# Patient Record
Sex: Female | Born: 1960
Health system: Southern US, Community
[De-identification: ages and names within clinical notes are randomized; demographics above are authoritative.]

## PROBLEM LIST (undated history)

## (undated) DIAGNOSIS — T7840XA Allergy, unspecified, initial encounter: Secondary | ICD-10-CM

## (undated) DIAGNOSIS — R634 Abnormal weight loss: Secondary | ICD-10-CM

## (undated) DIAGNOSIS — Z87442 Personal history of urinary calculi: Secondary | ICD-10-CM

## (undated) DIAGNOSIS — F32A Depression, unspecified: Secondary | ICD-10-CM

## (undated) DIAGNOSIS — F319 Bipolar disorder, unspecified: Secondary | ICD-10-CM

## (undated) DIAGNOSIS — K5900 Constipation, unspecified: Secondary | ICD-10-CM

## (undated) DIAGNOSIS — G5600 Carpal tunnel syndrome, unspecified upper limb: Secondary | ICD-10-CM

## (undated) DIAGNOSIS — K589 Irritable bowel syndrome without diarrhea: Secondary | ICD-10-CM

## (undated) DIAGNOSIS — E785 Hyperlipidemia, unspecified: Secondary | ICD-10-CM

## (undated) DIAGNOSIS — I10 Essential (primary) hypertension: Secondary | ICD-10-CM

## (undated) DIAGNOSIS — Z961 Presence of intraocular lens: Secondary | ICD-10-CM

## (undated) DIAGNOSIS — F419 Anxiety disorder, unspecified: Secondary | ICD-10-CM

## (undated) DIAGNOSIS — R945 Abnormal results of liver function studies: Secondary | ICD-10-CM

## (undated) DIAGNOSIS — M810 Age-related osteoporosis without current pathological fracture: Secondary | ICD-10-CM

## (undated) DIAGNOSIS — F329 Major depressive disorder, single episode, unspecified: Secondary | ICD-10-CM

## (undated) DIAGNOSIS — C801 Malignant (primary) neoplasm, unspecified: Secondary | ICD-10-CM

## (undated) DIAGNOSIS — R39198 Other difficulties with micturition: Secondary | ICD-10-CM

## (undated) DIAGNOSIS — K219 Gastro-esophageal reflux disease without esophagitis: Secondary | ICD-10-CM

## (undated) DIAGNOSIS — R079 Chest pain, unspecified: Secondary | ICD-10-CM

## (undated) DIAGNOSIS — M1990 Unspecified osteoarthritis, unspecified site: Secondary | ICD-10-CM

## (undated) DIAGNOSIS — I619 Nontraumatic intracerebral hemorrhage, unspecified: Secondary | ICD-10-CM

## (undated) DIAGNOSIS — I639 Cerebral infarction, unspecified: Secondary | ICD-10-CM

## (undated) HISTORY — DX: Irritable bowel syndrome, unspecified: K58.9

## (undated) HISTORY — DX: Abnormal weight loss: R63.4

## (undated) HISTORY — DX: Allergy, unspecified, initial encounter: T78.40XA

## (undated) HISTORY — DX: Depression, unspecified: F32.A

## (undated) HISTORY — DX: Nontraumatic intracerebral hemorrhage, unspecified: I61.9

## (undated) HISTORY — PX: TOTAL ABDOMINAL HYSTERECTOMY: SHX209

## (undated) HISTORY — DX: Hyperlipidemia, unspecified: E78.5

## (undated) HISTORY — DX: Gastro-esophageal reflux disease without esophagitis: K21.9

## (undated) HISTORY — DX: Chest pain, unspecified: R07.9

## (undated) HISTORY — DX: Anxiety disorder, unspecified: F41.9

## (undated) HISTORY — DX: Major depressive disorder, single episode, unspecified: F32.9

## (undated) HISTORY — PX: ABDOMINAL HYSTERECTOMY: SHX81

## (undated) HISTORY — DX: Other difficulties with micturition: R39.198

## (undated) HISTORY — PX: RECTOCELE REPAIR: SHX761

## (undated) HISTORY — DX: Malignant (primary) neoplasm, unspecified: C80.1

## (undated) HISTORY — PX: CERVICAL FUSION: SHX112

## (undated) HISTORY — DX: Abnormal results of liver function studies: R94.5

---

## 1997-07-04 DIAGNOSIS — I639 Cerebral infarction, unspecified: Secondary | ICD-10-CM

## 1997-07-04 HISTORY — DX: Cerebral infarction, unspecified: I63.9

## 1997-07-04 HISTORY — PX: BRAIN SURGERY: SHX531

## 1997-10-10 ENCOUNTER — Inpatient Hospital Stay (HOSPITAL_COMMUNITY): Admission: EM | Admit: 1997-10-10 | Discharge: 1997-10-14 | Payer: Self-pay | Admitting: Neurosurgery

## 1997-10-14 ENCOUNTER — Inpatient Hospital Stay (HOSPITAL_COMMUNITY)
Admission: RE | Admit: 1997-10-14 | Discharge: 1997-10-29 | Payer: Self-pay | Admitting: Physical Medicine and Rehabilitation

## 1997-12-02 ENCOUNTER — Ambulatory Visit (HOSPITAL_COMMUNITY): Admission: RE | Admit: 1997-12-02 | Discharge: 1997-12-02 | Payer: Self-pay | Admitting: Neurosurgery

## 1998-05-31 ENCOUNTER — Ambulatory Visit (HOSPITAL_COMMUNITY): Admission: RE | Admit: 1998-05-31 | Discharge: 1998-05-31 | Payer: Self-pay | Admitting: Neurosurgery

## 1998-05-31 ENCOUNTER — Encounter: Payer: Self-pay | Admitting: Neurosurgery

## 1998-06-05 ENCOUNTER — Encounter: Payer: Self-pay | Admitting: Neurosurgery

## 1998-06-05 ENCOUNTER — Ambulatory Visit (HOSPITAL_COMMUNITY): Admission: RE | Admit: 1998-06-05 | Discharge: 1998-06-05 | Payer: Self-pay | Admitting: Neurosurgery

## 1998-11-26 ENCOUNTER — Ambulatory Visit: Admission: RE | Admit: 1998-11-26 | Discharge: 1998-11-26 | Payer: Self-pay | Admitting: Neurosurgery

## 1998-12-01 ENCOUNTER — Emergency Department (HOSPITAL_COMMUNITY): Admission: EM | Admit: 1998-12-01 | Discharge: 1998-12-01 | Payer: Self-pay | Admitting: Emergency Medicine

## 1998-12-01 ENCOUNTER — Encounter: Payer: Self-pay | Admitting: Emergency Medicine

## 1999-10-28 ENCOUNTER — Ambulatory Visit (HOSPITAL_COMMUNITY): Admission: RE | Admit: 1999-10-28 | Discharge: 1999-10-28 | Payer: Self-pay | Admitting: Obstetrics and Gynecology

## 1999-10-28 ENCOUNTER — Encounter (INDEPENDENT_AMBULATORY_CARE_PROVIDER_SITE_OTHER): Payer: Self-pay

## 2003-04-24 ENCOUNTER — Inpatient Hospital Stay (HOSPITAL_COMMUNITY): Admission: RE | Admit: 2003-04-24 | Discharge: 2003-04-25 | Payer: Self-pay | Admitting: Neurosurgery

## 2003-04-24 ENCOUNTER — Encounter: Payer: Self-pay | Admitting: Neurosurgery

## 2004-01-29 ENCOUNTER — Encounter: Admission: RE | Admit: 2004-01-29 | Discharge: 2004-01-29 | Payer: Self-pay | Admitting: Internal Medicine

## 2004-02-12 ENCOUNTER — Other Ambulatory Visit: Admission: RE | Admit: 2004-02-12 | Discharge: 2004-02-12 | Payer: Self-pay | Admitting: Internal Medicine

## 2005-03-10 ENCOUNTER — Other Ambulatory Visit: Admission: RE | Admit: 2005-03-10 | Discharge: 2005-03-10 | Payer: Self-pay | Admitting: Family Medicine

## 2005-03-14 ENCOUNTER — Encounter: Admission: RE | Admit: 2005-03-14 | Discharge: 2005-03-14 | Payer: Self-pay | Admitting: Family Medicine

## 2005-06-08 ENCOUNTER — Encounter: Admission: RE | Admit: 2005-06-08 | Discharge: 2005-06-08 | Payer: Self-pay | Admitting: Obstetrics and Gynecology

## 2005-06-13 ENCOUNTER — Encounter: Admission: RE | Admit: 2005-06-13 | Discharge: 2005-06-13 | Payer: Self-pay | Admitting: Family Medicine

## 2005-09-06 ENCOUNTER — Other Ambulatory Visit: Admission: RE | Admit: 2005-09-06 | Discharge: 2005-09-06 | Payer: Self-pay | Admitting: Obstetrics and Gynecology

## 2006-01-03 ENCOUNTER — Ambulatory Visit (HOSPITAL_COMMUNITY): Admission: RE | Admit: 2006-01-03 | Discharge: 2006-01-03 | Payer: Self-pay | Admitting: Obstetrics and Gynecology

## 2006-03-20 ENCOUNTER — Other Ambulatory Visit: Admission: RE | Admit: 2006-03-20 | Discharge: 2006-03-20 | Payer: Self-pay | Admitting: Obstetrics and Gynecology

## 2006-07-17 ENCOUNTER — Ambulatory Visit (HOSPITAL_COMMUNITY): Admission: RE | Admit: 2006-07-17 | Discharge: 2006-07-17 | Payer: Self-pay | Admitting: Obstetrics and Gynecology

## 2006-10-26 ENCOUNTER — Ambulatory Visit (HOSPITAL_COMMUNITY): Admission: RE | Admit: 2006-10-26 | Discharge: 2006-10-26 | Payer: Self-pay | Admitting: Family Medicine

## 2007-03-19 ENCOUNTER — Other Ambulatory Visit: Admission: RE | Admit: 2007-03-19 | Discharge: 2007-03-19 | Payer: Self-pay | Admitting: Obstetrics and Gynecology

## 2007-06-14 ENCOUNTER — Ambulatory Visit (HOSPITAL_COMMUNITY): Admission: RE | Admit: 2007-06-14 | Discharge: 2007-06-14 | Payer: Self-pay | Admitting: Obstetrics and Gynecology

## 2007-06-21 ENCOUNTER — Inpatient Hospital Stay (HOSPITAL_COMMUNITY): Admission: RE | Admit: 2007-06-21 | Discharge: 2007-06-22 | Payer: Self-pay | Admitting: Obstetrics and Gynecology

## 2007-06-21 ENCOUNTER — Encounter: Payer: Self-pay | Admitting: Obstetrics and Gynecology

## 2008-06-18 ENCOUNTER — Ambulatory Visit (HOSPITAL_COMMUNITY): Admission: RE | Admit: 2008-06-18 | Discharge: 2008-06-18 | Payer: Self-pay | Admitting: Obstetrics and Gynecology

## 2009-06-19 ENCOUNTER — Ambulatory Visit (HOSPITAL_COMMUNITY): Admission: RE | Admit: 2009-06-19 | Discharge: 2009-06-19 | Payer: Self-pay | Admitting: Obstetrics and Gynecology

## 2009-07-02 ENCOUNTER — Ambulatory Visit (HOSPITAL_COMMUNITY): Admission: RE | Admit: 2009-07-02 | Discharge: 2009-07-02 | Payer: Self-pay | Admitting: Obstetrics and Gynecology

## 2010-08-25 ENCOUNTER — Other Ambulatory Visit: Payer: Self-pay | Admitting: Obstetrics and Gynecology

## 2010-08-25 DIAGNOSIS — Z139 Encounter for screening, unspecified: Secondary | ICD-10-CM

## 2010-08-26 ENCOUNTER — Ambulatory Visit (HOSPITAL_COMMUNITY)
Admission: RE | Admit: 2010-08-26 | Discharge: 2010-08-26 | Disposition: A | Discharge status: Home / Self Care | Payer: 59 | Source: Ambulatory Visit | Attending: Obstetrics and Gynecology | Admitting: Obstetrics and Gynecology

## 2010-08-26 DIAGNOSIS — Z139 Encounter for screening, unspecified: Secondary | ICD-10-CM

## 2010-08-26 DIAGNOSIS — Z1231 Encounter for screening mammogram for malignant neoplasm of breast: Secondary | ICD-10-CM | POA: Insufficient documentation

## 2010-11-16 NOTE — H&P (Signed)
Diamond, Santiago NO.:  [id number]   MEDICAL RECORD NO.:  [id number]          PATIENT TYPE:  AMB   LOCATION:  DAY                           FACILITY:  [facility]   PHYSICIAN:  Jonnie Kind, M.D. DATE OF BIRTH:  Oct 18, 1960   DATE OF ADMISSION:  DATE OF DISCHARGE:  LH                              HISTORY & PHYSICAL   For admission to [facility] June 21, 2007.   ADMITTING DIAGNOSES:  1. Dysmenorrhea, status post failed endometrial ablation.  2. History of recurrent abnormal Pap smear grade 1 despite rectocele.   HISTORY OF PRESENT ILLNESS:  This is a 50 year old, nulliparous female  who is admitted for vaginal hysterectomy and posterior repair.  She has  incapacitating dysmenorrhea despite the fact that we did an endometrial  ablation a couple years ago which shortened her periods but still left  her with 2 days of incapacitating pain at the beginning of each period.  She also has a very pronounced rectocele despite the face she has never  delivered vaginally and strains a lot for defecation due to the position  of the stool.  She splints the perineum to help with evacuation but  wishes to eliminate this.  Rectocele has been reviewed as a helpful  corrective procedure.  Absolute guarantees cannot be given as to how  exactly how effective the rectocele repair will be.  Family history is  significant for an aunt with ovarian cancer and no other family members  with a history of ovarian cancer and no breast cancer patterns in her  family.  Ovary preservation is planned.   PAST MEDICAL HISTORY:  History of colitis.  Also a history of left  hemiparetic stroke in 1997 with full recovery.  The paralysis effected  the left side of the body and as stated, has recovered.   SURGICAL HISTORY:  Negative other than a tubal ligation and endometrial  ablation.   ALLERGIES:  None but says she cannot tolerate PHENERGAN as it makes her  agitated.    MEDICATIONS:  1. Effexor.  2. Wellbutrin.  3. Lorazepam 0.5 mg q.a.m.  4. Trazodone at bedtime.  5. She takes a blood pressure medicine and cholesterol medicine, names      unknown.  The patient is instructed to bring these to the hospital.   PHYSICAL EXAMINATION:  VITAL SIGNS:  Weight 140.6, blood pressure  134/72.  HEENT:  Pupils equal, round, and reactive.  Extraocular movements  intact.  NECK:  Supple.  CHEST:  Clear to auscultation.  ABDOMEN:  Nontender.  EXTERNAL GENITALIA:  Nulliparous female with __________  relaxation  considered adequate to access the small uterus per vagina.  Rectocele is  present above a normal sphincter with digital rectal exam showing the  rectocele to greater than 90 degrees.   IMPRESSION:  1. Pelvic pain dysmenorrhea, status post failed endometrial ablation.  2. Rectocele.  3. Status post stroke 1997 secondary to right cranial hemorrhage with      left hemiparesis and full recovery.   PLAN:  Vaginal hysterectomy and posterior repair June 21, 2007.  Jonnie Kind, M.D.  Electronically Signed     JVF/MEDQ  D:  06/18/2007  T:  06/19/2007  Job:  [id number]

## 2010-11-16 NOTE — Op Note (Signed)
[name], [name]              ACCOUNT NO.:  [id number]   MEDICAL RECORD NO.:  [id number]          PATIENT TYPE:  INP   LOCATION:  A312                          FACILITY:  [facility]   PHYSICIAN:  Jonnie Kind, M.D. DATE OF BIRTH:  04/02/1961   DATE OF PROCEDURE:  06/21/2007  DATE OF DISCHARGE:                               OPERATIVE REPORT   PREOPERATIVE DIAGNOSES:  1. Dysmenorrhea status post failed endometrial ablation.  2. Rectocele.   POSTOPERATIVE DIAGNOSES:  1. Dysmenorrhea status post failed endometrial ablation.  2. Rectocele.   PROCEDURE:  Vaginal hysterectomy, posterior repair.   SURGEON:  Jonnie Kind, MD   ASSISTANT:  None.   ANESTHESIA:  General.   COMPLICATIONS:  None.   FINDINGS:  Small uterus, well supported.  Tendency to ooze on the  posterior vaginal cuff, controlled.  Rectocele.   DESCRIPTION OF PROCEDURE:  The patient was taken to the operating room,  prepped and draped for vaginal procedure with legs supported in high  lithotomy candy cane stirrups.  The cervix was circumscribed after  prepping and draping using Bovie cautery after having infiltrated  beneath the bladder with Marcaine with epinephrine.  The posterior  colpotomy incision was performed, identifying the posterior cul-de-sac.  The weighted speculum was placed into the vagina, the 3-inch, and the  uterosacral ligaments were taken down separately, clamped, cut, and  suture ligated, and tagged for future orientation.  The lower and upper  cardinal ligaments were then serially clamped, cut, and suture ligated  on either side of the bladder flap and then elevated.  The vesicouterine  peritoneum was not entered until reaching the level of the upper  cardinal ligaments.  We then marched up the broad ligament serially,  clamping, cutting, and suture ligating only the side.  Hemostasis was  quite good during this portion of the case.  Pedicles involving the  round ligament, fallopian tube,  and ovary were hemostatic.  Long  weighted speculum was replaced with a shorter speculum at this point and  the vaginal cuff noted some tendency to ooze that was addressed during  cuff closure.  First the anterior bladder flap peritoneum was tacked x1  stitch to the back of the vaginal cuff just above the cuff itself, and  the rest of the vaginal cuff was closed with interrupted suture sites in  the sagittal plane with good tissue edge approximation and support.  EBL  100 mL.  Sponge and needle counts correct.   Posterior fourchette was then grasped with Allis clamps, elevated, and  an incision made just inside the hymen remnants, elevating the vaginal  mucosa.  This was split in the midline for a distance of about 3 cm, and  while maintaining the operator's right index finger double-gloved in the  rectum, we were able to elevate the vaginal mucosa and dissect laterally  sharply to mobilize the vaginal mucosa.  Allis clamps were used to  identify good supportive tissues on either side.  The weakness appeared  to be on the right side of the midline and more tissue could be  identified on the  left side of the dissection.  A series of 3  interrupted mattress sutures of 0 Dexon were used to hold the  paravaginal supportive tissues together, rebuilding perineal body and  reducing the rectocele bulge.  After confirmation that at no time was  there any suspicion of bowel injury, the right index finger was  extracted using sterile technique, gloves changed, and then we proceeded  with closing vaginal mucosa without any trimming of tissue of the  vaginal mucosa itself, using interrupted 2-0 Prolene sutures.  Hemostasis at this point was excellent and not packing was necessary in  the vagina or perineum.  Foley catheter was inserted, obtaining some  somewhat cloudy urine.  The patient received antibiotics preprocedure.  Will be kept overnight with pain medicines ordered.      Jonnie Kind,  M.D.  Electronically Signed     JVF/MEDQ  D:  06/21/2007  T:  06/21/2007  Job:  [id number]   cc:   [facility]

## 2010-11-19 NOTE — H&P (Signed)
[name], BRANIFF              ACCOUNT NO.:  [id number]   MEDICAL RECORD NO.:  [id number]          PATIENT TYPE:  AMB   LOCATION:  [place]                           FACILITY:  [place]   PHYSICIAN:  Christophe Louis, MD          DATE OF BIRTH:  1961-01-15   DATE OF ADMISSION:  01/03/2006  DATE OF DISCHARGE:                                HISTORY & PHYSICAL   HISTORY OF PRESENT ILLNESS:  This is a 50 year old, G0, with a history of  menorrhagia.  She has a history of CVA and is not a candidate for any  estrogen therapy.  Discussed progestin therapies as well as a Mirena IUD and  ablation.  The patient desires endometrial ablation with ThermaChoice.   GYNECOLOGIC HISTORY:  History of condyloma and also history of abnormal Pap  smears, CIN-1.   PAST MEDICAL HISTORY:  1.  Hypertension.  2.  Hypercholesterolemia.  3.  Anxiety.  4.  History of stroke.  5.  CVA with residual left-sided weakness.   PAST SURGICAL HISTORY:  1.  Neck surgery in August 2005.  2.  Tube ligation in the 1980s.  3.  Cranial surgery for drainage of hematoma 1998.   MEDICATIONS:  1.  Hydrochlorothiazide 25 mg p.o. q.d.  2.  Altace 2.5 one p.o. q.d.  3.  Trazodone 50 mg one at bedtime.  4.  Lipitor  mg one daily.  5.  Effexor 150 mg one per day.  6.  Lorazepam 0.5 mg one daily.  7.  Potassium supplement.  8.  Wellbutrin 300 mg daily.   ALLERGIES:  PHENERGAN.   SOCIAL HISTORY:  The patient is married.  She denies tobacco, alcohol, or  illicit drug use.   FAMILY HISTORY:  No family history of breast, ovarian, or colon cancer.   REVIEW OF SYSTEMS:  Negative except as stated in history of current illness.   PHYSICAL EXAMINATION:  VITAL SIGNS: Blood pressure was 116/86, heart rate  80.  CARDIOVASCULAR: Regular rate and rhythm.  LUNGS: Clear to auscultation bilaterally.  ABDOMEN:  Soft, nontender, nondistended.  No masses.  EXTREMITIES:  No clubbing, cyanosis or edema.  2+ reflexes.  PELVIC EXAM:  Normal external  female genitalia.  No vulvar or vaginal  cervical lesions noted on bimanual exam. No cervical motion tenderness.  No  adnexal masses or tenderness.   LAB RESULTS:  Endometrial biopsy done in my office in November 2006 showed  benign secretory endometrium. No hyperplasia or malignancy was identified.   IMPRESSION AND PLAN:  Menorrhagia. Plan for endometrial ablation using  ThermaChoice. The risks, benefits, and alternatives of the procedure were  discussed with the patient including but not limited to infection, bleeding,  damage to the uterus, perforation, with the need for further surgery, the  risk of transfusion, HIV, hepatitis B and C were discussed.  The patient  understands all risks and desires to proceed with endometrial ablation with  ThermaChoice.      Christophe Louis, MD  Electronically Signed     TC/MEDQ  D:  12/22/2005  T:  12/22/2005  Job:  [id number]

## 2010-11-19 NOTE — Discharge Summary (Signed)
NAMEENID, MAULTSBY              ACCOUNT NO.:  [id number]   MEDICAL RECORD NO.:  [id number]          PATIENT TYPE:  INP   LOCATION:  A312                          FACILITY:  [facility]   PHYSICIAN:  Jonnie Kind, M.D. DATE OF BIRTH:  09-Dec-1960   DATE OF ADMISSION:  06/21/2007  DATE OF DISCHARGE:  12/19/2008LH                               DISCHARGE SUMMARY   ADMISSION DIAGNOSES:  1. Dysmenorrhea, rectocele, failed endometrial ablation.  2. History of cranial hemorrhage 1997.   DISCHARGE DIAGNOSES:  1. Dysmenorrhea, rectocele, failed endometrial ablation.  2. History of cranial hemorrhage 1997.  3. Depression, anxiety disorder.   PROCEDURE:  On June 21, 2007, vaginal hysterectomy and posterior  repair.   HISTORY OF PRESENT ILLNESS:  This 46-year gravida 0, para 0 was admitted  for vaginal hysterectomy, posterior repair after incapacitating  dysmenorrhea, treated 2 years ago with endometrial ablation with  reduction of menstrual flow but no significant change in pelvic pain.   HOSPITAL COURSE:  The patient was admitted, underwent vaginal  hysterectomy, posterior repair with 100 mL blood loss.  The patient had  a straightforward 24-hour postop observation with discharge hemoglobin  12.6, hematocrit 36.5 compared to 13.3 and 39 on admission.  BUN was 12,  creatinine 0.82, a GFR greater than 60.   Pathology report showed a 51-gram uterus, benign secretory endometrium,  indicating incomplete endometrial ablation results.  There was no  evidence of adenomyosis documented.  Postoperatively the patient was  discharged home, resumed her prior med list, and will be followed up in  1 week and then 4 weeks in our office.  With the patient noting urinary  retention day one, was sent home with a Foley catheter to be kept in for  48 hours and then removed at home.   DISCHARGE MEDICATIONS:  1. Effexor 75 mg twice daily.  2. Wellbutrin 300 mg daily.  3. Lorazepam 0.5 mg in the a.m.  daily.  4. Vitamin E daily, multivitamin daily, vitamin B complex daily.  5. Trazodone 25 mg q.h.s.  6. Lisinopril 20 mg daily.  7. Flexeril 10 mg p.r.n. discomfort and muscle spasm.  8. Meloxicam 15 mg daily.  9. Simvastatin 20 mg q.h.s.   Additional medications given included:  1. Toradol 10 mg every 6 hours p.r.n. cramps.  2. Percocet 5/325 one every six hours p.r.n. pain, dispensed 20.  3. Colace 100 mg b.i.d. x 30 days.      Jonnie Kind, M.D.  Electronically Signed     JVF/MEDQ  D:  07/12/2007  T:  07/12/2007  Job:  [id number]

## 2010-11-19 NOTE — Op Note (Signed)
[facility]  Patient:    [name], [name]                       MRN: [id number] Proc. Date: 10/28/99 Adm. Date:  [id number] Attending:  Richarda Blade                           Operative Report  PREOPERATIVE DIAGNOSIS:       Right Bartholins gland cyst.  POSTOPERATIVE DIAGNOSIS:  OPERATION:                    Marsupialization of right Bartholins gland cyst.  SURGEON:                      S. Olena Mater, M.D.  ASSISTANT:  ANESTHESIA:  ESTIMATED BLOOD LOSS:  DESCRIPTION OF PROCEDURE:     The patient was placed in the lithotomy position nd prepped and draped in the usual fashion.  A small Bartholins cyst was identified. I attempted to enter the cyst through the vaginal aspect and was unable to find the cyst wall satisfactorily, so I made an incision at the vaginal labial junction nd found the cyst quite easily.  I opened the cyst and drained it vaginally as well as at the labial opening.  Part of the cyst wall was sent for study.  The vaginal kin was closed with interrupted sutures of 4-0 Vicryl.  Hemostasis was accomplished  with 3-0 and 4-0 Vicryl as well as Bovie.  Versa tolerated this procedure well nd injected about 10 cc of Marcaine in the incision. DD:  10/28/99 TD:  10/29/99 Job: [id number]/[id number]

## 2010-11-19 NOTE — Op Note (Signed)
[name], VANROSSUM                        ACCOUNT NO.:  [id number]   MEDICAL RECORD NO.:  [id number]                   PATIENT TYPE:  INP   LOCATION:  2899                                 FACILITY:  [place]   PHYSICIAN:  Hosie Spangle, M.D.            DATE OF BIRTH:  1961/05/29   DATE OF PROCEDURE:  04/24/2003  DATE OF DISCHARGE:                                 OPERATIVE REPORT   PREOPERATIVE DIAGNOSIS:  C5-6 cervical disk herniation, degenerative disk  disease, spondylosis and radiculopathy.   POSTOPERATIVE DIAGNOSIS:  C5-6 cervical disk herniation, degenerative disk  disease, spondylosis and radiculopathy.   OPERATION PERFORMED:  C5-6 anterior cervical diskectomy and arthrodesis with  iliac crest allograft and Trinica cervical plating.   SURGEON:  Hosie Spangle, M.D.   ASSISTANT:  Earleen Newport, M.D.   ANESTHESIA:  General endotracheal.   INDICATIONS FOR PROCEDURE:  The patient is a 50 year old woman who presented  with neck pain and cervical radiculopathy.  She was found to have a  spondylitic disk herniation at C5-6 superimposed upon underlying  degenerative disk disease and spondylosis.  Decision was made to proceed  with decompression and arthrodesis.   DESCRIPTION OF PROCEDURE:  The patient was brought to the operating room and  placed under general endotracheal anesthesia.  The patient was placed in 10  pounds of halter traction.  The neck was prepped with Betadine soap and  solution and draped in sterile fashion.  A horizontal incision was made on  the left side of the neck.  The line of incision was infiltrated with local  anesthetic with epinephrine.  Incision was made and carried down through the  subcutaneous tissue, bipolar cautery and electrocautery were used to  maintain hemostasis.  Dissection was carried down to the platysma which was  divided and then through an avascular plane leaving the sternocleidomastoid  carotid artery and jugular  vein laterally and the trachea and esophagus  medially.  The ventral aspect of the vertebral column was identified and  localizing x-ray taken.  The C5-6 intervertebral disk space identified.  Diskectomy was begun with incision of the annulus, continued with  microcurettes and pituitary rongeurs.  The cartilaginous end plates of the  corresponding vertebrae were removed using microcurettes as well as the  MicroMax drill.  The operating microscope was draped and brought into the  field to provide additional magnification, illumination and visualization  and the remainder of the decompression was performed using microdissection  and microsurgical technique.  Posterior osteophytic overgrowth was removed  using the MicroMax drill along with a 2 mm Kerrison punch with a thin foot  plate.  The posterior longitudinal ligament which was thickened was  carefully removed and the disk herniation was removed.  We then  removed  spondylitic overgrowth encroaching upon the foramina bilaterally and we were  able to decompress the thecal sac and spinal cord as well as the foramina  and nerve roots bilaterally.  Once the decompression was completed, Gelfoam  with Thrombin was used to help establish hemostasis and then we selected an  8 mm graft of iliac crest tricortical allograft.  The edges were slightly  shaved down and we were able to position the intervertebral graft in the  disk space.  We had sized the disk space prior to selecting the graft.  The  graft itself had been hydrated in saline.  Once the graft was positioned and  countersunk, we selected a 24 mm Trinica cervical plate.  This was  positioned over the fusion construct and secured to each of the vertebrae  with a pair of 4.2 x 14 mm screws. Each screw hole was drilled and tapped  and the screws placed in alternating fashion.  All four screws were placed  and the locking system was secured.  We then irrigated the wound with  bacitracin  solution and checked for hemostasis which was established and  confirmed and proceeded with closure. An x-ray was taken which showed the  graft, plate and screws in good position.  The alignment was good.  The  platysma was closed with interrupted inverted 2-0 undyed Vicryl sutures and  the subcutaneous and subcuticular layer were closed with interrupted  inverted 3-0 undyed Vicryl sutures and the skin edges were approximated with  Dermabond.  The patient tolerated the procedure well.  The estimated blood  loss for this procedure was 44m.  Sponge and instrument counts were  correct.  Following surgery, the patient was to be reversed from anesthetic,  extubated and transferred to the recovery room for further care.  She was  placed in a soft collar and in the recovery room she was noted to be moving  all four extremities to command.                                                RHosie Spangle M.D.    RWN/MEDQ  D:  04/24/2003  T:  04/24/2003  Job:  [id number]

## 2010-11-19 NOTE — Op Note (Signed)
[name], [name]              ACCOUNT NO.:  [id number]   MEDICAL RECORD NO.:  [id number]          PATIENT TYPE:  AMB   LOCATION:  [place]                           FACILITY:  [place]   PHYSICIAN:  Christophe Louis, MD          DATE OF BIRTH:  07-Oct-1960   DATE OF PROCEDURE:  01/03/2006  DATE OF DISCHARGE:                                 OPERATIVE REPORT   PREOPERATIVE DIAGNOSIS:  Menorrhagia.   POSTOPERATIVE DIAGNOSIS:  Menorrhagia.   PROCEDURE:  ThermaChoice endometrial ablation.   SURGEON:  Christophe Louis, M.D.   ASSISTANT:  None.   ANESTHESIA:  General.   SPECIMEN:  None.   ESTIMATED BLOOD LOSS:  Minimal.   COMPLICATIONS:  None.   FINDINGS:  Uterus sounded to approximately 8 cm.   PROCEDURE:  The patient was taken to the operating room where she was placed  under general anesthesia, placed in dorsal lithotomy position.  She was then  prepped in the usual sterile fashion.  Bladder was drained with in-and-out  catheter.  She was then draped using sterile fashion.  Bivalve speculum  placed in the vaginal vault.  The anterior lip of the cervix was grasped  with single-tooth vacuum.  The uterus was sounded to approximately 8 cm.  The endometrial ablation was performed after the catheter was primed  negative 150.  It was then inserted into the uterus to approximately 7.5 cm.  Pressure was obtained using D5W to 160.  This stabilized and the ablation  was performed at 87 degrees Celsius for eight minutes.  Once that the  therapy cycle was completed, the catheter was allowed to cool down and was  then removed from the patient's uterus and all other instruments were  removed from the vaginal vault.  Excellent hemostasis was noted.  The  patient was taken to recovery room awake and in stable condition.      Christophe Louis, MD  Electronically Signed     TC/MEDQ  D:  01/03/2006  T:  01/03/2006  Job:  [id number]

## 2010-11-26 ENCOUNTER — Other Ambulatory Visit (HOSPITAL_COMMUNITY): Payer: 59

## 2010-12-03 ENCOUNTER — Other Ambulatory Visit: Payer: Self-pay | Admitting: Obstetrics and Gynecology

## 2010-12-03 ENCOUNTER — Encounter (HOSPITAL_COMMUNITY): Payer: 59

## 2010-12-03 LAB — CBC
HCT: 42.1 % (ref 36.0–46.0)
Hemoglobin: 14.5 g/dL (ref 12.0–15.0)
MCH: 32 pg (ref 26.0–34.0)
MCHC: 34.4 g/dL (ref 30.0–36.0)
MCV: 92.9 fL (ref 78.0–100.0)
Platelets: 273 10*3/uL (ref 150–400)
RBC: 4.53 MIL/uL (ref 3.87–5.11)
RDW: 12.1 % (ref 11.5–15.5)
WBC: 5.3 10*3/uL (ref 4.0–10.5)

## 2010-12-03 LAB — DIFFERENTIAL
Basophils Absolute: 0 10*3/uL (ref 0.0–0.1)
Basophils Relative: 0 % (ref 0–1)
Eosinophils Absolute: 0.1 10*3/uL (ref 0.0–0.7)
Eosinophils Relative: 2 % (ref 0–5)
Lymphocytes Relative: 32 % (ref 12–46)
Lymphs Abs: 1.7 10*3/uL (ref 0.7–4.0)
Monocytes Absolute: 0.4 10*3/uL (ref 0.1–1.0)
Monocytes Relative: 8 % (ref 3–12)
Neutro Abs: 3.1 10*3/uL (ref 1.7–7.7)
Neutrophils Relative %: 58 % (ref 43–77)

## 2010-12-03 LAB — BASIC METABOLIC PANEL
BUN: 18 mg/dL (ref 6–23)
CO2: 31 mEq/L (ref 19–32)
Calcium: 11.1 mg/dL — ABNORMAL HIGH (ref 8.4–10.5)
Chloride: 101 mEq/L (ref 96–112)
Creatinine, Ser: 1.03 mg/dL (ref 0.4–1.2)
GFR calc Af Amer: >60 mL/min (ref >60)
GFR calc non Af Amer: 57 mL/min — ABNORMAL LOW (ref >60)
Glucose, Bld: 94 mg/dL (ref 70–99)
Potassium: 4.4 mEq/L (ref 3.5–5.1)
Sodium: 140 mEq/L (ref 135–145)

## 2010-12-03 LAB — SURGICAL PCR SCREEN
MRSA, PCR: NEGATIVE
Staphylococcus aureus: NEGATIVE

## 2010-12-08 ENCOUNTER — Ambulatory Visit (HOSPITAL_COMMUNITY)
Admission: RE | Admit: 2010-12-08 | Discharge: 2010-12-08 | Disposition: A | Discharge status: Home / Self Care | Payer: 59 | Source: Ambulatory Visit | Attending: Obstetrics and Gynecology | Admitting: Obstetrics and Gynecology

## 2010-12-08 DIAGNOSIS — N816 Rectocele: Secondary | ICD-10-CM | POA: Insufficient documentation

## 2010-12-08 DIAGNOSIS — Z79899 Other long term (current) drug therapy: Secondary | ICD-10-CM | POA: Insufficient documentation

## 2010-12-08 DIAGNOSIS — I10 Essential (primary) hypertension: Secondary | ICD-10-CM | POA: Insufficient documentation

## 2010-12-08 DIAGNOSIS — Z01812 Encounter for preprocedural laboratory examination: Secondary | ICD-10-CM | POA: Insufficient documentation

## 2010-12-08 DIAGNOSIS — Z01818 Encounter for other preprocedural examination: Secondary | ICD-10-CM | POA: Insufficient documentation

## 2010-12-08 NOTE — H&P (Signed)
  [name], [name] NO.:  [id number]  MEDICAL RECORD NO.:  [id number]  LOCATION:  [place]                          FACILITY:  [facility]  PHYSICIAN:  Jonnie Kind, M.D. DATE OF BIRTH:  09-02-1960  DATE OF ADMISSION:  12/08/2010 DATE OF DISCHARGE:  LH                             HISTORY & PHYSICAL   ADMISSION DIAGNOSES: 1. Recurrent rectocele. 2. History of cranial hemorrhage in 1997 without sequelae.  HISTORY OF PRESENT ILLNESS:  This 50 year old female status post vaginal hysterectomy and posterior repair on June 21, 2007 is admitted at this time for recurrent repair of rectocele.  There has been posterior rotation and descent of the posterior rectal vault which is requiring the patient to use stool softeners and MiraLax and still having prolonged effort to defecate.  This has been followed since 2011 and after consideration, the patient wishes to have [place] reattempt repair as the symptoms have become more inconvenient.  She originally considered having it done last year and decided to hold off.  The patient is aware that surgery will be technically more challenging due to the presence of scar tissue from prior procedure.  The risks of the procedure including vaginal length loss, potential for injury to bowel including potential of entering the bowel during the procedure have been discussed with the patient.  She understands this and wishes to proceed with efforts at repairing the recurrent defect.  The patient is aware that we do not intend to using mesh since it is in the rectum area and would run the risk of fistulization.  PAST MEDICAL HISTORY:  Positive for a history of colitis.  Also history of a left hemiparetic stroke in 1997 with full recovery.  PAST SURGICAL HISTORY:  Tubal ligation, endometrial ablation, vaginal hysterectomy, posterior repair in 2008.  ALLERGIES/INTOLERANCES:  None.  PHENERGAN makes her agitated, we will use Zofran.  SOCIAL  HISTORY:  She is a nonsmoker, rare alcohol use.  Denies marijuana or other recreational drugs.  PHYSICAL EXAMINATION:  GENERAL:  She is a slim Caucasian female. VITAL SIGNS:  Weight 158.2, blood pressure 136/88, pulse 70s. HEENT:  Pupils equal, round, and reactive.  Extraocular movements intact. NECK:  Supple. CHEST:  Clear to auscultation. ABDOMEN:  Nontender without masses. EXTERNAL GENITALIA:  Multiparous.  Vaginal exam, normal secretions. Cervix and uterus are absent.  Vaginal length is adequate.  There is downward descent and rotation of the posterior vaginal wall.  There appears to be a transverse defect above the anal sphincter.  The anal sphincter is intact. EXTREMITIES:  Without cyanosis, clubbing, or edema.  MEDICATION LIST:  Lisinopril, trazodone, cyclobenzaprine, Celebrex, lorazepam, simvastatin, venlafaxine, Women's prenatal vitamins, black cohosh, vitamin E, B complex, Evening Primrose.  PLAN:  Posterior repair, December 08, 2010.     Jonnie Kind, M.D.     JVF/MEDQ  D:  12/07/2010  T:  12/08/2010  Job:  [id number] cc:   [facility]  Electronically Signed by Mallory Shirk M.D. on 12/08/2010 05:39:55 PM

## 2010-12-17 NOTE — Op Note (Signed)
[name], [name] NO.:  [id number]  MEDICAL RECORD NO.:  [id number]  LOCATION:  [facility]                          FACILITY:  [facility]  PHYSICIAN:  Jonnie Kind, M.D. DATE OF BIRTH:  11-13-1960  DATE OF PROCEDURE:  12/08/2010 DATE OF DISCHARGE:                              OPERATIVE REPORT   PREOPERATIVE DIAGNOSIS:  Rectocele.  POSTOPERATIVE DIAGNOSIS:  Rectocele.  PROCEDURE:  Posterior repair.  SURGEON:  Jonnie Kind, MD  ASSISTANT:  None.  ANESTHESIA:  General with LMA anesthesia.  COMPLICATIONS:  None.  ESTIMATED BLOOD LOSS:  25 mL.  FINDINGS:  Low rectal pouch, just inside perineal body, recurrent.  INDICATIONS:  A 50 year old female with recurrent difficulty with defecation despite stool softeners including docusate and MiraLax.  The patient will be admitted for surgical reshaping of posterior perineal wall.  Risk of procedure including potential complications such as infection, injury to bowel, or recurrence of symptoms over time were discussed with the patient.  DETAILS OF THE PROCEDURE:  The patient was taken to the operating room, prepped and draped for vaginal procedure with legs supported in low lithotomy support, Yellofin tight leg supports.  Time-out was conducted. All involved parties agreed on procedure planned.  Ancef 1 g IV was administered preoperatively.  The perineum was prepped and draped with vaginal bib in place.  Digital rectal exam with a double gloved left index finger revealed the site of the defect and showed incomplete evacuation of the bowel despite previous comments by the patient, indicating clear fluid per rectum prior to the procedure.  Particularly care was taken to avoid contamination of the surgical field, working beneath the vaginal bib.  After changing gloves, we then proceeded with grasping the posterior fourchette at 5 and 7 o'clock with Allis clamps, injecting beneath the vaginal epithelium with 10  mL of Marcaine with epinephrine and splitting the posterior perineal vaginal tissues from the hymen remnants upward approximately 3 cm and dissecting laterally on either side.  Good, strong, well-attached supportive tissues were found on either side and pulled in the midline with a series of 4 or 5 horizontal mattress sutures which upon completion gave a firm tissue support, but still allowed normal vaginal diameter.  The vaginal epithelium was then pulled over this with 2-0 Vicryl.  There was a slight bit of oozing, so in-and- out catheterization was performed to evacuate 200 mL of urine and a small vaginal packing placed to be left in 30 minutes and removed in recovery.  The patient will be allowed to go home.  Discharge pain medicines include oxycodone 30 tablets one every 6 hours p.r.n. pain along with Motrin which she has already.  Additionally, ciprofloxacin 500 b.i.d. x7 days is added as a precaution.  Home medications that will be continued include: 1. Allegra 180 mg by mouth daily. 2. Celebrex 200 mg by mouth daily (other NSAIDs to be avoided). 3. Cyclobenzaprine 10 mg by mouth daily at bedtime. 4. Docusate 50 mg 2 tablets at bedtime. 5. Lorazepam 0.5-mg tablet by mouth q.a.m. 6. Polyethylene glycol 17 g by mouth every morning. 7. Simvastatin 20 mg by mouth daily at bedtime. 8. Trazodone 50 mg by mouth at  bedtime. 9. Venlafaxine 75 mg by mouth daily.     Jonnie Kind, M.D.     JVF/MEDQ  D:  12/08/2010  T:  12/09/2010  Job:  [id number]  Electronically Signed by Mallory Shirk M.D. on 12/17/2010 07:32:35 PM

## 2011-04-08 LAB — CBC
HCT: 36.5
HCT: 39
Hemoglobin: 12.6
Hemoglobin: 13.3
MCHC: 34.2
MCHC: 34.3
MCV: 94.8
MCV: 94.9
Platelets: 307
Platelets: 316
RBC: 3.86 — ABNORMAL LOW
RBC: 4.11
RDW: 12.4
RDW: 12.5
WBC: 6.7
WBC: 9.6

## 2011-04-08 LAB — DIFFERENTIAL
Basophils Absolute: 0
Basophils Relative: 0
Eosinophils Absolute: 0.2
Eosinophils Relative: 2
Lymphocytes Relative: 20
Lymphs Abs: 1.9
Monocytes Absolute: 0.7
Monocytes Relative: 7
Neutro Abs: 6.7
Neutrophils Relative %: 70

## 2011-04-08 LAB — HCG, QUANTITATIVE, PREGNANCY: hCG, Beta Chain, Quant, S: <2

## 2011-04-08 LAB — BASIC METABOLIC PANEL
BUN: 12
CO2: 32
Calcium: 9.6
Chloride: 100
Creatinine, Ser: 0.82
GFR calc Af Amer: >60
GFR calc non Af Amer: >60
Glucose, Bld: 104 — ABNORMAL HIGH
Potassium: 4
Sodium: 138

## 2011-04-08 LAB — TYPE AND SCREEN
ABO/RH(D): AB NEG
Antibody Screen: NEGATIVE

## 2011-07-14 ENCOUNTER — Ambulatory Visit (HOSPITAL_COMMUNITY)
Admission: RE | Admit: 2011-07-14 | Discharge: 2011-07-14 | Disposition: A | Discharge status: Home / Self Care | Payer: No Typology Code available for payment source | Source: Ambulatory Visit | Attending: Family Medicine | Admitting: Family Medicine

## 2011-07-14 ENCOUNTER — Other Ambulatory Visit (HOSPITAL_COMMUNITY): Payer: Self-pay | Admitting: Family Medicine

## 2011-07-14 DIAGNOSIS — R05 Cough: Secondary | ICD-10-CM

## 2011-07-14 DIAGNOSIS — R0989 Other specified symptoms and signs involving the circulatory and respiratory systems: Secondary | ICD-10-CM | POA: Insufficient documentation

## 2011-07-14 DIAGNOSIS — R059 Cough, unspecified: Secondary | ICD-10-CM | POA: Insufficient documentation

## 2011-07-27 ENCOUNTER — Other Ambulatory Visit: Payer: Self-pay | Admitting: Obstetrics and Gynecology

## 2011-07-27 DIAGNOSIS — Z139 Encounter for screening, unspecified: Secondary | ICD-10-CM

## 2011-08-29 ENCOUNTER — Ambulatory Visit (HOSPITAL_COMMUNITY)
Admission: RE | Admit: 2011-08-29 | Discharge: 2011-08-29 | Disposition: A | Discharge status: Home / Self Care | Payer: No Typology Code available for payment source | Source: Ambulatory Visit | Attending: Obstetrics and Gynecology | Admitting: Obstetrics and Gynecology

## 2011-08-29 DIAGNOSIS — Z1231 Encounter for screening mammogram for malignant neoplasm of breast: Secondary | ICD-10-CM | POA: Insufficient documentation

## 2011-08-29 DIAGNOSIS — Z139 Encounter for screening, unspecified: Secondary | ICD-10-CM

## 2011-09-02 DIAGNOSIS — R079 Chest pain, unspecified: Secondary | ICD-10-CM

## 2011-09-02 HISTORY — DX: Chest pain, unspecified: R07.9

## 2011-09-20 ENCOUNTER — Inpatient Hospital Stay (HOSPITAL_COMMUNITY)
Admission: EM | Admit: 2011-09-20 | Discharge: 2011-09-24 | DRG: 287 | Disposition: A | Discharge status: Home / Self Care | Payer: No Typology Code available for payment source | Attending: Cardiology | Admitting: Cardiology

## 2011-09-20 ENCOUNTER — Other Ambulatory Visit: Payer: Self-pay

## 2011-09-20 ENCOUNTER — Emergency Department (HOSPITAL_COMMUNITY): Payer: No Typology Code available for payment source

## 2011-09-20 ENCOUNTER — Encounter (HOSPITAL_COMMUNITY): Payer: Self-pay | Admitting: *Deleted

## 2011-09-20 DIAGNOSIS — R29898 Other symptoms and signs involving the musculoskeletal system: Secondary | ICD-10-CM | POA: Diagnosis present

## 2011-09-20 DIAGNOSIS — I679 Cerebrovascular disease, unspecified: Secondary | ICD-10-CM

## 2011-09-20 DIAGNOSIS — R0789 Other chest pain: Secondary | ICD-10-CM | POA: Diagnosis present

## 2011-09-20 DIAGNOSIS — I200 Unstable angina: Secondary | ICD-10-CM

## 2011-09-20 DIAGNOSIS — E782 Mixed hyperlipidemia: Secondary | ICD-10-CM | POA: Diagnosis present

## 2011-09-20 DIAGNOSIS — E669 Obesity, unspecified: Secondary | ICD-10-CM | POA: Diagnosis present

## 2011-09-20 DIAGNOSIS — I10 Essential (primary) hypertension: Principal | ICD-10-CM | POA: Diagnosis present

## 2011-09-20 DIAGNOSIS — F419 Anxiety disorder, unspecified: Secondary | ICD-10-CM | POA: Diagnosis present

## 2011-09-20 DIAGNOSIS — Z79899 Other long term (current) drug therapy: Secondary | ICD-10-CM

## 2011-09-20 DIAGNOSIS — F411 Generalized anxiety disorder: Secondary | ICD-10-CM | POA: Diagnosis present

## 2011-09-20 DIAGNOSIS — E785 Hyperlipidemia, unspecified: Secondary | ICD-10-CM | POA: Diagnosis present

## 2011-09-20 DIAGNOSIS — Z981 Arthrodesis status: Secondary | ICD-10-CM

## 2011-09-20 DIAGNOSIS — R079 Chest pain, unspecified: Secondary | ICD-10-CM

## 2011-09-20 DIAGNOSIS — Z87891 Personal history of nicotine dependence: Secondary | ICD-10-CM

## 2011-09-20 DIAGNOSIS — I69998 Other sequelae following unspecified cerebrovascular disease: Secondary | ICD-10-CM

## 2011-09-20 DIAGNOSIS — R51 Headache: Secondary | ICD-10-CM

## 2011-09-20 HISTORY — DX: Essential (primary) hypertension: I10

## 2011-09-20 HISTORY — DX: Cerebral infarction, unspecified: I63.9

## 2011-09-20 LAB — BASIC METABOLIC PANEL
BUN: 10 mg/dL (ref 6–23)
CO2: 27 mEq/L (ref 19–32)
Calcium: 9.9 mg/dL (ref 8.4–10.5)
Chloride: 100 mEq/L (ref 96–112)
Creatinine, Ser: 0.94 mg/dL (ref 0.50–1.10)
GFR calc Af Amer: 82 mL/min — ABNORMAL LOW (ref >90)
GFR calc non Af Amer: 71 mL/min — ABNORMAL LOW (ref >90)
Glucose, Bld: 97 mg/dL (ref 70–99)
Potassium: 3.9 mEq/L (ref 3.5–5.1)
Sodium: 138 mEq/L (ref 135–145)

## 2011-09-20 LAB — CBC
HCT: 43.5 % (ref 36.0–46.0)
Hemoglobin: 15.1 g/dL — ABNORMAL HIGH (ref 12.0–15.0)
MCH: 31.7 pg (ref 26.0–34.0)
MCHC: 34.7 g/dL (ref 30.0–36.0)
MCV: 91.2 fL (ref 78.0–100.0)
Platelets: 319 10*3/uL (ref 150–400)
RBC: 4.77 MIL/uL (ref 3.87–5.11)
RDW: 11.9 % (ref 11.5–15.5)
WBC: 4.8 10*3/uL (ref 4.0–10.5)

## 2011-09-20 LAB — CARDIAC PANEL(CRET KIN+CKTOT+MB+TROPI)
CK, MB: 1.2 ng/mL (ref 0.3–4.0)
Relative Index: INVALID (ref 0.0–2.5)
Total CK: 40 U/L (ref 7–177)
Troponin I: 0.5 ng/mL (ref <0.30)

## 2011-09-20 LAB — DIFFERENTIAL
Basophils Absolute: 0 10*3/uL (ref 0.0–0.1)
Basophils Relative: 0 % (ref 0–1)
Eosinophils Absolute: 0.1 10*3/uL (ref 0.0–0.7)
Eosinophils Relative: 3 % (ref 0–5)
Lymphocytes Relative: 38 % (ref 12–46)
Lymphs Abs: 1.8 10*3/uL (ref 0.7–4.0)
Monocytes Absolute: 0.5 10*3/uL (ref 0.1–1.0)
Monocytes Relative: 10 % (ref 3–12)
Neutro Abs: 2.4 10*3/uL (ref 1.7–7.7)
Neutrophils Relative %: 49 % (ref 43–77)

## 2011-09-20 LAB — TROPONIN I: Troponin I: 0.54 ng/mL (ref <0.30)

## 2011-09-20 MED ORDER — METOPROLOL TARTRATE 1 MG/ML IV SOLN
5.0000 mg | Freq: Once | INTRAVENOUS | Status: AC
  Administered 2011-09-20: 5 mg via INTRAVENOUS
  Filled 2011-09-20 (×2): qty 5

## 2011-09-20 MED ORDER — VENLAFAXINE HCL 75 MG PO TABS
75.0000 mg | ORAL_TABLET | Freq: Every day | ORAL | Status: DC
  Administered 2011-09-21 – 2011-09-22 (×2): 75 mg via ORAL
  Filled 2011-09-20 (×3): qty 1
  Filled 2011-09-20: qty 2

## 2011-09-20 MED ORDER — ALUM & MAG HYDROXIDE-SIMETH 200-200-20 MG/5ML PO SUSP
15.0000 mL | ORAL | Status: DC | PRN
  Administered 2011-09-20: 15 mL via ORAL
  Filled 2011-09-20 (×2): qty 30

## 2011-09-20 MED ORDER — ONDANSETRON HCL 4 MG PO TABS: 4.0000 mg | ORAL_TABLET | Freq: Four times a day (QID) | ORAL | Status: DC | PRN

## 2011-09-20 MED ORDER — SIMVASTATIN 20 MG PO TABS
20.0000 mg | ORAL_TABLET | Freq: Every day | ORAL | Status: DC
  Administered 2011-09-20 – 2011-09-23 (×4): 20 mg via ORAL
  Filled 2011-09-20 (×5): qty 1

## 2011-09-20 MED ORDER — HEPARIN (PORCINE) IN NACL 100-0.45 UNIT/ML-% IJ SOLN
950.0000 [IU]/h | INTRAMUSCULAR | Status: DC
  Administered 2011-09-20: 1000 [IU]/h via INTRAVENOUS
  Filled 2011-09-20: qty 250

## 2011-09-20 MED ORDER — ACETAMINOPHEN 325 MG PO TABS
650.0000 mg | ORAL_TABLET | Freq: Four times a day (QID) | ORAL | Status: DC | PRN
  Administered 2011-09-20 – 2011-09-22 (×6): 650 mg via ORAL
  Filled 2011-09-20 (×8): qty 2

## 2011-09-20 MED ORDER — SODIUM CHLORIDE 0.9 % IV BOLUS (SEPSIS)
500.0000 mL | Freq: Once | INTRAVENOUS | Status: AC
  Administered 2011-09-20: 1000 mL via INTRAVENOUS

## 2011-09-20 MED ORDER — LORAZEPAM 0.5 MG PO TABS
0.5000 mg | ORAL_TABLET | Freq: Every day | ORAL | Status: DC
  Administered 2011-09-21 – 2011-09-22 (×2): 0.5 mg via ORAL
  Filled 2011-09-20 (×2): qty 1

## 2011-09-20 MED ORDER — NITROGLYCERIN 0.4 MG SL SUBL: 0.4000 mg | SUBLINGUAL_TABLET | SUBLINGUAL | Status: DC | PRN

## 2011-09-20 MED ORDER — ONDANSETRON HCL 4 MG/2ML IJ SOLN: 4.0000 mg | Freq: Four times a day (QID) | INTRAMUSCULAR | Status: DC | PRN

## 2011-09-20 MED ORDER — KETOROLAC TROMETHAMINE 30 MG/ML IJ SOLN
30.0000 mg | Freq: Once | INTRAMUSCULAR | Status: AC
  Administered 2011-09-20: 30 mg via INTRAVENOUS
  Filled 2011-09-20: qty 1

## 2011-09-20 MED ORDER — NITROGLYCERIN 2 % TD OINT
0.5000 [in_us] | TOPICAL_OINTMENT | Freq: Four times a day (QID) | TRANSDERMAL | Status: DC
  Administered 2011-09-20 – 2011-09-21 (×4): 0.5 [in_us] via TOPICAL
  Filled 2011-09-20 (×5): qty 1

## 2011-09-20 MED ORDER — ADULT MULTIVITAMIN W/MINERALS CH
1.0000 | ORAL_TABLET | Freq: Every day | ORAL | Status: DC
  Administered 2011-09-21 – 2011-09-22 (×2): 1 via ORAL
  Filled 2011-09-20 (×4): qty 1

## 2011-09-20 MED ORDER — HEPARIN SODIUM (PORCINE) 5000 UNIT/ML IJ SOLN
5000.0000 [IU] | Freq: Three times a day (TID) | INTRAMUSCULAR | Status: DC
Start: 2011-09-20 — End: 2011-09-20

## 2011-09-20 MED ORDER — POLYETHYLENE GLYCOL 3350 17 G PO PACK
17.0000 g | PACK | Freq: Every day | ORAL | Status: DC
  Administered 2011-09-21 – 2011-09-24 (×3): 17 g via ORAL
  Filled 2011-09-20 (×4): qty 1

## 2011-09-20 MED ORDER — METOCLOPRAMIDE HCL 5 MG/ML IJ SOLN
10.0000 mg | Freq: Once | INTRAMUSCULAR | Status: AC
  Administered 2011-09-20: 10 mg via INTRAVENOUS
  Filled 2011-09-20: qty 2

## 2011-09-20 MED ORDER — ASPIRIN EC 81 MG PO TBEC
81.0000 mg | DELAYED_RELEASE_TABLET | Freq: Every day | ORAL | Status: DC
  Administered 2011-09-20 – 2011-09-22 (×3): 81 mg via ORAL
  Filled 2011-09-20 (×4): qty 1

## 2011-09-20 MED ORDER — ONDANSETRON HCL 4 MG/2ML IJ SOLN
4.0000 mg | Freq: Once | INTRAMUSCULAR | Status: AC
  Administered 2011-09-20: 4 mg via INTRAVENOUS
  Filled 2011-09-20: qty 2

## 2011-09-20 MED ORDER — SODIUM CHLORIDE 0.9 % IV SOLN: INTRAVENOUS | Status: DC

## 2011-09-20 MED ORDER — PANTOPRAZOLE SODIUM 40 MG PO TBEC
40.0000 mg | DELAYED_RELEASE_TABLET | Freq: Every day | ORAL | Status: DC
  Administered 2011-09-20 – 2011-09-22 (×3): 40 mg via ORAL
  Filled 2011-09-20 (×3): qty 1

## 2011-09-20 MED ORDER — HEPARIN BOLUS VIA INFUSION
4000.0000 [IU] | Freq: Once | INTRAVENOUS | Status: AC
  Administered 2011-09-20: 4000 [IU] via INTRAVENOUS
  Filled 2011-09-20: qty 4000

## 2011-09-20 MED ORDER — ASPIRIN 81 MG PO CHEW
324.0000 mg | CHEWABLE_TABLET | Freq: Once | ORAL | Status: AC
  Administered 2011-09-20: 324 mg via ORAL
  Filled 2011-09-20 (×2): qty 4

## 2011-09-20 MED ORDER — SODIUM CHLORIDE 0.9 % IJ SOLN
3.0000 mL | Freq: Two times a day (BID) | INTRAMUSCULAR | Status: DC
  Administered 2011-09-20 – 2011-09-22 (×2): 3 mL via INTRAVENOUS
  Filled 2011-09-20: qty 3

## 2011-09-20 MED ORDER — TRAZODONE HCL 50 MG PO TABS
50.0000 mg | ORAL_TABLET | Freq: Every day | ORAL | Status: DC
  Administered 2011-09-20 – 2011-09-23 (×4): 50 mg via ORAL
  Filled 2011-09-20 (×5): qty 1

## 2011-09-20 MED ORDER — CYCLOBENZAPRINE HCL 10 MG PO TABS
10.0000 mg | ORAL_TABLET | Freq: Three times a day (TID) | ORAL | Status: DC | PRN
Start: 2011-09-20 — End: 2011-09-24
  Administered 2011-09-22 – 2011-09-23 (×3): 10 mg via ORAL
  Filled 2011-09-20 (×3): qty 1

## 2011-09-20 MED ORDER — NITROGLYCERIN 0.4 MG SL SUBL
0.4000 mg | SUBLINGUAL_TABLET | Freq: Once | SUBLINGUAL | Status: AC
  Administered 2011-09-20: 0.4 mg via SUBLINGUAL
  Filled 2011-09-20 (×2): qty 25

## 2011-09-20 NOTE — ED Notes (Addendum)
Chest pain since yesterday , hypertension Headache

## 2011-09-20 NOTE — ED Notes (Signed)
Meal tray ordered for pt  

## 2011-09-20 NOTE — H&P (Signed)
[name] MRN: [id number] DOB/AGE: 1961/06/18 51 y.o. Primary Care Physician:MANN,BENJAMIN L, PA, PA-C Admit date: 09/20/2011 Chief Complaint: Chest pain. HPI: This 51 year old lady, who has a history of hyperlipidemia, previous stroke 14 years ago and obesity, presents with the above symptom. It started yesterday evening and is dull in nature. It has been lasting constantly without any waxing and waning. There is no associated nausea, sweating or dyspnea. The pain does not radiate. She also has an associated headache. She checked her blood pressure because of the headache and it was elevated, this prompted her to come to the emergency room. In the emergency room, initial evaluation revealed an elevated troponin level. Electrocardiogram does not show any ST segment elevations but does show T wave changes laterally, which are nonspecific.  Past Medical History  Diagnosis Date  . Stroke   . Hypertension    Past Surgical History  Procedure Date  . Abdominal hysterectomy   . Cervical fusion   . Rectocele repair         History reviewed. No pertinent family history.  Social History: She has been married for the last 6 years. She quit smoking when she had her stroke at the age of 51. She does not drink alcohol.  Family history: There is no family history of early coronary artery disease.  Allergies:  Allergies  Allergen Reactions  . Morphine And Related Hives  . Phenergan     Causes patient to become Hyper    Medications Prior to Admission  Medication Dose Route Frequency Provider Last Rate Last Dose  . aspirin chewable tablet 324 mg  324 mg Oral Once Nat Christen, MD      . ketorolac (TORADOL) 30 MG/ML injection 30 mg  30 mg Intravenous Once Nat Christen, MD   30 mg at 09/20/11 1603  . metoCLOPramide (REGLAN) injection 10 mg  10 mg Intravenous Once Nat Christen, MD   10 mg at 09/20/11 1603  . metoprolol (LOPRESSOR) injection 5 mg  5 mg Intravenous Once Nat Christen, MD      .  nitroGLYCERIN (NITROSTAT) SL tablet 0.4 mg  0.4 mg Sublingual Once Nat Christen, MD      . ondansetron [facility]) injection 4 mg  4 mg Intravenous Once Nat Christen, MD   4 mg at 09/20/11 1604  . sodium chloride 0.9 % bolus 500 mL  500 mL Intravenous Once Nat Christen, MD   1,000 mL at 09/20/11 1603  . DISCONTD: 0.9 %  sodium chloride infusion   Intravenous Continuous Nat Christen, MD       No current outpatient prescriptions on file as of 09/20/2011.       TLX:BWIOM from the symptoms mentioned above,there are no other symptoms referable to all systems reviewed.  Physical Exam: Blood pressure 129/74, pulse 76, temperature 98.2 F (36.8 C), temperature source Oral, resp. rate 15, height 5' 4"  (1.626 m), weight 74.844 kg (165 lb), SpO2 97.00%. She looks systemically well. She is obese. Heart sounds are present and normal without murmurs or rubs. There is no gallop rhythm. Jugular venous pressure not raised. Lung fields are clear. There is no chest wall tenderness reproducing her pain. She is alert and orientated without any focal neuro signs. Her abdomen is soft and nontender. There are no masses felt. There is no hepatosplenomegaly. There are no skin changes which are significantly abnormal.    Basename 09/20/11 1504  WBC 4.8  NEUTROABS 2.4  HGB 15.1*  HCT 43.5  MCV 91.2  PLT  319    Basename 09/20/11 1504  NA 138  K 3.9  CL 100  CO2 27  GLUCOSE 97  BUN 10  CREATININE 0.94  CALCIUM 9.9  MG --         Dg Chest 2 View  09/20/2011  *RADIOLOGY REPORT*  Clinical Data: Chest pain, elevated blood pressure  CHEST - 2 VIEW  Comparison: 07/14/2011  Findings: Normal heart size, mediastinal contours, and pulmonary vascularity. Minimal chronic bibasilar atelectasis versus scarring. Underlying emphysematous changes. No acute infiltrate, pleural effusion, or pneumothorax. Bones appear demineralized. Atherosclerotic calcification aortic arch. Prior cervical spine fusion.  IMPRESSION: Emphysematous  changes with minimal chronic bibasilar atelectasis versus scarring. No acute abnormalities.  Original Report Authenticated By: Burnetta Sabin, M.D.   Mm Digital Screening  08/30/2011  DG SCREEN MAMMOGRAM BILATERAL Bilateral CC and MLO view(s) were taken.  DIGITAL SCREENING MAMMOGRAM WITH CAD: There are scattered fibroglandular densities.  No masses or malignant type calcifications are  identified.  Compared with prior studies.  Images were processed with CAD.  IMPRESSION: No specific mammographic evidence of malignancy.  Next screening mammogram is recommended in one  year.  A result letter of this screening mammogram will be mailed directly to the patient.  ASSESSMENT: Negative - BI-RADS 1  Screening mammogram in 1 year. ,   Impression: 1. Chest pain at rest, unclear etiology. Clinically, I do not think this is cardiac in origin. 2. Hyperlipidemia. 3. Obesity. 4. Anxiety disorder. 5. History of CVA 14 years ago.     Plan: 1. Admit to telemetry. 2. Serial cardiac enzymes and ECGs. 3. Cardiology consultation. Further recommendations will depend on patient's hospital progress.     Doree Albee Pager [phone]  09/20/2011, 5:27 PM

## 2011-09-20 NOTE — ED Notes (Signed)
Pt c/o chest pain off and on since yesterday, pain is sharp in nature at times, sometimes pain is associated with sob and elevated blood pressure.

## 2011-09-20 NOTE — Progress Notes (Signed)
ANTICOAGULATION CONSULT NOTE - Initial Consult  Pharmacy Consult for Heparin Indication: chest pain/ACS possible Non-STEMI  Allergies  Allergen Reactions  . Morphine And Related Hives  . Phenergan     Causes patient to become Hyper    Patient Measurements: Height: 5' 4"  (162.6 cm) Weight: 164 lb 15.9 oz (74.84 kg) IBW/kg (Calculated) : 54.7  Heparin Dosing Weight: 71  Vital Signs: Temp: 98.3 F (36.8 C) (03/19 2020) Temp src: Oral (03/19 1556) BP: 104/70 mmHg (03/19 2020) Pulse Rate: 62  (03/19 2020)  Labs:  Basename 09/20/11 1918 09/20/11 1504  HGB -- 15.1*  HCT -- 43.5  PLT -- 319  APTT -- --  LABPROT -- --  INR -- --  HEPARINUNFRC -- --  CREATININE -- 0.94  CKTOTAL 40 --  CKMB 1.2 --  TROPONINI 0.50* 0.54*   Estimated Creatinine Clearance: 72.4 ml/min (by C-G formula based on Cr of 0.94).  Medical History: Past Medical History  Diagnosis Date  . Hypertension   . Stroke     Medications:  Scheduled:    . aspirin  324 mg Oral Once  . aspirin EC  81 mg Oral Daily  . ketorolac  30 mg Intravenous Once  . LORazepam  0.5 mg Oral Daily  . metoCLOPramide (REGLAN) injection  10 mg Intravenous Once  . metoprolol  5 mg Intravenous Once  . mulitivitamin with minerals  1 tablet Oral Daily  . nitroGLYCERIN  0.5 inch Topical Q6H  . nitroGLYCERIN  0.4 mg Sublingual Once  . ondansetron  4 mg Intravenous Once  . polyethylene glycol  17 g Oral Daily  . simvastatin  20 mg Oral QHS  . sodium chloride  500 mL Intravenous Once  . sodium chloride  3 mL Intravenous Q12H  . traZODone  50 mg Oral QHS  . venlafaxine  75 mg Oral Daily  . DISCONTD: heparin  5,000 Units Subcutaneous Q8H    Assessment: Okay for Protocol  Goal of Therapy:  Heparin level 0.3-0.7 units/ml   Plan:  Heparin Load 4000 units. Heparin Drip 1000 units/hr. CBC/PLTC monitoring per protocol. Heparin Level 6-8 hours after starting drip. PT/INR in AM.  Pricilla Larsson 09/20/2011,8:53 PM

## 2011-09-20 NOTE — ED Notes (Signed)
Pt states that the nitro and toradol may have helped "some " on her right side of her chest, pt continues to have pain on left side, nitro offered to pt, pt states that she will wait, offer made to contact hospitalist for additional pain medication, pt states " I will wait a little bit longer"./

## 2011-09-20 NOTE — ED Notes (Signed)
MD at bedside. 

## 2011-09-20 NOTE — ED Notes (Signed)
CRITICAL VALUE ALERT  Critical value received:  Trop 0.54  Date of notification:  09/20/2011  Time of notification:  16:27  Critical value read back: yes  Nurse who received alert:  Rip Harbour, RN   MD notified (1st page):  Dr. Lacinda Axon   Time of first page:  16:27  MD notified (2nd page):  Time of second page:  Responding MD:  Dr. Lacinda Axon   Time MD responded:  16:27

## 2011-09-20 NOTE — ED Notes (Signed)
Pt eating meal tray 

## 2011-09-20 NOTE — ED Provider Notes (Signed)
History  Scribed for Nat Christen, MD, the patient was seen in room APA02/APA02 . This chart was scribed by Truddie Coco. The patient's care started at 3:17 PM    CSN: [id number]  Arrival date & time 09/20/11  1435   First MD Initiated Contact with Patient 09/20/11 1508      Chief Complaint  Patient presents with  . Hypertension     The history is provided by the patient.   [name] is a 51 y.o. female who presents to the Emergency Department complaining of elevated blood pressure that started last night with the onset of a headache.  She reports that she checked her blood pressure at that point and reports it was 146/96.  She also states that she has been experiencing dull chest pains since yesterday with occasional sharp pains.  Pt has a h/o hypertension and states that she has not taken blood pressure meds for one year due to her ability to manage her BP.  She was seen by urgent care and advised to be seen in the ED.  She has h/o high cholesterol, stroke, and anxiety.  She denies any other sx. Level V caveat for for intervention   Past Medical History  Diagnosis Date  . Stroke   . Hypertension     Past Surgical History  Procedure Date  . Abdominal hysterectomy   . Cervical fusion   . Rectocele repair     History reviewed. No pertinent family history.  History  Substance Use Topics  . Smoking status: Never Smoker   . Smokeless tobacco: Not on file  . Alcohol Use:     OB History    Grav Para Term Preterm Abortions TAB SAB Ect Mult Living                  Review of Systems  Cardiovascular: Positive for chest pain.  Neurological: Positive for headaches.  All other systems reviewed and are negative.    Allergies  Morphine and related and Phenergan  Home Medications  No current outpatient prescriptions on file.  BP 148/96  Pulse 86  Temp(Src) 98 F (36.7 C) (Oral)  Resp 20  Ht 5' 4"  (1.626 m)  Wt 165 lb (74.844 kg)  BMI 28.32 kg/m2  SpO2  99%  Physical Exam  Nursing note and vitals reviewed. Constitutional: She is oriented to person, place, and time. She appears well-developed and well-nourished. No distress.  HENT:  Head: Normocephalic and atraumatic.  Eyes: EOM are normal. Right eye exhibits no discharge. Left eye exhibits no discharge.  Neck: Normal range of motion. Neck supple.  Pulmonary/Chest: Effort normal. No respiratory distress.  Abdominal: She exhibits no mass. There is no tenderness.  Musculoskeletal: Normal range of motion. She exhibits no tenderness.  Neurological: She is alert and oriented to person, place, and time.  Skin: Skin is warm and dry. She is not diaphoretic.  Psychiatric: She has a normal mood and affect. Her behavior is normal.    ED Course  Procedures  DIAGNOSTIC STUDIES: Oxygen Saturation is 99% on room air, normal by my interpretation.    COORDINATION OF CARE:  3:15PM Ordered: DG Chest 2 View   Labs Reviewed  CBC - Abnormal; Notable for the following:    Hemoglobin 15.1 (*)    All other components within normal limits  BASIC METABOLIC PANEL - Abnormal; Notable for the following:    GFR calc non Af Amer 71 (*)    GFR calc Af Amer 82 (*)  All other components within normal limits  TROPONIN I - Abnormal; Notable for the following:    Troponin I 0.54 (*)    All other components within normal limits  DIFFERENTIAL   Dg Chest 2 View  09/20/2011  *RADIOLOGY REPORT*  Clinical Data: Chest pain, elevated blood pressure  CHEST - 2 VIEW  Comparison: 07/14/2011  Findings: Normal heart size, mediastinal contours, and pulmonary vascularity. Minimal chronic bibasilar atelectasis versus scarring. Underlying emphysematous changes. No acute infiltrate, pleural effusion, or pneumothorax. Bones appear demineralized. Atherosclerotic calcification aortic arch. Prior cervical spine fusion.  IMPRESSION: Emphysematous changes with minimal chronic bibasilar atelectasis versus scarring. No acute  abnormalities.  Original Report Authenticated By: Burnetta Sabin, M.D.     No diagnosis found.  Date: 09/20/2011  Rate: 81  Rhythm: normal sinus rhythm  QRS Axis: normal  Intervals: normal  ST/T Wave abnormalities: T wave inversion inferior leads appear to be old compared to 06/19/2007 EKG  Conduction Disutrbances:none  Narrative Interpretation:   Old EKG Reviewed: changes noted   MDM  I personally performed the services described in this documentation, which was scribed in my presence. The recorded information has been reviewed and considered.  Patient complains of headache, hypertension, chest pain. EKG was normal. Troponin 0.54. Patient is hemodynamically stable. Rx aspirin, nitroglycerin, beta blocker. Admit to general medicine. No evidence of acute MI on EKG.       Nat Christen, MD 09/20/11 1721

## 2011-09-20 NOTE — Progress Notes (Signed)
  Called by RN as Patient was complaining of CP  Trop mildly elevated. EKG shows T wave flattening.  Patient states pain comes and goes. But also having burning sensation suggestive of indigestion which she gets commonly and take Prilosec for same.  Will give patient Protonix and Maalox but also start heparin gtt. NTP  Will monitor closely.  Lakyia Behe 10:41 PM

## 2011-09-20 NOTE — ED Notes (Signed)
Dr. Anastasio Champion here to evaluate pt for admission.

## 2011-09-21 ENCOUNTER — Encounter (HOSPITAL_COMMUNITY): Payer: Self-pay | Admitting: Adult Health

## 2011-09-21 DIAGNOSIS — R079 Chest pain, unspecified: Secondary | ICD-10-CM

## 2011-09-21 LAB — COMPREHENSIVE METABOLIC PANEL
ALT: 24 U/L (ref 0–35)
AST: 22 U/L (ref 0–37)
Albumin: 3.5 g/dL (ref 3.5–5.2)
Alkaline Phosphatase: 65 U/L (ref 39–117)
BUN: 12 mg/dL (ref 6–23)
CO2: 28 mEq/L (ref 19–32)
Calcium: 9.5 mg/dL (ref 8.4–10.5)
Chloride: 103 mEq/L (ref 96–112)
Creatinine, Ser: 1 mg/dL (ref 0.50–1.10)
GFR calc Af Amer: 76 mL/min — ABNORMAL LOW (ref >90)
GFR calc non Af Amer: 66 mL/min — ABNORMAL LOW (ref >90)
Glucose, Bld: 105 mg/dL — ABNORMAL HIGH (ref 70–99)
Potassium: 4.3 mEq/L (ref 3.5–5.1)
Sodium: 138 mEq/L (ref 135–145)
Total Bilirubin: 0.4 mg/dL (ref 0.3–1.2)
Total Protein: 6.5 g/dL (ref 6.0–8.3)

## 2011-09-21 LAB — CARDIAC PANEL(CRET KIN+CKTOT+MB+TROPI)
CK, MB: 1.2 ng/mL (ref 0.3–4.0)
CK, MB: 1.2 ng/mL (ref 0.3–4.0)
Relative Index: INVALID (ref 0.0–2.5)
Relative Index: INVALID (ref 0.0–2.5)
Total CK: 41 U/L (ref 7–177)
Total CK: 39 U/L (ref 7–177)
Troponin I: 0.49 ng/mL (ref <0.30)
Troponin I: 0.49 ng/mL (ref <0.30)

## 2011-09-21 LAB — CBC
HCT: 38.6 % (ref 36.0–46.0)
Hemoglobin: 13.4 g/dL (ref 12.0–15.0)
MCH: 31.7 pg (ref 26.0–34.0)
MCHC: 34.7 g/dL (ref 30.0–36.0)
MCV: 91.3 fL (ref 78.0–100.0)
Platelets: 273 10*3/uL (ref 150–400)
RBC: 4.23 MIL/uL (ref 3.87–5.11)
RDW: 12 % (ref 11.5–15.5)
WBC: 5.6 10*3/uL (ref 4.0–10.5)

## 2011-09-21 LAB — PROTIME-INR
INR: 1 (ref 0.00–1.49)
Prothrombin Time: 13.4 seconds (ref 11.6–15.2)

## 2011-09-21 LAB — TSH: TSH: 1.438 u[IU]/mL (ref 0.350–4.500)

## 2011-09-21 LAB — MRSA PCR SCREENING: MRSA by PCR: NEGATIVE

## 2011-09-21 LAB — HEPARIN LEVEL (UNFRACTIONATED): Heparin Unfractionated: 0.8 IU/mL — ABNORMAL HIGH (ref 0.30–0.70)

## 2011-09-21 MED ORDER — NITROGLYCERIN 0.2 MG/HR TD PT24
0.2000 mg | MEDICATED_PATCH | Freq: Every day | TRANSDERMAL | Status: DC
  Administered 2011-09-22 (×2): 0.2 mg via TRANSDERMAL
  Filled 2011-09-21 (×3): qty 1

## 2011-09-21 MED ORDER — METOPROLOL TARTRATE 12.5 MG HALF TABLET
12.5000 mg | ORAL_TABLET | Freq: Two times a day (BID) | ORAL | Status: DC
  Administered 2011-09-21 – 2011-09-24 (×5): 12.5 mg via ORAL
  Filled 2011-09-21 (×7): qty 1

## 2011-09-21 NOTE — Progress Notes (Signed)
[place] for Heparin Indication: chest pain/ACS possible Non-STEMI  Allergies  Allergen Reactions  . Morphine And Related Hives  . Phenergan     Causes patient to become Hyper    Patient Measurements: Height: 5' 4"  (162.6 cm) Weight: 164 lb 15.9 oz (74.84 kg) IBW/kg (Calculated) : 54.7  Heparin Dosing Weight: 71  Vital Signs: Temp: 97.7 F (36.5 C) (03/20 0600) Temp src: Oral (03/20 0600) BP: 112/77 mmHg (03/20 0600) Pulse Rate: 75  (03/20 0600)  Labs:  Basename 09/21/11 0329 09/20/11 1918 09/20/11 1504  HGB 13.4 -- 15.1*  HCT 38.6 -- 43.5  PLT 273 -- 319  APTT -- -- --  LABPROT 13.4 -- --  INR 1.00 -- --  HEPARINUNFRC 0.80* -- --  CREATININE 1.00 -- 0.94  CKTOTAL 41 40 --  CKMB 1.2 1.2 --  TROPONINI 0.49* 0.50* 0.54*   Estimated Creatinine Clearance: 68.1 ml/min (by C-G formula based on Cr of 1).  Medical History: Past Medical History  Diagnosis Date  . Hypertension   . Stroke     Medications:  Scheduled:     . aspirin  324 mg Oral Once  . aspirin EC  81 mg Oral Daily  . heparin  4,000 Units Intravenous Once  . ketorolac  30 mg Intravenous Once  . LORazepam  0.5 mg Oral Daily  . metoCLOPramide (REGLAN) injection  10 mg Intravenous Once  . metoprolol  5 mg Intravenous Once  . mulitivitamin with minerals  1 tablet Oral Daily  . nitroGLYCERIN  0.5 inch Topical Q6H  . nitroGLYCERIN  0.4 mg Sublingual Once  . ondansetron  4 mg Intravenous Once  . pantoprazole  40 mg Oral Q1200  . polyethylene glycol  17 g Oral Daily  . simvastatin  20 mg Oral QHS  . sodium chloride  500 mL Intravenous Once  . sodium chloride  3 mL Intravenous Q12H  . traZODone  50 mg Oral QHS  . venlafaxine  75 mg Oral Daily  . DISCONTD: heparin  5,000 Units Subcutaneous Q8H    Assessment: Okay for Protocol Heparin level above goal this am  Goal of Therapy:  Heparin level 0.3-0.7 units/ml   Plan:  Taper Heparin Drip to 950  units/hr. CBC/PLTC monitoring per protocol. Heparin Level 6-8 hours after rate changes PT/INR in AM.  [place] Crane, Jenna Routzahn A 09/21/2011,9:02 AM

## 2011-09-21 NOTE — Consult Note (Signed)
CARDIOLOGY CONSULT NOTE  Patient ID: [name] MRN: [id number] DOB/AGE: 1960-08-31 51 y.o.  Admit date: 09/20/2011 Referring Physician: PTH Primary PhysicianMANN,BENJAMIN L, PA, PA-C Primary Cardiologist: New Reason for Consultation: Chest Pain  Principal Problem:  *Chest pain at rest Active Problems:  Obesity  Hyperlipidemia  Cerebrovascular disease  Anxiety  HPI: [name] is a 51 year old patient with no prior cardiac history but cardiovascular risk factors to include hypertension, hyperlipidemia, obesity. She has a history of a stroke in 1999 which she describes as a hemorrhagic stroke, due to a "weak blood vessel." She does have some residual left-sided weakness as a result of this. She was in her usual state of health until 3 days ago when she had a headache which would not go away. Usually when this happens she feels that her blood pressures elevated. She took her blood pressure and found it to be mildly elevated in the 324M to 010U systolic. She also began to have pressure behind her breasts which is described as a dull ache. He was not radiating. It did not cause any shortness of breath diarrhea for reasons or dizziness. Due to the persistence of the pressure in her chest and headache, she called to see her physicians through [facility]. She was unable to be seen, went to urgent care where she was referred to the ER. She began to describe some sharp intermittent pain which was new for her in the setting of a constant pressure in her chest. She also had some complaints of some intermittent heartburn. On presentation to the emergency room blood pressure was 140/96 with a heart rate of 86 and respirations of 20. She was treated with Toradol, Reglan, Zofran, nitroglycerin and aspirin and given a IV injection of metoprolol 5 mg. EKG was completed and found to have T wave flattening inferior, anterior, and laterally. She was placed on heparin, nitroglycerin paste, and admitted to rule out  myocardial infarction. Troponins have been cycled with initial troponin of 0.54, 0.50, and 0.49. She continues to have intermittent complaints of heartburn with intermittent sharp chest discomfort. The dull ache remains but is much less than when she was first admitted.  Review of systems complete and found to be negative unless listed above   Past Medical History  Diagnosis Date  . Hypertension   . Stroke     Family History  Problem Relation Age of Onset  . Coronary artery disease Paternal Grandfather   . Coronary artery disease Paternal Uncle     History   Social History  . Marital Status: Married    Spouse Name: N/A    Number of Children: N/A  . Years of Education: N/A   Occupational History  . Not on file.   Social History Main Topics  . Smoking status: Never Smoker   . Smokeless tobacco: Never Used  . Alcohol Use: No  . Drug Use: No  . Sexually Active:    Other Topics Concern  . Not on file   Social History Narrative   [place] in [place]    Past Surgical History  Procedure Date  . Abdominal hysterectomy   . Cervical fusion   . Rectocele repair      Prescriptions prior to admission  Medication Sig Dispense Refill  . cyclobenzaprine (FLEXERIL) 10 MG tablet Take 10 mg by mouth 3 (three) times daily as needed. For muscle spasms      . LORazepam (ATIVAN) 0.5 MG tablet Take 0.5 mg by mouth daily.      Marland Kitchen  Multiple Vitamin (MULITIVITAMIN WITH MINERALS) TABS Take 1 tablet by mouth daily.      . polyethylene glycol (MIRALAX / GLYCOLAX) packet Take 17 g by mouth daily.      . simvastatin (ZOCOR) 20 MG tablet Take 20 mg by mouth at bedtime. For cholesterol      . traZODone (DESYREL) 50 MG tablet Take 50 mg by mouth at bedtime.      Marland Kitchen venlafaxine (EFFEXOR) 75 MG tablet Take 75 mg by mouth daily.        Physical Exam: Blood pressure 112/77, pulse 75, temperature 97.7 F (36.5 C), temperature source Oral, resp. rate 20, height 5' 4"  (1.626 m), weight  164 lb 15.9 oz (74.84 kg), SpO2 100.00%.   General: Well developed, well nourished, in no acute distress Head: Eyes PERRLA, No xanthomas.   Normal cephalic and atramatic  Lungs: Clear bilaterally to auscultation and percussion. Heart: HRRR S1 S2, MRG.  Pulses are 2+ & equal.            No carotid bruit. No JVD.  No abdominal bruits. No femoral bruits. Abdomen: Bowel sounds are positive, abdomen soft and non-tender without masses or                  Hernia's noted. Msk:  Back normal, normal gait. Normal strength and tone for age. Extremities: No clubbing, cyanosis or edema.  DP +1 Neuro: Alert and oriented X 3. Psych:  Good affect, responds appropriately   Labs:   Lab Results  Component Value Date   WBC 5.6 09/21/2011   HGB 13.4 09/21/2011   HCT 38.6 09/21/2011   MCV 91.3 09/21/2011   PLT 273 09/21/2011     Lab 09/21/11 0329  NA 138  K 4.3  CL 103  CO2 28  BUN 12  CREATININE 1.00  CALCIUM 9.5  PROT 6.5  BILITOT 0.4  ALKPHOS 65  ALT 24  AST 22  GLUCOSE 105*   Lab Results  Component Value Date   CKTOTAL 41 09/21/2011   CKMB 1.2 09/21/2011   TROPONINI 0.49* 09/21/2011        Radiology: Dg Chest 2 View  09/20/2011  *RADIOLOGY REPORT*  Clinical Data: Chest pain, elevated blood pressure  CHEST - 2 VIEW  Comparison: 07/14/2011  Findings: Normal heart size, mediastinal contours, and pulmonary vascularity. Minimal chronic bibasilar atelectasis versus scarring. Underlying emphysematous changes. No acute infiltrate, pleural effusion, or pneumothorax. Bones appear demineralized. Atherosclerotic calcification aortic arch. Prior cervical spine fusion.  IMPRESSION: Emphysematous changes with minimal chronic bibasilar atelectasis versus scarring. No acute abnormalities.  Original Report Authenticated By: Burnetta Sabin, M.D.   EKG:NSR with T-wave flattening in all leads. Rate of 67 bpm  ASSESSMENT AND PLAN:   1. Chest Pain: Pain is typical and atypical in presentation with constant  pressure in her chest for 2 days with associated headache. She also has experienced an intermittent heartburn and sharp chest discomfort, which is new for her. The pain is nonradiating, not causing shortness of breath, diaphoresis, or dizziness. However, she is found to have positive cardiac enzymes, although not significantly high, and T wave abnormalities in all leads, which are found to be flat without depression or inversion. There is concern with her prior history of hemorrhagic stroke 13 years ago, about the need to proceed with dual antiplatelet therapy. Will need to discuss with Dr. Rita Ohara to ascertain if safe to have her on DAPT should this be necessary. Plan to move to [facility] for cath,  but have neurosurgeon see her first. This has been discussed at length with the patient by Dr. Domenic Polite,  who verbalizes understanding and is willing to proceed.   Signed: Phill Myron. Purcell Nails NP [facility] 09/21/2011, 8:50 AM Co-Sign MD  Attending note:  Patient seen and examined. Reviewed available records as well as database recorded above the Ms. Lawrence. Ms. Borchard is a 50 year old woman with history of hypertension, hyperlipidemia on statin therapy, obesity, also reported hemorrhagic stroke in 1999. Details are not available to me at this time, however the patient states that she was seen by Dr. Rita Ohara due to a "weak blood vessel" requiring surgical intervention at that time, presumably a craniotomy. She has some mild residual left-sided weakness, otherwise states that she has had no recurrent events over time.  She presents to the hospital now with recent symptoms of intermittent headache, findings of elevated blood pressure (has not been on antihypertensives for a least a year), and also intermittent chest pain. She has had both typical and atypical symptoms including sharp sensation in her chest, more recently a "dull ache" that has been more persistent. She reports no prior history of CAD or  microinfarction.  She was admitted to the hospitalist service, has been treated with aspirin and heparin by their team, continues to have intermittent chest pain. ECG reviewed showing nonspecific T-wave changes, no definite acute ST segment abnormalities. Cardiac markers have trended abnormally with troponin I of 0.54, 0.50, and 0.49. Otherwise CK-MB has been normal. Chest x-ray reports emphysematous changes with no acute process.  On examination she is afebrile, heart rate in the 70s in sinus rhythm, blood pressure 112/77. Still has some mild residual chest discomfort. Examination of the neck reveals no bruits, lungs are clear with diminished breath sounds, cardiac exam with regular rate and rhythm, no gallop, no peripheral edema. She does have some mild left-sided weakness versus right which is baseline per patient. No reported visual deficits, speech problems, memory problems. She does take she has been under a lot of stress recently.  Lab work reviewed the findings of potassium 4.3, BUN 12, creatinine 1.0, hemoglobin 13.4, platelets 273, INR 1.0.  Chest pain syndrome, concern for unstable angina with low-level increase in troponins, still with stuttering symptoms. She is otherwise hemodynamically stable at this time. ECG is nonspecific. Situation was reviewed with the patient including further diagnostic options from a cardiac perspective, both noninvasive and invasive techniques. After reviewing the matter, plan is to have her transferred to our cardiology service at [facility]. Would recommend formal consultation with neurosurgery to specifically address the question of whether the patient would be a candidate for long-term aspirin and/or dual antiplatelet therapy. This will be particularly important if she ultimately has an obstructive coronary lesion amenable to intervention. Cardiac catheterization can then be pursued if this is the case for diagnostic, and potentially therapeutic purposes. In the  short run, we will continue low-dose aspirin, however discontinue heparin until this question is further clarified. She is also on beta blocker and statin therapy. Case was discussed with our transfer coordinator, Wannetta Sender, at [facility] to assist with neurosurgical consultation. Accepting physician is Dr. Tonia Brooms.  Satira Sark, M.D., F.A.C.C.

## 2011-09-22 DIAGNOSIS — R7989 Other specified abnormal findings of blood chemistry: Secondary | ICD-10-CM

## 2011-09-22 DIAGNOSIS — I10 Essential (primary) hypertension: Secondary | ICD-10-CM

## 2011-09-22 NOTE — Consult Note (Signed)
Reason for Consult: Question of anti-platelets therapy in this patient with possible atherosclerotic cardiovascular disease and a history of a hemorrhagic stroke. Referring Physician: Dr. [name], [facility] cardiology  [name] is an 51 y.o. right-handed white female.  HPI: Patient has been admitted to the cardiology service in transfer from the hospitalist service at [facility] because of chest pain. The patient explains to me that earlier this week she was having headache, which she explains often is due to a elevated blood pressure. Her blood pressure had been treated up until about a year ago at which time her primary physician and she felt that her blood pressure was not active problem at that time and her blood pressure medication was stopped.  The patient explains to me that she initially tried to see her primary physician, then tried to be seen in urgent care and eventually was seen at the [facility] emergency room. She was found to be significantly hypertensive with a systolic blood pressure in the 190s, but she was also found to have elevated cardiac enzymes, and was admitted for further evaluation and care, subsequently been transferred to [facility]. She apparently was treated with heparin drip for about a day, however it has since been stopped.  The cardiology service is evaluating the patient, and is considering cardiac catheterization, but requested neurosurgical input as regards the question of antiplatelet therapy.  Symptomatically patient explains that she's had recurring episodes of burning chest pain, also although her blood pressure is better controlled (now in the 287O systolic) she is still having some difficulties with headache because of the nitroglycerin patch that she is being treated with at this time.  Neurologically she describes some residual mild loss of fine motor control in her left hand, but no new  neurologic difficulties such as diplopia, blurry vision, weakness, seizures, etc.  Her neurosurgical past history is notable for having suffered a spontaneous right frontal intracerebral hematoma in April of 1999. She was studied with cerebral arteriography which revealed no vascular lesion, and she was taken to surgery for a craniotomy and evacuation of hematoma which I performed. She did well following surgery, and underwent rehabilitation. She was restudied with MRI of the brain in 06/22/1998 which showed changes consistent with her hemorrhage and surgery, but no evidence of vascular lesion or tumor. She was further study with repeat cerebral arteriography in December of 1999 which again showed no evidence of vascular lesion.  I have also since performed a successful C5-6 anterior cervical decompression and arthrodesis for this patient in October 2004, and have most recently evaluated her cervical and lumbar spine in March of 2010.  Past Medical History:  Past Medical History  Diagnosis Date  . Hypertension   . Stroke     Past Surgical History:  Past Surgical History  Procedure Date  . Abdominal hysterectomy   . Cervical fusion   . Rectocele repair     Family History:  Family History  Problem Relation Age of Onset  . Coronary artery disease Paternal Grandfather   . Coronary artery disease Paternal Uncle     Social History:  reports that she has never smoked. She has never used smokeless tobacco. She reports that she does not drink alcohol or use illicit drugs.  Allergies:  Allergies  Allergen Reactions  . Morphine And Related Hives  . Phenergan     Causes patient to become Hyper    Medications: I have reviewed  the patient's current medications.  Results for orders placed during the hospital encounter of 09/20/11 (from the past 48 hour(s))  CARDIAC PANEL(CRET KIN+CKTOT+MB+TROPI)     Status: Abnormal   Collection Time   09/20/11  7:18 PM      Component Value Range  Comment   Total CK 40  7 - 177 (U/L)    CK, MB 1.2  0.3 - 4.0 (ng/mL)    Troponin I 0.50 (*) <0.30 (ng/mL)    Relative Index RELATIVE INDEX IS INVALID  0.0 - 2.5    TSH     Status: Normal   Collection Time   09/20/11  8:20 PM      Component Value Range Comment   TSH 1.438  0.350 - 4.500 (uIU/mL)   CARDIAC PANEL(CRET KIN+CKTOT+MB+TROPI)     Status: Abnormal   Collection Time   09/21/11  3:29 AM      Component Value Range Comment   Total CK 41  7 - 177 (U/L)    CK, MB 1.2  0.3 - 4.0 (ng/mL)    Troponin I 0.49 (*) <0.30 (ng/mL)    Relative Index RELATIVE INDEX IS INVALID  0.0 - 2.5    COMPREHENSIVE METABOLIC PANEL     Status: Abnormal   Collection Time   09/21/11  3:29 AM      Component Value Range Comment   Sodium 138  135 - 145 (mEq/L)    Potassium 4.3  3.5 - 5.1 (mEq/L)    Chloride 103  96 - 112 (mEq/L)    CO2 28  19 - 32 (mEq/L)    Glucose, Bld 105 (*) 70 - 99 (mg/dL)    BUN 12  6 - 23 (mg/dL)    Creatinine, Ser 1.00  0.50 - 1.10 (mg/dL)    Calcium 9.5  8.4 - 10.5 (mg/dL)    Total Protein 6.5  6.0 - 8.3 (g/dL)    Albumin 3.5  3.5 - 5.2 (g/dL)    AST 22  0 - 37 (U/L)    ALT 24  0 - 35 (U/L)    Alkaline Phosphatase 65  39 - 117 (U/L)    Total Bilirubin 0.4  0.3 - 1.2 (mg/dL)    GFR calc non Af Amer 66 (*) >90 (mL/min)    GFR calc Af Amer 76 (*) >90 (mL/min)   CBC     Status: Normal   Collection Time   09/21/11  3:29 AM      Component Value Range Comment   WBC 5.6  4.0 - 10.5 (K/uL)    RBC 4.23  3.87 - 5.11 (MIL/uL)    Hemoglobin 13.4  12.0 - 15.0 (g/dL)    HCT 38.6  36.0 - 46.0 (%)    MCV 91.3  78.0 - 100.0 (fL)    MCH 31.7  26.0 - 34.0 (pg)    MCHC 34.7  30.0 - 36.0 (g/dL)    RDW 12.0  11.5 - 15.5 (%)    Platelets 273  150 - 400 (K/uL)   HEPARIN LEVEL (UNFRACTIONATED)     Status: Abnormal   Collection Time   09/21/11  3:29 AM      Component Value Range Comment   Heparin Unfractionated 0.80 (*) 0.30 - 0.70 (IU/mL)   PROTIME-INR     Status: Normal   Collection Time     09/21/11  3:29 AM      Component Value Range Comment   Prothrombin Time 13.4  11.6 - 15.2 (seconds)  INR 1.00  0.00 - 1.49    CARDIAC PANEL(CRET KIN+CKTOT+MB+TROPI)     Status: Abnormal   Collection Time   09/21/11 10:42 AM      Component Value Range Comment   Total CK 39  7 - 177 (U/L)    CK, MB 1.2  0.3 - 4.0 (ng/mL)    Troponin I 0.49 (*) <0.30 (ng/mL)    Relative Index RELATIVE INDEX IS INVALID  0.0 - 2.5    MRSA PCR SCREENING     Status: Normal   Collection Time   09/21/11  7:39 PM      Component Value Range Comment   MRSA by PCR NEGATIVE  NEGATIVE      Review of systems is notable for those of those described in her history of present illness and her past medical history is otherwise unremarkable.  Blood pressure 126/74, pulse 66, temperature 97.5 F (36.4 C), temperature source Oral, resp. rate 17, height 5' 4"  (1.626 m), weight 78.2 kg (172 lb 6.4 oz), SpO2 97.00%. Gen. Examination: Patient is a well-developed well-nourished white female in no acute distress. Neurologic examination: Patient is awake and alert, fully oriented, following commands, with good speech and comprehension. Cranial nerves show pupils are equal round and reactive to light. Extraocular movements are intact. Facial movement is symmetrical. Palatal movement is symmetrical. Hearing is present bilaterally shoulder shrug is symmetrical and tongue is midline. Motor examination shows 5 or 5 strength to the upper and lower extremities, she has no drift of the upper extremities. Sensation is intact to pinprick throughout. Reflexes are symmetrical. Gait and stance are not tested due to the nature of her condition.  Assessment/Plan: 51 year old female with a history of a right frontal intracerebral hematoma 14 years ago that required surgical evacuation. Workup revealed no evidence of a vascular lesion nor tumor. Now having difficulties with chest pain (that seems to have been related to uncontrolled  hypertension).  I spoke with the patient at length at her bedside and also had the opportunity to speak by phone with Dr. [name], her cardiologist. Rene Paci explained to them that the question of the use of antiplatelet agents, and for that matter anticoagulants, is a matter of relative risk. That is balancing the risk of potential atherosclerotic cardiovascular disease versus the risk of recurrent intracranial hemorrhage.  In the end it does certainly seem reasonable to me that the patient's coronary vasculature be assessed with coronary angiography. And if there is significant atherosclerotic coronary vascular disease and certainly the use of aspirin, possibly with a time-limited concurrent treatment with Plavix (probably limited to the 3-6 months range) would probably not pose excessive risk of recurrent intracranial hemorrhage. On the other hand I feel that there would have to be a strongly compelling indication for the use of anticoagulant therapy, such as heparin, Coumadin, Pradexa, Effient, and so on, because there would be a significantly increased risk of recurrent intracranial hemorrhage.  I did discuss briefly with Dr. Harrington Challenger, that if the cardiology service is considering coronary stenting, that they were would need to consider the relative risks of bare metal stents versus drug-eluting stent, in the light of the relative risks of antiplatelet therapy and anticoagulation therapy.  Both the patient's and Dr. Alan Ripper questions were answered for them. I did explain to both the patient and Dr. Harrington Challenger that I will be out-of-town until April 1. If an emergent neurosurgical issue arises between now and then, my partner on call on that day would need to be  consulted.  Hosie Spangle, MD 09/22/2011, 6:54 PM

## 2011-09-22 NOTE — Plan of Care (Signed)
Problem: Food- and Nutrition-Related Knowledge Deficit (NB-1.1) Goal: Nutrition education Formal process to instruct or train a patient/client in a skill or to impart knowledge to help patients/clients voluntarily manage or modify food choices and eating behavior to maintain or improve health.  Outcome: Completed/Met Date Met:  09/22/11 At patient's request, provided information about heart healthy eating.  Discussed importance of decreasing saturated fat and sodium intake.  Patient reports good appetite and good intake of heart healthy diet; wants to lose weight when she goes home.  BMI=29.6 (overweight) Heart Healthy handout provided to patient; questions answered.  RD contact information provided for any future questions.  No further nutrition needs at this time.  Recommend diet education/weight loss counseling as an outpatient.  Dalene Carrow [phone]

## 2011-09-22 NOTE — Progress Notes (Signed)
Subjective: No CP or SOB now.  Did have a little overnigh  Some are sharp  Some are a burning. Objective: Filed Vitals:   09/21/11 1940 09/21/11 2158 09/22/11 0000 09/22/11 0425  BP: 129/74  108/59 123/72  Pulse: 80 75 66 73  Temp: 97.9 F (36.6 C)  97.7 F (36.5 C) 97.6 F (36.4 C)  TempSrc: Oral  Oral Oral  Resp: 13  13 14   Height: 5' 4"  (1.626 m)     Weight: 172 lb 6.4 oz (78.2 kg)     SpO2: 100%  97% 96%   Weight change: 7 lb 6.4 oz (3.357 kg)  Intake/Output Summary (Last 24 hours) at 09/22/11 1010 Last data filed at 09/22/11 0400  Gross per 24 hour  Intake    720 ml  Output      2 ml  Net    718 ml    General: Alert, awake, oriented x3, in no acute distress Neck:  JVP is normal Heart: Regular rate and rhythm, without murmurs, rubs, gallops.  Lungs: Clear to auscultation.  No rales or wheezes. Exemities:  No edema.   Neuro: Grossly intact, nonfocal.  Tele:  SR.   Lab Results: Results for orders placed during the hospital encounter of 09/20/11 (from the past 24 hour(s))  CARDIAC PANEL(CRET KIN+CKTOT+MB+TROPI)     Status: Abnormal   Collection Time   09/21/11 10:42 AM      Component Value Range   Total CK 39  7 - 177 (U/L)   CK, MB 1.2  0.3 - 4.0 (ng/mL)   Troponin I 0.49 (*) <0.30 (ng/mL)   Relative Index RELATIVE INDEX IS INVALID  0.0 - 2.5   MRSA PCR SCREENING     Status: Normal   Collection Time   09/21/11  7:39 PM      Component Value Range   MRSA by PCR NEGATIVE  NEGATIVE     Studies/Results: Dg Chest 2 View  09/20/2011  *RADIOLOGY REPORT*  Clinical Data: Chest pain, elevated blood pressure  CHEST - 2 VIEW  Comparison: 07/14/2011  Findings: Normal heart size, mediastinal contours, and pulmonary vascularity. Minimal chronic bibasilar atelectasis versus scarring. Underlying emphysematous changes. No acute infiltrate, pleural effusion, or pneumothorax. Bones appear demineralized. Atherosclerotic calcification aortic arch. Prior cervical spine fusion.   IMPRESSION: Emphysematous changes with minimal chronic bibasilar atelectasis versus scarring. No acute abnormalities.  Original Report Authenticated By: Burnetta Sabin, M.D.    Medications: I have reviewed the patient's current medications.   Patient Active Hospital Problem List: Chest pain at rest (09/20/2011)/Unstable angina    Assessment: Some atypical and typical features.  What is abnormal is sl troponin bump.  I agree with S [facility] evaluation yesterday.  I would recomm L heart cath to define anatomy BUT need input from neurosurgery.  Would do if she would be a candidate for antiplatelet agents.  WIll not schedule anything until assessment made.    Hyperlipidemia (09/20/2011)   Assessment: Continue simvistatin.    Cerebrovascular disease (3/19/201  Essential hypertension, benign (09/21/2011)   Assessment: BP is adequate.     LOS: 2 days   [name] 09/22/2011, 10:10 AM

## 2011-09-23 ENCOUNTER — Encounter (HOSPITAL_COMMUNITY)
Admission: EM | Disposition: A | Discharge status: Home / Self Care | Payer: Self-pay | Source: Home / Self Care | Attending: Cardiology

## 2011-09-23 DIAGNOSIS — R079 Chest pain, unspecified: Secondary | ICD-10-CM

## 2011-09-23 HISTORY — PX: LEFT HEART CATHETERIZATION WITH CORONARY ANGIOGRAM: SHX5451

## 2011-09-23 LAB — CBC
HCT: 40.1 % (ref 36.0–46.0)
Hemoglobin: 13.7 g/dL (ref 12.0–15.0)
MCH: 31.5 pg (ref 26.0–34.0)
MCHC: 34.2 g/dL (ref 30.0–36.0)
MCV: 92.2 fL (ref 78.0–100.0)
Platelets: 268 10*3/uL (ref 150–400)
RBC: 4.35 MIL/uL (ref 3.87–5.11)
RDW: 11.9 % (ref 11.5–15.5)
WBC: 5.3 10*3/uL (ref 4.0–10.5)

## 2011-09-23 SURGERY — LEFT HEART CATHETERIZATION WITH CORONARY ANGIOGRAM
Anesthesia: LOCAL

## 2011-09-23 MED ORDER — SODIUM CHLORIDE 0.9 % IV SOLN: 250.0000 mL | INTRAVENOUS | Status: DC | PRN

## 2011-09-23 MED ORDER — ACETAMINOPHEN 325 MG PO TABS
650.0000 mg | ORAL_TABLET | Freq: Once | ORAL | Status: AC
  Administered 2011-09-23: 650 mg via ORAL

## 2011-09-23 MED ORDER — LIDOCAINE HCL (PF) 1 % IJ SOLN
INTRAMUSCULAR | Status: AC
  Filled 2011-09-23: qty 30

## 2011-09-23 MED ORDER — HEPARIN SODIUM (PORCINE) 1000 UNIT/ML IJ SOLN
INTRAMUSCULAR | Status: AC
  Filled 2011-09-23: qty 1

## 2011-09-23 MED ORDER — SODIUM CHLORIDE 0.9 % IJ SOLN: 3.0000 mL | INTRAMUSCULAR | Status: DC | PRN

## 2011-09-23 MED ORDER — MIDAZOLAM HCL 2 MG/2ML IJ SOLN
INTRAMUSCULAR | Status: AC
  Filled 2011-09-23: qty 2

## 2011-09-23 MED ORDER — ACETAMINOPHEN 325 MG PO TABS: 650.0000 mg | ORAL_TABLET | ORAL | Status: DC | PRN

## 2011-09-23 MED ORDER — ONDANSETRON HCL 4 MG/2ML IJ SOLN: 4.0000 mg | Freq: Four times a day (QID) | INTRAMUSCULAR | Status: DC | PRN

## 2011-09-23 MED ORDER — SODIUM CHLORIDE 0.9 % IV SOLN: 1.0000 mL/kg/h | INTRAVENOUS | Status: AC

## 2011-09-23 MED ORDER — SODIUM CHLORIDE 0.9 % IJ SOLN: 3.0000 mL | Freq: Two times a day (BID) | INTRAMUSCULAR | Status: DC

## 2011-09-23 MED ORDER — FENTANYL CITRATE 0.05 MG/ML IJ SOLN
INTRAMUSCULAR | Status: AC
  Filled 2011-09-23: qty 2

## 2011-09-23 MED ORDER — HEPARIN (PORCINE) IN NACL 2-0.9 UNIT/ML-% IJ SOLN
INTRAMUSCULAR | Status: AC
  Filled 2011-09-23: qty 2000

## 2011-09-23 MED ORDER — ASPIRIN 81 MG PO CHEW
324.0000 mg | CHEWABLE_TABLET | ORAL | Status: AC
  Administered 2011-09-23: 324 mg via ORAL
  Filled 2011-09-23: qty 4

## 2011-09-23 MED ORDER — SODIUM CHLORIDE 0.9 % IV SOLN: 1.0000 mL/kg/h | INTRAVENOUS | Status: DC

## 2011-09-23 MED ORDER — SODIUM CHLORIDE 0.9 % IV SOLN: 250.0000 mL | INTRAVENOUS | Status: DC

## 2011-09-23 MED ORDER — NITROGLYCERIN 0.2 MG/ML ON CALL CATH LAB
INTRAVENOUS | Status: AC
  Filled 2011-09-23: qty 1

## 2011-09-23 MED ORDER — SODIUM CHLORIDE 0.9 % IV SOLN
1.0000 mL/kg/h | INTRAVENOUS | Status: DC
  Administered 2011-09-23: 1 mL/kg/h via INTRAVENOUS

## 2011-09-23 NOTE — CV Procedure (Signed)
   Cardiac Catheterization Procedure Note  Name: [name] MRN: [id number] DOB: 09-11-1960  Procedure: Left Heart Cath, Selective Coronary Angiography, LV angiography  Indication: Chest pain with positive troponin   Procedural Details: The right wrist was prepped, draped, and anesthetized with 1% lidocaine. Using the modified Seldinger technique, a 5 French sheath was introduced into the right radial artery. A 2.5 mg of nicardipine was administered through the sheath, weight-based unfractionated heparin was administered intravenously. Standard Judkins catheters were used for selective coronary angiography and left ventriculography. Catheter exchanges were performed over an exchange length guidewire. There were no immediate procedural complications. A TR band was used for radial hemostasis at the completion of the procedure.  The patient was transferred to the post catheterization recovery area for further monitoring.  Procedural Findings: Hemodynamics: AO 110/47 LV 110/13  Coronary angiography: Coronary dominance: right  Left mainstem: Widely patent no obstructive disease  Left anterior descending (LAD): Courses to the left ventricular apex. No obstructive disease is present.  Left circumflex (LCx): No obstructive disease is present.  Right coronary artery (RCA): The RCA is dominant. There is no significant stenosis present.  Left ventriculography: Left ventricular systolic function is normal, LVEF is estimated at 55-65%, there is no significant mitral regurgitation   Final Conclusions:   1. Widely patent coronary arteries 2. Normal left ventricular systolic function  Recommendations: Suspect noncardiac chest pain. I don't explain the patient's elevated troponin. She did have markedly elevated blood pressure on admission. She is symptom-free now. I would recommend overnight observation and she should be eligible for discharge tomorrow morning if she is having no symptomatic  complaints at that time.  I would not recommend putting the patient on any antiplatelet therapy with her history of intracranial hemorrhage. She does not have significant CAD and really shouldn't get much benefit from an antiplatelet drug. She needs close followup of her hypertension as I suspect this was related to hypertensive crisis. The patient can be followed long-term by her primary care physician.  [name] 09/23/2011, 4:02 PM

## 2011-09-23 NOTE — H&P (View-Only) (Signed)
Subjective: Patinet notes intermittent chest pressure.  No burning.  No SOB. Objective: Filed Vitals:   09/22/11 2103 09/23/11 0001 09/23/11 0335 09/23/11 0815  BP: 132/74 103/56 98/63 117/73  Pulse: 68 70 63 64  Temp:  97.5 F (36.4 C) 97.9 F (36.6 C) 97.9 F (36.6 C)  TempSrc:  Oral Oral Oral  Resp:  15 19 13   Height:      Weight:      SpO2:  100% 98% 97%   Weight change:   Intake/Output Summary (Last 24 hours) at 09/23/11 1104 Last data filed at 09/23/11 0800  Gross per 24 hour  Intake      0 ml  Output   2325 ml  Net  -2325 ml    General: Alert, awake, oriented x3, in no acute distress Neck:  JVP is normal Heart: Regular rate and rhythm, without murmurs, rubs, gallops.  Lungs: Clear to auscultation.  No rales or wheezes. Exemities:  No edema.   Neuro: Grossly intact, nonfocal.   Lab Results: Results for orders placed during the hospital encounter of 09/20/11 (from the past 24 hour(s))  CBC     Status: Normal   Collection Time   09/23/11  4:20 AM      Component Value Range   WBC 5.3  4.0 - 10.5 (K/uL)   RBC 4.35  3.87 - 5.11 (MIL/uL)   Hemoglobin 13.7  12.0 - 15.0 (g/dL)   HCT 40.1  36.0 - 46.0 (%)   MCV 92.2  78.0 - 100.0 (fL)   MCH 31.5  26.0 - 34.0 (pg)   MCHC 34.2  30.0 - 36.0 (g/dL)   RDW 11.9  11.5 - 15.5 (%)   Platelets 268  150 - 400 (K/uL)    Studies/Results: No results found.  Medications: I have reviewed the patient's current medications.   Patient Active Hospital Problem List: Chest pain at rest (09/20/2011)   Assessment: patient with intermitt pains. Appreciate input for Dr. Sherwood Gambler re previous neuro history.  Will review with our interventialists.    Hyperlipidemia (09/20/2011)   Assessment: Continue simvistatin.  Cerebrovascular disease (09/20/2011)   Assessment: See note by Dr. Sherwood Gambler   Unstable angina (09/21/2011)   Assessment: As above.  Has had sl bump in troponin this admit.  Essential hypertension, benign (09/21/2011)  Assessment: Adequate control      LOS: 3 days   [name] 09/23/2011, 11:04 AM

## 2011-09-23 NOTE — Progress Notes (Signed)
Subjective: Patinet notes intermittent chest pressure.  No burning.  No SOB. Objective: Filed Vitals:   09/22/11 2103 09/23/11 0001 09/23/11 0335 09/23/11 0815  BP: 132/74 103/56 98/63 117/73  Pulse: 68 70 63 64  Temp:  97.5 F (36.4 C) 97.9 F (36.6 C) 97.9 F (36.6 C)  TempSrc:  Oral Oral Oral  Resp:  15 19 13   Height:      Weight:      SpO2:  100% 98% 97%   Weight change:   Intake/Output Summary (Last 24 hours) at 09/23/11 1104 Last data filed at 09/23/11 0800  Gross per 24 hour  Intake      0 ml  Output   2325 ml  Net  -2325 ml    General: Alert, awake, oriented x3, in no acute distress Neck:  JVP is normal Heart: Regular rate and rhythm, without murmurs, rubs, gallops.  Lungs: Clear to auscultation.  No rales or wheezes. Exemities:  No edema.   Neuro: Grossly intact, nonfocal.   Lab Results: Results for orders placed during the hospital encounter of 09/20/11 (from the past 24 hour(s))  CBC     Status: Normal   Collection Time   09/23/11  4:20 AM      Component Value Range   WBC 5.3  4.0 - 10.5 (K/uL)   RBC 4.35  3.87 - 5.11 (MIL/uL)   Hemoglobin 13.7  12.0 - 15.0 (g/dL)   HCT 40.1  36.0 - 46.0 (%)   MCV 92.2  78.0 - 100.0 (fL)   MCH 31.5  26.0 - 34.0 (pg)   MCHC 34.2  30.0 - 36.0 (g/dL)   RDW 11.9  11.5 - 15.5 (%)   Platelets 268  150 - 400 (K/uL)    Studies/Results: No results found.  Medications: I have reviewed the patient's current medications.   Patient Active Hospital Problem List: Chest pain at rest (09/20/2011)   Assessment: patient with intermitt pains. Appreciate input for Dr. Sherwood Gambler re previous neuro history.  Will review with our interventialists.    Hyperlipidemia (09/20/2011)   Assessment: Continue simvistatin.  Cerebrovascular disease (09/20/2011)   Assessment: See note by Dr. Sherwood Gambler   Unstable angina (09/21/2011)   Assessment: As above.  Has had sl bump in troponin this admit.  Essential hypertension, benign (09/21/2011)  Assessment: Adequate control      LOS: 3 days   [name] 09/23/2011, 11:04 AM

## 2011-09-23 NOTE — Progress Notes (Signed)
UR Completed. Simmons, [name] F [phone]  

## 2011-09-23 NOTE — Interval H&P Note (Signed)
History and Physical Interval Note:  09/23/2011 3:21 PM  [name]  has presented today for surgery, with the diagnosis of chest pain  The various methods of treatment have been discussed with the patient and family. After consideration of risks, benefits and other options for treatment, the patient has consented to  Procedure(s) (LRB): LEFT HEART CATHETERIZATION WITH CORONARY ANGIOGRAM (N/A) as a surgical intervention .  The patients' history has been reviewed, patient examined, no change in status, stable for surgery.  I have reviewed the patients' chart and labs.  Questions were answered to the patient's satisfaction.  Dr Donnella Bi thoughtful note reviewed and his recommendations are appreciated.   [name]  09/23/2011 3:22 PM

## 2011-09-24 DIAGNOSIS — R079 Chest pain, unspecified: Secondary | ICD-10-CM

## 2011-09-24 LAB — CBC
HCT: 40.7 % (ref 36.0–46.0)
Hemoglobin: 14 g/dL (ref 12.0–15.0)
MCH: 32 pg (ref 26.0–34.0)
MCHC: 34.4 g/dL (ref 30.0–36.0)
MCV: 92.9 fL (ref 78.0–100.0)
Platelets: 270 10*3/uL (ref 150–400)
RBC: 4.38 MIL/uL (ref 3.87–5.11)
RDW: 12 % (ref 11.5–15.5)
WBC: 6.4 10*3/uL (ref 4.0–10.5)

## 2011-09-24 MED ORDER — METOPROLOL TARTRATE 12.5 MG HALF TABLET: 12.5000 mg | ORAL_TABLET | Freq: Two times a day (BID) | ORAL | Status: DC

## 2011-09-24 MED ORDER — PANTOPRAZOLE SODIUM 40 MG PO TBEC: 40.0000 mg | DELAYED_RELEASE_TABLET | Freq: Every day | ORAL | Status: DC

## 2011-09-24 NOTE — Plan of Care (Signed)
Problem: Consults Goal: Chest Pain Patient Education (See Patient Education module for education specifics.)  Outcome: Completed/Met Date Met:  09/24/11 Reviewed chest pain cardiac vs GI and need to take protonix and metoprolol as directed, verbalized understanding  Problem: Phase III Progression Outcomes Goal: Vascular site scale level 0 - I Vascular Site Scale Level 0: No bruising/bleeding/hematoma Level I (Mild): Bruising/Ecchymosis, minimal bleeding/ooozing, palpable hematoma < 3 cm Level II (Moderate): Bleeding not affecting hemodynamic parameters, pseudoaneurysm, palpable hematoma > 3 cm  Outcome: Completed/Met Date Met:  09/24/11 Level 0 right radial site Goal: Discharge plan remains appropriate-arrangements made Outcome: Completed/Met Date Met:  09/24/11 Discharged today

## 2011-09-24 NOTE — Progress Notes (Signed)
CARDIOLOGY CONSULT NOTE

## 2011-09-24 NOTE — Discharge Instructions (Signed)
Arterial Hypertension Arterial hypertension (high blood pressure) is a condition of elevated pressure in your blood vessels. Hypertension over a long period of time is a risk factor for strokes, heart attacks, and heart failure. It is also the leading cause of kidney (renal) failure.  CAUSES   In Adults -- Over 90% of all hypertension has no known cause. This is called essential or primary hypertension. In the other 10% of people with hypertension, the increase in blood pressure is caused by another disorder. This is called secondary hypertension. Important causes of secondary hypertension are:   Heavy alcohol use.   Obstructive sleep apnea.   Hyperaldosterosim (Conn's syndrome).   Steroid use.   Chronic kidney failure.   Hyperparathyroidism.   Medications.   Renal artery stenosis.   Pheochromocytoma.   Cushing's disease.   Coarctation of the aorta.   Scleroderma renal crisis.   Licorice (in excessive amounts).   Drugs (cocaine, methamphetamine).  Your caregiver can explain any items above that apply to you.  In Children -- Secondary hypertension is more common and should always be considered.   Pregnancy -- Few women of childbearing age have high blood pressure. However, up to 10% of them develop hypertension of pregnancy. Generally, this will not harm the woman. It may be a sign of 3 complications of pregnancy: preeclampsia, HELLP syndrome, and eclampsia. Follow up and control with medication is necessary.  SYMPTOMS   This condition normally does not produce any noticeable symptoms. It is usually found during a routine exam.   Malignant hypertension is a late problem of high blood pressure. It may have the following symptoms:   Headaches.   Blurred vision.   End-organ damage (this means your kidneys, heart, lungs, and other organs are being damaged).   Stressful situations can increase the blood pressure. If a person with normal blood pressure has their blood  pressure go up while being seen by their caregiver, this is often termed "white coat hypertension." Its importance is not known. It may be related with eventually developing hypertension or complications of hypertension.   Hypertension is often confused with mental tension, stress, and anxiety.  DIAGNOSIS  The diagnosis is made by 3 separate blood pressure measurements. They are taken at least 1 week apart from each other. If there is organ damage from hypertension, the diagnosis may be made without repeat measurements. Hypertension is usually identified by having blood pressure readings:  Above 140/90 mmHg measured in both arms, at 3 separate times, over a couple weeks.   Over 130/80 mmHg should be considered a risk factor and may require treatment in patients with diabetes.  Blood pressure readings over 120/80 mmHg are called "pre-hypertension" even in non-diabetic patients. To get a true blood pressure measurement, use the following guidelines. Be aware of the factors that can alter blood pressure readings.  Take measurements at least 1 hour after caffeine.   Take measurements 30 minutes after smoking and without any stress. This is another reason to quit smoking - it raises your blood pressure.   Use a proper cuff size. Ask your caregiver if you are not sure about your cuff size.   Most home blood pressure cuffs are automatic. They will measure systolic and diastolic pressures. The systolic pressure is the pressure reading at the start of sounds. Diastolic pressure is the pressure at which the sounds disappear. If you are elderly, measure pressures in multiple postures. Try sitting, lying or standing.   Sit at rest for a minimum of  5 minutes before taking measurements.   You should not be on any medications like decongestants. These are found in many cold medications.   Record your blood pressure readings and review them with your caregiver.  If you have hypertension:  Your caregiver  may do tests to be sure you do not have secondary hypertension (see "causes" above).   Your caregiver may also look for signs of metabolic syndrome. This is also called Syndrome X or Insulin Resistance Syndrome. You may have this syndrome if you have type 2 diabetes, abdominal obesity, and abnormal blood lipids in addition to hypertension.   Your caregiver will take your medical and family history and perform a physical exam.   Diagnostic tests may include blood tests (for glucose, cholesterol, potassium, and kidney function), a urinalysis, or an EKG. Other tests may also be necessary depending on your condition.  PREVENTION  There are important lifestyle issues that you can adopt to reduce your chance of developing hypertension:  Maintain a normal weight.   Limit the amount of salt (sodium) in your diet.   Exercise often.   Limit alcohol intake.   Get enough potassium in your diet. Discuss specific advice with your caregiver.   Follow a DASH diet (dietary approaches to stop hypertension). This diet is rich in fruits, vegetables, and low-fat dairy products, and avoids certain fats.  PROGNOSIS  Essential hypertension cannot be cured. Lifestyle changes and medical treatment can lower blood pressure and reduce complications. The prognosis of secondary hypertension depends on the underlying cause. Many people whose hypertension is controlled with medicine or lifestyle changes can live a normal, healthy life.  RISKS AND COMPLICATIONS  While high blood pressure alone is not an illness, it often requires treatment due to its short- and long-term effects on many organs. Hypertension increases your risk for:  CVAs or strokes (cerebrovascular accident).   Heart failure due to chronically high blood pressure (hypertensive cardiomyopathy).   Heart attack (myocardial infarction).   Damage to the retina (hypertensive retinopathy).   Kidney failure (hypertensive nephropathy).  Your caregiver can  explain list items above that apply to you. Treatment of hypertension can significantly reduce the risk of complications. TREATMENT   For overweight patients, weight loss and regular exercise are recommended. Physical fitness lowers blood pressure.   Mild hypertension is usually treated with diet and exercise. A diet rich in fruits and vegetables, fat-free dairy products, and foods low in fat and salt (sodium) can help lower blood pressure. Decreasing salt intake decreases blood pressure in a 1/3 of people.   Stop smoking if you are a smoker.  The steps above are highly effective in reducing blood pressure. While these actions are easy to suggest, they are difficult to achieve. Most patients with moderate or severe hypertension end up requiring medications to bring their blood pressure down to a normal level. There are several classes of medications for treatment. Blood pressure pills (antihypertensives) will lower blood pressure by their different actions. Lowering the blood pressure by 10 mmHg may decrease the risk of complications by as much as 25%. The goal of treatment is effective blood pressure control. This will reduce your risk for complications. Your caregiver will help you determine the best treatment for you according to your lifestyle. What is excellent treatment for one person, may not be for you. HOME CARE INSTRUCTIONS   Do not smoke.   Follow the lifestyle changes outlined in the "Prevention" section.   If you are on medications, follow the directions  carefully. Blood pressure medications must be taken as prescribed. Skipping doses reduces their benefit. It also puts you at risk for problems.   Follow up with your caregiver, as directed.   If you are asked to monitor your blood pressure at home, follow the guidelines in the "Diagnosis" section above.  SEEK MEDICAL CARE IF:   You think you are having medication side effects.   You have recurrent headaches or lightheadedness.     You have swelling in your ankles.   You have trouble with your vision.  SEEK IMMEDIATE MEDICAL CARE IF:   You have sudden onset of chest pain or pressure, difficulty breathing, or other symptoms of a heart attack.   You have a severe headache.   You have symptoms of a stroke (such as sudden weakness, difficulty speaking, difficulty walking).  MAKE SURE YOU:   Understand these instructions.   Will watch your condition.   Will get help right away if you are not doing well or get worse.  Document Released: 06/20/2005 Document Revised: 06/09/2011 Document Reviewed: 01/18/2007 [facility] Patient Information 2012 [place].

## 2011-09-24 NOTE — Progress Notes (Signed)
SUBJECTIVE: No complaints of recurrent chest pain.Ready to go home. Principal Problem:  *Chest pain at rest Active Problems:  Obesity  Hyperlipidemia  Cerebrovascular disease  Anxiety  Unstable angina  Essential hypertension, benign   LABS: Basic Metabolic Panel: No results found for this basename: NA:2,K:2,CL:2,CO2:2,GLUCOSE:2,BUN:2,CREATININE:2,CALCIUM:2,MG:2,PHOS:2 in the last 72 hours Liver Function Tests: No results found for this basename: AST:2,ALT:2,ALKPHOS:2,BILITOT:2,PROT:2,ALBUMIN:2 in the last 72 hours No results found for this basename: LIPASE:2,AMYLASE:2 in the last 72 hours CBC:  Basename 09/24/11 0500 09/23/11 0420  WBC 6.4 5.3  NEUTROABS -- --  HGB 14.0 13.7  HCT 40.7 40.1  MCV 92.9 92.2  PLT 270 268   Cardiac Enzymes:  Basename 09/21/11 1042  CKTOTAL 39  CKMB 1.2  CKMBINDEX --  TROPONINI 0.49*   No results found.   PHYSICAL EXAM BP 115/75  Pulse 70  Temp(Src) 98.4 F (36.9 C) (Oral)  Resp 15  Ht 5' 4"  (1.626 m)  Wt 173 lb 1 oz (78.5 kg)  BMI 29.71 kg/m2  SpO2 99% General: Well developed, well nourished, in no acute distress Head: Eyes PERRLA, No xanthomas.   Normal cephalic and atramatic  Lungs: Clear bilaterally to auscultation and percussion. Heart: HRRR S1 S2, No MRG .  Pulses are 2+ & equal.            No carotid bruit. No JVD.  No abdominal bruits. No femoral bruits. Abdomen: Bowel sounds are positive, abdomen soft and non-tender without masses or                  Hernia's noted. Msk:  Back normal, normal gait. Normal strength and tone for age. Extremities: No clubbing, cyanosis or edema.  DP +1 Neuro: Alert and oriented X 3. Psych:  Good affect, responds appropriately  TELEMETRY: Reviewed telemetry pt in [facility]  ASSESSMENT AND PLAN: 1. Chest pain: Resolved with better blood pressure control.  Cardiac cath completed revealing normal coronaries.  2. Hypertension: Better controlled with addition of metoprolol. Will continue this as  OP 3. History of hemorrhagic CVA: Followed by Dr. Quintin Alto.  Plans to discharge today with follow-up in [place] office.   Phill Myron. Purcell Nails NP [facility] 09/24/2011, 8:37 AM

## 2011-09-24 NOTE — Discharge Summary (Signed)
Physician Discharge Summary  Patient ID: [name] MRN: [id number] DOB/AGE: 49-Feb-1962 49 y.o.  Admit date: 09/20/2011 Discharge date: 09/24/2011  Primary Discharge Diagnosis: Chest Pain Secondary Discharge Diagnosis 1. Hypertension 2. Hx of remote hemorrhagic CVA  Significant Diagnostic Studies:Cardiac Cath 09/23/2011 Final Conclusions:  1. Widely patent coronary arteries  2. Normal left ventricular systolic function   Consults:  1. [facility]-Neurosurgery  Hospital Course: [name] is a 51 year old Caucasian female that was transferred from [facility] in the setting of recurrent chest pain. Positive, cardiac enzymes, and hypertension. Please see initial H&P and consult note by Dr. Domenic Polite from [facility] for more details. The patient has a history of hemorrhagic stroke 13 years ago. That was related to a leaky blood vessel that required craniotomy and surgery by Dr. Sherwood Gambler to remove thrombus and have repair. Prior to having cardiac catheterization. Dr. Lucia Gaskins was consulted for advisement in the anticipation of need for dual antiplatelet platelets therapy. Should intervention be necessary post cardiac catheterization. Dr. Sherwood Gambler did see the patient and recommended that the patient could be placed on aspirin and possibly a time-limited concurrent treatment with Plavix, 3-6 months range only. He did not recommend, use of Coumadin Pradaxa or Effient,  because they would significantly increased risks of recurrent intracranial hemorrhage.  Fortunately cardiac catheterization did not reveal any coronary artery disease. It was felt that the patient's recurrent chest pain was related to uncontrolled hypertension. LVEF was estimated at 55-60% with no significant mitral regurgitation. This was discussed with the patient on discharge and she was reassured. The patient is been placed on metoprolol 25 mg daily in a divided dose of 12.5 mg twice a day. She has tolerated this medication  without evidence of hypotension or bradycardia. She will followup with [place] in our office in [place] for continued management of hypertension. She is in the process of changing primary care physicians. She will need close followup post hospitalization for continued management of hypertension. She will, of course, be followed by Dr. Lucia Gaskins as an outpatient as well.   Discharge Exam: Blood pressure 115/75, pulse 70, temperature 98.4 F (36.9 C), temperature source Oral, resp. rate 15, height 5' 4"  (1.626 m), weight 173 lb 1 oz (78.5 kg), SpO2 99.00%.   General: Well developed, well nourished, in no acute distress Head: Eyes PERRLA, No xanthomas.   Normal cephalic and atramatic  Lungs: Clear bilaterally to auscultation and percussion. Heart: HRRR S1 S2, without MRG.  Pulses are 2+ & equal.            No carotid bruit. No JVD.  No abdominal bruits. No femoral bruits. Abdomen: Bowel sounds are positive, abdomen soft and non-tender without masses or                  Hernia's noted. Msk:  Back normal, normal gait. Normal strength and tone for age. Extremities: No clubbing, cyanosis or edema.  DP +1 No evidence of bleeding or hematoma from cath insertion site. Neuro: Alert and oriented X 3. Psych:  Good affect, responds appropriately Labs:   Lab Results  Component Value Date   WBC 6.4 09/24/2011   HGB 14.0 09/24/2011   HCT 40.7 09/24/2011   MCV 92.9 09/24/2011   PLT 270 09/24/2011    Lab 09/21/11 0329  NA 138  K 4.3  CL 103  CO2 28  BUN 12  CREATININE 1.00  CALCIUM 9.5  PROT 6.5  BILITOT 0.4  ALKPHOS 65  ALT 24  AST 22  GLUCOSE 105*   Lab Results  Component Value Date   CKTOTAL 39 09/21/2011   CKMB 1.2 09/21/2011   TROPONINI 0.49* 09/21/2011        Radiology: Dg Chest 2 View  09/20/2011  *RADIOLOGY REPORT*  Clinical Data: Chest pain, elevated blood pressure  CHEST - 2 VIEW  Comparison: 07/14/2011  Findings: Normal heart size, mediastinal contours, and pulmonary vascularity.  Minimal chronic bibasilar atelectasis versus scarring. Underlying emphysematous changes. No acute infiltrate, pleural effusion, or pneumothorax. Bones appear demineralized. Atherosclerotic calcification aortic arch. Prior cervical spine fusion.  IMPRESSION: Emphysematous changes with minimal chronic bibasilar atelectasis versus scarring. No acute abnormalities.  Original Report Authenticated By: Burnetta Sabin, M.D.   Mm Digital Screening  08/30/2011  DG SCREEN MAMMOGRAM BILATERAL Bilateral CC and MLO view(s) were taken.  DIGITAL SCREENING MAMMOGRAM WITH CAD: There are scattered fibroglandular densities.  No masses or malignant type calcifications are  identified.  Compared with prior studies.  Images were processed with CAD.  IMPRESSION: No specific mammographic evidence of malignancy.  Next screening mammogram is recommended in one  year.  A result letter of this screening mammogram will be mailed directly to the patient.  ASSESSMENT: Negative - BI-RADS 1  Screening mammogram in 1 year. ,    EKG:NSR [place] Discharge Orders    Future Orders Please Complete By Expires   Diet - low sodium heart healthy      Increase activity slowly        Medication List  As of 09/24/2011  8:54 AM   TAKE these medications         cyclobenzaprine 10 MG tablet   Commonly known as: FLEXERIL   Take 10 mg by mouth 3 (three) times daily as needed. For muscle spasms      LORazepam 0.5 MG tablet   Commonly known as: ATIVAN   Take 0.5 mg by mouth daily.      metoprolol tartrate 12.5 mg Tabs   Commonly known as: LOPRESSOR   Take 0.5 tablets (12.5 mg total) by mouth 2 (two) times daily.      mulitivitamin with minerals Tabs   Take 1 tablet by mouth daily.      pantoprazole 40 MG tablet   Commonly known as: PROTONIX   Take 1 tablet (40 mg total) by mouth daily at 12 noon.      polyethylene glycol packet   Commonly known as: MIRALAX / GLYCOLAX   Take 17 g by mouth daily.       simvastatin 20 MG tablet   Commonly known as: ZOCOR   Take 20 mg by mouth at bedtime. For cholesterol      traZODone 50 MG tablet   Commonly known as: DESYREL   Take 50 mg by mouth at bedtime.      venlafaxine 75 MG tablet   Commonly known as: EFFEXOR   Take 75 mg by mouth daily.           Follow-up Information    Follow up with Jory Sims, NP. ([place] office will call to make appointment)    Contact information:   [address] [place] [phone]          As above  Maybe woth considering labetolol or arvedilol for more effective bloop preesure relief for any dose of beta blocker  Will defer to primary care md/cardiology  Time spent with patient to include physician time:35  minutes Signed: Jory Sims 09/24/2011, 8:54 AM Co-Sign MD

## 2011-09-26 MED FILL — Nicardipine HCl IV Soln 2.5 MG/ML: INTRAVENOUS | Qty: 1 | Status: AC

## 2011-11-15 ENCOUNTER — Ambulatory Visit: Payer: No Typology Code available for payment source | Admitting: Family Medicine

## 2011-11-17 ENCOUNTER — Encounter: Payer: Self-pay | Admitting: Family Medicine

## 2011-11-17 ENCOUNTER — Ambulatory Visit (INDEPENDENT_AMBULATORY_CARE_PROVIDER_SITE_OTHER): Payer: No Typology Code available for payment source | Admitting: Family Medicine

## 2011-11-17 VITALS — BP 118/80 | HR 67 | Resp 16 | Ht 63.75 in | Wt 164.1 lb

## 2011-11-17 DIAGNOSIS — G4700 Insomnia, unspecified: Secondary | ICD-10-CM

## 2011-11-17 DIAGNOSIS — Z780 Asymptomatic menopausal state: Secondary | ICD-10-CM | POA: Insufficient documentation

## 2011-11-17 DIAGNOSIS — E669 Obesity, unspecified: Secondary | ICD-10-CM

## 2011-11-17 DIAGNOSIS — F411 Generalized anxiety disorder: Secondary | ICD-10-CM

## 2011-11-17 DIAGNOSIS — F3289 Other specified depressive episodes: Secondary | ICD-10-CM

## 2011-11-17 DIAGNOSIS — I10 Essential (primary) hypertension: Secondary | ICD-10-CM

## 2011-11-17 DIAGNOSIS — F32A Depression, unspecified: Secondary | ICD-10-CM | POA: Insufficient documentation

## 2011-11-17 DIAGNOSIS — F419 Anxiety disorder, unspecified: Secondary | ICD-10-CM

## 2011-11-17 DIAGNOSIS — F329 Major depressive disorder, single episode, unspecified: Secondary | ICD-10-CM

## 2011-11-17 DIAGNOSIS — N951 Menopausal and female climacteric states: Secondary | ICD-10-CM

## 2011-11-17 DIAGNOSIS — E785 Hyperlipidemia, unspecified: Secondary | ICD-10-CM

## 2011-11-17 DIAGNOSIS — I679 Cerebrovascular disease, unspecified: Secondary | ICD-10-CM

## 2011-11-17 DIAGNOSIS — R079 Chest pain, unspecified: Secondary | ICD-10-CM

## 2011-11-17 MED ORDER — CYCLOBENZAPRINE HCL 10 MG PO TABS: 10.0000 mg | ORAL_TABLET | Freq: Three times a day (TID) | ORAL | Status: DC | PRN

## 2011-11-17 MED ORDER — TRAZODONE HCL 50 MG PO TABS: 50.0000 mg | ORAL_TABLET | Freq: Every day | ORAL | Status: DC

## 2011-11-17 MED ORDER — VENLAFAXINE HCL ER 75 MG PO CP24: 150.0000 mg | ORAL_CAPSULE | Freq: Every day | ORAL | Status: DC

## 2011-11-17 MED ORDER — LORAZEPAM 0.5 MG PO TABS: 0.5000 mg | ORAL_TABLET | Freq: Every day | ORAL | Status: DC

## 2011-11-17 NOTE — Assessment & Plan Note (Signed)
Blood pressure is at goal with Lopressor. She was to follow with cardiology for hospital visit she was still like to see them. She is under the impression that her cardiac meds are supposed to be managed by them. [name] had normal catheterization should she may not need to be followed by cardiology

## 2011-11-17 NOTE — Assessment & Plan Note (Signed)
Currently on effexor see above

## 2011-11-17 NOTE — Assessment & Plan Note (Signed)
Followed by GYN, on black co-hosh, primrose oil

## 2011-11-17 NOTE — Assessment & Plan Note (Signed)
Patient has been working on her exercise routine with her husband

## 2011-11-17 NOTE — Assessment & Plan Note (Signed)
I do not see most recent lipid panel. She is on simvastatin. I will obtain her records.

## 2011-11-17 NOTE — Assessment & Plan Note (Signed)
Remote history of hemorrhagic stroke.

## 2011-11-17 NOTE — Assessment & Plan Note (Signed)
She does have intermittent episodes of chest pain. Per above normal coronaries

## 2011-11-17 NOTE — Progress Notes (Signed)
  Subjective:    Patient ID: [name], female    DOB: February 11, 1961, 51 y.o.   MRN: [id number]  HPI Pt here to establish care, previous PCP [name] GYN-Dr. Glo Herring GI- Dr. Pamalee Leyden Podiatrist  Medications and history reviewed. 51 y.o. Female with remote history of hemorrhagic stroke back in April of 1999. She has recovered significantly since then but still has some memory lapse and is slow to accomplish task. She was recently admitted secondary to chest pain. Cardiac catheterization in March 2013 was negative. Her chest pain elevated troponin was thought to be secondary to hypertension. [name] not had her followup with cardiology. Dr. Sherwood Gambler was her neurosurgeon.  Anxiety and depression-since her stroke she is suffer with anxiety and depression. [name] been on medications for the past 14 years. She was tried on BuSpar and well putrid in the past. She is currently on Effexor 75 mg daily as well as lorazepam daily. She takes trazodone for sleep. She is out of this medication need these refill. She has noticed over the past year her anxiety has worsened. She feels very stressed and when she gets anxious and she falls into a deep depression. When she is depressed she is unable to complete any of her tasks. She thinks her medications need to be titrated  Menopausal syndrome she is currently going through menopause and takes black cohosh as well as primrose oil daily. Her mammogram is up-to-date she is status post hysterectomy for dysfunctional uterine bleeding as well as cervical lesions and family history of uterine and ovarian cancer.  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   Review of Systems - per above   GEN- +fatigue, fever, weight loss,weakness, recent illness HEENT- denies eye drainage, change in vision, nasal discharge, CVS- denies chest pain, palpitations RESP- denies SOB, cough, wheeze ABD- denies N/V, change in stools, abd pain GU- denies dysuria, hematuria, dribbling,  incontinence MSK- + joint pain, muscle aches, injury Neuro- denies headache, dizziness, syncope, seizure activity       Objective:   Physical Exam GEN- NAD, alert and oriented x3, very pleasant  HEENT- PERRL, EOMI, non injected sclera, pink conjunctiva, MMM, oropharynx clear Neck- Supple, no thyromegaly CVS- RRR, no murmur RESP-CTAB ABD-NABS,soft, NT,ND  EXT- No edema Pulses- Radial, DP- 2+ Psych-normal affect and mood        Assessment & Plan:

## 2011-11-17 NOTE — Assessment & Plan Note (Signed)
trazadone refilled

## 2011-11-17 NOTE — Assessment & Plan Note (Signed)
I will increase her Effexor to the extended release. We confirmed the pharmacy that she is actually not taking the extended release medication this may just be wearing off early since she's been on it so long. It does not work for her or it is too expensive I would change her to twice daily Effexor

## 2011-11-17 NOTE — Patient Instructions (Addendum)
I will get the records form  Your previous physicians Effexor increased to 24hr medication Blood pressure looks good  I will refer you to cardiology for your follow-up  F/U in 6 weeks

## 2011-12-29 ENCOUNTER — Ambulatory Visit (INDEPENDENT_AMBULATORY_CARE_PROVIDER_SITE_OTHER): Payer: No Typology Code available for payment source | Admitting: Family Medicine

## 2011-12-29 ENCOUNTER — Encounter: Payer: Self-pay | Admitting: Family Medicine

## 2011-12-29 VITALS — BP 112/78 | HR 72 | Resp 18 | Ht 63.75 in | Wt 166.0 lb

## 2011-12-29 DIAGNOSIS — I209 Angina pectoris, unspecified: Secondary | ICD-10-CM

## 2011-12-29 DIAGNOSIS — F411 Generalized anxiety disorder: Secondary | ICD-10-CM

## 2011-12-29 DIAGNOSIS — E785 Hyperlipidemia, unspecified: Secondary | ICD-10-CM

## 2011-12-29 DIAGNOSIS — G4700 Insomnia, unspecified: Secondary | ICD-10-CM

## 2011-12-29 DIAGNOSIS — F419 Anxiety disorder, unspecified: Secondary | ICD-10-CM

## 2011-12-29 DIAGNOSIS — F3289 Other specified depressive episodes: Secondary | ICD-10-CM

## 2011-12-29 DIAGNOSIS — F32A Depression, unspecified: Secondary | ICD-10-CM

## 2011-12-29 DIAGNOSIS — R079 Chest pain, unspecified: Secondary | ICD-10-CM

## 2011-12-29 DIAGNOSIS — F329 Major depressive disorder, single episode, unspecified: Secondary | ICD-10-CM

## 2011-12-29 DIAGNOSIS — I10 Essential (primary) hypertension: Secondary | ICD-10-CM

## 2011-12-29 DIAGNOSIS — I208 Other forms of angina pectoris: Secondary | ICD-10-CM

## 2011-12-29 MED ORDER — ZOLPIDEM TARTRATE 10 MG PO TABS: 10.0000 mg | ORAL_TABLET | Freq: Every evening | ORAL | Status: DC | PRN

## 2011-12-29 NOTE — Patient Instructions (Addendum)
Try the ambien for your sleep Continue the effexor  We will call cardiology for your f/u visit  Get the lipids fasting  F/U 3 months

## 2011-12-29 NOTE — Progress Notes (Signed)
  Subjective:    Patient ID: [name], female    DOB: October 13, 1960, 51 y.o.   MRN: [id number]  HPI Pt presents to f/u medications and hypertension. She continues to have difficulty sleeping, the trazadone did not help. She is still anxious about returning to work, she is looking for a new job, she was not granted unemployment this year, though she has had it in the past. She declines speaking with a therapist, stating there is nothing they can do.   Review of Systems    GEN- +fatigue, fever, weight loss,weakness, recent illness HEENT- denies eye drainage, change in vision, nasal discharge, CVS- denies chest pain, palpitations RESP- denies SOB, cough, wheeze ABD- denies N/V, change in stools, abd pain GU- denies dysuria, hematuria, dribbling, incontinence MSK- + joint pain, muscle aches, injury Neuro- denies headache, dizziness, syncope, seizure activity     Objective:   Physical Exam  GEN- NAD, alert and oriented x3 CVS- RRR, no murmur RESP-CTAB EXT- No edema Pulses- Radial, DP- 2+ Psych- depressed affect, not anxious appearing,no apparent SI       Assessment & Plan:  P

## 2011-12-30 LAB — CBC
HCT: 41 % (ref 36.0–46.0)
Hemoglobin: 14.3 g/dL (ref 12.0–15.0)
MCH: 31.6 pg (ref 26.0–34.0)
MCHC: 34.9 g/dL (ref 30.0–36.0)
MCV: 90.7 fL (ref 78.0–100.0)
Platelets: 314 10*3/uL (ref 150–400)
RBC: 4.52 MIL/uL (ref 3.87–5.11)
RDW: 13.5 % (ref 11.5–15.5)
WBC: 4.9 10*3/uL (ref 4.0–10.5)

## 2011-12-30 LAB — COMPREHENSIVE METABOLIC PANEL
ALT: 30 U/L (ref 0–35)
AST: 25 U/L (ref 0–37)
Albumin: 4.8 g/dL (ref 3.5–5.2)
Alkaline Phosphatase: 81 U/L (ref 39–117)
BUN: 13 mg/dL (ref 6–23)
CO2: 27 mEq/L (ref 19–32)
Calcium: 9.8 mg/dL (ref 8.4–10.5)
Chloride: 103 mEq/L (ref 96–112)
Creat: 1.06 mg/dL (ref 0.50–1.10)
Glucose, Bld: 95 mg/dL (ref 70–99)
Potassium: 4.5 mEq/L (ref 3.5–5.3)
Sodium: 140 mEq/L (ref 135–145)
Total Bilirubin: 1 mg/dL (ref 0.3–1.2)
Total Protein: 7.2 g/dL (ref 6.0–8.3)

## 2011-12-30 LAB — LIPID PANEL
Cholesterol: 223 mg/dL — ABNORMAL HIGH (ref 0–200)
HDL: 56 mg/dL (ref >39)
LDL Cholesterol: 138 mg/dL — ABNORMAL HIGH (ref 0–99)
Total CHOL/HDL Ratio: 4 Ratio
Triglycerides: 147 mg/dL (ref <150)
VLDL: 29 mg/dL (ref 0–40)

## 2012-01-01 NOTE — Assessment & Plan Note (Signed)
unchanged

## 2012-01-01 NOTE — Assessment & Plan Note (Signed)
Still has not seen cardiology, blood pressure at goal

## 2012-01-01 NOTE — Assessment & Plan Note (Signed)
Doing well on long acting effexor

## 2012-01-01 NOTE — Assessment & Plan Note (Signed)
Continues to have intermittant CP, normal cath, I think there is probably a large component of anxiet and stress causing pain, nevertheless she did not have her f/u with cards, will send a new referral

## 2012-01-01 NOTE — Assessment & Plan Note (Signed)
Trial of [employer]

## 2012-01-01 NOTE — Assessment & Plan Note (Addendum)
Lipids to be done  Increase zocor to 108m

## 2012-01-02 MED ORDER — SIMVASTATIN 40 MG PO TABS: 40.0000 mg | ORAL_TABLET | Freq: Every day | ORAL | Status: DC

## 2012-01-02 NOTE — Addendum Note (Signed)
Addended by: Vic Blackbird F on: 01/02/2012 12:04 PM   Modules accepted: Orders

## 2012-01-17 ENCOUNTER — Ambulatory Visit: Payer: No Typology Code available for payment source | Admitting: Cardiology

## 2012-01-24 ENCOUNTER — Other Ambulatory Visit: Payer: Self-pay | Admitting: *Deleted

## 2012-01-24 MED ORDER — LORAZEPAM 0.5 MG PO TABS: 0.5000 mg | ORAL_TABLET | Freq: Every day | ORAL | Status: DC

## 2012-01-31 ENCOUNTER — Ambulatory Visit (INDEPENDENT_AMBULATORY_CARE_PROVIDER_SITE_OTHER): Payer: No Typology Code available for payment source | Admitting: Cardiology

## 2012-01-31 ENCOUNTER — Encounter: Payer: Self-pay | Admitting: Cardiology

## 2012-01-31 VITALS — BP 130/90 | HR 68 | Ht 63.0 in | Wt 163.0 lb

## 2012-01-31 DIAGNOSIS — I10 Essential (primary) hypertension: Secondary | ICD-10-CM | POA: Insufficient documentation

## 2012-01-31 DIAGNOSIS — K219 Gastro-esophageal reflux disease without esophagitis: Secondary | ICD-10-CM | POA: Insufficient documentation

## 2012-01-31 DIAGNOSIS — R079 Chest pain, unspecified: Secondary | ICD-10-CM

## 2012-01-31 DIAGNOSIS — E785 Hyperlipidemia, unspecified: Secondary | ICD-10-CM

## 2012-01-31 LAB — LIPID PANEL
Cholesterol: 181 mg/dL (ref 0–200)
HDL: 49 mg/dL (ref >39)
LDL Cholesterol: 89 mg/dL (ref 0–99)
Total CHOL/HDL Ratio: 3.7 Ratio
Triglycerides: 213 mg/dL — ABNORMAL HIGH (ref <150)
VLDL: 43 mg/dL — ABNORMAL HIGH (ref 0–40)

## 2012-01-31 NOTE — Assessment & Plan Note (Signed)
Absence of significant coronary disease is reassuring.  Patient should do well from a vascular standpoint with adequate control of hypertension and hyperlipidemia.

## 2012-01-31 NOTE — Progress Notes (Deleted)
Name: [name]    DOB: 05-Feb-1961  Age: 51 y.o.  MR#: [id number]       PCP:  Vic Blackbird, MD      Insurance: @PAYORNAME @   CC:    Chief Complaint  Patient presents with  . Hypertension    Med list reviewed over phone/TC  . Moderate Anxiety    VS BP 130/90  Pulse 68  Ht 5' 3"  (1.6 m)  Wt 163 lb (73.936 kg)  BMI 28.87 kg/m2  SpO2 98%  Weights Current Weight  01/31/12 163 lb (73.936 kg)  12/29/11 166 lb (75.297 kg)  11/17/11 164 lb 1.9 oz (74.444 kg)    Blood Pressure  BP Readings from Last 3 Encounters:  01/31/12 130/90  12/29/11 112/78  11/17/11 118/80     Admit date:  (Not on file) Last encounter with RMR:  Visit date not found   Allergy Allergies  Allergen Reactions  . Morphine And Related Hives  . Promethazine Hcl     Causes patient to become Hyper    Current Outpatient Prescriptions  Medication Sig Dispense Refill  . BLACK COHOSH PO Take by mouth.      . cyclobenzaprine (FLEXERIL) 10 MG tablet Take 1 tablet (10 mg total) by mouth 3 (three) times daily as needed. For muscle spasms  45 tablet  2  . Evening Primrose topical oil Apply topically as needed.      Marland Kitchen LORazepam (ATIVAN) 0.5 MG tablet Take 1 tablet (0.5 mg total) by mouth daily.  30 tablet  1  . metoprolol tartrate (LOPRESSOR) 12.5 mg TABS Take 0.5 tablets (12.5 mg total) by mouth 2 (two) times daily.  30 tablet  10  . Multiple Vitamin (MULITIVITAMIN WITH MINERALS) TABS Take 1 tablet by mouth daily.      . pantoprazole (PROTONIX) 40 MG tablet Take 1 tablet (40 mg total) by mouth daily at 12 noon.  30 tablet  10  . polyethylene glycol (MIRALAX / GLYCOLAX) packet Take 17 g by mouth daily.      . simvastatin (ZOCOR) 40 MG tablet Take 1 tablet (40 mg total) by mouth at bedtime. For cholesterol  30 tablet  3  . traZODone (DESYREL) 50 MG tablet Take 1 tablet (50 mg total) by mouth at bedtime.  30 tablet  3  . venlafaxine XR (EFFEXOR XR) 75 MG 24 hr capsule Take 2 capsules (150 mg total) by mouth  daily.  30 capsule  2  . zolpidem (AMBIEN) 10 MG tablet Take 1 tablet (10 mg total) by mouth at bedtime as needed for sleep.  30 tablet  2    Discontinued Meds:   There are no discontinued medications.  Patient Active Problem List  Diagnosis  . Chest pain at rest  . Obesity  . Hyperlipidemia  . Cerebrovascular disease  . Anxiety  . Unstable angina  . Essential hypertension, benign  . Depression  . Menopause  . Insomnia    LABS Office Visit on 12/29/2011  Component Date Value  . Sodium 12/29/2011 140   . Potassium 12/29/2011 4.5   . Chloride 12/29/2011 103   . CO2 12/29/2011 27   . Glucose, Bld 12/29/2011 95   . BUN 12/29/2011 13   . Creat 12/29/2011 1.06   . Total Bilirubin 12/29/2011 1.0   . Alkaline Phosphatase 12/29/2011 81   . AST 12/29/2011 25   . ALT 12/29/2011 30   . Total Protein 12/29/2011 7.2   . Albumin 12/29/2011 4.8   .  Calcium 12/29/2011 9.8   . WBC 12/29/2011 4.9   . RBC 12/29/2011 4.52   . Hemoglobin 12/29/2011 14.3   . HCT 12/29/2011 41.0   . MCV 12/29/2011 90.7   . [facility] 12/29/2011 31.6   . MCHC 12/29/2011 34.9   . RDW 12/29/2011 13.5   . Platelets 12/29/2011 314   . Cholesterol 12/29/2011 223*  . Triglycerides 12/29/2011 147   . HDL 12/29/2011 56   . Total CHOL/HDL Ratio 12/29/2011 4.0   . VLDL 12/29/2011 29   . LDL Cholesterol 12/29/2011 138*     Results for this Opt Visit:     Results for orders placed in visit on 12/29/11  COMPREHENSIVE METABOLIC PANEL      Component Value Range   Sodium 140  135 - 145 mEq/L   Potassium 4.5  3.5 - 5.3 mEq/L   Chloride 103  96 - 112 mEq/L   CO2 27  19 - 32 mEq/L   Glucose, Bld 95  70 - 99 mg/dL   BUN 13  6 - 23 mg/dL   Creat 1.06  0.50 - 1.10 mg/dL   Total Bilirubin 1.0  0.3 - 1.2 mg/dL   Alkaline Phosphatase 81  39 - 117 U/L   AST 25  0 - 37 U/L   ALT 30  0 - 35 U/L   Total Protein 7.2  6.0 - 8.3 g/dL   Albumin 4.8  3.5 - 5.2 g/dL   Calcium 9.8  8.4 - 10.5 mg/dL  CBC      Component Value  Range   WBC 4.9  4.0 - 10.5 K/uL   RBC 4.52  3.87 - 5.11 MIL/uL   Hemoglobin 14.3  12.0 - 15.0 g/dL   HCT 41.0  36.0 - 46.0 %   MCV 90.7  78.0 - 100.0 fL   MCH 31.6  26.0 - 34.0 pg   MCHC 34.9  30.0 - 36.0 g/dL   RDW 13.5  11.5 - 15.5 %   Platelets 314  150 - 400 K/uL  LIPID PANEL      Component Value Range   Cholesterol 223 (*) 0 - 200 mg/dL   Triglycerides 147  <150 mg/dL   HDL 56  >39 mg/dL   Total CHOL/HDL Ratio 4.0     VLDL 29  0 - 40 mg/dL   LDL Cholesterol 138 (*) 0 - 99 mg/dL    EKG Orders placed during the hospital encounter of 09/20/11  . EKG     Prior Assessment and Plan Problem List as of 01/31/2012            Cardiology Problems   Hyperlipidemia   Last Assessment & Plan Note   12/29/2011 Office Visit Addendum 01/02/2012 12:03 PM by Alycia Rossetti, MD    Lipids to be done  Increase zocor to 60m    Cerebrovascular disease   Last Assessment & Plan Note   11/17/2011 Office Visit Signed 11/17/2011 11:00 AM by KAlycia Rossetti MD    Remote history of hemorrhagic stroke.    Unstable angina   Essential hypertension, benign   Last Assessment & Plan Note   12/29/2011 Office Visit Signed 01/01/2012 12:44 PM by KAlycia Rossetti MD    Still has not seen cardiology, blood pressure at goal      Other   Obesity   Last Assessment & Plan Note   11/17/2011 Office Visit Signed 11/17/2011 11:01 AM by KAlycia Rossetti MD    Patient has  been working on her exercise routine with her husband    Anxiety   Last Assessment & Plan Note   12/29/2011 Office Visit Signed 01/01/2012 12:46 PM by Alycia Rossetti, MD    unchanged    Chest pain at rest   Last Assessment & Plan Note   12/29/2011 Office Visit Signed 01/01/2012 12:46 PM by Alycia Rossetti, MD    Continues to have intermittant CP, normal cath, I think there is probably a large component of anxiet and stress causing pain, nevertheless she did not have her f/u with cards, will send a new referral    Depression   Last  Assessment & Plan Note   12/29/2011 Office Visit Signed 01/01/2012 12:47 PM by Alycia Rossetti, MD    Doing well on long acting effexor    Menopause   Last Assessment & Plan Note   11/17/2011 Office Visit Signed 11/17/2011 11:02 AM by Alycia Rossetti, MD    Followed by GYN, on black co-hosh, primrose oil    Insomnia   Last Assessment & Plan Note   12/29/2011 Office Visit Signed 01/01/2012 12:46 PM by Alycia Rossetti, MD    Trial of ambien        Imaging: No results found.   FRS Calculation: Score not calculated. Missing: Total Cholesterol

## 2012-01-31 NOTE — Progress Notes (Signed)
Patient ID: [name], female   DOB: 1960-08-22, 51 y.o.   MRN: [id number]  HPI: Return visit for this nice woman originally seen for chest discomfort.  Cardiac catheterization revealed normal coronary angiography.  She has done well with little in the way of recurrent symptoms.  She has labile hypertension exacerbated by anxiety and stress.  Prior to Admission medications   Medication Sig Start Date End Date Taking? Authorizing Provider  BLACK COHOSH PO Take by mouth.   Yes Historical Provider, MD  cyclobenzaprine (FLEXERIL) 10 MG tablet Take 1 tablet (10 mg total) by mouth 3 (three) times daily as needed. For muscle spasms 11/17/11  Yes Alycia Rossetti, MD  Evening Primrose topical oil Apply topically as needed.   Yes Historical Provider, MD  LORazepam (ATIVAN) 0.5 MG tablet Take 1 tablet (0.5 mg total) by mouth daily. 01/24/12  Yes Alycia Rossetti, MD  metoprolol tartrate (LOPRESSOR) 12.5 mg TABS Take 0.5 tablets (12.5 mg total) by mouth 2 (two) times daily. 09/24/11  Yes Lendon Colonel, NP  Multiple Vitamin (MULITIVITAMIN WITH MINERALS) TABS Take 1 tablet by mouth daily.   Yes Historical Provider, MD  pantoprazole (PROTONIX) 40 MG tablet Take 1 tablet (40 mg total) by mouth daily at 12 noon. 09/24/11 09/23/12 Yes Lendon Colonel, NP  polyethylene glycol [facility] / GLYCOLAX) packet Take 17 g by mouth daily.   Yes Historical Provider, MD  simvastatin (ZOCOR) 40 MG tablet Take 1 tablet (40 mg total) by mouth at bedtime. For cholesterol 01/02/12  Yes Alycia Rossetti, MD  traZODone (DESYREL) 50 MG tablet Take 1 tablet (50 mg total) by mouth at bedtime. 11/17/11  Yes Alycia Rossetti, MD  venlafaxine XR (EFFEXOR XR) 75 MG 24 hr capsule Take 2 capsules (150 mg total) by mouth daily. 11/17/11 11/16/12 Yes Alycia Rossetti, MD  zolpidem (AMBIEN) 10 MG tablet Take 1 tablet (10 mg total) by mouth at bedtime as needed for sleep. 12/29/11 01/28/12  Alycia Rossetti, MD   Allergies  Allergen Reactions    . Morphine And Related Hives  . Promethazine Hcl     Causes patient to become Hyper     Past medical history, social history, and family history reviewed and updated.  ROS: Denies orthopnea, PND, palpitations or syncope.  Continues to have substantial anxiety and episodic depression.  All other symptoms reviewed and are negative.  PHYSICAL EXAM: BP 130/90  Pulse 68  Ht 5' 3"  (1.6 m)  Wt 73.936 kg (163 lb)  BMI 28.87 kg/m2  SpO2 98%  General-Well developed; no acute distress Body habitus-mildly overweight Neck-No JVD; no carotid bruits Lungs-clear lung fields; resonant to percussion Cardiovascular-normal PMI; normal S1 and S2 Abdomen-normal bowel sounds; soft and non-tender without masses or organomegaly Musculoskeletal-No deformities, no cyanosis or clubbing Neurologic-Normal cranial nerves; symmetric strength and tone Skin-Warm, no significant lesions Extremities-distal pulses intact; no edema  ASSESSMENT AND PLAN:  Jacqulyn Ducking, MD 01/31/2012 6:31 PM

## 2012-01-31 NOTE — Assessment & Plan Note (Signed)
Patient has been treated with a minimal dose of beta blocker, but blood pressure is well-controlled nonetheless.  Dose can be increased or additional antihypertensives added as needed.  It appears that control of anxiety and depression would be the most effective way to manage her mild hypertension.  Continued cardiology care does not appear necessary unless assistance is needed with management of risk factors.  I will be happy to see this nice woman again at any time Dr. [name] deems appropriate.

## 2012-01-31 NOTE — Patient Instructions (Signed)
Your physician recommends that you schedule a follow-up appointment in: As needed  Your physician recommends that you return for lab work in: Today

## 2012-01-31 NOTE — Assessment & Plan Note (Signed)
Simvastatin dosage increased on the basis of a suboptimal lipid profile in June 2013; repeat study will be obtained prior to patient's return visit with Dr. [name]

## 2012-02-20 ENCOUNTER — Other Ambulatory Visit: Payer: Self-pay | Admitting: Family Medicine

## 2012-03-20 ENCOUNTER — Other Ambulatory Visit: Payer: Self-pay | Admitting: Family Medicine

## 2012-03-27 ENCOUNTER — Other Ambulatory Visit: Payer: Self-pay | Admitting: Family Medicine

## 2012-03-28 ENCOUNTER — Other Ambulatory Visit: Payer: Self-pay | Admitting: Family Medicine

## 2012-03-28 ENCOUNTER — Telehealth: Payer: Self-pay | Admitting: Family Medicine

## 2012-03-28 MED ORDER — PANTOPRAZOLE SODIUM 40 MG PO TBEC: 40.0000 mg | DELAYED_RELEASE_TABLET | Freq: Every day | ORAL | Status: DC

## 2012-03-28 NOTE — Telephone Encounter (Signed)
Printed and awaiting Dr Serena Croissant

## 2012-04-02 ENCOUNTER — Ambulatory Visit: Payer: No Typology Code available for payment source | Admitting: Family Medicine

## 2012-04-03 ENCOUNTER — Encounter: Payer: Self-pay | Admitting: Family Medicine

## 2012-04-03 ENCOUNTER — Ambulatory Visit (INDEPENDENT_AMBULATORY_CARE_PROVIDER_SITE_OTHER): Payer: No Typology Code available for payment source | Admitting: Family Medicine

## 2012-04-03 VITALS — BP 108/72 | HR 87 | Resp 15 | Ht 63.0 in | Wt 162.0 lb

## 2012-04-03 DIAGNOSIS — F3289 Other specified depressive episodes: Secondary | ICD-10-CM

## 2012-04-03 DIAGNOSIS — I10 Essential (primary) hypertension: Secondary | ICD-10-CM

## 2012-04-03 DIAGNOSIS — F411 Generalized anxiety disorder: Secondary | ICD-10-CM

## 2012-04-03 DIAGNOSIS — F329 Major depressive disorder, single episode, unspecified: Secondary | ICD-10-CM

## 2012-04-03 DIAGNOSIS — E785 Hyperlipidemia, unspecified: Secondary | ICD-10-CM

## 2012-04-03 DIAGNOSIS — G4700 Insomnia, unspecified: Secondary | ICD-10-CM

## 2012-04-03 DIAGNOSIS — R079 Chest pain, unspecified: Secondary | ICD-10-CM

## 2012-04-03 DIAGNOSIS — F419 Anxiety disorder, unspecified: Secondary | ICD-10-CM

## 2012-04-03 DIAGNOSIS — F32A Depression, unspecified: Secondary | ICD-10-CM

## 2012-04-03 MED ORDER — LORAZEPAM 0.5 MG PO TABS: ORAL_TABLET | ORAL | Status: DC

## 2012-04-03 MED ORDER — TEMAZEPAM 15 MG PO CAPS: 15.0000 mg | ORAL_CAPSULE | Freq: Every evening | ORAL | Status: DC | PRN

## 2012-04-03 NOTE — Patient Instructions (Addendum)
Stop the ambien Change the ativan to twice a day as needed Try Restoril for sleep  Continue all other medications Get the labs done fasting before next visit  F/U 3 months

## 2012-04-03 NOTE — Progress Notes (Signed)
  Subjective:    Patient ID: [name], female    DOB: 04-Dec-1960, 51 y.o.   MRN: [id number]  HPI Patient presents to follow chronic medical problems. She states that her anxiety has worsened. [name] taken Effexor 75 mg twice a day. Ambien is not helping for sleep. She is having more anxiety and panic attacks during the day however does not use her Ativan during the day. She is a new job working at [employer] and she gets anxious that she cannot keep up with the pace of the work.   Review of Systems - per above   GEN- denies fatigue, fever, weight loss,weakness, recent illness HEENT- denies eye drainage, change in vision, nasal discharge, CVS- +chest pain, palpitations RESP- denies SOB, cough, wheeze ABD- denies N/V, change in stools, abd pain GU- denies dysuria, hematuria, dribbling, incontinence MSK- denies joint pain, muscle aches, injury Neuro- denies headache, dizziness, syncope, seizure activity      Objective:   Physical Exam GEN- NAD, alert and oriented x3 HEENT- PERRL, EOMI, non injected sclera, pink conjunctiva, MMM, oropharynx clear Neck- Supple, no thryomegaly CVS- RRR, no murmur RESP-CTAB ABD-NABS,soft,NT,ND EXT- No edema Pulses- Radial, DP- 2+ Psych-anxious appearing, not depressed apeparing, no SI       Assessment & Plan:

## 2012-04-04 ENCOUNTER — Encounter: Payer: Self-pay | Admitting: Family Medicine

## 2012-04-04 NOTE — Assessment & Plan Note (Signed)
Deteriorated, she has used multiple medications before, declines prozac, has been on wellbutrin, zoloft, effexor currently, ?paxil No SI,she has a lot of stress surrounding her job

## 2012-04-04 NOTE — Assessment & Plan Note (Signed)
Deteriorated, trial of restoril

## 2012-04-04 NOTE — Assessment & Plan Note (Signed)
Well controlled 

## 2012-04-04 NOTE — Assessment & Plan Note (Signed)
Recurrent CP due to anxiety and stress, normal workup this year with cath

## 2012-04-04 NOTE — Assessment & Plan Note (Signed)
Uncontrolled, she declines changing meds, can not afford to see therapist, advised her to use ativan during the day as well

## 2012-04-25 ENCOUNTER — Other Ambulatory Visit: Payer: Self-pay | Admitting: Family Medicine

## 2012-05-10 ENCOUNTER — Other Ambulatory Visit: Payer: Self-pay | Admitting: Family Medicine

## 2012-06-28 ENCOUNTER — Other Ambulatory Visit: Payer: Self-pay | Admitting: Family Medicine

## 2012-07-30 ENCOUNTER — Other Ambulatory Visit: Payer: Self-pay | Admitting: Obstetrics and Gynecology

## 2012-07-30 DIAGNOSIS — Z139 Encounter for screening, unspecified: Secondary | ICD-10-CM

## 2012-07-31 ENCOUNTER — Telehealth: Payer: Self-pay | Admitting: Family Medicine

## 2012-08-01 ENCOUNTER — Other Ambulatory Visit: Payer: Self-pay

## 2012-08-01 MED ORDER — TEMAZEPAM 15 MG PO CAPS: 15.0000 mg | ORAL_CAPSULE | Freq: Every evening | ORAL | Status: DC | PRN

## 2012-08-01 MED ORDER — LORAZEPAM 0.5 MG PO TABS: ORAL_TABLET | ORAL | Status: DC

## 2012-08-01 NOTE — Telephone Encounter (Signed)
Med printed to be faxed

## 2012-08-13 ENCOUNTER — Ambulatory Visit (INDEPENDENT_AMBULATORY_CARE_PROVIDER_SITE_OTHER): Payer: BC Managed Care – PPO | Admitting: Family Medicine

## 2012-08-13 ENCOUNTER — Encounter: Payer: Self-pay | Admitting: Family Medicine

## 2012-08-13 ENCOUNTER — Other Ambulatory Visit: Payer: Self-pay | Admitting: Family Medicine

## 2012-08-13 VITALS — BP 118/78 | HR 90 | Resp 18 | Ht 63.0 in | Wt 169.0 lb

## 2012-08-13 DIAGNOSIS — I10 Essential (primary) hypertension: Secondary | ICD-10-CM

## 2012-08-13 DIAGNOSIS — E785 Hyperlipidemia, unspecified: Secondary | ICD-10-CM

## 2012-08-13 DIAGNOSIS — M25561 Pain in right knee: Secondary | ICD-10-CM

## 2012-08-13 DIAGNOSIS — M7710 Lateral epicondylitis, unspecified elbow: Secondary | ICD-10-CM

## 2012-08-13 DIAGNOSIS — G4700 Insomnia, unspecified: Secondary | ICD-10-CM

## 2012-08-13 DIAGNOSIS — M25569 Pain in unspecified knee: Secondary | ICD-10-CM

## 2012-08-13 DIAGNOSIS — F3289 Other specified depressive episodes: Secondary | ICD-10-CM

## 2012-08-13 DIAGNOSIS — M25562 Pain in left knee: Secondary | ICD-10-CM | POA: Insufficient documentation

## 2012-08-13 DIAGNOSIS — F411 Generalized anxiety disorder: Secondary | ICD-10-CM

## 2012-08-13 DIAGNOSIS — F32A Depression, unspecified: Secondary | ICD-10-CM

## 2012-08-13 DIAGNOSIS — Z23 Encounter for immunization: Secondary | ICD-10-CM

## 2012-08-13 DIAGNOSIS — F419 Anxiety disorder, unspecified: Secondary | ICD-10-CM

## 2012-08-13 DIAGNOSIS — E663 Overweight: Secondary | ICD-10-CM

## 2012-08-13 DIAGNOSIS — K219 Gastro-esophageal reflux disease without esophagitis: Secondary | ICD-10-CM

## 2012-08-13 DIAGNOSIS — F329 Major depressive disorder, single episode, unspecified: Secondary | ICD-10-CM

## 2012-08-13 MED ORDER — PANTOPRAZOLE SODIUM 40 MG PO TBEC: 40.0000 mg | DELAYED_RELEASE_TABLET | Freq: Every day | ORAL | Status: DC

## 2012-08-13 MED ORDER — METOPROLOL TARTRATE 12.5 MG HALF TABLET: 12.5000 mg | ORAL_TABLET | Freq: Two times a day (BID) | ORAL | Status: DC

## 2012-08-13 MED ORDER — ALPRAZOLAM 0.5 MG PO TABS: 0.5000 mg | ORAL_TABLET | Freq: Two times a day (BID) | ORAL | Status: DC | PRN

## 2012-08-13 MED ORDER — SIMVASTATIN 40 MG PO TABS: 40.0000 mg | ORAL_TABLET | Freq: Every day | ORAL | Status: DC

## 2012-08-13 MED ORDER — TEMAZEPAM 30 MG PO CAPS: 30.0000 mg | ORAL_CAPSULE | Freq: Every evening | ORAL | Status: DC | PRN

## 2012-08-13 MED ORDER — MELOXICAM 15 MG PO TABS: 15.0000 mg | ORAL_TABLET | Freq: Every day | ORAL | Status: DC

## 2012-08-13 NOTE — Assessment & Plan Note (Signed)
Blood pressure well controlled on low-dose metoprolol

## 2012-08-13 NOTE — Assessment & Plan Note (Signed)
Fasting lipid panel performed this morning we'll followup continue Zocor

## 2012-08-13 NOTE — Assessment & Plan Note (Signed)
Per above she is currently stable

## 2012-08-13 NOTE — Assessment & Plan Note (Signed)
Increased temazepam 30 mg

## 2012-08-13 NOTE — Assessment & Plan Note (Signed)
She continues to have difficulty with depression and anxiety however it does state thinks that she is doing pretty well. We'll continue her on Effexor she declined seen psychiatry she cannot afford it. I will change her from Ativan to Xanax, her temazepam will be increased to 30 mg

## 2012-08-13 NOTE — Assessment & Plan Note (Signed)
Anti-inflammatory prescribed, ICE

## 2012-08-13 NOTE — Progress Notes (Signed)
  Subjective:    Patient ID: [name], female    DOB: 03-29-1961, 52 y.o.   MRN: [id number]  HPI  Patient here to follow chronic medical problems. [name] also due for medication refills. She had her labs drawn fasting this morning. 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 She tells me over the past couple much she fell into a deep depression but did not seek any help. She takes her Ativan twice a day but does not help calm her down were stopped the chest pain she gets from anxiety.  [name] had right elbow and left knee pain for the past 4 weeks on and off. She does not remember any particular injury she is right-handed. She was walking a lot on the job at [employer] which was part on her joints. [name] used some ibuprofen and some topical rubs which have not helped very much. She denies any swelling  Review of Systems - per above   GEN- denies fatigue, fever, weight loss,weakness, recent illness HEENT- denies eye drainage, change in vision, nasal discharge, CVS- denies chest pain, palpitations RESP- denies SOB, cough, wheeze ABD- denies N/V, change in stools, abd pain GU- denies dysuria, hematuria, dribbling, incontinence MSK- + joint pain, muscle aches, injury Neuro- denies headache, dizziness, syncope, seizure activity      Objective:   Physical Exam GEN- NAD, alert and oriented x3 HEENT- PERRL, EOMI, non injected sclera, pink conjunctiva, MMM, oropharynx clear CVS- RRR, no murmur RESP-CTAB MSK- Mild TTP lateral epicondyle, no swelling, no deformity of elbow, normal ROM  Left knee- Normal inspection, no effusion, no erythema, no warmth, +crepitus, ligaments in tact, TTP inferior joint line/ RIght knee- normal inspection, normal ROM, no effusion  + slight antalgic gait EXT- No edema Pulses- Radial, DP- 2+ Psych- flat affect, not overly anxious, good eye contact, no hallucinations       Assessment & Plan:

## 2012-08-13 NOTE — Assessment & Plan Note (Signed)
No specific injury no effusion possible some mild arthritis at this time we'll put her on 4 weeks of prescription strength anti-inflammatory she will ice the knee as well as the does not improve I will obtain an imaging and refer her to orthopedics

## 2012-08-13 NOTE — Patient Instructions (Addendum)
Schedule GYN exam with [facility] Increase restoril to 85m at bedtime Stop Ativan, start xanax twice a day as needed Meloxicam once a day for elbow and knee Ice both as needed Call if not improved in 4 weeks We will call with lab results F/U 3 months  Congratulations on the new job

## 2012-08-13 NOTE — Assessment & Plan Note (Signed)
Continue protonix  

## 2012-08-13 NOTE — Assessment & Plan Note (Signed)
[name] gained weight since her last visit during her period of depression or she's had very little activity. She is motivated to lose weight again

## 2012-08-14 LAB — BASIC METABOLIC PANEL
BUN: 13 mg/dL (ref 6–23)
CO2: 30 mEq/L (ref 19–32)
Calcium: 9.9 mg/dL (ref 8.4–10.5)
Chloride: 102 mEq/L (ref 96–112)
Creat: 1 mg/dL (ref 0.50–1.10)
Glucose, Bld: 111 mg/dL — ABNORMAL HIGH (ref 70–99)
Potassium: 4.5 mEq/L (ref 3.5–5.3)
Sodium: 141 mEq/L (ref 135–145)

## 2012-08-14 LAB — CBC
HCT: 40.9 % (ref 36.0–46.0)
Hemoglobin: 14.2 g/dL (ref 12.0–15.0)
MCH: 31.3 pg (ref 26.0–34.0)
MCHC: 34.7 g/dL (ref 30.0–36.0)
MCV: 90.3 fL (ref 78.0–100.0)
Platelets: 304 10*3/uL (ref 150–400)
RBC: 4.53 MIL/uL (ref 3.87–5.11)
RDW: 13.4 % (ref 11.5–15.5)
WBC: 5.4 10*3/uL (ref 4.0–10.5)

## 2012-08-14 LAB — LIPID PANEL
Cholesterol: 204 mg/dL — ABNORMAL HIGH (ref 0–200)
HDL: 57 mg/dL (ref >39)
LDL Cholesterol: 108 mg/dL — ABNORMAL HIGH (ref 0–99)
Total CHOL/HDL Ratio: 3.6 Ratio
Triglycerides: 194 mg/dL — ABNORMAL HIGH (ref <150)
VLDL: 39 mg/dL (ref 0–40)

## 2012-08-20 MED ORDER — SIMVASTATIN 80 MG PO TABS: 80.0000 mg | ORAL_TABLET | Freq: Every day | ORAL | Status: DC

## 2012-08-20 NOTE — Addendum Note (Signed)
Addended by: Vic Blackbird F on: 08/20/2012 12:47 PM   Modules accepted: Orders

## 2012-08-30 ENCOUNTER — Ambulatory Visit (HOSPITAL_COMMUNITY)
Admission: RE | Admit: 2012-08-30 | Discharge: 2012-08-30 | Disposition: A | Discharge status: Home / Self Care | Payer: BC Managed Care – PPO | Source: Ambulatory Visit | Attending: Obstetrics and Gynecology | Admitting: Obstetrics and Gynecology

## 2012-08-30 DIAGNOSIS — Z139 Encounter for screening, unspecified: Secondary | ICD-10-CM

## 2012-08-30 DIAGNOSIS — Z1231 Encounter for screening mammogram for malignant neoplasm of breast: Secondary | ICD-10-CM | POA: Insufficient documentation

## 2012-09-20 ENCOUNTER — Other Ambulatory Visit: Payer: Self-pay | Admitting: Family Medicine

## 2012-10-19 ENCOUNTER — Other Ambulatory Visit: Payer: Self-pay | Admitting: Family Medicine

## 2012-11-03 ENCOUNTER — Other Ambulatory Visit: Payer: Self-pay | Admitting: Family Medicine

## 2012-11-13 ENCOUNTER — Ambulatory Visit (INDEPENDENT_AMBULATORY_CARE_PROVIDER_SITE_OTHER): Payer: BC Managed Care – PPO | Admitting: Family Medicine

## 2012-11-13 ENCOUNTER — Encounter: Payer: Self-pay | Admitting: Family Medicine

## 2012-11-13 VITALS — BP 110/80 | HR 81 | Resp 16 | Ht 63.0 in | Wt 172.4 lb

## 2012-11-13 DIAGNOSIS — F411 Generalized anxiety disorder: Secondary | ICD-10-CM

## 2012-11-13 DIAGNOSIS — E785 Hyperlipidemia, unspecified: Secondary | ICD-10-CM

## 2012-11-13 DIAGNOSIS — F3289 Other specified depressive episodes: Secondary | ICD-10-CM

## 2012-11-13 DIAGNOSIS — F329 Major depressive disorder, single episode, unspecified: Secondary | ICD-10-CM

## 2012-11-13 DIAGNOSIS — F32A Depression, unspecified: Secondary | ICD-10-CM

## 2012-11-13 DIAGNOSIS — I10 Essential (primary) hypertension: Secondary | ICD-10-CM

## 2012-11-13 DIAGNOSIS — F419 Anxiety disorder, unspecified: Secondary | ICD-10-CM

## 2012-11-13 MED ORDER — VENLAFAXINE HCL ER 75 MG PO CP24: ORAL_CAPSULE | ORAL | Status: DC

## 2012-11-13 MED ORDER — TEMAZEPAM 30 MG PO CAPS: 30.0000 mg | ORAL_CAPSULE | Freq: Every evening | ORAL | Status: DC | PRN

## 2012-11-13 MED ORDER — HYDROCHLOROTHIAZIDE 12.5 MG PO TABS: 12.5000 mg | ORAL_TABLET | Freq: Every day | ORAL | Status: DC

## 2012-11-13 NOTE — Patient Instructions (Addendum)
Stop the beta blocker Start the low dose water pill Get the labs done fasting in the next few weeks Stay on the effexor  F/U 3 months

## 2012-11-14 LAB — COMPREHENSIVE METABOLIC PANEL
ALT: 28 U/L (ref 0–35)
AST: 23 U/L (ref 0–37)
Albumin: 4.6 g/dL (ref 3.5–5.2)
Alkaline Phosphatase: 78 U/L (ref 39–117)
BUN: 17 mg/dL (ref 6–23)
CO2: 28 mEq/L (ref 19–32)
Calcium: 9.8 mg/dL (ref 8.4–10.5)
Chloride: 104 mEq/L (ref 96–112)
Creat: 1.03 mg/dL (ref 0.50–1.10)
Glucose, Bld: 104 mg/dL — ABNORMAL HIGH (ref 70–99)
Potassium: 4.4 mEq/L (ref 3.5–5.3)
Sodium: 139 mEq/L (ref 135–145)
Total Bilirubin: 0.8 mg/dL (ref 0.3–1.2)
Total Protein: 6.9 g/dL (ref 6.0–8.3)

## 2012-11-14 LAB — LIPID PANEL
Cholesterol: 166 mg/dL (ref 0–200)
HDL: 48 mg/dL (ref >39)
LDL Cholesterol: 84 mg/dL (ref 0–99)
Total CHOL/HDL Ratio: 3.5 Ratio
Triglycerides: 169 mg/dL — ABNORMAL HIGH (ref <150)
VLDL: 34 mg/dL (ref 0–40)

## 2012-11-15 NOTE — Assessment & Plan Note (Signed)
Continue xanax

## 2012-11-15 NOTE — Progress Notes (Signed)
  Subjective:    Patient ID: [name], female    DOB: 1961/06/06, 52 y.o.   MRN: [id number]  HPI Patient here to followup her depression anxiety and high blood pressure. At her last visit she had a new job however she was let go after 1 day on job. She states that she made a comment , about her knees hurting and the supervisor at time stated that her sister had knee problems and ended up having surgery and the next thing she knew she was terminated from her temporary position. She thinks is because there was a comment made that she would not even walk up and down the steps at her job and she was a liability but she cannot verify this. she did call multiple people including the company as well as the job agency crying and upset that she lost her job   Review of [place]- denies fatigue, fever, weight loss,weakness, recent illness HEENT- denies eye drainage, change in vision, nasal discharge, CVS- denies chest pain, palpitations RESP- denies SOB, cough, wheeze ABD- denies N/V, change in stools, abd pain GU- denies dysuria, hematuria, dribbling, incontinence MSK- denies joint pain, muscle aches, injury Neuro- denies headache, dizziness, syncope, seizure activity      Objective:   Physical Exam  GEN- NAD, alert and oriented x3 HEENT- PERRL, EOMI, non injected sclera, pink conjunctiva, MMM, oropharynx clear CVS- RRR, no murmur RESP-CTAB EXT- No edema Pulses- Radial, DP- 2+ Psych- flat affect, not overly anxious, good eye contact, no hallucinations      Assessment & Plan:

## 2012-11-15 NOTE — Assessment & Plan Note (Signed)
she will have fasting labs done with increased dose of statin drug

## 2012-11-15 NOTE — Assessment & Plan Note (Signed)
She has no known history of coronary artery disease she is on a very small dose of beta blocker. Her concern is more with swelling that she gets with standing on her feet. I will stop the beta blocker and just treat her with hydrochlorothiazide

## 2012-11-15 NOTE — Assessment & Plan Note (Signed)
Unfortunately her previous job did not work out and this is been causing her to be down on herself. She continues to have difficulty finding work. She does not always have a Xanax or sleeping medication but has been taking Effexor faithfully we will continue this

## 2012-12-08 ENCOUNTER — Emergency Department (HOSPITAL_COMMUNITY)
Admission: EM | Admit: 2012-12-08 | Discharge: 2012-12-08 | Disposition: A | Discharge status: Home / Self Care | Payer: BC Managed Care – PPO | Attending: Emergency Medicine | Admitting: Emergency Medicine

## 2012-12-08 ENCOUNTER — Emergency Department (HOSPITAL_COMMUNITY): Payer: BC Managed Care – PPO

## 2012-12-08 ENCOUNTER — Encounter (HOSPITAL_COMMUNITY): Payer: Self-pay | Admitting: *Deleted

## 2012-12-08 DIAGNOSIS — Z791 Long term (current) use of non-steroidal anti-inflammatories (NSAID): Secondary | ICD-10-CM | POA: Insufficient documentation

## 2012-12-08 DIAGNOSIS — S8263XA Displaced fracture of lateral malleolus of unspecified fibula, initial encounter for closed fracture: Secondary | ICD-10-CM | POA: Insufficient documentation

## 2012-12-08 DIAGNOSIS — Z87891 Personal history of nicotine dependence: Secondary | ICD-10-CM | POA: Insufficient documentation

## 2012-12-08 DIAGNOSIS — Y92009 Unspecified place in unspecified non-institutional (private) residence as the place of occurrence of the external cause: Secondary | ICD-10-CM | POA: Insufficient documentation

## 2012-12-08 DIAGNOSIS — F329 Major depressive disorder, single episode, unspecified: Secondary | ICD-10-CM | POA: Insufficient documentation

## 2012-12-08 DIAGNOSIS — E785 Hyperlipidemia, unspecified: Secondary | ICD-10-CM | POA: Insufficient documentation

## 2012-12-08 DIAGNOSIS — S8261XA Displaced fracture of lateral malleolus of right fibula, initial encounter for closed fracture: Secondary | ICD-10-CM

## 2012-12-08 DIAGNOSIS — Y9389 Activity, other specified: Secondary | ICD-10-CM | POA: Insufficient documentation

## 2012-12-08 DIAGNOSIS — W010XXA Fall on same level from slipping, tripping and stumbling without subsequent striking against object, initial encounter: Secondary | ICD-10-CM | POA: Insufficient documentation

## 2012-12-08 DIAGNOSIS — Z8679 Personal history of other diseases of the circulatory system: Secondary | ICD-10-CM | POA: Insufficient documentation

## 2012-12-08 DIAGNOSIS — F3289 Other specified depressive episodes: Secondary | ICD-10-CM | POA: Insufficient documentation

## 2012-12-08 DIAGNOSIS — Z79899 Other long term (current) drug therapy: Secondary | ICD-10-CM | POA: Insufficient documentation

## 2012-12-08 DIAGNOSIS — K219 Gastro-esophageal reflux disease without esophagitis: Secondary | ICD-10-CM | POA: Insufficient documentation

## 2012-12-08 DIAGNOSIS — I10 Essential (primary) hypertension: Secondary | ICD-10-CM | POA: Insufficient documentation

## 2012-12-08 DIAGNOSIS — F411 Generalized anxiety disorder: Secondary | ICD-10-CM | POA: Insufficient documentation

## 2012-12-08 MED ORDER — OXYCODONE-ACETAMINOPHEN 5-325 MG PO TABS: 1.0000 | ORAL_TABLET | Freq: Four times a day (QID) | ORAL | Status: DC | PRN

## 2012-12-08 MED ORDER — KETOROLAC TROMETHAMINE 10 MG PO TABS
10.0000 mg | ORAL_TABLET | Freq: Once | ORAL | Status: AC
  Administered 2012-12-08: 10 mg via ORAL
  Filled 2012-12-08: qty 1

## 2012-12-08 MED ORDER — ONDANSETRON HCL 4 MG PO TABS
4.0000 mg | ORAL_TABLET | Freq: Once | ORAL | Status: AC
  Administered 2012-12-08: 4 mg via ORAL
  Filled 2012-12-08: qty 1

## 2012-12-08 MED ORDER — OXYCODONE-ACETAMINOPHEN 5-325 MG PO TABS
1.0000 | ORAL_TABLET | Freq: Once | ORAL | Status: AC
  Administered 2012-12-08: 1 via ORAL
  Filled 2012-12-08: qty 1

## 2012-12-08 MED ORDER — MELOXICAM 7.5 MG PO TABS: ORAL_TABLET | ORAL | Status: DC

## 2012-12-08 NOTE — ED Provider Notes (Signed)
History     CSN: [id number]  Arrival date & time 12/08/12  1925   First MD Initiated Contact with Patient 12/08/12 2030      Chief Complaint  Patient presents with  . Ankle Pain    (Consider location/radiation/quality/duration/timing/severity/associated sxs/prior treatment) Patient is a 52 y.o. female presenting with ankle pain. The history is provided by the patient.  Ankle Pain Location:  Ankle Time since incident:  2 hours Injury: yes   Mechanism of injury: fall   Fall:    Impact surface:  Grass   Entrapped after fall: no   Ankle location:  R ankle Pain details:    Quality:  Aching and throbbing   Radiates to:  Does not radiate   Severity:  Severe   Onset quality:  Sudden   Timing:  Constant   Progression:  Worsening Chronicity:  New Dislocation: no   Foreign body present:  No foreign bodies Prior injury to area:  No Relieved by:  Nothing Worsened by:  Nothing tried Ineffective treatments:  Ice Associated symptoms: no back pain, no neck pain and no numbness     Past Medical History  Diagnosis Date  . Hypertension     Mild; provoked by stress and anxiety  . Intracerebral bleed     No aneurysm; followed by Dr. Sherwood Gambler  . Hyperlipidemia   . GERD (gastroesophageal reflux disease)   . Depression   . Anxiety   . Chest pain 09/2011    Cardiac cath-normal coronaries    Past Surgical History  Procedure Laterality Date  . Abdominal hysterectomy    . Cervical fusion    . Rectocele repair      Family History  Problem Relation Age of Onset  . Coronary artery disease Paternal Grandfather   . Coronary artery disease Paternal Uncle   . Cancer Mother     breast   . Hypertension Mother   . Hyperlipidemia Mother     History  Substance Use Topics  . Smoking status: Former Smoker    Types: Cigarettes  . Smokeless tobacco: Never Used     Comment: Quit smoking 1999 , previous 20 pack years  . Alcohol Use: No    OB History   Grav Para Term Preterm  Abortions TAB SAB Ect Mult Living                  Review of Systems  Constitutional: Negative for activity change.       All ROS Neg except as noted in HPI  HENT: Negative for nosebleeds and neck pain.   Eyes: Negative for photophobia and discharge.  Respiratory: Negative for cough, shortness of breath and wheezing.   Cardiovascular: Negative for chest pain and palpitations.  Gastrointestinal: Negative for abdominal pain and blood in stool.  Genitourinary: Negative for dysuria, frequency and hematuria.  Musculoskeletal: Positive for arthralgias. Negative for back pain.  Skin: Negative.   Neurological: Negative for dizziness, seizures and speech difficulty.  Psychiatric/Behavioral: Negative for hallucinations and confusion. The patient is nervous/anxious.     Allergies  Morphine and related and Promethazine hcl  Home Medications   Current Outpatient Rx  Name  Route  Sig  Dispense  Refill  . ALPRAZolam (XANAX) 0.5 MG tablet   Oral   Take 1 tablet (0.5 mg total) by mouth 2 (two) times daily as needed for sleep or anxiety.   60 tablet   3   . BLACK COHOSH PO   Oral   Take 1 tablet  by mouth daily.          . cyclobenzaprine (FLEXERIL) 10 MG tablet   Oral   Take 10 mg by mouth at bedtime.         . Evening Primrose Oil CAPS   Oral   Take 1 capsule by mouth daily.         . hydrochlorothiazide (HYDRODIURIL) 12.5 MG tablet   Oral   Take 1 tablet (12.5 mg total) by mouth daily.   30 tablet   6   . meloxicam (MOBIC) 15 MG tablet   Oral   Take 15 mg by mouth daily.         . Multiple Vitamin (MULITIVITAMIN WITH MINERALS) TABS   Oral   Take 1 tablet by mouth daily.         . pantoprazole (PROTONIX) 40 MG tablet   Oral   Take 1 tablet (40 mg total) by mouth daily at 12 noon.   30 tablet   5   . polyethylene glycol (MIRALAX / GLYCOLAX) packet   Oral   Take 17 g by mouth daily.         . simvastatin (ZOCOR) 80 MG tablet   Oral   Take 1 tablet (80  mg total) by mouth at bedtime.   30 tablet   6     Dose change   . temazepam (RESTORIL) 30 MG capsule   Oral   Take 1 capsule (30 mg total) by mouth at bedtime as needed for sleep.   30 capsule   3   . venlafaxine XR (EFFEXOR-XR) 75 MG 24 hr capsule   Oral   Take 150 mg by mouth daily.         . meloxicam (MOBIC) 7.5 MG tablet      1 po bid with food   12 tablet   0   . oxyCODONE-acetaminophen (PERCOCET/ROXICET) 5-325 MG per tablet   Oral   Take 1 tablet by mouth every 6 (six) hours as needed for pain.   20 tablet   0     BP 137/88  Pulse 87  Temp(Src) 98.6 F (37 C)  Resp 20  Ht 5' 4"  (1.626 m)  Wt 165 lb (74.844 kg)  BMI 28.31 kg/m2  SpO2 99%  Physical Exam  Nursing note and vitals reviewed. Constitutional: She is oriented to person, place, and time. She appears well-developed and well-nourished.  Non-toxic appearance.  HENT:  Head: Normocephalic.  Right Ear: Tympanic membrane and external ear normal.  Left Ear: Tympanic membrane and external ear normal.  Eyes: EOM and lids are normal. Pupils are equal, round, and reactive to light.  Neck: Normal range of motion. Neck supple. Carotid bruit is not present.  Cardiovascular: Normal rate, regular rhythm, normal heart sounds, intact distal pulses and normal pulses.   Pulmonary/Chest: Breath sounds normal. No respiratory distress.  Abdominal: Soft. Bowel sounds are normal. There is no tenderness. There is no guarding.  Musculoskeletal: Normal range of motion.  Is good range of motion of the right hip and knee. There is no pain to percussion of the right tibia. There is pain to the lateral malleolus with some swelling present. There is full range of motion of the toes of the right foot. The dorsalis pedis and posterior tibial pulses are 2+.  Lymphadenopathy:       Head (right side): No submandibular adenopathy present.       Head (left side): No submandibular adenopathy present.  She has no cervical adenopathy.   Neurological: She is alert and oriented to person, place, and time. She has normal strength. No cranial nerve deficit or sensory deficit.  Skin: Skin is warm and dry.  Psychiatric: She has a normal mood and affect. Her speech is normal.    ED Course  Procedures (including critical care time)  Labs Reviewed - No data to display Dg Ankle Complete Right  12/08/2012   *RADIOLOGY REPORT*  Clinical Data: Ankle injury and pain.  RIGHT ANKLE - COMPLETE 3+ VIEW  Comparison: None  Findings: A nondisplaced transverse fracture of the distal fibula is noted. Mild soft tissue swelling is present. There is no evidence of talus fracture. There is no evidence of subluxation or dislocation. An ankle effusion is present.  IMPRESSION: Nondisplaced transverse lateral malleolar fracture.   Original Report Authenticated By: Margarette Canada, M.D.     1. Ankle fracture, lateral malleolus, closed, right, initial encounter       MDM  I have reviewed nursing notes, vital signs, and all appropriate lab and imaging results for this patient. Patient states she has a history of" weak ankles". She twisted her ankles frequently and at times she has falls. Today she had a fall and felt a pop in her right ankle. X-ray of the right ankle reveals a nondisplaced transverse medial malleoli or fracture. The patient was fitted with a posterior splint and crutches. Patient is advised to apply ice, elevate the extremity, and to use Mobic and Percocet for pain. Prescription for Mobic 2 times daily with food, and Percocet one every 6 hours #20 given to the patient. Patient will see the orthopedics for evaluation of her fracture.       Lenox Ahr, PA-C 12/08/12 2123

## 2012-12-08 NOTE — ED Notes (Signed)
Pt tripped at home now c/o right ankle pain.

## 2012-12-08 NOTE — ED Provider Notes (Signed)
Medical screening examination/treatment/procedure(s) were performed by non-physician practitioner and as supervising physician I was immediately available for consultation/collaboration.   Maudry Diego, MD 12/08/12 [phone]

## 2013-01-02 ENCOUNTER — Other Ambulatory Visit: Payer: Self-pay | Admitting: Family Medicine

## 2013-01-02 NOTE — Telephone Encounter (Signed)
Med refilled.

## 2013-01-02 NOTE — Telephone Encounter (Signed)
yes

## 2013-01-02 NOTE — Telephone Encounter (Signed)
?  ok to refill °

## 2013-02-08 ENCOUNTER — Ambulatory Visit: Payer: Self-pay | Admitting: Family Medicine

## 2013-02-12 ENCOUNTER — Encounter: Payer: Self-pay | Admitting: Family Medicine

## 2013-02-12 ENCOUNTER — Ambulatory Visit (INDEPENDENT_AMBULATORY_CARE_PROVIDER_SITE_OTHER): Payer: BC Managed Care – PPO | Admitting: Family Medicine

## 2013-02-12 VITALS — BP 126/88 | HR 68 | Temp 97.0°F | Resp 20 | Wt 164.0 lb

## 2013-02-12 DIAGNOSIS — R739 Hyperglycemia, unspecified: Secondary | ICD-10-CM

## 2013-02-12 DIAGNOSIS — R7309 Other abnormal glucose: Secondary | ICD-10-CM

## 2013-02-12 DIAGNOSIS — R202 Paresthesia of skin: Secondary | ICD-10-CM | POA: Insufficient documentation

## 2013-02-12 DIAGNOSIS — F3289 Other specified depressive episodes: Secondary | ICD-10-CM

## 2013-02-12 DIAGNOSIS — F329 Major depressive disorder, single episode, unspecified: Secondary | ICD-10-CM

## 2013-02-12 DIAGNOSIS — Z23 Encounter for immunization: Secondary | ICD-10-CM

## 2013-02-12 DIAGNOSIS — I10 Essential (primary) hypertension: Secondary | ICD-10-CM

## 2013-02-12 DIAGNOSIS — F32A Depression, unspecified: Secondary | ICD-10-CM

## 2013-02-12 DIAGNOSIS — R209 Unspecified disturbances of skin sensation: Secondary | ICD-10-CM

## 2013-02-12 LAB — BASIC METABOLIC PANEL
BUN: 13 mg/dL (ref 6–23)
CO2: 31 mEq/L (ref 19–32)
Calcium: 10.4 mg/dL (ref 8.4–10.5)
Chloride: 101 mEq/L (ref 96–112)
Creat: 1.07 mg/dL (ref 0.50–1.10)
Glucose, Bld: 113 mg/dL — ABNORMAL HIGH (ref 70–99)
Potassium: 4.5 mEq/L (ref 3.5–5.3)
Sodium: 140 mEq/L (ref 135–145)

## 2013-02-12 LAB — HEMOGLOBIN A1C
Hgb A1c MFr Bld: 5.6 % (ref <5.7)
Mean Plasma Glucose: 114 mg/dL (ref <117)

## 2013-02-12 MED ORDER — POLYETHYLENE GLYCOL 3350 17 GM/SCOOP PO POWD: 17.0000 g | Freq: Every day | ORAL | Status: DC

## 2013-02-12 NOTE — Assessment & Plan Note (Signed)
Blood pressure is well-controlled on hydrochlorothiazide we'll continue

## 2013-02-12 NOTE — Patient Instructions (Addendum)
We will call with lab results Tetanus Booster Continue all other medication F/U 4 month

## 2013-02-12 NOTE — Assessment & Plan Note (Signed)
She appears to be doing well now that she had another fairly stable job. We'll continue current dose of Effexor

## 2013-02-12 NOTE — Assessment & Plan Note (Signed)
Her fasting blood sugar was very minimally elevated. We will obtain an A1c with her concerns

## 2013-02-12 NOTE — Assessment & Plan Note (Signed)
Very small area of decreased sensation. This might have been some compression from the casting that she had to wear. At this time we will just monitor her symptoms

## 2013-02-12 NOTE — Progress Notes (Signed)
  Subjective:    Patient ID: [name], female    DOB: 08/26/1960, 52 y.o.   MRN: [id number]  HPI  Patient here to follow chronic medical problems. She sustained a fall with a right ankle fracture in June is currently followed by orthopedics. She was in casting for about 6 weeks total. She does complain of some numbness on the top of her right great toe and first toe since the event. She denies any color changes. Denies any burning sensation. She does have a new job now at [employer] and is very content with this. She restart work this week therefore is had some swelling and soreness in her ankle. She was concerned about diabetes mellitus her last the labs were reviewed.   Her medications were reviewed she's doing well on the hydrochlorothiazide which was added at the last visit secondary to some mild swelling and hypertension  Review of Systems  GEN- denies fatigue, fever, weight loss,weakness, recent illness HEENT- denies eye drainage, change in vision, nasal discharge, CVS- denies chest pain, palpitations RESP- denies SOB, cough, wheeze ABD- denies N/V, change in stools, abd pain GU- denies dysuria, hematuria, dribbling, incontinence MSK- + joint pain, muscle aches, injury Neuro- denies headache, dizziness, syncope, seizure activity      Objective:   Physical Exam GEN- NAD, alert and oriented x3 HEENT- PERRL, EOMI, non injected sclera, pink conjunctiva, MMM, oropharynx clear Neck- Supple, no thyromegaly CVS- RRR, no murmur RESP-CTAB EXT- No edema Pulses- Radial, DP- 2+ NEURO- Slightly decreased monofilament on top of Right great toe and 1st toe, soles in tact Psych- flat affect, otherwise normal mood       Assessment & Plan:

## 2013-03-08 ENCOUNTER — Telehealth: Payer: Self-pay | Admitting: Family Medicine

## 2013-03-08 MED ORDER — PANTOPRAZOLE SODIUM 40 MG PO TBEC: 40.0000 mg | DELAYED_RELEASE_TABLET | Freq: Every day | ORAL | Status: DC

## 2013-03-08 NOTE — Telephone Encounter (Signed)
Meds refilled.

## 2013-03-08 NOTE — Telephone Encounter (Signed)
Pantoprazole DR 40 mg tab 1 at 12 noon #30

## 2013-04-01 ENCOUNTER — Other Ambulatory Visit: Payer: Self-pay | Admitting: Family Medicine

## 2013-04-01 NOTE — Telephone Encounter (Signed)
Ok to refill 

## 2013-04-01 NOTE — Telephone Encounter (Signed)
Okay to refill? 

## 2013-04-02 NOTE — Telephone Encounter (Signed)
rx called in

## 2013-04-08 ENCOUNTER — Telehealth: Payer: Self-pay | Admitting: Family Medicine

## 2013-04-08 NOTE — Telephone Encounter (Signed)
Okay to refill + 3

## 2013-04-08 NOTE — Telephone Encounter (Signed)
Meloxicam 86m #30  Last Rf 03/08/13

## 2013-04-08 NOTE — Telephone Encounter (Signed)
Ok to refill 

## 2013-04-09 MED ORDER — MELOXICAM 15 MG PO TABS: 15.0000 mg | ORAL_TABLET | Freq: Every day | ORAL | Status: DC

## 2013-04-09 NOTE — Telephone Encounter (Signed)
Meds refilled.

## 2013-04-18 ENCOUNTER — Telehealth: Payer: Self-pay | Admitting: Family Medicine

## 2013-04-18 ENCOUNTER — Other Ambulatory Visit: Payer: Self-pay | Admitting: Family Medicine

## 2013-04-18 MED ORDER — TEMAZEPAM 30 MG PO CAPS: 30.0000 mg | ORAL_CAPSULE | Freq: Every evening | ORAL | Status: DC | PRN

## 2013-04-18 NOTE — Telephone Encounter (Signed)
Okay to refill, give 2 extra 

## 2013-04-18 NOTE — Telephone Encounter (Signed)
rx called in

## 2013-04-18 NOTE — Telephone Encounter (Signed)
Temazepam 30 mg HS PRN   Last RF 03/16/13  OK refill?

## 2013-05-15 ENCOUNTER — Telehealth: Payer: Self-pay | Admitting: *Deleted

## 2013-05-15 MED ORDER — CYCLOBENZAPRINE HCL 10 MG PO TABS: ORAL_TABLET | ORAL | Status: DC

## 2013-05-15 NOTE — Telephone Encounter (Signed)
Med phoned in °

## 2013-05-15 NOTE — Telephone Encounter (Signed)
Okay to refill 3

## 2013-05-15 NOTE — Telephone Encounter (Signed)
Flexeril 56m take one tablet my mouth TID prn for muscle spasms

## 2013-05-24 ENCOUNTER — Other Ambulatory Visit: Payer: Self-pay | Admitting: Family Medicine

## 2013-05-24 ENCOUNTER — Telehealth: Payer: Self-pay | Admitting: Family Medicine

## 2013-05-24 NOTE — Telephone Encounter (Signed)
This was refilled on 05/15/13 pharmacy verified

## 2013-05-24 NOTE — Telephone Encounter (Signed)
Patient needs refill Flexeril .

## 2013-05-24 NOTE — Telephone Encounter (Signed)
Pt aware to call pharmacy and see if there

## 2013-05-28 NOTE — Telephone Encounter (Signed)
Refill denied.  Just Rf 11/12 #45 + 3

## 2013-05-29 ENCOUNTER — Encounter: Payer: Self-pay | Admitting: Family Medicine

## 2013-05-29 ENCOUNTER — Ambulatory Visit (INDEPENDENT_AMBULATORY_CARE_PROVIDER_SITE_OTHER): Payer: BC Managed Care – PPO | Admitting: Family Medicine

## 2013-05-29 VITALS — BP 128/82 | HR 82 | Temp 97.9°F | Resp 17 | Ht 64.0 in | Wt 160.0 lb

## 2013-05-29 DIAGNOSIS — F329 Major depressive disorder, single episode, unspecified: Secondary | ICD-10-CM

## 2013-05-29 DIAGNOSIS — F32A Depression, unspecified: Secondary | ICD-10-CM

## 2013-05-29 DIAGNOSIS — R39198 Other difficulties with micturition: Secondary | ICD-10-CM

## 2013-05-29 DIAGNOSIS — F411 Generalized anxiety disorder: Secondary | ICD-10-CM

## 2013-05-29 DIAGNOSIS — R3989 Other symptoms and signs involving the genitourinary system: Secondary | ICD-10-CM

## 2013-05-29 DIAGNOSIS — F3289 Other specified depressive episodes: Secondary | ICD-10-CM

## 2013-05-29 DIAGNOSIS — M549 Dorsalgia, unspecified: Secondary | ICD-10-CM

## 2013-05-29 DIAGNOSIS — E785 Hyperlipidemia, unspecified: Secondary | ICD-10-CM

## 2013-05-29 DIAGNOSIS — F419 Anxiety disorder, unspecified: Secondary | ICD-10-CM

## 2013-05-29 DIAGNOSIS — R350 Frequency of micturition: Secondary | ICD-10-CM

## 2013-05-29 MED ORDER — ALPRAZOLAM 0.5 MG PO TABS: ORAL_TABLET | ORAL | Status: DC

## 2013-05-29 MED ORDER — CYCLOBENZAPRINE HCL 10 MG PO TABS: ORAL_TABLET | ORAL | Status: DC

## 2013-05-29 MED ORDER — OXYCODONE-ACETAMINOPHEN 5-325 MG PO TABS: 1.0000 | ORAL_TABLET | Freq: Four times a day (QID) | ORAL | Status: DC | PRN

## 2013-05-29 MED ORDER — SIMVASTATIN 80 MG PO TABS: ORAL_TABLET | ORAL | Status: DC

## 2013-05-29 MED ORDER — PANTOPRAZOLE SODIUM 40 MG PO TBEC: 40.0000 mg | DELAYED_RELEASE_TABLET | Freq: Every day | ORAL | Status: DC

## 2013-05-29 MED ORDER — TEMAZEPAM 30 MG PO CAPS: 30.0000 mg | ORAL_CAPSULE | Freq: Every evening | ORAL | Status: DC | PRN

## 2013-05-29 MED ORDER — MELOXICAM 15 MG PO TABS: 15.0000 mg | ORAL_TABLET | Freq: Every day | ORAL | Status: DC

## 2013-05-29 MED ORDER — VENLAFAXINE HCL ER 75 MG PO CP24: 150.0000 mg | ORAL_CAPSULE | Freq: Every day | ORAL | Status: DC

## 2013-05-29 MED ORDER — HYDROCHLOROTHIAZIDE 12.5 MG PO TABS: 12.5000 mg | ORAL_TABLET | Freq: Every day | ORAL | Status: DC

## 2013-05-29 NOTE — Progress Notes (Signed)
   Subjective:    Patient ID: [name], female    DOB: 10/21/1960, 52 y.o.   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 Has up and down days. No suicidal ideation. [name] often upset by the fact that she cannot work her regular job secondary to weakness and pain. She had a hemorrhagic stroke in the remote past and felt like she has not been missing since then  She also tells me that she gets a yeast rash beneath her breasts has been using cornstarch powder  Review of Systems   GEN- denies fatigue, fever, weight loss,weakness, recent illness HEENT- denies eye drainage, change in vision, nasal discharge, CVS- denies chest pain, palpitations RESP- denies SOB, cough, wheeze ABD- denies N/V, change in stools, abd pain GU- denies dysuria, hematuria, dribbling, incontinence MSK- + joint pain, muscle aches, injury Neuro- denies headache, dizziness, syncope, seizure activity      Objective:   Physical Exam GEN- NAD, alert and oriented x3 HEENT- PERRL, EOMI, non injected sclera, pink conjunctiva, MMM, oropharynx clear Neck- Supple, no thryomegaly CVS- RRR, no murmur RESP-CTAB MSK- back- Spine NT, +paraspinal tenderness, neg SLR- FAir ROM ABD-NABS,soft,NT,ND,mild suprapubic tenderness, no rebound, bladder no palpated, no CVA tenderness EXT- No edema Pulses- Radial, DP- 2+ Neuro- CNII-XII in tact no focal deficits Psych- flat affect, not anxious appearing Skin- mild erythema beneath breast, no rash seen       Assessment & Plan:

## 2013-05-29 NOTE — Patient Instructions (Addendum)
Percocet for back pain Continue meloxicam as needed Medications refilled F/U in 4 months

## 2013-05-31 ENCOUNTER — Other Ambulatory Visit: Payer: BC Managed Care – PPO

## 2013-05-31 ENCOUNTER — Encounter: Payer: Self-pay | Admitting: Family Medicine

## 2013-05-31 DIAGNOSIS — R350 Frequency of micturition: Secondary | ICD-10-CM

## 2013-05-31 DIAGNOSIS — R39198 Other difficulties with micturition: Secondary | ICD-10-CM

## 2013-05-31 HISTORY — DX: Other difficulties with micturition: R39.198

## 2013-05-31 LAB — URINALYSIS, MICROSCOPIC ONLY: Crystals: NONE SEEN

## 2013-05-31 LAB — URINALYSIS, ROUTINE W REFLEX MICROSCOPIC
Bilirubin Urine: NEGATIVE
Glucose, UA: NEGATIVE mg/dL
Hgb urine dipstick: NEGATIVE
Ketones, ur: NEGATIVE mg/dL
Nitrite: NEGATIVE
Protein, ur: NEGATIVE mg/dL
Specific Gravity, Urine: 1.015 (ref 1.005–1.030)
Urobilinogen, UA: 0.2 mg/dL (ref 0.0–1.0)
pH: 6 (ref 5.0–8.0)

## 2013-05-31 MED ORDER — NYSTATIN 100000 UNIT/GM EX POWD: CUTANEOUS | Status: DC

## 2013-05-31 NOTE — Assessment & Plan Note (Signed)
She is not diabetic. She shows no signs of urinary retention at this time. She did bring back and a urinalysis there was some trace leukocytes and a few bacteria I will culture this. I do not think that the back pains cause of the urinary problems as this preceded the back pain. I will have her cut back on the use of the Flexeril in case this is causing some retention.

## 2013-05-31 NOTE — Assessment & Plan Note (Addendum)
Effexor and Xanax. We discussed psychiatry return in the past she found no benefit from this. Today she is jumping all were in with multiple complaints which is not her typical. She often has to flat affect and depressed mood which I've never seen any different from her. I notice because she is followed for disability she wants to remind me of all of her problems are what is truly going on or if it is because she is losing her health insurance

## 2013-05-31 NOTE — Assessment & Plan Note (Signed)
Continue Effexor and Xanax

## 2013-05-31 NOTE — Assessment & Plan Note (Signed)
normal MRI of back in 2013. Her exam is unremarkable no red flags. I will give her a short course of pain medication she also has Flexeril

## 2013-05-31 NOTE — Assessment & Plan Note (Signed)
She is doing well on the Zocor we'll continue

## 2013-06-03 ENCOUNTER — Telehealth: Payer: Self-pay | Admitting: Family Medicine

## 2013-06-03 NOTE — Telephone Encounter (Signed)
Remind her for her examinations, make sure she is prepared with her medical history, be polite , answer all questions they ask especially about her depression and her pain. She can wear her regular clothing, she does not have to dress up.

## 2013-06-03 NOTE — Telephone Encounter (Signed)
Please call her she has questions  [phone]

## 2013-06-03 NOTE — Telephone Encounter (Signed)
Called pt back and she wanted to know what your advise you had told her about going in for disability, she said you had mentioned something about her attitude and the way she dressed, she said she could not remember what all you said.

## 2013-06-03 NOTE — Telephone Encounter (Signed)
Pt aware of message

## 2013-06-05 LAB — URINE CULTURE
Colony Count: NO GROWTH
Organism ID, Bacteria: NO GROWTH

## 2013-06-18 ENCOUNTER — Ambulatory Visit: Payer: BC Managed Care – PPO | Admitting: Family Medicine

## 2013-08-06 ENCOUNTER — Other Ambulatory Visit: Payer: Self-pay | Admitting: Obstetrics and Gynecology

## 2013-08-06 DIAGNOSIS — Z139 Encounter for screening, unspecified: Secondary | ICD-10-CM

## 2013-08-14 ENCOUNTER — Encounter: Payer: Self-pay | Admitting: Family Medicine

## 2013-08-14 ENCOUNTER — Ambulatory Visit (INDEPENDENT_AMBULATORY_CARE_PROVIDER_SITE_OTHER): Payer: BC Managed Care – PPO | Admitting: Family Medicine

## 2013-08-14 VITALS — BP 130/88 | HR 80 | Temp 98.6°F | Resp 20 | Ht 64.0 in | Wt 163.0 lb

## 2013-08-14 DIAGNOSIS — D171 Benign lipomatous neoplasm of skin and subcutaneous tissue of trunk: Secondary | ICD-10-CM

## 2013-08-14 DIAGNOSIS — D1739 Benign lipomatous neoplasm of skin and subcutaneous tissue of other sites: Secondary | ICD-10-CM

## 2013-08-14 DIAGNOSIS — J029 Acute pharyngitis, unspecified: Secondary | ICD-10-CM

## 2013-08-14 DIAGNOSIS — J069 Acute upper respiratory infection, unspecified: Secondary | ICD-10-CM

## 2013-08-14 DIAGNOSIS — D239 Other benign neoplasm of skin, unspecified: Secondary | ICD-10-CM

## 2013-08-14 DIAGNOSIS — G4700 Insomnia, unspecified: Secondary | ICD-10-CM

## 2013-08-14 LAB — RAPID STREP SCREEN (MED CTR MEBANE ONLY): Streptococcus, Group A Screen (Direct): NEGATIVE

## 2013-08-14 MED ORDER — IBUPROFEN 600 MG PO TABS: 600.0000 mg | ORAL_TABLET | Freq: Four times a day (QID) | ORAL | Status: DC | PRN

## 2013-08-14 MED ORDER — FIRST-DUKES MOUTHWASH MT SUSP: OROMUCOSAL | Status: DC

## 2013-08-14 NOTE — Assessment & Plan Note (Signed)
Benign on exam will monitor

## 2013-08-14 NOTE — Assessment & Plan Note (Signed)
Appears to be benign skin tumor beneath skin, as it it growing and pt is very weary and wants it removed even though i think this is benign Will refer to have removed

## 2013-08-14 NOTE — Patient Instructions (Addendum)
Use the xanax at bedtime as well Continue temezpam Referral to general surgery  Use the dukes magic mouthwash  Okay to use Motrin F/u 2 months

## 2013-08-14 NOTE — Assessment & Plan Note (Signed)
On review of her medication, she has been using 72m of restoril some nights, advised 368mis max, she can take her xanax before bedtime to help her relax as well. She has tried other meds and failed

## 2013-08-14 NOTE — Progress Notes (Signed)
Patient ID: [name], female   DOB: 06-01-1961, 53 y.o.   MRN: [id number]   Subjective:    Patient ID: [name], female    DOB: 03-14-1961, 53 y.o.   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 No fever.Minimal cough She has also noticed to lumps, one on her left thigh which has been increasing in size, she is worried about cancer and wants it removed, the knot on her abdomen she noticed yesterday while getting out of the shower- also non tender   Review Of Systems: per above  GEN- denies fatigue, fever, weight loss,weakness, recent illness HEENT- denies eye drainage, change in vision, +nasal discharge, CVS- denies chest pain, palpitations RESP- denies SOB,+ cough, wheeze ABD- denies N/V, change in stools, abd pain GU- denies dysuria, hematuria, dribbling, incontinence MSK- denies joint pain, muscle aches, injury Neuro- denies headache, dizziness, syncope, seizure activity       Objective:    BP 130/88  Pulse 80  Temp(Src) 98.6 F (37 C) (Oral)  Resp 20  Ht 5' 4"  (1.626 m)  Wt 163 lb (73.936 kg)  BMI 27.97 kg/m2 GEN- NAD, alert and oriented x3 HEENT- PERRL, EOMI, non injected sclera, pink conjunctiva, MMM, oropharynx clear, TM clear rhinorrhea, no sinus tenderness, bilat TM clear, canal clear  Neck- Supple, no LAD CVS- RRR, no murmur RESP-CTAB ABD-NABS,soft,NT,ND Skin- lipoma on right side wall, Left Thigh pea sized knot, non tender   Strep Negative      Assessment & Plan:      Problem List Items Addressed This Visit   None    Visit Diagnoses   Sore throat    -  Primary    Relevant Orders       Rapid Strep Screen (Completed)       Note: This dictation was prepared with Dragon dictation along with smaller phrase technology. Any transcriptional errors that result from this  process are unintentional.

## 2013-09-02 ENCOUNTER — Ambulatory Visit (HOSPITAL_COMMUNITY)
Admission: RE | Admit: 2013-09-02 | Discharge: 2013-09-02 | Disposition: A | Discharge status: Home / Self Care | Payer: BC Managed Care – PPO | Source: Ambulatory Visit | Attending: Obstetrics and Gynecology | Admitting: Obstetrics and Gynecology

## 2013-09-02 DIAGNOSIS — Z1231 Encounter for screening mammogram for malignant neoplasm of breast: Secondary | ICD-10-CM | POA: Insufficient documentation

## 2013-09-02 DIAGNOSIS — Z139 Encounter for screening, unspecified: Secondary | ICD-10-CM

## 2013-09-04 ENCOUNTER — Telehealth: Payer: Self-pay | Admitting: Obstetrics and Gynecology

## 2013-09-04 NOTE — Telephone Encounter (Signed)
Pt states Dr Glo Herring has done two rectocele surgeries on her and was told that she has a wedge that can been taken down some if the pain consists with intercourse.  She was just wanting to know if that was something that he could do in the office, I explained to her that she would need to come in and see Dr Glo Herring for and exam and they can discuss options at that time but that if she did decide to have the procedure done that it would be an outpatient procedure not something that would be done here in the office.  Pt states she needs to make an appointment with him anyway because it has been over a year since she has been in.  Offered to transfer her to the front staff to make the appt but she states she will call back and schedule after she talks to her husband.

## 2013-10-14 ENCOUNTER — Telehealth: Payer: Self-pay | Admitting: *Deleted

## 2013-10-14 MED ORDER — ALPRAZOLAM 0.5 MG PO TABS: ORAL_TABLET | ORAL | Status: DC

## 2013-10-14 NOTE — Telephone Encounter (Signed)
Ok to refill Xanax??  Last office visit 08/14/2013.  Last refill 05/29/2013.

## 2013-10-14 NOTE — Telephone Encounter (Signed)
Medication called to pharmacy. 

## 2013-10-14 NOTE — Telephone Encounter (Signed)
Okay to refill? 

## 2013-10-15 ENCOUNTER — Ambulatory Visit (INDEPENDENT_AMBULATORY_CARE_PROVIDER_SITE_OTHER): Payer: BC Managed Care – PPO | Admitting: Family Medicine

## 2013-10-15 ENCOUNTER — Encounter: Payer: Self-pay | Admitting: Family Medicine

## 2013-10-15 ENCOUNTER — Ambulatory Visit: Payer: BC Managed Care – PPO | Admitting: Family Medicine

## 2013-10-15 VITALS — BP 136/88 | HR 88 | Temp 97.9°F | Resp 14 | Ht 64.5 in | Wt 161.0 lb

## 2013-10-15 DIAGNOSIS — G4700 Insomnia, unspecified: Secondary | ICD-10-CM

## 2013-10-15 DIAGNOSIS — E785 Hyperlipidemia, unspecified: Secondary | ICD-10-CM

## 2013-10-15 DIAGNOSIS — F329 Major depressive disorder, single episode, unspecified: Secondary | ICD-10-CM

## 2013-10-15 DIAGNOSIS — I10 Essential (primary) hypertension: Secondary | ICD-10-CM

## 2013-10-15 DIAGNOSIS — F32A Depression, unspecified: Secondary | ICD-10-CM

## 2013-10-15 DIAGNOSIS — F3289 Other specified depressive episodes: Secondary | ICD-10-CM

## 2013-10-15 LAB — LIPID PANEL
Cholesterol: 170 mg/dL (ref 0–200)
HDL: 56 mg/dL (ref >39)
LDL Cholesterol: 73 mg/dL (ref 0–99)
Total CHOL/HDL Ratio: 3 Ratio
Triglycerides: 203 mg/dL — ABNORMAL HIGH (ref <150)
VLDL: 41 mg/dL — ABNORMAL HIGH (ref 0–40)

## 2013-10-15 LAB — CBC WITH DIFFERENTIAL/PLATELET
Basophils Absolute: 0 10*3/uL (ref 0.0–0.1)
Basophils Relative: 0 % (ref 0–1)
Eosinophils Absolute: 0.1 10*3/uL (ref 0.0–0.7)
Eosinophils Relative: 2 % (ref 0–5)
HCT: 40.7 % (ref 36.0–46.0)
Hemoglobin: 14.5 g/dL (ref 12.0–15.0)
Lymphocytes Relative: 39 % (ref 12–46)
Lymphs Abs: 2.1 10*3/uL (ref 0.7–4.0)
MCH: 31.9 pg (ref 26.0–34.0)
MCHC: 35.6 g/dL (ref 30.0–36.0)
MCV: 89.6 fL (ref 78.0–100.0)
Monocytes Absolute: 0.4 10*3/uL (ref 0.1–1.0)
Monocytes Relative: 8 % (ref 3–12)
Neutro Abs: 2.8 10*3/uL (ref 1.7–7.7)
Neutrophils Relative %: 51 % (ref 43–77)
Platelets: 293 10*3/uL (ref 150–400)
RBC: 4.54 MIL/uL (ref 3.87–5.11)
RDW: 13.1 % (ref 11.5–15.5)
WBC: 5.4 10*3/uL (ref 4.0–10.5)

## 2013-10-15 LAB — COMPREHENSIVE METABOLIC PANEL
ALT: 69 U/L — ABNORMAL HIGH (ref 0–35)
AST: 40 U/L — ABNORMAL HIGH (ref 0–37)
Albumin: 4.6 g/dL (ref 3.5–5.2)
Alkaline Phosphatase: 72 U/L (ref 39–117)
BUN: 18 mg/dL (ref 6–23)
CO2: 30 mEq/L (ref 19–32)
Calcium: 10 mg/dL (ref 8.4–10.5)
Chloride: 100 mEq/L (ref 96–112)
Creat: 1.03 mg/dL (ref 0.50–1.10)
Glucose, Bld: 102 mg/dL — ABNORMAL HIGH (ref 70–99)
Potassium: 4.1 mEq/L (ref 3.5–5.3)
Sodium: 140 mEq/L (ref 135–145)
Total Bilirubin: 1.2 mg/dL (ref 0.2–1.2)
Total Protein: 7 g/dL (ref 6.0–8.3)

## 2013-10-15 MED ORDER — VENLAFAXINE HCL ER 75 MG PO CP24: 150.0000 mg | ORAL_CAPSULE | Freq: Every day | ORAL | Status: DC

## 2013-10-15 MED ORDER — CYCLOBENZAPRINE HCL 10 MG PO TABS: ORAL_TABLET | ORAL | Status: DC

## 2013-10-15 MED ORDER — ARIPIPRAZOLE 5 MG PO TABS: 5.0000 mg | ORAL_TABLET | Freq: Every day | ORAL | Status: DC

## 2013-10-15 MED ORDER — SIMVASTATIN 80 MG PO TABS: ORAL_TABLET | ORAL | Status: DC

## 2013-10-15 MED ORDER — TEMAZEPAM 30 MG PO CAPS: 30.0000 mg | ORAL_CAPSULE | Freq: Every evening | ORAL | Status: DC | PRN

## 2013-10-15 NOTE — Progress Notes (Signed)
Patient ID: [name], female   DOB: [birth year], 53 y.o.   MRN: [id number]   Subjective:    Patient ID: [name], female    DOB: February 19, 1961, 53 y.o.   MRN: [id number]  Patient presents for 2 month F/U  patient here follow chronic medical problems. She continues to suffer with depression and anxiety. She is taking her medications as prescribed states that she is still sad and 5 self crying and not sleeping well. She tried to get on disability but is having some difficulty. She is upset because she cannot work because she is still weak from a very remote stroke. She also has a lot of joint pains.  Hyperlipidemia she is due for repeat fasting labs. [name] currently on Zocor.  Hypertension she's not had any difficulties with her medications.    Review Of Systems:  GEN- denies fatigue, fever, weight loss,weakness, recent illness HEENT- denies eye drainage, change in vision, nasal discharge, CVS- denies chest pain, palpitations RESP- denies SOB, cough, wheeze ABD- denies N/V, change in stools, abd pain GU- denies dysuria, hematuria, dribbling, incontinence MSK- denies joint pain, muscle aches, injury Neuro- denies headache, dizziness, syncope, seizure activity       Objective:    BP 136/88  Pulse 88  Temp(Src) 97.9 F (36.6 C) (Oral)  Resp 14  Ht 5' 4.5" (1.638 m)  Wt 161 lb (73.029 kg)  BMI 27.22 kg/m2 GEN- NAD, alert and oriented x3 HEENT- PERRL, EOMI, non injected sclera, pink conjunctiva, MMM, oropharynx clear Neck- Supple, no thyromegaly CVS- RRR, no murmur RESP-CTAB ABD-NABS,soft,NT,ND EXT- No edema Pulses- Radial, DP- 2+ Psych- depressed affect, not anxious appearing, no SI, well groomed, good eye contact       Assessment & Plan:      Problem List Items Addressed This Visit   Hypertension     Well controlled     Relevant Orders      Comprehensive metabolic panel      CBC with Differential   Hyperlipidemia - Primary (Chronic)     Check FLP    Relevant Orders      Comprehensive metabolic panel      CBC with Differential      Lipid panel      Note: This dictation was prepared with Dragon dictation along with smaller phrase technology. Any transcriptional errors that result from this process are unintentional.

## 2013-10-15 NOTE — Assessment & Plan Note (Signed)
She has declined seeing a psychiatrist and a therapist. I will start her on Abilify 5 mg as an adjunct to her is current Effexor. She will continue her benzo diazepam as well.

## 2013-10-15 NOTE — Patient Instructions (Signed)
Start Abilify 12m once a day Continue all other medications We will send a letter with labs if normal F/U 2 months for medications

## 2013-10-15 NOTE — Assessment & Plan Note (Signed)
Check FLP

## 2013-10-15 NOTE — Assessment & Plan Note (Signed)
We'll try to improve her mood I think this will help her sleep. For now she will continue the temazepam and Xanax at bedtime

## 2013-10-15 NOTE — Assessment & Plan Note (Signed)
Well controlled 

## 2013-10-16 ENCOUNTER — Telehealth: Payer: Self-pay | Admitting: *Deleted

## 2013-10-16 MED ORDER — BUSPIRONE HCL 7.5 MG PO TABS: 7.5000 mg | ORAL_TABLET | Freq: Two times a day (BID) | ORAL | Status: DC

## 2013-10-16 NOTE — Telephone Encounter (Signed)
MD please advise

## 2013-10-16 NOTE — Telephone Encounter (Signed)
Message copied by Sheral Flow on Wed Oct 16, 2013 11:07 AM ------      Message from: Lenore Manner      Created: Wed Oct 16, 2013 10:48 AM      Regarding: med issue      Contact: [phone]       Pt is calling back and she was given a new prescription for Abilify, she went to pharmacy to pick it up and it was almost $72 and with the side effects she is afraid to take it can you bump up her xanax. Can leave a message for the pt on her voicemail  ------

## 2013-10-16 NOTE — Telephone Encounter (Signed)
Send in Buspar 7.49m BID for anxiety/depression D/C the abilify since she can not afford I do not think increasing the xanax will help, try the buspar

## 2013-10-16 NOTE — Telephone Encounter (Signed)
Prescription sent to pharmacy.   Call placed to patient and patient made aware.

## 2013-10-17 ENCOUNTER — Other Ambulatory Visit: Payer: BC Managed Care – PPO | Admitting: Obstetrics and Gynecology

## 2013-10-22 ENCOUNTER — Other Ambulatory Visit: Payer: Self-pay | Admitting: *Deleted

## 2013-10-22 MED ORDER — FISH OIL 1000 MG PO CAPS: 1.0000 | ORAL_CAPSULE | Freq: Two times a day (BID) | ORAL | Status: DC

## 2013-10-31 NOTE — H&P (Signed)
  NTS SOAP Note  Vital Signs:  Vitals as of: 5/74/9355: Systolic 217: Diastolic 89: Heart Rate 95: Temp 97.46F: Height 22f 4in: Weight 161Lbs 0 Ounces: BMI 27.64  BMI : 27.64 kg/m2  Subjective: This 53Years old Female presents for of a small lump in her left thigh.  Has been present for awhile.  May have increased in size.  Tender to touch on occassion to pressure.  Review of Symptoms:  Constitutional:  fatigue,weakness    headache Eyes:unremarkable   sinus, ear, and throat infections     chronic chest pain Respiratory:unremarkable   Gastrointestin    abdominal pain,dyspepsia Genitourinary:unremarkable       joint, neck, and back pain dry skin Hematolgic/Lymphatic:unremarkable       hay fever   Past Medical History:    Reviewed  Past Medical History  Surgical History: hysterectomy, neck surgery Medical Problems: h/o CVA with some effect on left leg, GERD, HTN, back pain, insomnie, overweight, high cholesterol Psychiatric History:  Anxiety, Depression Allergies: morphine, phenergan   Social History:Reviewed  Social History  Preferred Language: English Race:  White Ethnicity: Not Hispanic / Latino Age: [age]Years 10 Months Marital Status:  S Alcohol: socially Recreational drug(s): no   Smoking Status: Unknown if ever smoked Functional Status reviewed on mm/dd/yyyy ------------------------------------------------ Bathing: Normal Cooking: Normal Dressing: Normal Driving: Normal Eating: Normal Managing Meds: Normal Oral Care: Normal Shopping: Normal Toileting: Normal Transferring: Normal Walking: Normal Cognitive Status reviewed on mm/dd/yyyy ------------------------------------------------ Attention: Normal Decision Making: Normal Language: Normal Memory: Normal Motor: Normal Perception: Normal Problem Solving: Normal Visual and Spatial: Normal   Family History:  Reviewed  Family Health History Mother,  Living; Breast cancer;  Father, Deceased; History Unknown    Objective Information: General:  Well appearing, well nourished in no distress.      Ovoid 1cm rubbery, subcutaneous nodule present in left lateral thigh.  Mobile Heart:  RRR, no murmur Lungs:    CTA bilaterally, no wheezes, rhonchi, rales.  Breathing unlabored.  Assessment:Mass, left thigh, most likely lipoma  Diagnoses: 239.2 Neoplasm of skin (Neoplasm of unspecified behavior of bone, soft tissue, and skin)  Procedures: 947159- OFFICE OUTPATIENT NEW 20 MINUTES    Plan: Scheduled for excision of soft tissue neoplasm, left thigh on 11/18/13.

## 2013-11-01 ENCOUNTER — Ambulatory Visit (INDEPENDENT_AMBULATORY_CARE_PROVIDER_SITE_OTHER): Payer: BC Managed Care – PPO | Admitting: Obstetrics and Gynecology

## 2013-11-01 ENCOUNTER — Encounter: Payer: Self-pay | Admitting: Obstetrics and Gynecology

## 2013-11-01 VITALS — BP 180/100 | Ht 63.75 in | Wt 159.0 lb

## 2013-11-01 DIAGNOSIS — I639 Cerebral infarction, unspecified: Secondary | ICD-10-CM | POA: Insufficient documentation

## 2013-11-01 DIAGNOSIS — Z0000 Encounter for general adult medical examination without abnormal findings: Secondary | ICD-10-CM

## 2013-11-01 DIAGNOSIS — N816 Rectocele: Secondary | ICD-10-CM

## 2013-11-01 DIAGNOSIS — Z1212 Encounter for screening for malignant neoplasm of rectum: Secondary | ICD-10-CM

## 2013-11-01 DIAGNOSIS — N952 Postmenopausal atrophic vaginitis: Secondary | ICD-10-CM

## 2013-11-01 DIAGNOSIS — Z01419 Encounter for gynecological examination (general) (routine) without abnormal findings: Secondary | ICD-10-CM

## 2013-11-01 LAB — HEMOCCULT GUIAC POC 1CARD (OFFICE): Fecal Occult Blood, POC: NEGATIVE

## 2013-11-01 NOTE — Progress Notes (Signed)
This chart was scribed by Jenne Campus, Medical Scribe, for Dr. Mallory Shirk on 11/01/13 at 11:37 AM. This chart was reviewed by Dr. Mallory Shirk and is accurate. Assessment:  Annual Gyn Exam Chronic back and neck pain from 40D breast size Atrophic vaginitis s/p hysterectomy S/p hemorrhagic CVA  Recurrent rectocele Plan:  1. pap smear not done, s/p hysterectomy 2. return annually or prn 3    Annual mammogram advised 4   Referred to Ortho for chronic back pain from large breast size 5   F/u with Noodleman, Neuro, for hormone therapy discussion--MD to send letter Subjective:  [name] is a 53 y.o. female No obstetric history on file. who presents for annual exam. No LMP recorded. Patient has had a hysterectomy. The patient has complaints today of rectal pain and rectal bleeding. H/O HTN, followed by Dr. [name], on hydrochlorothiazide   The following portions of the patient's history were reviewed and updated as appropriate: allergies, current medications, past family history, past medical history, past social history, past surgical history and problem list.  Review of Systems Constitutional: negative, history of hemorrhagic stroke.--had HA x10 days then developed left-sided paralysis and confusion prior to CVA dx, "leaking blood vessel", On sleeping aid, having severe anxiety, Dr. Durene Cal is Neurologist +having neck and back pain due to 40D breast size, has tried to get breast reduction approved by insurance and has been denied, has f/u with an orthopedist and chiropractor but reports having an issue f/u consistently due to monetary constraints  Gastrointestinal: h/o two prior rectocele repairs, having rectal pain and rectal bleeding, feels like stool gets "stuck", takes Murelax and other stool softeners  Genitourinary: h/o hysterectomy, dyspareunia  Skin: candidiasis infections under bilateral breasts, wants a breast reduction for 40 D breast size  Objective:  BP 180/100  Ht 5'  3.75" (1.619 m)  Wt 159 lb (72.122 kg)  BMI 27.52 kg/m2   BMI: Body mass index is 27.52 kg/(m^2).  Chaperone present for exam which was performed with pt's permission General Appearance: Alert, appropriate appearance for age. No acute distress HEENT: Grossly normal Neck / Thyroid:  Cardiovascular: RRR; normal S1, S2, no murmur Lungs: CTA bilaterally Back: No CVAT Breast Exam: No dimpling, nipple retraction or discharge. No masses or nodes., Normal to inspection, Normal breast tissue bilaterally and No masses or nodes.No dimpling, nipple retraction or discharge. Gastrointestinal: Soft, non-tender, no masses or organomegaly Pelvic Exam: External genitalia: normal general appearance Urinary system: urethral meatus normal Vaginal: atrophic mucosa Cervix: absent Adnexa: absent Uterus: absent Rectal: good anterior rectal support, tissue weakness with recurrent rectocele  Lymphatic Exam: Non-palpable nodes in neck, clavicular, axillary, or inguinal regions Skin: no rash or abnormalities Neurologic: Normal gait and speech, no tremor  Psychiatric: Alert and oriented, appropriate affect.  Urinalysis:Not done  Mallory Shirk. MD Pgr [phone] 11:37 AM

## 2013-11-05 ENCOUNTER — Encounter (HOSPITAL_COMMUNITY): Payer: Self-pay | Admitting: Pharmacy Technician

## 2013-11-13 NOTE — Patient Instructions (Addendum)
Addy Attaway  11/13/2013   Your procedure is scheduled on:   11/18/2013  Report to [name] at  6:15  AM.  Call this number if you have problems the morning of surgery: [phone]   Remember:   Do not eat food or drink liquids after midnight.   Take these medicines the morning of surgery with A SIP OF WATER:  Buspirone, Meloxicam, Effexor, Protonix and Xanax. You may take your Flexeril and Oxycodone if needed.   Do not wear jewelry, make-up or nail polish.  Do not wear lotions, powders, or perfumes.   Do not shave 48 hours prior to surgery. Men may shave face and neck.  Do not bring valuables to the hospital.  [facility] is not responsible for any belongings or valuables.               Contacts, dentures or bridgework may not be worn into surgery.  Leave suitcase in the car. After surgery it may be brought to your room.  For patients admitted to the hospital, discharge time is determined by your treatment team.               Patients discharged the day of surgery will not be allowed to drive home.   Special Instructions: Shower using CHG 1 nights before surgery and the morning of surgery.  Use special wash - you have one bottle of CHG for both showers.  You should use approximately 1/2 of the bottle for each shower.   Please read over the following fact sheets that you were given: Anesthesia Post-op Instructions and Care and Recovery After Surgery   Excision of Skin Lesions Excision of a skin lesion refers to the removal of a section of skin by making small cuts (incisions) in the skin. This is typically done to remove a cancerous growth (basal cell carcinoma, squamous cell carcinoma, or melanoma) or a noncancerous growth (cyst). It may be done to treat or prevent cancer or infection. It may also be done to improve cosmetic appearance (removal of mole, skin tag). LET YOUR CAREGIVER KNOW ABOUT:   Allergies to food or medicine.  Medicines taken, including vitamins, herbs,  eyedrops, over-the-counter medicines, and creams.  Use of steroids (by mouth or creams).  Previous problems with anesthetics or numbing medicines.  History of bleeding problems or blood clots.  History of any prostheses.  Previous surgery.  Other health problems, including diabetes and kidney problems.  Possibility of pregnancy, if this applies. RISKS AND COMPLICATIONS  Many complications can be managed. With appropriate treatment and rehabilitation, the following complications are very uncommon:  Bleeding.  Infection.  Scarring.  Recurrence of cyst or cancer.  Changes in skin sensation or appearance (discoloration, swelling).  Reaction to anesthesia.  Allergic reaction to surgical materials or ointments.  Damage to nerves, blood vessels, muscles, or other structures.  Continued pain. BEFORE THE PROCEDURE  It is important to follow your caregiver's instructions prior to your procedure to avoid complications. Steps before your procedure may include:  Physical exam, blood tests, other procedures, such as removing a small sample for examination under a microscope (biopsy).  Your caregiver may review the procedure, the anesthesia being used, and what to expect after the procedure with you. You may be asked to:  Stop taking certain medicines, such as blood thinners (including aspirin, clopidogrel, ibuprofen), for several days prior to your procedure.  Take certain medicines.  Stop smoking. It is a good idea to arrange for  a ride home after surgery and to have someone to help you with activities during recovery. PROCEDURE  There are several excision techniques. The type of excision or surgical technique used will depend on your condition, the location of the lesion, and your overall health. After the lesion is sterilized and a local anesthetic is applied, the following may be performed: Complete surgical excision The area to be removed is marked with a pen. Using a small  scalpel and scissors, the surgeon gently cuts around and under the lesion until it is completely removed. The lesion is placed in a special fluid and sent to the lab for examination. If necessary, bleeding will be controlled with a device that delivers heat. The edges of the wound are stitched together and a dressing is applied. This procedure may be performed to treat a cancerous growth or noncancerous cyst or lesion. Surgeons commonly perform an elliptical excision, to minimize scarring. Excision of a cyst The surgeon makes an incision on the cyst. The entire cyst is removed through the incision. The wound may be closed with a suture (stitch). Shave excision During shave excision, the surgeon uses a small blade or loop instrument to shave off the lesion. This may be done to remove a mole or skin tag. The wound is usually left to heal on its own without stitches. Punch excision During punch excision, the surgeon uses a small, round tool (like a cookie cutter) to cut a circle shape out of the skin. The outer edges of the skin are stitched together. This may be done to remove a mole or scar or to perform a biopsy of the lesion. Mohs micrographic surgery During Mohs micrographic surgery, layers of the lesion are removed with a scalpel or loop instrument and immediately examined under a microscope until all of the abnormal or cancerous tissue is removed. This procedure is minimally invasive and ensures the best cosmetic outcome, with removal of as little normal tissue as possible. Mohs is usually done to treat skin cancer, such as basal cell carcinoma or squamous cell carcinoma, particularly on the face and ears. Antibiotic ointment is applied to the surgical area after each of the procedures listed above, as necessary. AFTER THE PROCEDURE  How well you heal depends on many factors. Most patients heal quite well with proper techniques and self-care. Scarring will lessen over time. HOME CARE INSTRUCTIONS    Take medicines for pain as directed.  Keep the incision area clean, dry, and protected for at least 48 hours. Change dressings as directed.  For bleeding, apply gentle but firm pressure to the wound using a folded towel for 20 minutes. Call your caregiver if bleeding does not stop.  Avoid high-impact exercise and activities until the stitches are removed or the area heals.  Follow your caregiver's instructions to minimize scarring. Avoid sun exposure until the area has healed. Scarring should lessen over time.  Follow up with your caregiver as directed. Removal of stitches within 4 to 14 days may be necessary. Finding out the results of your test Not all test results are available during your visit. If your test results are not back during the visit, make an appointment with your caregiver to find out the results. Do not assume everything is normal if you have not heard from your caregiver or the medical facility. It is important for you to follow up on all of your test results. SEEK MEDICAL CARE IF:   You or your child has an oral temperature above 102  F (38.9 C).  You develop signs of infection (chills, feeling unwell).  You notice bleeding, pain, discharge, redness, or swelling at the incision site.  You notice skin irregularities or changes in sensation. MAKE SURE YOU:   Understand these instructions.  Will watch your condition.  Will get help right away if you are not doing well or get worse. FOR MORE INFORMATION  American Academy [employer]: www.AromatherapyParty.no American Academy of Dermatology: http://jones-macias.info/ Document Released: 09/14/2009 Document Revised: 09/12/2011 Document Reviewed: 09/14/2009 [facility] Patient Information 2014 [place].  PATIENT INSTRUCTIONS POST-ANESTHESIA  IMMEDIATELY FOLLOWING SURGERY:  Do not drive or operate machinery for the first twenty four hours after surgery.  Do not make any important decisions for twenty four hours after surgery  or while taking narcotic pain medications or sedatives.  If you develop intractable nausea and vomiting or a severe headache please notify your doctor immediately.  FOLLOW-UP:  Please make an appointment with your surgeon as instructed. You do not need to follow up with anesthesia unless specifically instructed to do so.  WOUND CARE INSTRUCTIONS (if applicable):  Keep a dry clean dressing on the anesthesia/puncture wound site if there is drainage.  Once the wound has quit draining you may leave it open to air.  Generally you should leave the bandage intact for twenty four hours unless there is drainage.  If the epidural site drains for more than 36-48 hours please call the anesthesia department.  QUESTIONS?:  Please feel free to call your physician or the hospital operator if you have any questions, and they will be happy to assist you.

## 2013-11-14 ENCOUNTER — Encounter (HOSPITAL_COMMUNITY)
Admission: RE | Admit: 2013-11-14 | Discharge: 2013-11-14 | Disposition: A | Discharge status: Home / Self Care | Payer: BC Managed Care – PPO | Source: Ambulatory Visit | Attending: General Surgery | Admitting: General Surgery

## 2013-11-14 ENCOUNTER — Other Ambulatory Visit: Payer: Self-pay

## 2013-11-14 ENCOUNTER — Encounter (HOSPITAL_COMMUNITY): Payer: Self-pay

## 2013-11-14 DIAGNOSIS — Z01818 Encounter for other preprocedural examination: Secondary | ICD-10-CM | POA: Insufficient documentation

## 2013-11-14 DIAGNOSIS — Z01810 Encounter for preprocedural cardiovascular examination: Secondary | ICD-10-CM | POA: Insufficient documentation

## 2013-11-14 MED ORDER — CHLORHEXIDINE GLUCONATE 4 % EX LIQD: 1.0000 "application " | Freq: Once | CUTANEOUS | Status: DC

## 2013-11-14 NOTE — Progress Notes (Signed)
11/14/13 0925  OBSTRUCTIVE SLEEP APNEA  Have you ever been diagnosed with sleep apnea through a sleep study? No  Do you snore loudly (loud enough to be heard through closed doors)?  1  Do you often feel tired, fatigued, or sleepy during the daytime? 1  Has anyone observed you stop breathing during your sleep? 0  Do you have, or are you being treated for high blood pressure? 1  BMI more than 35 kg/m2? 0  Age over 51 years old? 1  Neck circumference greater than 40 cm/16 inches? 0  Gender: 0  Obstructive Sleep Apnea Score 4

## 2013-11-18 ENCOUNTER — Encounter (HOSPITAL_COMMUNITY): Payer: Self-pay | Admitting: *Deleted

## 2013-11-18 ENCOUNTER — Encounter (HOSPITAL_COMMUNITY): Payer: BC Managed Care – PPO | Admitting: Anesthesiology

## 2013-11-18 ENCOUNTER — Encounter (HOSPITAL_COMMUNITY)
Admission: RE | Disposition: A | Discharge status: Home / Self Care | Payer: Self-pay | Source: Ambulatory Visit | Attending: General Surgery

## 2013-11-18 ENCOUNTER — Ambulatory Visit (HOSPITAL_COMMUNITY)
Admission: RE | Admit: 2013-11-18 | Discharge: 2013-11-18 | Disposition: A | Discharge status: Home / Self Care | Payer: BC Managed Care – PPO | Source: Ambulatory Visit | Attending: General Surgery | Admitting: General Surgery

## 2013-11-18 ENCOUNTER — Ambulatory Visit (HOSPITAL_COMMUNITY): Payer: BC Managed Care – PPO | Admitting: Anesthesiology

## 2013-11-18 DIAGNOSIS — K219 Gastro-esophageal reflux disease without esophagitis: Secondary | ICD-10-CM | POA: Insufficient documentation

## 2013-11-18 DIAGNOSIS — F329 Major depressive disorder, single episode, unspecified: Secondary | ICD-10-CM | POA: Insufficient documentation

## 2013-11-18 DIAGNOSIS — I10 Essential (primary) hypertension: Secondary | ICD-10-CM | POA: Insufficient documentation

## 2013-11-18 DIAGNOSIS — Z87891 Personal history of nicotine dependence: Secondary | ICD-10-CM | POA: Insufficient documentation

## 2013-11-18 DIAGNOSIS — F3289 Other specified depressive episodes: Secondary | ICD-10-CM | POA: Insufficient documentation

## 2013-11-18 DIAGNOSIS — D1739 Benign lipomatous neoplasm of skin and subcutaneous tissue of other sites: Secondary | ICD-10-CM | POA: Insufficient documentation

## 2013-11-18 DIAGNOSIS — F411 Generalized anxiety disorder: Secondary | ICD-10-CM | POA: Insufficient documentation

## 2013-11-18 DIAGNOSIS — Z8673 Personal history of transient ischemic attack (TIA), and cerebral infarction without residual deficits: Secondary | ICD-10-CM | POA: Insufficient documentation

## 2013-11-18 HISTORY — PX: LIPOMA EXCISION: SHX5283

## 2013-11-18 SURGERY — EXCISION LIPOMA
Anesthesia: General | Site: Thigh | Laterality: Left

## 2013-11-18 MED ORDER — KETOROLAC TROMETHAMINE 30 MG/ML IJ SOLN
INTRAMUSCULAR | Status: AC
  Filled 2013-11-18: qty 1

## 2013-11-18 MED ORDER — PROPOFOL 10 MG/ML IV BOLUS
INTRAVENOUS | Status: DC | PRN
  Administered 2013-11-18: 130 mg via INTRAVENOUS

## 2013-11-18 MED ORDER — FENTANYL CITRATE 0.05 MG/ML IJ SOLN
INTRAMUSCULAR | Status: AC
  Filled 2013-11-18: qty 2

## 2013-11-18 MED ORDER — GLYCOPYRROLATE 0.2 MG/ML IJ SOLN
0.2000 mg | Freq: Once | INTRAMUSCULAR | Status: AC
  Administered 2013-11-18: 0.2 mg via INTRAVENOUS

## 2013-11-18 MED ORDER — MIDAZOLAM HCL 2 MG/2ML IJ SOLN
1.0000 mg | INTRAMUSCULAR | Status: DC | PRN
  Administered 2013-11-18: 2 mg via INTRAVENOUS

## 2013-11-18 MED ORDER — FENTANYL CITRATE 0.05 MG/ML IJ SOLN
25.0000 ug | INTRAMUSCULAR | Status: AC
  Administered 2013-11-18 (×2): 25 ug via INTRAVENOUS

## 2013-11-18 MED ORDER — MIDAZOLAM HCL 2 MG/2ML IJ SOLN
INTRAMUSCULAR | Status: AC
  Filled 2013-11-18: qty 2

## 2013-11-18 MED ORDER — ONDANSETRON HCL 4 MG/2ML IJ SOLN
INTRAMUSCULAR | Status: AC
  Filled 2013-11-18: qty 2

## 2013-11-18 MED ORDER — FENTANYL CITRATE 0.05 MG/ML IJ SOLN
INTRAMUSCULAR | Status: DC | PRN
  Administered 2013-11-18: 25 ug via INTRAVENOUS

## 2013-11-18 MED ORDER — KETOROLAC TROMETHAMINE 30 MG/ML IJ SOLN
30.0000 mg | Freq: Once | INTRAMUSCULAR | Status: AC
  Administered 2013-11-18: 30 mg via INTRAVENOUS

## 2013-11-18 MED ORDER — LIDOCAINE HCL 1 % IJ SOLN
INTRAMUSCULAR | Status: DC | PRN
  Administered 2013-11-18: 30 mg via INTRADERMAL

## 2013-11-18 MED ORDER — PROPOFOL 10 MG/ML IV EMUL
INTRAVENOUS | Status: AC
  Filled 2013-11-18: qty 20

## 2013-11-18 MED ORDER — ONDANSETRON HCL 4 MG/2ML IJ SOLN: 4.0000 mg | Freq: Once | INTRAMUSCULAR | Status: DC | PRN

## 2013-11-18 MED ORDER — LACTATED RINGERS IV SOLN
INTRAVENOUS | Status: DC
  Administered 2013-11-18: 07:00:00 via INTRAVENOUS

## 2013-11-18 MED ORDER — LIDOCAINE HCL (PF) 1 % IJ SOLN
INTRAMUSCULAR | Status: AC
  Filled 2013-11-18: qty 5

## 2013-11-18 MED ORDER — OXYCODONE-ACETAMINOPHEN 5-325 MG PO TABS: 1.0000 | ORAL_TABLET | Freq: Four times a day (QID) | ORAL | Status: DC | PRN

## 2013-11-18 MED ORDER — ONDANSETRON HCL 4 MG/2ML IJ SOLN
4.0000 mg | Freq: Once | INTRAMUSCULAR | Status: AC
  Administered 2013-11-18: 4 mg via INTRAVENOUS

## 2013-11-18 MED ORDER — BUPIVACAINE HCL (PF) 0.5 % IJ SOLN
INTRAMUSCULAR | Status: DC | PRN
  Administered 2013-11-18: 6 mL

## 2013-11-18 MED ORDER — BUPIVACAINE HCL (PF) 0.5 % IJ SOLN
INTRAMUSCULAR | Status: AC
  Filled 2013-11-18: qty 30

## 2013-11-18 MED ORDER — 0.9 % SODIUM CHLORIDE (POUR BTL) OPTIME
TOPICAL | Status: DC | PRN
  Administered 2013-11-18: 500 mL

## 2013-11-18 MED ORDER — FENTANYL CITRATE 0.05 MG/ML IJ SOLN
25.0000 ug | INTRAMUSCULAR | Status: DC | PRN
  Administered 2013-11-18: 50 ug via INTRAVENOUS

## 2013-11-18 MED ORDER — GLYCOPYRROLATE 0.2 MG/ML IJ SOLN
INTRAMUSCULAR | Status: AC
  Filled 2013-11-18: qty 1

## 2013-11-18 SURGICAL SUPPLY — 33 items
ADH SKN CLS APL DERMABOND .7 (GAUZE/BANDAGES/DRESSINGS) ×1
BAG HAMPER (MISCELLANEOUS) ×3 IMPLANT
CLOTH BEACON ORANGE TIMEOUT ST (SAFETY) ×3 IMPLANT
COVER LIGHT HANDLE STERIS (MISCELLANEOUS) ×6 IMPLANT
DECANTER SPIKE VIAL GLASS SM (MISCELLANEOUS) ×3 IMPLANT
DERMABOND ADVANCED (GAUZE/BANDAGES/DRESSINGS) ×2
DERMABOND ADVANCED .7 DNX12 (GAUZE/BANDAGES/DRESSINGS) IMPLANT
DURAPREP 26ML APPLICATOR (WOUND CARE) ×3 IMPLANT
ELECT NDL TIP 2.8 STRL (NEEDLE) IMPLANT
ELECT NEEDLE TIP 2.8 STRL (NEEDLE) IMPLANT
ELECT REM PT RETURN 9FT ADLT (ELECTROSURGICAL) ×3
ELECTRODE REM PT RTRN 9FT ADLT (ELECTROSURGICAL) ×1 IMPLANT
FORMALIN 10 PREFIL 120ML (MISCELLANEOUS) ×1 IMPLANT
GLOVE INDICATOR 7.0 STRL GRN (GLOVE) ×2 IMPLANT
GLOVE SS BIOGEL STRL SZ 6.5 (GLOVE) IMPLANT
GLOVE SUPERSENSE BIOGEL SZ 6.5 (GLOVE) ×2
GLOVE SURG SS PI 7.5 STRL IVOR (GLOVE) ×6 IMPLANT
GOWN STRL REUS W/TWL LRG LVL3 (GOWN DISPOSABLE) ×7 IMPLANT
KIT ROOM TURNOVER APOR (KITS) ×3 IMPLANT
MANIFOLD NEPTUNE II (INSTRUMENTS) ×3 IMPLANT
NDL HYPO 25X1 1.5 SAFETY (NEEDLE) ×1 IMPLANT
NEEDLE HYPO 25X1 1.5 SAFETY (NEEDLE) ×3 IMPLANT
NS IRRIG 1000ML POUR BTL (IV SOLUTION) ×3 IMPLANT
PACK MINOR (CUSTOM PROCEDURE TRAY) IMPLANT
PAD ARMBOARD 7.5X6 YLW CONV (MISCELLANEOUS) ×3 IMPLANT
SET BASIN LINEN APH (SET/KITS/TRAYS/PACK) ×3 IMPLANT
SUT ETHILON 3 0 FSL (SUTURE) IMPLANT
SUT PROLENE 4 0 PS 2 18 (SUTURE) IMPLANT
SUT VIC AB 3-0 SH 27 (SUTURE) ×3
SUT VIC AB 3-0 SH 27X BRD (SUTURE) IMPLANT
SUT VIC AB 4-0 PS2 27 (SUTURE) ×2 IMPLANT
SYR CONTROL 10ML LL (SYRINGE) ×3 IMPLANT
TOWEL OR 17X26 4PK STRL BLUE (TOWEL DISPOSABLE) ×1 IMPLANT

## 2013-11-18 NOTE — Anesthesia Postprocedure Evaluation (Signed)
  Anesthesia Post-op Note  Patient: [name]  Procedure(s) Performed: Procedure(s): EXCISION OF SOFT TISSUE MASS-LEFT THIGH (Left)  Patient Location: PACU  Anesthesia Type:General  Level of Consciousness: awake, alert  and oriented  Airway and Oxygen Therapy: Patient Spontanous Breathing and Patient connected to face mask oxygen  Post-op Pain: none  Post-op Assessment: Post-op Vital signs reviewed, Patient's Cardiovascular Status Stable, Respiratory Function Stable, Patent Airway and No signs of Nausea or vomiting  Post-op Vital Signs: Reviewed and stable  Last Vitals:  Filed Vitals:   11/18/13 0642  BP: 134/84  Pulse: 69  Temp: 36.4 C  Resp: 21    Complications: No apparent anesthesia complications

## 2013-11-18 NOTE — Discharge Instructions (Signed)
Lipoma A lipoma is a noncancerous (benign) tumor composed of fat cells. They are usually found under the skin (subcutaneous). A lipoma may occur in any tissue of the body that contains fat. Common areas for lipomas to appear include the back, shoulders, buttocks, and thighs. Lipomas are a very common soft tissue growth. They are soft and grow slowly. Most problems caused by a lipoma depend on where it is growing. DIAGNOSIS  A lipoma can be diagnosed with a physical exam. These tumors rarely become cancerous, but radiographic studies can help determine this for certain. Studies used may include:  Computerized X-ray scans (CT or CAT scan).  Computerized magnetic scans (MRI). TREATMENT  Small lipomas that are not causing problems may be watched. If a lipoma continues to enlarge or causes problems, removal is often the best treatment. Lipomas can also be removed to improve appearance. Surgery is done to remove the fatty cells and the surrounding capsule. Most often, this is done with medicine that numbs the area (local anesthetic). The removed tissue is examined under a microscope to make sure it is not cancerous. Keep all follow-up appointments with your caregiver. SEEK MEDICAL CARE IF:   The lipoma becomes larger or hard.  The lipoma becomes painful, red, or increasingly swollen. These could be signs of infection or a more serious condition. Document Released: 06/10/2002 Document Revised: 09/12/2011 Document Reviewed: 11/20/2009 [facility] Patient Information 2014 [place], [place].

## 2013-11-18 NOTE — Op Note (Signed)
Patient:  [name]  DOB:  October 29, 1960  MRN:  [id number]   Preop Diagnosis:  Subcutaneous neoplasm, left thigh  Postop Diagnosis:  Same  Procedure:  Excision of 1.5 cm subcutaneous neoplasm, left thigh  Surgeon:  Aviva Signs, M.D.  Anes:  General  Indications:  Patient is a 53 year old white female who presents with a tender subcutaneous nodule in the lateral aspect of left thigh. The risks and benefits of the procedure including bleeding, infection, and recurrence of the neoplasm were fully explained to the patient, who gave informed consent.  Procedure note:  The patient is placed the supine position. After general anesthesia was administered, the left lateral thigh was prepped and draped using usual sterile technique with DuraPrep. Surgical site confirmation was performed.  A longitudinal incision was made over the mass. The dissection was taken down to the subcutaneous tissue. The suspicious mass was identified and excised. It was well contained. It was sent to pathology for further examination. A bleeding was controlled using Bovie electrocautery. The subcutaneous layer was reapproximated using 3-0 Vicryl interrupted suture. The skin was closed using a 4-0 Vicryl subcuticular suture. Dermabond was then applied.  All tape and needle counts were correct at the end of the procedure. Patient was awakened and transferred to PACU in stable condition.  Complications:  None  EBL:  Minimal  Specimen:  Subcutaneous mass, left thigh

## 2013-11-18 NOTE — Interval H&P Note (Signed)
History and Physical Interval Note:  11/18/2013 7:20 AM  [name]  has presented today for surgery, with the diagnosis of skin neoplasm left thigh  The various methods of treatment have been discussed with the patient and family. After consideration of risks, benefits and other options for treatment, the patient has consented to  Procedure(s): EXCISION SKIN NEOPLASM LEFT THIGH (Left) as a surgical intervention .  The patient's history has been reviewed, patient examined, no change in status, stable for surgery.  I have reviewed the patient's chart and labs.  Questions were answered to the patient's satisfaction.     Jamesetta So

## 2013-11-18 NOTE — Anesthesia Preprocedure Evaluation (Addendum)
Anesthesia Evaluation  Patient identified by MRN, date of birth, ID band Patient awake    Reviewed: Allergy & Precautions, H&P , NPO status , Patient's Chart, lab work & pertinent test results  Airway Mallampati: II TM Distance: >3 FB     Dental  (+) Teeth Intact   Pulmonary former smoker,  breath sounds clear to auscultation        Cardiovascular hypertension, Pt. on medications Rhythm:Regular Rate:Normal     Neuro/Psych PSYCHIATRIC DISORDERS Anxiety Depression CVA, No Residual Symptoms    GI/Hepatic GERD-  Medicated and Controlled,  Endo/Other    Renal/GU      Musculoskeletal   Abdominal   Peds  Hematology   Anesthesia Other Findings   Reproductive/Obstetrics                          Anesthesia Physical Anesthesia Plan  ASA: III  Anesthesia Plan: General   Post-op Pain Management:    Induction: Intravenous  Airway Management Planned: LMA  Additional Equipment:   Intra-op Plan:   Post-operative Plan: Extubation in OR  Informed Consent: I have reviewed the patients History and Physical, chart, labs and discussed the procedure including the risks, benefits and alternatives for the proposed anesthesia with the patient or authorized representative who has indicated his/her understanding and acceptance.     Plan Discussed with:   Anesthesia Plan Comments:         Anesthesia Quick Evaluation

## 2013-11-18 NOTE — Transfer of Care (Signed)
Immediate Anesthesia Transfer of Care Note  Patient: [name]  Procedure(s) Performed: Procedure(s): EXCISION OF SOFT TISSUE MASS-LEFT THIGH (Left)  Patient Location: PACU  Anesthesia Type:General  Level of Consciousness: awake, alert  and oriented  Airway & Oxygen Therapy: Patient Spontanous Breathing and Patient connected to face mask oxygen  Post-op Assessment: Report given to PACU RN  Post vital signs: Reviewed and stable  Complications: No apparent anesthesia complications

## 2013-11-18 NOTE — Anesthesia Procedure Notes (Signed)
Procedure Name: LMA Insertion Date/Time: 11/18/2013 7:39 AM Performed by: Tressie Stalker E Pre-anesthesia Checklist: Patient identified, Patient being monitored, Emergency Drugs available, Timeout performed and Suction available Patient Re-evaluated:Patient Re-evaluated prior to inductionOxygen Delivery Method: Circle System Utilized Preoxygenation: Pre-oxygenation with 100% oxygen Intubation Type: IV induction Ventilation: Mask ventilation without difficulty LMA: LMA inserted LMA Size: 3.0 Number of attempts: 1 Placement Confirmation: positive ETCO2 and breath sounds checked- equal and bilateral

## 2013-11-19 ENCOUNTER — Encounter (HOSPITAL_COMMUNITY): Payer: Self-pay | Admitting: General Surgery

## 2013-12-04 ENCOUNTER — Telehealth: Payer: Self-pay | Admitting: Obstetrics and Gynecology

## 2013-12-11 ENCOUNTER — Other Ambulatory Visit: Payer: Self-pay | Admitting: Family Medicine

## 2013-12-11 NOTE — Telephone Encounter (Signed)
Refill appropriate and filled per protocol. 

## 2013-12-16 ENCOUNTER — Ambulatory Visit (INDEPENDENT_AMBULATORY_CARE_PROVIDER_SITE_OTHER): Payer: BC Managed Care – PPO | Admitting: Family Medicine

## 2013-12-16 ENCOUNTER — Encounter: Payer: Self-pay | Admitting: Family Medicine

## 2013-12-16 ENCOUNTER — Telehealth: Payer: Self-pay | Admitting: *Deleted

## 2013-12-16 VITALS — BP 148/82 | HR 78 | Temp 97.8°F | Resp 16 | Ht 63.0 in | Wt 153.0 lb

## 2013-12-16 DIAGNOSIS — R7989 Other specified abnormal findings of blood chemistry: Secondary | ICD-10-CM

## 2013-12-16 DIAGNOSIS — F32A Depression, unspecified: Secondary | ICD-10-CM

## 2013-12-16 DIAGNOSIS — F3289 Other specified depressive episodes: Secondary | ICD-10-CM

## 2013-12-16 DIAGNOSIS — F329 Major depressive disorder, single episode, unspecified: Secondary | ICD-10-CM

## 2013-12-16 DIAGNOSIS — R945 Abnormal results of liver function studies: Secondary | ICD-10-CM

## 2013-12-16 DIAGNOSIS — G4700 Insomnia, unspecified: Secondary | ICD-10-CM

## 2013-12-16 DIAGNOSIS — F419 Anxiety disorder, unspecified: Secondary | ICD-10-CM

## 2013-12-16 DIAGNOSIS — R7401 Elevation of levels of liver transaminase levels: Secondary | ICD-10-CM | POA: Insufficient documentation

## 2013-12-16 DIAGNOSIS — F411 Generalized anxiety disorder: Secondary | ICD-10-CM

## 2013-12-16 DIAGNOSIS — I10 Essential (primary) hypertension: Secondary | ICD-10-CM

## 2013-12-16 HISTORY — DX: Other specified abnormal findings of blood chemistry: R79.89

## 2013-12-16 LAB — COMPREHENSIVE METABOLIC PANEL
ALT: 34 U/L (ref 0–35)
AST: 23 U/L (ref 0–37)
Albumin: 4.8 g/dL (ref 3.5–5.2)
Alkaline Phosphatase: 59 U/L (ref 39–117)
BUN: 15 mg/dL (ref 6–23)
CO2: 28 mEq/L (ref 19–32)
Calcium: 10.2 mg/dL (ref 8.4–10.5)
Chloride: 103 mEq/L (ref 96–112)
Creat: 1.07 mg/dL (ref 0.50–1.10)
Glucose, Bld: 105 mg/dL — ABNORMAL HIGH (ref 70–99)
Potassium: 4.4 mEq/L (ref 3.5–5.3)
Sodium: 140 mEq/L (ref 135–145)
Total Bilirubin: 0.8 mg/dL (ref 0.2–1.2)
Total Protein: 7.2 g/dL (ref 6.0–8.3)

## 2013-12-16 LAB — CBC WITH DIFFERENTIAL/PLATELET
Basophils Absolute: 0 10*3/uL (ref 0.0–0.1)
Basophils Relative: 0 % (ref 0–1)
Eosinophils Absolute: 0.1 10*3/uL (ref 0.0–0.7)
Eosinophils Relative: 3 % (ref 0–5)
HCT: 41.2 % (ref 36.0–46.0)
Hemoglobin: 14.4 g/dL (ref 12.0–15.0)
Lymphocytes Relative: 46 % (ref 12–46)
Lymphs Abs: 2.1 10*3/uL (ref 0.7–4.0)
MCH: 31.6 pg (ref 26.0–34.0)
MCHC: 35 g/dL (ref 30.0–36.0)
MCV: 90.4 fL (ref 78.0–100.0)
Monocytes Absolute: 0.3 10*3/uL (ref 0.1–1.0)
Monocytes Relative: 7 % (ref 3–12)
Neutro Abs: 2 10*3/uL (ref 1.7–7.7)
Neutrophils Relative %: 44 % (ref 43–77)
Platelets: 320 10*3/uL (ref 150–400)
RBC: 4.56 MIL/uL (ref 3.87–5.11)
RDW: 13.3 % (ref 11.5–15.5)
WBC: 4.6 10*3/uL (ref 4.0–10.5)

## 2013-12-16 MED ORDER — TRAZODONE HCL 50 MG PO TABS: 100.0000 mg | ORAL_TABLET | Freq: Every day | ORAL | Status: DC

## 2013-12-16 MED ORDER — HYDROCHLOROTHIAZIDE 25 MG PO TABS: 25.0000 mg | ORAL_TABLET | Freq: Every day | ORAL | Status: DC

## 2013-12-16 MED ORDER — ALPRAZOLAM 1 MG PO TABS: 1.0000 mg | ORAL_TABLET | Freq: Two times a day (BID) | ORAL | Status: DC | PRN

## 2013-12-16 NOTE — Patient Instructions (Addendum)
Stop the buspar by taking 1 tablet daily x 5 days, then stop Stop the restoril  Start Trazodone at bedtime start with 38m then increase to 2 tablets after 1 week For blood pressure   Xanax increased to 120mto twice a day Increase the HCTZ to 2 tablets once a day  We will call with lab results F/U 2 MONTHS

## 2013-12-16 NOTE — Telephone Encounter (Signed)
I discussed with pt

## 2013-12-16 NOTE — Progress Notes (Signed)
Patient ID: [name], female   DOB: [birth year], 53 y.o.   MRN: [id number]   Subjective:    Patient ID: [name], female    DOB: 1960-10-13, 53 y.o.   MRN: [id number]  Patient presents for 2 month F/U, Medication Review and Elevated BP  patient here to followup medications. She states her anxiety is still bad and that the BuSpar does not help and she cannot tolerate. [name] also having worsening insomnia even though some night she's taking upwards of 60 mg of temazepam. She thinks that the trazodone actually work better for her even though she's been on that as well as doxepin Ambien and temazepam. Denies any suicidal ideations no hallucinations. She has some support from her church group still feels like no one understands her depression and anxiety. She thinks that all statins for which she had her stroke she's never been the same since then.  Hypertension blood pressure has been elevated at home as the 410-301 systolic over 31-43. She is taking hydrochlorothiazide 12.5 mg once a day     Review Of Systems:  GEN- denies fatigue, fever, weight loss,weakness, recent illness HEENT- denies eye drainage, change in vision, nasal discharge, CVS- denies chest pain, palpitations RESP- denies SOB, cough, wheeze ABD- denies N/V, change in stools, abd pain GU- denies dysuria, hematuria, dribbling, incontinence MSK- denies joint pain, muscle aches, injury Neuro- denies headache, dizziness, syncope, seizure activity       Objective:    BP 148/82  Pulse 78  Temp(Src) 97.8 F (36.6 C) (Oral)  Resp 16  Ht 5' 3"  (1.6 m)  Wt 153 lb (69.4 kg)  BMI 27.11 kg/m2 GEN- NAD, alert and oriented x3 CVS- RRR, no murmur RESP-CTAB EXT- No edema Pulses- Radial, DP- 2+ Psych- depressed affect, not anxious, normal speech, no SI        Assessment & Plan:      Problem List Items Addressed This Visit   Insomnia     Discontinue the temazepam    Hypertension     Increase hydrochlorothiazide to  25 mg    Relevant Medications      hydrochlorothiazide tablet   Other Relevant Orders      Comprehensive metabolic panel (Completed)      CBC with Differential (Completed)   Elevated LFTs     Recheck her LFTs    Depression - Primary     Per above uncontrolled trazodone will be added back    Relevant Medications      traZODone (DESYREL) tablet      ALPRAZolam (XANAX) tablet   Anxiety (Chronic)     She is uncontrolled depression and anxiety which is been long-standing. She declines going to the psychiatrist though I think she would benefit from it because of all the different medication changes and what she can and cannot tolerate. I will increase her Xanax to 1 mg 3 times a day. We will discontinue the BuSpar as this was not helping. I will add back trazodone for her sleep    Relevant Medications      traZODone (DESYREL) tablet      ALPRAZolam Duanne Moron) tablet      Note: This dictation was prepared with Dragon dictation along with smaller phrase technology. Any transcriptional errors that result from this process are unintentional.

## 2013-12-16 NOTE — Telephone Encounter (Signed)
Patient in office and requested MD to advise on psychiatrist for her.   She is going to check with her insurance to find out how much they will cover.   MD please advise.

## 2013-12-17 NOTE — Assessment & Plan Note (Signed)
Per above uncontrolled trazodone will be added back

## 2013-12-17 NOTE — Assessment & Plan Note (Signed)
Recheck her LFTs

## 2013-12-17 NOTE — Assessment & Plan Note (Signed)
Discontinue the temazepam

## 2013-12-17 NOTE — Assessment & Plan Note (Signed)
She is uncontrolled depression and anxiety which is been long-standing. She declines going to the psychiatrist though I think she would benefit from it because of all the different medication changes and what she can and cannot tolerate. I will increase her Xanax to 1 mg 3 times a day. We will discontinue the BuSpar as this was not helping. I will add back trazodone for her sleep

## 2013-12-17 NOTE — Assessment & Plan Note (Signed)
Increase hydrochlorothiazide to 25 mg

## 2013-12-23 ENCOUNTER — Telehealth: Payer: Self-pay | Admitting: *Deleted

## 2013-12-23 MED ORDER — PANTOPRAZOLE SODIUM 40 MG PO TBEC: 40.0000 mg | DELAYED_RELEASE_TABLET | Freq: Every day | ORAL | Status: DC

## 2013-12-23 NOTE — Telephone Encounter (Signed)
Prescription sent to pharmacy.   Call placed to patient and patient made aware per VM.

## 2013-12-23 NOTE — Telephone Encounter (Signed)
Message copied by Sheral Flow on Mon Dec 23, 2013  2:37 PM ------      Message from: Devoria Glassing      Created: Mon Dec 23, 2013  1:55 PM       Patient is calling to ask if we will sent [facility] a request to for her protonix  If possible       Please call patient back with questions [phone] ------

## 2014-01-21 ENCOUNTER — Other Ambulatory Visit: Payer: Self-pay | Admitting: *Deleted

## 2014-01-21 MED ORDER — MELOXICAM 15 MG PO TABS: ORAL_TABLET | ORAL | Status: DC

## 2014-01-21 NOTE — Telephone Encounter (Signed)
Refill appropriate and filled per protocol. 

## 2014-02-17 ENCOUNTER — Other Ambulatory Visit: Payer: Self-pay | Admitting: Family Medicine

## 2014-02-17 ENCOUNTER — Encounter: Payer: Self-pay | Admitting: Family Medicine

## 2014-02-17 ENCOUNTER — Ambulatory Visit (INDEPENDENT_AMBULATORY_CARE_PROVIDER_SITE_OTHER): Payer: BC Managed Care – PPO | Admitting: Family Medicine

## 2014-02-17 VITALS — BP 140/78 | HR 78 | Temp 98.5°F | Resp 14 | Ht 63.0 in | Wt 147.0 lb

## 2014-02-17 DIAGNOSIS — M5442 Lumbago with sciatica, left side: Secondary | ICD-10-CM

## 2014-02-17 DIAGNOSIS — F3289 Other specified depressive episodes: Secondary | ICD-10-CM

## 2014-02-17 DIAGNOSIS — E785 Hyperlipidemia, unspecified: Secondary | ICD-10-CM

## 2014-02-17 DIAGNOSIS — F32A Depression, unspecified: Secondary | ICD-10-CM

## 2014-02-17 DIAGNOSIS — G4700 Insomnia, unspecified: Secondary | ICD-10-CM

## 2014-02-17 DIAGNOSIS — M5136 Other intervertebral disc degeneration, lumbar region: Secondary | ICD-10-CM

## 2014-02-17 DIAGNOSIS — M5430 Sciatica, unspecified side: Secondary | ICD-10-CM

## 2014-02-17 DIAGNOSIS — F419 Anxiety disorder, unspecified: Secondary | ICD-10-CM

## 2014-02-17 DIAGNOSIS — B880 Other acariasis: Secondary | ICD-10-CM

## 2014-02-17 DIAGNOSIS — F329 Major depressive disorder, single episode, unspecified: Secondary | ICD-10-CM

## 2014-02-17 DIAGNOSIS — M5137 Other intervertebral disc degeneration, lumbosacral region: Secondary | ICD-10-CM

## 2014-02-17 DIAGNOSIS — F411 Generalized anxiety disorder: Secondary | ICD-10-CM

## 2014-02-17 DIAGNOSIS — I10 Essential (primary) hypertension: Secondary | ICD-10-CM

## 2014-02-17 LAB — COMPREHENSIVE METABOLIC PANEL
ALT: 26 U/L (ref 0–35)
AST: 21 U/L (ref 0–37)
Albumin: 4.9 g/dL (ref 3.5–5.2)
Alkaline Phosphatase: 57 U/L (ref 39–117)
BUN: 16 mg/dL (ref 6–23)
CO2: 30 mEq/L (ref 19–32)
Calcium: 10 mg/dL (ref 8.4–10.5)
Chloride: 100 mEq/L (ref 96–112)
Creat: 1.04 mg/dL (ref 0.50–1.10)
Glucose, Bld: 110 mg/dL — ABNORMAL HIGH (ref 70–99)
Potassium: 3.7 mEq/L (ref 3.5–5.3)
Sodium: 141 mEq/L (ref 135–145)
Total Bilirubin: 1.1 mg/dL (ref 0.2–1.2)
Total Protein: 7.2 g/dL (ref 6.0–8.3)

## 2014-02-17 LAB — LIPID PANEL
Cholesterol: 192 mg/dL (ref 0–200)
HDL: 59 mg/dL (ref >39)
LDL Cholesterol: 105 mg/dL — ABNORMAL HIGH (ref 0–99)
Total CHOL/HDL Ratio: 3.3 Ratio
Triglycerides: 138 mg/dL (ref <150)
VLDL: 28 mg/dL (ref 0–40)

## 2014-02-17 NOTE — Progress Notes (Signed)
Patient ID: [name], female   DOB: 05/12/1961, 53 y.o.   MRN: [id number]   Subjective:    Patient ID: [name], female    DOB: Jul 14, 1960, 53 y.o.   MRN: [id number]  Patient presents for 2 month F/U  patient here for interim followup. Of note she did send me a letter in the mail discussing how her depression was getting worse measures try to pop disability but she was unable to work do to her depression as well as weakness of her left arm which is residual from her stroke. She agrees today that she needs to be seen by a psychiatrist and agrees to go see one for medication management and therapy type in asking her to do for quite some time. She is taking her medications as prescribed.  Back pain she has chronic back pain she's been seen by note when neurosurgery in the past and they did not find anything particularly wrong on MRI few years ago with exception of mild arthritis. [name] been having worsening sciatica symptoms down the left side which leads to weakness and she feels like she's going to fall. [name] been taking pain medication on and off for her back pain.  Constipation she is doing fairly well with her MiraLAX she needs a refill of this.  She was out in the yard and stepped in a polyp sugars she has some bites on her lower extremities which she's been using topical anti-itch cream for  She also inquired about whether or not she to use topical hormone secondary to her rectocele she states that GYN would not prescribe it because her neurosurgeon told her after her stroke but she did not have any hormone therapy she's not sure she can have topical therapy. Unfortunately do not see any records of this conversation.   Review Of Systems:  GEN- denies fatigue, fever, weight loss,weakness, recent illness HEENT- denies eye drainage, change in vision, nasal discharge, CVS- denies chest pain, palpitations RESP- denies SOB, cough, wheeze ABD- denies N/V, change in stools, abd pain GU-  denies dysuria, hematuria, dribbling, incontinence MSK- denies joint pain, muscle aches, injury Neuro- denies headache, dizziness, syncope, seizure activity       Objective:    BP 140/78  Pulse 78  Temp(Src) 98.5 F (36.9 C) (Oral)  Resp 14  Ht 5' 3"  (1.6 m)  Wt 147 lb (66.679 kg)  BMI 26.05 kg/m2 GEN- NAD, alert and oriented x3 HEENT- PERRL, EOMI, non injected sclera, pink conjunctiva, MMM, oropharynx clear Neck- Supple, no thyromegaly CVS- RRR, no murmur RESP-CTAB ABD-NABS,soft,NT,ND MSK-TTP lumbar spine, +SLR left side, fair ROM hips,  NEURO- decreased ROM Lumbar spine, DTR symmetric, normal tone,sensation in tact,strength mildly decreased left LE compared to right EXT- No edema Skin- few scattered erythematous maculopapular lesions on legs,no excoriations Pulses- Radial, DP- 2+        Assessment & Plan:      Problem List Items Addressed This Visit   Insomnia   Hypertension   Relevant Orders      Comprehensive metabolic panel   Hyperlipidemia (Chronic)   Relevant Orders      Lipid panel   Depression - Primary   Back pain   Anxiety (Chronic)      Note: This dictation was prepared with Dragon dictation along with smaller phrase technology. Any transcriptional errors that result from this process are unintentional.

## 2014-02-17 NOTE — Patient Instructions (Addendum)
Release of records- Dr. Benn Moulder - [facility]-- ALL RECORDS  MRI of back  We will call with labs Referral to psychiatry F/U 3 months

## 2014-02-18 DIAGNOSIS — M5431 Sciatica, right side: Secondary | ICD-10-CM | POA: Insufficient documentation

## 2014-02-18 DIAGNOSIS — M5136 Other intervertebral disc degeneration, lumbar region: Secondary | ICD-10-CM | POA: Insufficient documentation

## 2014-02-18 DIAGNOSIS — B880 Other acariasis: Secondary | ICD-10-CM | POA: Insufficient documentation

## 2014-02-18 MED ORDER — POLYETHYLENE GLYCOL 3350 17 GM/SCOOP PO POWD: 17.0000 g | Freq: Every day | ORAL | Status: DC

## 2014-02-18 MED ORDER — SIMVASTATIN 20 MG PO TABS: 20.0000 mg | ORAL_TABLET | Freq: Every day | ORAL | Status: DC

## 2014-02-18 NOTE — Assessment & Plan Note (Signed)
Improving with home treatment she can add hydrocortisone

## 2014-02-18 NOTE — Assessment & Plan Note (Signed)
Uncontrolled and she's been on a host of different medications. She is in agreement to psychiatry which she needs

## 2014-02-18 NOTE — Assessment & Plan Note (Signed)
Will obtain MRI of the L-spine. She will continue on her meloxicam and Flexeril she also has pain medicine as needed. Also obtain the labs records from the neurosurgeon

## 2014-02-19 LAB — HEMOGLOBIN A1C
Hgb A1c MFr Bld: 5.7 % — ABNORMAL HIGH (ref <5.7)
Mean Plasma Glucose: 117 mg/dL — ABNORMAL HIGH (ref <117)

## 2014-02-24 ENCOUNTER — Telehealth: Payer: Self-pay | Admitting: Family Medicine

## 2014-02-24 NOTE — Telephone Encounter (Signed)
Call placed to patient. LMTRC.  

## 2014-02-24 NOTE — Telephone Encounter (Signed)
Patient returned call and made aware.

## 2014-02-24 NOTE — Telephone Encounter (Signed)
Patient is calling to get results  [phone]

## 2014-03-03 ENCOUNTER — Ambulatory Visit (HOSPITAL_COMMUNITY)
Admission: RE | Admit: 2014-03-03 | Discharge: 2014-03-03 | Disposition: A | Discharge status: Home / Self Care | Payer: BC Managed Care – PPO | Source: Ambulatory Visit | Attending: Family Medicine | Admitting: Family Medicine

## 2014-03-03 DIAGNOSIS — M5136 Other intervertebral disc degeneration, lumbar region: Secondary | ICD-10-CM

## 2014-03-03 DIAGNOSIS — M5442 Lumbago with sciatica, left side: Secondary | ICD-10-CM

## 2014-03-07 ENCOUNTER — Ambulatory Visit (HOSPITAL_COMMUNITY)
Admission: RE | Admit: 2014-03-07 | Discharge: 2014-03-07 | Disposition: A | Discharge status: Home / Self Care | Payer: BC Managed Care – PPO | Source: Ambulatory Visit | Attending: Family Medicine | Admitting: Family Medicine

## 2014-03-07 ENCOUNTER — Encounter: Payer: Self-pay | Admitting: Family Medicine

## 2014-03-07 ENCOUNTER — Encounter (HOSPITAL_COMMUNITY): Payer: Self-pay

## 2014-03-07 DIAGNOSIS — R29898 Other symptoms and signs involving the musculoskeletal system: Secondary | ICD-10-CM | POA: Insufficient documentation

## 2014-03-07 DIAGNOSIS — M4120 Other idiopathic scoliosis, site unspecified: Secondary | ICD-10-CM | POA: Diagnosis not present

## 2014-03-07 DIAGNOSIS — M545 Low back pain, unspecified: Secondary | ICD-10-CM | POA: Diagnosis present

## 2014-03-07 DIAGNOSIS — M79609 Pain in unspecified limb: Secondary | ICD-10-CM | POA: Insufficient documentation

## 2014-03-07 DIAGNOSIS — M5137 Other intervertebral disc degeneration, lumbosacral region: Secondary | ICD-10-CM | POA: Diagnosis not present

## 2014-03-07 DIAGNOSIS — M51379 Other intervertebral disc degeneration, lumbosacral region without mention of lumbar back pain or lower extremity pain: Secondary | ICD-10-CM | POA: Insufficient documentation

## 2014-03-07 DIAGNOSIS — M25559 Pain in unspecified hip: Secondary | ICD-10-CM | POA: Insufficient documentation

## 2014-03-19 ENCOUNTER — Other Ambulatory Visit: Payer: Self-pay | Admitting: Family Medicine

## 2014-03-31 ENCOUNTER — Other Ambulatory Visit: Payer: Self-pay | Admitting: Family Medicine

## 2014-03-31 NOTE — Telephone Encounter (Signed)
ok 

## 2014-03-31 NOTE — Telephone Encounter (Signed)
Medication called to pharmacy. 

## 2014-03-31 NOTE — Telephone Encounter (Signed)
Ok to refill??  Last office visit 02/17/2014.  Last refill 12/16/2013, #2 refills.

## 2014-05-03 ENCOUNTER — Other Ambulatory Visit: Payer: Self-pay | Admitting: Family Medicine

## 2014-05-05 NOTE — Telephone Encounter (Signed)
Okay to refill? 

## 2014-05-05 NOTE — Telephone Encounter (Signed)
Prescription sent to pharmacy.

## 2014-05-05 NOTE — Telephone Encounter (Signed)
Ok to refill??  Last office visit 02/17/2014.  Last refill 03/31/2014.

## 2014-05-05 NOTE — Telephone Encounter (Signed)
Medication called to pharmacy. 

## 2014-05-20 ENCOUNTER — Encounter: Payer: Self-pay | Admitting: Family Medicine

## 2014-05-20 ENCOUNTER — Ambulatory Visit (INDEPENDENT_AMBULATORY_CARE_PROVIDER_SITE_OTHER): Payer: BC Managed Care – PPO | Admitting: Family Medicine

## 2014-05-20 VITALS — BP 130/72 | HR 78 | Temp 97.8°F | Resp 14 | Ht 63.0 in | Wt 151.0 lb

## 2014-05-20 DIAGNOSIS — F32A Depression, unspecified: Secondary | ICD-10-CM

## 2014-05-20 DIAGNOSIS — R1011 Right upper quadrant pain: Secondary | ICD-10-CM

## 2014-05-20 DIAGNOSIS — K219 Gastro-esophageal reflux disease without esophagitis: Secondary | ICD-10-CM

## 2014-05-20 DIAGNOSIS — F419 Anxiety disorder, unspecified: Secondary | ICD-10-CM

## 2014-05-20 DIAGNOSIS — F329 Major depressive disorder, single episode, unspecified: Secondary | ICD-10-CM

## 2014-05-20 LAB — COMPREHENSIVE METABOLIC PANEL
ALT: 19 U/L (ref 0–35)
AST: 18 U/L (ref 0–37)
Albumin: 4.5 g/dL (ref 3.5–5.2)
Alkaline Phosphatase: 64 U/L (ref 39–117)
BUN: 15 mg/dL (ref 6–23)
CO2: 31 mEq/L (ref 19–32)
Calcium: 9.5 mg/dL (ref 8.4–10.5)
Chloride: 100 mEq/L (ref 96–112)
Creat: 1.02 mg/dL (ref 0.50–1.10)
Glucose, Bld: 101 mg/dL — ABNORMAL HIGH (ref 70–99)
Potassium: 4.2 mEq/L (ref 3.5–5.3)
Sodium: 139 mEq/L (ref 135–145)
Total Bilirubin: 1 mg/dL (ref 0.2–1.2)
Total Protein: 7.1 g/dL (ref 6.0–8.3)

## 2014-05-20 LAB — CBC WITH DIFFERENTIAL/PLATELET
Basophils Absolute: 0 10*3/uL (ref 0.0–0.1)
Basophils Relative: 0 % (ref 0–1)
Eosinophils Absolute: 0.1 10*3/uL (ref 0.0–0.7)
Eosinophils Relative: 1 % (ref 0–5)
HCT: 44.4 % (ref 36.0–46.0)
Hemoglobin: 15.4 g/dL — ABNORMAL HIGH (ref 12.0–15.0)
Lymphocytes Relative: 30 % (ref 12–46)
Lymphs Abs: 1.6 10*3/uL (ref 0.7–4.0)
MCH: 32.3 pg (ref 26.0–34.0)
MCHC: 34.7 g/dL (ref 30.0–36.0)
MCV: 93.1 fL (ref 78.0–100.0)
MPV: 9.4 fL (ref 9.4–12.4)
Monocytes Absolute: 0.6 10*3/uL (ref 0.1–1.0)
Monocytes Relative: 11 % (ref 3–12)
Neutro Abs: 3.1 10*3/uL (ref 1.7–7.7)
Neutrophils Relative %: 58 % (ref 43–77)
Platelets: 314 10*3/uL (ref 150–400)
RBC: 4.77 MIL/uL (ref 3.87–5.11)
RDW: 13.3 % (ref 11.5–15.5)
WBC: 5.3 10*3/uL (ref 4.0–10.5)

## 2014-05-20 LAB — LIPASE: Lipase: 42 U/L (ref 0–75)

## 2014-05-20 MED ORDER — PANTOPRAZOLE SODIUM 40 MG PO TBEC: 40.0000 mg | DELAYED_RELEASE_TABLET | Freq: Two times a day (BID) | ORAL | Status: DC

## 2014-05-20 NOTE — Assessment & Plan Note (Signed)
Refer to psychiatry has been on multiple medications  Needs intense therapy as well

## 2014-05-20 NOTE — Assessment & Plan Note (Signed)
Worsening reflux, d/c NSAIDS, fish oil Incresae protonix to BID dosing, stop other OTC meds May need EGD if other work up neg for RUQ pain

## 2014-05-20 NOTE — Progress Notes (Signed)
Patient ID: [name], female   DOB: [birth year], 53 y.o.   MRN: [id number]   Subjective:    Patient ID: [name], female    DOB: 07-Jan-1961, 53 y.o.   MRN: [id number]  Patient presents for 3 month F/U patient here for follow-up. She is here with multiple complaints for feeling like her balance is off to severe depression and severe back pain and acid reflux. However her new complaint is right upper quadrant abdominal pain that radiates towards her belly button is associated with eating though she does try to avoid fatty foods and spicy foods. [name] also been taking a lot of of over-the-counter antacids despite being on Protonix because she states she feels a burning sensation go up her chest towards her ears. Regarding her bowel she typically has constipation and takes stool softeners and marrow likes however last night she began to have some diarrhea  Regarding her depression she's not been set up with psychiatry but will like to proceed with seeing a psychiatrist for medications. She was upset that her MRI did not show anything significant to be causing her severe back pain.    Review Of Systems:  GEN- + fatigue, fever, weight loss,weakness, recent illness HEENT- denies eye drainage, change in vision, nasal discharge, CVS- denies chest pain, palpitations RESP- denies SOB, cough, wheeze ABD- denies N/V, +change in stools, +abd pain GU- denies dysuria, hematuria, dribbling, incontinence MSK- +joint pain, muscle aches, injury Neuro- denies headache, dizziness, syncope, seizure activity       Objective:    BP 130/72 mmHg  Pulse 78  Temp(Src) 97.8 F (36.6 C) (Oral)  Resp 14  Ht 5' 3"  (1.6 m)  Wt 151 lb (68.493 kg)  BMI 26.76 kg/m2 GEN- NAD, alert and oriented x3 HEENT- PERRL, EOMI, non injected sclera, pink conjunctiva, MMM, oropharynx clear Neck- Supple,  CVS- RRR, no murmur RESP-CTAB ABD-NABS,soft,mild TTP RUQ, no rebound, no guarding, neg murphys, no mass  palpated Psych- depressed affect, not anxious appearing, well groomed NEURO-CNII-XII in tact, no deficits EXT- No edema Pulses- Radial 2+        Assessment & Plan:      Problem List Items Addressed This Visit    None      Note: This dictation was prepared with Dragon dictation along with smaller phrase technology. Any transcriptional errors that result from this process are unintentional.

## 2014-05-20 NOTE — Assessment & Plan Note (Signed)
Check lipase, CMET, CBC, H pylori antigen, may nedd RUQ [place] pending results to rule out gallstones

## 2014-05-20 NOTE — Patient Instructions (Addendum)
Increase protonix to twice a day  Psychiatrist in [place]  Stop the meloxicam  Stop the fish oil for now  F/U 3 months

## 2014-05-22 LAB — HELICOBACTER PYLORI ABS-IGG+IGA, BLD
H Pylori IgG: 0.59 {ISR}
HELICOBACTER PYLORI AB, IGA: 2.1 U/mL (ref <9.0)

## 2014-06-03 ENCOUNTER — Telehealth: Payer: Self-pay | Admitting: *Deleted

## 2014-06-03 DIAGNOSIS — G8929 Other chronic pain: Secondary | ICD-10-CM

## 2014-06-03 DIAGNOSIS — M549 Dorsalgia, unspecified: Secondary | ICD-10-CM

## 2014-06-03 DIAGNOSIS — M5136 Other intervertebral disc degeneration, lumbar region: Secondary | ICD-10-CM

## 2014-06-03 NOTE — Telephone Encounter (Signed)
Okay to place referral to Neurosurgery- Dr. Ellene Route- DDD lumbar spine, chronic back pain, ask sabrina to send MRI of spine as well

## 2014-06-03 NOTE — Telephone Encounter (Signed)
Received call from patient.   Reports that she would like referral to orthopedic to have her back and knees assessed d/t pain.   States that she would prefer to see Dr. Ellene Route in [place] on [address].   Also reports that she has been taking (2) Prilosec and has noted that this plan is effective with indigestion.   MD please advise.

## 2014-06-04 NOTE — Telephone Encounter (Signed)
Patient returned call and made aware.

## 2014-06-04 NOTE — Telephone Encounter (Signed)
Referral order placed.   Call placed to patient. [name].

## 2014-06-05 ENCOUNTER — Other Ambulatory Visit: Payer: Self-pay | Admitting: Family Medicine

## 2014-06-05 NOTE — Telephone Encounter (Signed)
Ok to refill Xanax??  Last office visit 05/20/2014.  Last refill 05/05/2014.

## 2014-06-06 ENCOUNTER — Telehealth: Payer: Self-pay | Admitting: Family Medicine

## 2014-06-06 DIAGNOSIS — R1011 Right upper quadrant pain: Secondary | ICD-10-CM

## 2014-06-06 NOTE — Telephone Encounter (Signed)
okay

## 2014-06-06 NOTE — Telephone Encounter (Signed)
Patient is calling to speak to you regarding her pain  [phone]

## 2014-06-06 NOTE — Telephone Encounter (Signed)
Returned call to patient.   Reports that she was seen in office a few weeks ago for side and abd pain. Reports that she was advised to take (2) of her PPI.   States that this helped her for the first few days, but now the pain has returned and is increased.   States that MD suggested [place] if pain returned.   MD please advise.

## 2014-06-06 NOTE — Telephone Encounter (Signed)
Rx's called in . 

## 2014-06-06 NOTE — Telephone Encounter (Signed)
Schedule abdominal US at [facility] DX RUQ/epigastric abdominal pain

## 2014-06-09 ENCOUNTER — Telehealth: Payer: Self-pay | Admitting: *Deleted

## 2014-06-09 NOTE — Telephone Encounter (Signed)
Pt called back and aware of appt at [facility]

## 2014-06-09 NOTE — Telephone Encounter (Signed)
Pt has appt to have US done On Wed Dec 09/ at 1:pm pt is to NPO after midnight or NPO 6-8hrs before exam, lmtrc to pt

## 2014-06-09 NOTE — Telephone Encounter (Signed)
Orders placed.   Call placed to patient. [place].

## 2014-06-10 NOTE — Telephone Encounter (Signed)
Patient returned call and made aware.   States that she will be nauseous if she has to be NPO all day long.   Call placed to [facility] by referral nurse and new appointment obtained for [place] at 8am. Patient is to be NPO after midnight.   Call placed to patient and patient made aware.

## 2014-06-10 NOTE — Telephone Encounter (Signed)
Call placed to patient and patient made aware.  

## 2014-06-11 ENCOUNTER — Ambulatory Visit (HOSPITAL_COMMUNITY): Payer: BC Managed Care – PPO

## 2014-06-11 ENCOUNTER — Ambulatory Visit (HOSPITAL_COMMUNITY)
Admission: RE | Admit: 2014-06-11 | Discharge: 2014-06-11 | Disposition: A | Discharge status: Home / Self Care | Payer: BC Managed Care – PPO | Source: Ambulatory Visit | Attending: Family Medicine | Admitting: Family Medicine

## 2014-06-11 ENCOUNTER — Telehealth: Payer: Self-pay | Admitting: Family Medicine

## 2014-06-11 DIAGNOSIS — R1011 Right upper quadrant pain: Secondary | ICD-10-CM | POA: Diagnosis not present

## 2014-06-11 DIAGNOSIS — K760 Fatty (change of) liver, not elsewhere classified: Secondary | ICD-10-CM | POA: Diagnosis not present

## 2014-06-11 NOTE — Telephone Encounter (Signed)
[employer] and stated the referral is still in [employer] Dept for review once reviewed they will call pt with apt

## 2014-06-11 NOTE — Telephone Encounter (Signed)
Noted in referral notes

## 2014-06-12 ENCOUNTER — Encounter (HOSPITAL_COMMUNITY): Payer: Self-pay | Admitting: Cardiovascular Disease

## 2014-06-13 ENCOUNTER — Other Ambulatory Visit: Payer: Self-pay | Admitting: Family Medicine

## 2014-06-13 NOTE — Telephone Encounter (Signed)
Prescription sent to pharmacy.

## 2014-06-13 NOTE — Telephone Encounter (Signed)
okay

## 2014-06-13 NOTE — Telephone Encounter (Signed)
Ok to refill??  Last office visit 05/10/2014.  Last refill 05/05/2014.

## 2014-07-04 HISTORY — PX: CARDIAC CATHETERIZATION: SHX172

## 2014-07-07 ENCOUNTER — Other Ambulatory Visit: Payer: Self-pay | Admitting: Gastroenterology

## 2014-07-08 ENCOUNTER — Other Ambulatory Visit: Payer: Self-pay | Admitting: Family Medicine

## 2014-07-08 NOTE — Telephone Encounter (Signed)
Ok to refill Xanax??   Last office visit 05/20/2014.  Last refill 06/10/2014.

## 2014-07-08 NOTE — Telephone Encounter (Signed)
Okay to refill? 

## 2014-07-08 NOTE — Telephone Encounter (Signed)
Medication called to pharmacy. 

## 2014-07-09 ENCOUNTER — Other Ambulatory Visit: Payer: Self-pay | Admitting: Gastroenterology

## 2014-07-09 ENCOUNTER — Other Ambulatory Visit: Payer: Self-pay | Admitting: Family Medicine

## 2014-07-09 DIAGNOSIS — R1011 Right upper quadrant pain: Secondary | ICD-10-CM

## 2014-07-10 NOTE — Telephone Encounter (Signed)
Refill appropriate and filled per protocol. 

## 2014-07-16 ENCOUNTER — Ambulatory Visit (HOSPITAL_COMMUNITY)
Admission: RE | Admit: 2014-07-16 | Discharge: 2014-07-16 | Disposition: A | Discharge status: Home / Self Care | Payer: 59 | Source: Ambulatory Visit | Attending: Gastroenterology | Admitting: Gastroenterology

## 2014-07-16 DIAGNOSIS — R110 Nausea: Secondary | ICD-10-CM | POA: Insufficient documentation

## 2014-07-16 DIAGNOSIS — R1011 Right upper quadrant pain: Secondary | ICD-10-CM | POA: Diagnosis present

## 2014-07-16 MED ORDER — STERILE WATER FOR INJECTION IJ SOLN
INTRAMUSCULAR | Status: AC
  Filled 2014-07-16: qty 10

## 2014-07-16 MED ORDER — SINCALIDE 5 MCG IJ SOLR
0.0200 ug/kg | Freq: Once | INTRAMUSCULAR | Status: AC
  Administered 2014-07-16: 1.31 ug via INTRAVENOUS

## 2014-07-16 MED ORDER — SINCALIDE 5 MCG IJ SOLR
INTRAMUSCULAR | Status: AC
  Administered 2014-07-16: 1.31 ug via INTRAVENOUS
  Filled 2014-07-16: qty 5

## 2014-07-16 MED ORDER — TECHNETIUM TC 99M MEBROFENIN IV KIT
5.0000 | PACK | Freq: Once | INTRAVENOUS | Status: AC | PRN
  Administered 2014-07-16: 5 via INTRAVENOUS

## 2014-07-22 ENCOUNTER — Encounter (HOSPITAL_COMMUNITY): Payer: Self-pay | Admitting: Psychiatry

## 2014-07-22 ENCOUNTER — Ambulatory Visit (INDEPENDENT_AMBULATORY_CARE_PROVIDER_SITE_OTHER): Payer: 59 | Admitting: Psychiatry

## 2014-07-22 VITALS — BP 125/81 | HR 77 | Ht 63.0 in | Wt 144.0 lb

## 2014-07-22 DIAGNOSIS — F332 Major depressive disorder, recurrent severe without psychotic features: Secondary | ICD-10-CM

## 2014-07-22 DIAGNOSIS — F900 Attention-deficit hyperactivity disorder, predominantly inattentive type: Secondary | ICD-10-CM

## 2014-07-22 DIAGNOSIS — F411 Generalized anxiety disorder: Secondary | ICD-10-CM

## 2014-07-22 MED ORDER — METHYLPHENIDATE HCL 10 MG PO TABS: 10.0000 mg | ORAL_TABLET | Freq: Two times a day (BID) | ORAL | Status: DC
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
## 2014-07-28 ENCOUNTER — Encounter (HOSPITAL_COMMUNITY): Payer: Self-pay | Admitting: *Deleted

## 2014-07-28 NOTE — Progress Notes (Signed)
Prior Auth Methylphenidate HCL Approved until 07-29-2015 [phone]

## 2014-07-30 ENCOUNTER — Telehealth (HOSPITAL_COMMUNITY): Payer: Self-pay | Admitting: *Deleted

## 2014-07-30 NOTE — Telephone Encounter (Signed)
PHONE CALL FROM PATIENT, HER PHARMACY SAID HER INSURANCE WILL PAY FOR GENERIC RITALIN.

## 2014-07-31 NOTE — Telephone Encounter (Signed)
Called pt back and she states that her insurance will not pay for her medication and she needed authorization from office. Informed pt that she would need to have her pharmacy send a Prior Auth sheet to office to get the process started.

## 2014-08-02 ENCOUNTER — Other Ambulatory Visit: Payer: Self-pay | Admitting: Family Medicine

## 2014-08-04 ENCOUNTER — Telehealth: Payer: Self-pay | Admitting: *Deleted

## 2014-08-04 NOTE — Telephone Encounter (Signed)
Refill appropriate and filled per protocol. 

## 2014-08-04 NOTE — Telephone Encounter (Signed)
Submitted referral thru [name] for [facility] Surgery Referral number [id number]  Spoke to Manuela Schwartz at [facility] and gave her the number verbally  ICD 10: K82.4  Dr. Zella Richer NPI: [id number]

## 2014-08-08 ENCOUNTER — Telehealth: Payer: Self-pay | Admitting: Family Medicine

## 2014-08-08 MED ORDER — OXYCODONE-ACETAMINOPHEN 5-325 MG PO TABS: 1.0000 | ORAL_TABLET | Freq: Four times a day (QID) | ORAL | Status: DC | PRN

## 2014-08-08 NOTE — Telephone Encounter (Signed)
Patient is calling to get refill on her oxycodone  [phone]

## 2014-08-08 NOTE — Telephone Encounter (Signed)
Ok to refill??  Last office visit 05/20/2014.  Last refill 11/05/2013???

## 2014-08-08 NOTE — Telephone Encounter (Signed)
Prescription printed and patient made aware to come to office to pick up.

## 2014-08-08 NOTE — Telephone Encounter (Signed)
Okay to refill? 

## 2014-08-11 ENCOUNTER — Other Ambulatory Visit: Payer: Self-pay | Admitting: Family Medicine

## 2014-08-11 NOTE — Telephone Encounter (Signed)
Ok to refill??  Last office visit 05/20/2014.  Last refill 07/08/2014.

## 2014-08-11 NOTE — Telephone Encounter (Signed)
Medication called to pharmacy. 

## 2014-08-11 NOTE — Telephone Encounter (Signed)
Okay to refill , give 2

## 2014-08-18 ENCOUNTER — Ambulatory Visit (HOSPITAL_COMMUNITY): Payer: Self-pay | Admitting: Psychiatry

## 2014-08-18 ENCOUNTER — Other Ambulatory Visit (INDEPENDENT_AMBULATORY_CARE_PROVIDER_SITE_OTHER): Payer: Self-pay | Admitting: General Surgery

## 2014-08-18 DIAGNOSIS — R1011 Right upper quadrant pain: Secondary | ICD-10-CM

## 2014-08-18 NOTE — Addendum Note (Signed)
Addended by: Odis Hollingshead on: 08/18/2014 07:33 PM   Modules accepted: Orders

## 2014-08-19 ENCOUNTER — Ambulatory Visit (HOSPITAL_COMMUNITY): Payer: Self-pay | Admitting: Psychiatry

## 2014-08-21 ENCOUNTER — Other Ambulatory Visit (INDEPENDENT_AMBULATORY_CARE_PROVIDER_SITE_OTHER): Payer: Self-pay | Admitting: *Deleted

## 2014-08-21 DIAGNOSIS — R1011 Right upper quadrant pain: Secondary | ICD-10-CM

## 2014-08-22 ENCOUNTER — Ambulatory Visit: Payer: BC Managed Care – PPO | Admitting: Family Medicine

## 2014-08-26 ENCOUNTER — Ambulatory Visit
Admission: RE | Admit: 2014-08-26 | Discharge: 2014-08-26 | Disposition: A | Discharge status: Home / Self Care | Payer: 59 | Source: Ambulatory Visit | Attending: General Surgery | Admitting: General Surgery

## 2014-08-26 MED ORDER — IOHEXOL 300 MG/ML  SOLN
100.0000 mL | Freq: Once | INTRAMUSCULAR | Status: AC | PRN
  Administered 2014-08-26: 100 mL via INTRAVENOUS

## 2014-08-27 ENCOUNTER — Encounter (HOSPITAL_COMMUNITY): Payer: Self-pay | Admitting: Psychiatry

## 2014-08-27 ENCOUNTER — Ambulatory Visit (INDEPENDENT_AMBULATORY_CARE_PROVIDER_SITE_OTHER): Payer: 59 | Admitting: Psychiatry

## 2014-08-27 VITALS — BP 116/81 | HR 88 | Ht 63.0 in | Wt 140.0 lb

## 2014-08-27 DIAGNOSIS — F900 Attention-deficit hyperactivity disorder, predominantly inattentive type: Secondary | ICD-10-CM

## 2014-08-27 DIAGNOSIS — F332 Major depressive disorder, recurrent severe without psychotic features: Secondary | ICD-10-CM

## 2014-08-27 DIAGNOSIS — F411 Generalized anxiety disorder: Secondary | ICD-10-CM

## 2014-08-27 MED ORDER — DULOXETINE HCL 60 MG PO CPEP: 60.0000 mg | ORAL_CAPSULE | Freq: Every day | ORAL | Status: DC

## 2014-08-27 MED ORDER — TRAZODONE HCL 50 MG PO TABS: 100.0000 mg | ORAL_TABLET | Freq: Every day | ORAL | Status: DC

## 2014-08-27 MED ORDER — METHYLPHENIDATE HCL 10 MG PO TABS: 10.0000 mg | ORAL_TABLET | Freq: Two times a day (BID) | ORAL | Status: DC

## 2014-08-27 MED ORDER — VENLAFAXINE HCL ER 75 MG PO CP24: 75.0000 mg | ORAL_CAPSULE | Freq: Every day | ORAL | Status: DC
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
## 2014-08-28 ENCOUNTER — Telehealth (HOSPITAL_COMMUNITY): Payer: Self-pay | Admitting: *Deleted

## 2014-08-28 ENCOUNTER — Other Ambulatory Visit (INDEPENDENT_AMBULATORY_CARE_PROVIDER_SITE_OTHER): Payer: Self-pay | Admitting: General Surgery

## 2014-08-28 NOTE — Telephone Encounter (Signed)
Route to [place], she can try [employer], it is cheaper there

## 2014-08-28 NOTE — Telephone Encounter (Signed)
the Cymbalta is $80, can't afford it.   If there is a generic she would like to try that.

## 2014-08-28 NOTE — Telephone Encounter (Signed)
Per pt, she is using [place] and this is the amount they are charging her. Offered pt to start Pt Assistance and pt agreed to do so.

## 2014-08-29 ENCOUNTER — Ambulatory Visit (HOSPITAL_COMMUNITY): Payer: Self-pay | Admitting: Psychiatry

## 2014-08-29 NOTE — Telephone Encounter (Signed)
Per pt, she will just keep with the process for now and if she does need another medication to bridge her over, she will call office back.

## 2014-08-29 NOTE — Telephone Encounter (Signed)
Does she want another med in the meantime?

## 2014-08-29 NOTE — Telephone Encounter (Signed)
ok 

## 2014-09-02 ENCOUNTER — Other Ambulatory Visit (HOSPITAL_COMMUNITY): Payer: Self-pay | Admitting: Psychiatry

## 2014-09-02 ENCOUNTER — Ambulatory Visit (INDEPENDENT_AMBULATORY_CARE_PROVIDER_SITE_OTHER): Payer: 59 | Admitting: Psychiatry

## 2014-09-02 ENCOUNTER — Encounter (HOSPITAL_COMMUNITY): Payer: Self-pay | Admitting: Psychiatry

## 2014-09-02 ENCOUNTER — Telehealth (HOSPITAL_COMMUNITY): Payer: Self-pay | Admitting: *Deleted

## 2014-09-02 DIAGNOSIS — F332 Major depressive disorder, recurrent severe without psychotic features: Secondary | ICD-10-CM

## 2014-09-02 MED ORDER — DULOXETINE HCL 60 MG PO CPEP: 60.0000 mg | ORAL_CAPSULE | Freq: Every day | ORAL | Status: DC

## 2014-09-02 NOTE — Telephone Encounter (Signed)
Trying to help pt fill out patient assistant form for her Cymbalta and [facility] Patient Assistance Program is requesting a copy of the printed script for pt sent in with the form.
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
## 2014-09-02 NOTE — Patient Instructions (Signed)
Discussed orally 

## 2014-09-02 NOTE — Telephone Encounter (Signed)
printed

## 2014-09-03 ENCOUNTER — Telehealth: Payer: Self-pay | Admitting: Family Medicine

## 2014-09-03 NOTE — Telephone Encounter (Signed)
Patient is calling to get information on patient assistance and would like to speak to you regarding this  [phone]

## 2014-09-03 NOTE — Telephone Encounter (Signed)
Received call from patient.   States that she requires assistance with prescriptions.   Assistance forms mailed to patient.

## 2014-09-16 ENCOUNTER — Ambulatory Visit (HOSPITAL_COMMUNITY): Payer: Self-pay | Admitting: Psychiatry

## 2014-09-22 ENCOUNTER — Ambulatory Visit (INDEPENDENT_AMBULATORY_CARE_PROVIDER_SITE_OTHER): Payer: 59 | Admitting: Psychiatry

## 2014-09-22 DIAGNOSIS — F332 Major depressive disorder, recurrent severe without psychotic features: Secondary | ICD-10-CM

## 2014-09-22 NOTE — Progress Notes (Signed)
   THERAPIST PROGRESS NOTE  Session Time: Monday 09/22/2014 1:10 PM -1:55 PM  Participation Level: Active  Behavioral Response: CasualAlertAnxious and Depressed  Type of Therapy: Individual Therapy  Treatment Goals addressed: Establish therapeutic alliance, improve ability to manage stress and anxiety  Interventions: Supportive  Summary: [name] is a 54 y.o. female who is referred for services by psychiatrist Dr. Harrington Challenger to improve coping skills .She is in the process of applying for disability. She suffered a stroke in 1999 resulting in left-side inattention. She had been working and doing well until 2 years ago. She began to experience increased attention issues and anxiety often crying daily. She reports being slow and unable to perform tasks to meet employer demands resulting in patient being unable to work. She reports being clumsy, falling frequently, and unable to stand for long periods. She reports poor motor skills in left hand. She also suffers from arthritis and back problems.  Patient becomes easily agitated and easily distracted. Other stressors include financial issues including having a lot of medical bills and being delinquent on various bills. Husband listens to her problems but isn't understanding. She states life in general is stressful because she can't do things she used to do including simple activities. She reports feeling guilty husband has to perform many of the household tasks.  Patient reports continued stress, depression, and anxiety since last session. She reports multiple health issues and is worried medical providers may be overlooking a medical condition. Patient fears she may develop cancer as she has a strong family history of cancer. Patient continues to face financial stress and reports this is affecting her ability to obtain medical services. She is thankful she received a financial contribution from her church yesterday to assist in covering financial  expenses for patient to have gallbladder surgery. She reports increased mood swings and irritability as she states being tired of being in pain. She reports chronic back pain. She also reports worry about various family members including her brother and her sister. Patient reports feeling like she is trying to hold her family together. Patient also expresses frustration regarding memory difficulty and poor concentration. She is taking Ritalin and will talk to psychiatrist Dr. Harrington Challenger about this during her next visit.   Suicidal/Homicidal: Patient admits feelings of hopelessness and fleeting suicidal ideations but no plan and no intent.  She states she wouldn't want to do this to her mother. Patient agrees to call this pratcice, call 911, or have someone  take her to the ER should symptoms worsen.    Therapist Response: Therapist works with patient to establish rapport, process feelings, identifying support system, and practice relaxation technique using diaphragmatic breathing.  Plan: Return again in 2 weeks.  Diagnosis: Axis I:  Major depressive disorder, recurrent, severe without psychotic features    Axis II: Deferred    Kenton Fortin, LCSW 09/22/2014

## 2014-09-22 NOTE — Patient Instructions (Signed)
Discussed orally 

## 2014-09-23 ENCOUNTER — Telehealth: Payer: Self-pay | Admitting: Family Medicine

## 2014-09-23 ENCOUNTER — Telehealth (HOSPITAL_COMMUNITY): Payer: Self-pay | Admitting: *Deleted

## 2014-09-23 NOTE — Telephone Encounter (Signed)
Pt applied for patient assistance for Cymbalta. All information that was provided by pt and printed script from Dr. Harrington Challenger was faxed to [facility] Patient Assistant 09-19-14. On 09-23-14, received a package that had 4 bottles of Cymbalta 60 mg with 30 tablets in each. Please within that package, it stated pt's name and item code number which is [phone]. Called pt to inform her of the receivable of her Free Cymbalta. Pt shows understanding and will come by office to pick up prescribes 09-24-14.

## 2014-09-23 NOTE — Telephone Encounter (Signed)
Call pt she left a letter about vaginal discharge or UTI symptoms which was confusion, have her schedule with GYN- Dr. Glo Herring, they can run a urine test there as well if needed

## 2014-09-24 ENCOUNTER — Ambulatory Visit (HOSPITAL_COMMUNITY): Payer: Self-pay | Admitting: Psychiatry

## 2014-09-24 NOTE — Telephone Encounter (Signed)
Call placed to patient and patient made aware.  

## 2014-09-24 NOTE — Telephone Encounter (Signed)
Call placed to patient. LMTRC.  

## 2014-10-02 ENCOUNTER — Encounter (HOSPITAL_COMMUNITY): Payer: Self-pay | Admitting: Psychiatry

## 2014-10-02 ENCOUNTER — Ambulatory Visit (INDEPENDENT_AMBULATORY_CARE_PROVIDER_SITE_OTHER): Payer: 59 | Admitting: Psychiatry

## 2014-10-02 ENCOUNTER — Telehealth (HOSPITAL_COMMUNITY): Payer: Self-pay | Admitting: *Deleted

## 2014-10-02 VITALS — BP 131/80 | HR 80 | Ht 63.0 in | Wt 138.0 lb

## 2014-10-02 DIAGNOSIS — F900 Attention-deficit hyperactivity disorder, predominantly inattentive type: Secondary | ICD-10-CM | POA: Diagnosis not present

## 2014-10-02 DIAGNOSIS — F332 Major depressive disorder, recurrent severe without psychotic features: Secondary | ICD-10-CM

## 2014-10-02 DIAGNOSIS — F411 Generalized anxiety disorder: Secondary | ICD-10-CM

## 2014-10-02 MED ORDER — METHYLPHENIDATE HCL 20 MG PO TABS: 20.0000 mg | ORAL_TABLET | Freq: Two times a day (BID) | ORAL | Status: DC

## 2014-10-02 NOTE — Patient Instructions (Signed)
Start Cymbalta 60 mg daily Take one Effexor daily for one week, then stop Start ritalin 20 mg twice a day

## 2014-10-02 NOTE — Telephone Encounter (Signed)
[facility] patient assistant sent 4 bottles of that have 30 tablets in each for pt to have due to her qualifying for assistance. Pt came to office today 10-02-14 to pick up prescriptions. Pt d/l number is [id number]. Pt script number is [phone] ([id number]) and lot number is [id number].
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
## 2014-10-07 ENCOUNTER — Telehealth: Payer: Self-pay | Admitting: *Deleted

## 2014-10-07 ENCOUNTER — Encounter (HOSPITAL_COMMUNITY): Payer: Self-pay

## 2014-10-07 ENCOUNTER — Encounter (HOSPITAL_COMMUNITY)
Admission: RE | Admit: 2014-10-07 | Discharge: 2014-10-07 | Disposition: A | Discharge status: Home / Self Care | Payer: 59 | Source: Ambulatory Visit | Attending: General Surgery | Admitting: General Surgery

## 2014-10-07 DIAGNOSIS — K811 Chronic cholecystitis: Secondary | ICD-10-CM | POA: Diagnosis present

## 2014-10-07 DIAGNOSIS — Z8673 Personal history of transient ischemic attack (TIA), and cerebral infarction without residual deficits: Secondary | ICD-10-CM | POA: Diagnosis not present

## 2014-10-07 DIAGNOSIS — Z79891 Long term (current) use of opiate analgesic: Secondary | ICD-10-CM | POA: Diagnosis not present

## 2014-10-07 DIAGNOSIS — K219 Gastro-esophageal reflux disease without esophagitis: Secondary | ICD-10-CM | POA: Diagnosis not present

## 2014-10-07 DIAGNOSIS — E785 Hyperlipidemia, unspecified: Secondary | ICD-10-CM | POA: Diagnosis not present

## 2014-10-07 DIAGNOSIS — Z79899 Other long term (current) drug therapy: Secondary | ICD-10-CM | POA: Diagnosis not present

## 2014-10-07 DIAGNOSIS — R9431 Abnormal electrocardiogram [ECG] [EKG]: Secondary | ICD-10-CM | POA: Diagnosis not present

## 2014-10-07 DIAGNOSIS — M1990 Unspecified osteoarthritis, unspecified site: Secondary | ICD-10-CM | POA: Diagnosis not present

## 2014-10-07 DIAGNOSIS — F319 Bipolar disorder, unspecified: Secondary | ICD-10-CM | POA: Diagnosis not present

## 2014-10-07 DIAGNOSIS — F419 Anxiety disorder, unspecified: Secondary | ICD-10-CM | POA: Diagnosis not present

## 2014-10-07 DIAGNOSIS — I10 Essential (primary) hypertension: Secondary | ICD-10-CM | POA: Diagnosis not present

## 2014-10-07 DIAGNOSIS — Z87891 Personal history of nicotine dependence: Secondary | ICD-10-CM | POA: Diagnosis not present

## 2014-10-07 DIAGNOSIS — G4730 Sleep apnea, unspecified: Secondary | ICD-10-CM | POA: Diagnosis not present

## 2014-10-07 HISTORY — DX: Bipolar disorder, unspecified: F31.9

## 2014-10-07 HISTORY — DX: Constipation, unspecified: K59.00

## 2014-10-07 HISTORY — DX: Carpal tunnel syndrome, unspecified upper limb: G56.00

## 2014-10-07 HISTORY — DX: Hyperlipidemia, unspecified: E78.5

## 2014-10-07 HISTORY — DX: Unspecified osteoarthritis, unspecified site: M19.90

## 2014-10-07 LAB — COMPREHENSIVE METABOLIC PANEL
ALT: 21 U/L (ref 0–35)
AST: 20 U/L (ref 0–37)
Albumin: 4.5 g/dL (ref 3.5–5.2)
Alkaline Phosphatase: 55 U/L (ref 39–117)
Anion gap: 9 (ref 5–15)
BUN: 12 mg/dL (ref 6–23)
CO2: 32 mmol/L (ref 19–32)
Calcium: 9.7 mg/dL (ref 8.4–10.5)
Chloride: 99 mmol/L (ref 96–112)
Creatinine, Ser: 0.92 mg/dL (ref 0.50–1.10)
GFR calc Af Amer: 82 mL/min — ABNORMAL LOW (ref >90)
GFR calc non Af Amer: 71 mL/min — ABNORMAL LOW (ref >90)
Glucose, Bld: 89 mg/dL (ref 70–99)
Potassium: 3.6 mmol/L (ref 3.5–5.1)
Sodium: 140 mmol/L (ref 135–145)
Total Bilirubin: 0.8 mg/dL (ref 0.3–1.2)
Total Protein: 7.3 g/dL (ref 6.0–8.3)

## 2014-10-07 LAB — CBC WITH DIFFERENTIAL/PLATELET
Basophils Absolute: 0 10*3/uL (ref 0.0–0.1)
Basophils Relative: 0 % (ref 0–1)
Eosinophils Absolute: 0.1 10*3/uL (ref 0.0–0.7)
Eosinophils Relative: 2 % (ref 0–5)
HCT: 41 % (ref 36.0–46.0)
Hemoglobin: 14.4 g/dL (ref 12.0–15.0)
Lymphocytes Relative: 48 % — ABNORMAL HIGH (ref 12–46)
Lymphs Abs: 2.4 10*3/uL (ref 0.7–4.0)
MCH: 32.7 pg (ref 26.0–34.0)
MCHC: 35.1 g/dL (ref 30.0–36.0)
MCV: 93 fL (ref 78.0–100.0)
Monocytes Absolute: 0.3 10*3/uL (ref 0.1–1.0)
Monocytes Relative: 6 % (ref 3–12)
Neutro Abs: 2.3 10*3/uL (ref 1.7–7.7)
Neutrophils Relative %: 44 % (ref 43–77)
Platelets: 274 10*3/uL (ref 150–400)
RBC: 4.41 MIL/uL (ref 3.87–5.11)
RDW: 12 % (ref 11.5–15.5)
WBC: 5.1 10*3/uL (ref 4.0–10.5)

## 2014-10-07 LAB — PROTIME-INR
INR: 1.05 (ref 0.00–1.49)
Prothrombin Time: 13.8 seconds (ref 11.6–15.2)

## 2014-10-07 NOTE — Progress Notes (Signed)
Patient arrived to PAT at 1300 for a 1200 appointment. PCP is Vic Blackbird. Patient informed Nurse that she had some chest pain two days ago while on her computer, but stated it goes away after she takes Tylenol or after she calms down. Patient denied having any current chest pain or shortness of breath. Patient informed Nurse that the chest pain she experiences is "anxiety" related and not cardiac related. Will obtain EKG today and have [place], [place] review.   Patient denied having any current suicidal ideation.

## 2014-10-07 NOTE — Pre-Procedure Instructions (Signed)
[name]  10/07/2014   Your procedure is scheduled on:  Tuesday October 14, 2014 at 9:45 AM.  Report to [facility] Admitting at 7:45 AM.  Call this number if you have problems the morning of surgery: [phone]   Remember:   Do not eat food or drink liquids after midnight.   Take these medicines the morning of surgery with A SIP OF WATER: Acetaminophen (Tylenol) if needed, Dicyclomine (Bentyl), Duloxetine (Cymbalta), Oxycodone if needed, Pantoprazole (Protonix), Venlafaxine (Effexor XR)   Please stop taking any vitamins, herbal mediations, aspirin, Advil, Motrin, Alleve, etc   Do not wear jewelry, make-up or nail polish.  Do not wear lotions, powders, or perfumes.   Do not shave 48 hours prior to surgery.   Do not bring valuables to the hospital.  [facility] is not responsible for any belongings or valuables.               Contacts, dentures or bridgework may not be worn into surgery.  Leave suitcase in the car. After surgery it may be brought to your room.  For patients admitted to the hospital, discharge time is determined by your treatment team.               Patients discharged the day of surgery will not be allowed to drive home.  Name and phone number of your driver:   Special Instructions: Shower using CHG soap the night before and the morning of your surgery   Please read over the following fact sheets that you were given: Pain Booklet, Coughing and Deep Breathing and Surgical Site Infection Prevention

## 2014-10-07 NOTE — Telephone Encounter (Signed)
Received patient completed forms for prescription assistance for Effexor and Zocor.    Forms completed and given to MD for signature.   Mailed to correct assistance program.

## 2014-10-08 ENCOUNTER — Encounter (HOSPITAL_COMMUNITY): Payer: Self-pay

## 2014-10-08 NOTE — Progress Notes (Signed)
Anesthesia Chart Review:  Patient is a 54 year old female scheduled for lap chole on 10/14/14 by Dr. Zella Richer.    History includes former smoker, HTN, HLD, GERD, anxiety, depression, Bipolar disorder, hemorrhagic CVA with left sided weakness s/p surgery '99, chest pain with history of normal coronaries in 2013, cervical fusion, hysterectomy. She reports occasional chest pains during anxiety attacks (not new), but no exertional symptoms such as CP of SOB. BMI 24.86.  OSA screening score was 4. PCP is listed as Dr. Vic Blackbird. Psychiatrist is Dr. [name]. She was seen by cardiology in 2013 (last with Dr. Lattie Haw in 02/2012) for evaluation of chest pain and was found to have normal coronaries.  PRN cardiology follow-up was recommended.   PAT Vitals: BP 124/66, HR 69, RR 20, T 36.6 T, O2 sat 100%. Denied CP at PAT.  Meds include Xanax, Flexeril, Bentyl, Cymbalta, HCTZ, Ritalin, Protonix, Zocor, Desyrel, Effexor.   10/07/14 EKG: NSR, low voltage QRS, non-specific T wave abnormality, prolonged QT.  Since last tracing 11/14/13 rate is slower.   09/23/11 cardiac cath (Dr. [name]):  Final Conclusions:  1. Widely patent coronary arteries 2. Normal left ventricular systolic function, EF 50-51% Recommendations: Suspect noncardiac chest pain. I don't explain the patient's elevated troponin. She did have markedly elevated blood pressure on admission. She is symptom-free now. I would recommend overnight observation and she should be eligible for discharge tomorrow morning if she is having no symptomatic complaints at that time. I would not recommend putting the patient on any antiplatelet therapy with her history of intracranial hemorrhage. She does not have significant CAD and really shouldn't get much benefit from an antiplatelet drug. She needs close followup of her hypertension as I suspect this was related to hypertensive crisis. The patient can be followed long-term by her primary care  physician.  Preoperative labs noted.   EKG appears stable. Normal coronaries by cath just over three years ago.  If no acute changes then I anticipate that she can proceed as planned.  [facility]/Anesthesiology Phone [phone] 10/08/2014 5:07 PM

## 2014-10-10 ENCOUNTER — Ambulatory Visit (HOSPITAL_COMMUNITY): Payer: Self-pay | Admitting: Psychiatry

## 2014-10-13 MED ORDER — CEFAZOLIN SODIUM-DEXTROSE 2-3 GM-% IV SOLR
2.0000 g | INTRAVENOUS | Status: AC
  Administered 2014-10-14: 2000 mg via INTRAVENOUS
  Filled 2014-10-13: qty 50

## 2014-10-14 ENCOUNTER — Ambulatory Visit (HOSPITAL_COMMUNITY)
Admission: RE | Admit: 2014-10-14 | Discharge: 2014-10-14 | Disposition: A | Discharge status: Home / Self Care | Payer: 59 | Source: Ambulatory Visit | Attending: General Surgery | Admitting: General Surgery

## 2014-10-14 ENCOUNTER — Encounter (HOSPITAL_COMMUNITY)
Admission: RE | Disposition: A | Discharge status: Home / Self Care | Payer: Self-pay | Source: Ambulatory Visit | Attending: General Surgery

## 2014-10-14 ENCOUNTER — Encounter (HOSPITAL_COMMUNITY): Payer: Self-pay | Admitting: Certified Registered Nurse Anesthetist

## 2014-10-14 ENCOUNTER — Ambulatory Visit (HOSPITAL_COMMUNITY): Payer: 59 | Admitting: Vascular Surgery

## 2014-10-14 ENCOUNTER — Ambulatory Visit (HOSPITAL_COMMUNITY): Payer: 59 | Admitting: Certified Registered Nurse Anesthetist

## 2014-10-14 ENCOUNTER — Ambulatory Visit (HOSPITAL_COMMUNITY): Payer: 59

## 2014-10-14 DIAGNOSIS — Z8673 Personal history of transient ischemic attack (TIA), and cerebral infarction without residual deficits: Secondary | ICD-10-CM | POA: Insufficient documentation

## 2014-10-14 DIAGNOSIS — R9431 Abnormal electrocardiogram [ECG] [EKG]: Secondary | ICD-10-CM | POA: Insufficient documentation

## 2014-10-14 DIAGNOSIS — M1990 Unspecified osteoarthritis, unspecified site: Secondary | ICD-10-CM | POA: Insufficient documentation

## 2014-10-14 DIAGNOSIS — E785 Hyperlipidemia, unspecified: Secondary | ICD-10-CM | POA: Insufficient documentation

## 2014-10-14 DIAGNOSIS — I10 Essential (primary) hypertension: Secondary | ICD-10-CM | POA: Insufficient documentation

## 2014-10-14 DIAGNOSIS — Z87891 Personal history of nicotine dependence: Secondary | ICD-10-CM | POA: Insufficient documentation

## 2014-10-14 DIAGNOSIS — K219 Gastro-esophageal reflux disease without esophagitis: Secondary | ICD-10-CM | POA: Insufficient documentation

## 2014-10-14 DIAGNOSIS — Z79891 Long term (current) use of opiate analgesic: Secondary | ICD-10-CM | POA: Insufficient documentation

## 2014-10-14 DIAGNOSIS — F419 Anxiety disorder, unspecified: Secondary | ICD-10-CM | POA: Insufficient documentation

## 2014-10-14 DIAGNOSIS — K811 Chronic cholecystitis: Secondary | ICD-10-CM | POA: Insufficient documentation

## 2014-10-14 DIAGNOSIS — G4730 Sleep apnea, unspecified: Secondary | ICD-10-CM | POA: Insufficient documentation

## 2014-10-14 DIAGNOSIS — F319 Bipolar disorder, unspecified: Secondary | ICD-10-CM | POA: Insufficient documentation

## 2014-10-14 DIAGNOSIS — Z79899 Other long term (current) drug therapy: Secondary | ICD-10-CM | POA: Insufficient documentation

## 2014-10-14 HISTORY — PX: CHOLECYSTECTOMY: SHX55

## 2014-10-14 SURGERY — LAPAROSCOPIC CHOLECYSTECTOMY WITH INTRAOPERATIVE CHOLANGIOGRAM
Anesthesia: General | Site: Abdomen

## 2014-10-14 MED ORDER — LABETALOL HCL 5 MG/ML IV SOLN
INTRAVENOUS | Status: AC
  Filled 2014-10-14: qty 4

## 2014-10-14 MED ORDER — FENTANYL CITRATE 0.05 MG/ML IJ SOLN
INTRAMUSCULAR | Status: AC
  Filled 2014-10-14: qty 5

## 2014-10-14 MED ORDER — SODIUM CHLORIDE 0.9 % IV SOLN
INTRAVENOUS | Status: DC | PRN
  Administered 2014-10-14: 38 mL

## 2014-10-14 MED ORDER — OXYCODONE HCL 5 MG PO TABS: 5.0000 mg | ORAL_TABLET | ORAL | Status: DC | PRN

## 2014-10-14 MED ORDER — FENTANYL CITRATE 0.05 MG/ML IJ SOLN
INTRAMUSCULAR | Status: DC | PRN
  Administered 2014-10-14 (×3): 50 ug via INTRAVENOUS
  Administered 2014-10-14: 100 ug via INTRAVENOUS

## 2014-10-14 MED ORDER — ACETAMINOPHEN 325 MG PO TABS: 650.0000 mg | ORAL_TABLET | ORAL | Status: DC | PRN

## 2014-10-14 MED ORDER — HYDROMORPHONE HCL 1 MG/ML IJ SOLN
INTRAMUSCULAR | Status: AC
  Administered 2014-10-14: 0.5 mg via INTRAVENOUS
  Filled 2014-10-14: qty 1

## 2014-10-14 MED ORDER — ONDANSETRON HCL 4 MG/2ML IJ SOLN: 4.0000 mg | Freq: Once | INTRAMUSCULAR | Status: DC | PRN

## 2014-10-14 MED ORDER — KETOROLAC TROMETHAMINE 30 MG/ML IJ SOLN
INTRAMUSCULAR | Status: AC
  Filled 2014-10-14: qty 1

## 2014-10-14 MED ORDER — GLYCOPYRROLATE 0.2 MG/ML IJ SOLN
INTRAMUSCULAR | Status: DC | PRN
  Administered 2014-10-14: 0.6 mg via INTRAVENOUS

## 2014-10-14 MED ORDER — HYDROMORPHONE HCL 1 MG/ML IJ SOLN
0.2500 mg | INTRAMUSCULAR | Status: DC | PRN
  Administered 2014-10-14 (×2): 0.5 mg via INTRAVENOUS
  Administered 2014-10-14: 0.25 mg via INTRAVENOUS

## 2014-10-14 MED ORDER — LIDOCAINE HCL (CARDIAC) 20 MG/ML IV SOLN
INTRAVENOUS | Status: AC
  Filled 2014-10-14: qty 5

## 2014-10-14 MED ORDER — ARTIFICIAL TEARS OP OINT
TOPICAL_OINTMENT | OPHTHALMIC | Status: DC | PRN
  Administered 2014-10-14: 1 via OPHTHALMIC

## 2014-10-14 MED ORDER — ACETAMINOPHEN 650 MG RE SUPP: 650.0000 mg | RECTAL | Status: DC | PRN

## 2014-10-14 MED ORDER — DEXAMETHASONE SODIUM PHOSPHATE 4 MG/ML IJ SOLN
INTRAMUSCULAR | Status: AC
  Filled 2014-10-14: qty 2

## 2014-10-14 MED ORDER — ROCURONIUM BROMIDE 100 MG/10ML IV SOLN
INTRAVENOUS | Status: DC | PRN
  Administered 2014-10-14: 20 mg via INTRAVENOUS

## 2014-10-14 MED ORDER — OXYCODONE HCL 5 MG PO TABS
5.0000 mg | ORAL_TABLET | ORAL | Status: DC | PRN
  Administered 2014-10-14: 10 mg via ORAL

## 2014-10-14 MED ORDER — ARTIFICIAL TEARS OP OINT
TOPICAL_OINTMENT | OPHTHALMIC | Status: AC
  Filled 2014-10-14: qty 3.5

## 2014-10-14 MED ORDER — PROPOFOL 10 MG/ML IV BOLUS
INTRAVENOUS | Status: DC | PRN
  Administered 2014-10-14: 150 mg via INTRAVENOUS
  Administered 2014-10-14: 50 mg via INTRAVENOUS

## 2014-10-14 MED ORDER — SODIUM CHLORIDE 0.9 % IJ SOLN: 3.0000 mL | INTRAMUSCULAR | Status: DC | PRN

## 2014-10-14 MED ORDER — BUPIVACAINE-EPINEPHRINE 0.25% -1:200000 IJ SOLN
INTRAMUSCULAR | Status: DC | PRN
  Administered 2014-10-14: 8 mL

## 2014-10-14 MED ORDER — OXYCODONE HCL 5 MG PO TABS
ORAL_TABLET | ORAL | Status: AC
  Administered 2014-10-14: 10 mg via ORAL
  Filled 2014-10-14: qty 2

## 2014-10-14 MED ORDER — DEXTROSE IN LACTATED RINGERS 5 % IV SOLN: INTRAVENOUS | Status: DC

## 2014-10-14 MED ORDER — FENTANYL CITRATE 0.05 MG/ML IJ SOLN: 25.0000 ug | INTRAMUSCULAR | Status: DC | PRN

## 2014-10-14 MED ORDER — BUPIVACAINE-EPINEPHRINE (PF) 0.25% -1:200000 IJ SOLN
INTRAMUSCULAR | Status: AC
  Filled 2014-10-14: qty 30

## 2014-10-14 MED ORDER — LIDOCAINE HCL (CARDIAC) 20 MG/ML IV SOLN
INTRAVENOUS | Status: DC | PRN
  Administered 2014-10-14: 100 mg via INTRAVENOUS

## 2014-10-14 MED ORDER — DEXAMETHASONE SODIUM PHOSPHATE 4 MG/ML IJ SOLN
INTRAMUSCULAR | Status: DC | PRN
  Administered 2014-10-14: 8 mg via INTRAVENOUS

## 2014-10-14 MED ORDER — PHENYLEPHRINE HCL 10 MG/ML IJ SOLN
INTRAMUSCULAR | Status: DC | PRN
  Administered 2014-10-14: 120 ug via INTRAVENOUS

## 2014-10-14 MED ORDER — LACTATED RINGERS IV SOLN
INTRAVENOUS | Status: DC | PRN
  Administered 2014-10-14 (×2): via INTRAVENOUS

## 2014-10-14 MED ORDER — ONDANSETRON HCL 4 MG/2ML IJ SOLN
INTRAMUSCULAR | Status: DC | PRN
  Administered 2014-10-14: 4 mg via INTRAVENOUS

## 2014-10-14 MED ORDER — 0.9 % SODIUM CHLORIDE (POUR BTL) OPTIME
TOPICAL | Status: DC | PRN
  Administered 2014-10-14: 1000 mL

## 2014-10-14 MED ORDER — KETOROLAC TROMETHAMINE 30 MG/ML IJ SOLN
INTRAMUSCULAR | Status: DC | PRN
  Administered 2014-10-14: 30 mg via INTRAVENOUS

## 2014-10-14 MED ORDER — SODIUM CHLORIDE 0.9 % IR SOLN
Status: DC | PRN
  Administered 2014-10-14: 1000 mL

## 2014-10-14 MED ORDER — LABETALOL HCL 5 MG/ML IV SOLN
INTRAVENOUS | Status: DC | PRN
  Administered 2014-10-14: 5 mg via INTRAVENOUS

## 2014-10-14 MED ORDER — PROPOFOL 10 MG/ML IV BOLUS
INTRAVENOUS | Status: AC
  Filled 2014-10-14: qty 20

## 2014-10-14 MED ORDER — ONDANSETRON HCL 4 MG/2ML IJ SOLN
INTRAMUSCULAR | Status: AC
  Filled 2014-10-14: qty 2

## 2014-10-14 MED ORDER — HYDROMORPHONE HCL 1 MG/ML IJ SOLN
INTRAMUSCULAR | Status: AC
  Administered 2014-10-14: 0.25 mg via INTRAVENOUS
  Filled 2014-10-14: qty 1

## 2014-10-14 MED ORDER — NEOSTIGMINE METHYLSULFATE 10 MG/10ML IV SOLN
INTRAVENOUS | Status: DC | PRN
  Administered 2014-10-14: 4 mg via INTRAVENOUS

## 2014-10-14 MED ORDER — LACTATED RINGERS IV SOLN
INTRAVENOUS | Status: DC
  Administered 2014-10-14: 09:00:00 via INTRAVENOUS

## 2014-10-14 MED ORDER — MIDAZOLAM HCL 2 MG/2ML IJ SOLN
INTRAMUSCULAR | Status: AC
  Filled 2014-10-14: qty 2

## 2014-10-14 SURGICAL SUPPLY — 47 items
APL SKNCLS STERI-STRIP NONHPOA (GAUZE/BANDAGES/DRESSINGS) ×1
APPLIER CLIP 5 13 M/L LIGAMAX5 (MISCELLANEOUS) ×4
APR CLP MED LRG 5 ANG JAW (MISCELLANEOUS) ×2
BAG SPEC RTRVL LRG 6X4 10 (ENDOMECHANICALS) ×1
BENZOIN TINCTURE PRP APPL 2/3 (GAUZE/BANDAGES/DRESSINGS) ×2 IMPLANT
CANISTER SUCTION 2500CC (MISCELLANEOUS) ×2 IMPLANT
CHLORAPREP W/TINT 26ML (MISCELLANEOUS) ×2 IMPLANT
CLIP APPLIE 5 13 M/L LIGAMAX5 (MISCELLANEOUS) ×1 IMPLANT
COVER MAYO STAND STRL (DRAPES) ×2 IMPLANT
COVER SURGICAL LIGHT HANDLE (MISCELLANEOUS) ×2 IMPLANT
DECANTER SPIKE VIAL GLASS SM (MISCELLANEOUS) ×2 IMPLANT
DRAPE C-ARM 42X72 X-RAY (DRAPES) ×2 IMPLANT
DRAPE LAPAROSCOPIC ABDOMINAL (DRAPES) ×2 IMPLANT
DRSG TEGADERM 2-3/8X2-3/4 SM (GAUZE/BANDAGES/DRESSINGS) ×8 IMPLANT
ELECT REM PT RETURN 9FT ADLT (ELECTROSURGICAL) ×2
ELECTRODE REM PT RTRN 9FT ADLT (ELECTROSURGICAL) ×1 IMPLANT
GAUZE SPONGE 2X2 8PLY STRL LF (GAUZE/BANDAGES/DRESSINGS) ×1 IMPLANT
GLOVE BIO SURGEON STRL SZ8 (GLOVE) ×1 IMPLANT
GLOVE BIOGEL PI IND STRL 7.0 (GLOVE) IMPLANT
GLOVE BIOGEL PI IND STRL 8 (GLOVE) ×1 IMPLANT
GLOVE BIOGEL PI INDICATOR 7.0 (GLOVE) ×1
GLOVE BIOGEL PI INDICATOR 8 (GLOVE) ×2
GLOVE ECLIPSE 6.5 STRL STRAW (GLOVE) ×1 IMPLANT
GLOVE ECLIPSE 8.0 STRL XLNG CF (GLOVE) ×2 IMPLANT
GLOVE SURG SS PI 7.5 STRL IVOR (GLOVE) ×1 IMPLANT
GOWN STRL REUS W/ TWL LRG LVL3 (GOWN DISPOSABLE) ×3 IMPLANT
GOWN STRL REUS W/TWL LRG LVL3 (GOWN DISPOSABLE) ×6
KIT BASIN OR (CUSTOM PROCEDURE TRAY) ×2 IMPLANT
KIT ROOM TURNOVER OR (KITS) ×2 IMPLANT
NS IRRIG 1000ML POUR BTL (IV SOLUTION) ×2 IMPLANT
PAD ARMBOARD 7.5X6 YLW CONV (MISCELLANEOUS) ×2 IMPLANT
POUCH SPECIMEN RETRIEVAL 10MM (ENDOMECHANICALS) ×2 IMPLANT
SCISSORS LAP 5X35 DISP (ENDOMECHANICALS) ×2 IMPLANT
SET CHOLANGIOGRAPH 5 50 .035 (SET/KITS/TRAYS/PACK) ×2 IMPLANT
SET IRRIG TUBING LAPAROSCOPIC (IRRIGATION / IRRIGATOR) ×2 IMPLANT
SLEEVE ENDOPATH XCEL 5M (ENDOMECHANICALS) ×4 IMPLANT
SPECIMEN JAR SMALL (MISCELLANEOUS) ×2 IMPLANT
SPONGE GAUZE 2X2 STER 10/PKG (GAUZE/BANDAGES/DRESSINGS) ×1
STRIP CLOSURE SKIN 1/2X4 (GAUZE/BANDAGES/DRESSINGS) ×1 IMPLANT
SUT MON AB 4-0 PC3 18 (SUTURE) ×2 IMPLANT
TOWEL OR 17X24 6PK STRL BLUE (TOWEL DISPOSABLE) ×1 IMPLANT
TOWEL OR 17X26 10 PK STRL BLUE (TOWEL DISPOSABLE) ×2 IMPLANT
TRAY LAPAROSCOPIC (CUSTOM PROCEDURE TRAY) ×2 IMPLANT
TROCAR XCEL BLUNT TIP 100MML (ENDOMECHANICALS) ×2 IMPLANT
TROCAR XCEL NON-BLD 11X100MML (ENDOMECHANICALS) IMPLANT
TROCAR XCEL NON-BLD 5MMX100MML (ENDOMECHANICALS) ×2 IMPLANT
TUBING INSUFFLATION (TUBING) ×2 IMPLANT

## 2014-10-14 NOTE — Discharge Instructions (Signed)
[facility] ______[facility] LAPAROSCOPIC GALLBALDDER SURGERY: POST OP INSTRUCTIONS Always review your discharge instruction sheet given to you by the facility where your surgery was performed. IF YOU HAVE DISABILITY OR FAMILY LEAVE FORMS, YOU MUST BRING THEM TO THE OFFICE FOR PROCESSING.   DO NOT GIVE THEM TO YOUR DOCTOR.  1. A prescription for pain medication may be given to you upon discharge.  Take your pain medication as prescribed, if needed.  If narcotic pain medicine is not needed, then you may take acetaminophen (Tylenol) or ibuprofen (Advil) as needed. 2. Take your usually prescribed medications unless otherwise directed. 3. If you need a refill on your pain medication, please contact your pharmacy.  They will contact our office to request authorization. Prescriptions will not be filled after 5pm or on week-ends. 4. You should follow a light diet the first few days after arrival home, such as soup and crackers, etc.  Be sure to include lots of fluids daily. 5. Most patients will experience some swelling and bruising in the area of the incisions.  Ice packs will help.  Swelling and bruising can take several days to resolve.  6. It is common to experience some constipation if taking pain medication after surgery.  Increasing fluid intake and taking a stool softener (such as Colace) will usually help or prevent this problem from occurring.  A mild laxative (Milk of Magnesia or Miralax) should be taken according to package instructions if there are no bowel movements after 48 hours. 7. Unless discharge instructions indicate otherwise, you may remove your bandages 72 hours after surgery, and you may shower at that time.  You may have steri-strips (small skin tapes) in place directly over the incision.  These strips should be left on the skin for 14 days.  If your surgeon used skin glue on the incision, you may shower in 24 hours.  The glue will flake off over the next 2-3 weeks.  Any  sutures or staples will be removed at the office during your follow-up visit. 8. ACTIVITIES:  You may resume regular (light) daily activities beginning the next day--such as daily self-care, walking, climbing stairs--gradually increasing activities as tolerated.  You may have sexual intercourse when it is comfortable.  Refrain from any heavy lifting or straining-nothing over 10 pounds for 2 weeks.  a. You may drive when you are no longer taking prescription pain medication, you can comfortably wear a seatbelt, and you can safely maneuver your car and apply brakes. b. RETURN TO WORK:  Desk work/light work in one week, full duty in 2 weeks if pain-free.__________________________________________________________ 9. You should see your doctor in the office for a follow-up appointment approximately 2-3 weeks after your surgery.  Make sure that you call for this appointment within a day or two after you arrive home to insure a convenient appointment time. 10. OTHER INSTRUCTIONS: __________________________________________________________________________________________________________________________ __________________________________________________________________________________________________________________________ WHEN TO CALL YOUR DOCTOR: 1. Fever over 101.0 2. Inability to urinate 3. Continued bleeding from incision. 4. Increased pain, redness, or drainage from the incision. 5. Increasing abdominal pain  The clinic staff is available to answer your questions during regular business hours.  Please dont hesitate to call and ask to speak to one of the nurses for clinical concerns.  If you have a medical emergency, go to the nearest emergency room or call 911.  A surgeon from [name] is always on call at the hospital. [address] [place], [place], [place]  [address] ? [id number],  [place], [place]   [address] [phone] ? [phone] ? FAX [phone] Web site:  [email or web address]

## 2014-10-14 NOTE — Anesthesia Procedure Notes (Signed)
Procedure Name: Intubation Performed by: Rogers Blocker Pre-anesthesia Checklist: Patient identified, Emergency Drugs available, Suction available, Patient being monitored and Timeout performed Patient Re-evaluated:Patient Re-evaluated prior to inductionOxygen Delivery Method: Circle system utilized Preoxygenation: Pre-oxygenation with 100% oxygen Intubation Type: IV induction Ventilation: Mask ventilation without difficulty Laryngoscope Size: Mac and 3 Grade View: Grade III Tube type: Oral Tube size: 7.5 mm Number of attempts: 2 Airway Equipment and Method: Bougie stylet and LTA kit utilized Placement Confirmation: positive ETCO2,  CO2 detector,  breath sounds checked- equal and bilateral and ETT inserted through vocal cords under direct vision Secured at: 22 cm Tube secured with: Tape Dental Injury: Teeth and Oropharynx as per pre-operative assessment  Difficulty Due To: Difficult Airway- due to anterior larynx Comments: Attempt x2 by CRNA, Gr 3 view with Crystall Heck 2 and Miller 3.  ETT placed by Dr. Tamala Julian with Mac 3 and Bougie stylet.

## 2014-10-14 NOTE — Anesthesia Postprocedure Evaluation (Signed)
  Anesthesia Post-op Note  Patient: [name]  Procedure(s) Performed: Procedure(s): LAPAROSCOPIC CHOLECYSTECTOMY WITH INTRAOPERATIVE CHOLANGIOGRAM (N/A)  Patient Location: PACU  Anesthesia Type:General  Level of Consciousness: awake, alert , oriented and patient cooperative  Airway and Oxygen Therapy: Patient Spontanous Breathing  Post-op Pain: mild  Post-op Assessment: Post-op Vital signs reviewed, Patient's Cardiovascular Status Stable, Respiratory Function Stable, Patent Airway, No signs of Nausea or vomiting and Pain level controlled  Post-op Vital Signs: stable  Last Vitals:  Filed Vitals:   10/14/14 1155  BP: 102/67  Pulse: 68  Temp:   Resp: 14    Complications: No apparent anesthesia complications

## 2014-10-14 NOTE — Anesthesia Preprocedure Evaluation (Addendum)
Anesthesia Evaluation  Patient identified by MRN, date of birth, ID band Patient awake    Reviewed: NPO status , Patient's Chart, lab work & pertinent test results  Airway Mallampati: III  TM Distance: <3 FB   Mouth opening: Limited Mouth Opening  Dental  (+) Teeth Intact, Dental Advisory Given   Pulmonary sleep apnea , former smoker,    Pulmonary exam normal       Cardiovascular hypertension, Pt. on medications Rhythm:Regular Rate:Normal     Neuro/Psych Anxiety Depression Bipolar Disorder CVA, Residual Symptoms    GI/Hepatic GERD-  ,  Endo/Other    Renal/GU      Musculoskeletal   Abdominal   Peds  Hematology   Anesthesia Other Findings   Reproductive/Obstetrics                         Anesthesia Physical Anesthesia Plan  ASA: II  Anesthesia Plan: General   Post-op Pain Management:    Induction: Intravenous  Airway Management Planned: Oral ETT  Additional Equipment:   Intra-op Plan:   Post-operative Plan: Extubation in OR  Informed Consent: I have reviewed the patients History and Physical, chart, labs and discussed the procedure including the risks, benefits and alternatives for the proposed anesthesia with the patient or authorized representative who has indicated his/her understanding and acceptance.   Dental advisory given  Plan Discussed with: CRNA, Anesthesiologist and Surgeon  Anesthesia Plan Comments:        Anesthesia Quick Evaluation

## 2014-10-14 NOTE — Transfer of Care (Signed)
Immediate Anesthesia Transfer of Care Note  Patient: [name]  Procedure(s) Performed: Procedure(s): LAPAROSCOPIC CHOLECYSTECTOMY WITH INTRAOPERATIVE CHOLANGIOGRAM (N/A)  Patient Location: PACU  Anesthesia Type:General  Level of Consciousness: awake, alert , oriented and patient cooperative  Airway & Oxygen Therapy: Patient Spontanous Breathing and Patient connected to face mask oxygen  Post-op Assessment: Report given to RN, Post -op Vital signs reviewed and stable and Patient moving all extremities X 4  Post vital signs: Reviewed and stable  Last Vitals:  Filed Vitals:   10/14/14 0827  BP: 137/79  Pulse: 64  Temp: 36.6 C  Resp: 20    Complications: No apparent anesthesia complications

## 2014-10-14 NOTE — Interval H&P Note (Signed)
History and Physical Interval Note:  10/14/2014 9:03 AM  [name]  has presented today for surgery, with the diagnosis of chronic cholecystitis  The various methods of treatment have been discussed with the patient and family. After consideration of risks, benefits and other options for treatment, the patient has consented to  Procedure(s): LAPAROSCOPIC CHOLECYSTECTOMY WITH INTRAOPERATIVE CHOLANGIOGRAM (N/A) as a surgical intervention .  The patient's history has been reviewed, patient examined, no change in status, stable for surgery.  I have reviewed the patient's chart and labs.  Questions were answered to the patient's satisfaction.     Carin Shipp [name]

## 2014-10-14 NOTE — H&P (Signed)
[name] is an 54 y.o. female.   Chief Complaint: Here for elective surgery HPI: She has had dull right upper quadrant pain that sometimes radiates over the left upper quadrant and sometimes radiates to the back. It does not seem to be associated with any food. It does seem to be made worse when she is nervous or has some anxiety. Codeine helps with the pain but Tylenol does not. An abdominal ultrasound demonstrated a normal gallbladder and normal diameter common bile duct. A nuclear medicine hepatobiliary scan demonstrated a normal gallbladder ejection fraction 73%. She did have reproduction of her symptoms during injection of the CCK.  There is no family history of gallbladder disease. Upper endoscopy did not reveal any pathology that would explain her symptoms. CT scan did not demonstrate any lesions to explain her symptoms.  She has decided to proceed with elective cholecystectomy.  We have discussed the possibility that she may still have the pain after the surgery.  Past Medical History  Diagnosis Date  . Hypertension     Mild; provoked by stress and anxiety  . Intracerebral bleed     No aneurysm; followed by Dr. Sherwood Gambler  . Hyperlipidemia   . GERD (gastroesophageal reflux disease)   . Depression   . Anxiety   . Chest pain 09/2011    Cardiac cath-normal coronaries  . Sleep apnea     Stop Bang score of 4  . Bipolar disorder   . Arthritis   . Carpal tunnel syndrome     Bilateral  . Constipation   . Hyperlipemia   . Stroke 1999    hemorrhagic stroke; weakness of left side    Past Surgical History  Procedure Laterality Date  . Abdominal hysterectomy    . Cervical fusion    . Rectocele repair      x2  . Cardiac catheterization    . Lipoma excision Left 11/18/2013    Procedure: EXCISION OF SOFT TISSUE MASS-LEFT THIGH;  Surgeon: Jamesetta So, MD;  Location: [facility];  Service: General;  Laterality: Left;  . Left heart catheterization with coronary angiogram N/A  09/23/2011    Procedure: LEFT HEART CATHETERIZATION WITH CORONARY ANGIOGRAM;  Surgeon: Thayer Headings, MD;  Location: [facility] CATH LAB;  Service: Cardiovascular;  Laterality: N/A;  . Brain surgery  1999    to remove blood clot after stroke     Family History  Problem Relation Age of Onset  . Coronary artery disease Paternal Grandfather   . Coronary artery disease Paternal Uncle   . Cancer Mother     breast   . Hypertension Mother   . Hyperlipidemia Mother   . Depression Mother   . Anxiety disorder Mother   . Drug abuse Sister   . Depression Cousin   . Drug abuse Cousin    Social History:  reports that she quit smoking about 17 years ago. Her smoking use included Cigarettes. She has a 19 pack-year smoking history. She has never used smokeless tobacco. She reports that she drinks alcohol. She reports that she does not use illicit drugs.  Allergies:  Allergies  Allergen Reactions  . Morphine And Related Hives  . Promethazine Hcl     Causes patient to become Hyper    Medications Prior to Admission  Medication Sig Dispense Refill  . ALPRAZolam (XANAX) 1 MG tablet TAKE (1) TABLET BY MOUTH TWICE A DAY AS NEEDED FOR ANXIETY/SLEEP. 60 tablet 2  . Ascorbic Acid (VITAMIN C) 1000 MG tablet Take  1,000 mg by mouth daily.    . B Complex-C (SUPER B COMPLEX PO) Take 1 tablet by mouth daily.     Marland Kitchen BLACK COHOSH PO Take 1 tablet by mouth daily.     . cyclobenzaprine (FLEXERIL) 10 MG tablet TAKE 1 TABLET BY MOUTH THREE TIMES DAILY AS NEEDED FOR MUSCLE SPASMS. 45 tablet 3  . dicyclomine (BENTYL) 20 MG tablet Take 20 mg by mouth 4 (four) times daily.    . DULoxetine (CYMBALTA) 60 MG capsule Take 1 capsule (60 mg total) by mouth daily. 30 capsule 2  . Evening Primrose Oil CAPS Take 1 capsule by mouth daily.    . hydrochlorothiazide (HYDRODIURIL) 25 MG tablet TAKE ONE TABLET BY MOUTH DAILY. 30 tablet 3  . methylphenidate (RITALIN) 20 MG tablet Take 1 tablet (20 mg total) by mouth 2 (two) times daily with  breakfast and lunch. 60 tablet 0  . Multiple Vitamin (MULITIVITAMIN WITH MINERALS) TABS Take 1 tablet by mouth daily.    Marland Kitchen nystatin (MYCOSTATIN) powder APPLY FOUR TIMES DAILY AS NEEDED. 60 g 0  . OVER THE COUNTER MEDICATION Take 1 tablet by mouth daily. OVER THE COUNTER ALLERGY MEDICATION    . oxyCODONE-acetaminophen (PERCOCET/ROXICET) 5-325 MG per tablet Take 1 tablet by mouth every 6 (six) hours as needed. (Patient taking differently: Take 1 tablet by mouth every 6 (six) hours as needed (for pain). ) 30 tablet 0  . pantoprazole (PROTONIX) 40 MG tablet Take 1 tablet (40 mg total) by mouth 2 (two) times daily. 60 tablet 5  . polyethylene glycol powder (GLYCOLAX/MIRALAX) powder Take 17 g by mouth daily. 3350 g 6  . POTASSIUM PO Take 100 mg by mouth daily.     . traZODone (DESYREL) 50 MG tablet Take 2 tablets (100 mg total) by mouth at bedtime. 60 tablet 3  . venlafaxine XR (EFFEXOR-XR) 75 MG 24 hr capsule Take 75 mg by mouth daily.    Marland Kitchen acetaminophen (TYLENOL) 500 MG tablet Take 500 mg by mouth every 6 (six) hours as needed.    . simvastatin (ZOCOR) 20 MG tablet Take 1 tablet (20 mg total) by mouth daily. (Patient not taking: Reported on 10/02/2014) 30 tablet 6    No results found for this or any previous visit (from the past 48 hour(s)). No results found.  Review of Systems  Constitutional: Negative for fever and chills.  Gastrointestinal: Positive for abdominal pain and constipation.    Blood pressure 137/79, pulse 64, temperature 97.8 F (36.6 C), temperature source Oral, resp. rate 20, weight 63.504 kg (140 lb), SpO2 100 %. Physical Exam  Constitutional: She appears well-developed and well-nourished. No distress.  Cardiovascular: Normal rate and regular rhythm.   Respiratory: Effort normal and breath sounds normal.  GI: Soft. There is no tenderness.  Neurological: She is alert.  Skin: Skin is warm and dry.  Psychiatric: She has a normal mood and affect. Her behavior is normal.      Assessment/Plan RUQ pain with biliary colic type symptoms and concern for Chronic Cholecystitis  Plan:  Laparoscopic cholecystectomy with IOC.  Damichael Hofman J 10/14/2014, 9:00 AM

## 2014-10-14 NOTE — Op Note (Signed)
Preoperative diagnosis:  Chronic cholecystitis   Postoperative diagnosis:  Same  Procedure: Laparoscopic cholecystectomy with cholangiogram.  Surgeon: Jackolyn Confer, M.D.  Asst.:  Verita Lamb, MD  Anesthesia: General  Indication:   This is a 54 year old female with biliary colic type symptoms but normal gallbladder studies, an unremarkable EGD and a CT scan that did not explain her symptoms.  She now presents for the above procedure.  Technique: She was brought to the operating room, placed supine on the operating table, and a general anesthetic was administered. The hair on the abdominal wall was clipped as was necessary. The abdominal wall was then sterilely prepped and draped. Local anesthetic (Marcaine) was infiltrated in the subumbilical region. A small subumbilical incision was made through the skin, subcutaneous tissue, fascia, and peritoneum, at the site of a previous scar, entering the peritoneal cavity under direct vision. A pursestring suture of 0 Vicryl was placed around the edges of the fascia. A Hassan trocar was introduced into the peritoneal cavity and a pneumoperitoneum was created by insufflation of carbon dioxide gas. The laparoscope was introduced into the trocar and no underlying bleeding or organ injury was noted. She was then placed in the reverse Trendelenburg position with the right side tilted slightly up.  Three 5 mm trocars were then placed into the abdominal cavity under laparoscopic vision. One in the epigastric area, and 2 in the right upper quadrant area. The gallbladder was visualized and the fundus was grasped and retracted toward the right shoulder. Adhesions were noted to the infundibulum which were mobilized bluntly close to the gallbladder. The infundibulum was mobilized with dissection close to the gallbladder and retracted laterally. The cystic duct was identified and a window was created around it. The anterior branch of the cystic artery was also identified  and a window was created around it. The critical view was achieved. A clip was placed at the neck of the gallbladder. A small incision was made in the cystic duct. A cholangiocatheter was introduced through the anterior abdominal wall and placed in the cystic duct. A intraoperative cholangiogram was then performed.  Under real-time fluoroscopy, dilute contrast was injected into the cystic duct.  The common hepatic duct, the right and left hepatic ducts, and the common duct were all visualized. Contrast drained into the duodenum without obvious evidence of any obstructing ductal lesion. The final report is pending the Radiologist's interpretation.  The cholangiocatheter was removed, the cystic duct was clipped 3 times on the biliary side, and then the cystic duct was divided sharply. No bile leak was noted from the cystic duct stump.  The anterior branch of the cystic artery was then clipped and divided. A posterior branch of the cystic artery was identified, clipped and divided.  Following this the gallbladder was dissected free from the liver using electrocautery. The gallbladder was then placed in a retrieval bag and removed from the abdominal cavity through the subumbilical incision.  The gallbladder fossa was inspected, irrigated, and bleeding was controlled with electrocautery. Inspection showed that hemostasis was adequate and there was no evidence of bile leak.  The irrigation fluid was evacuated as much as possible.  The subumbilical trocar was removed and the fascial defect was closed by tightening and tying down the pursestring suture under laparoscopic vision.  The remaining trocars were removed and the pneumoperitoneum was released. The skin incisions were closed with 4-0 Monocryl subcuticular stitches. Steri-Strips and sterile dressings were applied.  The procedure was well-tolerated without any apparent complications. She  was taken to the recovery room in satisfactory condition.

## 2014-10-16 ENCOUNTER — Encounter (HOSPITAL_COMMUNITY): Payer: Self-pay | Admitting: General Surgery

## 2014-10-16 ENCOUNTER — Telehealth: Payer: Self-pay | Admitting: Family Medicine

## 2014-10-16 NOTE — Telephone Encounter (Signed)
[phone]  PT just had surgery and that place is referring her somewhere else and she would like to speak to you about this.

## 2014-10-17 NOTE — Telephone Encounter (Signed)
Need OV to discuss

## 2014-10-17 NOTE — Telephone Encounter (Signed)
Called pt back and she stated that she is wanting to go to a neurologist at [facility] Dr. Floyde Parkins. Pt states that the Doctor that performed her gallbladder surgery had suggested she see a neurologist for coordination issues, h/o stroke, fall risk and pt states that has she gets older her symptoms are getting worse. Please advise?  ?ok to place order.

## 2014-10-17 NOTE — Telephone Encounter (Signed)
lmtrc

## 2014-10-21 NOTE — Telephone Encounter (Signed)
lmtrc

## 2014-10-22 NOTE — Telephone Encounter (Signed)
Pt called back and aware needs to have ov visit per provider to discuss referral to neuro, pt is scheduled for Friday 22nd

## 2014-10-24 ENCOUNTER — Ambulatory Visit: Payer: Self-pay | Admitting: Family Medicine

## 2014-10-30 ENCOUNTER — Telehealth: Payer: Self-pay | Admitting: *Deleted

## 2014-10-30 NOTE — Telephone Encounter (Signed)
noted 

## 2014-10-30 NOTE — Telephone Encounter (Signed)
Received patient assistance Effexor.   Call placed to patient to make aware that prescription is available for pick up.   Patient states that Dr. Harrington Challenger has D/Ced Effexor and began her on Cymbalta.   Reports that she would prefer to continue Effexor. She has upcoming appointment with Dr. Harrington Challenger on 10/31/2014. Advised to discuss medication with MD at that time.   Will notify MD.

## 2014-10-31 ENCOUNTER — Encounter (HOSPITAL_COMMUNITY): Payer: Self-pay | Admitting: Psychiatry

## 2014-10-31 ENCOUNTER — Ambulatory Visit (INDEPENDENT_AMBULATORY_CARE_PROVIDER_SITE_OTHER): Payer: 59 | Admitting: Psychiatry

## 2014-10-31 VITALS — BP 131/88 | HR 95 | Ht 63.0 in | Wt 135.6 lb

## 2014-10-31 DIAGNOSIS — F332 Major depressive disorder, recurrent severe without psychotic features: Secondary | ICD-10-CM | POA: Diagnosis not present

## 2014-10-31 DIAGNOSIS — F909 Attention-deficit hyperactivity disorder, unspecified type: Secondary | ICD-10-CM

## 2014-10-31 DIAGNOSIS — F411 Generalized anxiety disorder: Secondary | ICD-10-CM

## 2014-10-31 MED ORDER — TRAZODONE HCL 50 MG PO TABS: 100.0000 mg | ORAL_TABLET | Freq: Every day | ORAL | Status: DC

## 2014-10-31 MED ORDER — DEXTROMETHORPHAN-QUINIDINE 20-10 MG PO CAPS: 20.0000 mg | ORAL_CAPSULE | Freq: Two times a day (BID) | ORAL | Status: DC

## 2014-10-31 MED ORDER — METHYLPHENIDATE HCL 20 MG PO TABS: 20.0000 mg | ORAL_TABLET | Freq: Two times a day (BID) | ORAL | Status: DC

## 2014-10-31 NOTE — Patient Instructions (Signed)
Stop cymbalta. Start nuedexta one pill daily for one week, then go to one pill twice a day
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
## 2014-11-04 ENCOUNTER — Telehealth (HOSPITAL_COMMUNITY): Payer: Self-pay | Admitting: *Deleted

## 2014-11-04 ENCOUNTER — Ambulatory Visit (HOSPITAL_COMMUNITY): Payer: Self-pay | Admitting: Psychiatry

## 2014-11-04 NOTE — Telephone Encounter (Signed)
voice message from patient, she can't afford to see both Dr. Harrington Challenger and Vickii Chafe.   Shaw Peggy's appointment.

## 2014-11-05 ENCOUNTER — Telehealth (HOSPITAL_COMMUNITY): Payer: Self-pay | Admitting: *Deleted

## 2014-11-05 ENCOUNTER — Other Ambulatory Visit: Payer: Self-pay | Admitting: Obstetrics and Gynecology

## 2014-11-05 NOTE — Telephone Encounter (Signed)
Pt called stating that the number we gived her for Financial Assistant informed her to call office and get a [facility] Form because we should have one. Pt later stated after taking her Nuedexta, she became very dizzy and she feels like the room starts to spine even sitting watching t/v. Per pt she watches her B/P and that's fine. Pt number is [phone].

## 2014-11-05 NOTE — Telephone Encounter (Signed)
Informed pt of what Dr. Harrington Challenger stated and she showed understanding

## 2014-11-05 NOTE — Telephone Encounter (Signed)
Pt called stating that the number we gived her for Financial Assistant informed her to call office and get a [facility] Form because we should have one. Pt later stated after taking her Nuedexta, she became very dizzy and she feels like the room starts to spine even sitting watching t/v. Per pt she watches her B/P and that's fine. Pt number is [phone].

## 2014-11-05 NOTE — Telephone Encounter (Signed)
Tell her to stop nuedexta

## 2014-11-13 ENCOUNTER — Other Ambulatory Visit: Payer: Self-pay | Admitting: Obstetrics and Gynecology

## 2014-11-27 ENCOUNTER — Encounter (HOSPITAL_COMMUNITY): Payer: Self-pay | Admitting: Psychiatry

## 2014-11-27 ENCOUNTER — Other Ambulatory Visit: Payer: Self-pay | Admitting: Family Medicine

## 2014-11-27 ENCOUNTER — Ambulatory Visit (INDEPENDENT_AMBULATORY_CARE_PROVIDER_SITE_OTHER): Payer: 59 | Admitting: Psychiatry

## 2014-11-27 VITALS — BP 131/86 | HR 90 | Ht 63.0 in | Wt 132.0 lb

## 2014-11-27 DIAGNOSIS — F900 Attention-deficit hyperactivity disorder, predominantly inattentive type: Secondary | ICD-10-CM

## 2014-11-27 DIAGNOSIS — F411 Generalized anxiety disorder: Secondary | ICD-10-CM

## 2014-11-27 DIAGNOSIS — F332 Major depressive disorder, recurrent severe without psychotic features: Secondary | ICD-10-CM | POA: Diagnosis not present

## 2014-11-27 MED ORDER — FLUOXETINE HCL 20 MG PO CAPS: 20.0000 mg | ORAL_CAPSULE | Freq: Every day | ORAL | Status: DC

## 2014-11-27 MED ORDER — ALPRAZOLAM 1 MG PO TABS: 1.0000 mg | ORAL_TABLET | Freq: Two times a day (BID) | ORAL | Status: DC

## 2014-11-27 MED ORDER — METHYLPHENIDATE HCL 20 MG PO TABS: 20.0000 mg | ORAL_TABLET | Freq: Two times a day (BID) | ORAL | Status: DC
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
## 2014-11-27 NOTE — Telephone Encounter (Signed)
Ok to refill??  Last office visit 05/20/2014.  Is this prescribed by [facility]?

## 2014-11-28 NOTE — Telephone Encounter (Signed)
Dr. Harrington Challenger has handled request.

## 2014-11-28 NOTE — Telephone Encounter (Signed)
What medicine is this in reference too, Dr. Harrington Challenger has filled her controlled meds

## 2014-12-09 ENCOUNTER — Other Ambulatory Visit: Payer: Self-pay | Admitting: Family Medicine

## 2014-12-09 NOTE — Telephone Encounter (Signed)
Medication filled x1 with no refills.   Requires office visit before any further refills can be given.   Letter sent.  

## 2014-12-11 ENCOUNTER — Ambulatory Visit (INDEPENDENT_AMBULATORY_CARE_PROVIDER_SITE_OTHER): Payer: 59 | Admitting: Obstetrics and Gynecology

## 2014-12-11 ENCOUNTER — Encounter: Payer: Self-pay | Admitting: Obstetrics and Gynecology

## 2014-12-11 VITALS — BP 144/90 | HR 76 | Ht 63.0 in | Wt 129.6 lb

## 2014-12-11 DIAGNOSIS — N941 Unspecified dyspareunia: Secondary | ICD-10-CM | POA: Insufficient documentation

## 2014-12-11 DIAGNOSIS — N816 Rectocele: Secondary | ICD-10-CM | POA: Diagnosis not present

## 2014-12-11 DIAGNOSIS — N930 Postcoital and contact bleeding: Secondary | ICD-10-CM

## 2014-12-11 DIAGNOSIS — Z90710 Acquired absence of both cervix and uterus: Secondary | ICD-10-CM | POA: Diagnosis not present

## 2014-12-11 DIAGNOSIS — IMO0002 Reserved for concepts with insufficient information to code with codable children: Secondary | ICD-10-CM

## 2014-12-11 NOTE — Progress Notes (Signed)
Patient ID: [name], female   DOB: 1961/01/25, 54 y.o.   MRN: [id number]   [facility] Visit  Patient name: [name] MRN [id number]  Date of birth: 1960-09-01  CC & HPI:  BRITNI DRISCOLL is a 54 y.o. female presenting today for no desire for sex, bleeding with intercourse, rectocele, and a sensation that there is a "thick rubber band" in the vagina. Patient hasn't had sex in over 1 year due to pain and bleeding. Patient has a history of recurrent chronic constipation requiring disimpaction. Patient has taken Miralax once a day with only some relief.  ROS:  A complete 10 system review of systems was obtained and all systems are negative except as noted in the HPI and PMH.   + chronic irritation underneath the breast  Pertinent History Reviewed:   Reviewed: Significant for rectocele repair and abdominal hysterectomy  Medical         Past Medical History  Diagnosis Date  . Hypertension     Mild; provoked by stress and anxiety  . Intracerebral bleed     No aneurysm; followed by Dr. Sherwood Gambler  . Hyperlipidemia   . GERD (gastroesophageal reflux disease)   . Depression   . Anxiety   . Chest pain 09/2011    Cardiac cath-normal coronaries  . Sleep apnea     Stop Bang score of 4  . Bipolar disorder   . Arthritis   . Carpal tunnel syndrome     Bilateral  . Constipation   . Hyperlipemia   . Stroke 1999    hemorrhagic stroke; weakness of left side                              Surgical Hx:    Past Surgical History  Procedure Laterality Date  . Abdominal hysterectomy    . Cervical fusion    . Rectocele repair      x2  . Cardiac catheterization    . Lipoma excision Left 11/18/2013    Procedure: EXCISION OF SOFT TISSUE MASS-LEFT THIGH;  Surgeon: Jamesetta So, MD;  Location: [facility];  Service: General;  Laterality: Left;  . Left heart catheterization with coronary angiogram N/A 09/23/2011    Procedure: LEFT HEART CATHETERIZATION WITH CORONARY ANGIOGRAM;  Surgeon:  Thayer Headings, MD;  Location: [facility] CATH LAB;  Service: Cardiovascular;  Laterality: N/A;  . Brain surgery  1999    to remove blood clot after stroke   . Cholecystectomy N/A 10/14/2014    Procedure: LAPAROSCOPIC CHOLECYSTECTOMY WITH INTRAOPERATIVE CHOLANGIOGRAM;  Surgeon: Jackolyn Confer, MD;  Location: [facility];  Service: General;  Laterality: N/A;   Medications: Reviewed & Updated - see associated section                       Current outpatient prescriptions:  .  acetaminophen (TYLENOL) 500 MG tablet, Take 500 mg by mouth every 6 (six) hours as needed., Disp: , Rfl:  .  ALPRAZolam (XANAX) 1 MG tablet, Take 1 tablet (1 mg total) by mouth 2 (two) times daily., Disp: 60 tablet, Rfl: 2 .  B Complex-C (SUPER B COMPLEX PO), Take 1 tablet by mouth daily. , Disp: , Rfl:  .  BLACK COHOSH PO, Take 1 tablet by mouth daily. , Disp: , Rfl:  .  cyclobenzaprine (FLEXERIL) 10 MG tablet, TAKE 1 TABLET BY MOUTH THREE TIMES DAILY AS NEEDED FOR MUSCLE  SPASMS., Disp: 45 tablet, Rfl: 3 .  dicyclomine (BENTYL) 20 MG tablet, Take 20 mg by mouth 4 (four) times daily., Disp: , Rfl:  .  Evening Primrose Oil CAPS, Take 1 capsule by mouth daily., Disp: , Rfl:  .  FLUoxetine (PROZAC) 20 MG capsule, Take 1 capsule (20 mg total) by mouth daily., Disp: 30 capsule, Rfl: 2 .  hydrochlorothiazide (HYDRODIURIL) 25 MG tablet, TAKE ONE TABLET BY MOUTH ONCE DAILY, Disp: 30 tablet, Rfl: 0 .  methylphenidate (RITALIN) 20 MG tablet, Take 1 tablet (20 mg total) by mouth 2 (two) times daily with breakfast and lunch., Disp: 60 tablet, Rfl: 0 .  Multiple Vitamin (MULITIVITAMIN WITH MINERALS) TABS, Take 1 tablet by mouth daily., Disp: , Rfl:  .  nystatin (MYCOSTATIN) powder, APPLY FOUR TIMES DAILY AS NEEDED., Disp: 60 g, Rfl: 0 .  OVER THE COUNTER MEDICATION, Take 1 tablet by mouth daily. OVER THE COUNTER ALLERGY MEDICATION, Disp: , Rfl:  .  oxyCODONE (OXY IR/ROXICODONE) 5 MG immediate release tablet, Take 1-2 tablets (5-10 mg total) by mouth  every 4 (four) hours as needed for moderate pain, severe pain or breakthrough pain., Disp: 40 tablet, Rfl: 0 .  pantoprazole (PROTONIX) 40 MG tablet, Take 1 tablet (40 mg total) by mouth 2 (two) times daily., Disp: 60 tablet, Rfl: 5 .  polyethylene glycol powder (GLYCOLAX/MIRALAX) powder, Take 17 g by mouth daily., Disp: 3350 g, Rfl: 6 .  POTASSIUM PO, Take 100 mg by mouth daily. , Disp: , Rfl:  .  traZODone (DESYREL) 50 MG tablet, Take 2 tablets (100 mg total) by mouth at bedtime., Disp: 60 tablet, Rfl: 3 .  Ascorbic Acid (VITAMIN C) 1000 MG tablet, Take 1,000 mg by mouth daily., Disp: , Rfl:  .  methylphenidate (RITALIN) 20 MG tablet, Take 1 tablet (20 mg total) by mouth 2 (two) times daily with breakfast and lunch. (Patient not taking: Reported on 12/11/2014), Disp: 60 tablet, Rfl: 0   Social History: Reviewed -  reports that she quit smoking about 17 years ago. Her smoking use included Cigarettes. She has a 19 pack-year smoking history. She has never used smokeless tobacco.  Objective Findings:  Vitals: Blood pressure 144/90, pulse 76, height 5' 3"  (1.6 m), weight 129 lb 9.6 oz (58.786 kg).  Physical Examination: General appearance - alert, well appearing, and in no distress, oriented to person, place, and time and normal appearing weight Mental status - alert, oriented to person, place, and time, normal mood, behavior, speech, dress, motor activity, and thought processes, affect appropriate to mood BREASTS: no masses or tenderness, moderate-to-marked laxity Pelvic - normal external genitalia  VULVA: normal appearing vulva with no masses, tenderness or lesions,  VAGINA: good support at the top of the vagina, smooth back wall UTERUS: surgically removed  RECTAL: recurrent rectocele pouch, guaiac negative, no masses  Assessment & Plan:   A:  1. Recurrent rectocele s/p repair x 2, with subsequent dyspareunia from vag tightness at mid  2. Chronic constipation.  P:  1. Increase Miralax to  twice daily.  2. If impaction reoccurs, considering redoing posterior repair. 3. Follow up prn. Pt considering repeat rectocele repair, AND release of mid-vaginal stricture, in December. Return  Mos if she wants surgery.  This chart was SCRIBED for Mallory Shirk, MD by Stephania Fragmin, ED Scribe. This patient was seen in room 3 and the patient's care was started at 3:44 PM.  I personally performed the services described in this documentation, which was SCRIBED in my  presence. The recorded information has been reviewed and considered accurate. It has been edited as necessary during review. Jonnie Kind, MD

## 2014-12-26 ENCOUNTER — Ambulatory Visit (HOSPITAL_COMMUNITY): Payer: Self-pay | Admitting: Psychiatry

## 2014-12-30 ENCOUNTER — Other Ambulatory Visit: Payer: Self-pay | Admitting: Family Medicine

## 2014-12-30 MED ORDER — PANTOPRAZOLE SODIUM 40 MG PO TBEC: 40.0000 mg | DELAYED_RELEASE_TABLET | Freq: Two times a day (BID) | ORAL | Status: DC

## 2014-12-30 NOTE — Telephone Encounter (Signed)
Medication refilled per protocol. 

## 2015-01-06 ENCOUNTER — Ambulatory Visit (INDEPENDENT_AMBULATORY_CARE_PROVIDER_SITE_OTHER): Payer: 59 | Admitting: Family Medicine

## 2015-01-06 ENCOUNTER — Telehealth: Payer: Self-pay | Admitting: *Deleted

## 2015-01-06 ENCOUNTER — Encounter: Payer: Self-pay | Admitting: Family Medicine

## 2015-01-06 VITALS — BP 128/68 | HR 72 | Temp 97.2°F | Resp 16 | Ht 63.0 in | Wt 128.0 lb

## 2015-01-06 DIAGNOSIS — F32A Depression, unspecified: Secondary | ICD-10-CM

## 2015-01-06 DIAGNOSIS — R109 Unspecified abdominal pain: Secondary | ICD-10-CM

## 2015-01-06 DIAGNOSIS — R634 Abnormal weight loss: Secondary | ICD-10-CM

## 2015-01-06 DIAGNOSIS — F329 Major depressive disorder, single episode, unspecified: Secondary | ICD-10-CM | POA: Diagnosis not present

## 2015-01-06 DIAGNOSIS — R413 Other amnesia: Secondary | ICD-10-CM | POA: Diagnosis not present

## 2015-01-06 DIAGNOSIS — I639 Cerebral infarction, unspecified: Secondary | ICD-10-CM | POA: Diagnosis not present

## 2015-01-06 DIAGNOSIS — R2681 Unsteadiness on feet: Secondary | ICD-10-CM | POA: Diagnosis not present

## 2015-01-06 DIAGNOSIS — I10 Essential (primary) hypertension: Secondary | ICD-10-CM

## 2015-01-06 DIAGNOSIS — R7309 Other abnormal glucose: Secondary | ICD-10-CM

## 2015-01-06 DIAGNOSIS — R739 Hyperglycemia, unspecified: Secondary | ICD-10-CM

## 2015-01-06 DIAGNOSIS — F419 Anxiety disorder, unspecified: Secondary | ICD-10-CM

## 2015-01-06 HISTORY — DX: Abnormal weight loss: R63.4

## 2015-01-06 LAB — CBC WITH DIFFERENTIAL/PLATELET
Basophils Absolute: 0 10*3/uL (ref 0.0–0.1)
Basophils Relative: 1 % (ref 0–1)
Eosinophils Absolute: 0.1 10*3/uL (ref 0.0–0.7)
Eosinophils Relative: 2 % (ref 0–5)
HCT: 38.1 % (ref 36.0–46.0)
Hemoglobin: 13.2 g/dL (ref 12.0–15.0)
Lymphocytes Relative: 41 % (ref 12–46)
Lymphs Abs: 1.6 10*3/uL (ref 0.7–4.0)
MCH: 32.5 pg (ref 26.0–34.0)
MCHC: 34.6 g/dL (ref 30.0–36.0)
MCV: 93.8 fL (ref 78.0–100.0)
MPV: 9 fL (ref 8.6–12.4)
Monocytes Absolute: 0.3 10*3/uL (ref 0.1–1.0)
Monocytes Relative: 8 % (ref 3–12)
Neutro Abs: 1.9 10*3/uL (ref 1.7–7.7)
Neutrophils Relative %: 48 % (ref 43–77)
Platelets: 276 10*3/uL (ref 150–400)
RBC: 4.06 MIL/uL (ref 3.87–5.11)
RDW: 13.3 % (ref 11.5–15.5)
WBC: 3.9 10*3/uL — ABNORMAL LOW (ref 4.0–10.5)

## 2015-01-06 LAB — LIPID PANEL
Cholesterol: 248 mg/dL — ABNORMAL HIGH (ref 0–200)
HDL: 57 mg/dL (ref >=46)
LDL Cholesterol: 153 mg/dL — ABNORMAL HIGH (ref 0–99)
Total CHOL/HDL Ratio: 4.4 Ratio
Triglycerides: 188 mg/dL — ABNORMAL HIGH (ref <150)
VLDL: 38 mg/dL (ref 0–40)

## 2015-01-06 LAB — COMPREHENSIVE METABOLIC PANEL
ALT: 17 U/L (ref 0–35)
AST: 13 U/L (ref 0–37)
Albumin: 4.5 g/dL (ref 3.5–5.2)
Alkaline Phosphatase: 49 U/L (ref 39–117)
BUN: 12 mg/dL (ref 6–23)
CO2: 32 mEq/L (ref 19–32)
Calcium: 9.5 mg/dL (ref 8.4–10.5)
Chloride: 102 mEq/L (ref 96–112)
Creat: 0.97 mg/dL (ref 0.50–1.10)
Glucose, Bld: 83 mg/dL (ref 70–99)
Potassium: 4.1 mEq/L (ref 3.5–5.3)
Sodium: 141 mEq/L (ref 135–145)
Total Bilirubin: 0.8 mg/dL (ref 0.2–1.2)
Total Protein: 6.5 g/dL (ref 6.0–8.3)

## 2015-01-06 LAB — URINALYSIS, MICROSCOPIC ONLY
Crystals: NONE SEEN
RBC / HPF: NONE SEEN RBC/hpf (ref <3)

## 2015-01-06 LAB — URINALYSIS, ROUTINE W REFLEX MICROSCOPIC
Bilirubin Urine: NEGATIVE
Glucose, UA: NEGATIVE mg/dL
Hgb urine dipstick: NEGATIVE
Ketones, ur: NEGATIVE mg/dL
Nitrite: NEGATIVE
Protein, ur: NEGATIVE mg/dL
Specific Gravity, Urine: 1.015 (ref 1.005–1.030)
Urobilinogen, UA: 0.2 mg/dL (ref 0.0–1.0)
pH: 7 (ref 5.0–8.0)

## 2015-01-06 LAB — HEMOGLOBIN A1C
Hgb A1c MFr Bld: 5.7 % — ABNORMAL HIGH (ref <5.7)
Mean Plasma Glucose: 117 mg/dL — ABNORMAL HIGH (ref <117)

## 2015-01-06 NOTE — Telephone Encounter (Signed)
Pt has appt scheduled on July 18 at 2pm with arrival at 1:45pm for MRI at [facility] radiology, left message to return call on both mobile and home

## 2015-01-06 NOTE — Assessment & Plan Note (Signed)
Weight loss as noted unclear if this was intentional or if this is due to her severe depression or the gallbladder problem she was having. I'm going to check her labs and see if there is anything else revealing.

## 2015-01-06 NOTE — Patient Instructions (Signed)
We will call with lab results  MRI of brain to be done  Continue current medications Schedule your psychiatry appointment  F/U 4 months

## 2015-01-06 NOTE — Assessment & Plan Note (Signed)
Recheck A1C 

## 2015-01-06 NOTE — Assessment & Plan Note (Signed)
His difficulty so her memory changes are due to her severe depression or she has some early onset cognitive decline in setting of her history of stroke. She is unable to go to a neurologist at this time especially with her gait problems though I did not note this today. I went to try to get an MRI of her brain to see if there is something else that were missing she will also have lab work today.
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
## 2015-01-06 NOTE — Assessment & Plan Note (Signed)
She has severe major depression as long as I've known her she is always talked about passive suicidal ideations but has never had any actions with them. Advised her to follow-up with her psychiatrist to have her reevaluated and possibly has some medication adjustments. She does not have any active suicidal plans today. I think that a lot of her neurological complaints are due to her severe depression. She is always from the financial strains at every visit.

## 2015-01-07 ENCOUNTER — Other Ambulatory Visit: Payer: Self-pay | Admitting: Family Medicine

## 2015-01-07 MED ORDER — SIMVASTATIN 20 MG PO TABS: 20.0000 mg | ORAL_TABLET | Freq: Every day | ORAL | Status: DC

## 2015-01-08 NOTE — Telephone Encounter (Signed)
Pt called back and aware of appt 

## 2015-01-08 NOTE — Telephone Encounter (Signed)
Tried calling again [email or web address]

## 2015-01-09 ENCOUNTER — Other Ambulatory Visit: Payer: Self-pay | Admitting: *Deleted

## 2015-01-09 LAB — URINE CULTURE: Colony Count: 80000

## 2015-01-09 MED ORDER — CIPROFLOXACIN HCL 500 MG PO TABS: 500.0000 mg | ORAL_TABLET | Freq: Two times a day (BID) | ORAL | Status: DC

## 2015-01-12 ENCOUNTER — Telehealth (HOSPITAL_COMMUNITY): Payer: Self-pay | Admitting: *Deleted

## 2015-01-12 NOTE — Telephone Encounter (Signed)
Phone call from patient, due to her insurance [facility]) she can no longer use [facility], but she can use [facility], [place].   She need new prescription for Ritalin.  she only have enough for tomorrow.

## 2015-01-13 ENCOUNTER — Telehealth (HOSPITAL_COMMUNITY): Payer: Self-pay | Admitting: *Deleted

## 2015-01-13 ENCOUNTER — Other Ambulatory Visit (HOSPITAL_COMMUNITY): Payer: Self-pay | Admitting: Psychiatry

## 2015-01-13 MED ORDER — METHYLPHENIDATE HCL 20 MG PO TABS: 20.0000 mg | ORAL_TABLET | Freq: Two times a day (BID) | ORAL | Status: DC

## 2015-01-13 NOTE — Telephone Encounter (Signed)
printed

## 2015-01-19 ENCOUNTER — Ambulatory Visit (HOSPITAL_COMMUNITY)
Admission: RE | Admit: 2015-01-19 | Discharge: 2015-01-19 | Disposition: A | Discharge status: Home / Self Care | Payer: 59 | Source: Ambulatory Visit | Attending: Family Medicine | Admitting: Family Medicine

## 2015-01-19 DIAGNOSIS — E785 Hyperlipidemia, unspecified: Secondary | ICD-10-CM | POA: Diagnosis not present

## 2015-01-19 DIAGNOSIS — R410 Disorientation, unspecified: Secondary | ICD-10-CM | POA: Diagnosis not present

## 2015-01-19 DIAGNOSIS — R413 Other amnesia: Secondary | ICD-10-CM

## 2015-01-19 DIAGNOSIS — R531 Weakness: Secondary | ICD-10-CM | POA: Diagnosis not present

## 2015-01-19 DIAGNOSIS — Z8673 Personal history of transient ischemic attack (TIA), and cerebral infarction without residual deficits: Secondary | ICD-10-CM | POA: Diagnosis not present

## 2015-01-19 DIAGNOSIS — I639 Cerebral infarction, unspecified: Secondary | ICD-10-CM

## 2015-01-19 DIAGNOSIS — G319 Degenerative disease of nervous system, unspecified: Secondary | ICD-10-CM | POA: Insufficient documentation

## 2015-01-19 DIAGNOSIS — R2681 Unsteadiness on feet: Secondary | ICD-10-CM

## 2015-01-21 ENCOUNTER — Other Ambulatory Visit: Payer: Self-pay | Admitting: *Deleted

## 2015-01-21 DIAGNOSIS — Z8673 Personal history of transient ischemic attack (TIA), and cerebral infarction without residual deficits: Secondary | ICD-10-CM

## 2015-01-21 DIAGNOSIS — R413 Other amnesia: Secondary | ICD-10-CM

## 2015-01-21 DIAGNOSIS — R2681 Unsteadiness on feet: Secondary | ICD-10-CM

## 2015-01-23 ENCOUNTER — Encounter (HOSPITAL_COMMUNITY): Payer: Self-pay | Admitting: Psychiatry

## 2015-01-23 ENCOUNTER — Telehealth (HOSPITAL_COMMUNITY): Payer: Self-pay | Admitting: *Deleted

## 2015-01-23 ENCOUNTER — Ambulatory Visit (INDEPENDENT_AMBULATORY_CARE_PROVIDER_SITE_OTHER): Payer: 59 | Admitting: Psychiatry

## 2015-01-23 VITALS — BP 117/81 | HR 77 | Ht 63.0 in | Wt 128.8 lb

## 2015-01-23 DIAGNOSIS — F411 Generalized anxiety disorder: Secondary | ICD-10-CM | POA: Diagnosis not present

## 2015-01-23 DIAGNOSIS — F900 Attention-deficit hyperactivity disorder, predominantly inattentive type: Secondary | ICD-10-CM | POA: Diagnosis not present

## 2015-01-23 DIAGNOSIS — F332 Major depressive disorder, recurrent severe without psychotic features: Secondary | ICD-10-CM

## 2015-01-23 MED ORDER — ALPRAZOLAM 1 MG PO TABS: 1.0000 mg | ORAL_TABLET | Freq: Two times a day (BID) | ORAL | Status: DC

## 2015-01-23 MED ORDER — FLUOXETINE HCL 20 MG PO CAPS: 20.0000 mg | ORAL_CAPSULE | Freq: Every day | ORAL | Status: DC

## 2015-01-23 MED ORDER — METHYLPHENIDATE HCL 20 MG PO TABS: 20.0000 mg | ORAL_TABLET | Freq: Two times a day (BID) | ORAL | Status: DC

## 2015-01-23 MED ORDER — CARBAMAZEPINE 200 MG PO TABS: ORAL_TABLET | ORAL | Status: DC
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
## 2015-01-26 ENCOUNTER — Telehealth (HOSPITAL_COMMUNITY): Payer: Self-pay | Admitting: *Deleted

## 2015-01-26 NOTE — Telephone Encounter (Signed)
Tanya at [facility].   voice message from [place].   Please call them, they have questions regarding script brought in by patient.

## 2015-01-26 NOTE — Telephone Encounter (Signed)
Called Tanya back in reference to previous call. Per tanya, pt medication quantity for her Aug script quantity does not have a correct quantity. Informed Tanya to have pt call office for new script when it's time for refill. Tanya agreed.

## 2015-01-26 NOTE — Telephone Encounter (Signed)
voice message from [facility] [facility].   Please call them, they have questions regarding script brought in by patient.

## 2015-01-28 ENCOUNTER — Telehealth: Payer: Self-pay | Admitting: Family Medicine

## 2015-01-28 NOTE — Telephone Encounter (Addendum)
Patient is calling to ask about her referral to a neurologist [phone]

## 2015-01-29 ENCOUNTER — Telehealth (HOSPITAL_COMMUNITY): Payer: Self-pay | Admitting: *Deleted

## 2015-01-29 NOTE — Telephone Encounter (Signed)
voice message from patient, she can't take the Tegretol prescribed for her.   It makes her sleep all day long.    As of today she said she is stopping it.   She wants to know how to dispose of the medicine.

## 2015-01-30 NOTE — Telephone Encounter (Signed)
lmtrc

## 2015-02-03 NOTE — Telephone Encounter (Signed)
Submitted [facility] compass referral with Reference number [id number] valid 02/03/15-08/06/15 with 6 visits faxed to [place]

## 2015-02-03 NOTE — Telephone Encounter (Signed)
Pt has appt scheduled for august 12th with Dr. Jannifer Franklin at 8:30am, need to submit a compass referral and fax to [phone]

## 2015-02-04 NOTE — Telephone Encounter (Signed)
lm

## 2015-02-05 ENCOUNTER — Other Ambulatory Visit (HOSPITAL_COMMUNITY): Payer: Self-pay | Admitting: Psychiatry

## 2015-02-05 ENCOUNTER — Other Ambulatory Visit: Payer: Self-pay | Admitting: Family Medicine

## 2015-02-05 NOTE — Telephone Encounter (Signed)
Pt is aware and showed understanding

## 2015-02-05 NOTE — Telephone Encounter (Signed)
Per pt, Tegretol is making her very tired. Per pt she can not get out of bed. Per pt, she would like to know if Dr. Harrington Challenger could put her on something else and would like to know how to dispose of the Tegretol since she stopped taking it. Pt number is [phone].

## 2015-02-05 NOTE — Telephone Encounter (Signed)
We can't dispose of it here, she can ask her pahrmacy

## 2015-02-06 NOTE — Telephone Encounter (Signed)
Refill appropriate and filled per protocol. 

## 2015-02-11 ENCOUNTER — Telehealth (HOSPITAL_COMMUNITY): Payer: Self-pay | Admitting: *Deleted

## 2015-02-12 NOTE — Telephone Encounter (Signed)
Pt called stating she would like refill for her Trazodone. Called [facility] and they stated that pt have refills on file but they are having a bit of a problem with pt insurance company. Per pharmacist, insurance informed them that they will have problem fixed by the 12th. Informed pt and she stated that she wanted refills for [facility]. Per pt, she called [facility] to see if they could start filling all of her medications due to her just wanting one pharmacy to fill all of her medications. Pt later stated that [name] is suppose to be calling [facility] for them to transfer her refills to them Informed pt that information will be noted. Informed pt to call [facility] and make sure they get her Trazodone and if she have any other problems she should call office. Pt agreed.

## 2015-02-13 ENCOUNTER — Encounter: Payer: Self-pay | Admitting: Neurology

## 2015-02-13 ENCOUNTER — Ambulatory Visit (INDEPENDENT_AMBULATORY_CARE_PROVIDER_SITE_OTHER): Payer: 59 | Admitting: Neurology

## 2015-02-13 VITALS — BP 120/81 | HR 93 | Ht 63.0 in | Wt 127.8 lb

## 2015-02-13 DIAGNOSIS — F419 Anxiety disorder, unspecified: Secondary | ICD-10-CM | POA: Diagnosis not present

## 2015-02-13 DIAGNOSIS — R413 Other amnesia: Secondary | ICD-10-CM | POA: Diagnosis not present

## 2015-02-13 NOTE — Progress Notes (Signed)
Reason for visit: Memory disturbance  Referring physician: Dr. Rondel Oh is a 54 y.o. female  History of present illness:  [name] is a 54 year old right-handed white female with a history of a right frontal intracranial hemorrhage that occurred in 1999. The source of the hemorrhage was never known, a cerebral angiogram did not show any underlying aneurysm or AVM. The patient has had some mild left-sided symptoms as a residual from this hemorrhage, but she was able to return to work and function relatively normally. The patient indicates that over the last 2 or 3 years, she has had increasing problems with depression and anxiety. She is having panic attacks on a regular basis up to 2 or 3 times a day. The patient has difficulty focusing, she has noted some problems with her mind racing throughout the day, she has given up cooking and paying the bills. The patient has panic attacks while driving, and she limits this activity as well. She reports difficulty remembering names for people and places, and difficulty remembering recent events. The patient indicates that she sleeps well. She denies any new numbness or weakness of the extremities. The patient has some mild gait instability, but this has not worsened over time. The patient does have irritable bowel syndrome symptoms. She has occasional headaches 3 or 4 times a week, she takes oxycodone for this. She is followed through psychiatry for her anxiety and depression. She has had some recent issues with near syncope, no actual syncopal events. This has since resolved. The patient is interested in going on disability, and she comes to this office for an evaluation.  Past Medical History  Diagnosis Date  . Hypertension     Mild; provoked by stress and anxiety  . Intracerebral bleed     No aneurysm; followed by Dr. Sherwood Gambler  . Hyperlipidemia   . GERD (gastroesophageal reflux disease)   . Depression   . Anxiety   . Chest  pain 09/2011    Cardiac cath-normal coronaries  . Sleep apnea     Stop Bang score of 4  . Bipolar disorder   . Arthritis   . Carpal tunnel syndrome     Bilateral  . Constipation   . Hyperlipemia   . Stroke 1999    hemorrhagic stroke; weakness of left side    Past Surgical History  Procedure Laterality Date  . Abdominal hysterectomy    . Cervical fusion    . Rectocele repair      x2  . Cardiac catheterization    . Lipoma excision Left 11/18/2013    Procedure: EXCISION OF SOFT TISSUE MASS-LEFT THIGH;  Surgeon: Jamesetta So, MD;  Location: [facility];  Service: General;  Laterality: Left;  . Left heart catheterization with coronary angiogram N/A 09/23/2011    Procedure: LEFT HEART CATHETERIZATION WITH CORONARY ANGIOGRAM;  Surgeon: Thayer Headings, MD;  Location: [facility] CATH LAB;  Service: Cardiovascular;  Laterality: N/A;  . Brain surgery  1999    to remove blood clot after stroke   . Cholecystectomy N/A 10/14/2014    Procedure: LAPAROSCOPIC CHOLECYSTECTOMY WITH INTRAOPERATIVE CHOLANGIOGRAM;  Surgeon: Jackolyn Confer, MD;  Location: [facility] OR;  Service: General;  Laterality: N/A;    Family History  Problem Relation Age of Onset  . Coronary artery disease Paternal Grandfather   . Coronary artery disease Paternal Uncle   . Cancer Mother     breast   . Hypertension Mother   . Hyperlipidemia Mother   .  Depression Mother   . Anxiety disorder Mother   . Drug abuse Sister   . Depression Cousin   . Drug abuse Cousin     Social history:  reports that she quit smoking about 17 years ago. Her smoking use included Cigarettes. She has a 19 pack-year smoking history. She has never used smokeless tobacco. She reports that she does not drink alcohol or use illicit drugs.  Medications:  Prior to Admission medications   Medication Sig Start Date End Date Taking? Authorizing Provider  acetaminophen (TYLENOL) 500 MG tablet Take 500 mg by mouth every 6 (six) hours as needed.   Yes Historical Provider,  MD  ALPRAZolam Duanne Moron) 1 MG tablet Take 1 tablet (1 mg total) by mouth 2 (two) times daily. 01/23/15  Yes Cloria Spring, MD  Ascorbic Acid (VITAMIN C) 1000 MG tablet Take 1,000 mg by mouth daily.   Yes Historical Provider, MD  B Complex-C (SUPER B COMPLEX PO) Take 1 tablet by mouth daily.    Yes Historical Provider, MD  BLACK COHOSH PO Take 1 tablet by mouth daily.    Yes Historical Provider, MD  carbamazepine (TEGRETOL) 200 MG tablet Take one at bedtime for two weeks then increase to one twice a day 01/23/15  Yes Cloria Spring, MD  cyclobenzaprine (FLEXERIL) 10 MG tablet TAKE 1 TABLET BY MOUTH THREE TIMES DAILY AS NEEDED FOR MUSCLE SPASMS. 06/13/14  Yes Alycia Rossetti, MD  dicyclomine (BENTYL) 20 MG tablet Take 20 mg by mouth 4 (four) times daily.   Yes Historical Provider, MD  Evening Primrose Oil CAPS Take 1 capsule by mouth daily.   Yes Historical Provider, MD  FLUoxetine (PROZAC) 20 MG capsule Take 1 capsule (20 mg total) by mouth daily. 01/23/15 01/23/16 Yes Cloria Spring, MD  hydrochlorothiazide (HYDRODIURIL) 25 MG tablet Take 1 tablet (25 mg total) by mouth daily. 02/06/15  Yes Alycia Rossetti, MD  methylphenidate (RITALIN) 20 MG tablet Take 1 tablet (20 mg total) by mouth 2 (two) times daily with breakfast and lunch. 01/23/15 01/23/16 Yes Cloria Spring, MD  methylphenidate (RITALIN) 20 MG tablet Take 1 tablet (20 mg total) by mouth 2 (two) times daily with breakfast and lunch. 01/23/15 01/23/16 Yes Cloria Spring, MD  Multiple Vitamin (MULITIVITAMIN WITH MINERALS) TABS Take 1 tablet by mouth daily.   Yes Historical Provider, MD  nystatin (MYCOSTATIN) powder APPLY FOUR TIMES DAILY AS NEEDED. 07/10/14  Yes Alycia Rossetti, MD  OVER THE COUNTER MEDICATION Take 1 tablet by mouth daily. OVER THE COUNTER ALLERGY MEDICATION   Yes Historical Provider, MD  oxyCODONE (OXY IR/ROXICODONE) 5 MG immediate release tablet Take 1-2 tablets (5-10 mg total) by mouth every 4 (four) hours as needed for moderate  pain, severe pain or breakthrough pain. 10/14/14  Yes Jackolyn Confer, MD  pantoprazole (PROTONIX) 40 MG tablet Take 1 tablet (40 mg total) by mouth 2 (two) times daily. 12/30/14 12/30/15 Yes Alycia Rossetti, MD  polyethylene glycol powder (GLYCOLAX/MIRALAX) powder Take 17 g by mouth daily. 02/18/14  Yes Alycia Rossetti, MD  POTASSIUM PO Take 100 mg by mouth daily.    Yes Historical Provider, MD  simvastatin (ZOCOR) 20 MG tablet Take 1 tablet (20 mg total) by mouth at bedtime. 01/07/15  Yes Alycia Rossetti, MD  traZODone (DESYREL) 50 MG tablet Take 2 tablets (100 mg total) by mouth at bedtime. 10/31/14  Yes Cloria Spring, MD      Allergies  Allergen Reactions  .  Morphine And Related Hives  . Promethazine Hcl     Causes patient to become Hyper    ROS:  Out of a complete 14 system review of symptoms, the patient complains only of the following symptoms, and all other reviewed systems are negative.  Memory disturbance Anxiety, depression  Blood pressure 120/81, pulse 93, height 5' 3"  (1.6 m), weight 127 lb 12.8 oz (57.97 kg).  Physical Exam  General: The patient is alert and cooperative at the time of the examination.  Eyes: Pupils are equal, round, and reactive to light. Discs are flat bilaterally.  Neck: The neck is supple, no carotid bruits are noted.  Respiratory: The respiratory examination is clear.  Cardiovascular: The cardiovascular examination reveals a regular rate and rhythm, no obvious murmurs or rubs are noted.  Skin: Extremities are without significant edema.  Neurologic Exam  Mental status: The patient is alert and oriented x 2 at the time of the examination (not oriented to date). The Mini-Mental Status Examination done today shows a total score of 26/30. The patient is able to name 7 animals in 30 seconds.  Cranial nerves: Facial symmetry is not present, there is a depression of the left nasolabial fold. There is initially some decreased pinprick sensation on the  right face as compared to the left, but later the patient changed the examination indicating that the left face is decreased compared to the right, and she splits the midline with vibration sensation on the forehead, decreased on the left. The strength of the facial muscles and the muscles to head turning and shoulder shrug are normal bilaterally. Speech is well enunciated, no aphasia or dysarthria is noted. Extraocular movements are full. Visual fields are full. The tongue is midline, and the patient has symmetric elevation of the soft palate. No obvious hearing deficits are noted.  Motor: The motor testing reveals 5 over 5 strength of all 4 extremities. Good symmetric motor tone is noted throughout.  Sensory: Sensory testing is intact to vibration sensation, and position sense on all 4 extremities. Pinprick sensation is decreased on the left arm and leg as compared to the right. No evidence of extinction is noted.  Coordination: Cerebellar testing reveals good finger-nose-finger and heel-to-shin bilaterally.  Gait and station: Gait is normal. Tandem gait is minimally unsteady. Romberg is negative. No drift is seen.  Reflexes: Deep tendon reflexes are symmetric and normal bilaterally. Toes are downgoing bilaterally.   MRI brain 01/19/15:  IMPRESSION: No acute infarct.  Right frontal lobe moderate to large area of encephalomalacia may reflect changes of prior hemorrhage/infarct.  Scattered minimal white matter type changes may reflect result of small vessel disease.  No intracranial mass lesion noted on this unenhanced exam.  Mild global atrophy without hydrocephalus.  Small vertebral arteries and basilar artery with fetal type contribution to the posterior cerebral arteries.  * MRI scan images were reviewed online. I agree with the written report.    Assessment/Plan:  1. History of right frontal intracranial hemorrhage  2. Anxiety and depression, panic disorder  3.  Reported memory disturbance  4. Nonorganic neurologic examination  The patient indicates that she has been working up until about a year ago. She has had an increase in anxiety and depression, with frequent panic attacks. This may be the underlying etiology of her difficulty with concentration and memory. The patient will be set up for blood work, and she will have an EEG study given the episodes of panic disorder. The patient will be sent for formal  neuropsychological evaluation to further delineate the reported memory issues and to have some insight into the effect that the anxiety and depression have on her cognitive functioning. The patient appears to have a nonorganic sensory examination. She is completely focused on getting disability, not on improving her memory. She will follow-up in 4 months.  Jill Alexanders MD 02/15/2015 4:15 PM  [facility] [address] [place],  [address]  Phone [phone] Fax [phone]

## 2015-02-13 NOTE — Patient Instructions (Addendum)
We will check blood work today, and get an EEG study. I will set you up for neuropsychological testing looking at the memory problems closely. We will follow up in several months.   Depression Depression refers to feeling sad, low, down in the dumps, blue, gloomy, or empty. In general, there are two kinds of depression: 1. Normal sadness or normal grief. This kind of depression is one that we all feel from time to time after upsetting life experiences, such as the loss of a job or the ending of a relationship. This kind of depression is considered normal, is short lived, and resolves within a few days to 2 weeks. Depression experienced after the loss of a loved one (bereavement) often lasts longer than 2 weeks but normally gets better with time. 2. Clinical depression. This kind of depression lasts longer than normal sadness or normal grief or interferes with your ability to function at home, at work, and in school. It also interferes with your personal relationships. It affects almost every aspect of your life. Clinical depression is an illness. Symptoms of depression can also be caused by conditions other than those mentioned above, such as:  Physical illness. Some physical illnesses, including underactive thyroid gland (hypothyroidism), severe anemia, specific types of cancer, diabetes, uncontrolled seizures, heart and lung problems, strokes, and chronic pain are commonly associated with symptoms of depression.  Side effects of some prescription medicine. In some people, certain types of medicine can cause symptoms of depression.  Substance abuse. Abuse of alcohol and illicit drugs can cause symptoms of depression. SYMPTOMS Symptoms of normal sadness and normal grief include the following:  Feeling sad or crying for short periods of time.  Not caring about anything (apathy).  Difficulty sleeping or sleeping too much.  No longer able to enjoy the things you used to enjoy.  Desire to be  by oneself all the time (social isolation).  Lack of energy or motivation.  Difficulty concentrating or remembering.  Change in appetite or weight.  Restlessness or agitation. Symptoms of clinical depression include the same symptoms of normal sadness or normal grief and also the following symptoms:  Feeling sad or crying all the time.  Feelings of guilt or worthlessness.  Feelings of hopelessness or helplessness.  Thoughts of suicide or the desire to harm yourself (suicidal ideation).  Loss of touch with reality (psychotic symptoms). Seeing or hearing things that are not real (hallucinations) or having false beliefs about your life or the people around you (delusions and paranoia). DIAGNOSIS  The diagnosis of clinical depression is usually based on how bad the symptoms are and how long they have lasted. Your health care provider will also ask you questions about your medical history and substance use to find out if physical illness, use of prescription medicine, or substance abuse is causing your depression. Your health care provider may also order blood tests. TREATMENT  Often, normal sadness and normal grief do not require treatment. However, sometimes antidepressant medicine is given for bereavement to ease the depressive symptoms until they resolve. The treatment for clinical depression depends on how bad the symptoms are but often includes antidepressant medicine, counseling with a mental health professional, or both. Your health care provider will help to determine what treatment is best for you. Depression caused by physical illness usually goes away with appropriate medical treatment of the illness. If prescription medicine is causing depression, talk with your health care provider about stopping the medicine, decreasing the dose, or changing to another  medicine. Depression caused by the abuse of alcohol or illicit drugs goes away when you stop using these substances. Some adults  need professional help in order to stop drinking or using drugs. SEEK IMMEDIATE MEDICAL CARE IF:  You have thoughts about hurting yourself or others.  You lose touch with reality (have psychotic symptoms).  You are taking medicine for depression and have a serious side effect. FOR MORE INFORMATION  National [employer] on Mental Illness: www.nami.[employer]: https://carter.com/ Document Released: 06/17/2000 Document Revised: 11/04/2013 Document Reviewed: 09/19/2011 [facility] Patient Information 2015 [place], [place]. This information is not intended to replace advice given to you by your health care provider. Make sure you discuss any questions you have with your health care provider.

## 2015-02-16 LAB — VITAMIN B12: Vitamin B-12: 627 pg/mL (ref 211–946)

## 2015-02-16 LAB — HIV ANTIBODY (ROUTINE TESTING W REFLEX): HIV Screen 4th Generation wRfx: NONREACTIVE

## 2015-02-16 LAB — METHYLMALONIC ACID, SERUM: Methylmalonic Acid: 168 nmol/L (ref 0–378)

## 2015-02-16 LAB — COPPER, SERUM: Copper: 120 ug/dL (ref 72–166)

## 2015-02-16 LAB — RPR: RPR Ser Ql: NONREACTIVE

## 2015-02-16 LAB — SEDIMENTATION RATE: Sed Rate: 2 mm/hr (ref 0–40)

## 2015-02-17 ENCOUNTER — Telehealth: Payer: Self-pay

## 2015-02-17 NOTE — Telephone Encounter (Signed)
I called the patient to inform her that her lab results were normal.

## 2015-02-17 NOTE — Telephone Encounter (Signed)
-----   Message from Kathrynn Ducking, MD sent at 02/16/2015  4:58 PM EDT -----  The blood work results are unremarkable. Please call the patient.  ----- Message -----    From: [facility] Lab Results In Interface    Sent: 02/14/2015   7:42 AM      To: Kathrynn Ducking, MD

## 2015-02-17 NOTE — Telephone Encounter (Signed)
Patient returned your call.

## 2015-02-17 NOTE — Telephone Encounter (Signed)
I called the patient and relayed results. 

## 2015-02-19 ENCOUNTER — Telehealth: Payer: Self-pay | Admitting: Neurology

## 2015-02-19 ENCOUNTER — Ambulatory Visit (INDEPENDENT_AMBULATORY_CARE_PROVIDER_SITE_OTHER): Payer: 59 | Admitting: Neurology

## 2015-02-19 DIAGNOSIS — R410 Disorientation, unspecified: Secondary | ICD-10-CM | POA: Diagnosis not present

## 2015-02-19 DIAGNOSIS — R413 Other amnesia: Secondary | ICD-10-CM

## 2015-02-19 DIAGNOSIS — F419 Anxiety disorder, unspecified: Secondary | ICD-10-CM

## 2015-02-19 NOTE — Telephone Encounter (Signed)
I called the patient. The EEG was normal.

## 2015-02-19 NOTE — Procedures (Signed)
    History:  [name] is a 54 year old patient with a history of a right frontal intracranial hemorrhage in 1999. The patient has developed increasing problems with anxiety and a panic disorder. The patient also suffers from depression. The patient is reporting some decreased concentration and memory. Given the history of episodes of panic attacks, the patient is being evaluated for possible seizures.  This is a routine EEG. No skull defects are noted. Medications include Tylenol, Xanax, carbamazepine, Flexeril, Bentyl, Prozac, hydrochlorothiazide, Ritalin, multivitamins, oxycodone, Protonix, potassium supplementation, Zocor, and trazodone.   EEG classification: Normal awake  Description of the recording: The background rhythms of this recording consists of a fairly well modulated medium amplitude alpha rhythm of 10 Hz that is reactive to eye opening and closure. As the record progresses, the patient appears to remain in the waking state throughout the recording. Photic stimulation was not performed. Hyperventilation was performed, resulting in a minimal buildup of the background rhythm activities without significant slowing seen. At no time during the recording does there appear to be evidence of spike or spike wave discharges or evidence of focal slowing. EKG monitor shows no evidence of cardiac rhythm abnormalities with a heart rate of 78.  Impression: This is a normal EEG recording in the waking state. No evidence of ictal or interictal discharges are seen.

## 2015-02-22 ENCOUNTER — Other Ambulatory Visit: Payer: Self-pay | Admitting: Family Medicine

## 2015-02-23 NOTE — Telephone Encounter (Signed)
Refill appropriate and filled per protocol. 

## 2015-02-24 ENCOUNTER — Other Ambulatory Visit: Payer: Self-pay | Admitting: Neurology

## 2015-02-24 DIAGNOSIS — R413 Other amnesia: Secondary | ICD-10-CM

## 2015-02-25 ENCOUNTER — Telehealth (HOSPITAL_COMMUNITY): Payer: Self-pay | Admitting: *Deleted

## 2015-02-27 ENCOUNTER — Telehealth (HOSPITAL_COMMUNITY): Payer: Self-pay | Admitting: *Deleted

## 2015-02-27 ENCOUNTER — Other Ambulatory Visit (HOSPITAL_COMMUNITY): Payer: Self-pay | Admitting: Psychiatry

## 2015-02-27 MED ORDER — METHYLPHENIDATE HCL 20 MG PO TABS: 20.0000 mg | ORAL_TABLET | Freq: Two times a day (BID) | ORAL | Status: DC

## 2015-02-27 NOTE — Telephone Encounter (Signed)
printed

## 2015-02-27 NOTE — Telephone Encounter (Signed)
Pt called stating that her second script that was written for her Ratilin 20 mg BID, she only received 30 tablets instead of 60 tablets. Per pt she is out of medication. Per pt, she already filled her first printed script that was written for 60 tablets. But she was about to fill the second script and realized she did not have 60 tablets. Pt number is [phone]

## 2015-02-27 NOTE — Telephone Encounter (Signed)
Pt called stating that her second script that was written for her Ratilin 20 mg BID, she only received 30 tablets instead of 60 tablets. Per pt she is out of medication. Per pt, she already filled her first printed script that was written for 60 tablets. But she was about to fill the second script and realized she did not have 60 tablets. Pt number is [phone]

## 2015-02-27 NOTE — Telephone Encounter (Signed)
lmtcb

## 2015-02-27 NOTE — Telephone Encounter (Signed)
Pt came into office to pick up her printed script she requested. Pt agreed with script. Pt D/L number is [id number] with expiration number 11-06-2015.

## 2015-02-27 NOTE — Telephone Encounter (Signed)
Pt called stating that her second script that was written for her Ratilin 20 mg BID, she only received 30 tablets instead of 60 tablets. Per pt she is out of medication. Per pt, she already filled her first printed script that was written for 60 tablets. But she was about to fill the second script and realized she did not have 60 tablets. Pt number is [phone]

## 2015-03-03 NOTE — Telephone Encounter (Signed)
Pt came into office to pick script up

## 2015-03-06 ENCOUNTER — Ambulatory Visit (HOSPITAL_COMMUNITY): Payer: Self-pay | Admitting: Psychiatry

## 2015-03-24 ENCOUNTER — Encounter (HOSPITAL_COMMUNITY): Payer: Self-pay | Admitting: Psychiatry

## 2015-03-24 ENCOUNTER — Ambulatory Visit (INDEPENDENT_AMBULATORY_CARE_PROVIDER_SITE_OTHER): Payer: 59 | Admitting: Psychiatry

## 2015-03-24 VITALS — BP 129/80 | HR 75 | Ht 63.0 in | Wt 121.6 lb

## 2015-03-24 DIAGNOSIS — F332 Major depressive disorder, recurrent severe without psychotic features: Secondary | ICD-10-CM | POA: Diagnosis not present

## 2015-03-24 DIAGNOSIS — F900 Attention-deficit hyperactivity disorder, predominantly inattentive type: Secondary | ICD-10-CM

## 2015-03-24 DIAGNOSIS — F411 Generalized anxiety disorder: Secondary | ICD-10-CM

## 2015-03-24 MED ORDER — DONEPEZIL HCL 5 MG PO TABS: 5.0000 mg | ORAL_TABLET | Freq: Every day | ORAL | Status: DC

## 2015-03-24 MED ORDER — TRAZODONE HCL 50 MG PO TABS: 100.0000 mg | ORAL_TABLET | Freq: Every day | ORAL | Status: DC

## 2015-03-24 MED ORDER — FLUOXETINE HCL 20 MG PO CAPS: 20.0000 mg | ORAL_CAPSULE | Freq: Every day | ORAL | Status: DC

## 2015-03-24 MED ORDER — ALPRAZOLAM 1 MG PO TABS: 1.0000 mg | ORAL_TABLET | Freq: Two times a day (BID) | ORAL | Status: DC

## 2015-03-24 MED ORDER — METHYLPHENIDATE HCL 10 MG PO TABS: 10.0000 mg | ORAL_TABLET | Freq: Two times a day (BID) | ORAL | Status: DC
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
## 2015-03-30 ENCOUNTER — Other Ambulatory Visit: Payer: Self-pay | Admitting: Family Medicine

## 2015-04-12 ENCOUNTER — Other Ambulatory Visit: Payer: Self-pay | Admitting: Family Medicine

## 2015-04-13 NOTE — Telephone Encounter (Signed)
Okay to refill? 

## 2015-04-13 NOTE — Telephone Encounter (Signed)
Ok to refill 

## 2015-04-13 NOTE — Telephone Encounter (Signed)
Prescription sent to pharmacy.

## 2015-04-14 ENCOUNTER — Telehealth (HOSPITAL_COMMUNITY): Payer: Self-pay | Admitting: *Deleted

## 2015-04-14 ENCOUNTER — Telehealth: Payer: Self-pay | Admitting: Family Medicine

## 2015-04-14 NOTE — Telephone Encounter (Signed)
Received call from patient.   Reports that [employer] MD started her on Aricept for memory, but she thinks that she may be having some reaction to it. States that she has some tingling in hands and feet and tongue, but did state that she stopped taking Aricept after 2 doses. Reports that tingling did continue even after stopping Aricept.   States that she noted tingling today with feeling of tachycardia. Reports that she checked BP and noted it to be elevated:  3:20pm 169/92, HR- 89 4:00pm 167/94, HR- 76 4:30pm 151/88, HR- 74  Advised to monitor BP 2x daily in random times throughout the day, and relay readings to [facility] on Thursday. Advised that high BP readings now sound anxiety related.   Advised that if S/Sx of CVA appear, to go to ER for eval. Advised that if she has further concerns to not hesitate to contact office.

## 2015-04-14 NOTE — Telephone Encounter (Addendum)
Per called stating she don't know what is going on with her. She thought if was her newest medication that Dr. Harrington Challenger just prescribed her. Per pt, her tongue was tingling, lightheaded and was not feeling good. Per pt, she stopped it to see if it was the medication. Per pt, the next day she was still feeling the same was. Per pt she called her PCP office already and left a message for them to call her back. Per pt, she don't know if it has to deal with her BP or what but she don't want to have a second stroke. Asked pt how is she feeling now and she stated shes ok but her first BP was 167/94 with pulse at 76, then she checked it again and it was 169/92 with pulse 89. Informed to pt that if she feels too bad she should go to the ER and pt agreed and stated she is waiting for her husband to come home. While on the phone with pt, pt stated she just took her BP and per pt, she stated that her BP looks like it was coming down. Informed pt to call her PCP again and pt agreed. Office is waiting for a call back from pt.

## 2015-04-14 NOTE — Telephone Encounter (Signed)
PHONE CALL FROM PATIENT ASK FOR [facility] TO CALL HER.

## 2015-04-14 NOTE — Telephone Encounter (Signed)
voice message from patient who stated concersn regarding medication and she think side effects.   she is having diarrea, rapid heart beat, tingling in hands and tongue, and anxiety attack.   It scares her and she will not take it tonight.

## 2015-04-14 NOTE — Telephone Encounter (Signed)
Patient calling to speak with you regarding having high blood pressure  [phone] (H)

## 2015-04-15 NOTE — Telephone Encounter (Signed)
Agree, she may be having increased anxiety which has been an issue. Noted she stopped the aricept, f/u Thursday with BP readings off meds

## 2015-04-15 NOTE — Telephone Encounter (Signed)
Called pt back information put in.

## 2015-04-22 ENCOUNTER — Ambulatory Visit (HOSPITAL_COMMUNITY): Payer: Self-pay | Admitting: Psychology

## 2015-04-27 ENCOUNTER — Encounter (HOSPITAL_COMMUNITY): Payer: Self-pay | Admitting: Psychiatry

## 2015-04-27 ENCOUNTER — Ambulatory Visit (INDEPENDENT_AMBULATORY_CARE_PROVIDER_SITE_OTHER): Payer: 59 | Admitting: Psychiatry

## 2015-04-27 VITALS — BP 134/81 | HR 70 | Ht 63.0 in | Wt 122.0 lb

## 2015-04-27 DIAGNOSIS — F900 Attention-deficit hyperactivity disorder, predominantly inattentive type: Secondary | ICD-10-CM

## 2015-04-27 DIAGNOSIS — F332 Major depressive disorder, recurrent severe without psychotic features: Secondary | ICD-10-CM | POA: Diagnosis not present

## 2015-04-27 DIAGNOSIS — F411 Generalized anxiety disorder: Secondary | ICD-10-CM

## 2015-04-27 MED ORDER — FLUOXETINE HCL 20 MG PO CAPS: 20.0000 mg | ORAL_CAPSULE | Freq: Every day | ORAL | Status: DC

## 2015-04-27 MED ORDER — DONEPEZIL HCL 5 MG PO TABS: 5.0000 mg | ORAL_TABLET | Freq: Every day | ORAL | Status: DC

## 2015-04-27 MED ORDER — TRAZODONE HCL 50 MG PO TABS: 100.0000 mg | ORAL_TABLET | Freq: Every day | ORAL | Status: DC

## 2015-04-27 MED ORDER — METHYLPHENIDATE HCL 10 MG PO TABS: 10.0000 mg | ORAL_TABLET | Freq: Two times a day (BID) | ORAL | Status: DC
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
## 2015-04-29 ENCOUNTER — Other Ambulatory Visit: Payer: Self-pay | Admitting: Family Medicine

## 2015-04-30 NOTE — Telephone Encounter (Signed)
Refill appropriate and filled per protocol. 

## 2015-05-11 ENCOUNTER — Ambulatory Visit (INDEPENDENT_AMBULATORY_CARE_PROVIDER_SITE_OTHER): Payer: 59 | Admitting: Family Medicine

## 2015-05-11 ENCOUNTER — Encounter: Payer: Self-pay | Admitting: Family Medicine

## 2015-05-11 VITALS — BP 134/78 | HR 76 | Temp 98.1°F | Resp 14 | Ht 63.0 in | Wt 121.0 lb

## 2015-05-11 DIAGNOSIS — I10 Essential (primary) hypertension: Secondary | ICD-10-CM

## 2015-05-11 DIAGNOSIS — F329 Major depressive disorder, single episode, unspecified: Secondary | ICD-10-CM | POA: Diagnosis not present

## 2015-05-11 DIAGNOSIS — R413 Other amnesia: Secondary | ICD-10-CM

## 2015-05-11 DIAGNOSIS — Z23 Encounter for immunization: Secondary | ICD-10-CM | POA: Diagnosis not present

## 2015-05-11 DIAGNOSIS — F32A Depression, unspecified: Secondary | ICD-10-CM

## 2015-05-11 DIAGNOSIS — E785 Hyperlipidemia, unspecified: Secondary | ICD-10-CM

## 2015-05-11 DIAGNOSIS — K589 Irritable bowel syndrome without diarrhea: Secondary | ICD-10-CM

## 2015-05-11 DIAGNOSIS — E663 Overweight: Secondary | ICD-10-CM | POA: Diagnosis not present

## 2015-05-11 DIAGNOSIS — K582 Mixed irritable bowel syndrome: Secondary | ICD-10-CM | POA: Insufficient documentation

## 2015-05-11 LAB — CBC WITH DIFFERENTIAL/PLATELET
Basophils Absolute: 0 10*3/uL (ref 0.0–0.1)
Basophils Relative: 0 % (ref 0–1)
Eosinophils Absolute: 0.1 10*3/uL (ref 0.0–0.7)
Eosinophils Relative: 1 % (ref 0–5)
HCT: 42.5 % (ref 36.0–46.0)
Hemoglobin: 14.1 g/dL (ref 12.0–15.0)
Lymphocytes Relative: 30 % (ref 12–46)
Lymphs Abs: 1.7 10*3/uL (ref 0.7–4.0)
MCH: 31.8 pg (ref 26.0–34.0)
MCHC: 33.2 g/dL (ref 30.0–36.0)
MCV: 95.7 fL (ref 78.0–100.0)
MPV: 9.5 fL (ref 8.6–12.4)
Monocytes Absolute: 0.3 10*3/uL (ref 0.1–1.0)
Monocytes Relative: 6 % (ref 3–12)
Neutro Abs: 3.5 10*3/uL (ref 1.7–7.7)
Neutrophils Relative %: 63 % (ref 43–77)
Platelets: 311 10*3/uL (ref 150–400)
RBC: 4.44 MIL/uL (ref 3.87–5.11)
RDW: 12.6 % (ref 11.5–15.5)
WBC: 5.5 10*3/uL (ref 4.0–10.5)

## 2015-05-11 LAB — COMPREHENSIVE METABOLIC PANEL
ALT: 14 U/L (ref 6–29)
AST: 15 U/L (ref 10–35)
Albumin: 4.5 g/dL (ref 3.6–5.1)
Alkaline Phosphatase: 54 U/L (ref 33–130)
BUN: 15 mg/dL (ref 7–25)
CO2: 32 mmol/L — ABNORMAL HIGH (ref 20–31)
Calcium: 9.4 mg/dL (ref 8.6–10.4)
Chloride: 99 mmol/L (ref 98–110)
Creat: 1.22 mg/dL — ABNORMAL HIGH (ref 0.50–1.05)
Glucose, Bld: 93 mg/dL (ref 70–99)
Potassium: 3.7 mmol/L (ref 3.5–5.3)
Sodium: 142 mmol/L (ref 135–146)
Total Bilirubin: 1 mg/dL (ref 0.2–1.2)
Total Protein: 6.6 g/dL (ref 6.1–8.1)

## 2015-05-11 LAB — LIPID PANEL
Cholesterol: 174 mg/dL (ref 125–200)
HDL: 62 mg/dL (ref >=46)
LDL Cholesterol: 84 mg/dL (ref <130)
Total CHOL/HDL Ratio: 2.8 Ratio (ref <=5.0)
Triglycerides: 142 mg/dL (ref <150)
VLDL: 28 mg/dL (ref <30)

## 2015-05-11 NOTE — Assessment & Plan Note (Signed)
Severe depression and her financial woes make things much worst, she was always happy when she was working. Discussed she can find other things to do or other interest. We discussed some types of jobs she can apply for part time while she awaits her disability hearing.  She will continue meds per psychiatry, no active SI, she knows to call or go to ER if she has active SI

## 2015-05-11 NOTE — Progress Notes (Signed)
Patient ID: [name], female   DOB: [birth year], 54 y.o.   MRN: [id number]   Subjective:    Patient ID: [name], female    DOB: 1960-11-21, 54 y.o.   MRN: [id number]  Patient presents for 4 month F/U  Pt here for f/u. She was late to appt states she had diarrhea, her typical IBS- D. She has not taken any meds for this today.     MDD- she was seen by neurology- due to "memory issues" nothing specific found, 2/2 to depression which I had discussed with her before. She is very upset that she has not been given disability states she cant work. She has fleeting SI, every visit, but denies any plan, she was tearful and crying like most of our visits as well. Reviewed recent Pyschiatry note, same noted. No medication changes, but she was referral to Neuropsychologist. When asked if she would go to inpatient facility for intensive therapy and treatment, she declined stating she has to help her husband with too much around the house. 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   HTN/Hyperlipidemia- due for fasting labs  Review Of Systems:  GEN- denies fatigue, fever, weight loss,+weakness, recent illness HEENT- denies eye drainage, change in vision, nasal discharge, CVS- denies chest pain, palpitations RESP- denies SOB, cough, wheeze ABD- denies N/V, change in stools, abd pain GU- denies dysuria, hematuria, dribbling, incontinence MSK- denies joint pain, +muscle aches, injury Neuro- denies headache, dizziness, syncope, seizure activity       Objective:    BP 134/78 mmHg  Pulse 76  Temp(Src) 98.1 F (36.7 C) (Oral)  Resp 14  Ht 5' 3"  (1.6 m)  Wt 121 lb (54.885 kg)  BMI 21.44 kg/m2 GEN- NAD, alert and oriented x3 HEENT- PERRL, EOMI, non injected sclera, pink conjunctiva, MMM, oropharynx clear CVS- RRR, no murmur RESP-CTAB ABD-NABS,soft,NT,ND EXT- No edema Psych- depressed, crying, not anxious appearing,  normal speech, passive SI, no plan,no HI Pulses- Radial, 2+        Assessment & Plan:      Problem List Items Addressed This Visit    Overweight   Memory changes    2/2 Depression. Nothing significant found on work up from neurologyl including neg EEG      IBS (irritable bowel syndrome)    Immoidum taken in office, has bentyl as well       Hypertension    Controlled, no change to meds,       Relevant Orders   CBC with Differential/Platelet   Comprehensive metabolic panel   Hyperlipidemia - Primary (Chronic)    Cholesterol has been uncontrolled, restarted on zocor, last visit, recheck LFT and lipids      Relevant Orders   Lipid panel   Depression    Severe depression and her financial woes make things much worst, she was always happy when she was working. Discussed she can find other things to do or other interest. We discussed some types of jobs she can apply for part time while she awaits her disability hearing.  She will continue meds per psychiatry, no active SI, she knows to call or go to ER if she has active SI       Other Visit Diagnoses    Need for prophylactic vaccination and inoculation against influenza        Relevant Orders    Flu Vaccine  QUAD 36+ mos PF IM (Fluarix & Fluzone Quad PF) (Completed)       Note: This dictation was prepared with Dragon dictation along with smaller phrase technology. Any transcriptional errors that result from this process are unintentional.

## 2015-05-11 NOTE — Assessment & Plan Note (Signed)
2/2 Depression. Nothing significant found on work up from neurologyl including neg EEG

## 2015-05-11 NOTE — Patient Instructions (Signed)
Flu shot given  Continue current medications F/u with your psychiatrist Labs to be done Flu shot given F/U 4 months

## 2015-05-11 NOTE — Assessment & Plan Note (Signed)
Immoidum taken in office, has bentyl as well

## 2015-05-11 NOTE — Assessment & Plan Note (Signed)
Cholesterol has been uncontrolled, restarted on zocor, last visit, recheck LFT and lipids

## 2015-05-11 NOTE — Assessment & Plan Note (Signed)
Controlled, no change to meds,

## 2015-05-12 ENCOUNTER — Encounter: Payer: Self-pay | Admitting: *Deleted

## 2015-05-12 ENCOUNTER — Other Ambulatory Visit: Payer: Self-pay | Admitting: Family Medicine

## 2015-05-13 ENCOUNTER — Telehealth: Payer: Self-pay | Admitting: Obstetrics and Gynecology

## 2015-05-14 ENCOUNTER — Ambulatory Visit: Payer: 59 | Admitting: Obstetrics and Gynecology

## 2015-05-14 NOTE — Telephone Encounter (Signed)
Refill appropriate and filled per protocol. 

## 2015-05-18 NOTE — Telephone Encounter (Signed)
I certainly understand pt's dilemma.

## 2015-05-26 ENCOUNTER — Ambulatory Visit (HOSPITAL_COMMUNITY): Payer: Self-pay | Admitting: Psychiatry

## 2015-05-31 ENCOUNTER — Other Ambulatory Visit: Payer: Self-pay | Admitting: Family Medicine

## 2015-06-01 NOTE — Telephone Encounter (Signed)
Prescription sent to pharmacy.

## 2015-06-01 NOTE — Telephone Encounter (Signed)
okay

## 2015-06-01 NOTE — Telephone Encounter (Signed)
Ok to refill 

## 2015-06-02 ENCOUNTER — Telehealth: Payer: Self-pay | Admitting: Neurology

## 2015-06-02 NOTE — Telephone Encounter (Signed)
Patient called to reschedule appointment from previously cancelled appt 06/09/15 (pt cancelled stating "he's not doing anything for me, no need to pay him anymore money"). Patient advised of why she cancelled previous appointment. Patient states her Oneta Rack told her she needs his information. Patient advised next availability is end of January at the earliest. Patient states you can leave message.

## 2015-06-03 ENCOUNTER — Ambulatory Visit (INDEPENDENT_AMBULATORY_CARE_PROVIDER_SITE_OTHER): Payer: 59 | Admitting: Psychology

## 2015-06-03 DIAGNOSIS — R413 Other amnesia: Secondary | ICD-10-CM

## 2015-06-03 DIAGNOSIS — F332 Major depressive disorder, recurrent severe without psychotic features: Secondary | ICD-10-CM | POA: Diagnosis not present

## 2015-06-03 DIAGNOSIS — F09 Unspecified mental disorder due to known physiological condition: Secondary | ICD-10-CM | POA: Diagnosis not present
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
## 2015-06-09 ENCOUNTER — Ambulatory Visit: Payer: Self-pay | Admitting: Neurology

## 2015-06-09 ENCOUNTER — Telehealth: Payer: Self-pay | Admitting: Family Medicine

## 2015-06-09 NOTE — Telephone Encounter (Signed)
Patient left message on voicemail stating she would like a med called in for her kidneys, please call her at [phone]

## 2015-06-09 NOTE — Telephone Encounter (Signed)
Patient returned call.   States that she has burning with urination and lower back pain.   Advised that OV is required.   Appointment scheduled.

## 2015-06-09 NOTE — Telephone Encounter (Signed)
Call placed to patient to clarify.   Does she thinks she has an infection and requests and antibiotic? If so, patient will need an appointment.   [name].

## 2015-06-10 ENCOUNTER — Ambulatory Visit (INDEPENDENT_AMBULATORY_CARE_PROVIDER_SITE_OTHER): Payer: 59 | Admitting: Family Medicine

## 2015-06-10 ENCOUNTER — Encounter: Payer: Self-pay | Admitting: Family Medicine

## 2015-06-10 VITALS — BP 126/68 | HR 80 | Temp 98.2°F | Resp 14 | Ht 63.0 in | Wt 124.0 lb

## 2015-06-10 DIAGNOSIS — R109 Unspecified abdominal pain: Secondary | ICD-10-CM

## 2015-06-10 DIAGNOSIS — N289 Disorder of kidney and ureter, unspecified: Secondary | ICD-10-CM | POA: Diagnosis not present

## 2015-06-10 MED ORDER — CIPROFLOXACIN HCL 500 MG PO TABS: 500.0000 mg | ORAL_TABLET | Freq: Two times a day (BID) | ORAL | Status: DC

## 2015-06-10 NOTE — Progress Notes (Signed)
Patient ID: [name], female   DOB: 26-Jul-1960, 54 y.o.   MRN: [id number]   Subjective:    Patient ID: [name], female    DOB: January 20, 1961, 54 y.o.   MRN: [id number]  Patient presents for Dysuria  Pt here with right flank pain for the past few weeks, urinary frequency, no suprapubic pressure, no blood in urine. No fever. No Nausea vomiting,no change in IBS symptoms, loose some days then constipated  Last set of labs showed creatining of 1.22 due for recheck  Review Of Systems:  GEN- denies fatigue, fever, weight loss,weakness, recent illness HEENT- denies eye drainage, change in vision, nasal discharge, CVS- denies chest pain, palpitations RESP- denies SOB, cough, wheeze ABD- denies N/V, change in stools, abd pain GU- + dysuria, hematuria, dribbling, incontinence Neuro- denies headache, dizziness, syncope, seizure activity       Objective:    BP 126/68 mmHg  Pulse 80  Temp(Src) 98.2 F (36.8 C) (Oral)  Resp 14  Ht 5' 3"  (1.6 m)  Wt 124 lb (56.246 kg)  BMI 21.97 kg/m2 GEN- NAD, alert and oriented x3 CVS- RRR, no murmur RESP-CTAB ABD-NABS,soft,ND, no CVA tenderness, mild suprapubic tenderness Pulses- Radial  2+        Assessment & Plan:      Problem List Items Addressed This Visit    None    Visit Diagnoses    Acute renal insufficiency    -  Primary    Relevant Orders    Basic metabolic panel    Urinalysis, Routine w reflex microscopic (not at [facility])    Flank pain        Pt in office for > 1 hour, trying to urinate, state she has anxiety in public, will RX 3 days of antibiotics,she will return urine from home if no improvement       Note: This dictation was prepared with Dragon dictation along with smaller phrase technology. Any transcriptional errors that result from this process are unintentional.

## 2015-06-11 ENCOUNTER — Other Ambulatory Visit: Payer: 59

## 2015-06-11 LAB — BASIC METABOLIC PANEL
BUN: 18 mg/dL (ref 7–25)
CO2: 28 mmol/L (ref 20–31)
Calcium: 9.2 mg/dL (ref 8.6–10.4)
Chloride: 100 mmol/L (ref 98–110)
Creat: 0.78 mg/dL (ref 0.50–1.05)
Glucose, Bld: 79 mg/dL (ref 70–99)
Potassium: 4 mmol/L (ref 3.5–5.3)
Sodium: 139 mmol/L (ref 135–146)

## 2015-06-11 LAB — URINALYSIS, MICROSCOPIC ONLY
Casts: NONE SEEN [LPF]
Yeast: NONE SEEN [HPF]

## 2015-06-11 LAB — URINALYSIS, ROUTINE W REFLEX MICROSCOPIC
Bilirubin Urine: NEGATIVE
Glucose, UA: NEGATIVE
Hgb urine dipstick: NEGATIVE
Ketones, ur: NEGATIVE
Nitrite: NEGATIVE
Protein, ur: NEGATIVE
Specific Gravity, Urine: 1.025 (ref 1.001–1.035)
pH: 6 (ref 5.0–8.0)

## 2015-06-12 ENCOUNTER — Other Ambulatory Visit: Payer: Self-pay | Admitting: *Deleted

## 2015-06-12 MED ORDER — CIPROFLOXACIN HCL 500 MG PO TABS: 500.0000 mg | ORAL_TABLET | Freq: Two times a day (BID) | ORAL | Status: DC

## 2015-06-24 ENCOUNTER — Telehealth: Payer: Self-pay | Admitting: Family Medicine

## 2015-06-24 ENCOUNTER — Other Ambulatory Visit: Payer: 59

## 2015-06-24 DIAGNOSIS — R300 Dysuria: Secondary | ICD-10-CM

## 2015-06-24 LAB — URINALYSIS, MICROSCOPIC ONLY
Casts: NONE SEEN [LPF]
Crystals: NONE SEEN [HPF]
Yeast: NONE SEEN [HPF]

## 2015-06-24 LAB — URINALYSIS, ROUTINE W REFLEX MICROSCOPIC
Bilirubin Urine: NEGATIVE
Glucose, UA: NEGATIVE
Hgb urine dipstick: NEGATIVE
Ketones, ur: NEGATIVE
Nitrite: NEGATIVE
Protein, ur: NEGATIVE
Specific Gravity, Urine: 1.02 (ref 1.001–1.035)
pH: 7 (ref 5.0–8.0)

## 2015-06-24 NOTE — Telephone Encounter (Signed)
Call placed to patient and patient made aware per VM.  

## 2015-06-24 NOTE — Telephone Encounter (Signed)
Okay to leave sample, send for UA/ Urine Culture Before further treatment

## 2015-06-24 NOTE — Telephone Encounter (Signed)
Call placed to patient.   States that she has recurrent R sided kidney pain and intermittent lumbar/ flank pain.   States that she is able to come by office and bring sample today around 2:30pm if needed.   MD please advise.

## 2015-06-24 NOTE — Telephone Encounter (Signed)
Patient calling to say that the antibiotic that dr [name] prescribed is not working please call and advise  [phone]

## 2015-06-25 ENCOUNTER — Other Ambulatory Visit: Payer: Self-pay | Admitting: *Deleted

## 2015-06-25 MED ORDER — CEPHALEXIN 500 MG PO CAPS: 500.0000 mg | ORAL_CAPSULE | Freq: Four times a day (QID) | ORAL | Status: DC

## 2015-06-26 LAB — URINE CULTURE: Colony Count: 15000

## 2015-07-07 ENCOUNTER — Ambulatory Visit (INDEPENDENT_AMBULATORY_CARE_PROVIDER_SITE_OTHER): Payer: BLUE CROSS/BLUE SHIELD | Admitting: Psychiatry

## 2015-07-07 ENCOUNTER — Encounter (HOSPITAL_COMMUNITY): Payer: Self-pay | Admitting: Psychiatry

## 2015-07-07 VITALS — BP 140/87 | HR 62 | Ht 63.0 in | Wt 126.0 lb

## 2015-07-07 DIAGNOSIS — F411 Generalized anxiety disorder: Secondary | ICD-10-CM | POA: Diagnosis not present

## 2015-07-07 DIAGNOSIS — F332 Major depressive disorder, recurrent severe without psychotic features: Secondary | ICD-10-CM

## 2015-07-07 DIAGNOSIS — F900 Attention-deficit hyperactivity disorder, predominantly inattentive type: Secondary | ICD-10-CM | POA: Diagnosis not present

## 2015-07-07 MED ORDER — TRAZODONE HCL 50 MG PO TABS: 100.0000 mg | ORAL_TABLET | Freq: Every day | ORAL | Status: DC

## 2015-07-07 MED ORDER — FLUOXETINE HCL 20 MG PO CAPS
20.0000 mg | ORAL_CAPSULE | Freq: Every day | ORAL | Status: DC
Start: 2015-07-07 — End: 2015-09-22

## 2015-07-07 MED ORDER — METHYLPHENIDATE HCL 10 MG PO TABS: ORAL_TABLET | ORAL | Status: DC
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
## 2015-07-08 ENCOUNTER — Other Ambulatory Visit (HOSPITAL_COMMUNITY): Payer: Self-pay | Admitting: Psychiatry

## 2015-07-08 ENCOUNTER — Telehealth: Payer: Self-pay | Admitting: Family Medicine

## 2015-07-08 ENCOUNTER — Telehealth (HOSPITAL_COMMUNITY): Payer: Self-pay | Admitting: *Deleted

## 2015-07-08 ENCOUNTER — Telehealth: Payer: Self-pay | Admitting: *Deleted

## 2015-07-08 MED ORDER — DONEPEZIL HCL 5 MG PO TABS: 5.0000 mg | ORAL_TABLET | Freq: Every day | ORAL | Status: DC

## 2015-07-08 NOTE — Telephone Encounter (Signed)
Appointment scheduled.

## 2015-07-08 NOTE — Telephone Encounter (Signed)
Pt called stating she would like to get refills for her Aricept. Pt medication is off of her list. Every time pt comes to office, pt had a list that stated to stop medication the morning of and the night of the appt with Dr. Sima Matas before her testings. But did not realize she had it off her list. Pt number is [phone].

## 2015-07-08 NOTE — Telephone Encounter (Signed)
Refill sent.

## 2015-07-08 NOTE — Telephone Encounter (Signed)
Patient calling to say she is on her 2nd round of antibiotic and is still not better with the uti please call her at [phone]

## 2015-07-08 NOTE — Telephone Encounter (Signed)
Submitted referral to [facility] compass to Dr. Rosanne Ashing on 06/24/15 with referral number [id number]  Type of referral: consult and treat  Number of visits:6  Start Date: 06/24/15  End date: 12/23/15  Dx: R10.11- Right upper quadrant pain

## 2015-07-08 NOTE — Telephone Encounter (Signed)
noted 

## 2015-07-10 ENCOUNTER — Ambulatory Visit: Payer: Self-pay | Admitting: Family Medicine

## 2015-07-15 ENCOUNTER — Other Ambulatory Visit: Payer: Self-pay | Admitting: Family Medicine

## 2015-07-15 NOTE — Telephone Encounter (Signed)
Okay to refill give 2

## 2015-07-15 NOTE — Telephone Encounter (Signed)
Prescription sent to pharmacy.

## 2015-07-15 NOTE — Telephone Encounter (Signed)
Ok to refill 

## 2015-07-16 ENCOUNTER — Ambulatory Visit: Payer: Self-pay | Admitting: Physician Assistant

## 2015-07-16 ENCOUNTER — Telehealth: Payer: Self-pay | Admitting: *Deleted

## 2015-07-16 ENCOUNTER — Ambulatory Visit: Payer: BLUE CROSS/BLUE SHIELD | Admitting: Physician Assistant

## 2015-07-16 DIAGNOSIS — N390 Urinary tract infection, site not specified: Secondary | ICD-10-CM

## 2015-07-16 NOTE — Telephone Encounter (Signed)
Patient in office.   Reports that she continues to have urinary pain and pressure.   Advised that last urine cultures noted normal   Ok to refer to urology?

## 2015-07-17 ENCOUNTER — Ambulatory Visit (INDEPENDENT_AMBULATORY_CARE_PROVIDER_SITE_OTHER): Payer: BLUE CROSS/BLUE SHIELD | Admitting: Psychology

## 2015-07-17 DIAGNOSIS — F332 Major depressive disorder, recurrent severe without psychotic features: Secondary | ICD-10-CM | POA: Diagnosis not present

## 2015-07-17 DIAGNOSIS — F09 Unspecified mental disorder due to known physiological condition: Secondary | ICD-10-CM | POA: Diagnosis not present

## 2015-07-17 DIAGNOSIS — R413 Other amnesia: Secondary | ICD-10-CM | POA: Diagnosis not present

## 2015-07-17 NOTE — Telephone Encounter (Signed)
lmtrc to pt  

## 2015-07-17 NOTE — Telephone Encounter (Signed)
Send another round of Keflex 529m QID x 5 days I dont see a recent Urine Culture, did she leave a sample?  Okay to refer to urology

## 2015-07-20 MED ORDER — CEPHALEXIN 500 MG PO CAPS: 500.0000 mg | ORAL_CAPSULE | Freq: Four times a day (QID) | ORAL | Status: DC

## 2015-07-20 NOTE — Telephone Encounter (Signed)
Prescription sent to pharmacy.   Referral orders placed.   Call placed to patient and patient made aware.

## 2015-07-30 ENCOUNTER — Telehealth (HOSPITAL_COMMUNITY): Payer: Self-pay | Admitting: *Deleted

## 2015-08-03 ENCOUNTER — Telehealth: Payer: Self-pay | Admitting: Family Medicine

## 2015-08-12 ENCOUNTER — Ambulatory Visit (INDEPENDENT_AMBULATORY_CARE_PROVIDER_SITE_OTHER): Payer: BLUE CROSS/BLUE SHIELD | Admitting: Psychology

## 2015-08-12 ENCOUNTER — Ambulatory Visit (HOSPITAL_COMMUNITY): Payer: Self-pay | Admitting: Psychology

## 2015-08-12 DIAGNOSIS — F332 Major depressive disorder, recurrent severe without psychotic features: Secondary | ICD-10-CM

## 2015-08-12 DIAGNOSIS — F09 Unspecified mental disorder due to known physiological condition: Secondary | ICD-10-CM

## 2015-08-12 DIAGNOSIS — R413 Other amnesia: Secondary | ICD-10-CM | POA: Diagnosis not present

## 2015-08-14 ENCOUNTER — Other Ambulatory Visit: Payer: Self-pay | Admitting: Gastroenterology

## 2015-08-18 ENCOUNTER — Encounter (HOSPITAL_COMMUNITY): Payer: Self-pay | Admitting: *Deleted

## 2015-08-19 ENCOUNTER — Other Ambulatory Visit: Payer: Self-pay | Admitting: Family Medicine

## 2015-08-19 NOTE — Telephone Encounter (Signed)
Refill appropriate and filled per protocol. 

## 2015-08-21 ENCOUNTER — Ambulatory Visit (HOSPITAL_COMMUNITY)
Admission: RE | Admit: 2015-08-21 | Discharge: 2015-08-21 | Disposition: A | Discharge status: Home / Self Care | Payer: BLUE CROSS/BLUE SHIELD | Source: Ambulatory Visit | Attending: Gastroenterology | Admitting: Gastroenterology

## 2015-08-21 ENCOUNTER — Ambulatory Visit (HOSPITAL_COMMUNITY): Payer: BLUE CROSS/BLUE SHIELD | Admitting: Anesthesiology

## 2015-08-21 ENCOUNTER — Encounter (HOSPITAL_COMMUNITY)
Admission: RE | Disposition: A | Discharge status: Home / Self Care | Payer: Self-pay | Source: Ambulatory Visit | Attending: Gastroenterology

## 2015-08-21 ENCOUNTER — Encounter (HOSPITAL_COMMUNITY): Payer: Self-pay | Admitting: *Deleted

## 2015-08-21 DIAGNOSIS — Z8673 Personal history of transient ischemic attack (TIA), and cerebral infarction without residual deficits: Secondary | ICD-10-CM | POA: Diagnosis not present

## 2015-08-21 DIAGNOSIS — Z90710 Acquired absence of both cervix and uterus: Secondary | ICD-10-CM | POA: Diagnosis not present

## 2015-08-21 DIAGNOSIS — Z87891 Personal history of nicotine dependence: Secondary | ICD-10-CM | POA: Insufficient documentation

## 2015-08-21 DIAGNOSIS — Z9049 Acquired absence of other specified parts of digestive tract: Secondary | ICD-10-CM | POA: Diagnosis not present

## 2015-08-21 DIAGNOSIS — K219 Gastro-esophageal reflux disease without esophagitis: Secondary | ICD-10-CM | POA: Diagnosis not present

## 2015-08-21 DIAGNOSIS — M1990 Unspecified osteoarthritis, unspecified site: Secondary | ICD-10-CM | POA: Diagnosis not present

## 2015-08-21 DIAGNOSIS — F319 Bipolar disorder, unspecified: Secondary | ICD-10-CM | POA: Diagnosis not present

## 2015-08-21 DIAGNOSIS — G4730 Sleep apnea, unspecified: Secondary | ICD-10-CM | POA: Diagnosis not present

## 2015-08-21 DIAGNOSIS — R1011 Right upper quadrant pain: Secondary | ICD-10-CM | POA: Diagnosis not present

## 2015-08-21 DIAGNOSIS — I10 Essential (primary) hypertension: Secondary | ICD-10-CM | POA: Diagnosis not present

## 2015-08-21 DIAGNOSIS — E785 Hyperlipidemia, unspecified: Secondary | ICD-10-CM | POA: Insufficient documentation

## 2015-08-21 HISTORY — PX: EUS: SHX5427

## 2015-08-21 SURGERY — ESOPHAGEAL ENDOSCOPIC ULTRASOUND (EUS) RADIAL
Anesthesia: Monitor Anesthesia Care

## 2015-08-21 MED ORDER — LACTATED RINGERS IV SOLN
INTRAVENOUS | Status: DC
  Administered 2015-08-21: 1000 mL via INTRAVENOUS

## 2015-08-21 MED ORDER — PROPOFOL 500 MG/50ML IV EMUL
INTRAVENOUS | Status: DC | PRN
  Administered 2015-08-21: 75 ug/kg/min via INTRAVENOUS

## 2015-08-21 MED ORDER — PROPOFOL 10 MG/ML IV BOLUS
INTRAVENOUS | Status: AC
  Filled 2015-08-21: qty 20

## 2015-08-21 MED ORDER — SODIUM CHLORIDE 0.9 % IV SOLN: INTRAVENOUS | Status: DC

## 2015-08-21 MED ORDER — PROPOFOL 10 MG/ML IV BOLUS
INTRAVENOUS | Status: DC | PRN
  Administered 2015-08-21: 40 mg via INTRAVENOUS
  Administered 2015-08-21: 20 mg via INTRAVENOUS

## 2015-08-21 NOTE — Discharge Instructions (Signed)
Esophagogastroduodenoscopy, Care After Refer to this sheet in the next few weeks. These instructions provide you with information about caring for yourself after your procedure. Your health care provider may also give you more specific instructions. Your treatment has been planned according to current medical practices, but problems sometimes occur. Call your health care provider if you have any problems or questions after your procedure. WHAT TO EXPECT AFTER THE PROCEDURE After your procedure, it is typical to feel:  Soreness in your throat.  Pain with swallowing.  Sick to your stomach (nauseous).  Bloated.  Dizzy.  Fatigued. HOME CARE INSTRUCTIONS  Do not eat or drink anything until the numbing medicine (local anesthetic) has worn off and your gag reflex has returned. You will know that the local anesthetic has worn off when you can swallow comfortably.  Do not drive or operate machinery until directed by your health care provider.  Take medicines only as directed by your health care provider. SEEK MEDICAL CARE IF:   You cannot stop coughing.  You are not urinating at all or less than usual. SEEK IMMEDIATE MEDICAL CARE IF:  You have difficulty swallowing.  You cannot eat or drink.  You have worsening throat or chest pain.  You have dizziness or lightheadedness or you faint.  You have nausea or vomiting.  You have chills.  You have a fever.  You have severe abdominal pain.  You have black, tarry, or bloody stools.   This information is not intended to replace advice given to you by your health care provider. Make sure you discuss any questions you have with your health care provider.   Document Released: 06/06/2012 Document Revised: 07/11/2014 Document Reviewed: 06/06/2012 Elsevier Interactive Patient Education [employer].

## 2015-08-21 NOTE — Transfer of Care (Signed)
Immediate Anesthesia Transfer of Care Note  Patient: [name]  Procedure(s) Performed: Procedure(s): ESOPHAGEAL ENDOSCOPIC ULTRASOUND (EUS) RADIAL (N/A)  Patient Location: PACU  Anesthesia Type:MAC  Level of Consciousness:  sedated, patient cooperative and responds to stimulation  Airway & Oxygen Therapy:Patient Spontanous Breathing and Patient connected to face mask oxgen  Post-op Assessment:  Report given to PACU RN and Post -op Vital signs reviewed and stable  Post vital signs:  Reviewed and stable  Last Vitals:  Filed Vitals:   08/21/15 1110  BP: 176/96  Pulse: 69  Temp: 36.9 C  Resp: 12    Complications: No apparent anesthesia complications

## 2015-08-21 NOTE — Anesthesia Postprocedure Evaluation (Signed)
Anesthesia Post Note  Patient: [name]  Procedure(s) Performed: Procedure(s) (LRB): ESOPHAGEAL ENDOSCOPIC ULTRASOUND (EUS) RADIAL (N/A)  Patient location during evaluation: PACU Anesthesia Type: MAC Level of consciousness: awake and alert Pain management: pain level controlled Vital Signs Assessment: post-procedure vital signs reviewed and stable Respiratory status: spontaneous breathing, nonlabored ventilation, respiratory function stable and patient connected to nasal cannula oxygen Cardiovascular status: stable and blood pressure returned to baseline Anesthetic complications: no    Last Vitals:  Filed Vitals:   08/21/15 1330 08/21/15 1335  BP: 141/80   Pulse: 66 66  Temp:    Resp: 12 9    Last Pain:  Filed Vitals:   08/21/15 1335  PainSc: [place]

## 2015-08-21 NOTE — Op Note (Signed)
[facility] [place], [id number]   UPPER ENDOSCOPIC ULTRASOUND PROCEDURE REPORT     EXAM DATE: 08/21/2015  PATIENT NAME:          [name], [name]          MR#: [id number] BIRTHDATE:       08/02/1960     VISIT #:     [id number] ATTENDING:     Carol Ada, MD     STATUS:     outpatient ASSISTANT:      Tory Emerald and Lindaann Slough MD: ASA CLASS:        Class III INDICATIONS:  The patient is a 55 yr old female here for a lower endoscopic ultrasound due to RUQ pain. PROCEDURE PERFORMED:     Upper EUS  MEDICATIONS:     Monitored anesthesia care  CONSENT: The patient understands the risks and benefits of the procedure and understands that these risks include, but are not limited to: sedation, allergic reaction, infection, perforation and/or bleeding. Alternative means of evaluation and treatment include, among others: physical exam, x-rays, and/or surgical intervention. The patient elects to proceed with this endoscopic procedure.  DESCRIPTION OF PROCEDURE: During intra-op preparation period all mechanical & medical equipment was checked for proper function. Hand hygiene and appropriate measures for infection prevention was taken. After the risks, benefits and alternatives of the procedure were thoroughly explained, Informed consent was verified, confirmed and timeout was successfully executed by the treatment team. The patient was then placed in the left, lateral, decubitus position and IV sedation was administered. Throughout the procedure, the patients blood pressure, pulse and oxygen saturations were monitored continuously. Under direct visualization, the Pentax EUS Linear [id number] was introduced through the mouth and advanced to the second portion of the duodenum.  Water was used as necessary to provide an acoustic interface. The pulse, BP, and O2 saturation were monitored and documented by the physician and nursing  staff throughout the procedure. Upon completion of the imaging, water was removed and the patient was then discharged to recovery in stable condition with the appropriate post procedure care. Estimated blood loss is zero unless otherwise noted in this procedure report.  FINDINGS: The pancreatic parenchyma was normal as well as the PD caliber.  The PD was very small and difficult to measure.  The CBD was normal in size (4.3 mm) and there was no evidence of any stones.  No gallbladder was visualized, which is expected in her post cholecystectomy state.  No other abnormalities were identified.    ADVERSE EVENTS:     There were no immediate complications. IMPRESSIONS:     S/p cholecystectomy.  RECOMMENDATIONS:     Referral to Rheumatology.  ___________________________________ Carol Ada, MD eSigned:  Carol Ada, MD 08/21/2015 1:21 PM  cc:      PATIENT NAME:  [name], [name] MR#: [id number]

## 2015-08-21 NOTE — H&P (Signed)
[name] HPI: There is no change to her abdominal pain s/p lap chole. She denies any other significant pain in her body to suggest fibromyalgia, however, she has some mild tenderness in the left neck region and the right shoulder. As a result of the lap chole she has diarrhea, but she is not interested in cholestyrmaine for fear of constipation. She had two rectocele surgeries as a result of her constipation. The heating pad is in daily use, but there are times that it is not effective. No other GI symptoms.  Past Medical History  Diagnosis Date  . Hypertension     Mild; provoked by stress and anxiety  . Intracerebral bleed ([place])     No aneurysm; followed by Dr. Sherwood Gambler  . Hyperlipidemia   . GERD (gastroesophageal reflux disease)   . Depression   . Anxiety   . Chest pain 09/2011    Cardiac cath-normal coronaries  . Bipolar disorder ([place])   . Arthritis   . Carpal tunnel syndrome     Bilateral  . Constipation   . Hyperlipemia   . Stroke [facility]) 1999    hemorrhagic stroke; weakness of left side  . Sleep apnea     Stop Bang score of 4-No Sleep study done.    Past Surgical History  Procedure Laterality Date  . Abdominal hysterectomy    . Cervical fusion    . Rectocele repair      x2  . Cardiac catheterization    . Lipoma excision Left 11/18/2013    Procedure: EXCISION OF SOFT TISSUE MASS-LEFT THIGH;  Surgeon: Jamesetta So, MD;  Location: [facility] ORS;  Service: General;  Laterality: Left;  . Left heart catheterization with coronary angiogram N/A 09/23/2011    Procedure: LEFT HEART CATHETERIZATION WITH CORONARY ANGIOGRAM;  Surgeon: Thayer Headings, MD;  Location: [facility] CATH LAB;  Service: Cardiovascular;  Laterality: N/A;  . Brain surgery  1999    to remove blood clot after stroke   . Cholecystectomy N/A 10/14/2014    Procedure: LAPAROSCOPIC CHOLECYSTECTOMY WITH INTRAOPERATIVE CHOLANGIOGRAM;  Surgeon: Jackolyn Confer, MD;  Location: [facility] OR;  Service: General;  Laterality: N/A;     Family History  Problem Relation Age of Onset  . Coronary artery disease Paternal Grandfather   . Coronary artery disease Paternal Uncle   . Cancer Mother     breast   . Hypertension Mother   . Hyperlipidemia Mother   . Depression Mother   . Anxiety disorder Mother   . Drug abuse Sister   . Depression Cousin   . Drug abuse Cousin     Social History:  reports that she quit smoking about 17 years ago. Her smoking use included Cigarettes. She has a 19 pack-year smoking history. She has never used smokeless tobacco. She reports that she does not drink alcohol or use illicit drugs.  Allergies:  Allergies  Allergen Reactions  . Morphine And Related Hives  . Promethazine Hcl     Causes patient to become Hyper    Medications: Scheduled: Continuous:  No results found for this or any previous visit (from the past 24 hour(s)).   No results found.  ROS:  As stated above in the HPI otherwise negative.  There were no vitals taken for this visit.    PE: Gen: NAD, Alert and Oriented HEENT:  [place]/AT, EOMI Neck: Supple, no LAD Lungs: CTA Bilaterally CV: RRR without M/G/R ABM: Soft, RUQ pain, +BS Ext: No C/C/E  Assessment/Plan: 1) Chronic RUQ pain.  No response to the medrol dose pack or any other conservative measures.  I will check the biliary tract with an EUS.  Janautica Netzley D 08/21/2015, 7:11 AM

## 2015-08-21 NOTE — Anesthesia Preprocedure Evaluation (Addendum)
Anesthesia Evaluation  Patient identified by MRN, date of birth, ID band Patient awake    Reviewed: Allergy & Precautions, NPO status , Patient's Chart, lab work & pertinent test results  Airway Mallampati: III  TM Distance: >3 FB Neck ROM: Full    Dental  (+) Teeth Intact   Pulmonary sleep apnea , former smoker,    breath sounds clear to auscultation       Cardiovascular hypertension, Pt. on medications  Rhythm:Regular Rate:Normal     Neuro/Psych PSYCHIATRIC DISORDERS Anxiety Depression Bipolar Disorder  Neuromuscular disease CVA    GI/Hepatic Neg liver ROS, GERD  ,  Endo/Other  negative endocrine ROS  Renal/GU negative Renal ROS  negative genitourinary   Musculoskeletal  (+) Arthritis ,   Abdominal   Peds negative pediatric ROS (+)  Hematology negative hematology ROS (+)   Anesthesia Other Findings - HLD   Reproductive/Obstetrics negative OB ROS                           10/2014 EKG: normal sinus rhythm, nonspecific ST and T waves changes.   Anesthesia Physical Anesthesia Plan  ASA: II  Anesthesia Plan: MAC   Post-op Pain Management:    Induction: Intravenous  Airway Management Planned: Natural Airway  Additional Equipment:   Intra-op Plan:   Post-operative Plan:   Informed Consent: I have reviewed the patients History and Physical, chart, labs and discussed the procedure including the risks, benefits and alternatives for the proposed anesthesia with the patient or authorized representative who has indicated his/her understanding and acceptance.     Plan Discussed with: CRNA  Anesthesia Plan Comments:       Anesthesia Quick Evaluation

## 2015-08-24 ENCOUNTER — Encounter (HOSPITAL_COMMUNITY): Payer: Self-pay | Admitting: Gastroenterology

## 2015-08-31 ENCOUNTER — Telehealth (HOSPITAL_COMMUNITY): Payer: Self-pay | Admitting: *Deleted

## 2015-09-03 ENCOUNTER — Ambulatory Visit (INDEPENDENT_AMBULATORY_CARE_PROVIDER_SITE_OTHER): Payer: BLUE CROSS/BLUE SHIELD | Admitting: Psychology

## 2015-09-03 ENCOUNTER — Other Ambulatory Visit (HOSPITAL_COMMUNITY): Payer: Self-pay | Admitting: Psychiatry

## 2015-09-03 DIAGNOSIS — R413 Other amnesia: Secondary | ICD-10-CM

## 2015-09-03 DIAGNOSIS — F332 Major depressive disorder, recurrent severe without psychotic features: Secondary | ICD-10-CM | POA: Diagnosis not present

## 2015-09-03 DIAGNOSIS — F09 Unspecified mental disorder due to known physiological condition: Secondary | ICD-10-CM | POA: Diagnosis not present

## 2015-09-07 ENCOUNTER — Ambulatory Visit (HOSPITAL_COMMUNITY): Payer: Self-pay | Admitting: Psychiatry

## 2015-09-07 ENCOUNTER — Telehealth (HOSPITAL_COMMUNITY): Payer: Self-pay | Admitting: *Deleted

## 2015-09-08 ENCOUNTER — Ambulatory Visit: Payer: 59 | Admitting: Family Medicine

## 2015-09-08 ENCOUNTER — Telehealth (HOSPITAL_COMMUNITY): Payer: Self-pay | Admitting: *Deleted

## 2015-09-09 ENCOUNTER — Telehealth: Payer: Self-pay | Admitting: Family Medicine

## 2015-09-09 NOTE — Telephone Encounter (Signed)
Patient is calling to ask you a question about an mri  [phone] (H)

## 2015-09-09 NOTE — Telephone Encounter (Signed)
LMTRC

## 2015-09-10 ENCOUNTER — Telehealth (HOSPITAL_COMMUNITY): Payer: Self-pay | Admitting: *Deleted

## 2015-09-10 MED ORDER — ALPRAZOLAM 1 MG PO TABS: 1.0000 mg | ORAL_TABLET | Freq: Two times a day (BID) | ORAL | Status: DC

## 2015-09-10 NOTE — Telephone Encounter (Signed)
Per Dr. Harrington Challenger to call in pt Xanax into her pharmacy due to previous call. Called pt pharmacy and spoke with Ovid Curd and he showed understanding.

## 2015-09-10 NOTE — Telephone Encounter (Signed)
Called in pt medication and spoke with Ovid Curd

## 2015-09-10 NOTE — Telephone Encounter (Signed)
voice message from patient.   she needs refill of Xanax.

## 2015-09-10 NOTE — Telephone Encounter (Signed)
comepleted

## 2015-09-10 NOTE — Telephone Encounter (Signed)
Pt called stating she is out of her Xanax. Pt medication was last filled on 03-24-15- with 60 tablets 2 refills. Pt number is [phone]

## 2015-09-10 NOTE — Telephone Encounter (Signed)
Pt called back stating that she went to her Gastroenterologist Dr. Benson Norway with her abd pain and he stated that he thinks it is muscular/skeletal, does not think its rheum arthritis.  Pt is requesting MRI of complete back r side pain radiate from her ribs to her shoulders and has some tingle and numbness.  Pt states she recently had Gallbladder surgery and 2 endoscopies.  Please advise

## 2015-09-10 NOTE — Telephone Encounter (Signed)
You may call in one month supply, she will need appt

## 2015-09-11 ENCOUNTER — Encounter: Payer: Self-pay | Admitting: Family Medicine

## 2015-09-11 NOTE — Telephone Encounter (Signed)
She needs OV , I see no reason for MRI at this time

## 2015-09-11 NOTE — Telephone Encounter (Signed)
Pt aware of NTBS, pt already scheduled to come in on Tuesday march 14

## 2015-09-16 ENCOUNTER — Ambulatory Visit (HOSPITAL_COMMUNITY)
Admission: RE | Admit: 2015-09-16 | Discharge: 2015-09-16 | Disposition: A | Discharge status: Home / Self Care | Payer: BLUE CROSS/BLUE SHIELD | Source: Ambulatory Visit | Attending: Family Medicine | Admitting: Family Medicine

## 2015-09-16 ENCOUNTER — Ambulatory Visit (INDEPENDENT_AMBULATORY_CARE_PROVIDER_SITE_OTHER): Payer: BLUE CROSS/BLUE SHIELD | Admitting: Family Medicine

## 2015-09-16 ENCOUNTER — Encounter: Payer: Self-pay | Admitting: Family Medicine

## 2015-09-16 VITALS — BP 128/74 | HR 82 | Temp 98.2°F | Resp 14 | Ht 64.0 in | Wt 128.0 lb

## 2015-09-16 DIAGNOSIS — G8929 Other chronic pain: Secondary | ICD-10-CM | POA: Insufficient documentation

## 2015-09-16 DIAGNOSIS — R1010 Upper abdominal pain, unspecified: Secondary | ICD-10-CM | POA: Insufficient documentation

## 2015-09-16 DIAGNOSIS — M479 Spondylosis, unspecified: Secondary | ICD-10-CM | POA: Diagnosis not present

## 2015-09-16 DIAGNOSIS — M549 Dorsalgia, unspecified: Secondary | ICD-10-CM | POA: Diagnosis not present

## 2015-09-16 DIAGNOSIS — E785 Hyperlipidemia, unspecified: Secondary | ICD-10-CM

## 2015-09-16 DIAGNOSIS — I10 Essential (primary) hypertension: Secondary | ICD-10-CM

## 2015-09-16 DIAGNOSIS — R1011 Right upper quadrant pain: Secondary | ICD-10-CM

## 2015-09-16 LAB — LIPID PANEL
Cholesterol: 169 mg/dL (ref 125–200)
HDL: 74 mg/dL (ref >=46)
LDL Cholesterol: 69 mg/dL (ref <130)
Total CHOL/HDL Ratio: 2.3 Ratio (ref <=5.0)
Triglycerides: 130 mg/dL (ref <150)
VLDL: 26 mg/dL (ref <30)

## 2015-09-16 LAB — COMPREHENSIVE METABOLIC PANEL
ALT: 18 U/L (ref 6–29)
AST: 16 U/L (ref 10–35)
Albumin: 4.3 g/dL (ref 3.6–5.1)
Alkaline Phosphatase: 46 U/L (ref 33–130)
BUN: 17 mg/dL (ref 7–25)
CO2: 30 mmol/L (ref 20–31)
Calcium: 9.6 mg/dL (ref 8.6–10.4)
Chloride: 102 mmol/L (ref 98–110)
Creat: 0.99 mg/dL (ref 0.50–1.05)
Glucose, Bld: 84 mg/dL (ref 70–99)
Potassium: 4.1 mmol/L (ref 3.5–5.3)
Sodium: 142 mmol/L (ref 135–146)
Total Bilirubin: 0.7 mg/dL (ref 0.2–1.2)
Total Protein: 6.7 g/dL (ref 6.1–8.1)

## 2015-09-16 LAB — CBC WITH DIFFERENTIAL/PLATELET
Basophils Absolute: 0 10*3/uL (ref 0.0–0.1)
Basophils Relative: 0 % (ref 0–1)
Eosinophils Absolute: 0.1 10*3/uL (ref 0.0–0.7)
Eosinophils Relative: 2 % (ref 0–5)
HCT: 39.1 % (ref 36.0–46.0)
Hemoglobin: 13.4 g/dL (ref 12.0–15.0)
Lymphocytes Relative: 38 % (ref 12–46)
Lymphs Abs: 1.7 10*3/uL (ref 0.7–4.0)
MCH: 32.3 pg (ref 26.0–34.0)
MCHC: 34.3 g/dL (ref 30.0–36.0)
MCV: 94.2 fL (ref 78.0–100.0)
MPV: 9.3 fL (ref 8.6–12.4)
Monocytes Absolute: 0.3 10*3/uL (ref 0.1–1.0)
Monocytes Relative: 6 % (ref 3–12)
Neutro Abs: 2.4 10*3/uL (ref 1.7–7.7)
Neutrophils Relative %: 54 % (ref 43–77)
Platelets: 230 10*3/uL (ref 150–400)
RBC: 4.15 MIL/uL (ref 3.87–5.11)
RDW: 13.6 % (ref 11.5–15.5)
WBC: 4.5 10*3/uL (ref 4.0–10.5)

## 2015-09-16 MED ORDER — IBUPROFEN 600 MG PO TABS: 600.0000 mg | ORAL_TABLET | Freq: Two times a day (BID) | ORAL | Status: DC | PRN

## 2015-09-16 MED ORDER — TRAMADOL HCL 50 MG PO TABS: 50.0000 mg | ORAL_TABLET | Freq: Three times a day (TID) | ORAL | Status: DC | PRN

## 2015-09-16 NOTE — Patient Instructions (Signed)
Take the ultram as needed Cut back flexeril to once a day  Take the ibuprofen STOP MOBIC Get xray of your thoracic spine and ribs  F/U 4 months

## 2015-09-17 ENCOUNTER — Encounter: Payer: Self-pay | Admitting: Family Medicine

## 2015-09-17 ENCOUNTER — Encounter: Payer: Self-pay | Admitting: *Deleted

## 2015-09-17 NOTE — Assessment & Plan Note (Signed)
Well controlled,  Fasting labs done today

## 2015-09-17 NOTE — Progress Notes (Signed)
Patient ID: [name], female   DOB: Nov 24, 1960, 55 y.o.   MRN: [id number]    Subjective:    Patient ID: [name], female    DOB: 10/31/1960, 55 y.o.   MRN: [id number]  Patient presents for 4 month F/U   Patient here to follow-up. She is very upset that she continues to have right active pain and right upper quadrant pain. She points to her lumbar region and then points to her thoracic region said the radiates around she's been evaluated by gastroneurology they cannot find anything significant. She states they refer her to a rheumatologist but the rheumatologist reviewed and states there is nothing they can do to help her. Of note her MRI of her lumbar spine did not show anything significant causing any pain. She still followed by her psychiatrist but continues to have severe depression. Her medications were reviewed. She is also seeing a therapist. She still awaiting disability.   Review Of Systems:  GEN- denies fatigue, fever, weight loss,weakness, recent illness HEENT- denies eye drainage, change in vision, nasal discharge, CVS- denies chest pain, palpitations RESP- denies SOB, cough, wheeze ABD- denies N/V, change in stools, abd pain GU- denies dysuria, hematuria, dribbling, incontinence MSK- +joint pain, muscle aches, injury Neuro- denies headache, dizziness, syncope, seizure activity       Objective:    BP 128/74 mmHg  Pulse 82  Temp(Src) 98.2 F (36.8 C) (Oral)  Resp 14  Ht 5' 4"  (1.626 m)  Wt 128 lb (58.06 kg)  BMI 21.96 kg/m2 GEN- NAD, alert and oriented x3 HEENT- PERRL, EOMI, non injected sclera, pink conjunctiva, MMM, oropharynx clear Neck- Supple, no thyromegaly CVS- RRR, no murmur RESP-CTAB ABD-NABS,soft,NT,ND MSK- Mild TTP lower thoracic and lumbar region, fair ROM, mild TTP Right lower ribs - no bruising seen  EXT- No edema Pulses- Radial - 2+        Assessment & Plan:      Problem List Items Addressed This Visit    Hypertension    Well  controlled,  Fasting labs done today      Relevant Orders   CBC with Differential/Platelet (Completed)   Comprehensive metabolic panel (Completed)   Hyperlipidemia (Chronic)   Relevant Orders   Lipid panel (Completed)    Other Visit Diagnoses    Chronic RUQ pain    -  Primary    unknown cause, to significant GI cause, this is probably chronic MSK pain but pt very unwilling to accept this despite multiple doctors seeing her    Relevant Orders    DG Ribs Unilateral Right    Chronic back pain        Rib pain and now thoracic pain? After I told her MRI did not show anything signficant, recommended PT she refused, states nothing works. Denies any injury.  I will obtain xray of thoraic and ribs  Advised to stop Mobic , she requested ibuprofen states this helps some - will limit due to GERD issues  Given low dose Ultram Also advised to stop flexeril as this is not helping and she is taking upwards of TID     Relevant Medications    traMADol (ULTRAM) 50 MG tablet    ibuprofen (ADVIL,MOTRIN) 600 MG tablet    Other Relevant Orders    DG Thoracic Spine W/Swimmers       Note: This dictation was prepared with Dragon dictation along with smaller phrase technology. Any transcriptional errors that result from this process are unintentional.

## 2015-09-22 ENCOUNTER — Encounter (HOSPITAL_COMMUNITY): Payer: Self-pay | Admitting: Psychiatry

## 2015-09-22 ENCOUNTER — Ambulatory Visit (INDEPENDENT_AMBULATORY_CARE_PROVIDER_SITE_OTHER): Payer: BLUE CROSS/BLUE SHIELD | Admitting: Psychiatry

## 2015-09-22 VITALS — BP 148/98 | HR 74 | Ht 64.0 in | Wt 125.6 lb

## 2015-09-22 DIAGNOSIS — F332 Major depressive disorder, recurrent severe without psychotic features: Secondary | ICD-10-CM | POA: Diagnosis not present

## 2015-09-22 DIAGNOSIS — F411 Generalized anxiety disorder: Secondary | ICD-10-CM | POA: Diagnosis not present

## 2015-09-22 DIAGNOSIS — F900 Attention-deficit hyperactivity disorder, predominantly inattentive type: Secondary | ICD-10-CM | POA: Diagnosis not present

## 2015-09-22 MED ORDER — METHYLPHENIDATE HCL 10 MG PO TABS: ORAL_TABLET | ORAL | Status: DC

## 2015-09-22 MED ORDER — FLUOXETINE HCL 20 MG PO CAPS: 20.0000 mg | ORAL_CAPSULE | Freq: Every day | ORAL | Status: DC

## 2015-09-22 MED ORDER — TRAZODONE HCL 50 MG PO TABS: 100.0000 mg | ORAL_TABLET | Freq: Every day | ORAL | Status: DC

## 2015-09-22 MED ORDER — DONEPEZIL HCL 5 MG PO TABS: 5.0000 mg | ORAL_TABLET | Freq: Every day | ORAL | Status: DC

## 2015-09-22 MED ORDER — ALPRAZOLAM 1 MG PO TABS: 1.0000 mg | ORAL_TABLET | Freq: Two times a day (BID) | ORAL | Status: DC
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
## 2015-09-23 ENCOUNTER — Telehealth: Payer: Self-pay | Admitting: Family Medicine

## 2015-09-23 ENCOUNTER — Other Ambulatory Visit: Payer: Self-pay | Admitting: Family Medicine

## 2015-09-23 DIAGNOSIS — M549 Dorsalgia, unspecified: Principal | ICD-10-CM

## 2015-09-23 DIAGNOSIS — G8929 Other chronic pain: Secondary | ICD-10-CM

## 2015-09-23 NOTE — Telephone Encounter (Signed)
Pt stopped by the office requesting to be referred to Dr. Luna Glasgow in [place] for PT. She also states that the Tramadol made her blood pressure so high that she she was nauseated and vomiting. She does not wish to take any other medication for pain, she will take ibuprofen. She needs a copy of her last labs mailed to her home. Please call with any questions [phone]

## 2015-09-23 NOTE — Telephone Encounter (Signed)
This does not make sense, Dr. Luna Glasgow is orthopedics, he is not a Physical Therapist , what is she asking for.

## 2015-09-23 NOTE — Telephone Encounter (Signed)
Okay to refill, change directions to [facility]

## 2015-09-23 NOTE — Telephone Encounter (Signed)
Ok to refill 

## 2015-09-23 NOTE — Telephone Encounter (Signed)
Patient labs mailed to patient on 09/18/2014.  Ok to refer?

## 2015-09-23 NOTE — Telephone Encounter (Signed)
Prescription sent to pharmacy.

## 2015-09-24 NOTE — Telephone Encounter (Signed)
Orthopedics

## 2015-09-24 NOTE — Telephone Encounter (Signed)
Call placed to patient to inquire. LMTRC. 

## 2015-09-25 ENCOUNTER — Encounter (HOSPITAL_COMMUNITY): Payer: Self-pay | Admitting: Psychology

## 2015-09-25 NOTE — Telephone Encounter (Signed)
Call placed to patient and patient made aware.   Referral order placed.

## 2015-09-25 NOTE — Progress Notes (Signed)
The patient was assessed on both 1-13/2017 and 08/12/2015.  The patient was administered a neuropsychological test battery consisting of the Wechsler Adult Intelligence Scale-III and the Wechsler Memory Scale-III.  Behavioral observations suggest that the patient had some difficulty organizing her thoughts and not getting distracted by external issues. However, during each individual measure the patient did appear to be trying her hardest.  This does appear to be a valid assessment and the patient did appear to try her hardest.  While the patient was very anxious about doing as well as she  could she adapted to the testing situation fairly well and this does appear to be a fair and valid sample of her current functioning.  GENERAL INTELLECTUAL FUNCTIONING:  The patient's performance on the Weschler Adult Intellectual Scale-III, classifies her current intellectual abilities to be in the borderline Range, with a Full Scale IQ score of 76, a Verbal IQ score of 86, and a Performance IQ score of 69.  Overall, her performance places her at the fifth percentile with regard to her age-related peer group.  Analysis on Index Scores produced a Verbal Comprehension Index Score of 96, a Perceptual Organization Index Score of 70, a Working Memory Index Score of 73, and a Processing Speed Index Score of 63.   Overall, WAIS-III performance suggest that the patient's VIQ vs. PIQ were significantly different. There was a 17 point standard score difference between these 2 global indices suggesting significant deficits in the broad category of performance measures relying more heavily on visual spatial and mental processing speed measures..  Index scores suggest [place] significant difficulties in the areas of visual spatial abilities, working memory, and cognitive processing speed. The patient's verbal comprehension index score was a 96 which writer at the 39th percentile. This pattern of performance suggest an individual that was  functioning in the average range but is displaying some significant neuropsychological deficits related to visual spatial ability, attention and concentration, and information processing speed.    Below are the Age-Adjusted scores for each of the individual WAIS-III subtests.  Vocabulary:    11  Similarities:    8  Arithmetic:    5  Digit Span:    7  Information:    9  Comprehension:   6  Letter Number Sequencing:  5    Picture Completion:   5  Digit Symbol:    3  Block Design:    4  Matrix Reasoning:   6  Picture Arrangement:   7  Symbol Search:   2   WAIS-III analysis reveals a VIQ score of 86, which places her at the 18th  percentile.  Significant  scatter was noted in subtest performance.  The patient displayed no  significant strength relative to her age matched your group.  She did display a relative strength in the areas of vocabulary knowledge and general fund of information.  she performed in the average range on measures of vocabulary knowledge, verbal reasoning abilities, general fund of information. Mild impairments were noted with regard to basic/simple auditory encoding. Significant .  deficits were noted with regard to mathematical skills require attention concentration, social judgment and comprehension, and more complex auditory encoding and multiprocessing abilities.    WAIS-III analysis reveals a PIQ score of  69, which places her at the  second percentile.  There was significant scatter was seen on these measures.   The patient displayed  no significant cognitive strengths relative to her peers on these measures. Mild impairments were noted for  measures assessing visual sequencing abilities. More significant deficits were noted for measures assessing visual reasoning with severe deficits noted on measures of visual gestalt and visual attention, visual scanning and speed of mental operations.    Overall, WAIS-III performance was in the  significantly impaired (borderline)  range.  This pattern of strengths and weaknesses is globally consistent with  more profound deficits with regard to visual spatial abilities, attention/concentration and multiprocessing abilities, and the greatest and most significant deficits had to do with basic information processing speed deficits.   The patients performance on various subtests suggest  significant right sided cortical involvement in these deficits, which is consistent with her medical history of a cerebrovascular accident/injury in the right hemisphere of her y neocortex.     ATTENTION/CONCENTRATION AND MEMORY:  On the WMS-III, the patient obtained a Working Memory (Attention and Encoding)  Score of 69, which significantly below what would be predicted by her WAIS-III FSIQ of 76.   In fact, given the likelihood that her full scale IQ score is not indicative of her true premorbid intellectual functioning this working memory score is severely impaired based on other predicted measures. The most likely close as predicted for premorbid functioning would be her verbal comprehension index score which was 96.   In the chart below all be using standard procedure for predictive performance on these various memory task comparison to actual performance scores. However, it is important to note that a review of her full IQ measure is clearly reflective of significant deficits on neuropsychological items that would likely be mediated primarily by right hemisphere cortical functioning. There is clear indication in her subtests on her full IQ measure of focal neuropsychological deficits and therefore her full IQ score likely grossly underestimates her premorbid functioning. The patient produced a verbal comprehension score of 96 which is likely a much better predictor of premorbid functioning. Please keep this aspect and consideration with the interpretation of the scores.        Predicted based on FSIQ:  Actual Score  Working  Memory:    84    69 Immediate Memory:    86    86 Auditory Immediate:    86    89 Visual Immediate:    91    88 General Memory:    86    89 Auditory Delayed:    86    92 Visual Delayed:    90    88 Auditory Recognition Delayed:   88    100  Anterograde memory functions, as analyzed by the WMS-III, produced an Immediate Memory Index Score of   86, which is consistent what would be predicted by the [facility] of 76 (prediction was 86). There was no significant difference between the patient's performance on immediate auditory memory versus immediate visual memory abilities as she produced an Verbal Immediate Memory Index Score of 89 and a Visual Immediate Memory Index Score of 88. Analyses of Delayed Recall of previously presented information produce a General Memory Index Score of 89.  This performance is consistent with predicted levels.  There was nosignificant difference between delayed auditory memory versus delayed visual memory, as the patient produced an Auditory Delayed Index Score of 92 and a Visual Delayed Index Score of 88.    The patient showed good performance when memory assessment was changed from a free recall format to a recognition/acute recall format. Her performance on the auditory acute recall fell at 100 which is in the average range and consistent with  her predicted levels based on her verbal comprehension subtest score from her IQ test.  The patient's overall memory function is in fact doing considerably better than some other measures of cognitive performance assessing visual spatial abilities, attention/concentration/working memory abilities, and information processing speed. The patient's performance on almost all measures was consistent with predicted levels and only slightly below functioning levels predicted by what I consider to be a more trustworthy predictor based on her verbal comprehension score. In fact, the patient did quite well with regard to memory that was conducted using  qued recall format. Overall, this pattern is consistent with someone is memory is primarily in the lower end of the average range to average range with the exception of measures assessing auditory and visual encoding abilities.  CONCLUSIONS:    The overall results of these formal and objective measures of cognitive performance suggest significant neuropsychological deficits primarily related to indications of right cortical neurological deficits and insults. The patient showed severe deficits with regard to measures assessing her visual reasoning, visual organization, visual searching and visual scanning functions. The patient did a little bit better with regard to visual reasoning but this was still in the impaired range. The patient had profound deficits with regard to information processing as well as significant deficits with regard to auditory and visual encoding measures as well as deficits related to multi processing functioning. The patient performed in the average range with regard to verbal comprehension and understanding. Her memory functions both for visual and auditory sensory modalities were also at most only mildly impaired and there was no significant difference between these visual versus auditory modalities. The patient did show significant deficits on encoding but both immediate and delayed free recall formats were generally in the lower end of the average range. The patient's acute recall/memory functions were actually in the average range.  Overall, the results of this objective neuropsychological battery are consistent with residual neuropsychological deficits that are consistent with her medical history of a right hemispheric cerebrovascular event. While this event happened in 1999 there does clearly appear to be significant residual neuropsychological deficits. The patient does report that she was able to continue to work her job for some time after her initial recovery but this was  achieved with significant adaptations by both herself as well as her employer. While the patient maintains her general fund of information in her vocabulary/expressive language functioning (which is primarily mediated by the left cortical hemisphere) the patient clearly has difficulty with multi processing abilities, attention and concentration, information processing speed, and other visual spatial skills. The patient also continues to describe residual deficits for motor functioning on the left side of her body. While it is possible that some of these visual deficits could be related to her optical nerve damage I do think it is likely to be more related to deficits or injuries to the frontal eye fields of the right hemisphere of her brain rather than directly related to optical nerve damage. The patient was able to do fairly well with regard to visual memory measures and displayed no indications of any visual neglect.  These patterns of significant variability between verbal skills and visual spatial skills/speed of mental operations would suggest significant difficulties in workplace situations.

## 2015-09-25 NOTE — Progress Notes (Signed)
The patient was administered the Wechsler Adult Intelligence Scale. The patient appeared to fully participate and actively work on these various cognitive tasks to her full ability. However, administration and execution of this test procedure was quite slow and the patient easily got distracted and off task between measures. The plan had been to complete both the IQ testing and memory testing today but we were only able to complete the IQ testing. This does appear to be a valid assessment of the patient did appear to fully participate. Formal results will appear during the visit for feedback and reviewed.

## 2015-09-25 NOTE — Progress Notes (Signed)
Today we completed the Wechsler Memory Scale-III. The patient appeared to try her hardest and we were able to complete this measure and around an hour and a half. The rest of the half hour was used in scoring the previous IQ testing and the [employer] test. Formal results will appear in the visit set up for feedback and results.

## 2015-09-25 NOTE — Telephone Encounter (Signed)
Okay to send for Physical therapy in [place] for her chronic back pain

## 2015-09-25 NOTE — Telephone Encounter (Signed)
Patient returned call.   States t hat she does not want to see Dr. Luna Glasgow, but the PT services under [place] [facility] in [place] [facility]).

## 2015-10-05 ENCOUNTER — Emergency Department (HOSPITAL_COMMUNITY): Payer: BLUE CROSS/BLUE SHIELD

## 2015-10-05 ENCOUNTER — Encounter (HOSPITAL_COMMUNITY): Payer: Self-pay

## 2015-10-05 ENCOUNTER — Emergency Department (HOSPITAL_COMMUNITY)
Admission: EM | Admit: 2015-10-05 | Discharge: 2015-10-05 | Disposition: A | Discharge status: Home / Self Care | Payer: BLUE CROSS/BLUE SHIELD | Attending: Emergency Medicine | Admitting: Emergency Medicine

## 2015-10-05 DIAGNOSIS — I639 Cerebral infarction, unspecified: Secondary | ICD-10-CM | POA: Diagnosis not present

## 2015-10-05 DIAGNOSIS — I10 Essential (primary) hypertension: Secondary | ICD-10-CM | POA: Diagnosis not present

## 2015-10-05 DIAGNOSIS — Y999 Unspecified external cause status: Secondary | ICD-10-CM | POA: Diagnosis not present

## 2015-10-05 DIAGNOSIS — Z87891 Personal history of nicotine dependence: Secondary | ICD-10-CM | POA: Diagnosis not present

## 2015-10-05 DIAGNOSIS — Y92410 Unspecified street and highway as the place of occurrence of the external cause: Secondary | ICD-10-CM | POA: Diagnosis not present

## 2015-10-05 DIAGNOSIS — Z791 Long term (current) use of non-steroidal anti-inflammatories (NSAID): Secondary | ICD-10-CM | POA: Diagnosis not present

## 2015-10-05 DIAGNOSIS — E785 Hyperlipidemia, unspecified: Secondary | ICD-10-CM | POA: Insufficient documentation

## 2015-10-05 DIAGNOSIS — S32019A Unspecified fracture of first lumbar vertebra, initial encounter for closed fracture: Secondary | ICD-10-CM | POA: Insufficient documentation

## 2015-10-05 DIAGNOSIS — S161XXA Strain of muscle, fascia and tendon at neck level, initial encounter: Secondary | ICD-10-CM | POA: Diagnosis not present

## 2015-10-05 DIAGNOSIS — R079 Chest pain, unspecified: Secondary | ICD-10-CM | POA: Insufficient documentation

## 2015-10-05 DIAGNOSIS — S3992XA Unspecified injury of lower back, initial encounter: Secondary | ICD-10-CM | POA: Diagnosis present

## 2015-10-05 DIAGNOSIS — F319 Bipolar disorder, unspecified: Secondary | ICD-10-CM | POA: Diagnosis not present

## 2015-10-05 DIAGNOSIS — S32009A Unspecified fracture of unspecified lumbar vertebra, initial encounter for closed fracture: Secondary | ICD-10-CM

## 2015-10-05 DIAGNOSIS — Y9389 Activity, other specified: Secondary | ICD-10-CM | POA: Diagnosis not present

## 2015-10-05 DIAGNOSIS — Z79899 Other long term (current) drug therapy: Secondary | ICD-10-CM | POA: Diagnosis not present

## 2015-10-05 LAB — I-STAT CHEM 8, ED
BUN: 15 mg/dL (ref 6–20)
Calcium, Ion: 1.18 mmol/L (ref 1.12–1.23)
Chloride: 99 mmol/L — ABNORMAL LOW (ref 101–111)
Creatinine, Ser: 0.8 mg/dL (ref 0.44–1.00)
Glucose, Bld: 99 mg/dL (ref 65–99)
HCT: 38 % (ref 36.0–46.0)
Hemoglobin: 12.9 g/dL (ref 12.0–15.0)
Potassium: 3.7 mmol/L (ref 3.5–5.1)
Sodium: 142 mmol/L (ref 135–145)
TCO2: 29 mmol/L (ref 0–100)

## 2015-10-05 LAB — BASIC METABOLIC PANEL
Anion gap: 10 (ref 5–15)
BUN: 17 mg/dL (ref 6–20)
CO2: 29 mmol/L (ref 22–32)
Calcium: 9.4 mg/dL (ref 8.9–10.3)
Chloride: 103 mmol/L (ref 101–111)
Creatinine, Ser: 0.86 mg/dL (ref 0.44–1.00)
GFR calc Af Amer: >60 mL/min (ref >60)
GFR calc non Af Amer: >60 mL/min (ref >60)
Glucose, Bld: 113 mg/dL — ABNORMAL HIGH (ref 65–99)
Potassium: 3.7 mmol/L (ref 3.5–5.1)
Sodium: 142 mmol/L (ref 135–145)

## 2015-10-05 LAB — CBC WITH DIFFERENTIAL/PLATELET
Basophils Absolute: 0 10*3/uL (ref 0.0–0.1)
Basophils Relative: 0 %
Eosinophils Absolute: 0.1 10*3/uL (ref 0.0–0.7)
Eosinophils Relative: 1 %
HCT: 39.4 % (ref 36.0–46.0)
Hemoglobin: 13.8 g/dL (ref 12.0–15.0)
Lymphocytes Relative: 24 %
Lymphs Abs: 1.4 10*3/uL (ref 0.7–4.0)
MCH: 32.5 pg (ref 26.0–34.0)
MCHC: 35 g/dL (ref 30.0–36.0)
MCV: 92.9 fL (ref 78.0–100.0)
Monocytes Absolute: 0.4 10*3/uL (ref 0.1–1.0)
Monocytes Relative: 7 %
Neutro Abs: 3.9 10*3/uL (ref 1.7–7.7)
Neutrophils Relative %: 68 %
Platelets: 281 10*3/uL (ref 150–400)
RBC: 4.24 MIL/uL (ref 3.87–5.11)
RDW: 12.2 % (ref 11.5–15.5)
WBC: 5.8 10*3/uL (ref 4.0–10.5)

## 2015-10-05 LAB — URINALYSIS, ROUTINE W REFLEX MICROSCOPIC
Bilirubin Urine: NEGATIVE
Glucose, UA: NEGATIVE mg/dL
Hgb urine dipstick: NEGATIVE
Ketones, ur: NEGATIVE mg/dL
Leukocytes, UA: NEGATIVE
Nitrite: NEGATIVE
Protein, ur: NEGATIVE mg/dL
Specific Gravity, Urine: 1.01 (ref 1.005–1.030)
pH: 7.5 (ref 5.0–8.0)

## 2015-10-05 LAB — RAPID URINE DRUG SCREEN, HOSP PERFORMED
Amphetamines: NOT DETECTED
Barbiturates: NOT DETECTED
Benzodiazepines: POSITIVE — AB
Cocaine: NOT DETECTED
Opiates: NOT DETECTED
Tetrahydrocannabinol: NOT DETECTED

## 2015-10-05 MED ORDER — KETOROLAC TROMETHAMINE 30 MG/ML IJ SOLN
30.0000 mg | Freq: Once | INTRAMUSCULAR | Status: AC
  Administered 2015-10-05: 30 mg via INTRAVENOUS
  Filled 2015-10-05: qty 1

## 2015-10-05 MED ORDER — METHOCARBAMOL 500 MG PO TABS
500.0000 mg | ORAL_TABLET | Freq: Once | ORAL | Status: AC
  Administered 2015-10-05: 500 mg via ORAL
  Filled 2015-10-05: qty 1

## 2015-10-05 MED ORDER — IOPAMIDOL (ISOVUE-300) INJECTION 61%
100.0000 mL | Freq: Once | INTRAVENOUS | Status: AC | PRN
  Administered 2015-10-05: 100 mL via INTRAVENOUS

## 2015-10-05 MED ORDER — OXYCODONE-ACETAMINOPHEN 5-325 MG PO TABS
1.0000 | ORAL_TABLET | Freq: Once | ORAL | Status: AC
  Administered 2015-10-05: 1 via ORAL
  Filled 2015-10-05: qty 1

## 2015-10-05 MED ORDER — HYDROCODONE-ACETAMINOPHEN 5-325 MG PO TABS: ORAL_TABLET | ORAL | Status: DC

## 2015-10-05 MED ORDER — METHOCARBAMOL 500 MG PO TABS: 500.0000 mg | ORAL_TABLET | Freq: Three times a day (TID) | ORAL | Status: DC

## 2015-10-05 NOTE — ED Notes (Signed)
Pt in Radiology 
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
## 2015-10-05 NOTE — ED Notes (Signed)
Pt given crackers, peanut butter and water.

## 2015-10-05 NOTE — ED Notes (Signed)
[employer] to come to ED at this time to fit patient from [employer] TLSO back brace [phone].

## 2015-10-05 NOTE — ED Notes (Signed)
OK to remove cervical collar per Dr. Wyvonnia Dusky.

## 2015-10-05 NOTE — ED Notes (Signed)
Pt states she does not want more pain medicine until she can eat something.

## 2015-10-28 ENCOUNTER — Other Ambulatory Visit: Payer: Self-pay | Admitting: Family Medicine

## 2015-10-28 NOTE — Telephone Encounter (Signed)
Refill appropriate and filled per protocol. 

## 2015-10-29 ENCOUNTER — Other Ambulatory Visit: Payer: Self-pay | Admitting: Family Medicine

## 2015-10-29 NOTE — Telephone Encounter (Signed)
Refill appropriate and filled per protocol. 

## 2015-11-24 ENCOUNTER — Ambulatory Visit (INDEPENDENT_AMBULATORY_CARE_PROVIDER_SITE_OTHER): Payer: 59 | Admitting: Psychiatry

## 2015-11-24 ENCOUNTER — Encounter (HOSPITAL_COMMUNITY): Payer: Self-pay | Admitting: Psychiatry

## 2015-11-24 VITALS — BP 157/93 | HR 74 | Ht 63.0 in | Wt 129.0 lb

## 2015-11-24 DIAGNOSIS — F332 Major depressive disorder, recurrent severe without psychotic features: Secondary | ICD-10-CM

## 2015-11-24 DIAGNOSIS — F411 Generalized anxiety disorder: Secondary | ICD-10-CM

## 2015-11-24 MED ORDER — DONEPEZIL HCL 5 MG PO TABS: 5.0000 mg | ORAL_TABLET | Freq: Every day | ORAL | Status: DC

## 2015-11-24 MED ORDER — TRAZODONE HCL 50 MG PO TABS: 100.0000 mg | ORAL_TABLET | Freq: Every day | ORAL | Status: DC

## 2015-11-24 MED ORDER — FLUOXETINE HCL 20 MG PO CAPS: 20.0000 mg | ORAL_CAPSULE | Freq: Every day | ORAL | Status: DC

## 2015-11-24 MED ORDER — METHYLPHENIDATE HCL 10 MG PO TABS: ORAL_TABLET | ORAL | Status: DC

## 2015-11-24 MED ORDER — ALPRAZOLAM 1 MG PO TABS: 1.0000 mg | ORAL_TABLET | Freq: Two times a day (BID) | ORAL | Status: DC
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
## 2015-11-26 ENCOUNTER — Other Ambulatory Visit: Payer: Self-pay | Admitting: Family Medicine

## 2015-11-27 NOTE — Telephone Encounter (Signed)
Medication refilled per protocol. 

## 2015-12-04 ENCOUNTER — Other Ambulatory Visit: Payer: Self-pay | Admitting: Family Medicine

## 2015-12-04 NOTE — Telephone Encounter (Signed)
Refill appropriate and filled per protocol. 

## 2015-12-09 ENCOUNTER — Other Ambulatory Visit (HOSPITAL_COMMUNITY): Payer: Self-pay | Admitting: Neurosurgery

## 2015-12-09 DIAGNOSIS — M5414 Radiculopathy, thoracic region: Secondary | ICD-10-CM

## 2015-12-09 DIAGNOSIS — S32010A Wedge compression fracture of first lumbar vertebra, initial encounter for closed fracture: Secondary | ICD-10-CM

## 2015-12-14 ENCOUNTER — Telehealth (HOSPITAL_COMMUNITY): Payer: Self-pay | Admitting: *Deleted

## 2015-12-14 NOTE — Telephone Encounter (Signed)
noted 

## 2015-12-14 NOTE — Telephone Encounter (Signed)
Prior authorization for Methylphenidate received. Submitted online with cover my meds. Approved #[id number]. Called to notify pharmacy.

## 2015-12-21 ENCOUNTER — Ambulatory Visit (HOSPITAL_COMMUNITY): Admission: RE | Admit: 2015-12-21 | Payer: 59 | Source: Ambulatory Visit

## 2015-12-21 ENCOUNTER — Ambulatory Visit (HOSPITAL_COMMUNITY): Payer: 59

## 2015-12-29 ENCOUNTER — Other Ambulatory Visit (HOSPITAL_COMMUNITY): Payer: Self-pay | Admitting: Internal Medicine

## 2015-12-29 ENCOUNTER — Ambulatory Visit (HOSPITAL_COMMUNITY)
Admission: RE | Admit: 2015-12-29 | Discharge: 2015-12-29 | Disposition: A | Discharge status: Home / Self Care | Payer: 59 | Source: Ambulatory Visit | Attending: Neurosurgery | Admitting: Neurosurgery

## 2015-12-29 DIAGNOSIS — X58XXXA Exposure to other specified factors, initial encounter: Secondary | ICD-10-CM | POA: Diagnosis not present

## 2015-12-29 DIAGNOSIS — M5414 Radiculopathy, thoracic region: Secondary | ICD-10-CM

## 2015-12-29 DIAGNOSIS — Z780 Asymptomatic menopausal state: Secondary | ICD-10-CM

## 2015-12-29 DIAGNOSIS — M47894 Other spondylosis, thoracic region: Secondary | ICD-10-CM | POA: Diagnosis not present

## 2015-12-29 DIAGNOSIS — Z1231 Encounter for screening mammogram for malignant neoplasm of breast: Secondary | ICD-10-CM

## 2015-12-29 DIAGNOSIS — S32010A Wedge compression fracture of first lumbar vertebra, initial encounter for closed fracture: Secondary | ICD-10-CM | POA: Diagnosis not present

## 2016-01-07 ENCOUNTER — Ambulatory Visit (HOSPITAL_COMMUNITY)
Admission: RE | Admit: 2016-01-07 | Discharge: 2016-01-07 | Disposition: A | Discharge status: Home / Self Care | Payer: 59 | Source: Ambulatory Visit | Attending: Internal Medicine | Admitting: Internal Medicine

## 2016-01-07 DIAGNOSIS — Z1231 Encounter for screening mammogram for malignant neoplasm of breast: Secondary | ICD-10-CM

## 2016-01-07 DIAGNOSIS — M810 Age-related osteoporosis without current pathological fracture: Secondary | ICD-10-CM | POA: Diagnosis not present

## 2016-01-07 DIAGNOSIS — Z780 Asymptomatic menopausal state: Secondary | ICD-10-CM

## 2016-01-18 ENCOUNTER — Ambulatory Visit: Payer: BLUE CROSS/BLUE SHIELD | Admitting: Family Medicine

## 2016-01-21 ENCOUNTER — Ambulatory Visit (HOSPITAL_COMMUNITY): Payer: Self-pay | Admitting: Psychiatry

## 2016-01-22 ENCOUNTER — Ambulatory Visit (INDEPENDENT_AMBULATORY_CARE_PROVIDER_SITE_OTHER): Payer: 59 | Admitting: Psychiatry

## 2016-01-22 ENCOUNTER — Telehealth (HOSPITAL_COMMUNITY): Payer: Self-pay | Admitting: *Deleted

## 2016-01-22 ENCOUNTER — Encounter (HOSPITAL_COMMUNITY): Payer: Self-pay | Admitting: Psychiatry

## 2016-01-22 VITALS — BP 133/88 | HR 76 | Ht 63.0 in | Wt 128.0 lb

## 2016-01-22 DIAGNOSIS — F332 Major depressive disorder, recurrent severe without psychotic features: Secondary | ICD-10-CM

## 2016-01-22 MED ORDER — METHYLPHENIDATE HCL 10 MG PO TABS: ORAL_TABLET | ORAL | Status: DC

## 2016-01-22 MED ORDER — ALPRAZOLAM 1 MG PO TABS: 1.0000 mg | ORAL_TABLET | Freq: Three times a day (TID) | ORAL | Status: DC

## 2016-01-22 MED ORDER — FLUOXETINE HCL 20 MG PO CAPS: 20.0000 mg | ORAL_CAPSULE | Freq: Two times a day (BID) | ORAL | Status: DC

## 2016-01-22 MED ORDER — TRAZODONE HCL 50 MG PO TABS: 100.0000 mg | ORAL_TABLET | Freq: Every day | ORAL | Status: DC
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
## 2016-01-22 NOTE — Telephone Encounter (Signed)
Per pt before leaving office, she stated that she forgot to tell office that her new doctor put her on [place] and would like for that to be added to her med list.

## 2016-03-21 ENCOUNTER — Telehealth (HOSPITAL_COMMUNITY): Payer: Self-pay | Admitting: *Deleted

## 2016-03-21 NOTE — Telephone Encounter (Signed)
Called pt mobile and home number and lmtcb to verify that coming into office at 9:00am on 03-24-2016. Office number provided.

## 2016-03-24 ENCOUNTER — Ambulatory Visit (INDEPENDENT_AMBULATORY_CARE_PROVIDER_SITE_OTHER): Payer: 59 | Admitting: Psychiatry

## 2016-03-24 ENCOUNTER — Encounter (HOSPITAL_COMMUNITY): Payer: Self-pay | Admitting: Psychiatry

## 2016-03-24 VITALS — BP 145/92 | HR 83 | Ht 63.0 in | Wt 130.4 lb

## 2016-03-24 DIAGNOSIS — F332 Major depressive disorder, recurrent severe without psychotic features: Secondary | ICD-10-CM

## 2016-03-24 DIAGNOSIS — F900 Attention-deficit hyperactivity disorder, predominantly inattentive type: Secondary | ICD-10-CM

## 2016-03-24 DIAGNOSIS — F411 Generalized anxiety disorder: Secondary | ICD-10-CM | POA: Diagnosis not present

## 2016-03-24 MED ORDER — FLUOXETINE HCL 20 MG PO CAPS: 20.0000 mg | ORAL_CAPSULE | Freq: Every day | ORAL | 2 refills | Status: DC

## 2016-03-24 MED ORDER — ALPRAZOLAM 1 MG PO TABS: 1.0000 mg | ORAL_TABLET | Freq: Three times a day (TID) | ORAL | 2 refills | Status: DC

## 2016-03-24 MED ORDER — BREXPIPRAZOLE 1 MG PO TABS: 1.0000 mg | ORAL_TABLET | Freq: Every day | ORAL | 2 refills | Status: DC

## 2016-03-24 MED ORDER — TRAZODONE HCL 50 MG PO TABS: 100.0000 mg | ORAL_TABLET | Freq: Every day | ORAL | 3 refills | Status: DC
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
## 2016-03-29 ENCOUNTER — Telehealth (HOSPITAL_COMMUNITY): Payer: Self-pay | Admitting: *Deleted

## 2016-03-29 NOTE — Telephone Encounter (Signed)
Prior authorization for Rexulti received. Submitted online with cover my meds. Awaiting response. 

## 2016-03-30 NOTE — Telephone Encounter (Signed)
noted 

## 2016-03-30 NOTE — Telephone Encounter (Signed)
Called pt and informed her with progress on her medication

## 2016-04-21 ENCOUNTER — Ambulatory Visit (INDEPENDENT_AMBULATORY_CARE_PROVIDER_SITE_OTHER): Payer: 59 | Admitting: Psychiatry

## 2016-04-21 ENCOUNTER — Encounter (HOSPITAL_COMMUNITY): Payer: Self-pay | Admitting: Psychiatry

## 2016-04-21 VITALS — BP 132/91 | HR 73 | Ht 63.0 in | Wt 130.6 lb

## 2016-04-21 DIAGNOSIS — F332 Major depressive disorder, recurrent severe without psychotic features: Secondary | ICD-10-CM | POA: Diagnosis not present

## 2016-04-21 DIAGNOSIS — Z818 Family history of other mental and behavioral disorders: Secondary | ICD-10-CM | POA: Diagnosis not present

## 2016-04-21 DIAGNOSIS — Z813 Family history of other psychoactive substance abuse and dependence: Secondary | ICD-10-CM

## 2016-04-21 DIAGNOSIS — Z803 Family history of malignant neoplasm of breast: Secondary | ICD-10-CM | POA: Diagnosis not present

## 2016-04-21 MED ORDER — FLUOXETINE HCL 20 MG PO CAPS: 20.0000 mg | ORAL_CAPSULE | Freq: Every day | ORAL | 2 refills | Status: DC

## 2016-04-21 MED ORDER — BREXPIPRAZOLE 1 MG PO TABS: 1.0000 mg | ORAL_TABLET | Freq: Every day | ORAL | 2 refills | Status: DC

## 2016-04-21 MED ORDER — ALPRAZOLAM 1 MG PO TABS: 1.0000 mg | ORAL_TABLET | Freq: Three times a day (TID) | ORAL | 2 refills | Status: DC

## 2016-04-21 MED ORDER — TRAZODONE HCL 50 MG PO TABS: 100.0000 mg | ORAL_TABLET | Freq: Every day | ORAL | 3 refills | Status: DC
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
## 2016-04-22 ENCOUNTER — Telehealth (HOSPITAL_COMMUNITY): Payer: Self-pay | Admitting: *Deleted

## 2016-04-22 NOTE — Telephone Encounter (Signed)
Yes, please appeal. However from previous notes it looks like it was approved

## 2016-04-22 NOTE — Telephone Encounter (Signed)
Looked on Cover my meds for determination of Rexulti authorization. Medication was denied. Will inquire if MD would like appeal to be started.

## 2016-04-26 ENCOUNTER — Telehealth (HOSPITAL_COMMUNITY): Payer: Self-pay | Admitting: *Deleted

## 2016-04-26 NOTE — Telephone Encounter (Signed)
please appeal. However from previous notes it looks like it was approved

## 2016-04-26 NOTE — Telephone Encounter (Signed)
Pt walked in with an envelope and stated she had a delivery for Dr. Harrington Challenger. Per pt, she and Dr. Harrington Challenger had discussed during her previous appt about her getting denied for disability. Per pt, she was instructed by Dr. Harrington Challenger to bring in the denied disability paperwork for her to review and all the information in the minila envelope. Envelope was put in provider's box to review.

## 2016-04-26 NOTE — Telephone Encounter (Signed)
Ok, thanks.

## 2016-04-27 ENCOUNTER — Encounter (HOSPITAL_COMMUNITY): Payer: Self-pay | Admitting: Psychiatry

## 2016-04-28 ENCOUNTER — Telehealth (HOSPITAL_COMMUNITY): Payer: Self-pay | Admitting: *Deleted

## 2016-04-28 NOTE — Telephone Encounter (Signed)
Called for appeal of Rexulti, was told they will fax reasons for appeal and ways to get it approved within the next 10 minutes. Will wait for fax to call back for appeal once MD can review.

## 2016-04-29 NOTE — Telephone Encounter (Signed)
noted 

## 2016-04-29 NOTE — Telephone Encounter (Signed)
Denial letter faxed to [facility] requirements as to what provider office have to provide before pt Rexulti is approved.

## 2016-05-02 NOTE — Telephone Encounter (Signed)
Appeal was received and faxed to [facility]

## 2016-06-01 ENCOUNTER — Encounter: Payer: Self-pay | Admitting: Obstetrics and Gynecology

## 2016-06-01 ENCOUNTER — Ambulatory Visit (INDEPENDENT_AMBULATORY_CARE_PROVIDER_SITE_OTHER): Payer: 59 | Admitting: Obstetrics and Gynecology

## 2016-06-01 DIAGNOSIS — Z1211 Encounter for screening for malignant neoplasm of colon: Secondary | ICD-10-CM

## 2016-06-01 DIAGNOSIS — N895 Stricture and atresia of vagina: Secondary | ICD-10-CM

## 2016-06-01 DIAGNOSIS — Z01411 Encounter for gynecological examination (general) (routine) with abnormal findings: Secondary | ICD-10-CM

## 2016-06-01 NOTE — Progress Notes (Addendum)
Patient ID: [name], female   DOB: [birth year], 55 y.o.   MRN: [id number]  Assessment:  Annual Gyn Exam Vaginal stricture. s/p posterior repair  Plan:  1. pap smear not done s/p hysterectomy  2. return annually or prn 3    Annual mammogram advised 4. Send note to pts neurologist, Dr. Jannifer Franklin to discuss possibility of placing pt on low dose topical estrogen cream biweekly  5. F/u in 1 month   Subjective:   Chief Complaint  Patient presents with  . Gynecologic Exam     [name] is a 55 y.o. female No obstetric history on file. who presents for annual exam. No LMP recorded. Patient has had a hysterectomy.   Her chronic pain and medical conditions are managed by PCP Dr. Maudie Mercury, neurologist and psychiatrist. Pt expresses cynicism and skepticism regarding her care. She states she experiences diarrhea with nerves and anxiety, but is able to take Miralax with relief. She also complains of urinary urgency.   Pt reports extensive FHx of breast and ovarian cancer.   Discussion: 1. Discussed with pt risks and benefits of topical estrogen cream use vs posterior repair revision to alleviate dyspareunia. And vaginal discomfort.. Advised pt that oral estrogen is not recommended in her case due to h/o hemorrhagic stroke.   2. Discussed pts chronic medical conditions and concerns. She is very frustrated with her overall health. [name] mildly depressed after hemorrhagic stroke and multiple other rather significant health issues  At end of discussion, pt had opportunity to ask questions and has no further questions at this time.   Specific discussion of vaginal discomfort, chronic medical conditions as noted above. Greater than 50% was spent in counseling and coordination of care with the patient.   Total time greater than: 45 minutes.    The following portions of the patient's history were reviewed and updated as appropriate: allergies, current medications, past family history, past medical  history, past social history, past surgical history and problem list. Past Medical History:  Diagnosis Date  . Anxiety   . Arthritis   . Bipolar disorder ([place])   . Carpal tunnel syndrome    Bilateral  . Chest pain 09/2011   Cardiac cath-normal coronaries  . Constipation   . Depression   . GERD (gastroesophageal reflux disease)   . Hyperlipemia   . Hyperlipidemia   . Hypertension    Mild; provoked by stress and anxiety  . IBS (irritable bowel syndrome)   . Intracerebral bleed ([place])    No aneurysm; followed by Dr. Sherwood Gambler  . Sleep apnea    Stop Bang score of 4-No Sleep study done.  . Stroke [facility]) 1999   hemorrhagic stroke; weakness of left side    Past Surgical History:  Procedure Laterality Date  . ABDOMINAL HYSTERECTOMY    . [place]   to remove blood clot after stroke   . CARDIAC CATHETERIZATION    . CERVICAL FUSION    . CHOLECYSTECTOMY N/A 10/14/2014   Procedure: LAPAROSCOPIC CHOLECYSTECTOMY WITH INTRAOPERATIVE CHOLANGIOGRAM;  Surgeon: Jackolyn Confer, MD;  Location: [facility];  Service: General;  Laterality: N/A;  . EUS N/A 08/21/2015   Procedure: ESOPHAGEAL ENDOSCOPIC ULTRASOUND (EUS) RADIAL;  Surgeon: Carol Ada, MD;  Location: [facility];  Service: Endoscopy;  Laterality: N/A;  . LEFT HEART CATHETERIZATION WITH CORONARY ANGIOGRAM N/A 09/23/2011   Procedure: LEFT HEART CATHETERIZATION WITH CORONARY ANGIOGRAM;  Surgeon: Thayer Headings, MD;  Location: [facility] CATH LAB;  Service: Cardiovascular;  Laterality: N/A;  .  LIPOMA EXCISION Left 11/18/2013   Procedure: EXCISION OF SOFT TISSUE MASS-LEFT THIGH;  Surgeon: Jamesetta So, MD;  Location: [facility];  Service: General;  Laterality: Left;  . RECTOCELE REPAIR     x2     Current Outpatient Prescriptions:  .  acetaminophen (TYLENOL) 500 MG tablet, Take 500 mg by mouth every 6 (six) hours as needed for moderate pain. , Disp: , Rfl:  .  ALPRAZolam (XANAX) 1 MG tablet, Take 1 tablet (1 mg total) by mouth 3 (three) times  daily. (Patient taking differently: Take 1 mg by mouth 2 (two) times daily. ), Disp: 90 tablet, Rfl: 2 .  Ascorbic Acid (VITAMIN C) 1000 MG tablet, Take 1,000 mg by mouth daily., Disp: , Rfl:  .  B Complex-C (SUPER B COMPLEX PO), Take 1 tablet by mouth daily. , Disp: , Rfl:  .  COD LIVER OIL PO, Take by mouth at bedtime., Disp: , Rfl:  .  Evening Primrose Oil CAPS, Take 1 capsule by mouth daily., Disp: , Rfl:  .  FLUoxetine (PROZAC) 20 MG capsule, Take 1 capsule (20 mg total) by mouth daily., Disp: 30 capsule, Rfl: 2 .  hydrochlorothiazide (HYDRODIURIL) 25 MG tablet, TAKE 1 TABLET BY MOUTH DAILY., Disp: 90 tablet, Rfl: 0 .  HYDROcodone-acetaminophen (NORCO/VICODIN) 5-325 MG tablet, Take one-two tabs po q 4-6 hrs prn pain, Disp: 20 tablet, Rfl: 0 .  methocarbamol (ROBAXIN) 500 MG tablet, Take 1 tablet (500 mg total) by mouth 3 (three) times daily., Disp: 21 tablet, Rfl: 0 .  Multiple Vitamin (MULITIVITAMIN WITH MINERALS) TABS, Take 1 tablet by mouth daily., Disp: , Rfl:  .  nystatin (MYCOSTATIN) powder, APPLY FOUR TIMES DAILY AS NEEDED. (Patient taking differently: APPLY FOUR TIMES DAILY AS NEEDED yeast rash), Disp: 60 g, Rfl: 0 .  polyethylene glycol powder (GLYCOLAX/MIRALAX) powder, Take 17 g by mouth daily. (Patient taking differently: Take 17 g by mouth daily as needed for mild constipation. ), Disp: 3350 g, Rfl: 6 .  Potassium 99 MG TABS, Take 1 tablet by mouth daily., Disp: , Rfl:  .  simvastatin (ZOCOR) 20 MG tablet, TAKE ONE TABLET BY MOUTH ONCE DAILY AT BEDTIME, Disp: 30 tablet, Rfl: 6 .  traZODone (DESYREL) 50 MG tablet, Take 2 tablets (100 mg total) by mouth at bedtime., Disp: 60 tablet, Rfl: 3 .  Brexpiprazole (REXULTI) 1 MG TABS, Take 1 tablet (1 mg total) by mouth daily. (Patient not taking: Reported on 06/01/2016), Disp: 30 tablet, Rfl: 2 .  dicyclomine (BENTYL) 20 MG tablet, Take 20 mg by mouth 4 (four) times daily., Disp: , Rfl:   Review of Systems Constitutional: self described  low pain tolerance  Gastrointestinal: negative Genitourinary: urinary urgency   Objective:  BP (!) 152/100   Pulse 93   Ht 5' 2"  (1.575 m)   Wt 130 lb 3.2 oz (59.1 kg)   BMI 23.81 kg/m    BMI: Body mass index is 23.81 kg/m.  General Appearance: Alert, appropriate appearance for age. No acute distress HEENT: Grossly normal Neck / Thyroid: normal Cardiovascular: RRR; normal S1, S2, no murmur Lungs: CTA bilaterally Back: No CVAT Breast Exam: No masses or nodes.No dimpling, nipple retraction or discharge. Gastrointestinal: Soft, non-tender, no masses or organomegaly Pelvic Exam:  External genitalia: normal general appearance Vaginal: normal mucosa without prolapse or lesions. Left side of the posterior wall is not particularly tender, there is a small dimple on the left side that is moderately tender. Top of the vagina is shortened. Small  amount of brownish discharge present.  Rectovaginal: normal rectal, no masses and guaiac negative stool obtained the vag tissues are atrophic , vaginal length relatively  Short s/ p hyst.posterior perineum barely allows 2 fb, perceived as sensitive by pt , who considers the exam generally tender. Lymphatic Exam: Non-palpable nodes in neck, clavicular, axillary, or inguinal regions  Skin: no rash or abnormalities Neurologic: Normal gait and speech, no tremor  Psychiatric: Alert and oriented, appropriate affect.  Urinalysis:Not done  Guaiac negative   Mallory Shirk. MD Pgr [phone] 11:51 AM    By signing my name below, I, Hansel Feinstein, attest that this documentation has been prepared under the direction and in the presence of Jonnie Kind, MD. Electronically Signed: Hansel Feinstein, ED Scribe. 06/01/16. 11:51 AM.  I personally performed the services described in this documentation, which was SCRIBED in my presence. The recorded information has been reviewed and considered accurate. It has been edited as necessary during review. Jonnie Kind,  MD

## 2016-06-06 ENCOUNTER — Ambulatory Visit (INDEPENDENT_AMBULATORY_CARE_PROVIDER_SITE_OTHER): Payer: 59 | Admitting: Psychiatry

## 2016-06-06 ENCOUNTER — Encounter (HOSPITAL_COMMUNITY): Payer: Self-pay | Admitting: Psychiatry

## 2016-06-06 VITALS — BP 140/86 | HR 62 | Ht 62.0 in | Wt 127.8 lb

## 2016-06-06 DIAGNOSIS — Z8249 Family history of ischemic heart disease and other diseases of the circulatory system: Secondary | ICD-10-CM

## 2016-06-06 DIAGNOSIS — Z813 Family history of other psychoactive substance abuse and dependence: Secondary | ICD-10-CM

## 2016-06-06 DIAGNOSIS — F411 Generalized anxiety disorder: Secondary | ICD-10-CM | POA: Diagnosis not present

## 2016-06-06 DIAGNOSIS — Z79899 Other long term (current) drug therapy: Secondary | ICD-10-CM

## 2016-06-06 DIAGNOSIS — Z818 Family history of other mental and behavioral disorders: Secondary | ICD-10-CM

## 2016-06-06 DIAGNOSIS — Z8349 Family history of other endocrine, nutritional and metabolic diseases: Secondary | ICD-10-CM

## 2016-06-06 DIAGNOSIS — F332 Major depressive disorder, recurrent severe without psychotic features: Secondary | ICD-10-CM

## 2016-06-06 DIAGNOSIS — F909 Attention-deficit hyperactivity disorder, unspecified type: Secondary | ICD-10-CM | POA: Diagnosis not present

## 2016-06-06 MED ORDER — METHYLPHENIDATE HCL 10 MG PO TABS: 10.0000 mg | ORAL_TABLET | ORAL | 0 refills | Status: DC

## 2016-06-06 MED ORDER — FLUOXETINE HCL 20 MG PO CAPS: 20.0000 mg | ORAL_CAPSULE | Freq: Every day | ORAL | 2 refills | Status: DC

## 2016-06-06 MED ORDER — TRAZODONE HCL 50 MG PO TABS: 100.0000 mg | ORAL_TABLET | Freq: Every day | ORAL | 3 refills | Status: DC

## 2016-06-06 MED ORDER — ARIPIPRAZOLE 2 MG PO TABS: 2.0000 mg | ORAL_TABLET | Freq: Every day | ORAL | 2 refills | Status: DC

## 2016-06-06 MED ORDER — METHYLPHENIDATE HCL 10 MG PO TABS: 10.0000 mg | ORAL_TABLET | Freq: Every day | ORAL | 0 refills | Status: DC

## 2016-06-06 MED ORDER — ALPRAZOLAM 1 MG PO TABS: 1.0000 mg | ORAL_TABLET | Freq: Three times a day (TID) | ORAL | 2 refills | Status: DC
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
## 2016-06-07 ENCOUNTER — Telehealth: Payer: Self-pay | Admitting: Obstetrics and Gynecology

## 2016-06-07 MED ORDER — ESTROGENS, CONJUGATED 0.625 MG/GM VA CREA
0.5000 g | TOPICAL_CREAM | VAGINAL | 12 refills | Status: DC
Start: 2016-06-09 — End: 2017-04-12

## 2016-06-07 NOTE — Telephone Encounter (Signed)
-----   Message from Kathrynn Ducking, MD sent at 06/03/2016  9:23 AM EST ----- I'm sorry I did not get back to you sooner, your message ended up in an unusual place on my in basket, with the personal reminders.  Upon review of this patient's medical records, the frontal lobe event in 1999 appear to be a primary hemorrhage, the source was never determined. This does not appear to be associated with a thrombotic event. Probably okay to use topical estrogen treatments in this patient.

## 2016-06-07 NOTE — Telephone Encounter (Signed)
Dr Jannifer Franklin is okay with vaginal estrogen. Pt aware. Rx escribed for biweekly [place].e

## 2016-06-29 ENCOUNTER — Ambulatory Visit (INDEPENDENT_AMBULATORY_CARE_PROVIDER_SITE_OTHER): Payer: 59 | Admitting: Obstetrics and Gynecology

## 2016-06-29 ENCOUNTER — Encounter: Payer: Self-pay | Admitting: Obstetrics and Gynecology

## 2016-06-29 VITALS — BP 130/79 | HR 88 | Wt 132.0 lb

## 2016-06-29 DIAGNOSIS — N941 Unspecified dyspareunia: Secondary | ICD-10-CM | POA: Diagnosis not present

## 2016-06-29 DIAGNOSIS — R1031 Right lower quadrant pain: Secondary | ICD-10-CM | POA: Diagnosis not present

## 2016-06-29 DIAGNOSIS — N952 Postmenopausal atrophic vaginitis: Secondary | ICD-10-CM

## 2016-06-29 DIAGNOSIS — R1011 Right upper quadrant pain: Secondary | ICD-10-CM | POA: Diagnosis not present

## 2016-06-29 DIAGNOSIS — G8929 Other chronic pain: Secondary | ICD-10-CM

## 2016-06-29 DIAGNOSIS — M545 Low back pain: Secondary | ICD-10-CM
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
## 2016-07-14 ENCOUNTER — Ambulatory Visit (HOSPITAL_COMMUNITY): Payer: 59 | Attending: Obstetrics and Gynecology | Admitting: Physical Therapy

## 2016-07-14 DIAGNOSIS — G8929 Other chronic pain: Secondary | ICD-10-CM | POA: Diagnosis present

## 2016-07-14 DIAGNOSIS — R252 Cramp and spasm: Secondary | ICD-10-CM | POA: Diagnosis present

## 2016-07-14 DIAGNOSIS — M545 Low back pain: Secondary | ICD-10-CM | POA: Diagnosis not present

## 2016-07-14 DIAGNOSIS — R262 Difficulty in walking, not elsewhere classified: Secondary | ICD-10-CM | POA: Diagnosis present

## 2016-07-14 DIAGNOSIS — M6281 Muscle weakness (generalized): Secondary | ICD-10-CM | POA: Diagnosis present
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
## 2016-07-25 ENCOUNTER — Telehealth (HOSPITAL_COMMUNITY): Payer: Self-pay | Admitting: Physical Therapy

## 2016-07-25 ENCOUNTER — Ambulatory Visit (HOSPITAL_COMMUNITY): Payer: 59 | Admitting: Physical Therapy

## 2016-07-25 NOTE — Telephone Encounter (Signed)
No-show (#1) for this appointment. Called and spoke to patient, who apologizes as she forgot that it was today. She also reports she is actually getting therapy for something else at Dr. Brooke Bonito office and she is trying to have her insurance let her go there for her back as well.  Cancelled all future appointments, putting patient on hold, and patient reports she will let us know if she needs to come back to our clinic moving forward.    Deniece Ree PT, DPT [phone]

## 2016-08-01 ENCOUNTER — Ambulatory Visit (INDEPENDENT_AMBULATORY_CARE_PROVIDER_SITE_OTHER): Payer: 59 | Admitting: Obstetrics and Gynecology

## 2016-08-01 ENCOUNTER — Encounter: Payer: Self-pay | Admitting: Obstetrics and Gynecology

## 2016-08-01 VITALS — BP 126/82 | HR 65 | Wt 134.2 lb

## 2016-08-01 DIAGNOSIS — N952 Postmenopausal atrophic vaginitis: Secondary | ICD-10-CM | POA: Diagnosis not present

## 2016-08-01 DIAGNOSIS — N941 Unspecified dyspareunia: Secondary | ICD-10-CM
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
## 2016-08-04 ENCOUNTER — Other Ambulatory Visit (HOSPITAL_COMMUNITY): Payer: Self-pay | Admitting: Psychiatry

## 2016-08-05 ENCOUNTER — Encounter (HOSPITAL_COMMUNITY): Payer: Self-pay | Admitting: Psychiatry

## 2016-08-05 ENCOUNTER — Ambulatory Visit (INDEPENDENT_AMBULATORY_CARE_PROVIDER_SITE_OTHER): Payer: 59 | Admitting: Psychiatry

## 2016-08-05 VITALS — BP 136/84 | HR 83 | Ht 62.0 in | Wt 132.0 lb

## 2016-08-05 DIAGNOSIS — F332 Major depressive disorder, recurrent severe without psychotic features: Secondary | ICD-10-CM

## 2016-08-05 DIAGNOSIS — Z79899 Other long term (current) drug therapy: Secondary | ICD-10-CM | POA: Diagnosis not present

## 2016-08-05 DIAGNOSIS — Z8249 Family history of ischemic heart disease and other diseases of the circulatory system: Secondary | ICD-10-CM

## 2016-08-05 DIAGNOSIS — Z888 Allergy status to other drugs, medicaments and biological substances status: Secondary | ICD-10-CM | POA: Diagnosis not present

## 2016-08-05 DIAGNOSIS — Z803 Family history of malignant neoplasm of breast: Secondary | ICD-10-CM | POA: Diagnosis not present

## 2016-08-05 DIAGNOSIS — Z813 Family history of other psychoactive substance abuse and dependence: Secondary | ICD-10-CM

## 2016-08-05 MED ORDER — ARIPIPRAZOLE 2 MG PO TABS: 2.0000 mg | ORAL_TABLET | Freq: Every day | ORAL | 2 refills | Status: DC

## 2016-08-05 MED ORDER — METHYLPHENIDATE HCL 10 MG PO TABS: 10.0000 mg | ORAL_TABLET | ORAL | 0 refills | Status: DC

## 2016-08-05 MED ORDER — ALPRAZOLAM 1 MG PO TABS: 1.0000 mg | ORAL_TABLET | Freq: Three times a day (TID) | ORAL | 2 refills | Status: DC

## 2016-08-05 MED ORDER — FLUOXETINE HCL 20 MG PO CAPS: 20.0000 mg | ORAL_CAPSULE | Freq: Every day | ORAL | 2 refills | Status: DC

## 2016-08-05 MED ORDER — TRAZODONE HCL 50 MG PO TABS: 100.0000 mg | ORAL_TABLET | Freq: Every day | ORAL | 3 refills | Status: DC
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
## 2016-08-15 ENCOUNTER — Encounter (HOSPITAL_COMMUNITY): Payer: Self-pay | Admitting: Physical Therapy
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
## 2016-09-13 ENCOUNTER — Telehealth: Payer: Self-pay | Admitting: Obstetrics and Gynecology

## 2016-09-14 ENCOUNTER — Other Ambulatory Visit: Payer: Self-pay | Admitting: *Deleted

## 2016-09-14 DIAGNOSIS — G8929 Other chronic pain: Secondary | ICD-10-CM

## 2016-09-14 DIAGNOSIS — M5442 Lumbago with sciatica, left side: Principal | ICD-10-CM

## 2016-09-14 NOTE — Telephone Encounter (Signed)
Patient states she needs referral sent to [facility] physical therapy. Referral sent.

## 2016-10-18 ENCOUNTER — Other Ambulatory Visit (HOSPITAL_COMMUNITY): Payer: Self-pay | Admitting: Psychiatry

## 2016-10-20 ENCOUNTER — Other Ambulatory Visit (HOSPITAL_COMMUNITY): Payer: Self-pay | Admitting: Psychiatry

## 2016-10-31 ENCOUNTER — Ambulatory Visit (INDEPENDENT_AMBULATORY_CARE_PROVIDER_SITE_OTHER): Payer: 59 | Admitting: Psychiatry

## 2016-10-31 ENCOUNTER — Encounter (HOSPITAL_COMMUNITY): Payer: Self-pay | Admitting: Psychiatry

## 2016-10-31 VITALS — BP 115/80 | HR 71 | Ht 62.0 in | Wt 137.0 lb

## 2016-10-31 DIAGNOSIS — Z813 Family history of other psychoactive substance abuse and dependence: Secondary | ICD-10-CM

## 2016-10-31 DIAGNOSIS — F332 Major depressive disorder, recurrent severe without psychotic features: Secondary | ICD-10-CM | POA: Diagnosis not present

## 2016-10-31 DIAGNOSIS — F09 Unspecified mental disorder due to known physiological condition: Secondary | ICD-10-CM

## 2016-10-31 DIAGNOSIS — F411 Generalized anxiety disorder: Secondary | ICD-10-CM | POA: Diagnosis not present

## 2016-10-31 DIAGNOSIS — Z818 Family history of other mental and behavioral disorders: Secondary | ICD-10-CM | POA: Diagnosis not present

## 2016-10-31 DIAGNOSIS — Z79899 Other long term (current) drug therapy: Secondary | ICD-10-CM | POA: Diagnosis not present

## 2016-10-31 DIAGNOSIS — F900 Attention-deficit hyperactivity disorder, predominantly inattentive type: Secondary | ICD-10-CM | POA: Diagnosis not present

## 2016-10-31 MED ORDER — FLUOXETINE HCL 20 MG PO CAPS: 20.0000 mg | ORAL_CAPSULE | Freq: Every day | ORAL | 2 refills | Status: DC

## 2016-10-31 MED ORDER — METHYLPHENIDATE HCL 10 MG PO TABS: 10.0000 mg | ORAL_TABLET | ORAL | 0 refills | Status: DC

## 2016-10-31 MED ORDER — ALPRAZOLAM 1 MG PO TABS: 1.0000 mg | ORAL_TABLET | Freq: Three times a day (TID) | ORAL | 2 refills | Status: DC

## 2016-10-31 MED ORDER — ARIPIPRAZOLE 2 MG PO TABS: 2.0000 mg | ORAL_TABLET | Freq: Every day | ORAL | 2 refills | Status: DC

## 2016-10-31 MED ORDER — TRAZODONE HCL 100 MG PO TABS: 100.0000 mg | ORAL_TABLET | Freq: Every day | ORAL | 2 refills | Status: DC
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
## 2016-12-06 ENCOUNTER — Other Ambulatory Visit (HOSPITAL_COMMUNITY): Payer: Self-pay | Admitting: Internal Medicine

## 2016-12-06 DIAGNOSIS — Z1231 Encounter for screening mammogram for malignant neoplasm of breast: Secondary | ICD-10-CM

## 2016-12-29 ENCOUNTER — Other Ambulatory Visit (HOSPITAL_COMMUNITY): Payer: Self-pay | Admitting: Family Medicine

## 2016-12-29 DIAGNOSIS — R1011 Right upper quadrant pain: Secondary | ICD-10-CM

## 2017-01-09 ENCOUNTER — Ambulatory Visit (HOSPITAL_COMMUNITY): Payer: 59

## 2017-01-10 ENCOUNTER — Ambulatory Visit (HOSPITAL_COMMUNITY)
Admission: RE | Admit: 2017-01-10 | Discharge: 2017-01-10 | Disposition: A | Discharge status: Home / Self Care | Payer: 59 | Source: Ambulatory Visit | Attending: Family Medicine | Admitting: Family Medicine

## 2017-01-10 DIAGNOSIS — Z9049 Acquired absence of other specified parts of digestive tract: Secondary | ICD-10-CM | POA: Insufficient documentation

## 2017-01-10 DIAGNOSIS — R1011 Right upper quadrant pain: Secondary | ICD-10-CM | POA: Insufficient documentation

## 2017-01-11 ENCOUNTER — Ambulatory Visit (HOSPITAL_COMMUNITY): Payer: Self-pay

## 2017-01-16 ENCOUNTER — Ambulatory Visit (HOSPITAL_COMMUNITY)
Admission: RE | Admit: 2017-01-16 | Discharge: 2017-01-16 | Disposition: A | Discharge status: Home / Self Care | Payer: 59 | Source: Ambulatory Visit | Attending: Internal Medicine | Admitting: Internal Medicine

## 2017-01-16 DIAGNOSIS — Z1231 Encounter for screening mammogram for malignant neoplasm of breast: Secondary | ICD-10-CM | POA: Diagnosis present

## 2017-01-20 ENCOUNTER — Ambulatory Visit (HOSPITAL_COMMUNITY)
Admission: RE | Admit: 2017-01-20 | Discharge: 2017-01-20 | Disposition: A | Discharge status: Home / Self Care | Payer: 59 | Source: Ambulatory Visit | Attending: Family Medicine | Admitting: Family Medicine

## 2017-01-20 ENCOUNTER — Other Ambulatory Visit (HOSPITAL_COMMUNITY): Payer: Self-pay | Admitting: Family Medicine

## 2017-01-20 DIAGNOSIS — R52 Pain, unspecified: Secondary | ICD-10-CM

## 2017-01-20 DIAGNOSIS — M545 Low back pain: Secondary | ICD-10-CM | POA: Insufficient documentation

## 2017-01-23 ENCOUNTER — Ambulatory Visit (INDEPENDENT_AMBULATORY_CARE_PROVIDER_SITE_OTHER): Payer: 59 | Admitting: Psychiatry

## 2017-01-23 ENCOUNTER — Encounter (HOSPITAL_COMMUNITY): Payer: Self-pay | Admitting: Psychiatry

## 2017-01-23 VITALS — BP 100/70 | Ht 62.0 in | Wt 137.0 lb

## 2017-01-23 DIAGNOSIS — F332 Major depressive disorder, recurrent severe without psychotic features: Secondary | ICD-10-CM | POA: Diagnosis not present

## 2017-01-23 DIAGNOSIS — Z818 Family history of other mental and behavioral disorders: Secondary | ICD-10-CM

## 2017-01-23 DIAGNOSIS — Z813 Family history of other psychoactive substance abuse and dependence: Secondary | ICD-10-CM

## 2017-01-23 MED ORDER — METHYLPHENIDATE HCL 10 MG PO TABS
10.0000 mg | ORAL_TABLET | Freq: Two times a day (BID) | ORAL | 0 refills | Status: DC
Start: 2017-01-23 — End: 2017-03-21

## 2017-01-23 MED ORDER — ALPRAZOLAM 1 MG PO TABS: 1.0000 mg | ORAL_TABLET | Freq: Three times a day (TID) | ORAL | 2 refills | Status: DC

## 2017-01-23 MED ORDER — FLUOXETINE HCL 20 MG PO CAPS: 20.0000 mg | ORAL_CAPSULE | Freq: Every day | ORAL | 2 refills | Status: DC

## 2017-01-23 MED ORDER — METHYLPHENIDATE HCL 10 MG PO TABS: 10.0000 mg | ORAL_TABLET | Freq: Two times a day (BID) | ORAL | 0 refills | Status: DC

## 2017-01-23 MED ORDER — ARIPIPRAZOLE 2 MG PO TABS: 2.0000 mg | ORAL_TABLET | Freq: Every day | ORAL | 2 refills | Status: DC

## 2017-01-23 MED ORDER — TRAZODONE HCL 100 MG PO TABS: 100.0000 mg | ORAL_TABLET | Freq: Every day | ORAL | 2 refills | Status: DC
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
## 2017-02-06 ENCOUNTER — Telehealth: Payer: Self-pay | Admitting: Obstetrics and Gynecology

## 2017-02-06 NOTE — Telephone Encounter (Signed)
Patient called stating that she would like a call back from Dr. Glo Herring regarding the surgery they have discussed. Please contact pt

## 2017-02-07 NOTE — Telephone Encounter (Signed)
Pt called , will make an apt to discuss relaxing the stricture from last posterior repair for recurrent rectocele

## 2017-02-15 ENCOUNTER — Ambulatory Visit (INDEPENDENT_AMBULATORY_CARE_PROVIDER_SITE_OTHER): Payer: 59 | Admitting: Licensed Clinical Social Worker

## 2017-02-15 DIAGNOSIS — F332 Major depressive disorder, recurrent severe without psychotic features: Secondary | ICD-10-CM | POA: Diagnosis not present
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
## 2017-02-22 ENCOUNTER — Ambulatory Visit (INDEPENDENT_AMBULATORY_CARE_PROVIDER_SITE_OTHER): Payer: 59 | Admitting: Obstetrics and Gynecology

## 2017-02-22 ENCOUNTER — Encounter: Payer: Self-pay | Admitting: Obstetrics and Gynecology

## 2017-02-22 VITALS — BP 112/60 | HR 70 | Ht 63.75 in | Wt 152.0 lb

## 2017-02-22 DIAGNOSIS — N941 Unspecified dyspareunia: Secondary | ICD-10-CM

## 2017-02-22 DIAGNOSIS — N895 Stricture and atresia of vagina: Secondary | ICD-10-CM

## 2017-02-22 NOTE — Progress Notes (Signed)
Patient ID: [name], female   DOB: 06-02-1961, 56 y.o.   MRN: [id number] Preoperative History and Physical  [name] is a 56 y.o. No obstetric history on file. here for surgical management of repeat posterior rectocele. No significant preoperative concerns. Pt reports associated dyspareunia, vaginal bleeding with sexual intercourse and loose watery stools. Pt notes that she has tried premarin cream. Denies any other symptoms.   Proposed surgery: Release of vaginal stricture and posterior repair.  Past Medical History:  Diagnosis Date  . Anxiety   . Arthritis   . Bipolar disorder ([place])   . Carpal tunnel syndrome    Bilateral  . Chest pain 09/2011   Cardiac cath-normal coronaries  . Constipation   . Depression   . GERD (gastroesophageal reflux disease)   . Hyperlipemia   . Hyperlipidemia   . Hypertension    Mild; provoked by stress and anxiety  . IBS (irritable bowel syndrome)   . Intracerebral bleed ([place])    No aneurysm; followed by Dr. Sherwood Gambler  . Sleep apnea    Stop Bang score of 4-No Sleep study done.  . Stroke [facility]) 1999   hemorrhagic stroke; weakness of left side   Past Surgical History:  Procedure Laterality Date  . ABDOMINAL HYSTERECTOMY    . [place]   to remove blood clot after stroke   . CARDIAC CATHETERIZATION    . CERVICAL FUSION    . CHOLECYSTECTOMY N/A 10/14/2014   Procedure: LAPAROSCOPIC CHOLECYSTECTOMY WITH INTRAOPERATIVE CHOLANGIOGRAM;  Surgeon: Jackolyn Confer, MD;  Location: [place];  Service: General;  Laterality: N/A;  . EUS N/A 08/21/2015   Procedure: ESOPHAGEAL ENDOSCOPIC ULTRASOUND (EUS) RADIAL;  Surgeon: Carol Ada, MD;  Location: [facility];  Service: Endoscopy;  Laterality: N/A;  . LEFT HEART CATHETERIZATION WITH CORONARY ANGIOGRAM N/A 09/23/2011   Procedure: LEFT HEART CATHETERIZATION WITH CORONARY ANGIOGRAM;  Surgeon: Thayer Headings, MD;  Location: [facility];  Service: Cardiovascular;  Laterality: N/A;  . LIPOMA  EXCISION Left 11/18/2013   Procedure: EXCISION OF SOFT TISSUE MASS-LEFT THIGH;  Surgeon: Jamesetta So, MD;  Location: [facility];  Service: General;  Laterality: Left;  . RECTOCELE REPAIR     x2   OB History  No data available  Patient denies any other pertinent gynecologic issues.   Current Outpatient Prescriptions on File Prior to Visit  Medication Sig Dispense Refill  . acetaminophen (TYLENOL) 500 MG tablet Take 500 mg by mouth every 6 (six) hours as needed for moderate pain.     Marland Kitchen ALPRAZolam (XANAX) 1 MG tablet Take 1 tablet (1 mg total) by mouth 3 (three) times daily. 90 tablet 2  . ARIPiprazole (ABILIFY) 2 MG tablet Take 1 tablet (2 mg total) by mouth daily. 90 tablet 2  . Ascorbic Acid (VITAMIN C) 1000 MG tablet Take 1,000 mg by mouth daily.    . B Complex-C (SUPER B COMPLEX PO) Take 1 tablet by mouth daily.     . cetirizine (ZYRTEC) 10 MG tablet Take 10 mg by mouth daily.    . COD LIVER OIL PO Take by mouth at bedtime.    . Evening Primrose Oil CAPS Take 1 capsule by mouth daily.    Marland Kitchen FLUoxetine (PROZAC) 20 MG capsule Take 1 capsule (20 mg total) by mouth daily. 90 capsule 2  . HYDROcodone-acetaminophen (NORCO/VICODIN) 5-325 MG tablet Take one-two tabs po q 4-6 hrs prn pain 20 tablet 0  . lisinopril (PRINIVIL,ZESTRIL) 20 MG tablet     .  methylphenidate (RITALIN) 10 MG tablet Take 1 tablet (10 mg total) by mouth 2 (two) times daily with breakfast and lunch. (Patient taking differently: Take 10 mg by mouth daily. ) 60 tablet 0  . Multiple Vitamin (MULITIVITAMIN WITH MINERALS) TABS Take 1 tablet by mouth daily.    Marland Kitchen nystatin (MYCOSTATIN) powder APPLY FOUR TIMES DAILY AS NEEDED. (Patient taking differently: APPLY FOUR TIMES DAILY AS NEEDED yeast rash) 60 g 0  . polyethylene glycol powder (GLYCOLAX/MIRALAX) powder Take 17 g by mouth daily. (Patient taking differently: Take 17 g by mouth daily as needed for mild constipation. ) 3350 g 6  . Potassium 99 MG TABS Take 1 tablet by mouth daily.     . simvastatin (ZOCOR) 20 MG tablet TAKE ONE TABLET BY MOUTH ONCE DAILY AT BEDTIME 30 tablet 6  . traZODone (DESYREL) 100 MG tablet Take 1 tablet (100 mg total) by mouth at bedtime. 90 tablet 2  . conjugated estrogens (PREMARIN) vaginal cream Place 3.32 Applicatorfuls vaginally 2 (two) times a week. (Patient not taking: Reported on 02/22/2017) 42.5 g 12  . methocarbamol (ROBAXIN) 500 MG tablet Take 1 tablet (500 mg total) by mouth 3 (three) times daily. (Patient not taking: Reported on 02/22/2017) 21 tablet 0  . methylphenidate (RITALIN) 10 MG tablet Take 1 tablet (10 mg total) by mouth 2 (two) times daily with breakfast and lunch. (Patient not taking: Reported on 02/22/2017) 60 tablet 0   No current facility-administered medications on file prior to visit.    Allergies  Allergen Reactions  . Morphine And Related Hives  . Promethazine Hcl     Causes patient to become Hyper    Social History:   reports that she quit smoking about 19 years ago. Her smoking use included Cigarettes. She has a 19.00 pack-year smoking history. She has never used smokeless tobacco. She reports that she drinks alcohol. She reports that she does not use drugs.  Family History  Problem Relation Age of Onset  . Cancer Mother        breast   . Hypertension Mother   . Hyperlipidemia Mother   . Depression Mother   . Anxiety disorder Mother   . Drug abuse Sister   . Coronary artery disease Paternal Grandfather   . Coronary artery disease Paternal Uncle   . Depression Cousin   . Drug abuse Cousin     Review of Systems: Noncontributory  PHYSICAL EXAM: Blood pressure 112/60, pulse 70, height 5' 3.75" (1.619 m), weight 152 lb (68.9 kg). General appearance - alert, well appearing, and in no distress Chest - clear to auscultation, no wheezes, rales or rhonchi, symmetric air entry Heart - normal rate and regular rhythm Abdomen - soft, nontender, nondistended, no masses or organomegaly Pelvic - normal external  genitalia, vulva, vagina, cervix, uterus and adnexa,  VULVA: normal appearing vulva with no masses, tenderness or lesions,  VAGINA: Posterior wall stricture at the upper end of the prior posterior repair that interferes with intercourse. Below there there is still a small area of weakness in the posterior repair. normal appearing vagina with normal color and discharge, no lesions,  ADNEXA: normal adnexa in size, nontender and no masses,  Musculoskeletal: Current progressive curvature of spine due to breast weight.  Extremities - peripheral pulses normal, no pedal edema, no clubbing or cyanosis   Labs: No results found for this or any previous visit (from the past 336 hour(s)).  Imaging Studies: No results found.  Assessment: 1. Vaginal stricture s/p repair 2.  Recurrent rectocele 3. Shoulder and back pain secondary to breast discomfort and breast size.  4. Chronic yeast infection under breasts  Plan: Patient will undergo surgical management with release of vaginal stricture and posterior repair.  Give prescription for kenozole.  Referred to Dr. Waldron Session, MD 02/22/2017 12:29 PM  By signing my name below, I, Soijett Blue, attest that this documentation has been prepared under the direction and in the presence of Jonnie Kind, MD. Electronically Signed: Soijett Blue, ED Scribe. 02/22/17. 12:29 PM.  I personally performed the services described in this documentation, which was SCRIBED in my presence. The recorded information has been reviewed and considered accurate. It has been edited as necessary during review. Jonnie Kind, MD

## 2017-03-07 ENCOUNTER — Ambulatory Visit (INDEPENDENT_AMBULATORY_CARE_PROVIDER_SITE_OTHER): Payer: 59 | Admitting: Licensed Clinical Social Worker

## 2017-03-07 DIAGNOSIS — F332 Major depressive disorder, recurrent severe without psychotic features: Secondary | ICD-10-CM
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
## 2017-03-21 ENCOUNTER — Ambulatory Visit (INDEPENDENT_AMBULATORY_CARE_PROVIDER_SITE_OTHER): Payer: 59 | Admitting: Psychiatry

## 2017-03-21 ENCOUNTER — Encounter (HOSPITAL_COMMUNITY): Payer: Self-pay | Admitting: Psychiatry

## 2017-03-21 VITALS — BP 135/87 | HR 68 | Ht 62.0 in | Wt 144.2 lb

## 2017-03-21 DIAGNOSIS — G4700 Insomnia, unspecified: Secondary | ICD-10-CM | POA: Diagnosis not present

## 2017-03-21 DIAGNOSIS — Z813 Family history of other psychoactive substance abuse and dependence: Secondary | ICD-10-CM

## 2017-03-21 DIAGNOSIS — Z79899 Other long term (current) drug therapy: Secondary | ICD-10-CM

## 2017-03-21 DIAGNOSIS — F900 Attention-deficit hyperactivity disorder, predominantly inattentive type: Secondary | ICD-10-CM | POA: Diagnosis not present

## 2017-03-21 DIAGNOSIS — Z818 Family history of other mental and behavioral disorders: Secondary | ICD-10-CM

## 2017-03-21 DIAGNOSIS — F332 Major depressive disorder, recurrent severe without psychotic features: Secondary | ICD-10-CM

## 2017-03-21 DIAGNOSIS — F411 Generalized anxiety disorder: Secondary | ICD-10-CM

## 2017-03-21 DIAGNOSIS — R413 Other amnesia: Secondary | ICD-10-CM | POA: Diagnosis not present

## 2017-03-21 MED ORDER — TRAZODONE HCL 100 MG PO TABS: 100.0000 mg | ORAL_TABLET | Freq: Every day | ORAL | 2 refills | Status: DC

## 2017-03-21 MED ORDER — ARIPIPRAZOLE 2 MG PO TABS: 2.0000 mg | ORAL_TABLET | Freq: Every day | ORAL | 2 refills | Status: DC

## 2017-03-21 MED ORDER — ALPRAZOLAM 1 MG PO TABS: 1.0000 mg | ORAL_TABLET | Freq: Three times a day (TID) | ORAL | 2 refills | Status: DC

## 2017-03-21 MED ORDER — METHYLPHENIDATE HCL 10 MG PO TABS: 10.0000 mg | ORAL_TABLET | Freq: Two times a day (BID) | ORAL | 0 refills | Status: DC

## 2017-03-21 MED ORDER — FLUOXETINE HCL 20 MG PO CAPS: 20.0000 mg | ORAL_CAPSULE | Freq: Every day | ORAL | 2 refills | Status: DC
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
## 2017-03-28 ENCOUNTER — Ambulatory Visit (INDEPENDENT_AMBULATORY_CARE_PROVIDER_SITE_OTHER): Payer: 59 | Admitting: Licensed Clinical Social Worker

## 2017-03-28 DIAGNOSIS — F332 Major depressive disorder, recurrent severe without psychotic features: Secondary | ICD-10-CM

## 2017-03-28 NOTE — Progress Notes (Signed)
   THERAPIST PROGRESS NOTE  Session Time: 2:00 pm-2:40 pm  Participation Level: Active  Behavioral Response: CasualAlertDepressed  Type of Therapy: Individual Therapy  Treatment Goals addressed: Coping  Interventions: CBT and Solution Focused  Summary: [name] is a 56 y.o. female who presents oriented x5 (person, place, situation, time and object), alert, well groomed, appropriately dressed,  average height, average weight and cooperative to address mood and anxiety. Patient has a history of medical treatment including blood clot/stroke, and degenerative disc disease. Patient has a history of mental health treatment including outpatient therapy and medication management. Patient denies symptoms of mania. Patient admits to passive thoughts of suicide with no intent or means with her husband as well as her mother being a protective factor. Patient denies homicidal ideations. Patient denies psychosis including auditory and visual hallucinations. Patient denies a history of substance abuse. Patient has medical issues that impact her mood as well as her self confidence.  Patient had an overall score of 4.75 out of 10 on the Outcome Rating Scale which is a reduction of 1 since the last session. Patient reported that she painted 4 chairs instead of just one, she has continued to do her word cross but she forgot to follow up with the [facility] about joining. Patient reported also that she has been trying to keep a daily gratitude journal and list 5 things she is grateful for daily. Patient reported that she continues to be in chronic pain. She is tired of going to doctors and taking so much medication while still feeling pain. She feels like her pain is not being taken serious by her doctors. Patient reported that she expresses herself much better through writing and wants to start a journal to write out her feelings daily. Patient committed to continue to keep a daily gratitude journal and record her  feelings in a daily journal. Patient rated the session 9.75 out of 10 on the Session Rating Scale which is an increase of 2.  Patient engaged in session. She responded well to interventions. Patient continues to meet criteria for Major depressive disorder, recurrent, severe without psychotic features. Patient will continue in outpatient therapy due to being the least restrictive service to meet her needs. Patient made minimal progress on her goals at this time.   Suicidal/Homicidal: Negativewithout intent/plan  Therapist Response: Therapist reviewed patient's recent thoughts and behaviors. Therapist utilized CBT to address mood. Therapist followed up on patient's homework to paint chairs, do word cross, and follow up with joining the gym. Therapist processed patient's feelings to identify triggers for mood. Therapist assisted patient in identify ways to improve her mood. Therapist committed patient to continue to keep a gratitude journal and record her feelings in a daily journal. Therapist administered the Outcome Rating Scale and the Session Rating Scale.   Plan: Return again in 3 weeks. Therapist will review patient goals on or before 11.15.2018.  Diagnosis: Axis I: Major Depression, Recurrent severe    Axis II: No diagnosis    Glori Bickers, LCSW 03/28/2017

## 2017-03-29 NOTE — Patient Instructions (Signed)
[name]  03/29/2017     @PREFPERIOPPHARMACY @   Your procedure is scheduled on  04/04/2017   Report to [facility] at  33  A.M.  Call this number if you have problems the morning of surgery:  [phone]   Remember:  Do not eat food or drink liquids after midnight.  Take these medicines the morning of surgery with A SIP OF WATER  Xanax, abilify, zyrtec, prozac, hydrocodone, lisinopril, ritalin, protonix.   Do not wear jewelry, make-up or nail polish.  Do not wear lotions, powders, or perfumes, or deoderant.  Do not shave 48 hours prior to surgery.  Men may shave face and neck.  Do not bring valuables to the hospital.  [facility] is not responsible for any belongings or valuables.  Contacts, dentures or bridgework may not be worn into surgery.  Leave your suitcase in the car.  After surgery it may be brought to your room.  For patients admitted to the hospital, discharge time will be determined by your treatment team.  Patients discharged the day of surgery will not be allowed to drive home.   Name and phone number of your driver:   family Special instructions:  None  Please read over the following fact sheets that you were given. Anesthesia Post-op Instructions and Care and Recovery After Surgery       Anterior and Posterior Colporrhaphy Anterior or posterior colporrhaphy is surgery to fix a prolapse of organs in the genital tract. Prolapse means the falling down, bulging, dropping, or drooping of an organ. Organs that commonly prolapse include the rectum, bladder, vagina, and uterus. Prolapse can affect a single organ or several organs at the same time. This often worsens when women stop having their monthly periods (menopause) because estrogen loss weakens the muscles and tissues in the genital tract. In addition, prolapse happens when the organs are damaged or weakened. This commonly happens after childbirth and as a result of aging. Surgery is  often done for severe prolapses. The type of colporrhaphy done depends on the type of genital prolapse. Types of genital prolapse include the following:  Cystocele. This is a prolapse of the upper (anterior) wall of the vagina. The anterior wall bulges into the vagina and brings the bladder with it.  Rectocele. This is a prolapse of the lower (posterior) wall of the vagina. The posterior vaginal wall bulges into the vagina and brings the rectum with it.  Enterocele. This is a prolapse of part of the pelvic organs called the pouch of [name]. It also involves a portion of the small bowel. It appears as a bulge under the neck of the uterus at the top of the back wall of the vagina.  Procidentia. This is a complete prolapse of the uterus and the cervix. The prolapse can be seen and felt coming out of the vagina.  LET [facility] CARE PROVIDER KNOW ABOUT:  Any allergies you have.  All medicines you are taking, including vitamins, herbs, eye drops, creams, and over-the-counter medicines.  Previous problems you or members of your family have had with the use of anesthetics.  Any blood disorders you have.  Previous surgeries you have had.  Medical conditions you have.  Smoking history or history of alcohol use.  Possibility of pregnancy, if this applies. RISKS AND COMPLICATIONS Generally, anterior or posterior colporrhaphy is a safe procedure. However, as with any procedure, complications can occur. Possible complications include:  Infection.  Damage to other organs during surgery.  Bleeding after surgery.  Problems urinating.  Problems from the anesthetic.  BEFORE THE PROCEDURE  Ask your health care provider about changing or stopping your regular medicines.  Do not eat or drink anything for at least 8 hours before the surgery.  If you smoke, do not smoke for at least 2 weeks before the surgery.  Make plans to have someone drive you home after your hospital stay. Also,  arrange for someone to help you with activities during recovery. PROCEDURE You may be given medicine to help you relax before the surgery (sedative). During the surgery you will be given medicine to make you sleep through the procedure (general anesthetic) or medicine to numb you from the waist down (spinal anesthetic). This medicine will be given through an intravenous (IV) access tube that is put into one of your veins. The procedure will vary depending on the type of repair:  Anterior repair. A cut (incision) is made in the midline section of the front part of the vaginal wall. A triangular-shaped piece of vaginal tissue is removed, and the stronger, healthier tissue is sewn together in order to support and suspend the bladder.  Posterior repair. An incision is made midline on the back wall of the vagina. A triangular portion of vaginal skin is removed to expose the muscle. Excess tissue is removed, and stronger, healthier muscle and ligament tissue is sewn together to support the rectum.  Anterior and posterior repair. Both procedures are done during the same surgery.  What to expect after the procedure You will be taken to a recovery area. Your blood pressure, pulse, breathing, and temperature (vital signs) will be monitored. This is done until you are stable. Then you will be transferred to a hospital room. After surgery, you will have a small rubber tube in place to drain your bladder (urinary catheter). This will be in place for 2 to 7 days or until your bladder is working properly on its own. The IV access tube will be removed in 1 to 3 days. You may have a gauze packing in your vagina to prevent bleeding. This will be removed 2 or 3 days after the surgery. You will likely need to stay in the hospital for 3 to 5 days. This information is not intended to replace advice given to you by your health care provider. Make sure you discuss any questions you have with your health care  provider. Document Released: 09/10/2003 Document Revised: 11/26/2015 Document Reviewed: 11/09/2012 Elsevier Interactive Patient Education  2017 [place]. Anterior and Posterior Colporrhaphy, Care After Refer to this sheet in the next few weeks. These instructions provide you with information on caring for yourself after your procedure. Your health care provider may also give you more specific instructions. Your treatment has been planned according to current medical practices, but problems sometimes occur. Call your health care provider if you have any problems or questions after your procedure. Follow these instructions at home:  Take frequent rest periods throughout the day.  Only take over-the-counter or prescription medicines as directed by your health care provider.  Avoid strenuous activity such as heavy lifting (more than 10 pounds [4.5 kg]), pushing, and pulling until your health care provider says it is okay.  Take showers if your health care provider approves. Pat incisions dry. Do not rub incisions with a washcloth or towel. Do not take tub baths until your health care provider approves.  Wear compression stockings as directed  by your health care provider. These stockings help prevent blood clots from forming in your legs.  Talk with your health care provider about when you may return to work and your exercise routine.  Do not drive until your health care provider approves.  You may resume your normal diet. Eat a well-balanced diet.  Drink enough fluids to keep your urine clear or pale yellow.  Your normal bowel function should return. If you become constipated, you may: ? Take a mild laxative. ? Add fruit and bran to your diet. ? Drink more fluids.  Do not have sexual intercourse until permitted by your health care provider.  Follow up with your health care provider as directed. Contact a health care provider if: You have persistent nausea or vomiting. Get help  right away if:  You have increased bleeding (more than a small spot) from the vaginal area.  Your pain is not relieved with medicine or becomes worse.  You have redness, swelling, or increasing pain in the vaginal area.  You have abdominal pain.  You see pus coming from the wounds.  You develop a fever.  You have a foul smell coming from your vaginal area.  You develop light-headedness or you feel faint.  You have difficulty breathing. This information is not intended to replace advice given to you by your health care provider. Make sure you discuss any questions you have with your health care provider. Document Released: 01/06/2005 Document Revised: 11/26/2015 Document Reviewed: 11/09/2012 Elsevier Interactive Patient Education  2017 [place] Anesthesia, Adult General anesthesia is the use of medicines to make a person "go to sleep" (be unconscious) for a medical procedure. General anesthesia is often recommended when a procedure:  Is long.  Requires you to be still or in an unusual position.  Is major and can cause you to lose blood.  Is impossible to do without general anesthesia.  The medicines used for general anesthesia are called general anesthetics. In addition to making you sleep, the medicines:  Prevent pain.  Control your blood pressure.  Relax your muscles.  Tell a health care provider about:  Any allergies you have.  All medicines you are taking, including vitamins, herbs, eye drops, creams, and over-the-counter medicines.  Any problems you or family members have had with anesthetic medicines.  Types of anesthetics you have had in the past.  Any bleeding disorders you have.  Any surgeries you have had.  Any medical conditions you have.  Any history of heart or lung conditions, such as heart failure, sleep apnea, or chronic obstructive pulmonary disease (COPD).  Whether you are pregnant or may be pregnant.  Whether you use  tobacco, alcohol, marijuana, or street drugs.  Any history of Armed forces logistics/support/administrative officer.  Any history of depression or anxiety. What are the risks? Generally, this is a safe procedure. However, problems may occur, including:  Allergic reaction to anesthetics.  Lung and heart problems.  Inhaling food or liquids from your stomach into your lungs (aspiration).  Injury to nerves.  Waking up during your procedure and being unable to move (rare).  Extreme agitation or a state of mental confusion (delirium) when you wake up from the anesthetic.  Air in the bloodstream, which can lead to stroke.  These problems are more likely to develop if you are having a major surgery or if you have an advanced medical condition. You can prevent some of these complications by answering all of your health care provider's questions thoroughly and by  following all pre-procedure instructions. General anesthesia can cause side effects, including:  Nausea or vomiting  A sore throat from the breathing tube.  Feeling cold or shivery.  Feeling tired, washed out, or achy.  Sleepiness or drowsiness.  Confusion or agitation.  What happens before the procedure? Staying hydrated Follow instructions from your health care provider about hydration, which may include:  Up to 2 hours before the procedure - you may continue to drink clear liquids, such as water, clear fruit juice, black coffee, and plain tea.  Eating and drinking restrictions Follow instructions from your health care provider about eating and drinking, which may include:  8 hours before the procedure - stop eating heavy meals or foods such as meat, fried foods, or fatty foods.  6 hours before the procedure - stop eating light meals or foods, such as toast or cereal.  6 hours before the procedure - stop drinking milk or drinks that contain milk.  2 hours before the procedure - stop drinking clear liquids.  Medicines  Ask your health care  provider about: ? Changing or stopping your regular medicines. This is especially important if you are taking diabetes medicines or blood thinners. ? Taking medicines such as aspirin and ibuprofen. These medicines can thin your blood. Do not take these medicines before your procedure if your health care provider instructs you not to. ? Taking new dietary supplements or medicines. Do not take these during the week before your procedure unless your health care provider approves them.  If you are told to take a medicine or to continue taking a medicine on the day of the procedure, take the medicine with sips of water. General instructions   Ask if you will be going home the same day, the following day, or after a longer hospital stay. ? Plan to have someone take you home. ? Plan to have someone stay with you for the first 24 hours after you leave the hospital or clinic.  For 3-6 weeks before the procedure, try not to use any tobacco products, such as cigarettes, chewing tobacco, and e-cigarettes.  You may brush your teeth on the morning of the procedure, but make sure to spit out the toothpaste. What happens during the procedure?  You will be given anesthetics through a mask and through an IV tube in one of your veins.  You may receive medicine to help you relax (sedative).  As soon as you are asleep, a breathing tube may be used to help you breathe.  An anesthesia specialist will stay with you throughout the procedure. He or she will help keep you comfortable and safe by continuing to give you medicines and adjusting the amount of medicine that you get. He or she will also watch your blood pressure, pulse, and oxygen levels to make sure that the anesthetics do not cause any problems.  If a breathing tube was used to help you breathe, it will be removed before you wake up. The procedure may vary among health care providers and hospitals. What happens after the procedure?  You will wake up,  often slowly, after the procedure is complete, usually in a recovery area.  Your blood pressure, heart rate, breathing rate, and blood oxygen level will be monitored until the medicines you were given have worn off.  You may be given medicine to help you calm down if you feel anxious or agitated.  If you will be going home the same day, your health care provider may check to  make sure you can stand, drink, and urinate.  Your health care providers will treat your pain and side effects before you go home.  Do not drive for 24 hours if you received a sedative.  You may: ? Feel nauseous and vomit. ? Have a sore throat. ? Have mental slowness. ? Feel cold or shivery. ? Feel sleepy. ? Feel tired. ? Feel sore or achy, even in parts of your body where you did not have surgery. This information is not intended to replace advice given to you by your health care provider. Make sure you discuss any questions you have with your health care provider. Document Released: 09/27/2007 Document Revised: 12/01/2015 Document Reviewed: 06/04/2015 Elsevier Interactive Patient Education  2018 [place] Anesthesia, Adult, Care After These instructions provide you with information about caring for yourself after your procedure. Your health care provider may also give you more specific instructions. Your treatment has been planned according to current medical practices, but problems sometimes occur. Call your health care provider if you have any problems or questions after your procedure. What can I expect after the procedure? After the procedure, it is common to have:  Vomiting.  A sore throat.  Mental slowness.  It is common to feel:  Nauseous.  Cold or shivery.  Sleepy.  Tired.  Sore or achy, even in parts of your body where you did not have surgery.  Follow these instructions at home: For at least 24 hours after the procedure:  Do not: ? Participate in activities where you  could fall or become injured. ? Drive. ? Use heavy machinery. ? Drink alcohol. ? Take sleeping pills or medicines that cause drowsiness. ? Make important decisions or sign legal documents. ? Take care of children on your own.  Rest. Eating and drinking  If you vomit, drink water, juice, or soup when you can drink without vomiting.  Drink enough fluid to keep your urine clear or pale yellow.  Make sure you have little or no nausea before eating solid foods.  Follow the diet recommended by your health care provider. General instructions  Have a responsible adult stay with you until you are awake and alert.  Return to your normal activities as told by your health care provider. Ask your health care provider what activities are safe for you.  Take over-the-counter and prescription medicines only as told by your health care provider.  If you smoke, do not smoke without supervision.  Keep all follow-up visits as told by your health care provider. This is important. Contact a health care provider if:  You continue to have nausea or vomiting at home, and medicines are not helpful.  You cannot drink fluids or start eating again.  You cannot urinate after 8-12 hours.  You develop a skin rash.  You have fever.  You have increasing redness at the site of your procedure. Get help right away if:  You have difficulty breathing.  You have chest pain.  You have unexpected bleeding.  You feel that you are having a life-threatening or urgent problem. This information is not intended to replace advice given to you by your health care provider. Make sure you discuss any questions you have with your health care provider. Document Released: 09/26/2000 Document Revised: 11/23/2015 Document Reviewed: 06/04/2015 Elsevier Interactive Patient Education  [employer].

## 2017-03-30 ENCOUNTER — Encounter (HOSPITAL_COMMUNITY)
Admission: RE | Admit: 2017-03-30 | Discharge: 2017-03-30 | Disposition: A | Discharge status: Home / Self Care | Payer: 59 | Source: Ambulatory Visit | Attending: Obstetrics and Gynecology | Admitting: Obstetrics and Gynecology

## 2017-03-30 ENCOUNTER — Other Ambulatory Visit: Payer: Self-pay | Admitting: Obstetrics and Gynecology

## 2017-03-30 ENCOUNTER — Encounter (HOSPITAL_COMMUNITY): Payer: Self-pay

## 2017-03-30 DIAGNOSIS — Z01812 Encounter for preprocedural laboratory examination: Secondary | ICD-10-CM | POA: Diagnosis not present

## 2017-03-30 DIAGNOSIS — Z01810 Encounter for preprocedural cardiovascular examination: Secondary | ICD-10-CM | POA: Diagnosis present

## 2017-03-30 HISTORY — DX: Personal history of urinary calculi: Z87.442

## 2017-03-30 LAB — URINALYSIS, ROUTINE W REFLEX MICROSCOPIC
Bilirubin Urine: NEGATIVE
Glucose, UA: NEGATIVE mg/dL
Hgb urine dipstick: NEGATIVE
Ketones, ur: NEGATIVE mg/dL
Nitrite: NEGATIVE
Protein, ur: NEGATIVE mg/dL
Specific Gravity, Urine: 1.021 (ref 1.005–1.030)
pH: 5 (ref 5.0–8.0)

## 2017-03-30 LAB — COMPREHENSIVE METABOLIC PANEL
ALT: 19 U/L (ref 14–54)
AST: 18 U/L (ref 15–41)
Albumin: 4.3 g/dL (ref 3.5–5.0)
Alkaline Phosphatase: 51 U/L (ref 38–126)
Anion gap: 11 (ref 5–15)
BUN: 16 mg/dL (ref 6–20)
CO2: 27 mmol/L (ref 22–32)
Calcium: 9.3 mg/dL (ref 8.9–10.3)
Chloride: 100 mmol/L — ABNORMAL LOW (ref 101–111)
Creatinine, Ser: 0.9 mg/dL (ref 0.44–1.00)
GFR calc Af Amer: >60 mL/min (ref >60)
GFR calc non Af Amer: >60 mL/min (ref >60)
Glucose, Bld: 138 mg/dL — ABNORMAL HIGH (ref 65–99)
Potassium: 3.8 mmol/L (ref 3.5–5.1)
Sodium: 138 mmol/L (ref 135–145)
Total Bilirubin: 0.8 mg/dL (ref 0.3–1.2)
Total Protein: 6.7 g/dL (ref 6.5–8.1)

## 2017-03-30 LAB — CBC
HCT: 39 % (ref 36.0–46.0)
Hemoglobin: 13.2 g/dL (ref 12.0–15.0)
MCH: 31.9 pg (ref 26.0–34.0)
MCHC: 33.8 g/dL (ref 30.0–36.0)
MCV: 94.2 fL (ref 78.0–100.0)
Platelets: 263 10*3/uL (ref 150–400)
RBC: 4.14 MIL/uL (ref 3.87–5.11)
RDW: 12 % (ref 11.5–15.5)
WBC: 5.1 10*3/uL (ref 4.0–10.5)

## 2017-04-04 ENCOUNTER — Ambulatory Visit (HOSPITAL_COMMUNITY)
Admission: RE | Admit: 2017-04-04 | Discharge: 2017-04-04 | Disposition: A | Discharge status: Home / Self Care | Payer: 59 | Source: Ambulatory Visit | Attending: Obstetrics and Gynecology | Admitting: Obstetrics and Gynecology

## 2017-04-04 ENCOUNTER — Ambulatory Visit (HOSPITAL_COMMUNITY): Payer: 59 | Admitting: Anesthesiology

## 2017-04-04 ENCOUNTER — Encounter (HOSPITAL_COMMUNITY): Payer: Self-pay

## 2017-04-04 ENCOUNTER — Encounter (HOSPITAL_COMMUNITY)
Admission: RE | Disposition: A | Discharge status: Home / Self Care | Payer: Self-pay | Source: Ambulatory Visit | Attending: Obstetrics and Gynecology

## 2017-04-04 DIAGNOSIS — E785 Hyperlipidemia, unspecified: Secondary | ICD-10-CM | POA: Insufficient documentation

## 2017-04-04 DIAGNOSIS — G4730 Sleep apnea, unspecified: Secondary | ICD-10-CM | POA: Diagnosis not present

## 2017-04-04 DIAGNOSIS — Z87891 Personal history of nicotine dependence: Secondary | ICD-10-CM | POA: Insufficient documentation

## 2017-04-04 DIAGNOSIS — M25512 Pain in left shoulder: Secondary | ICD-10-CM | POA: Diagnosis not present

## 2017-04-04 DIAGNOSIS — I69354 Hemiplegia and hemiparesis following cerebral infarction affecting left non-dominant side: Secondary | ICD-10-CM | POA: Diagnosis not present

## 2017-04-04 DIAGNOSIS — Z79899 Other long term (current) drug therapy: Secondary | ICD-10-CM | POA: Insufficient documentation

## 2017-04-04 DIAGNOSIS — B3789 Other sites of candidiasis: Secondary | ICD-10-CM | POA: Insufficient documentation

## 2017-04-04 DIAGNOSIS — M25511 Pain in right shoulder: Secondary | ICD-10-CM | POA: Insufficient documentation

## 2017-04-04 DIAGNOSIS — Z885 Allergy status to narcotic agent status: Secondary | ICD-10-CM | POA: Diagnosis not present

## 2017-04-04 DIAGNOSIS — F319 Bipolar disorder, unspecified: Secondary | ICD-10-CM | POA: Insufficient documentation

## 2017-04-04 DIAGNOSIS — F419 Anxiety disorder, unspecified: Secondary | ICD-10-CM | POA: Diagnosis not present

## 2017-04-04 DIAGNOSIS — I10 Essential (primary) hypertension: Secondary | ICD-10-CM | POA: Insufficient documentation

## 2017-04-04 DIAGNOSIS — N941 Unspecified dyspareunia: Secondary | ICD-10-CM | POA: Insufficient documentation

## 2017-04-04 DIAGNOSIS — M549 Dorsalgia, unspecified: Secondary | ICD-10-CM | POA: Insufficient documentation

## 2017-04-04 DIAGNOSIS — N895 Stricture and atresia of vagina: Secondary | ICD-10-CM | POA: Diagnosis not present

## 2017-04-04 DIAGNOSIS — K589 Irritable bowel syndrome without diarrhea: Secondary | ICD-10-CM | POA: Insufficient documentation

## 2017-04-04 DIAGNOSIS — Z888 Allergy status to other drugs, medicaments and biological substances status: Secondary | ICD-10-CM | POA: Diagnosis not present

## 2017-04-04 DIAGNOSIS — N816 Rectocele: Secondary | ICD-10-CM | POA: Insufficient documentation

## 2017-04-04 DIAGNOSIS — M1990 Unspecified osteoarthritis, unspecified site: Secondary | ICD-10-CM | POA: Diagnosis not present

## 2017-04-04 DIAGNOSIS — Z90710 Acquired absence of both cervix and uterus: Secondary | ICD-10-CM | POA: Insufficient documentation

## 2017-04-04 DIAGNOSIS — K219 Gastro-esophageal reflux disease without esophagitis: Secondary | ICD-10-CM | POA: Diagnosis not present

## 2017-04-04 HISTORY — PX: RECTOCELE REPAIR: SHX761

## 2017-04-04 SURGERY — COLPORRHAPHY, POSTERIOR, FOR RECTOCELE REPAIR
Anesthesia: General | Site: Vagina

## 2017-04-04 MED ORDER — MIDAZOLAM HCL 2 MG/2ML IJ SOLN
INTRAMUSCULAR | Status: AC
  Filled 2017-04-04: qty 2

## 2017-04-04 MED ORDER — CEFAZOLIN SODIUM-DEXTROSE 2-4 GM/100ML-% IV SOLN
2.0000 g | INTRAVENOUS | Status: AC
  Administered 2017-04-04: 2 g via INTRAVENOUS
  Filled 2017-04-04: qty 100

## 2017-04-04 MED ORDER — LACTATED RINGERS IV SOLN
INTRAVENOUS | Status: DC
  Administered 2017-04-04: 10:00:00 via INTRAVENOUS

## 2017-04-04 MED ORDER — MIDAZOLAM HCL 2 MG/2ML IJ SOLN
1.0000 mg | INTRAMUSCULAR | Status: AC
  Administered 2017-04-04: 2 mg via INTRAVENOUS

## 2017-04-04 MED ORDER — LIDOCAINE HCL (CARDIAC) 20 MG/ML IV SOLN
INTRAVENOUS | Status: DC | PRN
  Administered 2017-04-04: 30 mg via INTRAVENOUS

## 2017-04-04 MED ORDER — HYDROCODONE-ACETAMINOPHEN 5-325 MG PO TABS: ORAL_TABLET | ORAL | 0 refills | Status: DC

## 2017-04-04 MED ORDER — GLYCOPYRROLATE 0.2 MG/ML IJ SOLN
0.2000 mg | Freq: Once | INTRAMUSCULAR | Status: AC | PRN
  Administered 2017-04-04: 0.2 mg via INTRAVENOUS

## 2017-04-04 MED ORDER — PROPOFOL 10 MG/ML IV BOLUS
INTRAVENOUS | Status: AC
  Filled 2017-04-04: qty 40

## 2017-04-04 MED ORDER — ONDANSETRON HCL 4 MG/2ML IJ SOLN
4.0000 mg | Freq: Once | INTRAMUSCULAR | Status: AC
  Administered 2017-04-04: 4 mg via INTRAVENOUS

## 2017-04-04 MED ORDER — PHENYLEPHRINE 40 MCG/ML (10ML) SYRINGE FOR IV PUSH (FOR BLOOD PRESSURE SUPPORT)
PREFILLED_SYRINGE | INTRAVENOUS | Status: AC
  Filled 2017-04-04: qty 10

## 2017-04-04 MED ORDER — SODIUM CHLORIDE 0.9 % IR SOLN
Status: DC | PRN
  Administered 2017-04-04: 1000 mL

## 2017-04-04 MED ORDER — ONDANSETRON HCL 4 MG/2ML IJ SOLN
INTRAMUSCULAR | Status: AC
  Filled 2017-04-04: qty 2

## 2017-04-04 MED ORDER — GLYCOPYRROLATE 0.2 MG/ML IJ SOLN
INTRAMUSCULAR | Status: AC
  Filled 2017-04-04: qty 1

## 2017-04-04 MED ORDER — FENTANYL CITRATE (PF) 100 MCG/2ML IJ SOLN: 25.0000 ug | INTRAMUSCULAR | Status: DC | PRN

## 2017-04-04 MED ORDER — BUPIVACAINE-EPINEPHRINE (PF) 0.5% -1:200000 IJ SOLN
INTRAMUSCULAR | Status: AC
  Filled 2017-04-04: qty 30

## 2017-04-04 MED ORDER — BUPIVACAINE-EPINEPHRINE 0.5% -1:200000 IJ SOLN
INTRAMUSCULAR | Status: DC | PRN
  Administered 2017-04-04: 15 mL

## 2017-04-04 MED ORDER — FENTANYL CITRATE (PF) 250 MCG/5ML IJ SOLN
INTRAMUSCULAR | Status: AC
  Filled 2017-04-04: qty 5

## 2017-04-04 MED ORDER — LIDOCAINE HCL (PF) 1 % IJ SOLN
INTRAMUSCULAR | Status: AC
  Filled 2017-04-04: qty 5

## 2017-04-04 MED ORDER — FENTANYL CITRATE (PF) 100 MCG/2ML IJ SOLN
INTRAMUSCULAR | Status: DC | PRN
  Administered 2017-04-04 (×2): 50 ug via INTRAVENOUS

## 2017-04-04 MED ORDER — PROPOFOL 10 MG/ML IV BOLUS
INTRAVENOUS | Status: DC | PRN
  Administered 2017-04-04: 140 mg via INTRAVENOUS

## 2017-04-04 SURGICAL SUPPLY — 32 items
BAG HAMPER (MISCELLANEOUS) ×3 IMPLANT
CLOTH BEACON ORANGE TIMEOUT ST (SAFETY) ×3 IMPLANT
COVER LIGHT HANDLE STERIS (MISCELLANEOUS) ×6 IMPLANT
DECANTER SPIKE VIAL GLASS SM (MISCELLANEOUS) ×3 IMPLANT
DRAPE HALF SHEET 40X57 (DRAPES) ×3 IMPLANT
DRAPE PROXIMA HALF (DRAPES) ×3 IMPLANT
DRAPE STERI URO 9X17 APER PCH (DRAPES) ×3 IMPLANT
ELECT REM PT RETURN 9FT ADLT (ELECTROSURGICAL) ×3
ELECTRODE REM PT RTRN 9FT ADLT (ELECTROSURGICAL) ×1 IMPLANT
GAUZE PACKING 2X5 YD STRL (GAUZE/BANDAGES/DRESSINGS) ×3 IMPLANT
GLOVE BIO SURGEON STRL SZ7 (GLOVE) ×4 IMPLANT
GLOVE BIOGEL PI IND STRL 6.5 (GLOVE) IMPLANT
GLOVE BIOGEL PI IND STRL 7.0 (GLOVE) ×1 IMPLANT
GLOVE BIOGEL PI IND STRL 9 (GLOVE) ×1 IMPLANT
GLOVE BIOGEL PI INDICATOR 6.5 (GLOVE) ×2
GLOVE BIOGEL PI INDICATOR 7.0 (GLOVE) ×4
GLOVE BIOGEL PI INDICATOR 9 (GLOVE) ×2
GLOVE ECLIPSE 9.0 STRL (GLOVE) ×6 IMPLANT
GOWN SPEC L3 XXLG W/TWL (GOWN DISPOSABLE) ×3 IMPLANT
GOWN STRL REUS W/TWL LRG LVL3 (GOWN DISPOSABLE) ×3 IMPLANT
KIT ROOM TURNOVER AP CYSTO (KITS) ×3 IMPLANT
MANIFOLD NEPTUNE II (INSTRUMENTS) ×3 IMPLANT
NDL HYPO 25X1 1.5 SAFETY (NEEDLE) ×1 IMPLANT
NEEDLE HYPO 25X1 1.5 SAFETY (NEEDLE) ×3 IMPLANT
NS IRRIG 1000ML POUR BTL (IV SOLUTION) ×3 IMPLANT
PACK PERI GYN (CUSTOM PROCEDURE TRAY) ×3 IMPLANT
PAD ARMBOARD 7.5X6 YLW CONV (MISCELLANEOUS) ×3 IMPLANT
SET BASIN LINEN APH (SET/KITS/TRAYS/PACK) ×3 IMPLANT
SUT CHROMIC 2 0 CT 1 (SUTURE) ×3 IMPLANT
SUT VIC AB 0 CT2 8-18 (SUTURE) ×3 IMPLANT
SYR BULB IRRIGATION 50ML (SYRINGE) ×2 IMPLANT
SYR CONTROL 10ML LL (SYRINGE) ×3 IMPLANT

## 2017-04-04 NOTE — Interval H&P Note (Signed)
History and Physical Interval Note:  04/04/2017 11:49 AM  [name]  has presented today for surgery, with the diagnosis of dyspareunia, vaginal stricture. Stricture of Vagina  The various methods of treatment have been discussed with the patient and family. After consideration of risks, benefits and other options for treatment, the patient has consented to  Procedure(s): POSTERIOR REPAIR (RECTOCELE) (N/A) as a surgical intervention .  The patient's history has been reviewed, patient examined, no change in status, stable for surgery.  I have reviewed the patient's chart and labs.  Questions were answered to the patient's satisfaction.    CBC    Component Value Date/Time   WBC 5.1 03/30/2017 0925   RBC 4.14 03/30/2017 0925   HGB 13.2 03/30/2017 0925   HCT 39.0 03/30/2017 0925   PLT 263 03/30/2017 0925   MCV 94.2 03/30/2017 0925   MCH 31.9 03/30/2017 0925   MCHC 33.8 03/30/2017 0925   RDW 12.0 03/30/2017 0925   LYMPHSABS 1.4 10/05/2015 1017   MONOABS 0.4 10/05/2015 1017   EOSABS 0.1 10/05/2015 1017   BASOSABS 0.0 10/05/2015 1017   BMET    Component Value Date/Time   NA 138 03/30/2017 0925   K 3.8 03/30/2017 0925   CL 100 (L) 03/30/2017 0925   CO2 27 03/30/2017 0925   GLUCOSE 138 (H) 03/30/2017 0925   BUN 16 03/30/2017 0925   CREATININE 0.90 03/30/2017 0925   CREATININE 0.99 09/16/2015 0939   CALCIUM 9.3 03/30/2017 0925   GFRNONAA >60 03/30/2017 0925   GFRAA >60 03/30/2017 [name]

## 2017-04-04 NOTE — Anesthesia Postprocedure Evaluation (Signed)
Anesthesia Post Note  Patient: [name]  Procedure(s) Performed: POSTERIOR REPAIR (RECTOCELE) (N/A Vagina )  Patient location during evaluation: PACU Anesthesia Type: General Level of consciousness: awake and alert and patient cooperative Pain management: pain level controlled Vital Signs Assessment: post-procedure vital signs reviewed and stable Respiratory status: spontaneous breathing, nonlabored ventilation and respiratory function stable Cardiovascular status: blood pressure returned to baseline Postop Assessment: no apparent nausea or vomiting Anesthetic complications: no     Last Vitals:  Vitals:   04/04/17 1315 04/04/17 1330  BP: 107/63 124/63  Pulse: 74 74  Resp: (!) 23 13  Temp:    SpO2: 100% 92%    Last Pain:  Vitals:   04/04/17 1300  TempSrc:   PainSc: 8                  Mallorey Odonell J

## 2017-04-04 NOTE — Anesthesia Procedure Notes (Signed)
Procedure Name: LMA Insertion Date/Time: 04/04/2017 12:04 PM Performed by: Andree Elk, AMY A Pre-anesthesia Checklist: Patient identified, Timeout performed, Emergency Drugs available, Suction available and Patient being monitored Patient Re-evaluated:Patient Re-evaluated prior to induction Oxygen Delivery Method: Circle system utilized Preoxygenation: Pre-oxygenation with 100% oxygen Induction Type: IV induction Ventilation: Mask ventilation without difficulty LMA: LMA inserted LMA Size: 3.0 Number of attempts: 1 Placement Confirmation: positive ETCO2 and breath sounds checked- equal and bilateral Tube secured with: Tape Dental Injury: Teeth and Oropharynx as per pre-operative assessment

## 2017-04-04 NOTE — Op Note (Signed)
Please see the brief operative note for surgical details.

## 2017-04-04 NOTE — Discharge Instructions (Signed)
Anterior and Posterior Colporrhaphy, Care After Refer to this sheet in the next few weeks. These instructions provide you with information on caring for yourself after your procedure. Your health care provider may also give you more specific instructions. Your treatment has been planned according to current medical practices, but problems sometimes occur. Call your health care provider if you have any problems or questions after your procedure. Follow these instructions at home:  Take frequent rest periods throughout the day.  Only take over-the-counter or prescription medicines as directed by your health care provider.  Avoid strenuous activity such as heavy lifting (more than 10 pounds [4.5 kg]), pushing, and pulling until your health care provider says it is okay.  Take showers if your health care provider approves. Pat incisions dry. Do not rub incisions with a washcloth or towel. Do not take tub baths until your health care provider approves.  Wear compression stockings as directed by your health care provider. These stockings help prevent blood clots from forming in your legs.  Talk with your health care provider about when you may return to work and your exercise routine.  Do not drive until your health care provider approves.  You may resume your normal diet. Eat a well-balanced diet.  Drink enough fluids to keep your urine clear or pale yellow.  Your normal bowel function should return. If you become constipated, you may: ? Take a mild laxative. ? Add fruit and bran to your diet. ? Drink more fluids.  Do not have sexual intercourse until permitted by your health care provider.  Follow up with your health care provider as directed. Contact a health care provider if: You have persistent nausea or vomiting. Get help right away if:  You have increased bleeding (more than a small spot) from the vaginal area.  Your pain is not relieved with medicine or becomes worse.  You  have redness, swelling, or increasing pain in the vaginal area.  You have abdominal pain.  You see pus coming from the wounds.  You develop a fever.  You have a foul smell coming from your vaginal area.  You develop light-headedness or you feel faint.  You have difficulty breathing. This information is not intended to replace advice given to you by your health care provider. Make sure you discuss any questions you have with your health care provider. Document Released: 01/06/2005 Document Revised: 11/26/2015 Document Reviewed: 11/09/2012 Elsevier Interactive Patient Education  2017 [employer]  PATIENT INSTRUCTIONS POST-ANESTHESIA  IMMEDIATELY FOLLOWING SURGERY:  Do not drive or operate machinery for the first twenty four hours after surgery.  Do not make any important decisions for twenty four hours after surgery or while taking narcotic pain medications or sedatives.  If you develop intractable nausea and vomiting or a severe headache please notify your doctor immediately.  FOLLOW-UP:  Please make an appointment with your surgeon as instructed. You do not need to follow up with anesthesia unless specifically instructed to do so.  WOUND CARE INSTRUCTIONS (if applicable):  Keep a dry clean dressing on the anesthesia/puncture wound site if there is drainage.  Once the wound has quit draining you may leave it open to air.  Generally you should leave the bandage intact for twenty four hours unless there is drainage.  If the epidural site drains for more than 36-48 hours please call the anesthesia department.  QUESTIONS?:  Please feel free to call your physician or the hospital operator if you have any questions, and they will be  happy to assist you.

## 2017-04-04 NOTE — Anesthesia Preprocedure Evaluation (Signed)
Anesthesia Evaluation  Patient identified by MRN, date of birth, ID band Patient awake    Reviewed: Allergy & Precautions, H&P , NPO status , Patient's Chart, lab work & pertinent test results  Airway Mallampati: III  TM Distance: <3 FB Neck ROM: Full  Mouth opening: Limited Mouth Opening  Dental  (+) Teeth Intact   Pulmonary sleep apnea , former smoker,    breath sounds clear to auscultation       Cardiovascular hypertension, Pt. on medications  Rhythm:Regular Rate:Normal     Neuro/Psych PSYCHIATRIC DISORDERS Anxiety Depression Bipolar Disorder  Neuromuscular disease CVA (Left side weakness), Residual Symptoms    GI/Hepatic GERD  Medicated and Controlled,  Endo/Other    Renal/GU      Musculoskeletal   Abdominal   Peds  Hematology   Anesthesia Other Findings   Reproductive/Obstetrics                             Anesthesia Physical Anesthesia Plan  ASA: III  Anesthesia Plan: General   Post-op Pain Management:    Induction: Intravenous  PONV Risk Score and Plan:   Airway Management Planned: LMA  Additional Equipment:   Intra-op Plan:   Post-operative Plan: Extubation in OR  Informed Consent: I have reviewed the patients History and Physical, chart, labs and discussed the procedure including the risks, benefits and alternatives for the proposed anesthesia with the patient or authorized representative who has indicated his/her understanding and acceptance.     Plan Discussed with:   Anesthesia Plan Comments:         Anesthesia Quick Evaluation

## 2017-04-04 NOTE — Brief Op Note (Signed)
04/04/2017  1:58 PM  PATIENT:  [name]  56 y.o. female  PRE-OPERATIVE DIAGNOSIS:  dyspareunia Stricture of Vagina  POST-OPERATIVE DIAGNOSIS:  dyspareunia Stricture of Vagina  PROCEDURE:  Procedure(s): POSTERIOR REPAIR (RECTOCELE) (N/A)revision of posterior repair  SURGEON:  Surgeon(s) and Role:    Jonnie Kind, MD - Primary  PHYSICIAN ASSISTANT:   ASSISTANTS: Kendrick CST.   ANESTHESIA:   local and general  EBL:  Total I/O In: 770 [P.O.:220; I.V.:550] Out: 5 [Blood:5]  BLOOD ADMINISTERED:none  DRAINS: none   LOCAL MEDICATIONS USED:  MARCAINE    and Amount: 20 ml  SPECIMEN:  No Specimen  DISPOSITION OF SPECIMEN:  N/A  COUNTS:  YES  TOURNIQUET:  * No tourniquets in log *  DICTATION: .Dragon Dictation  PLAN OF CARE: Discharge to home after PACU  PATIENT DISPOSITION:  PACU - hemodynamically stable.   Delay start of Pharmacological VTE agent (>24hrs) due to surgical blood loss or risk of bleeding: not applicable Details of procedure: Patient was taken operating room prepped and draped for vaginal procedure timeout was conducted, Ancef administered. Operative team confirmed the patient's name procedure and planned surgery. The vagina was examined and digital rectal exam performed. Patient had adequate posterior support on rectal exam, but had the stricture at the midportion of the posterior repair, involving tightening to the point that penetration was impossible so the vaginal epithelium was opened over the old posterior repair in the tight band of muscular tissue at the restrictive band was identified and transected in the midline allowing it to fall laterally. Remained adequate rectal support that rectocele was not felt to be likely to read occur in the vaginal epithelium was loosened above the site of the stricture release and pulled externally and attached to the anterior aspects of the incision opening, resulting in significant releasing of the stricture  with approximately 2 cm additional transverse vaginal diameter patient tolerated procedure well and went to recovery room in stable condition

## 2017-04-04 NOTE — H&P (Signed)
Patient ID: [name], female   DOB: [birth year], 56 y.o.   MRN: [id number] Preoperative History and Physical  [name] is a 56 y.o. No obstetric history on file. here for surgical management of repeat posterior rectocele. No significant preoperative concerns. Pt reports associated dyspareunia, vaginal bleeding with sexual intercourse and loose watery stools. Pt notes that she has tried premarin cream. Denies any other symptoms.   Proposed surgery: Release of vaginal stricture by revision of posterior repair.      Past Medical History:  Diagnosis Date  . Anxiety   . Arthritis   . Bipolar disorder ([place])   . Carpal tunnel syndrome    Bilateral  . Chest pain 09/2011   Cardiac cath-normal coronaries  . Constipation   . Depression   . GERD (gastroesophageal reflux disease)   . Hyperlipemia   . Hyperlipidemia   . Hypertension    Mild; provoked by stress and anxiety  . IBS (irritable bowel syndrome)   . Intracerebral bleed ([place])    No aneurysm; followed by Dr. Sherwood Gambler  . Sleep apnea    Stop Bang score of 4-No Sleep study done.  . Stroke [facility]) 1999   hemorrhagic stroke; weakness of left side        Past Surgical History:  Procedure Laterality Date  . ABDOMINAL HYSTERECTOMY    . [place]   to remove blood clot after stroke   . CARDIAC CATHETERIZATION    . CERVICAL FUSION    . CHOLECYSTECTOMY N/A 10/14/2014   Procedure: LAPAROSCOPIC CHOLECYSTECTOMY WITH INTRAOPERATIVE CHOLANGIOGRAM;  Surgeon: Jackolyn Confer, MD;  Location: [place];  Service: General;  Laterality: N/A;  . EUS N/A 08/21/2015   Procedure: ESOPHAGEAL ENDOSCOPIC ULTRASOUND (EUS) RADIAL;  Surgeon: Carol Ada, MD;  [facility];  Service: Endoscopy;  Laterality: N/A;  . LEFT HEART CATHETERIZATION WITH CORONARY ANGIOGRAM N/A 09/23/2011   Procedure: LEFT HEART CATHETERIZATION WITH CORONARY ANGIOGRAM;  Surgeon: Thayer Headings, MD;  Location: [facility] CATH LAB;   Service: Cardiovascular;  Laterality: N/A;  . LIPOMA EXCISION Left 11/18/2013   Procedure: EXCISION OF SOFT TISSUE MASS-LEFT THIGH;  Surgeon: Jamesetta So, MD;  [facility];  Service: General;  Laterality: Left;  . RECTOCELE REPAIR     x2   OB History  No data available  Patient denies any other pertinent gynecologic issues.         Current Outpatient Prescriptions on File Prior to Visit  Medication Sig Dispense Refill  . acetaminophen (TYLENOL) 500 MG tablet Take 500 mg by mouth every 6 (six) hours as needed for moderate pain.     Marland Kitchen ALPRAZolam (XANAX) 1 MG tablet Take 1 tablet (1 mg total) by mouth 3 (three) times daily. 90 tablet 2  . ARIPiprazole (ABILIFY) 2 MG tablet Take 1 tablet (2 mg total) by mouth daily. 90 tablet 2  . Ascorbic Acid (VITAMIN C) 1000 MG tablet Take 1,000 mg by mouth daily.    . B Complex-C (SUPER B COMPLEX PO) Take 1 tablet by mouth daily.     . cetirizine (ZYRTEC) 10 MG tablet Take 10 mg by mouth daily.    . COD LIVER OIL PO Take by mouth at bedtime.    . Evening Primrose Oil CAPS Take 1 capsule by mouth daily.    Marland Kitchen FLUoxetine (PROZAC) 20 MG capsule Take 1 capsule (20 mg total) by mouth daily. 90 capsule 2  . HYDROcodone-acetaminophen (NORCO/VICODIN) 5-325 MG tablet Take one-two tabs po q  4-6 hrs prn pain 20 tablet 0  . lisinopril (PRINIVIL,ZESTRIL) 20 MG tablet     . methylphenidate (RITALIN) 10 MG tablet Take 1 tablet (10 mg total) by mouth 2 (two) times daily with breakfast and lunch. (Patient taking differently: Take 10 mg by mouth daily. ) 60 tablet 0  . Multiple Vitamin (MULITIVITAMIN WITH MINERALS) TABS Take 1 tablet by mouth daily.    Marland Kitchen nystatin (MYCOSTATIN) powder APPLY FOUR TIMES DAILY AS NEEDED. (Patient taking differently: APPLY FOUR TIMES DAILY AS NEEDED yeast rash) 60 g 0  . polyethylene glycol powder (GLYCOLAX/MIRALAX) powder Take 17 g by mouth daily. (Patient taking differently: Take 17 g by mouth daily as needed for  mild constipation. ) 3350 g 6  . Potassium 99 MG TABS Take 1 tablet by mouth daily.    . simvastatin (ZOCOR) 20 MG tablet TAKE ONE TABLET BY MOUTH ONCE DAILY AT BEDTIME 30 tablet 6  . traZODone (DESYREL) 100 MG tablet Take 1 tablet (100 mg total) by mouth at bedtime. 90 tablet 2  . conjugated estrogens (PREMARIN) vaginal cream Place 0.10 Applicatorfuls vaginally 2 (two) times a week. (Patient not taking: Reported on 02/22/2017) 42.5 g 12  . methocarbamol (ROBAXIN) 500 MG tablet Take 1 tablet (500 mg total) by mouth 3 (three) times daily. (Patient not taking: Reported on 02/22/2017) 21 tablet 0  . methylphenidate (RITALIN) 10 MG tablet Take 1 tablet (10 mg total) by mouth 2 (two) times daily with breakfast and lunch. (Patient not taking: Reported on 02/22/2017) 60 tablet 0   No current facility-administered medications on file prior to visit.         Allergies  Allergen Reactions  . Morphine And Related Hives  . Promethazine Hcl     Causes patient to become Hyper    Social History:   reports that she quit smoking about 19 years ago. Her smoking use included Cigarettes. She has a 19.00 pack-year smoking history. She has never used smokeless tobacco. She reports that she drinks alcohol. She reports that she does not use drugs.       Family History  Problem Relation Age of Onset  . Cancer Mother        breast   . Hypertension Mother   . Hyperlipidemia Mother   . Depression Mother   . Anxiety disorder Mother   . Drug abuse Sister   . Coronary artery disease Paternal Grandfather   . Coronary artery disease Paternal Uncle   . Depression Cousin   . Drug abuse Cousin     Review of Systems: Noncontributory  PHYSICAL EXAM: examination is confirmed with the patient in the preop area before this documentation. Blood pressure 112/60, pulse 70, height 5' 3.75" (1.619 m), weight 152 lb (68.9 kg). General appearance - alert, well appearing, and in no distress Chest -  clear to auscultation, no wheezes, rales or rhonchi, symmetric air entry Heart - normal rate and regular rhythm Abdomen - soft, nontender, nondistended, no masses or organomegaly Pelvic - normal external genitalia, vulva, vagina, cervix, uterus and adnexa,  VULVA: normal appearing vulva with no masses, tenderness or lesions,  VAGINA: Posterior wall stricture at the upper end of the prior posterior repair that interferes with intercourse. Below there there is still a small area of weakness in the posterior repair. normal appearing vagina with normal color and discharge, no lesions,  ADNEXA: normal adnexa in size, nontender and no masses,  Musculoskeletal: Current progressive curvature of spine due to breast weight.  Extremities -  peripheral pulses normal, no pedal edema, no clubbing or cyanosis   Labs: CBC    Component Value Date/Time   WBC 5.1 03/30/2017 0925   RBC 4.14 03/30/2017 0925   HGB 13.2 03/30/2017 0925   HCT 39.0 03/30/2017 0925   PLT 263 03/30/2017 0925   MCV 94.2 03/30/2017 0925   MCH 31.9 03/30/2017 0925   MCHC 33.8 03/30/2017 0925   RDW 12.0 03/30/2017 0925   LYMPHSABS 1.4 10/05/2015 1017   MONOABS 0.4 10/05/2015 1017   EOSABS 0.1 10/05/2015 1017   BASOSABS 0.0 10/05/2015 1017     Imaging Studies: ImagingResults  No results found.    Assessment: 1. Vaginal stricture s/p repair 2. Recurrent rectocele 3. Shoulder and back pain secondary to breast discomfort and breast size.  4. Chronic yeast infection under breasts  Plan: Patient will undergo surgical management with release of vaginal stricture by revising posterior repair.        Jonnie Kind, MD   By signing my name below, I, Soijett Blue, attest that this documentation has been prepared under the direction and in the presence of Jonnie Kind, MD. Electronically Signed: Soijett Blue, ED Scribe. 02/22/17. 12:29 PM.  I personally performed the services described in this  documentation, which was SCRIBED in my presence. The recorded information has been reviewed and considered accurate. It has been edited as necessary during review. Jonnie Kind, MD

## 2017-04-04 NOTE — Transfer of Care (Signed)
Immediate Anesthesia Transfer of Care Note  Patient: [name]  Procedure(s) Performed: POSTERIOR REPAIR (RECTOCELE) (N/A Vagina )  Patient Location: PACU  Anesthesia Type:MAC  Level of Consciousness: oriented and drowsy  Airway & Oxygen Therapy: Patient Spontanous Breathing and Patient connected to face mask oxygen  Post-op Assessment: Report given to RN and Post -op Vital signs reviewed and stable  Post vital signs: Reviewed and stable  Last Vitals:  Vitals:   04/04/17 1105 04/04/17 1110  BP: 117/69 113/71  Pulse:    Resp:    Temp:    SpO2: 98% 98%    Last Pain:  Vitals:   04/04/17 1004  TempSrc: Oral  PainSc: 6          Complications: No apparent anesthesia complications

## 2017-04-05 ENCOUNTER — Encounter (HOSPITAL_COMMUNITY): Payer: Self-pay | Admitting: Obstetrics and Gynecology

## 2017-04-12 ENCOUNTER — Ambulatory Visit (INDEPENDENT_AMBULATORY_CARE_PROVIDER_SITE_OTHER): Payer: 59 | Admitting: Obstetrics and Gynecology

## 2017-04-12 ENCOUNTER — Encounter: Payer: Self-pay | Admitting: Obstetrics and Gynecology

## 2017-04-12 VITALS — BP 128/70 | HR 88 | Ht 63.0 in | Wt 147.0 lb

## 2017-04-12 DIAGNOSIS — Z09 Encounter for follow-up examination after completed treatment for conditions other than malignant neoplasm: Secondary | ICD-10-CM

## 2017-04-12 DIAGNOSIS — Z9889 Other specified postprocedural states: Secondary | ICD-10-CM

## 2017-04-12 NOTE — Progress Notes (Signed)
   Subjective:  [name] is a 56 y.o. female now 1 weeks status post :: Revision of POSTERIOR REPAIR (RECTOCELE).     Review of Systems Negative except some discomfort due to stitches, some discharge and spotting bowel movements are   Diet:   Regular   Bowel movements : normal.  The patient is not having any pain.  Objective:  BP 128/70 (BP Location: Right Arm, Patient Position: Sitting, Cuff Size: Normal)   Pulse 88   Ht 5' 3"  (1.6 m)   Wt 147 lb (66.7 kg)   BMI 26.04 kg/m  General:Well developed, well nourished.  No acute distress. Abdomen: Bowel sounds normal, soft, non-tender. Pelvic Exam:    External Genitalia:  Normal.    Vagina: NormalStitches have dissolved, 2-0 chromic was used tissue edges around well reapproximated. Posterior support good    Cervix: Normal    Uterus: Not done    Adnexa/Bimanual: Not done  Incision(s):   Healing well, no drainage, no erythema, no hernia, no swelling, no dehiscence,     Assessment:  Post-Op 1 weeks s/p POSTERIOR REPAIR (RECTOCELE)    Doing well postoperatively.   Plan:  1.Wound care discussed   2. current medications. none 3. Activity restrictions: Wait 4 weeks for physical activity 4. return to work: not applicable. 5. Follow up in 4 weeks unless pt chooses to cancel appointment    By signing my name below, I, Izna Ahmed, attest that this documentation has been prepared under the direction and in the presence of Jonnie Kind, MD. Electronically Signed: Jabier Gauss, Medical Scribe. 04/12/17. 10:30 AM.  I personally performed the services described in this documentation, which was SCRIBED in my presence. The recorded information has been reviewed and considered accurate. It has been edited as necessary during review. Jonnie Kind, MD

## 2017-04-25 ENCOUNTER — Ambulatory Visit (HOSPITAL_COMMUNITY): Payer: Self-pay | Admitting: Licensed Clinical Social Worker

## 2017-05-04 ENCOUNTER — Encounter: Payer: Self-pay | Admitting: Orthopaedic Surgery

## 2017-05-04 ENCOUNTER — Ambulatory Visit (INDEPENDENT_AMBULATORY_CARE_PROVIDER_SITE_OTHER): Payer: 59

## 2017-05-04 ENCOUNTER — Ambulatory Visit (INDEPENDENT_AMBULATORY_CARE_PROVIDER_SITE_OTHER): Payer: 59 | Admitting: Orthopaedic Surgery

## 2017-05-04 VITALS — BP 110/76 | HR 71 | Temp 97.2°F | Ht 63.0 in | Wt 147.0 lb

## 2017-05-04 DIAGNOSIS — M25561 Pain in right knee: Secondary | ICD-10-CM

## 2017-05-04 DIAGNOSIS — G8929 Other chronic pain: Secondary | ICD-10-CM | POA: Diagnosis not present

## 2017-05-04 DIAGNOSIS — M25562 Pain in left knee: Secondary | ICD-10-CM

## 2017-05-04 NOTE — Progress Notes (Signed)
Subjective:    Patient ID: [name], female    DOB: 04-07-1961, 56 y.o.   MRN: [id number]  HPI She has a long history of bilateral knee pain, more on the right than the left knee.  She has swelling, popping and locking of the right knee.  It is not getting any better.  She has tried heat, ice, rubs and medicine.  She was followed by Dr. Charlestine Night, rheumatologist until he retired.  She is also followed by the [facility].  I have reviewed notes from both places prior to her visit today.  She has no recent trauma, no redness, no numbness.  She has other medical problems including osteoporosis of the left hip, fibromyalgia and back and neck pains.  She has had a CVA with left sided effects but no significant residuals.   Review of Systems  HENT: Negative for congestion.   Respiratory: Positive for shortness of breath. Negative for cough.   Cardiovascular: Negative for chest pain and leg swelling.  Endocrine: Positive for cold intolerance.  Musculoskeletal: Positive for arthralgias, back pain, gait problem and joint swelling.  Allergic/Immunologic: Positive for environmental allergies.  Psychiatric/Behavioral: The patient is nervous/anxious.    Past Medical History:  Diagnosis Date  . Anxiety   . Arthritis   . Bipolar disorder ([place])   . Carpal tunnel syndrome    Bilateral  . Chest pain 09/2011   Cardiac cath-normal coronaries  . Constipation   . Depression   . GERD (gastroesophageal reflux disease)   . History of kidney stones   . Hyperlipemia   . Hyperlipidemia   . Hypertension    Mild; provoked by stress and anxiety  . IBS (irritable bowel syndrome)   . Intracerebral bleed ([place])    No aneurysm; followed by Dr. Sherwood Gambler  . Sleep apnea    Stop Bang score of 4-No Sleep study done.  . Stroke [facility]) 1999   hemorrhagic stroke; weakness of left side    Past Surgical History:  Procedure Laterality Date  . ABDOMINAL HYSTERECTOMY    . [place]   to remove  blood clot after stroke   . CARDIAC CATHETERIZATION    . CERVICAL FUSION    . CHOLECYSTECTOMY N/A 10/14/2014   Procedure: LAPAROSCOPIC CHOLECYSTECTOMY WITH INTRAOPERATIVE CHOLANGIOGRAM;  Surgeon: Jackolyn Confer, MD;  Location: [place] [facility];  Service: General;  Laterality: N/A;  . EUS N/A 08/21/2015   Procedure: ESOPHAGEAL ENDOSCOPIC ULTRASOUND (EUS) RADIAL;  Surgeon: Carol Ada, MD;  [facility];  Service: Endoscopy;  Laterality: N/A;  . LEFT HEART CATHETERIZATION WITH CORONARY ANGIOGRAM N/A 09/23/2011   Procedure: LEFT HEART CATHETERIZATION WITH CORONARY ANGIOGRAM;  Surgeon: Thayer Headings, MD;  [facility];  Service: Cardiovascular;  Laterality: N/A;  . LIPOMA EXCISION Left 11/18/2013   Procedure: EXCISION OF SOFT TISSUE MASS-LEFT THIGH;  Surgeon: Jamesetta So, MD;  Location: [facility];  Service: General;  Laterality: Left;  . RECTOCELE REPAIR     x2  . RECTOCELE REPAIR N/A 04/04/2017   Procedure: POSTERIOR REPAIR (RECTOCELE);  Surgeon: Jonnie Kind, MD;  Location: [facility];  Service: Gynecology;  Laterality: N/A;    Current Outpatient Prescriptions on File Prior to Visit  Medication Sig Dispense Refill  . ALPRAZolam (XANAX) 1 MG tablet Take 1 tablet (1 mg total) by mouth 3 (three) times daily. 90 tablet 2  . ARIPiprazole (ABILIFY) 2 MG tablet Take 1 tablet (2 mg total) by mouth daily. 90 tablet 2  .  Ascorbic Acid (VITAMIN C) 1000 MG tablet Take 1,000 mg by mouth daily.    . B Complex-C (SUPER B COMPLEX PO) Take 1 tablet by mouth daily.     . cetirizine (ZYRTEC) 10 MG tablet Take 10 mg by mouth daily.    . cycloSPORINE (RESTASIS) 0.05 % ophthalmic emulsion Place 1 drop into both eyes 2 (two) times daily as needed.    . Evening Primrose Oil CAPS Take 1 capsule by mouth daily.    Marland Kitchen FLUoxetine (PROZAC) 20 MG capsule Take 1 capsule (20 mg total) by mouth daily. 90 capsule 2  . HYDROcodone-acetaminophen (NORCO/VICODIN) 5-325 MG tablet Take one-two tabs po q 4-6 hrs prn pain 20  tablet 0  . lisinopril (PRINIVIL,ZESTRIL) 20 MG tablet Take 20 mg by mouth daily.     . methylphenidate (RITALIN) 10 MG tablet Take 1 tablet (10 mg total) by mouth 2 (two) times daily with breakfast and lunch. 60 tablet 0  . methylphenidate (RITALIN) 10 MG tablet Take 1 tablet (10 mg total) by mouth 2 (two) times daily with breakfast and lunch. 60 tablet 0  . Multiple Vitamin (MULITIVITAMIN WITH MINERALS) TABS Take 1 tablet by mouth daily.    Marland Kitchen nystatin (MYCOSTATIN) powder APPLY FOUR TIMES DAILY AS NEEDED. (Patient taking differently: APPLY FOUR TIMES DAILY AS NEEDED YEAST RASH) 60 g 0  . pantoprazole (PROTONIX) 40 MG tablet Take 40 mg by mouth 2 (two) times daily.    . polyethylene glycol powder (GLYCOLAX/MIRALAX) powder Take 17 g by mouth daily. 3350 g 6  . Potassium 99 MG TABS Take 1 tablet by mouth daily.    . simvastatin (ZOCOR) 20 MG tablet TAKE ONE TABLET BY MOUTH ONCE DAILY AT BEDTIME 30 tablet 6  . traZODone (DESYREL) 100 MG tablet Take 1 tablet (100 mg total) by mouth at bedtime. 90 tablet 2  . acetaminophen (TYLENOL) 500 MG tablet Take 1,000 mg by mouth every 6 (six) hours as needed for moderate pain.     . Magnesium 250 MG TABS Take 250 mg by mouth daily.     . methocarbamol (ROBAXIN) 500 MG tablet Take 1 tablet (500 mg total) by mouth 3 (three) times daily. (Patient not taking: Reported on 03/29/2017) 21 tablet 0   No current facility-administered medications on file prior to visit.     Social History   Social History  . Marital status: Married    Spouse name: N/A  . Number of children: 0  . Years of education: HS   Occupational History  . unemployed    Social History Main Topics  . Smoking status: Former Smoker    Packs/day: 1.00    Years: 19.00    Types: Cigarettes    Quit date: 09/01/1997  . Smokeless tobacco: Never Used     Comment: Quit smoking 1999 , previous 20 pack years  . Alcohol use Yes     Comment: 1 drink every other week  . Drug use: No  . Sexual  activity: Not Currently    Partners: Male    Birth control/ protection: Surgical   Other Topics Concern  . Not on file   Social History Narrative   Bus Driver   Lives in [place]   Married      Patient drinks 1 cup of caffeine daily.   Patient is right handed.     Family History  Problem Relation Age of Onset  . Cancer Mother        breast   .  Hypertension Mother   . Hyperlipidemia Mother   . Depression Mother   . Anxiety disorder Mother   . COPD Mother   . Drug abuse Sister   . Coronary artery disease Paternal Grandfather   . Coronary artery disease Paternal Uncle   . Depression Cousin   . Drug abuse Cousin     BP 110/76   Pulse 71   Temp (!) 97.2 F (36.2 C)   Ht 5' 3"  (1.6 m)   Wt 147 lb (66.7 kg)   BMI 26.04 kg/m      Objective:   Physical Exam  Constitutional: She is oriented to person, place, and time. She appears well-developed and well-nourished.  HENT:  Head: Normocephalic and atraumatic.  Eyes: Pupils are equal, round, and reactive to light. Conjunctivae and EOM are normal.  Neck: Normal range of motion. Neck supple.  Cardiovascular: Normal rate, regular rhythm and intact distal pulses.   Pulmonary/Chest: Effort normal.  Abdominal: Soft.  Musculoskeletal: She exhibits tenderness (Parin right knee, very slight effusion, crepitus, ROM 0 to 110, pain medially, medial positve McMurray, limp right, NV intact.  Left knee tender but negattive.).  Neurological: She is alert and oriented to person, place, and time. She displays normal reflexes. No cranial nerve deficit. She exhibits normal muscle tone. Coordination normal.  Skin: Skin is warm and dry.  Psychiatric: She has a normal mood and affect. Her behavior is normal. Judgment and thought content normal.  Vitals reviewed.  X-rays of the right knee were done, reported separately.       Assessment & Plan:   Encounter Diagnoses  Name Primary?  . Chronic pain of right knee Yes  . Chronic pain of  left knee    PROCEDURE NOTE:  The patient requests injections of the right knee , verbal consent was obtained.  The right knee was prepped appropriately after time out was performed.   Sterile technique was observed and injection of 1 cc of Depo-Medrol 40 mg with several cc's of plain xylocaine. Anesthesia was provided by ethyl chloride and a 20-gauge needle was used to inject the knee area. The injection was tolerated well.  A band aid dressing was applied.  The patient was advised to apply ice later today and tomorrow to the injection sight as needed.  I would like to get a MRI of the right knee as she still has problems and locking despite conservative treatment, has positive McMurray.  Return after MRI.  Call if any problem.  Precautions discussed.   Electronically Signed Sanjuana Kava, MD 11/1/20182:38 PM

## 2017-05-05 ENCOUNTER — Other Ambulatory Visit: Payer: Self-pay | Admitting: Radiology

## 2017-05-05 ENCOUNTER — Telehealth: Payer: Self-pay | Admitting: Radiology

## 2017-05-05 NOTE — Telephone Encounter (Signed)
Called patient to advise of MRI scheduling Nov 8th at 3pm at [facility]   And schedule appt with Dr Luna Glasgow   Left message for her to call us back

## 2017-05-09 ENCOUNTER — Ambulatory Visit (INDEPENDENT_AMBULATORY_CARE_PROVIDER_SITE_OTHER): Payer: 59 | Admitting: Family Medicine

## 2017-05-09 ENCOUNTER — Encounter: Payer: Self-pay | Admitting: Family Medicine

## 2017-05-09 VITALS — BP 124/78 | HR 84 | Temp 98.1°F | Resp 16 | Ht 63.0 in | Wt 149.1 lb

## 2017-05-09 DIAGNOSIS — E785 Hyperlipidemia, unspecified: Secondary | ICD-10-CM

## 2017-05-09 DIAGNOSIS — R739 Hyperglycemia, unspecified: Secondary | ICD-10-CM | POA: Diagnosis not present

## 2017-05-09 DIAGNOSIS — I10 Essential (primary) hypertension: Secondary | ICD-10-CM

## 2017-05-09 DIAGNOSIS — G8929 Other chronic pain: Secondary | ICD-10-CM

## 2017-05-09 DIAGNOSIS — M545 Low back pain: Secondary | ICD-10-CM

## 2017-05-09 DIAGNOSIS — M5136 Other intervertebral disc degeneration, lumbar region: Secondary | ICD-10-CM | POA: Diagnosis not present

## 2017-05-09 NOTE — Progress Notes (Signed)
Chief Complaint  Patient presents with  . Hyperlipidemia   [name] is a new patient to the office. She is on multiple medications for depression anxiety and insomnia.  She also has some adult attention deficit.  She takes Prozac and Abilify, trazodone to sleep, Ritalin, and daily Xanax.  She is under the care of a psychiatrist.  She states she feels like she is stable. She has hypertension and hyperlipidemia.  She is on lisinopril, and statin Zocor for cholesterol medication.  She is uncertain when her last blood work was.  Her blood pressure is well controlled. She has a history of a stroke from having an aneurysm burst in her brain.  This left her with some memory difficulty.  She is feels unable to work. She has chronic low back pain.  She takes Norco twice a day.  I explained to her that I do not do chronic narcotic management.  Further I do recommend she take Norco on a daily basis for chronic pain.  I explained to her that Norco is not indicated in combination with Xanax.  She told me that she will try to take extra strength Tylenol instead.  If she feels she needs additional pain medication she would be referred to pain management. She had a hysterectomy for "cancer cells" on her cervix.  She is under the care of Dr. Glo Herring in OB/GYN.  She has had recent rectocele surgery.  She feels like it is successful. She has not been getting regular exercise but [facility]  She is going to start going twice a week.  I told her to try to work up to 4 5 times a week, or walk at home on alternate days. She has irritable bowel and acid reflux.  She takes Protonix and MiraLAX.  She has been cared for by gastroenterology.  She states she is currently stable. She is up-to-date with all her health testing.  She is up-to-date with her immunizations.   Patient Active Problem List   Diagnosis Date Noted  . IBS (irritable bowel syndrome) 05/11/2015  . Gait instability 01/06/2015  .  Memory changes 01/06/2015  . Dyspareunia, female 12/11/2014  . DDD (degenerative disc disease), lumbar 02/18/2014  . s/p hemorrhagic CVA (cerebral infarction) from "leaking BV" 11/01/2013  . Postmenopausal atrophic vaginitis 11/01/2013  . Lipoma of abdominal wall 08/14/2013  . Back pain 05/29/2013  . Paresthesia of foot 02/12/2013  . Elevated blood sugar 02/12/2013  . Hypertension   . GERD (gastroesophageal reflux disease)   . Depression 11/17/2011  . Insomnia 11/17/2011  . Hyperlipidemia 09/20/2011  . Anxiety 09/20/2011    Outpatient Encounter Medications as of 05/09/2017  Medication Sig  . acetaminophen (TYLENOL) 500 MG tablet Take 1,000 mg by mouth every 6 (six) hours as needed for moderate pain.   Marland Kitchen ALPRAZolam (XANAX) 1 MG tablet Take 1 tablet (1 mg total) by mouth 3 (three) times daily.  . ARIPiprazole (ABILIFY) 2 MG tablet Take 1 tablet (2 mg total) by mouth daily.  . Ascorbic Acid (VITAMIN C) 1000 MG tablet Take 1,000 mg by mouth daily.  . B Complex-C (SUPER B COMPLEX PO) Take 1 tablet by mouth daily.   . cetirizine (ZYRTEC) 10 MG tablet Take 10 mg by mouth daily.  . cycloSPORINE (RESTASIS) 0.05 % ophthalmic emulsion Place 1 drop into both eyes 2 (two) times daily as needed.  Marland Kitchen FLUoxetine (PROZAC) 20 MG capsule Take 1 capsule (20 mg total) by mouth daily.  Marland Kitchen  lisinopril (PRINIVIL,ZESTRIL) 20 MG tablet Take 20 mg by mouth daily.   . Magnesium 250 MG TABS Take 250 mg by mouth daily.   . methylphenidate (RITALIN) 10 MG tablet Take 1 tablet (10 mg total) by mouth 2 (two) times daily with breakfast and lunch.  . Multiple Vitamin (MULITIVITAMIN WITH MINERALS) TABS Take 1 tablet by mouth daily.  . pantoprazole (PROTONIX) 40 MG tablet Take 40 mg by mouth 2 (two) times daily.  . polyethylene glycol powder (GLYCOLAX/MIRALAX) powder Take 17 g by mouth daily.  . Potassium 99 MG TABS Take 1 tablet by mouth daily.  . simvastatin (ZOCOR) 20 MG tablet TAKE ONE TABLET BY MOUTH ONCE DAILY AT  BEDTIME  . traZODone (DESYREL) 100 MG tablet Take 1 tablet (100 mg total) by mouth at bedtime.   No facility-administered encounter medications on file as of 05/09/2017.     Past Medical History:  Diagnosis Date  . Allergy    grass, dust , mold  . Anxiety   . Arthritis   . Bipolar disorder ([place])   . Cancer ([place])   . Carpal tunnel syndrome    Bilateral  . Chest pain 09/2011   Cardiac cath-normal coronaries  . Constipation   . Depression   . Difficulty urinating 05/31/2013  . Elevated LFTs 12/16/2013  . GERD (gastroesophageal reflux disease)   . History of kidney stones   . Hyperlipemia   . Hyperlipidemia   . Hypertension    Mild; provoked by stress and anxiety  . IBS (irritable bowel syndrome)   . Intracerebral bleed ([place])    No aneurysm; followed by Dr. Sherwood Gambler  . Loss of weight 01/06/2015  . Stroke [facility]) 1999   hemorrhagic stroke; weakness of left side    Past Surgical History:  Procedure Laterality Date  . ABDOMINAL HYSTERECTOMY     "cancer cells"  . [place]   to remove blood clot after stroke   . CARDIAC CATHETERIZATION    . CERVICAL FUSION    . RECTOCELE REPAIR     x2    Social History   Socioeconomic History  . Marital status: Married    Spouse name: Sonia Side  . Number of children: 0  . Years of education: HS  . Highest education level: Not on file  Social Needs  . Financial resource strain: Not hard at all  . Food insecurity - worry: Never true  . Food insecurity - inability: Never true  . Transportation needs - medical: No  . Transportation needs - non-medical: No  Occupational History  . Occupation: unemployed    Comment: pending disability  Tobacco Use  . Smoking status: Former Smoker    Packs/day: 1.00    Years: 19.00    Pack years: 19.00    Types: Cigarettes    Last attempt to quit: 09/01/1997    Years since quitting: 19.6  . Smokeless tobacco: Never Used  . Tobacco comment: Quit smoking 1999 , previous 20 pack years  Substance  and Sexual Activity  . Alcohol use: Yes    Comment: 1 drink every other week  . Drug use: No  . Sexual activity: Not Currently    Partners: Male    Birth control/protection: Surgical  Other Topics Concern  . Not on file  Social History Narrative   Currently unable to work   Lives in [place]   Married   Patient drinks 1 cup of caffeine daily.   Patient is right handed.    [facility] [employer] [facility]    Family History  Problem Relation Age of Onset  . Cancer Mother        breast   . Hypertension Mother   . Hyperlipidemia Mother   . Depression Mother   . Anxiety disorder Mother   . COPD Mother   . Arthritis Mother        rheumatoid  . Drug abuse Sister   . Coronary artery disease Paternal Grandfather   . Coronary artery disease Paternal Uncle   . Depression Cousin   . Drug abuse Cousin     Review of Systems  Constitutional: Positive for malaise/fatigue. Negative for chills, fever and weight loss.  HENT: Negative for congestion and hearing loss.   Eyes: Negative for blurred vision and pain.  Respiratory: Negative for cough and shortness of breath.   Cardiovascular: Negative for chest pain and leg swelling.  Gastrointestinal: Positive for abdominal pain and constipation. Negative for diarrhea and heartburn.  Genitourinary: Negative for dysuria and frequency.       Dyspareunia  Musculoskeletal: Positive for back pain and neck pain. Negative for falls, joint pain and myalgias.  Neurological: Positive for focal weakness. Negative for dizziness, seizures and headaches.  Psychiatric/Behavioral: Positive for depression. The patient is nervous/anxious and has insomnia.     BP 124/78 (BP Location: Left Arm, Patient Position: Sitting, Cuff Size: Normal)   Pulse 84   Temp 98.1 F (36.7 C) (Temporal)   Resp 16   Ht 5' 3"  (1.6 m)   Wt 149 lb 1.9 oz (67.6 kg)   SpO2 98%   BMI 26.42 kg/m   Physical Exam  Constitutional: She is oriented to person, place, and time.  She appears well-developed and well-nourished.  Moves without discomfort.  Normal gait  HENT:  Head: Normocephalic and atraumatic.  Mouth/Throat: Oropharynx is clear and moist.  Eyes: Conjunctivae are normal. Pupils are equal, round, and reactive to light.  Neck: Normal range of motion. Neck supple. No thyromegaly present.  Cardiovascular: Normal rate, regular rhythm and normal heart sounds.  Pulmonary/Chest: Effort normal and breath sounds normal. No respiratory distress.  Abdominal: Soft. Bowel sounds are normal.  Musculoskeletal: Normal range of motion. She exhibits no edema.  Lymphadenopathy:    She has no cervical adenopathy.  Neurological: She is alert and oriented to person, place, and time.  Skin: Skin is warm and dry.  Psychiatric: Her behavior is normal. Thought content normal.  Depressed mood and affect.  Hesitant speech  Nursing note and vitals reviewed. ASSESSMENT/PLAN:   1. Essential hypertension Blood pressure well controlled - CBC with Differential/Platelet - COMPLETE METABOLIC PANEL WITH GFR - Urinalysis, Routine w reflex microscopic  2. Hyperlipidemia, unspecified hyperlipidemia type Compliant with Zocor - Lipid panel  3. Elevated blood sugar Patient seems unaware.  She has not on a restricted diet - Hemoglobin A1c  4. DDD (degenerative disc disease), lumbar Chronic pain.  Also fibromyalgia - VITAMIN D 25 Hydroxy (Vit-D Deficiency, Fractures)  5. Chronic midline low back pain without sciatica    Patient Instructions  Need old records Walk every day that you are able  I have ordered blood work when it is due Try to take acetaminophen ( tylenol) for pain May take ibuprofen with food in limited fashion (watch for stomach upset) Try to reduce the pantoprazole to 40 mg once a day  I can refer you inf you need more pain management  See me in a month    Raylene Everts,  MD

## 2017-05-09 NOTE — Patient Instructions (Signed)
Need old records Walk every day that you are able  I have ordered blood work when it is due Try to take acetaminophen ( tylenol) for pain May take ibuprofen with food in limited fashion (watch for stomach upset) Try to reduce the pantoprazole to 40 mg once a day  I can refer you inf you need more pain management  See me in a month

## 2017-05-10 ENCOUNTER — Encounter: Payer: 59 | Admitting: Obstetrics and Gynecology

## 2017-05-10 ENCOUNTER — Telehealth: Payer: Self-pay | Admitting: Radiology

## 2017-05-10 NOTE — Telephone Encounter (Signed)
I have called [employer] today to update the authorization of MRI scan, the location for the scan was [facility]. It will be at [place] has given me a new auth number for the MRI scan and has approved for [facility] system.   [id number] case [id number] good for 45 days   I called [facility] to give updated Auth number, central scheduling

## 2017-05-11 ENCOUNTER — Ambulatory Visit (HOSPITAL_COMMUNITY)
Admission: RE | Admit: 2017-05-11 | Discharge: 2017-05-11 | Disposition: A | Discharge status: Home / Self Care | Payer: 59 | Source: Ambulatory Visit | Attending: Orthopaedic Surgery | Admitting: Orthopaedic Surgery

## 2017-05-11 DIAGNOSIS — M23221 Derangement of posterior horn of medial meniscus due to old tear or injury, right knee: Secondary | ICD-10-CM | POA: Diagnosis not present

## 2017-05-11 DIAGNOSIS — G8929 Other chronic pain: Secondary | ICD-10-CM | POA: Diagnosis present

## 2017-05-11 DIAGNOSIS — M2410 Other articular cartilage disorders, unspecified site: Secondary | ICD-10-CM | POA: Insufficient documentation

## 2017-05-11 DIAGNOSIS — M25561 Pain in right knee: Secondary | ICD-10-CM | POA: Diagnosis present

## 2017-05-18 ENCOUNTER — Encounter (HOSPITAL_COMMUNITY): Payer: Self-pay | Admitting: Physical Therapy

## 2017-05-18 NOTE — Therapy (Signed)
[place], [place], [address] Phone: [phone]   Fax:  [phone]  Patient Details  Name: [name] MRN: [id number] Date of Birth: 1961-06-01 Referring Provider:  No ref. provider found  Encounter Date: 05/18/2017   PHYSICAL THERAPY DISCHARGE SUMMARY  Visits from Start of Care:1  Current functional level related to goals / functional outcomes: Patient discharged to not returning to this clinic following evaluation    Remaining deficits: Unable to assess    Education / Equipment: None   Plan: Patient agrees to discharge.  Patient goals were not met. Patient is being discharged due to not returning since the last visit.  ?????     [name] PT, DPT, CBIS  Supplemental Physical Therapist [facility] [address] [place], [place], [address] Phone: [phone]   Fax:  [phone]

## 2017-05-22 ENCOUNTER — Ambulatory Visit (INDEPENDENT_AMBULATORY_CARE_PROVIDER_SITE_OTHER): Payer: 59 | Admitting: Psychiatry

## 2017-05-22 ENCOUNTER — Encounter (HOSPITAL_COMMUNITY): Payer: Self-pay | Admitting: Psychiatry

## 2017-05-22 VITALS — BP 128/76 | HR 81 | Ht 63.0 in | Wt 147.0 lb

## 2017-05-22 DIAGNOSIS — Z813 Family history of other psychoactive substance abuse and dependence: Secondary | ICD-10-CM

## 2017-05-22 DIAGNOSIS — Z818 Family history of other mental and behavioral disorders: Secondary | ICD-10-CM

## 2017-05-22 DIAGNOSIS — Z87891 Personal history of nicotine dependence: Secondary | ICD-10-CM

## 2017-05-22 DIAGNOSIS — R413 Other amnesia: Secondary | ICD-10-CM

## 2017-05-22 DIAGNOSIS — F332 Major depressive disorder, recurrent severe without psychotic features: Secondary | ICD-10-CM

## 2017-05-22 MED ORDER — FLUOXETINE HCL 20 MG PO CAPS: 20.0000 mg | ORAL_CAPSULE | Freq: Every day | ORAL | 2 refills | Status: DC

## 2017-05-22 MED ORDER — ALPRAZOLAM 1 MG PO TABS: 1.0000 mg | ORAL_TABLET | Freq: Three times a day (TID) | ORAL | 2 refills | Status: DC

## 2017-05-22 MED ORDER — ARIPIPRAZOLE 2 MG PO TABS: 2.0000 mg | ORAL_TABLET | Freq: Every day | ORAL | 2 refills | Status: DC

## 2017-05-22 MED ORDER — TRAZODONE HCL 100 MG PO TABS: 100.0000 mg | ORAL_TABLET | Freq: Every day | ORAL | 2 refills | Status: DC

## 2017-05-22 MED ORDER — METHYLPHENIDATE HCL 10 MG PO TABS: 10.0000 mg | ORAL_TABLET | Freq: Two times a day (BID) | ORAL | 0 refills | Status: DC

## 2017-05-22 NOTE — Progress Notes (Signed)
[place] MD/PA/NP OP Progress Note  05/22/2017 1:56 PM [name]  MRN:  [id number]  Chief Complaint:  Chief Complaint    Depression; Anxiety; Follow-up     HPI: this patient is a 56 year old married white female who lives with her husband in [place]. She has no children. 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  The patient was referred by her primary physician, Dr. [name], for further assessment and treatment of depression anxiety and focus problems.  The patient states that she did well most of her life until she had a stroke in 50 at age 6. She had a cerebral aneurysm that burst and she had to have a hematoma evacuated from the right frontal lobe. She has had resulting difficulties ever since and still has weakness on the left side of her body and poor fine motor skills in her left hand. She is right handed. She states that she gets older her symptoms worsen.  The patient often feels anxious particular in crowds. She has frequent panic attacks. She takes Xanax 1 mg twice a day but is reluctant to take more. Shortly after she had her stroke she saw psychiatrist in [place] and was placed on the Xanax. Dr. [name] later put her on Effexor and she is now up to 150 mg per day. [name] not sure if it's helping. She has difficulty sleeping but trazodone helps to some degree. She also is significant problems with focus and attention span. Her thoughts ramble. She'll start one thing and go the next. She can't complete tasks. She finds this extremely frustrating. Her mood is labile at times and she'll get angry quickly and for no reason. She denies auditory or visual hallucinations or paranoia. She does not use drugs and very rarely takes a drink. She admits that time she has passive suicidal ideation but would never hurt her self because of her faith.  The patient returns after 2 months.  She states that she is having more  trouble with short-term memory.  She states that she would like to get [place] off of some of her medications and I explained that the most worrisome one in terms of memory is Xanax.  She claims she is "not ready to give that up yet."  She states that some days she takes less and I told her to try to err on the side of less rather than more.  She also is trying to cut back on her pain medication as well.  Her mood is fairly good and [facility]  She is still having anxiety and panic when she gets out around a lot of people but she is trying to stay active.  For the most part she is sleeping well Visit Diagnosis:    ICD-10-CM   1. Major depressive disorder, recurrent, severe without psychotic features ([place]) F33.2   2. Memory deficit R41.3     Past Psychiatric History: Past history of outpatient treatment for depression  Past Medical History:  Past Medical History:  Diagnosis Date  . Allergy    grass, dust , mold  . Anxiety   . Arthritis   . Bipolar disorder ([place])   . Cancer ([place])   . Carpal tunnel syndrome    Bilateral  . Chest pain 09/2011   Cardiac cath-normal coronaries  . Constipation   . Depression   . Difficulty urinating 05/31/2013  .  Elevated LFTs 12/16/2013  . GERD (gastroesophageal reflux disease)   . History of kidney stones   . Hyperlipemia   . Hyperlipidemia   . Hypertension    Mild; provoked by stress and anxiety  . IBS (irritable bowel syndrome)   . Intracerebral bleed ([place])    No aneurysm; followed by Dr. Sherwood Gambler  . Loss of weight 01/06/2015  . Stroke [facility]) 1999   hemorrhagic stroke; weakness of left side    Past Surgical History:  Procedure Laterality Date  . ABDOMINAL HYSTERECTOMY     "cancer cells"  . [place]   to remove blood clot after stroke   . CARDIAC CATHETERIZATION    . CERVICAL FUSION    . ESOPHAGEAL ENDOSCOPIC ULTRASOUND (EUS) RADIAL N/A 08/21/2015   Performed by Carol Ada, MD at [facility]  . EXCISION OF  SOFT TISSUE MASS-LEFT THIGH Left 11/18/2013   Performed by Jamesetta So, MD at [facility]  . LAPAROSCOPIC CHOLECYSTECTOMY WITH INTRAOPERATIVE CHOLANGIOGRAM N/A 10/14/2014   Performed by Jackolyn Confer, MD at [facility]  . LEFT HEART CATHETERIZATION WITH CORONARY ANGIOGRAM N/A 09/23/2011   Performed by Acie Fredrickson Wonda Cheng, MD at [facility] CATH LAB  . POSTERIOR REPAIR (RECTOCELE) N/A 04/04/2017   Performed by Jonnie Kind, MD at [facility]  . RECTOCELE REPAIR     x2    Family Psychiatric History: See below  Family History:  Family History  Problem Relation Age of Onset  . Cancer Mother        breast   . Hypertension Mother   . Hyperlipidemia Mother   . Depression Mother   . Anxiety disorder Mother   . COPD Mother   . Arthritis Mother        rheumatoid  . Drug abuse Sister   . Coronary artery disease Paternal Grandfather   . Coronary artery disease Paternal Uncle   . Depression Cousin   . Drug abuse Cousin     Social History:  Social History   Socioeconomic History  . Marital status: Married    Spouse name: Sonia Side  . Number of children: 0  . Years of education: HS  . Highest education level: None  Social Needs  . Financial resource strain: Not hard at all  . Food insecurity - worry: Never true  . Food insecurity - inability: Never true  . Transportation needs - medical: No  . Transportation needs - non-medical: No  Occupational History  . Occupation: unemployed    Comment: pending disability  Tobacco Use  . Smoking status: Former Smoker    Packs/day: 1.00    Years: 19.00    Pack years: 19.00    Types: Cigarettes    Last attempt to quit: 09/01/1997    Years since quitting: 19.7  . Smokeless tobacco: Never Used  . Tobacco comment: Quit smoking 1999 , previous 20 pack years  Substance and Sexual Activity  . Alcohol use: Yes    Comment: 1 drink every other week  . Drug use: No  . Sexual activity: Not Currently    Partners: Male    Birth control/protection: Surgical  Other  Topics Concern  . None  Social History Narrative   Currently unable to work   Lives in [place]   Married   Patient drinks 1 cup of caffeine daily.   Patient is right handed.    [facility] [employer] to get [facility]    Allergies:  Allergies  Allergen Reactions  . Morphine  And Related Hives  . Promethazine Hcl     Causes patient to become Hyper    Metabolic Disorder Labs: Lab Results  Component Value Date   HGBA1C 5.7 (H) 01/06/2015   MPG 117 (H) 01/06/2015   MPG 117 (H) 02/17/2014   No results found for: PROLACTIN Lab Results  Component Value Date   CHOL 169 09/16/2015   TRIG 130 09/16/2015   HDL 74 09/16/2015   CHOLHDL 2.3 09/16/2015   VLDL 26 09/16/2015   LDLCALC 69 09/16/2015   LDLCALC 84 05/11/2015   Lab Results  Component Value Date   TSH 1.438 09/20/2011    Therapeutic Level Labs: No results found for: LITHIUM No results found for: VALPROATE No components found for:  CBMZ  Current Medications: Current Outpatient Medications  Medication Sig Dispense Refill  . acetaminophen (TYLENOL) 500 MG tablet Take 1,000 mg by mouth every 6 (six) hours as needed for moderate pain.     Marland Kitchen ALPRAZolam (XANAX) 1 MG tablet Take 1 tablet (1 mg total) 3 (three) times daily by mouth. 90 tablet 2  . ARIPiprazole (ABILIFY) 2 MG tablet Take 1 tablet (2 mg total) daily by mouth. 90 tablet 2  . Ascorbic Acid (VITAMIN C) 1000 MG tablet Take 1,000 mg by mouth daily.    . B Complex-C (SUPER B COMPLEX PO) Take 1 tablet by mouth daily.     . cetirizine (ZYRTEC) 10 MG tablet Take 10 mg by mouth daily.    . cycloSPORINE (RESTASIS) 0.05 % ophthalmic emulsion Place 1 drop into both eyes 2 (two) times daily as needed.    Marland Kitchen FLUoxetine (PROZAC) 20 MG capsule Take 1 capsule (20 mg total) daily by mouth. 90 capsule 2  . lisinopril (PRINIVIL,ZESTRIL) 20 MG tablet Take 20 mg by mouth daily.     . Magnesium 250 MG TABS Take 250 mg by mouth daily.     . methylphenidate (RITALIN) 10 MG tablet Take 1  tablet (10 mg total) 2 (two) times daily with breakfast and lunch by mouth. 60 tablet 0  . Multiple Vitamin (MULITIVITAMIN WITH MINERALS) TABS Take 1 tablet by mouth daily.    . pantoprazole (PROTONIX) 40 MG tablet Take 40 mg by mouth 2 (two) times daily.    . polyethylene glycol powder (GLYCOLAX/MIRALAX) powder Take 17 g by mouth daily. 3350 g 6  . Potassium 99 MG TABS Take 1 tablet by mouth daily.    . simvastatin (ZOCOR) 20 MG tablet TAKE ONE TABLET BY MOUTH ONCE DAILY AT BEDTIME 30 tablet 6  . traZODone (DESYREL) 100 MG tablet Take 1 tablet (100 mg total) at bedtime by mouth. 90 tablet 2  . methylphenidate (RITALIN) 10 MG tablet Take 1 tablet (10 mg total) 2 (two) times daily with breakfast and lunch by mouth. 60 tablet 0   No current facility-administered medications for this visit.      Musculoskeletal: Strength & Muscle Tone: within normal limits Gait & Station: normal Patient leans: N/A  Psychiatric Specialty Exam: Review of Systems  Respiratory: Positive for cough.   Musculoskeletal: Positive for back pain and joint pain.  Psychiatric/Behavioral: The patient is nervous/anxious.   All other systems reviewed and are negative.   Blood pressure 128/76, pulse 81, height 5' 3"  (1.6 m), weight 147 lb (66.7 kg), SpO2 96 %.Body mass index is 26.04 kg/m.  General Appearance: Casual, Neat and Well Groomed  Eye Contact:  Good  Speech:  Clear and Coherent  Volume:  Normal  Mood:  Dysphoric  Affect:  Congruent  Thought Process:  Goal Directed  Orientation:  Full (Time, Place, and Person)  Thought Content: Rumination   Suicidal Thoughts:  No  Homicidal Thoughts:  No  Memory:  Immediate;   Poor Recent;   Poor Remote;   Poor  Judgement:  Fair  Insight:  Fair  Psychomotor Activity:  Decreased  Concentration:  Concentration: Fair and Attention Span: Fair  Recall:  Poor  Fund of Knowledge: Good  Language: Good  Akathisia:  No  Handed:  Right  AIMS (if indicated): not done   Assets:  Communication Skills Desire for Improvement Resilience Social Support Talents/Skills  ADL's:  Intact  Cognition: WNL  Sleep:  Fair   Screenings: MDI     Office Visit from 01/22/2016 in [facility]  Total Score (max 50)  34    Mini-Mental     Office Visit from 02/13/2015 in [facility]  Total Score (max 30 points )  26    PHQ2-9     Office Visit from 05/09/2017 in [facility] Office Visit from 09/16/2015 in [facility]  PHQ-2 Total Score  0  4  PHQ-9 Total Score  No data  10    SBQ-R     Office Visit from 01/22/2016 in [facility]  SBQ-R Total Score  14.1       Assessment and Plan: Patient is a 56 year old female with a history of depression anxiety and focus issues which were not related to a stroke several years ago.  She still has significant problems with memory which has been corroborated by the cognitive testing.  She is trying to get disability which has been a very slow process.  She claims she would like to decrease her psychiatric medications but there are none that she is willing to give up right now.  Therefore she will continue Prozac 20 mg daily, Abilify 2 mg daily both for mood stabilization, Xanax 1 mg up to 3 times a day for anxiety, trazodone 100 mg at bedtime for sleep and methylphenidate 10 mg twice a day with breakfast and lunch.  She will return to see me in 2 months   [name], MD 05/22/2017, 1:56 PM

## 2017-05-23 ENCOUNTER — Encounter: Payer: Self-pay | Admitting: Orthopaedic Surgery

## 2017-05-23 ENCOUNTER — Ambulatory Visit: Payer: Self-pay | Admitting: Orthopaedic Surgery

## 2017-05-31 ENCOUNTER — Ambulatory Visit (INDEPENDENT_AMBULATORY_CARE_PROVIDER_SITE_OTHER): Payer: 59

## 2017-05-31 ENCOUNTER — Ambulatory Visit: Payer: 59 | Admitting: Orthopaedic Surgery

## 2017-05-31 ENCOUNTER — Encounter: Payer: Self-pay | Admitting: Orthopaedic Surgery

## 2017-05-31 VITALS — BP 128/75 | HR 87 | Temp 98.4°F | Ht 63.0 in | Wt 149.0 lb

## 2017-05-31 DIAGNOSIS — M25561 Pain in right knee: Secondary | ICD-10-CM

## 2017-05-31 DIAGNOSIS — M25562 Pain in left knee: Secondary | ICD-10-CM | POA: Diagnosis not present

## 2017-05-31 DIAGNOSIS — G8929 Other chronic pain: Secondary | ICD-10-CM

## 2017-05-31 NOTE — Progress Notes (Addendum)
Patient VO:HYWVP [name], female DOB:1961-05-05, 56 y.o. XTG:[id number]  Chief Complaint  Patient presents with  . Results    MRI Right Knee. Continued pain in left knee.    HPI  [name] is a 56 y.o. female who has pain of the right knee.  She had MRI done and it showed: IMPRESSION: Horizontal tear posterior horn medial meniscus reaches the meniscal undersurface. Tearing along the capsular surface of the meniscus in the posterior body is also identified.  Small blister in hyaline cartilage along the medial patellar facet adjacent to a medial plica.  I have explained the findings to her and recommended arthroscopy.  I will have Dr. Aline Brochure see her.  She complains of similar problem of left knee pain, swelling and giving way today.  I will get a MRI of the left knee. HPI  Body mass index is 26.39 kg/m.  ROS  Review of Systems  HENT: Negative for congestion.   Respiratory: Positive for shortness of breath. Negative for cough.   Cardiovascular: Negative for chest pain and leg swelling.  Endocrine: Positive for cold intolerance.  Musculoskeletal: Positive for arthralgias, back pain, gait problem and joint swelling.  Allergic/Immunologic: Positive for environmental allergies.  Psychiatric/Behavioral: The patient is nervous/anxious.   All other systems reviewed and are negative.   Past Medical History:  Diagnosis Date  . Allergy    grass, dust , mold  . Anxiety   . Arthritis   . Bipolar disorder ([place])   . Cancer ([place])   . Carpal tunnel syndrome    Bilateral  . Chest pain 09/2011   Cardiac cath-normal coronaries  . Constipation   . Depression   . Difficulty urinating 05/31/2013  . Elevated LFTs 12/16/2013  . GERD (gastroesophageal reflux disease)   . History of kidney stones   . Hyperlipemia   . Hyperlipidemia   . Hypertension    Mild; provoked by stress and anxiety  . IBS (irritable bowel syndrome)   . Intracerebral bleed ([place])    No aneurysm;  followed by Dr. Sherwood Gambler  . Loss of weight 01/06/2015  . Stroke [facility]) 1999   hemorrhagic stroke; weakness of left side    Past Surgical History:  Procedure Laterality Date  . ABDOMINAL HYSTERECTOMY     "cancer cells"  . [place]   to remove blood clot after stroke   . CARDIAC CATHETERIZATION    . CERVICAL FUSION    . CHOLECYSTECTOMY N/A 10/14/2014   Procedure: LAPAROSCOPIC CHOLECYSTECTOMY WITH INTRAOPERATIVE CHOLANGIOGRAM;  Surgeon: Jackolyn Confer, MD;  Location: [place];  Service: General;  Laterality: N/A;  . EUS N/A 08/21/2015   Procedure: ESOPHAGEAL ENDOSCOPIC ULTRASOUND (EUS) RADIAL;  Surgeon: Carol Ada, MD;  Location: [facility] ENDOSCOPY;  Service: Endoscopy;  Laterality: N/A;  . LEFT HEART CATHETERIZATION WITH CORONARY ANGIOGRAM N/A 09/23/2011   Procedure: LEFT HEART CATHETERIZATION WITH CORONARY ANGIOGRAM;  Surgeon: Thayer Headings, MD;  Location: [facility] CATH LAB;  Service: Cardiovascular;  Laterality: N/A;  . LIPOMA EXCISION Left 11/18/2013   Procedure: EXCISION OF SOFT TISSUE MASS-LEFT THIGH;  Surgeon: Jamesetta So, MD;  Location: [facility] ORS;  Service: General;  Laterality: Left;  . RECTOCELE REPAIR     x2  . RECTOCELE REPAIR N/A 04/04/2017   Procedure: POSTERIOR REPAIR (RECTOCELE);  Surgeon: Jonnie Kind, MD;  Location: [facility] OR[facility];  Service: Gynecology;  Laterality: N/A;    Family History  Problem Relation Age of Onset  . Cancer Mother  breast   . Hypertension Mother   . Hyperlipidemia Mother   . Depression Mother   . Anxiety disorder Mother   . COPD Mother   . Arthritis Mother        rheumatoid  . Drug abuse Sister   . Coronary artery disease Paternal Grandfather   . Coronary artery disease Paternal Uncle   . Depression Cousin   . Drug abuse Cousin     Social History Social History   Tobacco Use  . Smoking status: Former Smoker    Packs/day: 1.00    Years: 19.00    Pack years: 19.00    Types: Cigarettes    Last attempt to quit: 09/01/1997    Years  since quitting: 19.7  . Smokeless tobacco: Never Used  . Tobacco comment: Quit smoking 1999 , previous 20 pack years  Substance Use Topics  . Alcohol use: Yes    Comment: 1 drink every other week  . Drug use: No    Allergies  Allergen Reactions  . Morphine And Related Hives  . Promethazine Hcl     Causes patient to become Hyper    Current Outpatient Medications  Medication Sig Dispense Refill  . acetaminophen (TYLENOL) 500 MG tablet Take 1,000 mg by mouth every 6 (six) hours as needed for moderate pain.     Marland Kitchen ALPRAZolam (XANAX) 1 MG tablet Take 1 tablet (1 mg total) 3 (three) times daily by mouth. 90 tablet 2  . ARIPiprazole (ABILIFY) 2 MG tablet Take 1 tablet (2 mg total) daily by mouth. 90 tablet 2  . Ascorbic Acid (VITAMIN C) 1000 MG tablet Take 1,000 mg by mouth daily.    . B Complex-C (SUPER B COMPLEX PO) Take 1 tablet by mouth daily.     . cetirizine (ZYRTEC) 10 MG tablet Take 10 mg by mouth daily.    . cycloSPORINE (RESTASIS) 0.05 % ophthalmic emulsion Place 1 drop into both eyes 2 (two) times daily as needed.    Marland Kitchen FLUoxetine (PROZAC) 20 MG capsule Take 1 capsule (20 mg total) daily by mouth. 90 capsule 2  . lisinopril (PRINIVIL,ZESTRIL) 20 MG tablet Take 20 mg by mouth daily.     . Magnesium 250 MG TABS Take 250 mg by mouth daily.     . methylphenidate (RITALIN) 10 MG tablet Take 1 tablet (10 mg total) 2 (two) times daily with breakfast and lunch by mouth. 60 tablet 0  . methylphenidate (RITALIN) 10 MG tablet Take 1 tablet (10 mg total) 2 (two) times daily with breakfast and lunch by mouth. 60 tablet 0  . Multiple Vitamin (MULITIVITAMIN WITH MINERALS) TABS Take 1 tablet by mouth daily.    . pantoprazole (PROTONIX) 40 MG tablet Take 40 mg by mouth 2 (two) times daily.    . polyethylene glycol powder (GLYCOLAX/MIRALAX) powder Take 17 g by mouth daily. 3350 g 6  . Potassium 99 MG TABS Take 1 tablet by mouth daily.    . simvastatin (ZOCOR) 20 MG tablet TAKE ONE TABLET BY MOUTH  ONCE DAILY AT BEDTIME 30 tablet 6  . traZODone (DESYREL) 100 MG tablet Take 1 tablet (100 mg total) at bedtime by mouth. 90 tablet 2   No current facility-administered medications for this visit.      Physical Exam  Blood pressure 128/75, pulse 87, temperature 98.4 F (36.9 C), height 5' 3"  (1.6 m), weight 149 lb (67.6 kg).  Constitutional: overall normal hygiene, normal nutrition, well developed, normal grooming, normal body habitus. Assistive device:none  Musculoskeletal: gait and station Limp left, muscle tone and strength are normal, no tremors or atrophy is present.  .  Neurological: coordination overall normal.  Deep tendon reflex/nerve stretch intact.  Sensation normal.  Cranial nerves II-XII intact.   Skin:   Normal overall no scars, lesions, ulcers or rashes. No psoriasis.  Psychiatric: Alert and oriented x 3.  Recent memory intact, remote memory unclear.  Normal mood and affect. Well groomed.  Good eye contact.  Cardiovascular: overall no swelling, no varicosities, no edema bilaterally, normal temperatures of the legs and arms, no clubbing, cyanosis and good capillary refill.  Lymphatic: palpation is normal.  All other systems reviewed and are negative   The left lower extremity is examined:  Inspection:  Thigh:  Non-tender and no defects  Knee has swelling 1+ effusion.                        Joint tenderness is present                        Patient is tender over the medial joint line  Lower Leg:  Has normal appearance and no tenderness or defects  Ankle:  Non-tender and no defects  Foot:  Non-tender and no defects Range of Motion:  Knee:  Range of motion is: 0-110                        Crepitus is  present  Ankle:  Range of motion is normal. Strength and Tone:  The left lower extremity has normal strength and tone. Stability:  Knee:  The knee has weakly positive medial [place] on the left.  Ankle:  The ankle is stable.   The patient has been educated  about the nature of the problem(s) and counseled on treatment options.  The patient appeared to understand what I have discussed and is in agreement with it.  Encounter Diagnoses  Name Primary?  . Chronic pain of left knee Yes  . Chronic pain of right knee    X-rays were done of the left knee, reported separately.  PLAN Call if any problems.  Precautions discussed.  Continue current medications.   PROCEDURE NOTE:  The patient requests injections of the left knee , verbal consent was obtained.  The left knee was prepped appropriately after time out was performed.   Sterile technique was observed and injection of 1 cc of Depo-Medrol 40 mg with several cc's of plain xylocaine. Anesthesia was provided by ethyl chloride and a 20-gauge needle was used to inject the knee area. The injection was tolerated well.  A band aid dressing was applied.  The patient was advised to apply ice later today and tomorrow to the injection sight as needed.  Return to clinic to see Dr. Aline Brochure for the right knee, MRI of the left knee.   Electronically Signed Sanjuana Kava, MD 11/28/201810:44 AM

## 2017-05-31 NOTE — Addendum Note (Signed)
Addended by: Willette Pa on: 05/31/2017 10:50 AM   Modules accepted: Level of Service

## 2017-06-02 ENCOUNTER — Other Ambulatory Visit: Payer: Self-pay | Admitting: Family Medicine

## 2017-06-02 ENCOUNTER — Ambulatory Visit (HOSPITAL_COMMUNITY): Payer: 59

## 2017-06-03 LAB — COMPREHENSIVE METABOLIC PANEL
ALT: 17 IU/L (ref 0–32)
AST: 19 IU/L (ref 0–40)
Albumin/Globulin Ratio: 2.2 (ref 1.2–2.2)
Albumin: 5.1 g/dL (ref 3.5–5.5)
Alkaline Phosphatase: 80 IU/L (ref 39–117)
BUN/Creatinine Ratio: 17 (ref 9–23)
BUN: 19 mg/dL (ref 6–24)
Bilirubin Total: 0.9 mg/dL (ref 0.0–1.2)
CO2: 23 mmol/L (ref 20–29)
Calcium: 9.9 mg/dL (ref 8.7–10.2)
Chloride: 97 mmol/L (ref 96–106)
Creatinine, Ser: 1.13 mg/dL — ABNORMAL HIGH (ref 0.57–1.00)
GFR calc Af Amer: 64 mL/min/{1.73_m2} (ref >59)
GFR calc non Af Amer: 55 mL/min/{1.73_m2} — ABNORMAL LOW (ref >59)
Globulin, Total: 2.3 g/dL (ref 1.5–4.5)
Glucose: 98 mg/dL (ref 65–99)
Potassium: 4.8 mmol/L (ref 3.5–5.2)
Sodium: 140 mmol/L (ref 134–144)
Total Protein: 7.4 g/dL (ref 6.0–8.5)

## 2017-06-03 LAB — CBC/DIFF AMBIGUOUS DEFAULT
Basophils Absolute: 0 10*3/uL (ref 0.0–0.2)
Basos: 0 %
EOS (ABSOLUTE): 0.2 10*3/uL (ref 0.0–0.4)
Eos: 2 %
Hematocrit: 45.3 % (ref 34.0–46.6)
Hemoglobin: 14.9 g/dL (ref 11.1–15.9)
Immature Grans (Abs): 0 10*3/uL (ref 0.0–0.1)
Immature Granulocytes: 0 %
Lymphocytes Absolute: 2.1 10*3/uL (ref 0.7–3.1)
Lymphs: 19 %
MCH: 31.5 pg (ref 26.6–33.0)
MCHC: 32.9 g/dL (ref 31.5–35.7)
MCV: 96 fL (ref 79–97)
Monocytes Absolute: 0.6 10*3/uL (ref 0.1–0.9)
Monocytes: 6 %
Neutrophils Absolute: 7.8 10*3/uL — ABNORMAL HIGH (ref 1.4–7.0)
Neutrophils: 73 %
Platelets: 320 10*3/uL (ref 150–379)
RBC: 4.73 x10E6/uL (ref 3.77–5.28)
RDW: 13 % (ref 12.3–15.4)
WBC: 10.7 10*3/uL (ref 3.4–10.8)

## 2017-06-03 LAB — LIPID PANEL W/O CHOL/HDL RATIO
Cholesterol, Total: 206 mg/dL — ABNORMAL HIGH (ref 100–199)
HDL: 76 mg/dL (ref >39)
LDL Calculated: 102 mg/dL — ABNORMAL HIGH (ref 0–99)
Triglycerides: 141 mg/dL (ref 0–149)
VLDL Cholesterol Cal: 28 mg/dL (ref 5–40)

## 2017-06-03 LAB — URINALYSIS, ROUTINE W REFLEX MICROSCOPIC
Bilirubin, UA: NEGATIVE
Glucose, UA: NEGATIVE
Leukocytes, UA: NEGATIVE
Nitrite, UA: NEGATIVE
Protein, UA: NEGATIVE
RBC, UA: NEGATIVE
Specific Gravity, UA: >=1.03 — AB (ref 1.005–1.030)
Urobilinogen, Ur: 0.2 mg/dL (ref 0.2–1.0)
pH, UA: 5.5 (ref 5.0–7.5)

## 2017-06-03 LAB — HGB A1C W/O EAG: Hgb A1c MFr Bld: 5.3 % (ref 4.8–5.6)

## 2017-06-03 LAB — SPECIMEN STATUS REPORT

## 2017-06-03 LAB — VITAMIN D 25 HYDROXY (VIT D DEFICIENCY, FRACTURES): Vit D, 25-Hydroxy: 36.8 ng/mL (ref 30.0–100.0)

## 2017-06-06 ENCOUNTER — Encounter: Payer: Self-pay | Admitting: Radiology

## 2017-06-07 ENCOUNTER — Ambulatory Visit: Payer: Self-pay | Admitting: Orthopedic Surgery

## 2017-06-08 ENCOUNTER — Other Ambulatory Visit: Payer: Self-pay

## 2017-06-08 ENCOUNTER — Encounter: Payer: Self-pay | Admitting: Family Medicine

## 2017-06-08 ENCOUNTER — Ambulatory Visit: Payer: 59 | Admitting: Family Medicine

## 2017-06-08 VITALS — BP 136/78 | HR 76 | Temp 98.3°F | Resp 16 | Ht 63.0 in | Wt 148.1 lb

## 2017-06-08 DIAGNOSIS — E785 Hyperlipidemia, unspecified: Secondary | ICD-10-CM | POA: Diagnosis not present

## 2017-06-08 DIAGNOSIS — M5136 Other intervertebral disc degeneration, lumbar region: Secondary | ICD-10-CM | POA: Diagnosis not present

## 2017-06-08 DIAGNOSIS — I10 Essential (primary) hypertension: Secondary | ICD-10-CM | POA: Diagnosis not present

## 2017-06-08 DIAGNOSIS — Z1211 Encounter for screening for malignant neoplasm of colon: Secondary | ICD-10-CM

## 2017-06-08 NOTE — Patient Instructions (Signed)
No change in medicine See me in six months Need lab tests for next visit Cologuard ordered  Call sooner for problems

## 2017-06-08 NOTE — Progress Notes (Signed)
Chief Complaint  Patient presents with  . Follow-up    3 month   Patient is here for follow-up. Blood pressures well controlled. Health maintenance is reviewed.  She is overdue for colonoscopy.  Her last colonoscopy was normal except for diverticulosis.  She prefers to have a cologuard, reserving colonoscopy only for a positive test.  No family history of colon cancer. Patient has multiple orthopedic problems.  She feels that they limit her ability to exercise.  Her biggest complaint today is chronic low back pain.  We discussed conservative management includes exercise, weight control, over-the-counter medications. Patient has short-term memory impairment after her intracranial bleed.  She did have a "leaking blood vessel".  For this reason she has not on aspirin for post stroke thrombosis prevention. She is taking her statin medication.  Her last LDL was 103 She had recent surgery by Dr. Glo Herring, her GYN.  Rectocele repair.  It was successful and she feels well.   Patient Active Problem List   Diagnosis Date Noted  . IBS (irritable bowel syndrome) 05/11/2015  . Gait instability 01/06/2015  . Memory changes 01/06/2015  . Dyspareunia, female 12/11/2014  . DDD (degenerative disc disease), lumbar 02/18/2014  . s/p hemorrhagic CVA (cerebral infarction) from "leaking BV" 11/01/2013  . Postmenopausal atrophic vaginitis 11/01/2013  . Lipoma of abdominal wall 08/14/2013  . Back pain 05/29/2013  . Paresthesia of foot 02/12/2013  . Elevated blood sugar 02/12/2013  . Hypertension   . GERD (gastroesophageal reflux disease)   . Depression 11/17/2011  . Insomnia 11/17/2011  . Hyperlipidemia 09/20/2011  . Anxiety 09/20/2011    Outpatient Encounter Medications as of 06/08/2017  Medication Sig  . acetaminophen (TYLENOL) 500 MG tablet Take 1,000 mg by mouth every 6 (six) hours as needed for moderate pain.   Marland Kitchen ALPRAZolam (XANAX) 1 MG tablet Take 1 tablet (1 mg total) 3 (three) times daily  by mouth.  . ARIPiprazole (ABILIFY) 2 MG tablet Take 1 tablet (2 mg total) daily by mouth.  . Ascorbic Acid (VITAMIN C) 1000 MG tablet Take 1,000 mg by mouth daily.  . B Complex-C (SUPER B COMPLEX PO) Take 1 tablet by mouth daily.   . cetirizine (ZYRTEC) 10 MG tablet Take 10 mg by mouth daily.  . cycloSPORINE (RESTASIS) 0.05 % ophthalmic emulsion Place 1 drop into both eyes 2 (two) times daily as needed.  Marland Kitchen FLUoxetine (PROZAC) 20 MG capsule Take 1 capsule (20 mg total) daily by mouth.  Marland Kitchen lisinopril (PRINIVIL,ZESTRIL) 20 MG tablet Take 20 mg by mouth daily.   . Magnesium 250 MG TABS Take 250 mg by mouth daily.   . methylphenidate (RITALIN) 10 MG tablet Take 1 tablet (10 mg total) 2 (two) times daily with breakfast and lunch by mouth.  . Multiple Vitamin (MULITIVITAMIN WITH MINERALS) TABS Take 1 tablet by mouth daily.  . pantoprazole (PROTONIX) 40 MG tablet Take 40 mg by mouth 2 (two) times daily.  . polyethylene glycol powder (GLYCOLAX/MIRALAX) powder Take 17 g by mouth daily.  . Potassium 99 MG TABS Take 1 tablet by mouth daily.  . simvastatin (ZOCOR) 20 MG tablet TAKE ONE TABLET BY MOUTH ONCE DAILY AT BEDTIME  . traZODone (DESYREL) 100 MG tablet Take 1 tablet (100 mg total) at bedtime by mouth.  . [DISCONTINUED] methylphenidate (RITALIN) 10 MG tablet Take 1 tablet (10 mg total) 2 (two) times daily with breakfast and lunch by mouth.   No facility-administered encounter medications on file as of 06/08/2017.  Allergies  Allergen Reactions  . Morphine And Related Hives  . Promethazine Hcl     Causes patient to become Hyper    Review of Systems  Constitutional: Positive for fatigue. Negative for activity change, appetite change and unexpected weight change.  HENT: Negative for congestion, dental problem, postnasal drip and rhinorrhea.   Eyes: Negative for redness and visual disturbance.  Respiratory: Negative for cough and shortness of breath.   Cardiovascular: Negative for chest pain,  palpitations and leg swelling.  Gastrointestinal: Negative for abdominal pain, blood in stool, constipation and diarrhea.  Genitourinary: Negative for difficulty urinating and frequency.  Musculoskeletal: Positive for back pain. Negative for arthralgias.  Neurological: Negative for dizziness and headaches.  Psychiatric/Behavioral: Positive for dysphoric mood and sleep disturbance. The patient is nervous/anxious.        Under care of psychiatry, multiple medications.  Reports memory impairment.    BP 136/78 (BP Location: Right Arm, Patient Position: Sitting, Cuff Size: Normal)   Pulse 76   Temp 98.3 F (36.8 C) (Temporal)   Resp 16   Ht 5' 3"  (1.6 m)   Wt 148 lb 1.3 oz (67.2 kg)   SpO2 95%   BMI 26.23 kg/m   Physical Exam  Constitutional: She is oriented to person, place, and time. She appears well-developed and well-nourished.  Moves without discomfort.  Normal gait  HENT:  Head: Normocephalic and atraumatic.  Mouth/Throat: Oropharynx is clear and moist.  Eyes: Conjunctivae are normal. Pupils are equal, round, and reactive to light.  Neck: Normal range of motion. Neck supple. No thyromegaly present.  Cardiovascular: Normal rate, regular rhythm and normal heart sounds.  Pulmonary/Chest: Effort normal and breath sounds normal. No respiratory distress.  Musculoskeletal: Normal range of motion. She exhibits no edema.  Lymphadenopathy:    She has no cervical adenopathy.  Neurological: She is alert and oriented to person, place, and time.  Skin: Skin is warm and dry.  Psychiatric: Her behavior is normal. Thought content normal.  Depressed mood and affect.  Hesitant speech  Nursing note and vitals reviewed.   ASSESSMENT/PLAN:  1. Essential hypertension Blood pressure well controlled - CBC - COMPLETE METABOLIC PANEL WITH GFR - Urinalysis, Routine w reflex microscopic  2. Hyperlipidemia, unspecified hyperlipidemia type On statin - Lipid panel  3. DDD (degenerative disc  disease), lumbar Chronic low back pain.  Conservative management discussed.  4. Screening for colon cancer Colonoscopy due.  Last colonoscopy was normal, 2007. - Cologuard   Patient Instructions  No change in medicine See me in six months Need lab tests for next visit Cologuard ordered  Call sooner for problems   Raylene Everts, MD

## 2017-06-14 ENCOUNTER — Ambulatory Visit (HOSPITAL_COMMUNITY)
Admission: RE | Admit: 2017-06-14 | Discharge: 2017-06-14 | Disposition: A | Discharge status: Home / Self Care | Payer: 59 | Source: Ambulatory Visit | Attending: Orthopaedic Surgery | Admitting: Orthopaedic Surgery

## 2017-06-14 DIAGNOSIS — G8929 Other chronic pain: Secondary | ICD-10-CM | POA: Insufficient documentation

## 2017-06-14 DIAGNOSIS — M25562 Pain in left knee: Secondary | ICD-10-CM | POA: Diagnosis present

## 2017-06-15 ENCOUNTER — Ambulatory Visit: Payer: Self-pay | Admitting: Orthopaedic Surgery

## 2017-06-20 ENCOUNTER — Ambulatory Visit: Payer: 59 | Admitting: Orthopedic Surgery

## 2017-06-20 ENCOUNTER — Encounter: Payer: Self-pay | Admitting: Orthopedic Surgery

## 2017-06-20 VITALS — BP 125/76 | HR 86 | Ht 63.0 in | Wt 150.0 lb

## 2017-06-20 DIAGNOSIS — M23321 Other meniscus derangements, posterior horn of medial meniscus, right knee: Secondary | ICD-10-CM

## 2017-06-20 DIAGNOSIS — M1712 Unilateral primary osteoarthritis, left knee: Secondary | ICD-10-CM | POA: Diagnosis not present

## 2017-06-20 NOTE — Patient Instructions (Signed)
Meniscus Injury, Arthroscopy Arthroscopy is a surgical procedure that involves the use of a small scope that has a camera and surgical instruments on the end (arthroscope). An arthroscope can be used to repair your meniscus injury.  LET [facility] CARE PROVIDER KNOW ABOUT:  Any allergies you have.  All medicines you are taking, including vitamins, herbs, eyedrops, creams, and over-the-counter medicines.  Any recent colds or infections you have had or currently have.  Previous problems you or members of your family have had with the use of anesthetics.  Any blood disorders or blood clotting problems you have.  Previous surgeries you have had.  Medical conditions you have. RISKS AND COMPLICATIONS Generally, this is a safe procedure. However, as with any procedure, problems can occur. Possible problems include:  Damage to nerves or blood vessels.  Excess bleeding.  Blood clots.  Infection. BEFORE THE PROCEDURE  Do not eat or drink for 6-8 hours before the procedure.  Take medicines as directed by your surgeon. Ask your surgeon about changing or stopping your regular medicines.  You may have lab tests the morning of surgery. PROCEDURE  You will be given one of the following:   A medicine that numbs the area (local anesthesia).  A medicine that makes you go to sleep (general anesthesia).  A medicine injected into your spine that numbs your body below the waist (spinal anesthesia). Most often, several small cuts (incisions) are made in the knee. The arthroscope and instruments go into the incisions to repair the damage. The torn portion of the meniscus is removed.   AFTER THE PROCEDURE  You will be taken to the recovery area where your progress will be monitored. When you are awake, stable, and taking fluids without complications, you will be allowed to go home. This is usually the same day. A torn or stretched ligament (ligament sprain) may take 6-8 weeks to heal.   It  takes about the 4-6 WEEKS if your surgeon removed a torn meniscus.  A repaired meniscus may require 6-12 weeks of recovery time.  A torn ligament needing reconstructive surgery may take 6-12 months to heal fully.   This information is not intended to replace advice given to you by your health care provider. Make sure you discuss any questions you have with your health care provider. You have decided to proceed with operative arthroscopy of the knee. You have decided not to continue with nonoperative measures such as but not limited to oral medication, weight loss, activity modification, physical therapy, bracing, or injection.  We will perform operative arthroscopy of the knee. Some of the risks associated with arthroscopic surgery of the knee include but are not limited to Bleeding Infection Swelling Stiffness Blood clot Pain  If you're not comfortable with these risks and would like to continue with nonoperative treatment please let Dr. Aline Brochure know prior to your surgery.   Document Released: 06/17/2000 Document Revised: 06/25/2013 Document Reviewed: 11/16/2012 Elsevier Interactive Patient Education 2016 [place] have decided to proceed with operative arthroscopy of the knee. You have decided not to continue with nonoperative measures such as but not limited to oral medication, weight loss, activity modification, physical therapy, bracing, or injection.  We will perform operative arthroscopy of the knee. Some of the risks associated with arthroscopic surgery of the knee include but are not limited to Bleeding Infection Swelling Stiffness Blood clot Pain  If you're not comfortable with these risks and would like to continue with nonoperative treatment please let Dr. Aline Brochure  know prior to your surgery.

## 2017-06-20 NOTE — Progress Notes (Signed)
Progress Note   Patient ID: [name], female   DOB: [birth year], 56 y.o.   MRN: [id number]  Chief Complaint  Patient presents with  . Knee Pain    bilateral     56 year old female referred to me by Dr. Luna Glasgow for torn right medial meniscus considering surgery after failure of nonoperative treatment.  She complains of medial joint line pain.  She denies trauma or pain came on insidiously.  She does note a catching and locking sensation in the right knee occasional swelling.  She did undergo physical therapy without any success she also took Tylenol for pain without success       Review of Systems  Constitutional: Negative for fever.  Respiratory: Negative.   Cardiovascular: Negative.   Neurological: Negative.    Current Meds  Medication Sig  . acetaminophen (TYLENOL) 500 MG tablet Take 1,000 mg by mouth every 6 (six) hours as needed for moderate pain.   Marland Kitchen ALPRAZolam (XANAX) 1 MG tablet Take 1 tablet (1 mg total) 3 (three) times daily by mouth.  . ARIPiprazole (ABILIFY) 2 MG tablet Take 1 tablet (2 mg total) daily by mouth.  . Ascorbic Acid (VITAMIN C) 1000 MG tablet Take 1,000 mg by mouth daily.  . B Complex-C (SUPER B COMPLEX PO) Take 1 tablet by mouth daily.   . cetirizine (ZYRTEC) 10 MG tablet Take 10 mg by mouth daily.  . cycloSPORINE (RESTASIS) 0.05 % ophthalmic emulsion Place 1 drop into both eyes 2 (two) times daily as needed.  Marland Kitchen FLUoxetine (PROZAC) 20 MG capsule Take 1 capsule (20 mg total) daily by mouth.  Marland Kitchen lisinopril (PRINIVIL,ZESTRIL) 20 MG tablet Take 20 mg by mouth daily.   . Magnesium 250 MG TABS Take 250 mg by mouth daily.   . methylphenidate (RITALIN) 10 MG tablet Take 1 tablet (10 mg total) 2 (two) times daily with breakfast and lunch by mouth.  . Multiple Vitamin (MULITIVITAMIN WITH MINERALS) TABS Take 1 tablet by mouth daily.  . pantoprazole (PROTONIX) 40 MG tablet Take 40 mg by mouth 2 (two) times daily.  . polyethylene glycol powder (GLYCOLAX/MIRALAX)  powder Take 17 g by mouth daily.  . Potassium 99 MG TABS Take 1 tablet by mouth daily.  . simvastatin (ZOCOR) 20 MG tablet TAKE ONE TABLET BY MOUTH ONCE DAILY AT BEDTIME  . traZODone (DESYREL) 100 MG tablet Take 1 tablet (100 mg total) at bedtime by mouth.    Past Medical History:  Diagnosis Date  . Allergy    grass, dust , mold  . Anxiety   . Arthritis   . Bipolar disorder ([place])   . Cancer ([place])   . Carpal tunnel syndrome    Bilateral  . Chest pain 09/2011   Cardiac cath-normal coronaries  . Constipation   . Depression   . Difficulty urinating 05/31/2013  . Elevated LFTs 12/16/2013  . GERD (gastroesophageal reflux disease)   . History of kidney stones   . Hyperlipemia   . Hyperlipidemia   . Hypertension    Mild; provoked by stress and anxiety  . IBS (irritable bowel syndrome)   . Intracerebral bleed ([place])    No aneurysm; followed by Dr. Sherwood Gambler  . Loss of weight 01/06/2015  . Stroke [facility]) 1999   hemorrhagic stroke; weakness of left side     Allergies  Allergen Reactions  . Morphine And Related Hives  . Promethazine Hcl     Causes patient to become Hyper    BP 125/76   Pulse  86   Ht 5' 3"  (1.6 m)   Wt 150 lb (68 kg)   BMI 26.57 kg/m    Physical Exam  Constitutional: She is oriented to person, place, and time. She appears well-developed and well-nourished.  Musculoskeletal:  Normal gait pattern  Neurological: She is alert and oriented to person, place, and time.  Psychiatric: She has a normal mood and affect. Judgment normal.  Vitals reviewed.   Right Knee Exam   Muscle Strength  The patient has normal right knee strength.  Tenderness  The patient is experiencing tenderness in the medial joint line.  Range of Motion  Extension: normal  Flexion: normal   Tests  McMurray:  Medial - positive Lateral - negative Varus: negative Valgus: negative Drawer:  Anterior - negative    Posterior - negative  Other  Erythema: absent Scars: absent Sensation:  normal Pulse: present Swelling: none   Left Knee Exam   Muscle Strength  The patient has normal left knee strength.  Tenderness  The patient is experiencing tenderness in the medial joint line.  Range of Motion  Extension: normal  Flexion: normal   Tests  McMurray:  Medial - negative Lateral - negative Varus: negative Valgus: negative Drawer:  Anterior - negative     Posterior - negative  Other  Erythema: absent Scars: absent Sensation: normal Pulse: present Swelling: none       Medical decision-making  Imaging: MRI report of the right knee reveals a torn medial meniscus without any arthritis there is a blister of the patellar cartilage  The left knee does not show arthritis but does show cartilage thickness loss medial femoral condyle  Patient is opted for right knee arthroscopy partial medial meniscectomy with a 4-6-week recovery.  In an attempt at home therapy after surgery due to her high co-pay of $90 per visit  As for the left knee she will try nonoperative treatment with anti-inflammatory medications weight loss and exercise  Encounter Diagnoses  Name Primary?  . Primary osteoarthritis of left knee Yes  . Derangement of posterior horn of medial meniscus of right knee       Arthroscopy right knee partial medial meniscectomy   Arther Abbott, MD 06/20/2017 2:34 PM

## 2017-06-21 NOTE — Addendum Note (Signed)
Addended by: Carole Civil on: 06/21/2017 08:31 AM   Modules accepted: Orders, SmartSet

## 2017-06-29 ENCOUNTER — Telehealth: Payer: Self-pay | Admitting: Radiology

## 2017-06-29 NOTE — Telephone Encounter (Signed)
[name] called back to have me fax clinicals to her [phone] I have sent them the Ref number for surgical approval is [id number]

## 2017-06-29 NOTE — Telephone Encounter (Signed)
[name] left message for me to call her back regarding precert for upcoming surgery. I called [phone] and left message for her to call me back

## 2017-06-30 NOTE — Telephone Encounter (Signed)
Langley Gauss called states faxed clinicals not received, I advised her I faxed yesterday, she states they are very busy with holidays, and it may not have gotten to the proper place, has asked me to refax to her [phone] have refaxed to her, and put the case number on fax [id number]

## 2017-07-03 NOTE — Telephone Encounter (Signed)
Have received surgical approval via fax

## 2017-07-05 NOTE — Patient Instructions (Signed)
[name]  07/05/2017     @PREFPERIOPPHARMACY @   Your procedure is scheduled on 07/13/2017.  Report to [name] at 6:15 A.M.  Call this number if you have problems the morning of surgery:  [phone]   Remember:  Do not eat food or drink liquids after midnight.  Take these medicines the morning of surgery with A SIP OF WATER Xanax, Abilify, Zyrtec, Restasis, Prozac, Lisinopril, Magnesium, Ritalin, Protonix   Do not wear jewelry, make-up or nail polish.  Do not wear lotions, powders, or perfumes, or deodorant.  Do not shave 48 hours prior to surgery.  Men may shave face and neck.  Do not bring valuables to the hospital.  [facility] is not responsible for any belongings or valuables.  Contacts, dentures or bridgework may not be worn into surgery.  Leave your suitcase in the car.  After surgery it may be brought to your room.  For patients admitted to the hospital, discharge time will be determined by your treatment team.  Patients discharged the day of surgery will not be allowed to drive home.   Please read over the following fact sheets that you were given. Surgical Site Infection Prevention and Anesthesia Post-op Instructions     PATIENT INSTRUCTIONS POST-ANESTHESIA  IMMEDIATELY FOLLOWING SURGERY:  Do not drive or operate machinery for the first twenty four hours after surgery.  Do not make any important decisions for twenty four hours after surgery or while taking narcotic pain medications or sedatives.  If you develop intractable nausea and vomiting or a severe headache please notify your doctor immediately.  FOLLOW-UP:  Please make an appointment with your surgeon as instructed. You do not need to follow up with anesthesia unless specifically instructed to do so.  WOUND CARE INSTRUCTIONS (if applicable):  Keep a dry clean dressing on the anesthesia/puncture wound site if there is drainage.  Once the wound has quit draining you may leave it open to air.   Generally you should leave the bandage intact for twenty four hours unless there is drainage.  If the epidural site drains for more than 36-48 hours please call the anesthesia department.  QUESTIONS?:  Please feel free to call your physician or the hospital operator if you have any questions, and they will be happy to assist you.      Knee Arthroscopy Knee arthroscopy is a surgical procedure that is used to examine the inside of your knee joint and repair any damage. The surgeon puts a small, lighted instrument with a camera on the tip (arthroscope) through a small incision in your knee. The camera sends pictures to a monitor in the operating room. Your surgeon uses those pictures to guide the surgical instruments through other incisions to the area of damage. Knee arthroscopy can be used to treat many types of knee problems. It may be used:  To repair a torn ligament.  To repair or remove damaged tissue.  To remove a fluid-filled sac (cyst) from your knee.  Tell a health care provider about:  Any allergies you have.  All medicines you are taking, including vitamins, herbs, eye drops, creams, and over-the-counter medicines.  Any problems you or family members have had with anesthetic medicines.  Any blood disorders you have.  Any surgeries you have had.  Any medical conditions you have. What are the risks? Generally, this is a safe procedure. However, problems may occur, including:  Infection.  Bleeding.  Damage to blood vessels, nerves, or structures of your  knee.  A blood clot that forms in your leg and travels to your lung.  Failure to relieve symptoms.  What happens before the procedure?  Ask your health care provider about: ? Changing or stopping your regular medicines. This is especially important if you are taking diabetes medicines or blood thinners. ? Taking medicines such as aspirin and ibuprofen. These medicines can thin your blood. Do not take these medicines  before your procedure if your health care provider instructs you not to.  Follow your health care provider's instructions about eating or drinking restrictions.  Plan to have someone take you home after the procedure.  If you go home right after the procedure, plan to have someone with you for 24 hours.  Do not drink alcohol unless your health care provider says that you can.  Do not use any tobacco products, including cigarettes, chewing tobacco, or electronic cigarettes unless your health care provider says that you can. If you need help quitting, ask your health care provider.  You may have a physical exam. What happens during the procedure?  An IV tube will be inserted into one of your veins.  You will be given one or more of the following: ? A medicine that helps you relax (sedative). ? A medicine that numbs the area (local anesthetic). ? A medicine that makes you fall asleep (general anesthetic). ? A medicine that is injected into your spine that numbs the area below and slightly above the injection site (spinal anesthetic). ? A medicine that is injected into an area of your body that numbs everything below the injection site (regional anesthetic).  A cuff may be placed around your upper leg to slow bleeding during the procedure.  The surgeon will make a small number of incisions around your knee.  Your knee joint will be flushed and filled with a germ-free (sterile) solution.  The arthroscope will be passed through an incision into your knee joint.  More instruments will be passed through other incisions to repair your knee as needed.  The fluid will be removed from your knee.  The incisions will be closed with adhesive strips or stitches (sutures).  A bandage (dressing) will be placed over your knee. The procedure may vary among health care providers and hospitals. What happens after the procedure?  Your blood pressure, heart rate, breathing rate and blood oxygen  level will be monitored often until the medicines you were given have worn off.  You may be given medicine for pain.  You may get crutches to help you walk without using your knee to support your body weight.  You may have to wear compression stockings. These stocking help to prevent blood clots and reduce swelling in your legs. This information is not intended to replace advice given to you by your health care provider. Make sure you discuss any questions you have with your health care provider. Document Released: 06/17/2000 Document Revised: 11/26/2015 Document Reviewed: 06/16/2014 Elsevier Interactive Patient Education  [employer].

## 2017-07-10 ENCOUNTER — Encounter (HOSPITAL_COMMUNITY)
Admission: RE | Admit: 2017-07-10 | Discharge: 2017-07-10 | Disposition: A | Discharge status: Home / Self Care | Payer: 59 | Source: Ambulatory Visit | Attending: Orthopedic Surgery | Admitting: Orthopedic Surgery

## 2017-07-10 ENCOUNTER — Encounter (HOSPITAL_COMMUNITY): Payer: Self-pay

## 2017-07-10 ENCOUNTER — Other Ambulatory Visit: Payer: Self-pay

## 2017-07-10 DIAGNOSIS — Z87442 Personal history of urinary calculi: Secondary | ICD-10-CM | POA: Diagnosis not present

## 2017-07-10 DIAGNOSIS — Z8673 Personal history of transient ischemic attack (TIA), and cerebral infarction without residual deficits: Secondary | ICD-10-CM | POA: Diagnosis not present

## 2017-07-10 DIAGNOSIS — F419 Anxiety disorder, unspecified: Secondary | ICD-10-CM | POA: Diagnosis not present

## 2017-07-10 DIAGNOSIS — K219 Gastro-esophageal reflux disease without esophagitis: Secondary | ICD-10-CM | POA: Diagnosis not present

## 2017-07-10 DIAGNOSIS — E785 Hyperlipidemia, unspecified: Secondary | ICD-10-CM | POA: Diagnosis not present

## 2017-07-10 DIAGNOSIS — Z79899 Other long term (current) drug therapy: Secondary | ICD-10-CM | POA: Diagnosis not present

## 2017-07-10 DIAGNOSIS — S83241A Other tear of medial meniscus, current injury, right knee, initial encounter: Secondary | ICD-10-CM | POA: Diagnosis not present

## 2017-07-10 DIAGNOSIS — F319 Bipolar disorder, unspecified: Secondary | ICD-10-CM | POA: Diagnosis not present

## 2017-07-10 DIAGNOSIS — I10 Essential (primary) hypertension: Secondary | ICD-10-CM | POA: Diagnosis not present

## 2017-07-10 DIAGNOSIS — X58XXXA Exposure to other specified factors, initial encounter: Secondary | ICD-10-CM | POA: Diagnosis not present

## 2017-07-10 DIAGNOSIS — Y929 Unspecified place or not applicable: Secondary | ICD-10-CM | POA: Diagnosis not present

## 2017-07-10 DIAGNOSIS — K589 Irritable bowel syndrome without diarrhea: Secondary | ICD-10-CM | POA: Diagnosis not present

## 2017-07-10 HISTORY — DX: Age-related osteoporosis without current pathological fracture: M81.0

## 2017-07-10 LAB — CBC WITH DIFFERENTIAL/PLATELET
Basophils Absolute: 0 10*3/uL (ref 0.0–0.1)
Basophils Relative: 0 %
Eosinophils Absolute: 0.1 10*3/uL (ref 0.0–0.7)
Eosinophils Relative: 1 %
HCT: 39.9 % (ref 36.0–46.0)
Hemoglobin: 13.3 g/dL (ref 12.0–15.0)
Lymphocytes Relative: 24 %
Lymphs Abs: 1.8 10*3/uL (ref 0.7–4.0)
MCH: 31.9 pg (ref 26.0–34.0)
MCHC: 33.3 g/dL (ref 30.0–36.0)
MCV: 95.7 fL (ref 78.0–100.0)
Monocytes Absolute: 0.6 10*3/uL (ref 0.1–1.0)
Monocytes Relative: 8 %
Neutro Abs: 5 10*3/uL (ref 1.7–7.7)
Neutrophils Relative %: 67 %
Platelets: 304 10*3/uL (ref 150–400)
RBC: 4.17 MIL/uL (ref 3.87–5.11)
RDW: 12.3 % (ref 11.5–15.5)
WBC: 7.5 10*3/uL (ref 4.0–10.5)

## 2017-07-10 LAB — SURGICAL PCR SCREEN
MRSA, PCR: NEGATIVE
Staphylococcus aureus: POSITIVE — AB

## 2017-07-10 LAB — BASIC METABOLIC PANEL
Anion gap: 11 (ref 5–15)
BUN: 14 mg/dL (ref 6–20)
CO2: 26 mmol/L (ref 22–32)
Calcium: 9.3 mg/dL (ref 8.9–10.3)
Chloride: 101 mmol/L (ref 101–111)
Creatinine, Ser: 0.88 mg/dL (ref 0.44–1.00)
GFR calc Af Amer: >60 mL/min (ref >60)
GFR calc non Af Amer: >60 mL/min (ref >60)
Glucose, Bld: 105 mg/dL — ABNORMAL HIGH (ref 65–99)
Potassium: 3.8 mmol/L (ref 3.5–5.1)
Sodium: 138 mmol/L (ref 135–145)

## 2017-07-11 MED ORDER — MUPIROCIN 2 % EX OINT
TOPICAL_OINTMENT | CUTANEOUS | Status: AC
  Filled 2017-07-11: qty 22

## 2017-07-12 NOTE — H&P (Signed)
Progress Note    Patient ID: [name], female   DOB: 30-Jun-1961, 57 y.o.   MRN: [id number]       Chief Complaint  Patient presents with  . Knee Pain      bilateral       57 year old female referred to me by Dr. Luna Glasgow for torn right medial meniscus considering surgery after failure of nonoperative treatment.  She complains of medial joint line pain.  She denies trauma or pain came on insidiously.  She does note a catching and locking sensation in the right knee occasional swelling.  She did undergo physical therapy without any success she also took Tylenol for pain without success             Review of Systems  Constitutional: Negative for fever.  Respiratory: Negative.   Cardiovascular: Negative.   Neurological: Negative.     Active Medications      Current Meds  Medication Sig  . acetaminophen (TYLENOL) 500 MG tablet Take 1,000 mg by mouth every 6 (six) hours as needed for moderate pain.   Marland Kitchen ALPRAZolam (XANAX) 1 MG tablet Take 1 tablet (1 mg total) 3 (three) times daily by mouth.  . ARIPiprazole (ABILIFY) 2 MG tablet Take 1 tablet (2 mg total) daily by mouth.  . Ascorbic Acid (VITAMIN C) 1000 MG tablet Take 1,000 mg by mouth daily.  . B Complex-C (SUPER B COMPLEX PO) Take 1 tablet by mouth daily.   . cetirizine (ZYRTEC) 10 MG tablet Take 10 mg by mouth daily.  . cycloSPORINE (RESTASIS) 0.05 % ophthalmic emulsion Place 1 drop into both eyes 2 (two) times daily as needed.  Marland Kitchen FLUoxetine (PROZAC) 20 MG capsule Take 1 capsule (20 mg total) daily by mouth.  Marland Kitchen lisinopril (PRINIVIL,ZESTRIL) 20 MG tablet Take 20 mg by mouth daily.   . Magnesium 250 MG TABS Take 250 mg by mouth daily.   . methylphenidate (RITALIN) 10 MG tablet Take 1 tablet (10 mg total) 2 (two) times daily with breakfast and lunch by mouth.  . Multiple Vitamin (MULITIVITAMIN WITH MINERALS) TABS Take 1 tablet by mouth daily.  . pantoprazole (PROTONIX) 40 MG tablet Take 40 mg by mouth 2 (two) times daily.  .  polyethylene glycol powder (GLYCOLAX/MIRALAX) powder Take 17 g by mouth daily.  . Potassium 99 MG TABS Take 1 tablet by mouth daily.  . simvastatin (ZOCOR) 20 MG tablet TAKE ONE TABLET BY MOUTH ONCE DAILY AT BEDTIME  . traZODone (DESYREL) 100 MG tablet Take 1 tablet (100 mg total) at bedtime by mouth.            Past Medical History:  Diagnosis Date  . Allergy      grass, dust , mold  . Anxiety    . Arthritis    . Bipolar disorder ([place])    . Cancer ([place])    . Carpal tunnel syndrome      Bilateral  . Chest pain 09/2011    Cardiac cath-normal coronaries  . Constipation    . Depression    . Difficulty urinating 05/31/2013  . Elevated LFTs 12/16/2013  . GERD (gastroesophageal reflux disease)    . History of kidney stones    . Hyperlipemia    . Hyperlipidemia    . Hypertension      Mild; provoked by stress and anxiety  . IBS (irritable bowel syndrome)    . Intracerebral bleed ([place])      No aneurysm; followed by Dr. Sherwood Gambler  .  Loss of weight 01/06/2015  . Stroke [facility]) 1999    hemorrhagic stroke; weakness of left side             Allergies  Allergen Reactions  . Morphine And Related Hives  . Promethazine Hcl        Causes patient to become Hyper      BP 125/76   Pulse 86   Ht 5' 3"  (1.6 m)   Wt 150 lb (68 kg)   BMI 26.57 kg/m      Physical Exam  Constitutional: She is oriented to person, place, and time. She appears well-developed and well-nourished.  Musculoskeletal:  Normal gait pattern  Neurological: She is alert and oriented to person, place, and time.  Psychiatric: She has a normal mood and affect. Judgment normal.  Vitals reviewed.     Right Knee Exam    Muscle Strength  The patient has normal right knee strength.   Tenderness  The patient is experiencing tenderness in the medial joint line.   Range of Motion  Extension: normal  Flexion: normal    Tests  McMurray:  Medial - positive Lateral - negative Varus: negative Valgus: negative Drawer:   Anterior - negative    Posterior - negative   Other  Erythema: absent Scars: absent Sensation: normal Pulse: present Swelling: none     Left Knee Exam    Muscle Strength  The patient has normal left knee strength.   Tenderness  The patient is experiencing tenderness in the medial joint line.   Range of Motion  Extension: normal  Flexion: normal    Tests  McMurray:  Medial - negative Lateral - negative Varus: negative Valgus: negative Drawer:  Anterior - negative     Posterior - negative   Other  Erythema: absent Scars: absent Sensation: normal Pulse: present Swelling: none             Medical decision-making   Imaging: MRI report of the right knee reveals a torn medial meniscus without any arthritis there is a blister of the patellar cartilage   The left knee does not show arthritis but does show cartilage thickness loss medial femoral condyle   Patient is opted for right knee arthroscopy partial medial meniscectomy with a 4-6-week recovery.  In an attempt at home therapy after surgery due to her high co-pay of $90 per visit   As for the left knee she will try nonoperative treatment with anti-inflammatory medications weight loss and exercise       Encounter Diagnoses  Name Primary?  . Primary osteoarthritis of left knee Yes  . Derangement of posterior horn of medial meniscus of right knee            Arthroscopy right knee partial medial meniscectomy     Arther Abbott, MD 06/20/2017 2:34 PM

## 2017-07-13 ENCOUNTER — Ambulatory Visit (HOSPITAL_COMMUNITY)
Admission: RE | Admit: 2017-07-13 | Discharge: 2017-07-13 | Disposition: A | Discharge status: Home / Self Care | Payer: 59 | Source: Ambulatory Visit | Attending: Orthopedic Surgery | Admitting: Orthopedic Surgery

## 2017-07-13 ENCOUNTER — Ambulatory Visit (HOSPITAL_COMMUNITY): Payer: 59 | Admitting: Anesthesiology

## 2017-07-13 ENCOUNTER — Encounter (HOSPITAL_COMMUNITY): Payer: Self-pay | Admitting: Anesthesiology

## 2017-07-13 ENCOUNTER — Encounter (HOSPITAL_COMMUNITY)
Admission: RE | Disposition: A | Discharge status: Home / Self Care | Payer: Self-pay | Source: Ambulatory Visit | Attending: Orthopedic Surgery

## 2017-07-13 DIAGNOSIS — X58XXXA Exposure to other specified factors, initial encounter: Secondary | ICD-10-CM | POA: Insufficient documentation

## 2017-07-13 DIAGNOSIS — K589 Irritable bowel syndrome without diarrhea: Secondary | ICD-10-CM | POA: Insufficient documentation

## 2017-07-13 DIAGNOSIS — Z87442 Personal history of urinary calculi: Secondary | ICD-10-CM | POA: Insufficient documentation

## 2017-07-13 DIAGNOSIS — E785 Hyperlipidemia, unspecified: Secondary | ICD-10-CM | POA: Insufficient documentation

## 2017-07-13 DIAGNOSIS — M2241 Chondromalacia patellae, right knee: Secondary | ICD-10-CM

## 2017-07-13 DIAGNOSIS — K219 Gastro-esophageal reflux disease without esophagitis: Secondary | ICD-10-CM | POA: Insufficient documentation

## 2017-07-13 DIAGNOSIS — S83241A Other tear of medial meniscus, current injury, right knee, initial encounter: Secondary | ICD-10-CM | POA: Insufficient documentation

## 2017-07-13 DIAGNOSIS — F319 Bipolar disorder, unspecified: Secondary | ICD-10-CM | POA: Insufficient documentation

## 2017-07-13 DIAGNOSIS — Z8673 Personal history of transient ischemic attack (TIA), and cerebral infarction without residual deficits: Secondary | ICD-10-CM | POA: Insufficient documentation

## 2017-07-13 DIAGNOSIS — F419 Anxiety disorder, unspecified: Secondary | ICD-10-CM | POA: Insufficient documentation

## 2017-07-13 DIAGNOSIS — Z79899 Other long term (current) drug therapy: Secondary | ICD-10-CM | POA: Insufficient documentation

## 2017-07-13 DIAGNOSIS — I10 Essential (primary) hypertension: Secondary | ICD-10-CM | POA: Insufficient documentation

## 2017-07-13 DIAGNOSIS — M23321 Other meniscus derangements, posterior horn of medial meniscus, right knee: Secondary | ICD-10-CM

## 2017-07-13 DIAGNOSIS — Y929 Unspecified place or not applicable: Secondary | ICD-10-CM | POA: Insufficient documentation

## 2017-07-13 HISTORY — PX: KNEE ARTHROSCOPY WITH MEDIAL MENISECTOMY: SHX5651

## 2017-07-13 HISTORY — PX: CHONDROPLASTY: SHX5177

## 2017-07-13 SURGERY — ARTHROSCOPY, KNEE, WITH MEDIAL MENISCECTOMY
Anesthesia: General | Site: Knee | Laterality: Right

## 2017-07-13 MED ORDER — FENTANYL CITRATE (PF) 250 MCG/5ML IJ SOLN
INTRAMUSCULAR | Status: AC
  Filled 2017-07-13: qty 5

## 2017-07-13 MED ORDER — IBUPROFEN 800 MG PO TABS: 800.0000 mg | ORAL_TABLET | Freq: Three times a day (TID) | ORAL | 1 refills | Status: DC | PRN

## 2017-07-13 MED ORDER — PROPOFOL 10 MG/ML IV BOLUS
INTRAVENOUS | Status: DC | PRN
  Administered 2017-07-13: 160 mg via INTRAVENOUS

## 2017-07-13 MED ORDER — PROPOFOL 10 MG/ML IV BOLUS
INTRAVENOUS | Status: AC
  Filled 2017-07-13: qty 20

## 2017-07-13 MED ORDER — EPHEDRINE SULFATE 50 MG/ML IJ SOLN
INTRAMUSCULAR | Status: DC | PRN
  Administered 2017-07-13: 10 mg via INTRAVENOUS
  Administered 2017-07-13: 5 mg via INTRAVENOUS

## 2017-07-13 MED ORDER — SODIUM CHLORIDE 0.9 % IR SOLN
Status: DC | PRN
  Administered 2017-07-13: 1000 mL

## 2017-07-13 MED ORDER — MIDAZOLAM HCL 2 MG/2ML IJ SOLN
INTRAMUSCULAR | Status: AC
  Filled 2017-07-13: qty 2

## 2017-07-13 MED ORDER — LACTATED RINGERS IV SOLN
INTRAVENOUS | Status: DC
  Administered 2017-07-13: 1000 mL via INTRAVENOUS

## 2017-07-13 MED ORDER — EPINEPHRINE PF 1 MG/ML IJ SOLN
INTRAMUSCULAR | Status: AC
  Filled 2017-07-13: qty 6

## 2017-07-13 MED ORDER — BUPIVACAINE-EPINEPHRINE (PF) 0.5% -1:200000 IJ SOLN
INTRAMUSCULAR | Status: AC
  Filled 2017-07-13: qty 60

## 2017-07-13 MED ORDER — EPHEDRINE SULFATE 50 MG/ML IJ SOLN
INTRAMUSCULAR | Status: AC
  Filled 2017-07-13: qty 1

## 2017-07-13 MED ORDER — CEFAZOLIN SODIUM-DEXTROSE 2-4 GM/100ML-% IV SOLN
2.0000 g | INTRAVENOUS | Status: AC
  Administered 2017-07-13: 2 g via INTRAVENOUS
  Filled 2017-07-13: qty 100

## 2017-07-13 MED ORDER — POVIDONE-IODINE 10 % EX SWAB: 2.0000 "application " | Freq: Once | CUTANEOUS | Status: DC

## 2017-07-13 MED ORDER — LIDOCAINE HCL (CARDIAC) 10 MG/ML IV SOLN
INTRAVENOUS | Status: DC | PRN
  Administered 2017-07-13: 40 mg via INTRAVENOUS

## 2017-07-13 MED ORDER — FENTANYL CITRATE (PF) 100 MCG/2ML IJ SOLN
INTRAMUSCULAR | Status: AC
  Filled 2017-07-13: qty 2

## 2017-07-13 MED ORDER — GLYCOPYRROLATE 0.2 MG/ML IJ SOLN
INTRAMUSCULAR | Status: AC
  Filled 2017-07-13: qty 1

## 2017-07-13 MED ORDER — SODIUM CHLORIDE 0.9 % IJ SOLN
INTRAMUSCULAR | Status: AC
  Filled 2017-07-13: qty 10

## 2017-07-13 MED ORDER — BUPIVACAINE-EPINEPHRINE (PF) 0.5% -1:200000 IJ SOLN
INTRAMUSCULAR | Status: DC | PRN
  Administered 2017-07-13: 60 mL via PERINEURAL

## 2017-07-13 MED ORDER — MIDAZOLAM HCL 2 MG/2ML IJ SOLN
1.0000 mg | INTRAMUSCULAR | Status: AC
  Administered 2017-07-13: 2 mg via INTRAVENOUS

## 2017-07-13 MED ORDER — HYDROCODONE-ACETAMINOPHEN 5-325 MG PO TABS: 1.0000 | ORAL_TABLET | ORAL | 0 refills | Status: DC | PRN

## 2017-07-13 MED ORDER — LIDOCAINE HCL (PF) 1 % IJ SOLN
INTRAMUSCULAR | Status: AC
  Filled 2017-07-13: qty 5

## 2017-07-13 MED ORDER — CHLORHEXIDINE GLUCONATE 4 % EX LIQD: 60.0000 mL | Freq: Once | CUTANEOUS | Status: DC

## 2017-07-13 MED ORDER — SODIUM CHLORIDE 0.9 % IR SOLN
Status: DC | PRN
  Administered 2017-07-13 (×3): 3000 mL

## 2017-07-13 MED ORDER — ONDANSETRON HCL 4 MG/2ML IJ SOLN
4.0000 mg | Freq: Once | INTRAMUSCULAR | Status: AC
Start: 2017-07-13 — End: 2017-07-13
  Administered 2017-07-13: 4 mg via INTRAVENOUS
  Filled 2017-07-13: qty 2

## 2017-07-13 MED ORDER — FENTANYL CITRATE (PF) 100 MCG/2ML IJ SOLN: 25.0000 ug | INTRAMUSCULAR | Status: DC | PRN

## 2017-07-13 MED ORDER — GLYCOPYRROLATE 0.2 MG/ML IJ SOLN
0.2000 mg | Freq: Once | INTRAMUSCULAR | Status: AC
  Administered 2017-07-13: 0.2 mg via INTRAVENOUS

## 2017-07-13 SURGICAL SUPPLY — 55 items
ARTHROWAND PARAGON T2 (SURGICAL WAND)
BAG HAMPER (MISCELLANEOUS) ×3 IMPLANT
BANDAGE ELASTIC 6 LF NS (GAUZE/BANDAGES/DRESSINGS) ×3 IMPLANT
BLADE AGGRESSIVE PLUS 4.0 (BLADE) ×3 IMPLANT
BLADE SURG SZ11 CARB STEEL (BLADE) ×3 IMPLANT
BNDG CMPR MED 5X6 ELC HKLP NS (GAUZE/BANDAGES/DRESSINGS) ×1
CHLORAPREP W/TINT 26ML (MISCELLANEOUS) ×3 IMPLANT
CLOTH BEACON ORANGE TIMEOUT ST (SAFETY) ×3 IMPLANT
COOLER CRYO IC GRAV AND TUBE (ORTHOPEDIC SUPPLIES) ×3 IMPLANT
CUFF CRYO KNEE18X23 MED (MISCELLANEOUS) ×2 IMPLANT
CUFF TOURNIQUET SINGLE 34IN LL (TOURNIQUET CUFF) ×2 IMPLANT
CUTTER ANGLED DBL BITE 4.5 (BURR) ×2 IMPLANT
DECANTER SPIKE VIAL GLASS SM (MISCELLANEOUS) ×6 IMPLANT
GAUZE SPONGE 4X4 12PLY STRL (GAUZE/BANDAGES/DRESSINGS) ×3 IMPLANT
GAUZE SPONGE 4X4 16PLY XRAY LF (GAUZE/BANDAGES/DRESSINGS) ×3 IMPLANT
GAUZE XEROFORM 5X9 LF (GAUZE/BANDAGES/DRESSINGS) ×3 IMPLANT
GLOVE BIOGEL PI IND STRL 7.0 (GLOVE) ×1 IMPLANT
GLOVE BIOGEL PI INDICATOR 7.0 (GLOVE) ×2
GLOVE SKINSENSE NS SZ8.0 LF (GLOVE) ×2
GLOVE SKINSENSE STRL SZ8.0 LF (GLOVE) ×1 IMPLANT
GLOVE SS N UNI LF 8.5 STRL (GLOVE) ×3 IMPLANT
GOWN STRL REUS W/ TWL LRG LVL3 (GOWN DISPOSABLE) ×1 IMPLANT
GOWN STRL REUS W/TWL LRG LVL3 (GOWN DISPOSABLE) ×3
GOWN STRL REUS W/TWL XL LVL3 (GOWN DISPOSABLE) ×3 IMPLANT
HLDR LEG FOAM (MISCELLANEOUS) ×1 IMPLANT
IV NS IRRIG 3000ML ARTHROMATIC (IV SOLUTION) ×6 IMPLANT
KIT BLADEGUARD II DBL (SET/KITS/TRAYS/PACK) ×3 IMPLANT
KIT ROOM TURNOVER AP CYSTO (KITS) ×3 IMPLANT
LEG HOLDER FOAM (MISCELLANEOUS) ×2
MANIFOLD NEPTUNE II (INSTRUMENTS) ×3 IMPLANT
MARKER SKIN DUAL TIP RULER LAB (MISCELLANEOUS) ×3 IMPLANT
NDL HYPO 18GX1.5 BLUNT FILL (NEEDLE) ×1 IMPLANT
NDL HYPO 21X1.5 SAFETY (NEEDLE) ×1 IMPLANT
NDL SPNL 18GX3.5 QUINCKE PK (NEEDLE) ×1 IMPLANT
NEEDLE HYPO 18GX1.5 BLUNT FILL (NEEDLE) ×3 IMPLANT
NEEDLE HYPO 21X1.5 SAFETY (NEEDLE) ×3 IMPLANT
NEEDLE SPNL 18GX3.5 QUINCKE PK (NEEDLE) ×3 IMPLANT
NS IRRIG 1000ML POUR BTL (IV SOLUTION) ×3 IMPLANT
PACK ARTHRO LIMB DRAPE STRL (MISCELLANEOUS) ×3 IMPLANT
PAD ABD 5X9 TENDERSORB (GAUZE/BANDAGES/DRESSINGS) ×3 IMPLANT
PAD ABD 7.5X8 STRL (GAUZE/BANDAGES/DRESSINGS) ×2 IMPLANT
PAD ARMBOARD 7.5X6 YLW CONV (MISCELLANEOUS) ×3 IMPLANT
PADDING CAST COTTON 6X4 STRL (CAST SUPPLIES) ×3 IMPLANT
PADDING WEBRIL 6 STERILE (GAUZE/BANDAGES/DRESSINGS) ×2 IMPLANT
PROBE BIPOLAR 50 DEGREE SUCT (MISCELLANEOUS) ×2 IMPLANT
PROBE BIPOLAR ATHRO 135MM 90D (MISCELLANEOUS) ×2 IMPLANT
SET ARTHROSCOPY INST (INSTRUMENTS) ×3 IMPLANT
SET ARTHROSCOPY PUMP TUBE (IRRIGATION / IRRIGATOR) ×3 IMPLANT
SET BASIN LINEN APH (SET/KITS/TRAYS/PACK) ×3 IMPLANT
SUT ETHILON 3 0 FSL (SUTURE) ×3 IMPLANT
SYR 30ML LL (SYRINGE) ×3 IMPLANT
SYRINGE 10CC LL (SYRINGE) ×3 IMPLANT
TUBE CONNECTING 12'X1/4 (SUCTIONS) ×3
TUBE CONNECTING 12X1/4 (SUCTIONS) ×6 IMPLANT
WAND ARTHRO PARAGON T2 (SURGICAL WAND) IMPLANT

## 2017-07-13 NOTE — Op Note (Signed)
Knee arthroscopy dictation 07/13/2017 7:30 AM  Preop diagnosis torn medial meniscus right knee  Postop diagnosis torn medial meniscus right knee under malacia of the patella  Procedure arthroscopy right knee partial medial meniscectomy and chondroplasty of the [id number]  Surgeon was [facility]  General anesthesia  Operative findings are listed below  Counts were correct  No complications were noted   The patient was identified in the preoperative holding area using 2 approved identification mechanisms. The chart was reviewed and updated. The surgical site was confirmed as right  knee and marked with an indelible marker.  The patient was taken to the operating room for anesthesia. After successful general LMA anesthesia, 2 g Ancef was used as IV antibiotics.  The patient was placed in the supine position with the (right ) the operative extremity in an arthroscopic leg holder and the opposite extremity in a padded leg holder.  The timeout was executed.  A lateral portal was established with an 11 blade and the scope was introduced into the joint. A diagnostic arthroscopy was performed in circumferential manner examining the entire knee joint. A medial portal was established and the diagnostic arthroscopy was repeated using a probe to palpate intra-articular structures as they were encountered.   The operative findings are   Medial torn medial meniscus posterior horn extensive tear with fishmouth component (horizontal tear) Lateral mild insignificant chondromalacia lateral compartment Patellofemoral large fissure central and median portion of the patella facet Notch normal ACL and PCL   The medial meniscus was resected using a duckbill forceps. The meniscal fragments were removed with a motorized shaver. The meniscus was balanced with a combination of a motorized shaver and a 50 wand until a stable rim was obtained.  A chondroplasty was performed on the patella  The  arthroscopic pump was placed on the wash mode and any excess debris was removed from the joint using suction.  60 cc of Marcaine with epinephrine was injected through the arthroscope.  The portals were closed with 3-0 nylon suture.  A sterile bandage, Ace wrap and Cryo/Cuff was placed and the Cryo/Cuff was activated. The patient was taken to the recovery room in stable condition.

## 2017-07-13 NOTE — Anesthesia Procedure Notes (Signed)
Procedure Name: LMA Insertion Date/Time: 07/13/2017 7:38 AM Performed by: Ollen Bowl, CRNA Pre-anesthesia Checklist: Patient identified, Patient being monitored, Emergency Drugs available, Timeout performed and Suction available Patient Re-evaluated:Patient Re-evaluated prior to induction Oxygen Delivery Method: Circle System Utilized Preoxygenation: Pre-oxygenation with 100% oxygen Induction Type: IV induction Ventilation: Mask ventilation without difficulty LMA: LMA inserted LMA Size: 3.0 Number of attempts: 1 Placement Confirmation: positive ETCO2 and breath sounds checked- equal and bilateral

## 2017-07-13 NOTE — Transfer of Care (Signed)
Immediate Anesthesia Transfer of Care Note  Patient: [name]  Procedure(s) Performed: KNEE ARTHROSCOPY WITH PARTIAL MEDIAL MENISECTOMY (Right Knee) CHONDROPLASTY of patella (Right Knee)  Patient Location: PACU  Anesthesia Type:General  Level of Consciousness: awake and alert   Airway & Oxygen Therapy: Patient Spontanous Breathing and Patient connected to nasal cannula oxygen  Post-op Assessment: Report given to RN  Post vital signs: Reviewed and stable  Last Vitals:  Vitals:   07/13/17 0715 07/13/17 0720  BP:    Pulse:    Resp: (!) 25 19  Temp:    SpO2: 96% 98%    Last Pain:  Vitals:   07/13/17 0708  TempSrc: Oral  PainSc: 5       Patients Stated Pain Goal: 7 (14/84/03 9795)  Complications: No apparent anesthesia complications

## 2017-07-13 NOTE — Brief Op Note (Signed)
07/13/2017  7:29 AM  PATIENT:  [name]  57 y.o. adult  PRE-OPERATIVE DIAGNOSIS:  Torn medial meniscus right knee  POST-OPERATIVE DIAGNOSIS: Torn medial meniscus right knee and chondromalacia patella right knee PROCEDURE:  Procedure(s): KNEE ARTHROSCOPY WITH MEDIAL MENISECTOMY (Right)  Chondroplasty patella  SURGEON:  Surgeon(s) and Role:    Carole Civil, MD - Primary  PHYSICIAN ASSISTANT:   ASSISTANTS: none   ANESTHESIA:   general  EBL:  0  BLOOD ADMINISTERED:none  DRAINS: none   LOCAL MEDICATIONS USED:  MARCAINE     SPECIMEN:  No Specimen  DISPOSITION OF SPECIMEN:  N/A  COUNTS:  YES  TOURNIQUET:  * Missing tourniquet times found for documented tourniquets in log: [id number] *  DICTATION: .Dragon Dictation  PLAN OF CARE: Discharge to home after PACU  PATIENT DISPOSITION:  PACU - hemodynamically stable.   Delay start of Pharmacological VTE agent (>24hrs) due to surgical blood loss or risk of bleeding: not applicable

## 2017-07-13 NOTE — Discharge Instructions (Signed)

## 2017-07-13 NOTE — Anesthesia Postprocedure Evaluation (Signed)
Anesthesia Post Note  Patient: Devika L Azeez  Procedure(s) Performed: KNEE ARTHROSCOPY WITH PARTIAL MEDIAL MENISECTOMY (Right Knee) CHONDROPLASTY of patella (Right Knee)  Patient location during evaluation: PACU Anesthesia Type: General Level of consciousness: awake and alert and oriented Pain management: pain level controlled Vital Signs Assessment: post-procedure vital signs reviewed and stable Respiratory status: spontaneous breathing Cardiovascular status: blood pressure returned to baseline and stable Postop Assessment: no apparent nausea or vomiting Anesthetic complications: no     Last Vitals:  Vitals:   07/13/17 0855 07/13/17 0900  BP:    Pulse: 78 77  Resp: 11 15  Temp:    SpO2: 100% 97%    Last Pain:  Vitals:   07/13/17 0900  TempSrc:   PainSc: 4                  Ilean Spradlin

## 2017-07-13 NOTE — Anesthesia Preprocedure Evaluation (Signed)
Anesthesia Evaluation  Patient identified by MRN, date of birth, ID band Patient awake    Reviewed: Allergy & Precautions, H&P , NPO status , Patient's Chart, lab work & pertinent test results  Airway Mallampati: III  TM Distance: <3 FB Neck ROM: Full  Mouth opening: Limited Mouth Opening  Dental  (+) Teeth Intact   Pulmonary sleep apnea , former smoker,    breath sounds clear to auscultation       Cardiovascular hypertension, Pt. on medications  Rhythm:Regular Rate:Normal     Neuro/Psych PSYCHIATRIC DISORDERS Anxiety Depression Bipolar Disorder  Neuromuscular disease CVA (Left side weakness), Residual Symptoms    GI/Hepatic GERD  Medicated and Controlled,  Endo/Other    Renal/GU      Musculoskeletal   Abdominal   Peds  Hematology   Anesthesia Other Findings   Reproductive/Obstetrics                             Anesthesia Physical Anesthesia Plan  ASA: III  Anesthesia Plan: General   Post-op Pain Management:    Induction: Intravenous  PONV Risk Score and Plan:   Airway Management Planned: LMA  Additional Equipment:   Intra-op Plan:   Post-operative Plan: Extubation in OR  Informed Consent: I have reviewed the patients History and Physical, chart, labs and discussed the procedure including the risks, benefits and alternatives for the proposed anesthesia with the patient or authorized representative who has indicated his/her understanding and acceptance.     Plan Discussed with:   Anesthesia Plan Comments:         Anesthesia Quick Evaluation

## 2017-07-13 NOTE — Interval H&P Note (Signed)
History and Physical Interval Note:  07/13/2017 7:24 AM  [name]  has presented today for surgery, with the diagnosis of Torn medial meniscus right knee  The various methods of treatment have been discussed with the patient and family. After consideration of risks, benefits and other options for treatment, the patient has consented to  Procedure(s): KNEE ARTHROSCOPY WITH MEDIAL MENISECTOMY (Right) as a surgical intervention .  The patient's history has been reviewed, patient examined, no change in status, stable for surgery.  I have reviewed the patient's chart and labs.  Questions were answered to the patient's satisfaction.     Arther Abbott

## 2017-07-14 ENCOUNTER — Encounter (HOSPITAL_COMMUNITY): Payer: Self-pay | Admitting: Orthopedic Surgery

## 2017-07-20 ENCOUNTER — Ambulatory Visit (HOSPITAL_COMMUNITY): Payer: Self-pay | Admitting: Psychiatry

## 2017-07-20 DIAGNOSIS — Z9889 Other specified postprocedural states: Secondary | ICD-10-CM | POA: Insufficient documentation

## 2017-07-21 ENCOUNTER — Ambulatory Visit (INDEPENDENT_AMBULATORY_CARE_PROVIDER_SITE_OTHER): Payer: 59 | Admitting: Orthopedic Surgery

## 2017-07-21 ENCOUNTER — Encounter: Payer: Self-pay | Admitting: Orthopedic Surgery

## 2017-07-21 VITALS — BP 141/87 | HR 71 | Ht 63.0 in | Wt 154.0 lb

## 2017-07-21 DIAGNOSIS — Z9889 Other specified postprocedural states: Secondary | ICD-10-CM

## 2017-07-21 MED ORDER — HYDROCODONE-ACETAMINOPHEN 5-325 MG PO TABS: 1.0000 | ORAL_TABLET | Freq: Four times a day (QID) | ORAL | 0 refills | Status: DC | PRN

## 2017-07-21 NOTE — Progress Notes (Signed)
Postop visit status post arthroscopy right knee on July 13, 2017 complains of lateral knee soreness  BP (!) 141/87   Pulse 71   Ht 5' 3"  (1.6 m)   Wt 154 lb (69.9 kg)   BMI 27.28 kg/m   She has regained full range of motion portals look clean recommend home exercise program follow-up in 3 weeks refill pain medicine one time and then convert to Tylenol or Advil  Encounter Diagnosis  Name Primary?  . S/P right knee arthroscopy 07/13/17 Yes   Meds ordered this encounter  Medications  . HYDROcodone-acetaminophen (NORCO/VICODIN) 5-325 MG tablet    Sig: Take 1 tablet by mouth every 6 (six) hours as needed for moderate pain.    Dispense:  28 tablet    Refill:  0

## 2017-07-25 ENCOUNTER — Ambulatory Visit (HOSPITAL_COMMUNITY): Payer: Self-pay | Admitting: Psychiatry

## 2017-08-08 ENCOUNTER — Encounter (HOSPITAL_COMMUNITY): Payer: Self-pay | Admitting: Psychiatry

## 2017-08-08 ENCOUNTER — Ambulatory Visit (HOSPITAL_COMMUNITY): Payer: 59 | Admitting: Psychiatry

## 2017-08-08 VITALS — BP 112/70 | HR 93 | Ht 63.0 in | Wt 152.0 lb

## 2017-08-08 DIAGNOSIS — Z813 Family history of other psychoactive substance abuse and dependence: Secondary | ICD-10-CM | POA: Diagnosis not present

## 2017-08-08 DIAGNOSIS — F332 Major depressive disorder, recurrent severe without psychotic features: Secondary | ICD-10-CM

## 2017-08-08 DIAGNOSIS — Z818 Family history of other mental and behavioral disorders: Secondary | ICD-10-CM | POA: Diagnosis not present

## 2017-08-08 DIAGNOSIS — Z79899 Other long term (current) drug therapy: Secondary | ICD-10-CM | POA: Diagnosis not present

## 2017-08-08 DIAGNOSIS — Z87891 Personal history of nicotine dependence: Secondary | ICD-10-CM | POA: Diagnosis not present

## 2017-08-08 DIAGNOSIS — I69311 Memory deficit following cerebral infarction: Secondary | ICD-10-CM | POA: Diagnosis not present

## 2017-08-08 MED ORDER — TRAZODONE HCL 100 MG PO TABS: 100.0000 mg | ORAL_TABLET | Freq: Every day | ORAL | 2 refills | Status: DC

## 2017-08-08 MED ORDER — ARIPIPRAZOLE 2 MG PO TABS: 2.0000 mg | ORAL_TABLET | Freq: Every day | ORAL | 2 refills | Status: DC

## 2017-08-08 MED ORDER — FLUOXETINE HCL 20 MG PO CAPS: 20.0000 mg | ORAL_CAPSULE | Freq: Every day | ORAL | 2 refills | Status: DC

## 2017-08-08 MED ORDER — ALPRAZOLAM 1 MG PO TABS: 1.0000 mg | ORAL_TABLET | Freq: Three times a day (TID) | ORAL | 2 refills | Status: DC | PRN

## 2017-08-08 MED ORDER — AMPHETAMINE-DEXTROAMPHETAMINE 10 MG PO TABS: 10.0000 mg | ORAL_TABLET | Freq: Two times a day (BID) | ORAL | 0 refills | Status: DC

## 2017-08-08 NOTE — Progress Notes (Signed)
[facility] MD/PA/NP OP Progress Note  08/08/2017 2:53 PM [name]  MRN:  [id number]  Chief Complaint:  Chief Complaint    Depression; Anxiety; Follow-up     HPI: this patient is a 57 year old married white female who lives with her husband in [place]. She has no children. 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  The patient was referred by her primary physician, Dr. [name], for further assessment and treatment of depression anxiety and focus problems.  The patient states that she did well most of her life until she had a stroke in 89 at age 47. She had a cerebral aneurysm that burst and she had to have a hematoma evacuated from the right frontal lobe. She has had resulting difficulties ever since and still has weakness on the left side of her body and poor fine motor skills in her left hand. She is right handed. She states that she gets older her symptoms worsen.  The patient often feels anxious particular in crowds. She has frequent panic attacks. She takes Xanax 1 mg twice a day but is reluctant to take more. Shortly after she had her stroke she saw psychiatrist in [place] and was placed on the Xanax. Dr. [name] later put her on Effexor and she is now up to 150 mg per day. [name] not sure if it's helping. She has difficulty sleeping but trazodone helps to some degree. She also is significant problems with focus and attention span. Her thoughts ramble. She'll start one thing and go the next. She can't complete tasks. She finds this extremely frustrating. Her mood is labile at times and she'll get angry quickly and for no reason. She denies auditory or visual hallucinations or paranoia. She does not use drugs and very rarely takes a drink. She admits that time she has passive suicidal ideation but would never hurt her self because of her faith  She returns after 2 months.  Since I last saw her she had arthroscopic knee  surgery on the right side.  She is doing well with that but is impatient for it to get better so she can get back to her gardening.  She states lately she has been irritable.  She often forgets her morning medicines but by noon she remembers and takes them all.  She does not see where the Ritalin is doing much for her so I suggested we switch to Adderall.  She is generally sleeping pretty well.  She still very aggravated about not hearing from disability and wondering if she will ever get approved. Visit Diagnosis:    ICD-10-CM   1. Major depressive disorder, recurrent, severe without psychotic features ([place]) F33.2     Past Psychiatric History: Past treatment for depression  Past Medical History:  Past Medical History:  Diagnosis Date  . Allergy    grass, dust , mold  . Anxiety   . Arthritis   . Bipolar disorder ([place])   . Cancer (HCC)    cervical cancer  . Carpal tunnel syndrome    Bilateral  . Chest pain 09/2011   Cardiac cath-normal coronaries  . Constipation   . Depression   . Difficulty urinating 05/31/2013  . Elevated LFTs 12/16/2013  . GERD (gastroesophageal reflux disease)   . History of kidney stones   . Hyperlipemia   . Hyperlipidemia   . Hypertension    Mild; provoked by stress and anxiety  . IBS (  irritable bowel syndrome)   . Intracerebral bleed ([place])    No aneurysm; followed by Dr. Sherwood Gambler  . Loss of weight 01/06/2015  . Osteoporosis   . Stroke [facility] 1999   hemorrhagic stroke; weakness of left side    Past Surgical History:  Procedure Laterality Date  . ABDOMINAL HYSTERECTOMY     "cancer cells"  . [place]   to remove blood clot after stroke   . CARDIAC CATHETERIZATION  2016  . CERVICAL FUSION    . CHOLECYSTECTOMY N/A 10/14/2014   Procedure: LAPAROSCOPIC CHOLECYSTECTOMY WITH INTRAOPERATIVE CHOLANGIOGRAM;  Surgeon: Jackolyn Confer, MD;  Location: [facility];  Service: General;  Laterality: N/A;  . CHONDROPLASTY Right 07/13/2017   Procedure: CHONDROPLASTY  of patella;  Surgeon: Carole Civil, MD;  Location: [facility];  Service: Orthopedics;  Laterality: Right;  . EUS N/A 08/21/2015   Procedure: ESOPHAGEAL ENDOSCOPIC ULTRASOUND (EUS) RADIAL;  Surgeon: Carol Ada, MD;  Location: [facility];  Service: Endoscopy;  Laterality: N/A;  . KNEE ARTHROSCOPY WITH MEDIAL MENISECTOMY Right 07/13/2017   Procedure: KNEE ARTHROSCOPY WITH PARTIAL MEDIAL MENISECTOMY;  Surgeon: Carole Civil, MD;  Location: [facility];  Service: Orthopedics;  Laterality: Right;  . LEFT HEART CATHETERIZATION WITH CORONARY ANGIOGRAM N/A 09/23/2011   Procedure: LEFT HEART CATHETERIZATION WITH CORONARY ANGIOGRAM;  Surgeon: Thayer Headings, MD;  Location: [facility] CATH LAB;  Service: Cardiovascular;  Laterality: N/A;  . LIPOMA EXCISION Left 11/18/2013   Procedure: EXCISION OF SOFT TISSUE MASS-LEFT THIGH;  Surgeon: Jamesetta So, MD;  Location: [facility];  Service: General;  Laterality: Left;  . RECTOCELE REPAIR     x2  . RECTOCELE REPAIR N/A 04/04/2017   Procedure: POSTERIOR REPAIR (RECTOCELE);  Surgeon: Jonnie Kind, MD;  Location: [facility];  Service: Gynecology;  Laterality: N/A;    Family Psychiatric History: See below  Family History:  Family History  Problem Relation Age of Onset  . Cancer Mother        breast   . Hypertension Mother   . Hyperlipidemia Mother   . Depression Mother   . Anxiety disorder Mother   . COPD Mother   . Arthritis Mother        rheumatoid  . Drug abuse Sister   . Coronary artery disease Paternal Grandfather   . Coronary artery disease Paternal Uncle   . Depression Cousin   . Drug abuse Cousin     Social History:  Social History   Socioeconomic History  . Marital status: Married    Spouse name: Sonia Side  . Number of children: 0  . Years of education: HS  . Highest education level: None  Social Needs  . Financial resource strain: Not hard at all  . Food insecurity - worry: Never true  . Food insecurity - inability: Never true  .  Transportation needs - medical: No  . Transportation needs - non-medical: No  Occupational History  . Occupation: unemployed    Comment: pending disability  Tobacco Use  . Smoking status: Former Smoker    Packs/day: 1.00    Years: 19.00    Pack years: 19.00    Types: Cigarettes    Last attempt to quit: 09/01/1997    Years since quitting: 19.9  . Smokeless tobacco: Never Used  . Tobacco comment: Quit smoking 1999 , previous 20 pack years  Substance and Sexual Activity  . Alcohol use: Yes    Comment: 1 drink every other week  . Drug use: No  .  Sexual activity: Not Currently    Partners: Male    Birth control/protection: Surgical  Other Topics Concern  . None  Social History Narrative   Currently unable to work   Lives in [place]   Married   Patient drinks 1 cup of caffeine daily.   Patient is right handed.    [employer]ined the [employer] to get more exercise    Allergies:  Allergies  Allergen Reactions  . Morphine And Related Hives  . Promethazine Hcl Other (See Comments)    Causes patient to become Hyper    Metabolic Disorder Labs: Lab Results  Component Value Date   HGBA1C 5.3 06/02/2017   MPG 117 (H) 01/06/2015   MPG 117 (H) 02/17/2014   No results found for: PROLACTIN Lab Results  Component Value Date   CHOL 206 (H) 06/02/2017   TRIG 141 06/02/2017   HDL 76 06/02/2017   CHOLHDL 2.3 09/16/2015   VLDL 26 09/16/2015   LDLCALC 102 (H) 06/02/2017   LDLCALC 69 09/16/2015   Lab Results  Component Value Date   TSH 1.438 09/20/2011    Therapeutic Level Labs: No results found for: LITHIUM No results found for: VALPROATE No components found for:  CBMZ  Current Medications: Current Outpatient Medications  Medication Sig Dispense Refill  . acetaminophen (TYLENOL) 500 MG tablet Take 1,000 mg by mouth every 6 (six) hours as needed for moderate pain.     Marland Kitchen alendronate (FOSAMAX) 10 MG tablet     . ALPRAZolam (XANAX) 1 MG tablet Take 1 tablet (1 mg total) by mouth 3 (three)  times daily as needed for anxiety. 90 tablet 2  . ARIPiprazole (ABILIFY) 2 MG tablet Take 1 tablet (2 mg total) by mouth daily. 90 tablet 2  . Ascorbic Acid (VITAMIN C) 1000 MG tablet Take 1,000 mg by mouth daily.    . B Complex-C (SUPER B COMPLEX PO) Take 1 tablet by mouth daily.     . cetirizine (ZYRTEC) 10 MG tablet Take 10 mg by mouth daily as needed for allergies.     . cycloSPORINE (RESTASIS) 0.05 % ophthalmic emulsion Place 1 drop into both eyes 2 (two) times daily.     Marland Kitchen FLUoxetine (PROZAC) 20 MG capsule Take 1 capsule (20 mg total) by mouth daily. 90 capsule 2  . HYDROcodone-acetaminophen (NORCO/VICODIN) 5-325 MG tablet Take 1 tablet by mouth every 6 (six) hours as needed for moderate pain. 28 tablet 0  . ibuprofen (ADVIL,MOTRIN) 800 MG tablet Take 1 tablet (800 mg total) by mouth every 8 (eight) hours as needed. 90 tablet 1  . lisinopril (PRINIVIL,ZESTRIL) 20 MG tablet Take 20 mg by mouth daily.     . Magnesium 250 MG TABS Take 250 mg by mouth daily.     . Multiple Vitamin (MULITIVITAMIN WITH MINERALS) TABS Take 1 tablet by mouth daily.    Marland Kitchen nystatin (NYSTATIN) powder Apply 1 g topically daily.    . pantoprazole (PROTONIX) 40 MG tablet Take 40 mg by mouth daily.     . polyethylene glycol powder (GLYCOLAX/MIRALAX) powder Take 17 g by mouth daily. 3350 g 6  . Potassium 99 MG TABS Take 99 mg by mouth daily.     . simvastatin (ZOCOR) 20 MG tablet TAKE ONE TABLET BY MOUTH ONCE DAILY AT BEDTIME (Patient taking differently: TAKE 20 MG BY MOUTH ONCE DAILY AT BEDTIME) 30 tablet 6  . traZODone (DESYREL) 100 MG tablet Take 1 tablet (100 mg total) by mouth at bedtime. 90 tablet 2  .  amphetamine-dextroamphetamine (ADDERALL) 10 MG tablet Take 1 tablet (10 mg total) by mouth 2 (two) times daily with a meal. 60 tablet 0   No current facility-administered medications for this visit.      Musculoskeletal: Strength & Muscle Tone: within normal limits Gait & Station: normal Patient leans:  N/A  Psychiatric Specialty Exam: Review of Systems  Musculoskeletal: Positive for joint pain.  Psychiatric/Behavioral: Positive for memory loss.  All other systems reviewed and are negative.   Blood pressure 112/70, pulse 93, height 5' 3"  (1.6 m), weight 152 lb (68.9 kg), SpO2 95 %.Body mass index is 26.93 kg/m.  General Appearance: Casual and Fairly Groomed  Eye Contact:  Good  Speech:  Clear and Coherent  Volume:  Decreased  Mood:  Irritable  Affect:  Constricted  Thought Process:  Goal Directed  Orientation:  Full (Time, Place, and Person)  Thought Content: Rumination   Suicidal Thoughts:  No  Homicidal Thoughts:  No  Memory:  Immediate;   Good Recent;   Fair Remote;   Poor  Judgement:  Fair  Insight:  Lacking  Psychomotor Activity:  Decreased  Concentration:  Concentration: Fair and Attention Span: Fair  Recall:  [employer] of Knowledge: Good  Language: Good  Akathisia:  No  Handed:  Right  AIMS (if indicated): not done  Assets:  Communication Skills Desire for Improvement Resilience Social Support Talents/Skills  ADL's:  Intact  Cognition: WNL  Sleep:  Good   Screenings: MDI     Office Visit from 01/22/2016 in [facility]  Total Score (max 50)  34    Mini-Mental     Office Visit from 02/13/2015 in [facility]  Total Score (max 30 points )  26    PHQ2-9     Office Visit from 05/09/2017 in [facility] Office Visit from 09/16/2015 in [facility]  PHQ-2 Total Score  0  4  PHQ-9 Total Score  No data  10    SBQ-R     Office Visit from 01/22/2016 in [facility]  SBQ-R Total Score  14.1       Assessment and Plan: She is a 57 year old female with a history of depression anxiety.  She has had difficulty with memory loss since she had a stroke.  She will continue Prozac 20 mg daily for depression, Abilify 2 mg daily for augmentation  and Xanax 1 mg up to 3 times daily for anxiety and trazodone 100 mg at bedtime for sleep.  She will discontinue methylphenidate and start Adderall 10 mg twice a day for focus.  She will return to see me in 4 weeks   [name], MD 08/08/2017, 2:53 PM

## 2017-08-11 ENCOUNTER — Encounter: Payer: Self-pay | Admitting: Orthopedic Surgery

## 2017-08-11 ENCOUNTER — Ambulatory Visit (INDEPENDENT_AMBULATORY_CARE_PROVIDER_SITE_OTHER): Payer: 59 | Admitting: Orthopedic Surgery

## 2017-08-11 VITALS — BP 130/84 | HR 79 | Ht 63.0 in | Wt 150.0 lb

## 2017-08-11 DIAGNOSIS — Z9889 Other specified postprocedural states: Secondary | ICD-10-CM

## 2017-08-11 MED ORDER — HYDROCODONE-ACETAMINOPHEN 5-325 MG PO TABS: 1.0000 | ORAL_TABLET | Freq: Two times a day (BID) | ORAL | 0 refills | Status: DC | PRN

## 2017-08-11 NOTE — Patient Instructions (Signed)
ASPERCREME

## 2017-08-11 NOTE — Progress Notes (Signed)
Postop visit  Surgery date January 10 this is postop day #29 patient complains of stiffness in the morning and weakness in the right knee.  She is not taking any ibuprofen because a history of ulcers she is on Norco and would like a refill  Her knee overall looks good she has minimal effusion flexion 110 degrees she has full extension she walks without a limp today but says sometimes she does feel like she is limping  Recommend Aspercreme 3 times a day refill Norco every 12 follow-up in 4 weeks  Encounter Diagnosis  Name Primary?  . S/P right knee arthroscopy 07/13/17 Yes

## 2017-09-05 ENCOUNTER — Ambulatory Visit (HOSPITAL_COMMUNITY): Payer: 59 | Admitting: Psychiatry

## 2017-09-05 ENCOUNTER — Encounter (HOSPITAL_COMMUNITY): Payer: Self-pay | Admitting: Psychiatry

## 2017-09-05 VITALS — BP 127/84 | HR 70 | Ht 63.0 in | Wt 156.0 lb

## 2017-09-05 DIAGNOSIS — Z813 Family history of other psychoactive substance abuse and dependence: Secondary | ICD-10-CM

## 2017-09-05 DIAGNOSIS — Z818 Family history of other mental and behavioral disorders: Secondary | ICD-10-CM

## 2017-09-05 DIAGNOSIS — Z87891 Personal history of nicotine dependence: Secondary | ICD-10-CM | POA: Diagnosis not present

## 2017-09-05 DIAGNOSIS — F332 Major depressive disorder, recurrent severe without psychotic features: Secondary | ICD-10-CM | POA: Diagnosis not present

## 2017-09-05 DIAGNOSIS — Z560 Unemployment, unspecified: Secondary | ICD-10-CM

## 2017-09-05 DIAGNOSIS — R413 Other amnesia: Secondary | ICD-10-CM | POA: Diagnosis not present

## 2017-09-05 DIAGNOSIS — M2550 Pain in unspecified joint: Secondary | ICD-10-CM | POA: Diagnosis not present

## 2017-09-05 MED ORDER — ALPRAZOLAM 1 MG PO TABS: 1.0000 mg | ORAL_TABLET | Freq: Three times a day (TID) | ORAL | 2 refills | Status: DC | PRN

## 2017-09-05 MED ORDER — FLUOXETINE HCL 20 MG PO CAPS
20.0000 mg | ORAL_CAPSULE | Freq: Two times a day (BID) | ORAL | 2 refills | Status: DC
Start: 2017-09-05 — End: 2018-02-13

## 2017-09-05 MED ORDER — TRAZODONE HCL 100 MG PO TABS: 100.0000 mg | ORAL_TABLET | Freq: Every day | ORAL | 2 refills | Status: DC

## 2017-09-05 MED ORDER — ARIPIPRAZOLE 2 MG PO TABS: 2.0000 mg | ORAL_TABLET | Freq: Every day | ORAL | 2 refills | Status: DC

## 2017-09-05 NOTE — Progress Notes (Signed)
[name] MD/PA/NP OP Progress Note  09/05/2017 2:25 PM [name]  MRN:  [id number]  Chief Complaint:  Chief Complaint    Depression; ADD; Anxiety; Follow-up     HPI: this patient is a 57 year old married white female who lives with her husband in [place]. She has no children. 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  The patient was referred by her primary physician, Dr. [name], for further assessment and treatment of depression anxiety and focus problems.  The patient states that she did well most of her life until she had a stroke in 66 at age 70. She had a cerebral aneurysm that burst and she had to have a hematoma evacuated from the right frontal lobe. She has had resulting difficulties ever since and still has weakness on the left side of her body and poor fine motor skills in her left hand. She is right handed. She states that she gets older her symptoms worsen.  The patient often feels anxious particular in crowds. She has frequent panic attacks. She takes Xanax 1 mg twice a day but is reluctant to take more. Shortly after she had her stroke she saw psychiatrist in [place] and was placed on the Xanax. Dr. [name] later put her on Effexor and she is now up to 150 mg per day. [name] not sure if it's helping. She has difficulty sleeping but trazodone helps to some degree. She also is significant problems with focus and attention span. Her thoughts ramble. She'll start one thing and go the next. She can't complete tasks. She finds this extremely frustrating. Her mood is labile at times and she'll get angry quickly and for no reason. She denies auditory or visual hallucinations or paranoia. She does not use drugs and very rarely takes a drink. She admits that time she has passive suicidal ideation but would never hurt her self because of her faith  Patient returns after 4 weeks.  Last time we try to add Adderall to her  regimen to help with her motivation.  It really has not done much for her.  She still claims that she has no get up and go.  She feels a bit more depressed but she is not suicidal.  She still waiting to hear from her disability and she is been told that she may have to wait up to another 3 months.  At least she has an endpoint for this.  I suggested that we drop the Adderall this did not work nor did the Ritalin.  Instead we will try increasing the Prozac to help her mood.  She is sleeping well and her anxiety is under good control Visit Diagnosis:    ICD-10-CM   1. Major depressive disorder, recurrent, severe without psychotic features ([name]) F33.2   2. Memory deficit R41.3     Past Psychiatric History: Past outpatient treatment for depression  Past Medical History:  Past Medical History:  Diagnosis Date  . Allergy    grass, dust , mold  . Anxiety   . Arthritis   . Bipolar disorder ([place])   . Cancer (HCC)    cervical cancer  . Carpal tunnel syndrome    Bilateral  . Chest pain 09/2011   Cardiac cath-normal coronaries  . Constipation   . Depression   . Difficulty urinating 05/31/2013  . Elevated LFTs 12/16/2013  . GERD (gastroesophageal reflux disease)   . History of kidney stones   .  Hyperlipemia   . Hyperlipidemia   . Hypertension    Mild; provoked by stress and anxiety  . IBS (irritable bowel syndrome)   . Intracerebral bleed ([place])    No aneurysm; followed by Dr. Sherwood Gambler  . Loss of weight 01/06/2015  . Osteoporosis   . Stroke [facility]) 1999   hemorrhagic stroke; weakness of left side    Past Surgical History:  Procedure Laterality Date  . ABDOMINAL HYSTERECTOMY     "cancer cells"  . [place]   to remove blood clot after stroke   . CARDIAC CATHETERIZATION  2016  . CERVICAL FUSION    . CHOLECYSTECTOMY N/A 10/14/2014   Procedure: LAPAROSCOPIC CHOLECYSTECTOMY WITH INTRAOPERATIVE CHOLANGIOGRAM;  Surgeon: Jackolyn Confer, MD;  Location: [facility];  Service: General;   Laterality: N/A;  . CHONDROPLASTY Right 07/13/2017   Procedure: CHONDROPLASTY of patella;  Surgeon: Carole Civil, MD;  Location: [facility];  Service: Orthopedics;  Laterality: Right;  . EUS N/A 08/21/2015   Procedure: ESOPHAGEAL ENDOSCOPIC ULTRASOUND (EUS) RADIAL;  Surgeon: Carol Ada, MD;  Location: [facility];  Service: Endoscopy;  Laterality: N/A;  . KNEE ARTHROSCOPY WITH MEDIAL MENISECTOMY Right 07/13/2017   Procedure: KNEE ARTHROSCOPY WITH PARTIAL MEDIAL MENISECTOMY;  Surgeon: Carole Civil, MD;  Location: [facility];  Service: Orthopedics;  Laterality: Right;  . LEFT HEART CATHETERIZATION WITH CORONARY ANGIOGRAM N/A 09/23/2011   Procedure: LEFT HEART CATHETERIZATION WITH CORONARY ANGIOGRAM;  Surgeon: Thayer Headings, MD;  Location: [facility] C[facility];  Service: Cardiovascular;  Laterality: N/A;  . LIPOMA EXCISION Left 11/18/2013   Procedure: EXCISION OF SOFT TISSUE MASS-LEFT THIGH;  Surgeon: Jamesetta So, MD;  Location: [facility];  Service: General;  Laterality: Left;  . RECTOCELE REPAIR     x2  . RECTOCELE REPAIR N/A 04/04/2017   Procedure: POSTERIOR REPAIR (RECTOCELE);  Surgeon: Jonnie Kind, MD;  Location: [facility];  Service: Gynecology;  Laterality: N/A;    Family Psychiatric History: See below  Family History:  Family History  Problem Relation Age of Onset  . Cancer Mother        breast   . Hypertension Mother   . Hyperlipidemia Mother   . Depression Mother   . Anxiety disorder Mother   . COPD Mother   . Arthritis Mother        rheumatoid  . Drug abuse Sister   . Coronary artery disease Paternal Grandfather   . Coronary artery disease Paternal Uncle   . Depression Cousin   . Drug abuse Cousin     Social History:  Social History   Socioeconomic History  . Marital status: Married    Spouse name: Sonia Side  . Number of children: 0  . Years of education: HS  . Highest education level: None  Social Needs  . Financial resource strain: Not hard at all  . Food insecurity  - worry: Never true  . Food insecurity - inability: Never true  . Transportation needs - medical: No  . Transportation needs - non-medical: No  Occupational History  . Occupation: unemployed    Comment: pending disability  Tobacco Use  . Smoking status: Former Smoker    Packs/day: 1.00    Years: 19.00    Pack years: 19.00    Types: Cigarettes    Last attempt to quit: 09/01/1997    Years since quitting: 20.0  . Smokeless tobacco: Never Used  . Tobacco comment: Quit smoking 1999 , previous 20 pack years  Substance and Sexual Activity  .  Alcohol use: Yes    Comment: 1 drink every other week  . Drug use: No  . Sexual activity: Not Currently    Partners: Male    Birth control/protection: Surgical  Other Topics Concern  . None  Social History Narrative   Currently unable to work   Lives in [place]   Married   Patient drinks 1 cup of caffeine daily.   Patient is right handed.    Joined the [employer] to get more exercise    Allergies:  Allergies  Allergen Reactions  . Morphine And Related Hives  . Promethazine Hcl Other (See Comments)    Causes patient to become Hyper    Metabolic Disorder Labs: Lab Results  Component Value Date   HGBA1C 5.3 06/02/2017   MPG 117 (H) 01/06/2015   MPG 117 (H) 02/17/2014   No results found for: PROLACTIN Lab Results  Component Value Date   CHOL 206 (H) 06/02/2017   TRIG 141 06/02/2017   HDL 76 06/02/2017   CHOLHDL 2.3 09/16/2015   VLDL 26 09/16/2015   LDLCALC 102 (H) 06/02/2017   LDLCALC 69 09/16/2015   Lab Results  Component Value Date   TSH 1.438 09/20/2011    Therapeutic Level Labs: No results found for: LITHIUM No results found for: VALPROATE No components found for:  CBMZ  Current Medications: Current Outpatient Medications  Medication Sig Dispense Refill  . acetaminophen (TYLENOL) 500 MG tablet Take 1,000 mg by mouth every 6 (six) hours as needed for moderate pain.     Marland Kitchen alendronate (FOSAMAX) 10 MG tablet     .  ALPRAZolam (XANAX) 1 MG tablet Take 1 tablet (1 mg total) by mouth 3 (three) times daily as needed for anxiety. 90 tablet 2  . ARIPiprazole (ABILIFY) 2 MG tablet Take 1 tablet (2 mg total) by mouth daily. 90 tablet 2  . Ascorbic Acid (VITAMIN C) 1000 MG tablet Take 1,000 mg by mouth daily.    . B Complex-C (SUPER B COMPLEX PO) Take 1 tablet by mouth daily.     . cetirizine (ZYRTEC) 10 MG tablet Take 10 mg by mouth daily as needed for allergies.     . cycloSPORINE (RESTASIS) 0.05 % ophthalmic emulsion Place 1 drop into both eyes 2 (two) times daily.     Marland Kitchen FLUoxetine (PROZAC) 20 MG capsule Take 1 capsule (20 mg total) by mouth 2 (two) times daily. 180 capsule 2  . HYDROcodone-acetaminophen (NORCO) 5-325 MG tablet Take 1 tablet by mouth every 12 (twelve) hours as needed for moderate pain. 14 tablet 0  . ibuprofen (ADVIL,MOTRIN) 800 MG tablet Take 1 tablet (800 mg total) by mouth every 8 (eight) hours as needed. 90 tablet 1  . lisinopril (PRINIVIL,ZESTRIL) 20 MG tablet Take 20 mg by mouth daily.     . Magnesium 250 MG TABS Take 250 mg by mouth daily.     . Multiple Vitamin (MULITIVITAMIN WITH MINERALS) TABS Take 1 tablet by mouth daily.    Marland Kitchen nystatin (NYSTATIN) powder Apply 1 g topically daily.    . pantoprazole (PROTONIX) 40 MG tablet Take 40 mg by mouth daily.     . polyethylene glycol powder (GLYCOLAX/MIRALAX) powder Take 17 g by mouth daily. 3350 g 6  . Potassium 99 MG TABS Take 99 mg by mouth daily.     . simvastatin (ZOCOR) 20 MG tablet TAKE ONE TABLET BY MOUTH ONCE DAILY AT BEDTIME (Patient taking differently: TAKE 20 MG BY MOUTH ONCE DAILY AT BEDTIME) 30 tablet  6  . traZODone (DESYREL) 100 MG tablet Take 1 tablet (100 mg total) by mouth at bedtime. 90 tablet 2   No current facility-administered medications for this visit.      Musculoskeletal: Strength & Muscle Tone: within normal limits Gait & Station: normal Patient leans: N/A  Psychiatric Specialty Exam: Review of Systems   Constitutional: Positive for malaise/fatigue.  Musculoskeletal: Positive for joint pain.  Psychiatric/Behavioral: Positive for depression.  All other systems reviewed and are negative.   Blood pressure 127/84, pulse 70, height 5' 3"  (1.6 m), weight 156 lb (70.8 kg), SpO2 98 %.Body mass index is 27.63 kg/m.  General Appearance: Casual, Neat and Well Groomed  Eye Contact:  Fair  Speech:  Clear and Coherent  Volume:  Normal  Mood:  Dysphoric  Affect:  Congruent  Thought Process:  Goal Directed  Orientation:  Full (Time, Place, and Person)  Thought Content: Rumination   Suicidal Thoughts:  No  Homicidal Thoughts:  No  Memory: Immediate memory is fair but at times seems worse than others when she is more depressed  Judgement:  Fair  Insight:  Fair  Psychomotor Activity:  Decreased  Concentration:  Concentration: Fair and Attention Span: Fair  Recall:  Good  Fund of Knowledge: Good  Language: Good  Akathisia:  No  Handed:  Right  AIMS (if indicated): not done  Assets:  Communication Skills Desire for Improvement Resilience Social Support Talents/Skills  ADL's:  Intact  Cognition: WNL  Sleep:  Good   Screenings: MDI     Office Visit from 01/22/2016 in [facility]  Total Score (max 50)  34    Mini-Mental     Office Visit from 02/13/2015 in [facility]  Total Score (max 30 points )  26    PHQ2-9     Office Visit from 05/09/2017 in [facility] Office Visit from 09/16/2015 in [facility]  PHQ-2 Total Score  0  4  PHQ-9 Total Score  No data  10    SBQ-R     Office Visit from 01/22/2016 in [facility]  SBQ-R Total Score  14.1       Assessment and Plan: Patient is a 57 year old female with a history of depression anxiety and memory loss from a previous stroke.  She states that her energy and motivation are low so we will increase Prozac to 20  mg twice a day.  She will discontinue Adderall as it has not helped.  She will continue Xanax 1 mg 3 times daily as needed for anxiety and trazodone 100 mg at bedtime for sleep.  She will return to see me in 6 weeks   [name], MD 09/05/2017, 2:25 PM

## 2017-09-08 ENCOUNTER — Ambulatory Visit: Payer: Self-pay | Admitting: Orthopedic Surgery

## 2017-09-11 ENCOUNTER — Encounter: Payer: Self-pay | Admitting: Family Medicine

## 2017-09-12 ENCOUNTER — Ambulatory Visit (INDEPENDENT_AMBULATORY_CARE_PROVIDER_SITE_OTHER): Payer: 59 | Admitting: Orthopedic Surgery

## 2017-09-12 ENCOUNTER — Encounter: Payer: Self-pay | Admitting: Orthopedic Surgery

## 2017-09-12 VITALS — BP 138/77 | HR 68 | Ht 63.0 in | Wt 155.0 lb

## 2017-09-12 DIAGNOSIS — M7051 Other bursitis of knee, right knee: Secondary | ICD-10-CM

## 2017-09-12 DIAGNOSIS — M5136 Other intervertebral disc degeneration, lumbar region: Secondary | ICD-10-CM

## 2017-09-12 DIAGNOSIS — M1712 Unilateral primary osteoarthritis, left knee: Secondary | ICD-10-CM

## 2017-09-12 DIAGNOSIS — Z9889 Other specified postprocedural states: Secondary | ICD-10-CM

## 2017-09-12 DIAGNOSIS — M23321 Other meniscus derangements, posterior horn of medial meniscus, right knee: Secondary | ICD-10-CM

## 2017-09-12 MED ORDER — HYDROCODONE-ACETAMINOPHEN 5-325 MG PO TABS: 1.0000 | ORAL_TABLET | Freq: Four times a day (QID) | ORAL | 0 refills | Status: DC | PRN

## 2017-09-12 NOTE — Patient Instructions (Signed)
Ice  aspercreme No biking for 3 weeks   Fu 6 weeks   Try to stop taking hydrocodone

## 2017-09-12 NOTE — Progress Notes (Signed)
POST OP VISIT   Patient ID: [name], adult   DOB: 11-01-1960, 58 y.o.   MRN: [id number]  Chief Complaint  Patient presents with  . Post-op Follow-up    right knee arthroscopy 07/13/17    Encounter Diagnoses  Name Primary?  . S/P right knee arthroscopy 07/13/17   . Primary osteoarthritis of left knee   . Derangement of posterior horn of medial meniscus of right knee   . DDD (degenerative disc disease), lumbar Yes    Findings at surgery:   Preop diagnosis torn medial meniscus right knee  Postop diagnosis torn medial meniscus right knee chondro malacia of the patella  Procedure arthroscopy right knee partial medial meniscectomy and chondroplasty of the [id number]   She complains of inability to get up from a squatting kneeling position and that she cannot walk or ride the bike like she could before the surgery  She has pain over the medial hamstring tendons none over the joint line some pain behind the knee joint  She can flex 125 degrees she has full extension there is no atrophy of the quadriceps mechanism  Impression Encounter Diagnoses  Name Primary?  . S/P right knee arthroscopy 07/13/17   . Primary osteoarthritis of left knee   . Derangement of posterior horn of medial meniscus of right knee   . DDD (degenerative disc disease), lumbar Yes  . Pes anserinus bursitis of right knee     We injected the Pes bursa  Procedure note right knee injection for bursitis  verbal consent was obtained to inject right knee PES BURSA  Timeout was completed to confirm the site of injection  The medications used were 40 mg of Depo-Medrol and 1% lidocaine 3 cc  Anesthesia was provided by ethyl chloride and the skin was prepped with alcohol.  After cleaning the skin with alcohol a 25-gauge needle was used to inject the right knee bursa.  There were no complications and a sterile bandage was applied   Meds ordered this encounter  Medications  . HYDROcodone-acetaminophen  (NORCO/VICODIN) 5-325 MG tablet    Sig: Take 1 tablet by mouth every 6 (six) hours as needed for moderate pain.    Dispense:  10 tablet    Refill:  0    Follow-up in 6 weeks Advised not to do any biking for the next 3 weeks Use ice and Aspercreme Taper the opioid medication

## 2017-10-31 ENCOUNTER — Ambulatory Visit (INDEPENDENT_AMBULATORY_CARE_PROVIDER_SITE_OTHER): Payer: 59

## 2017-10-31 ENCOUNTER — Encounter: Payer: Self-pay | Admitting: Orthopedic Surgery

## 2017-10-31 ENCOUNTER — Ambulatory Visit (INDEPENDENT_AMBULATORY_CARE_PROVIDER_SITE_OTHER): Payer: 59 | Admitting: Orthopedic Surgery

## 2017-10-31 VITALS — BP 142/84 | HR 71 | Ht 63.0 in | Wt 154.0 lb

## 2017-10-31 DIAGNOSIS — M7051 Other bursitis of knee, right knee: Secondary | ICD-10-CM

## 2017-10-31 DIAGNOSIS — M25561 Pain in right knee: Secondary | ICD-10-CM

## 2017-10-31 DIAGNOSIS — G8929 Other chronic pain: Secondary | ICD-10-CM | POA: Diagnosis not present

## 2017-10-31 DIAGNOSIS — Z9889 Other specified postprocedural states: Secondary | ICD-10-CM

## 2017-10-31 NOTE — Patient Instructions (Addendum)
ICE  ICY HOT AS NEEDED   Bursitis Bursitis is inflammation and irritation of a bursa, which is one of the small, fluid-filled sacs that cushion and protect the moving parts of your body. These sacs are located between bones and muscles, muscle attachments, or skin areas next to bones. A bursa protects these structures from the wear and tear that results from frequent movement. An inflamed bursa causes pain and swelling. Fluid may build up inside the sac. Bursitis is most common near joints, especially the knees, elbows, hips, and shoulders. What are the causes? Bursitis can be caused by:  Injury from: ? A direct blow, like falling on your knee or elbow. ? Overuse of a joint (repetitive stress).  Infection. This can happen if bacteria gets into a bursa through a cut or scrape near a joint.  Diseases that cause joint inflammation, such as gout and rheumatoid arthritis.  What increases the risk? You may be at risk for bursitis if you:  Have a job or hobby that involves a lot of repetitive stress on your joints.  Have a condition that weakens your body's defense system (immune system), such as diabetes, cancer, or HIV.  Lift and reach overhead often.  Kneel or lean on hard surfaces often.  Run or walk often.  What are the signs or symptoms? The most common signs and symptoms of bursitis are:  Pain that gets worse when you move the affected body part or put weight on it.  Inflammation.  Stiffness.  Other signs and symptoms may include:  Redness.  Tenderness.  Warmth.  Pain that continues after rest.  Fever and chills. This may occur in bursitis caused by infection.  How is this diagnosed? Bursitis may be diagnosed by:  Medical history and physical exam.  MRI.  A procedure to drain fluid from the bursa with a needle (aspiration). The fluid may be checked for signs of infection or gout.  Blood tests to rule out other causes of inflammation.  How is this  treated? Bursitis can usually be treated at home with rest, ice, compression, and elevation (RICE). For mild bursitis, RICE treatment may be all you need. Other treatments may include:  Nonsteroidal anti-inflammatory drugs (NSAIDs) to treat pain and inflammation.  Corticosteroids to fight inflammation. You may have these drugs injected into and around the area of bursitis.  Aspiration of bursitis fluid to relieve pain and improve movement.  Antibiotic medicine to treat an infected bursa.  A splint, brace, or walking aid.  Physical therapy if you continue to have pain or limited movement.  Surgery to remove a damaged or infected bursa. This may be needed if you have a very bad case of bursitis or if other treatments have not worked.  Follow these instructions at home:  Take medicines only as directed by your health care provider.  If you were prescribed an antibiotic medicine, finish it all even if you start to feel better.  Rest the affected area as directed by your health care provider. ? Keep the area elevated. ? Avoid activities that make pain worse.  Apply ice to the injured area: ? Place ice in a plastic bag. ? Place a towel between your skin and the bag. ? Leave the ice on for 20 minutes, 2-3 times a day.  Use splints, braces, pads, or walking aids as directed by your health care provider.  Keep all follow-up visits as directed by your health care provider. This is important. How is this prevented?  Wear knee pads if you kneel often.  Wear sturdy running or walking shoes that fit you well.  Take regular breaks from repetitive activity.  Warm up by stretching before doing any strenuous activity.  Maintain a healthy weight or lose weight as recommended by your health care provider. Ask your health care provider if you need help.  Exercise regularly. Start any new physical activity gradually. Contact a health care provider if:  Your bursitis is not responding to  treatment or home care.  You have a fever.  You have chills. This information is not intended to replace advice given to you by your health care provider. Make sure you discuss any questions you have with your health care provider. Document Released: 06/17/2000 Document Revised: 11/26/2015 Document Reviewed: 09/09/2013 Elsevier Interactive Patient Education  2018 [employer].

## 2017-10-31 NOTE — Progress Notes (Signed)
Progress Note   Patient ID: [name], adult   DOB: 1960-10-30, 57 y.o.   MRN: [id number]  Chief Complaint  Patient presents with  . Knee Pain    had right knee arthroscopy 07/13/17 felt pop couple weeks ago getting off couch now with pain behind patella     Medical decision-making Encounter Diagnoses  Name Primary?  . S/P right knee arthroscopy 07/13/17 Yes  . Chronic pain of right knee   . Pes anserinus bursitis of right knee    X-ray ordered: X-ray today shows arthritis of the knee small effusion arthritis is mild   No orders of the defined types were placed in this encounter.  Procedure note right knee injection for bursitis  verbal consent was obtained to inject right knee PES BURSA  Timeout was completed to confirm the site of injection  The medications used were 40 mg of Depo-Medrol and 1% lidocaine 3 cc  Anesthesia was provided by ethyl chloride and the skin was prepped with alcohol.  After cleaning the skin with alcohol a 25-gauge needle was used to inject the right knee bursa.  There were no complications and a sterile bandage was applied   PLAN:  I injected the bursa of the right knee.  Recommend ice and icy hot as needed  Follow-up 6 months recheck knee arthritis and bursitis.    Chief Complaint  Patient presents with  . Knee Pain    had right knee arthroscopy 07/13/17 felt pop couple weeks ago getting off couch now with pain behind patella    57 year old female had arthroscopy in January got up felt a pop getting out of the couch pain was behind the knee she complains of difficulty getting up from a squatted or seated position as well as persistent medial pain despite injections of the bursa.  No catching locking or giving way is noted she has mild pain behind the knee which is dull      Review of Systems  Constitutional: Negative for chills and fever.  Neurological: Negative for tingling, tremors, focal weakness and weakness.   PLAN: Past  Medical History:  Diagnosis Date  . Allergy    grass, dust , mold  . Anxiety   . Arthritis   . Bipolar disorder ([place])   . Cancer (HCC)    cervical cancer  . Carpal tunnel syndrome    Bilateral  . Chest pain 09/2011   Cardiac cath-normal coronaries  . Constipation   . Depression   . Difficulty urinating 05/31/2013  . Elevated LFTs 12/16/2013  . GERD (gastroesophageal reflux disease)   . History of kidney stones   . Hyperlipemia   . Hyperlipidemia   . Hypertension    Mild; provoked by stress and anxiety  . IBS (irritable bowel syndrome)   . Intracerebral bleed ([place])    No aneurysm; followed by Dr. Sherwood Gambler  . Loss of weight 01/06/2015  . Osteoporosis   . Stroke [facility]) 1999   hemorrhagic stroke; weakness of left side    Current Meds  Medication Sig  . acetaminophen (TYLENOL) 500 MG tablet Take 1,000 mg by mouth every 6 (six) hours as needed for moderate pain.   Marland Kitchen alendronate (FOSAMAX) 10 MG tablet   . ALPRAZolam (XANAX) 1 MG tablet Take 1 tablet (1 mg total) by mouth 3 (three) times daily as needed for anxiety.  . Ascorbic Acid (VITAMIN C) 1000 MG tablet Take 1,000 mg by mouth daily.  . B Complex-C (SUPER B COMPLEX PO) Take  1 tablet by mouth daily.   . cetirizine (ZYRTEC) 10 MG tablet Take 10 mg by mouth daily as needed for allergies.   . cycloSPORINE (RESTASIS) 0.05 % ophthalmic emulsion Place 1 drop into both eyes 2 (two) times daily.   Marland Kitchen FLUoxetine (PROZAC) 20 MG capsule Take 1 capsule (20 mg total) by mouth 2 (two) times daily.  . Magnesium 250 MG TABS Take 250 mg by mouth daily.   . Multiple Vitamin (MULITIVITAMIN WITH MINERALS) TABS Take 1 tablet by mouth daily.  Marland Kitchen nystatin (NYSTATIN) powder Apply 1 g topically daily.  . pantoprazole (PROTONIX) 40 MG tablet Take 40 mg by mouth daily.   . polyethylene glycol powder (GLYCOLAX/MIRALAX) powder Take 17 g by mouth daily.  . Potassium 99 MG TABS Take 99 mg by mouth daily.   . simvastatin (ZOCOR) 20 MG tablet TAKE ONE TABLET BY  MOUTH ONCE DAILY AT BEDTIME (Patient taking differently: TAKE 20 MG BY MOUTH ONCE DAILY AT BEDTIME)  . traZODone (DESYREL) 100 MG tablet Take 1 tablet (100 mg total) by mouth at bedtime.    Allergies  Allergen Reactions  . Morphine And Related Hives  . Promethazine Hcl Other (See Comments)    Causes patient to become Hyper     BP (!) 142/84   Pulse 71   Ht 5' 3"  (1.6 m)   Wt 154 lb (69.9 kg)   BMI 27.28 kg/m   Physical Exam  Constitutional: She is oriented to person, place, and time. She appears well-developed and well-nourished.  Vital signs have been reviewed and are stable. Gen. appearance the patient is well-developed and well-nourished with normal grooming and hygiene.   Musculoskeletal:       Legs: Neurological: She is alert and oriented to person, place, and time.  Skin: Skin is warm and dry. No erythema.  Psychiatric: She has a normal mood and affect.  Vitals reviewed.      Arther Abbott, MD 10/31/2017 4:00 PM

## 2017-11-06 ENCOUNTER — Ambulatory Visit (HOSPITAL_COMMUNITY): Payer: 59 | Admitting: Psychiatry

## 2017-12-07 ENCOUNTER — Ambulatory Visit: Payer: Self-pay | Admitting: Family Medicine

## 2017-12-21 ENCOUNTER — Other Ambulatory Visit: Payer: Self-pay | Admitting: Internal Medicine

## 2017-12-21 DIAGNOSIS — R1084 Generalized abdominal pain: Secondary | ICD-10-CM

## 2017-12-21 DIAGNOSIS — R197 Diarrhea, unspecified: Secondary | ICD-10-CM

## 2017-12-25 ENCOUNTER — Ambulatory Visit (HOSPITAL_COMMUNITY)
Admission: RE | Admit: 2017-12-25 | Discharge: 2017-12-25 | Disposition: A | Discharge status: Home / Self Care | Payer: 59 | Source: Ambulatory Visit | Attending: Internal Medicine | Admitting: Internal Medicine

## 2017-12-25 ENCOUNTER — Other Ambulatory Visit: Payer: Self-pay | Admitting: Obstetrics and Gynecology

## 2017-12-25 DIAGNOSIS — R197 Diarrhea, unspecified: Secondary | ICD-10-CM | POA: Diagnosis present

## 2017-12-25 DIAGNOSIS — K760 Fatty (change of) liver, not elsewhere classified: Secondary | ICD-10-CM | POA: Diagnosis not present

## 2017-12-25 DIAGNOSIS — R1084 Generalized abdominal pain: Secondary | ICD-10-CM | POA: Diagnosis present

## 2017-12-25 DIAGNOSIS — Z9049 Acquired absence of other specified parts of digestive tract: Secondary | ICD-10-CM | POA: Diagnosis not present

## 2017-12-25 DIAGNOSIS — Z1231 Encounter for screening mammogram for malignant neoplasm of breast: Secondary | ICD-10-CM

## 2018-01-05 ENCOUNTER — Other Ambulatory Visit (HOSPITAL_COMMUNITY): Payer: Self-pay | Admitting: Psychiatry

## 2018-01-17 ENCOUNTER — Ambulatory Visit (HOSPITAL_COMMUNITY)
Admission: RE | Admit: 2018-01-17 | Discharge: 2018-01-17 | Disposition: A | Discharge status: Home / Self Care | Payer: 59 | Source: Ambulatory Visit | Attending: Obstetrics and Gynecology | Admitting: Obstetrics and Gynecology

## 2018-01-17 DIAGNOSIS — Z1231 Encounter for screening mammogram for malignant neoplasm of breast: Secondary | ICD-10-CM | POA: Insufficient documentation

## 2018-02-13 ENCOUNTER — Ambulatory Visit (INDEPENDENT_AMBULATORY_CARE_PROVIDER_SITE_OTHER): Payer: 59 | Admitting: Psychiatry

## 2018-02-13 ENCOUNTER — Encounter (HOSPITAL_COMMUNITY): Payer: Self-pay | Admitting: Psychiatry

## 2018-02-13 VITALS — BP 114/79 | HR 80 | Ht 63.0 in | Wt 153.0 lb

## 2018-02-13 DIAGNOSIS — F332 Major depressive disorder, recurrent severe without psychotic features: Secondary | ICD-10-CM | POA: Diagnosis not present

## 2018-02-13 MED ORDER — TRAZODONE HCL 100 MG PO TABS: 100.0000 mg | ORAL_TABLET | Freq: Every day | ORAL | 2 refills | Status: DC

## 2018-02-13 MED ORDER — VILAZODONE HCL 40 MG PO TABS: 40.0000 mg | ORAL_TABLET | Freq: Every day | ORAL | 2 refills | Status: DC

## 2018-02-13 MED ORDER — ALPRAZOLAM 1 MG PO TABS: 1.0000 mg | ORAL_TABLET | Freq: Three times a day (TID) | ORAL | 2 refills | Status: DC

## 2018-02-13 NOTE — Progress Notes (Signed)
[facility] MD/PA/NP OP Progress Note  02/13/2018 2:51 PM [name]  MRN:  [id number]  Chief Complaint:  Chief Complaint    Depression; Anxiety; Follow-up     HPI: this patient is a 57 year old married white female who lives with her husband in [place]. She has no children. 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  The patient was referred by her primary physician, Dr. [name], for further assessment and treatment of depression anxiety and focus problems.  The patient states that she did well most of her life until she had a stroke in 83 at age 7. She had a cerebral aneurysm that burst and she had to have a hematoma evacuated from the right frontal lobe. She has had resulting difficulties ever since and still has weakness on the left side of her body and poor fine motor skills in her left hand. She is right handed. She states that she gets older her symptoms worsen.  The patient often feels anxious particular in crowds. She has frequent panic attacks. She takes Xanax 1 mg twice a day but is reluctant to take more. Shortly after she had her stroke she saw psychiatrist in [place] and was placed on the Xanax. Dr. [name] later put her on Effexor and she is now up to 150 mg per day. [name] not sure if it's helping. She has difficulty sleeping but trazodone helps to some degree. She also is significant problems with focus and attention span. Her thoughts ramble. She'll start one thing and go the next. She can't complete tasks. She finds this extremely frustrating. Her mood is labile at times and she'll get angry quickly and for no reason. She denies auditory or visual hallucinations or paranoia. She does not use drugs and very rarely takes a drink. She admits that time she has passive suicidal ideation but would never hurt her self because of her faith  The patient returns after 5 months.  She has missed some appointments.  She  states that she tried to wean herself off Xanax but it made her very shaky and she does not want to get off of it.  It helps her anxiety.  She is sleeping well with the trazodone.  However she is depressed and does not think the Prozac is helping.  She claims that she has no energy.  She is now seeing Dr. Nevada Crane for primary care and she states he is running a lot of blood test but so far nothing is really turned up.  She denies being suicidal right now but states that she had those thoughts when she was trying to take herself off the Xanax.  I suggested we try different medication such as Viibryd and she is willing to give it a try. Visit Diagnosis:    ICD-10-CM   1. Major depressive disorder, recurrent, severe without psychotic features ([place]) F33.2     Past Psychiatric History: past outpatient treatment for depression  Past Medical History:  Past Medical History:  Diagnosis Date  . Allergy    grass, dust , mold  . Anxiety   . Arthritis   . Bipolar disorder ([place])   . Cancer (HCC)    cervical cancer  . Carpal tunnel syndrome    Bilateral  . Chest pain 09/2011   Cardiac cath-normal coronaries  . Constipation   . Depression   . Difficulty urinating 05/31/2013  . Elevated LFTs 12/16/2013  . GERD (  gastroesophageal reflux disease)   . History of kidney stones   . Hyperlipemia   . Hyperlipidemia   . Hypertension    Mild; provoked by stress and anxiety  . IBS (irritable bowel syndrome)   . Intracerebral bleed ([place])    No aneurysm; followed by Dr. Sherwood Gambler  . Loss of weight 01/06/2015  . Osteoporosis   . Stroke [facility]) 1999   hemorrhagic stroke; weakness of left side    Past Surgical History:  Procedure Laterality Date  . ABDOMINAL HYSTERECTOMY     "cancer cells"  . [place]   to remove blood clot after stroke   . CARDIAC CATHETERIZATION  2016  . CERVICAL FUSION    . CHOLECYSTECTOMY N/A 10/14/2014   Procedure: LAPAROSCOPIC CHOLECYSTECTOMY WITH INTRAOPERATIVE CHOLANGIOGRAM;   Surgeon: Jackolyn Confer, MD;  Location: [facility];  Service: General;  Laterality: N/A;  . CHONDROPLASTY Right 07/13/2017   Procedure: CHONDROPLASTY of patella;  Surgeon: Carole Civil, MD;  [facility];  Service: Orthopedics;  Laterality: Right;  . EUS N/A 08/21/2015   Procedure: ESOPHAGEAL ENDOSCOPIC ULTRASOUND (EUS) RADIAL;  Surgeon: Carol Ada, MD;  [facility];  Service: Endoscopy;  Laterality: N/A;  . KNEE ARTHROSCOPY WITH MEDIAL MENISECTOMY Right 07/13/2017   Procedure: KNEE ARTHROSCOPY WITH PARTIAL MEDIAL MENISECTOMY;  Surgeon: Carole Civil, MD;  [facility];  Service: Orthopedics;  Laterality: Right;  . LEFT HEART CATHETERIZATION WITH CORONARY ANGIOGRAM N/A 09/23/2011   Procedure: LEFT HEART CATHETERIZATION WITH CORONARY ANGIOGRAM;  Surgeon: Thayer Headings, MD;  Location: [facility] CATH LAB;  Service: Cardiovascular;  Laterality: N/A;  . LIPOMA EXCISION Left 11/18/2013   Procedure: EXCISION OF SOFT TISSUE MASS-LEFT THIGH;  Surgeon: Jamesetta So, MD;  Location[facility];  Service: General;  Laterality: Left;  . RECTOCELE REPAIR     x2  . RECTOCELE REPAIR N/A 04/04/2017   Procedure: POSTERIOR REPAIR (RECTOCELE);  Surgeon: Jonnie Kind, MD;  [facility];  Service: Gynecology;  Laterality: N/A;    Family Psychiatric History: See below  Family History:  Family History  Problem Relation Age of Onset  . Cancer Mother        breast   . Hypertension Mother   . Hyperlipidemia Mother   . Depression Mother   . Anxiety disorder Mother   . COPD Mother   . Arthritis Mother        rheumatoid  . Drug abuse Sister   . Coronary artery disease Paternal Grandfather   . Coronary artery disease Paternal Uncle   . Depression Cousin   . Drug abuse Cousin     Social History:  Social History   Socioeconomic History  . Marital status: Married    Spouse name: Sonia Side  . Number of children: 0  . Years of education: HS  . Highest education level: Not on file   Occupational History  . Occupation: unemployed    Comment: pending disability  Social Needs  . Financial resource strain: Not hard at all  . Food insecurity:    Worry: Never true    Inability: Never true  . Transportation needs:    Medical: No    Non-medical: No  Tobacco Use  . Smoking status: Former Smoker    Packs/day: 1.00    Years: 19.00    Pack years: 19.00    Types: Cigarettes    Last attempt to quit: 09/01/1997    Years since quitting: 20.4  . Smokeless tobacco: Never Used  .  Tobacco comment: Quit smoking 1999 , previous 20 pack years  Substance and Sexual Activity  . Alcohol use: Yes    Comment: 1 drink every other week  . Drug use: No  . Sexual activity: Not Currently    Partners: Male    Birth control/protection: Surgical  Lifestyle  . Physical activity:    Days per week: 2 days    Minutes per session: 30 min  . Stress: Very much  Relationships  . Social connections:    Talks on phone: More than three times a week    Gets together: More than three times a week    Attends religious service: More than 4 times per year    Active member of club or organization: No    Attends meetings of clubs or organizations: Never    Relationship status: Married  Other Topics Concern  . Not on file  Social History Narrative   Currently unable to work   Lives in [place]   Married   Patient drinks 1 cup of caffeine daily.   Patient is right handed.    Joined the [employer] to get more exercise    Allergies:  Allergies  Allergen Reactions  . Morphine And Related Hives  . Promethazine Hcl Other (See Comments)    Causes patient to become Hyper    Metabolic Disorder Labs: Lab Results  Component Value Date   HGBA1C 5.3 06/02/2017   MPG 117 (H) 01/06/2015   MPG 117 (H) 02/17/2014   No results found for: PROLACTIN Lab Results  Component Value Date   CHOL 206 (H) 06/02/2017   TRIG 141 06/02/2017   HDL 76 06/02/2017   CHOLHDL 2.3 09/16/2015   VLDL 26 09/16/2015    LDLCALC 102 (H) 06/02/2017   LDLCALC 69 09/16/2015   Lab Results  Component Value Date   TSH 1.438 09/20/2011    Therapeutic Level Labs: No results found for: LITHIUM No results found for: VALPROATE No components found for:  CBMZ  Current Medications: Current Outpatient Medications  Medication Sig Dispense Refill  . acetaminophen (TYLENOL) 500 MG tablet Take 1,000 mg by mouth every 6 (six) hours as needed for moderate pain.     Marland Kitchen alendronate (FOSAMAX) 10 MG tablet     . ALPRAZolam (XANAX) 1 MG tablet Take 1 tablet (1 mg total) by mouth 3 (three) times daily. 90 tablet 2  . Ascorbic Acid (VITAMIN C) 1000 MG tablet Take 1,000 mg by mouth daily.    . B Complex-C (SUPER B COMPLEX PO) Take 1 tablet by mouth daily.     . cetirizine (ZYRTEC) 10 MG tablet Take 10 mg by mouth daily as needed for allergies.     . cycloSPORINE (RESTASIS) 0.05 % ophthalmic emulsion Place 1 drop into both eyes 2 (two) times daily.     Marland Kitchen HYDROcodone-acetaminophen (NORCO/VICODIN) 5-325 MG tablet Take 1 tablet by mouth every 6 (six) hours as needed for moderate pain. 10 tablet 0  . lisinopril (PRINIVIL,ZESTRIL) 20 MG tablet Take 20 mg by mouth daily.     . Magnesium 250 MG TABS Take 250 mg by mouth daily.     . Multiple Vitamin (MULITIVITAMIN WITH MINERALS) TABS Take 1 tablet by mouth daily.    Marland Kitchen nystatin (NYSTATIN) powder Apply 1 g topically daily.    . pantoprazole (PROTONIX) 40 MG tablet Take 40 mg by mouth daily.     . polyethylene glycol powder (GLYCOLAX/MIRALAX) powder Take 17 g by mouth daily. 3350 g 6  .  Potassium 99 MG TABS Take 99 mg by mouth daily.     . simvastatin (ZOCOR) 20 MG tablet TAKE ONE TABLET BY MOUTH ONCE DAILY AT BEDTIME (Patient taking differently: TAKE 20 MG BY MOUTH ONCE DAILY AT BEDTIME) 30 tablet 6  . traZODone (DESYREL) 100 MG tablet Take 1 tablet (100 mg total) by mouth at bedtime. 90 tablet 2  . Vilazodone HCl (VIIBRYD) 40 MG TABS Take 1 tablet (40 mg total) by mouth daily. 30 tablet 2    No current facility-administered medications for this visit.      Musculoskeletal: Strength & Muscle Tone: within normal limits Gait & Station: normal Patient leans: N/A  Psychiatric Specialty Exam: Review of Systems  Constitutional: Positive for malaise/fatigue.  Musculoskeletal: Positive for back pain.  Psychiatric/Behavioral: Positive for depression.  All other systems reviewed and are negative.   Blood pressure 114/79, pulse 80, height 5' 3"  (1.6 m), weight 153 lb (69.4 kg), SpO2 93 %.Body mass index is 27.1 kg/m.  General Appearance: Casual, Neat and Well Groomed  Eye Contact:  Good  Speech:  Clear and Coherent  Volume:  Normal  Mood:  Dysphoric  Affect:  Constricted and Flat  Thought Process:  Goal Directed  Orientation:  Full (Time, Place, and Person)  Thought Content: Rumination   Suicidal Thoughts:  No  Homicidal Thoughts:  No  Memory:  Immediate;   Good Recent;   Fair Remote;   Poor  Judgement:  Fair  Insight:  Lacking  Psychomotor Activity:  Decreased  Concentration:  Concentration: Fair and Attention Span: Fair  Recall:  [employer] of Knowledge: Good  Language: Good  Akathisia:  No  Handed:  Right  AIMS (if indicated): not done  Assets:  Communication Skills Desire for Improvement Resilience Social Support Talents/Skills  ADL's:  Intact  Cognition: WNL  Sleep:  Good   Screenings: MDI     Office Visit from 01/22/2016 in [facility]  Total Score (max 50)  34    Mini-Mental     Office Visit from 02/13/2015 in [facility]  Total Score (max 30 points )  26    PHQ2-9     Office Visit from 05/09/2017 in [facility] Office Visit from 09/16/2015 in [facility]  PHQ-2 Total Score  0  4  PHQ-9 Total Score  -  10    SBQ-R     Office Visit from 01/22/2016 in [facility]  SBQ-R Total Score  14.1       Assessment and  Plan: This patient is a 57 year old female with a history of depression and anxiety.  She does not think the Prozac is helping her anymore.  She will discontinue Prozac and start Viibryd at 10 mg for 1 week then 20 mg for 1 week and then advance to 40 mg daily.  She will continue trazodone 100 mg at bedtime for sleep and Xanax 1 mg 3 times daily for anxiety.  She will return to see me in 4 weeks.   [name], MD 02/13/2018, 2:51 PM

## 2018-02-14 ENCOUNTER — Telehealth (HOSPITAL_COMMUNITY): Payer: Self-pay

## 2018-02-14 NOTE — Telephone Encounter (Signed)
It makes no sense to go back to meds that didn't help-tell her to stay on the samples and we will get her a coupon

## 2018-02-14 NOTE — Telephone Encounter (Signed)
I called patient and informed her that we are working on obtain her a coupon for the medication

## 2018-02-14 NOTE — Telephone Encounter (Signed)
Patient called and said that the pharmacy said that Viibryd is going to cost $214 per month and she can't afford that per month so she said she will stay on Prozac or go back on Effexor. Please advise

## 2018-02-22 ENCOUNTER — Other Ambulatory Visit (HOSPITAL_COMMUNITY): Payer: Self-pay | Admitting: Psychiatry

## 2018-02-22 ENCOUNTER — Telehealth (HOSPITAL_COMMUNITY): Payer: Self-pay | Admitting: *Deleted

## 2018-02-22 MED ORDER — VILAZODONE HCL 10 MG PO TABS: 10.0000 mg | ORAL_TABLET | Freq: Every day | ORAL | 2 refills | Status: DC

## 2018-02-22 NOTE — Telephone Encounter (Signed)
[name] Patient called stating that she started the starter pack of Viibyrd. And that the 10 mg word well  But the 20 mg caused her to have headaches. Concerned about what she should do? And asked for   coupons

## 2018-02-22 NOTE — Telephone Encounter (Signed)
I will send in the 10 mg. Please find her a coupon

## 2018-02-23 ENCOUNTER — Telehealth (HOSPITAL_COMMUNITY): Payer: Self-pay | Admitting: *Deleted

## 2018-02-23 ENCOUNTER — Other Ambulatory Visit (HOSPITAL_COMMUNITY): Payer: Self-pay | Admitting: Psychiatry

## 2018-02-23 MED ORDER — VILAZODONE HCL 20 MG PO TABS: 20.0000 mg | ORAL_TABLET | Freq: Every day | ORAL | 2 refills | Status: DC

## 2018-02-23 NOTE — Telephone Encounter (Signed)
[name] Patient came by yesterday picker up Coupon card for Viibyrd. Pharmacy & patient couldn't get card to   activate.  She say's she can't afford the medication & that the 20 mg is cheaper than the 10 mg but the  20 mg gives her H/A. Asking what can be done?

## 2018-02-23 NOTE — Telephone Encounter (Signed)
I sent in 20 mg, she can break them in half. Please call drug company to see if they can help with the card

## 2018-03-18 ENCOUNTER — Encounter (HOSPITAL_COMMUNITY): Payer: Self-pay | Admitting: Emergency Medicine

## 2018-03-18 ENCOUNTER — Emergency Department (HOSPITAL_COMMUNITY)
Admission: EM | Admit: 2018-03-18 | Discharge: 2018-03-18 | Disposition: A | Discharge status: Home / Self Care | Payer: 59 | Attending: Emergency Medicine | Admitting: Emergency Medicine

## 2018-03-18 ENCOUNTER — Emergency Department (HOSPITAL_COMMUNITY): Payer: 59

## 2018-03-18 DIAGNOSIS — I10 Essential (primary) hypertension: Secondary | ICD-10-CM | POA: Insufficient documentation

## 2018-03-18 DIAGNOSIS — Z87891 Personal history of nicotine dependence: Secondary | ICD-10-CM | POA: Insufficient documentation

## 2018-03-18 DIAGNOSIS — Z79899 Other long term (current) drug therapy: Secondary | ICD-10-CM | POA: Diagnosis not present

## 2018-03-18 DIAGNOSIS — M79672 Pain in left foot: Secondary | ICD-10-CM | POA: Diagnosis present

## 2018-03-18 DIAGNOSIS — E785 Hyperlipidemia, unspecified: Secondary | ICD-10-CM | POA: Insufficient documentation

## 2018-03-18 DIAGNOSIS — Z8673 Personal history of transient ischemic attack (TIA), and cerebral infarction without residual deficits: Secondary | ICD-10-CM | POA: Diagnosis not present

## 2018-03-18 MED ORDER — DICLOFENAC SODIUM 1 % TD GEL: 2.0000 g | Freq: Four times a day (QID) | TRANSDERMAL | 0 refills | Status: DC

## 2018-03-18 NOTE — ED Triage Notes (Signed)
Pt reports left foot pain after walking long distance in uncomfortable shoes 2 weeks ago.  Has been walking a mile since then.

## 2018-03-18 NOTE — Discharge Instructions (Signed)
As discussed your xrays are normal.  I suspect your pain may be from overuse or from inflammation and strain of the joints in your foot.  Apply a heating pad 20 minutes 3-4 times daily.  Use the voltaren gel - this is an anti inflammatory but will not affect your stomach.  Call Dr. Aline Brochure for further management if your symptoms do not improve.

## 2018-03-19 ENCOUNTER — Ambulatory Visit (HOSPITAL_COMMUNITY): Payer: 59 | Admitting: Psychiatry

## 2018-03-19 ENCOUNTER — Encounter (HOSPITAL_COMMUNITY): Payer: Self-pay | Admitting: Psychiatry

## 2018-03-19 VITALS — BP 138/86 | HR 71 | Ht 63.0 in | Wt 151.0 lb

## 2018-03-19 DIAGNOSIS — Z813 Family history of other psychoactive substance abuse and dependence: Secondary | ICD-10-CM

## 2018-03-19 DIAGNOSIS — F332 Major depressive disorder, recurrent severe without psychotic features: Secondary | ICD-10-CM

## 2018-03-19 DIAGNOSIS — Z560 Unemployment, unspecified: Secondary | ICD-10-CM

## 2018-03-19 DIAGNOSIS — Z818 Family history of other mental and behavioral disorders: Secondary | ICD-10-CM

## 2018-03-19 DIAGNOSIS — F419 Anxiety disorder, unspecified: Secondary | ICD-10-CM

## 2018-03-19 DIAGNOSIS — Z87891 Personal history of nicotine dependence: Secondary | ICD-10-CM

## 2018-03-19 MED ORDER — VILAZODONE HCL 10 MG PO TABS: 10.0000 mg | ORAL_TABLET | Freq: Every day | ORAL | 2 refills | Status: DC

## 2018-03-19 MED ORDER — ALPRAZOLAM 1 MG PO TABS: 1.0000 mg | ORAL_TABLET | Freq: Three times a day (TID) | ORAL | 2 refills | Status: DC

## 2018-03-19 MED ORDER — TRAZODONE HCL 100 MG PO TABS: 100.0000 mg | ORAL_TABLET | Freq: Every day | ORAL | 2 refills | Status: DC

## 2018-03-19 NOTE — ED Provider Notes (Signed)
[facility] EMERGENCY DEPARTMENT Provider Note   CSN: [id number] Arrival date & time: 03/18/18  1205     History   Chief Complaint Chief Complaint  Patient presents with  . Foot Pain    HPI [name] is a 57 y.o. adult presenting left foot pain across her proximal toes and distal foot which is worsened with weight bearing and with toe flex/ext.  She denies any specific injury but states she has been trying to be more active with walking at nature trails at a local park.  She denies specific injury but does endorse walking on gravel and initially thought she may have a "stone bruise" but it has not improved with rest, ice and ibuprofen which she really does not like to take because of her h/o GERD.  She has found no alleviators.  She denies new weakness or numbness in the foot or toes.   The history is provided by the patient and the spouse.    Past Medical History:  Diagnosis Date  . Allergy    grass, dust , mold  . Anxiety   . Arthritis   . Bipolar disorder ([place])   . Cancer (HCC)    cervical cancer  . Carpal tunnel syndrome    Bilateral  . Chest pain 09/2011   Cardiac cath-normal coronaries  . Constipation   . Depression   . Difficulty urinating 05/31/2013  . Elevated LFTs 12/16/2013  . GERD (gastroesophageal reflux disease)   . History of kidney stones   . Hyperlipemia   . Hyperlipidemia   . Hypertension    Mild; provoked by stress and anxiety  . IBS (irritable bowel syndrome)   . Intracerebral bleed ([place])    No aneurysm; followed by Dr. Sherwood Gambler  . Loss of weight 01/06/2015  . Osteoporosis   . Stroke [facility]) 1999   hemorrhagic stroke; weakness of left side    Patient Active Problem List   Diagnosis Date Noted  . S/P right knee arthroscopy 07/13/17 07/20/2017  . Derangement of posterior horn of medial meniscus of right knee   . Chondromalacia, patella, right   . IBS (irritable bowel syndrome) 05/11/2015  . Gait instability 01/06/2015  . Memory changes  01/06/2015  . Dyspareunia, female 12/11/2014  . DDD (degenerative disc disease), lumbar 02/18/2014  . s/p hemorrhagic CVA (cerebral infarction) from "leaking BV" 11/01/2013  . Postmenopausal atrophic vaginitis 11/01/2013  . Lipoma of abdominal wall 08/14/2013  . Back pain 05/29/2013  . Paresthesia of foot 02/12/2013  . Elevated blood sugar 02/12/2013  . Hypertension   . GERD (gastroesophageal reflux disease)   . Depression 11/17/2011  . Insomnia 11/17/2011  . Hyperlipidemia 09/20/2011  . Anxiety 09/20/2011    Past Surgical History:  Procedure Laterality Date  . ABDOMINAL HYSTERECTOMY     "cancer cells"  . [place]   to remove blood clot after stroke   . CARDIAC CATHETERIZATION  2016  . CERVICAL FUSION    . CHOLECYSTECTOMY N/A 10/14/2014   Procedure: LAPAROSCOPIC CHOLECYSTECTOMY WITH INTRAOPERATIVE CHOLANGIOGRAM;  Surgeon: Jackolyn Confer, MD;  Location: [place];  Service: General;  Laterality: N/A;  . CHONDROPLASTY Right 07/13/2017   Procedure: CHONDROPLASTY of patella;  Surgeon: Carole Civil, MD;  [facility];  Service: Orthopedics;  Laterality: Right;  . EUS N/A 08/21/2015   Procedure: ESOPHAGEAL ENDOSCOPIC ULTRASOUND (EUS) RADIAL;  Surgeon: Carol Ada, MD;  [facility];  Service: Endoscopy;  Laterality: N/A;  . KNEE ARTHROSCOPY WITH MEDIAL MENISECTOMY Right  07/13/2017   Procedure: KNEE ARTHROSCOPY WITH PARTIAL MEDIAL MENISECTOMY;  Surgeon: Carole Civil, MD;  Location: [facility];  Service: Orthopedics;  Laterality: Right;  . LEFT HEART CATHETERIZATION WITH CORONARY ANGIOGRAM N/A 09/23/2011   Procedure: LEFT HEART CATHETERIZATION WITH CORONARY ANGIOGRAM;  Surgeon: Thayer Headings, MD;  Location: [facility] CATH LAB;  Service: Cardiovascular;  Laterality: N/A;  . LIPOMA EXCISION Left 11/18/2013   Procedure: EXCISION OF SOFT TISSUE MASS-LEFT THIGH;  Surgeon: Jamesetta So, MD;  Location: [facility];  Service: General;  Laterality: Left;  . RECTOCELE REPAIR       x2  . RECTOCELE REPAIR N/A 04/04/2017   Procedure: POSTERIOR REPAIR (RECTOCELE);  Surgeon: Jonnie Kind, MD;  [facility];  Service: Gynecology;  Laterality: N/A;     OB History    Gravida  0   Para  0   Term  0   Preterm  0   AB  0   Living  0     SAB  0   TAB  0   Ectopic  0   Multiple  0   Live Births  0            Home Medications    Prior to Admission medications   Medication Sig Start Date End Date Taking? Authorizing Provider  acetaminophen (TYLENOL) 500 MG tablet Take 1,000 mg by mouth every 6 (six) hours as needed for moderate pain.     [provider]  alendronate (FOSAMAX) 10 MG tablet  04/24/17   [provider]  ALPRAZolam Duanne Moron) 1 MG tablet Take 1 tablet (1 mg total) by mouth 3 (three) times daily. 02/13/18   Cloria Spring, MD  Ascorbic Acid (VITAMIN C) 1000 MG tablet Take 1,000 mg by mouth daily.    [provider]  B Complex-C (SUPER B COMPLEX PO) Take 1 tablet by mouth daily.     [provider]  cetirizine (ZYRTEC) 10 MG tablet Take 10 mg by mouth daily as needed for allergies.     [provider]  cycloSPORINE (RESTASIS) 0.05 % ophthalmic emulsion Place 1 drop into both eyes 2 (two) times daily.     [provider]  diclofenac sodium (VOLTAREN) 1 % GEL Apply 2 g topically 4 (four) times daily. 03/18/18   Evalee Jefferson, PA-C  HYDROcodone-acetaminophen (NORCO/VICODIN) 5-325 MG tablet Take 1 tablet by mouth every 6 (six) hours as needed for moderate pain. 09/12/17   Carole Civil, MD  lisinopril (PRINIVIL,ZESTRIL) 20 MG tablet Take 20 mg by mouth daily.  07/08/16   [provider]  Magnesium 250 MG TABS Take 250 mg by mouth daily.     [provider]  Multiple Vitamin (MULITIVITAMIN WITH MINERALS) TABS Take 1 tablet by mouth daily.    [provider]  nystatin (NYSTATIN) powder Apply 1 g topically daily.    [provider]  pantoprazole (PROTONIX)  40 MG tablet Take 40 mg by mouth daily.     [provider]  polyethylene glycol powder (GLYCOLAX/MIRALAX) powder Take 17 g by mouth daily. 02/18/14   Alycia Rossetti, MD  Potassium 99 MG TABS Take 99 mg by mouth daily.     [provider]  simvastatin (ZOCOR) 20 MG tablet TAKE ONE TABLET BY MOUTH ONCE DAILY AT BEDTIME Patient taking differently: TAKE 20 MG BY MOUTH ONCE DAILY AT BEDTIME 05/14/15   Wilmore, Modena Nunnery, MD  traZODone (DESYREL) 100 MG tablet Take 1 tablet (100  mg total) by mouth at bedtime. 02/13/18   Cloria Spring, MD  Vilazodone HCl (VIIBRYD) 20 MG TABS Take 1 tablet (20 mg total) by mouth daily. 02/23/18   Cloria Spring, MD    Family History Family History  Problem Relation Age of Onset  . Cancer Mother        breast   . Hypertension Mother   . Hyperlipidemia Mother   . Depression Mother   . Anxiety disorder Mother   . COPD Mother   . Arthritis Mother        rheumatoid  . Drug abuse Sister   . Coronary artery disease Paternal Grandfather   . Coronary artery disease Paternal Uncle   . Depression Cousin   . Drug abuse Cousin     Social History Social History   Tobacco Use  . Smoking status: Former Smoker    Packs/day: 1.00    Years: 19.00    Pack years: 19.00    Types: Cigarettes    Last attempt to quit: 09/01/1997    Years since quitting: 20.5  . Smokeless tobacco: Never Used  . Tobacco comment: Quit smoking 1999 , previous 20 pack years  Substance Use Topics  . Alcohol use: Yes    Comment: 1 drink every other week  . Drug use: No     Allergies   Morphine and related and Promethazine hcl   Review of Systems Review of Systems  Constitutional: Negative for fever.  Musculoskeletal: Positive for arthralgias. Negative for joint swelling and myalgias.  Neurological: Negative for weakness and numbness.     Physical Exam Updated Vital Signs BP 139/83 (BP Location: Right Arm)   Pulse 64   Temp 97.8 F (36.6 C) (Oral)   Resp 18    Ht 5' 3"  (1.6 m)   Wt 68 kg   SpO2 98%   BMI 26.57 kg/m   Physical Exam  Constitutional: She appears well-developed and well-nourished.  HENT:  Head: Atraumatic.  Neck: Normal range of motion.  Cardiovascular:  Pulses equal bilaterally  Musculoskeletal: She exhibits tenderness. She exhibits no deformity.       Feet:  Pt is ttp across distal mtp joints of the left foot, minimally involving the great mtp joint. No erythema or edema.  No rash, lesions, bruising.  Dorsalis pedal pulse intact with less than 2 sec cap refill.  Neurological: She is alert. She has normal strength. She displays normal reflexes. No sensory deficit.  Skin: Skin is warm and dry.  Psychiatric: She has a normal mood and affect.     ED Treatments / Results  Labs (all labs ordered are listed, but only abnormal results are displayed) Labs Reviewed - No data to display  EKG None  Radiology Dg Foot Complete Left  Result Date: 03/18/2018 CLINICAL DATA:  LEFT foot pain. EXAM: LEFT FOOT - COMPLETE 3+ VIEW COMPARISON:  None. FINDINGS: There is no evidence of fracture or dislocation. There is no evidence of arthropathy or other focal bone abnormality. Soft tissues are unremarkable. IMPRESSION: Negative. Electronically Signed   By: Franki Cabot M.D.   On: 03/18/2018 13:36    Procedures Procedures (including critical care time)  Medications Ordered in ED Medications - No data to display   Initial Impression / Assessment and Plan / ED Course  I have reviewed the triage vital signs and the nursing notes.  Pertinent labs & imaging results that were available during my care of the patient were reviewed by me and considered  in my medical decision making (see chart for details).     Imaging reviewed and negative for acute bony injury.  Suspect deep contusion or joint strain.  Discussed RICE, pt placed in post op shoe, watson jones wrap given for increased cushion. voltaren gel. F/u with Dr.Harrison  prn.  Final Clinical Impressions(s) / ED Diagnoses   Final diagnoses:  Foot pain, left    ED Discharge Orders         Ordered    diclofenac sodium (VOLTAREN) 1 % GEL  4 times daily     03/18/18 1422           Evalee Jefferson, PA-C 03/19/18 5093    Milton Ferguson, MD 03/19/18 1442

## 2018-03-19 NOTE — Progress Notes (Signed)
[place] MD/PA/NP OP Progress Note  03/19/2018 11:08 AM TIANE SZYDLOWSKI  MRN:  [id number]  Chief Complaint:  Chief Complaint    Depression; Anxiety; Follow-up     HPI:T his patient is a 57 year old married white female who lives with her husband in [place]. She has no children. 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  The patient was referred by her primary physician, Dr. [name], for further assessment and treatment of depression anxiety and focus problems.  The patient states that she did well most of her life until she had a stroke in 28 at age 57. She had a cerebral aneurysm that burst and she had to have a hematoma evacuated from the right frontal lobe. She has had resulting difficulties ever since and still has weakness on the left side of her body and poor fine motor skills in her left hand. She is right handed. She states that she gets older her symptoms worsen.  The patient often feels anxious particular in crowds. She has frequent panic attacks. She takes Xanax 1 mg twice a day but is reluctant to take more. Shortly after she had her stroke she saw psychiatrist in [place] and was placed on the Xanax. Dr. [name] later put her on Effexor and she is now up to 150 mg per day. [name] not sure if it's helping. She has difficulty sleeping but trazodone helps to some degree. She also is significant problems with focus and attention span. Her thoughts ramble. She'll start one thing and go the next. She can't complete tasks. She finds this extremely frustrating. Her mood is labile at times and she'll get angry quickly and for no reason. She denies auditory or visual hallucinations or paranoia. She does not use drugs and very rarely takes a drink. She admits that time she has passive suicidal ideation but would never hurt her self because of her faith  The patient returns after 1 month.  Last time we started Viibryd and she  is taking 10 mg daily.  She tried 20 but she actually feels better on the 10.  Her energy has improved and she has been getting out and walking every day.  The downside is that she has strained some tendons in her left foot and now is in a boot.  Her mood is much improved and she seems brighter and more cheerful.  She is sleeping well.  The Xanax continues to help her anxiety. Visit Diagnosis:    ICD-10-CM   1. Major depressive disorder, recurrent, severe without psychotic features ([place]) F33.2     Past Psychiatric History: Past outpatient treatment for depression  Past Medical History:  Past Medical History:  Diagnosis Date  . Allergy    grass, dust , mold  . Anxiety   . Arthritis   . Bipolar disorder ([place])   . Cancer (HCC)    cervical cancer  . Carpal tunnel syndrome    Bilateral  . Chest pain 09/2011   Cardiac cath-normal coronaries  . Constipation   . Depression   . Difficulty urinating 05/31/2013  . Elevated LFTs 12/16/2013  . GERD (gastroesophageal reflux disease)   . History of kidney stones   . Hyperlipemia   . Hyperlipidemia   . Hypertension    Mild; provoked by stress and anxiety  . IBS (irritable bowel syndrome)   . Intracerebral bleed ([place])    No aneurysm; followed by Dr. Sherwood Gambler  .  Loss of weight 01/06/2015  . Osteoporosis   . Stroke [facility]) 1999   hemorrhagic stroke; weakness of left side    Past Surgical History:  Procedure Laterality Date  . ABDOMINAL HYSTERECTOMY     "cancer cells"  . [place]   to remove blood clot after stroke   . CARDIAC CATHETERIZATION  2016  . CERVICAL FUSION    . CHOLECYSTECTOMY N/A 10/14/2014   Procedure: LAPAROSCOPIC CHOLECYSTECTOMY WITH INTRAOPERATIVE CHOLANGIOGRAM;  Surgeon: Jackolyn Confer, MD;  Location: [place];  Service: General;  Laterality: N/A;  . CHONDROPLASTY Right 07/13/2017   Procedure: CHONDROPLASTY of patella;  Surgeon: Carole Civil, MD;  [facility];  Service: Orthopedics;  Laterality: Right;  .  EUS N/A 08/21/2015   Procedure: ESOPHAGEAL ENDOSCOPIC ULTRASOUND (EUS) RADIAL;  Surgeon: Carol Ada, MD;  [facility];  Service: Endoscopy;  Laterality: N/A;  . KNEE ARTHROSCOPY WITH MEDIAL MENISECTOMY Right 07/13/2017   Procedure: KNEE ARTHROSCOPY WITH PARTIAL MEDIAL MENISECTOMY;  Surgeon: Carole Civil, MD;  [facility];  Service: Orthopedics;  Laterality: Right;  . LEFT HEART CATHETERIZATION WITH CORONARY ANGIOGRAM N/A 09/23/2011   Procedure: LEFT HEART CATHETERIZATION WITH CORONARY ANGIOGRAM;  Surgeon: Thayer Headings, MD;  Location: [facility] C[facility] LAB;  Service: Cardiovascular;  Laterality: N/A;  . LIPOMA EXCISION Left 11/18/2013   Procedure: EXCISION OF SOFT TISSUE MASS-LEFT THIGH;  Surgeon: Jamesetta So, MD;  [facility];  Service: General;  Laterality: Left;  . RECTOCELE REPAIR     x2  . RECTOCELE REPAIR N/A 04/04/2017   Procedure: POSTERIOR REPAIR (RECTOCELE);  Surgeon: Jonnie Kind, MD;  [facility];  Service: Gynecology;  Laterality: N/A;    Family Psychiatric History: See below  Family History:  Family History  Problem Relation Age of Onset  . Cancer Mother        breast   . Hypertension Mother   . Hyperlipidemia Mother   . Depression Mother   . Anxiety disorder Mother   . COPD Mother   . Arthritis Mother        rheumatoid  . Drug abuse Sister   . Coronary artery disease Paternal Grandfather   . Coronary artery disease Paternal Uncle   . Depression Cousin   . Drug abuse Cousin     Social History:  Social History   Socioeconomic History  . Marital status: Married    Spouse name: Sonia Side  . Number of children: 0  . Years of education: HS  . Highest education level: Not on file  Occupational History  . Occupation: unemployed    Comment: pending disability  Social Needs  . Financial resource strain: Not hard at all  . Food insecurity:    Worry: Never true    Inability: Never true  . Transportation needs:    Medical: No     Non-medical: No  Tobacco Use  . Smoking status: Former Smoker    Packs/day: 1.00    Years: 19.00    Pack years: 19.00    Types: Cigarettes    Last attempt to quit: 09/01/1997    Years since quitting: 20.5  . Smokeless tobacco: Never Used  . Tobacco comment: Quit smoking 1999 , previous 20 pack years  Substance and Sexual Activity  . Alcohol use: Yes    Comment: 1 drink every other week  . Drug use: No  . Sexual activity: Not Currently    Partners: Male    Birth control/protection: Surgical  Lifestyle  .  Physical activity:    Days per week: 2 days    Minutes per session: 30 min  . Stress: Very much  Relationships  . Social connections:    Talks on phone: More than three times a week    Gets together: More than three times a week    Attends religious service: More than 4 times per year    Active member of club or organization: No    Attends meetings of clubs or organizations: Never    Relationship status: Married  Other Topics Concern  . Not on file  Social History Narrative   Currently unable to work   Lives in [place]   Married   Patient drinks 1 cup of caffeine daily.   Patient is right handed.    [employer]ined the [employer] to get more exercise    Allergies:  Allergies  Allergen Reactions  . Morphine And Related Hives  . Promethazine Hcl Other (See Comments)    Causes patient to become Hyper    Metabolic Disorder Labs: Lab Results  Component Value Date   HGBA1C 5.3 06/02/2017   MPG 117 (H) 01/06/2015   MPG 117 (H) 02/17/2014   No results found for: PROLACTIN Lab Results  Component Value Date   CHOL 206 (H) 06/02/2017   TRIG 141 06/02/2017   HDL 76 06/02/2017   CHOLHDL 2.3 09/16/2015   VLDL 26 09/16/2015   LDLCALC 102 (H) 06/02/2017   LDLCALC 69 09/16/2015   Lab Results  Component Value Date   TSH 1.438 09/20/2011    Therapeutic Level Labs: No results found for: LITHIUM No results found for: VALPROATE No components found for:  CBMZ  Current  Medications: Current Outpatient Medications  Medication Sig Dispense Refill  . acetaminophen (TYLENOL) 500 MG tablet Take 1,000 mg by mouth every 6 (six) hours as needed for moderate pain.     Marland Kitchen alendronate (FOSAMAX) 10 MG tablet     . ALPRAZolam (XANAX) 1 MG tablet Take 1 tablet (1 mg total) by mouth 3 (three) times daily. 90 tablet 2  . Ascorbic Acid (VITAMIN C) 1000 MG tablet Take 1,000 mg by mouth daily.    . B Complex-C (SUPER B COMPLEX PO) Take 1 tablet by mouth daily.     . cetirizine (ZYRTEC) 10 MG tablet Take 10 mg by mouth daily as needed for allergies.     . cycloSPORINE (RESTASIS) 0.05 % ophthalmic emulsion Place 1 drop into both eyes 2 (two) times daily.     . diclofenac sodium (VOLTAREN) 1 % GEL Apply 2 g topically 4 (four) times daily. 100 g 0  . HYDROcodone-acetaminophen (NORCO/VICODIN) 5-325 MG tablet Take 1 tablet by mouth every 6 (six) hours as needed for moderate pain. 10 tablet 0  . lisinopril (PRINIVIL,ZESTRIL) 20 MG tablet Take 20 mg by mouth daily.     . Magnesium 250 MG TABS Take 250 mg by mouth daily.     . Multiple Vitamin (MULITIVITAMIN WITH MINERALS) TABS Take 1 tablet by mouth daily.    Marland Kitchen nystatin (NYSTATIN) powder Apply 1 g topically daily.    . pantoprazole (PROTONIX) 40 MG tablet Take 40 mg by mouth daily.     . polyethylene glycol powder (GLYCOLAX/MIRALAX) powder Take 17 g by mouth daily. 3350 g 6  . Potassium 99 MG TABS Take 99 mg by mouth daily.     . simvastatin (ZOCOR) 20 MG tablet TAKE ONE TABLET BY MOUTH ONCE DAILY AT BEDTIME (Patient taking differently: TAKE 20 MG  BY MOUTH ONCE DAILY AT BEDTIME) 30 tablet 6  . traZODone (DESYREL) 100 MG tablet Take 1 tablet (100 mg total) by mouth at bedtime. 90 tablet 2  . Vilazodone HCl (VIIBRYD) 10 MG TABS Take 1 tablet (10 mg total) by mouth daily. 30 tablet 2   No current facility-administered medications for this visit.      Musculoskeletal: Strength & Muscle Tone: within normal limits Gait & Station:  unsteady Patient leans: N/A  Psychiatric Specialty Exam: Review of Systems  Musculoskeletal: Positive for joint pain.  All other systems reviewed and are negative.   Blood pressure 138/86, pulse 71, height 5' 3"  (1.6 m), weight 151 lb (68.5 kg), SpO2 97 %.Body mass index is 26.75 kg/m.  General Appearance: Casual, Neat and Well Groomed  Eye Contact:  Good  Speech:  Clear and Coherent  Volume:  Normal  Mood:  Euthymic  Affect:  Congruent  Thought Process:  Goal Directed  Orientation:  Full (Time, Place, and Person)  Thought Content: WDL   Suicidal Thoughts:  No  Homicidal Thoughts:  No  Memory:  Immediate;   Good Recent;   Good Remote;   Good  Judgement:  Good  Insight:  Fair  Psychomotor Activity:  Normal  Concentration:  Concentration: Good and Attention Span: Good  Recall:  Good  Fund of Knowledge: Good  Language: Good  Akathisia:  No  Handed:  Right  AIMS (if indicated): not done  Assets:  Communication Skills Desire for Improvement Resilience Social Support Talents/Skills  ADL's:  Intact  Cognition: WNL  Sleep:  Good   Screenings: MDI     Office Visit from 01/22/2016 in [name]  Total Score (max 50)  34    Mini-Mental     Office Visit from 02/13/2015 in [facility]  Total Score (max 30 points )  26    PHQ2-9     Office Visit from 05/09/2017 in [facility] Office Visit from 09/16/2015 in [place]  PHQ-2 Total Score  0  4  PHQ-9 Total Score  -  10    SBQ-R     Office Visit from 01/22/2016 in [facility]  SBQ-R Total Score  14.1       Assessment and Plan: This patient is a 57 year old female with a history of depression and anxiety.  She seems to be doing well on her current regimen.  She will continue Viibryd 10 mg daily for depression, trazodone 100 mg at bedtime for sleep and Xanax 1 mg 3 times daily for anxiety.   She will return to see me in 3 months   [name], MD 03/19/2018, 11:08 AM

## 2018-05-02 ENCOUNTER — Ambulatory Visit: Payer: 59 | Admitting: Orthopedic Surgery

## 2018-05-02 ENCOUNTER — Encounter: Payer: Self-pay | Admitting: Orthopedic Surgery

## 2018-05-02 VITALS — BP 110/90 | HR 85 | Ht 63.0 in | Wt 154.0 lb

## 2018-05-02 DIAGNOSIS — M7742 Metatarsalgia, left foot: Secondary | ICD-10-CM

## 2018-05-02 MED ORDER — PREDNISONE 10 MG PO TABS: 10.0000 mg | ORAL_TABLET | Freq: Two times a day (BID) | ORAL | 0 refills | Status: DC

## 2018-05-02 NOTE — Progress Notes (Signed)
NEW PROBLEMOFFICE VISIT  Chief Complaint  Patient presents with  . Foot Pain    left foot pain with walking has felt bruised     57 years old new problem today pain in the left foot.  This started on September 15 after patient did a lot of walking she started having some pain in the second and third metatarsophalangeal joints this was associated with swelling the foot became bruised tender unrelieved by Voltaren gel and she is had trouble walking and toeing off on that side since   Review of Systems  Constitutional: Negative for fever.  Musculoskeletal: Positive for joint pain.  Skin: Negative.   Neurological: Positive for tingling.     Past Medical History:  Diagnosis Date  . Allergy    grass, dust , mold  . Anxiety   . Arthritis   . Bipolar disorder ([place])   . Cancer (HCC)    cervical cancer  . Carpal tunnel syndrome    Bilateral  . Chest pain 09/2011   Cardiac cath-normal coronaries  . Constipation   . Depression   . Difficulty urinating 05/31/2013  . Elevated LFTs 12/16/2013  . GERD (gastroesophageal reflux disease)   . History of kidney stones   . Hyperlipemia   . Hyperlipidemia   . Hypertension    Mild; provoked by stress and anxiety  . IBS (irritable bowel syndrome)   . Intracerebral bleed ([place])    No aneurysm; followed by Dr. Sherwood Gambler  . Loss of weight 01/06/2015  . Osteoporosis   . Stroke [facility]) 1999   hemorrhagic stroke; weakness of left side    Past Surgical History:  Procedure Laterality Date  . ABDOMINAL HYSTERECTOMY     "cancer cells"  . [place]   to remove blood clot after stroke   . CARDIAC CATHETERIZATION  2016  . CERVICAL FUSION    . CHOLECYSTECTOMY N/A 10/14/2014   Procedure: LAPAROSCOPIC CHOLECYSTECTOMY WITH INTRAOPERATIVE CHOLANGIOGRAM;  Surgeon: Jackolyn Confer, MD;  Location: [place];  Service: General;  Laterality: N/A;  . CHONDROPLASTY Right 07/13/2017   Procedure: CHONDROPLASTY of patella;  Surgeon: Carole Civil, MD;   [facility];  Service: Orthopedics;  Laterality: Right;  . EUS N/A 08/21/2015   Procedure: ESOPHAGEAL ENDOSCOPIC ULTRASOUND (EUS) RADIAL;  Surgeon: Carol Ada, MD;  [facility];  Service: Endoscopy;  Laterality: N/A;  . KNEE ARTHROSCOPY WITH MEDIAL MENISECTOMY Right 07/13/2017   Procedure: KNEE ARTHROSCOPY WITH PARTIAL MEDIAL MENISECTOMY;  Surgeon: Carole Civil, MD;  [facility];  Service: Orthopedics;  Laterality: Right;  . LEFT HEART CATHETERIZATION WITH CORONARY ANGIOGRAM N/A 09/23/2011   Procedure: LEFT HEART CATHETERIZATION WITH CORONARY ANGIOGRAM;  Surgeon: Thayer Headings, MD;  [facility] C[facility] LAB;  Service: Cardiovascular;  Laterality: N/A;  . LIPOMA EXCISION Left 11/18/2013   Procedure: EXCISION OF SOFT TISSUE MASS-LEFT THIGH;  Surgeon: Jamesetta So, MD;  [facility];  Service: General;  Laterality: Left;  . RECTOCELE REPAIR     x2  . RECTOCELE REPAIR N/A 04/04/2017   Procedure: POSTERIOR REPAIR (RECTOCELE);  Surgeon: Jonnie Kind, MD;  [facility];  Service: Gynecology;  Laterality: N/A;    Family History  Problem Relation Age of Onset  . Cancer Mother        breast   . Hypertension Mother   . Hyperlipidemia Mother   . Depression Mother   . Anxiety disorder Mother   . COPD Mother   . Arthritis Mother  rheumatoid  . Drug abuse Sister   . Coronary artery disease Paternal Grandfather   . Coronary artery disease Paternal Uncle   . Depression Cousin   . Drug abuse Cousin    Social History   Tobacco Use  . Smoking status: Former Smoker    Packs/day: 1.00    Years: 19.00    Pack years: 19.00    Types: Cigarettes    Last attempt to quit: 09/01/1997    Years since quitting: 20.6  . Smokeless tobacco: Never Used  . Tobacco comment: Quit smoking 1999 , previous 20 pack years  Substance Use Topics  . Alcohol use: Yes    Comment: 1 drink every other week  . Drug use: No    Allergies  Allergen Reactions  . Morphine And  Related Hives  . Promethazine Hcl Other (See Comments)    Causes patient to become Hyper    Current Meds  Medication Sig  . acetaminophen (TYLENOL) 500 MG tablet Take 1,000 mg by mouth every 6 (six) hours as needed for moderate pain.   Marland Kitchen alendronate (FOSAMAX) 10 MG tablet   . ALPRAZolam (XANAX) 1 MG tablet Take 1 tablet (1 mg total) by mouth 3 (three) times daily.  . Ascorbic Acid (VITAMIN C) 1000 MG tablet Take 1,000 mg by mouth daily.  . B Complex-C (SUPER B COMPLEX PO) Take 1 tablet by mouth daily.   . cetirizine (ZYRTEC) 10 MG tablet Take 10 mg by mouth daily as needed for allergies.   . cycloSPORINE (RESTASIS) 0.05 % ophthalmic emulsion Place 1 drop into both eyes 2 (two) times daily.   . diclofenac sodium (VOLTAREN) 1 % GEL Apply 2 g topically 4 (four) times daily.  Marland Kitchen HYDROcodone-acetaminophen (NORCO/VICODIN) 5-325 MG tablet Take 1 tablet by mouth every 6 (six) hours as needed for moderate pain.  Marland Kitchen lisinopril (PRINIVIL,ZESTRIL) 20 MG tablet Take 20 mg by mouth daily.   . Magnesium 250 MG TABS Take 250 mg by mouth daily.   . Multiple Vitamin (MULITIVITAMIN WITH MINERALS) TABS Take 1 tablet by mouth daily.  Marland Kitchen nystatin (NYSTATIN) powder Apply 1 g topically daily.  . pantoprazole (PROTONIX) 40 MG tablet Take 40 mg by mouth daily.   . polyethylene glycol powder (GLYCOLAX/MIRALAX) powder Take 17 g by mouth daily.  . Potassium 99 MG TABS Take 99 mg by mouth daily.   . simvastatin (ZOCOR) 40 MG tablet   . traZODone (DESYREL) 100 MG tablet Take 1 tablet (100 mg total) by mouth at bedtime.  . Vilazodone HCl (VIIBRYD) 10 MG TABS Take 1 tablet (10 mg total) by mouth daily.    BP 110/90   Pulse 85   Ht 5' 3"  (1.6 m)   Wt 154 lb (69.9 kg)   BMI 27.28 kg/m   Physical Exam  Ortho Exam    MEDICAL DECISION SECTION  Xrays were done at CLINICAL DATA:  LEFT foot pain.  EXAM: LEFT FOOT - COMPLETE 3+ VIEW  COMPARISON:  None.  FINDINGS: There is no evidence of fracture or  dislocation. There is no evidence of arthropathy or other focal bone abnormality. Soft tissues are unremarkable.  IMPRESSION: Negative.   Electronically Signed   By: Franki Cabot M.D.   On: 03/18/2018 13:36  My independent reading of xrays:  3 views left foot no arthritis is seen alignment is normal.  No acute process noted  Encounter Diagnosis  Name Primary?  . Metatarsalgia of left foot Yes  Second third digit  PLAN: (Rx.,  injectx, surgery, frx, mri/ct) 0RAL STEROIDS   Meds ordered this encounter  Medications  . predniSONE (DELTASONE) 10 MG tablet    Sig: Take 1 tablet (10 mg total) by mouth 2 (two) times daily with a meal.    Dispense:  28 tablet    Refill:  0    Arther Abbott, MD  05/02/2018 3:12 PM

## 2018-05-07 ENCOUNTER — Ambulatory Visit: Payer: 59 | Admitting: Orthopedic Surgery

## 2018-05-07 ENCOUNTER — Encounter: Payer: Self-pay | Admitting: Orthopedic Surgery

## 2018-05-07 VITALS — BP 124/76 | HR 88 | Ht 63.0 in | Wt 155.0 lb

## 2018-05-07 DIAGNOSIS — M7051 Other bursitis of knee, right knee: Secondary | ICD-10-CM | POA: Diagnosis not present

## 2018-05-07 NOTE — Progress Notes (Signed)
Chief Complaint  Patient presents with  . Knee Pain    Right knee DOS 07/13/17    57 years old status post knee arthroscopy history of arthritis and bursitis right knee presents for evaluation of pain medial side of proximal tibia no trauma  Review of systems no catching locking or giving way  BP 124/76   Pulse 88   Ht 5' 3"  (1.6 m)   Wt 155 lb (70.3 kg)   BMI 27.46 kg/m   Physical Exam  Constitutional: She is oriented to person, place, and time. She appears well-developed and well-nourished.  Vital signs have been reviewed and are stable. Gen. appearance the patient is well-developed and well-nourished with normal grooming and hygiene.   Musculoskeletal:       Legs: Neurological: She is alert and oriented to person, place, and time. Gait normal.  Skin: Skin is warm and dry. No erythema.  Psychiatric: She has a normal mood and affect.  Vitals reviewed.  Encounter Diagnosis  Name Primary?  . Pes anserinus bursitis of right knee Yes    Procedure note right knee injection for bursitis  verbal consent was obtained to inject right knee PES BURSA  Timeout was completed to confirm the site of injection  The medications used were 40 mg of Depo-Medrol and 1% lidocaine 3 cc  Anesthesia was provided by ethyl chloride and the skin was prepped with alcohol.  After cleaning the skin with alcohol a 25-gauge needle was used to inject the right knee bursa.  There were no complications and a sterile bandage was applied

## 2018-06-18 ENCOUNTER — Encounter (HOSPITAL_COMMUNITY): Payer: Self-pay | Admitting: Psychiatry

## 2018-06-18 ENCOUNTER — Ambulatory Visit (HOSPITAL_COMMUNITY): Payer: 59 | Admitting: Psychiatry

## 2018-06-18 VITALS — BP 117/82 | HR 88 | Ht 63.0 in | Wt 152.4 lb

## 2018-06-18 DIAGNOSIS — R413 Other amnesia: Secondary | ICD-10-CM

## 2018-06-18 DIAGNOSIS — F332 Major depressive disorder, recurrent severe without psychotic features: Secondary | ICD-10-CM

## 2018-06-18 MED ORDER — ALPRAZOLAM 1 MG PO TABS: 1.0000 mg | ORAL_TABLET | Freq: Three times a day (TID) | ORAL | 2 refills | Status: DC

## 2018-06-18 MED ORDER — TRAZODONE HCL 100 MG PO TABS: 100.0000 mg | ORAL_TABLET | Freq: Every day | ORAL | 2 refills | Status: DC

## 2018-06-18 MED ORDER — VILAZODONE HCL 10 MG PO TABS: 10.0000 mg | ORAL_TABLET | Freq: Every day | ORAL | 2 refills | Status: DC

## 2018-06-18 NOTE — Progress Notes (Signed)
[place] MD/PA/NP OP Progress Note  06/18/2018 1:58 PM [name]  MRN:  [id number]  Chief Complaint:  Chief Complaint    Depression; Anxiety; Follow-up     HPI: T his patient is a 57 year old married white female who lives with her husband in [place]. She has no children. 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  The patient was referred by her primary physician, Dr. [name], for further assessment and treatment of depression anxiety and focus problems.  The patient states that she did well most of her life until she had a stroke in 69 at age 89. She had a cerebral aneurysm that burst and she had to have a hematoma evacuated from the right frontal lobe. She has had resulting difficulties ever since and still has weakness on the left side of her body and poor fine motor skills in her left hand. She is right handed. She states that she gets older her symptoms worsen.  The patient often feels anxious particular in crowds. She has frequent panic attacks. She takes Xanax 1 mg twice a day but is reluctant to take more. Shortly after she had her stroke she saw psychiatrist in [place] and was placed on the Xanax. Dr. [name] later put her on Effexor and she is now up to 150 mg per day. [name] not sure if it's helping. She has difficulty sleeping but trazodone helps to some degree. She also is significant problems with focus and attention span. Her thoughts ramble. She'll start one thing and go the next. She can't complete tasks. She finds this extremely frustrating. Her mood is labile at times and she'll get angry quickly and for no reason. She denies auditory or visual hallucinations or paranoia. She does not use drugs and very rarely takes a drink. She admits that time she has passive suicidal ideation but would never hurt her self because of her faith  The patient returns for follow-up after 3 months.  Last time she states  that she was doing very well on Viibryd 10 mg.  However she claims she stopped 30 days ago or so because of the cost.  She states that her co-pay is $100 a month.  Since then she has gone on decline.  She is depressed all the time having crying spells and passive suicidal ideation.  She is not sleeping well and has no energy during the day.  She denies any plan to actually harm herself.  I explained that she was actually doing well on the medication it made no sense for her to stop.  I told her we have samples here and if she cannot afford it we will find a way to get her to stay on it.  She agrees to go back on it since it works well for her.  She states that her thoughts are very repetitive and she cannot stop worrying. Visit Diagnosis:    ICD-10-CM   1. Major depressive disorder, recurrent, severe without psychotic features ([place]) F33.2   2. Memory deficit R41.3     Past Psychiatric History: Past outpatient treatment for depression  Past Medical History:  Past Medical History:  Diagnosis Date  . Allergy    grass, dust , mold  . Anxiety   . Arthritis   . Bipolar disorder ([place])   . Cancer (HCC)    cervical cancer  . Carpal tunnel syndrome    Bilateral  . Chest  pain 09/2011   Cardiac cath-normal coronaries  . Constipation   . Depression   . Difficulty urinating 05/31/2013  . Elevated LFTs 12/16/2013  . GERD (gastroesophageal reflux disease)   . History of kidney stones   . Hyperlipemia   . Hyperlipidemia   . Hypertension    Mild; provoked by stress and anxiety  . IBS (irritable bowel syndrome)   . Intracerebral bleed ([place])    No aneurysm; followed by Dr. Sherwood Gambler  . Loss of weight 01/06/2015  . Osteoporosis   . Stroke [facility]) 1999   hemorrhagic stroke; weakness of left side    Past Surgical History:  Procedure Laterality Date  . ABDOMINAL HYSTERECTOMY     "cancer cells"  . [place]   to remove blood clot after stroke   . CARDIAC CATHETERIZATION  2016  . CERVICAL  FUSION    . CHOLECYSTECTOMY N/A 10/14/2014   Procedure: LAPAROSCOPIC CHOLECYSTECTOMY WITH INTRAOPERATIVE CHOLANGIOGRAM;  Surgeon: Jackolyn Confer, MD;  Location: [facility];  Service: General;  Laterality: N/A;  . CHONDROPLASTY Right 07/13/2017   Procedure: CHONDROPLASTY of patella;  Surgeon: Carole Civil, MD;  Location: [facility];  Service: Orthopedics;  Laterality: Right;  . EUS N/A 08/21/2015   Procedure: ESOPHAGEAL ENDOSCOPIC ULTRASOUND (EUS) RADIAL;  Surgeon: Carol Ada, MD;  Location: [facility];  Service: Endoscopy;  Laterality: N/A;  . KNEE ARTHROSCOPY WITH MEDIAL MENISECTOMY Right 07/13/2017   Procedure: KNEE ARTHROSCOPY WITH PARTIAL MEDIAL MENISECTOMY;  Surgeon: Carole Civil, MD;  Location: [facility];  Service: Orthopedics;  Laterality: Right;  . LEFT HEART CATHETERIZATION WITH CORONARY ANGIOGRAM N/A 09/23/2011   Procedure: LEFT HEART CATHETERIZATION WITH CORONARY ANGIOGRAM;  Surgeon: Thayer Headings, MD;  Location: [facility] CATH LAB;  Service: Cardiovascular;  Laterality: N/A;  . LIPOMA EXCISION Left 11/18/2013   Procedure: EXCISION OF SOFT TISSUE MASS-LEFT THIGH;  Surgeon: Jamesetta So, MD;  Location: [facility];  Service: General;  Laterality: Left;  . RECTOCELE REPAIR     x2  . RECTOCELE REPAIR N/A 04/04/2017   Procedure: POSTERIOR REPAIR (RECTOCELE);  Surgeon: Jonnie Kind, MD;  Location: [facility];  Service: Gynecology;  Laterality: N/A;    Family Psychiatric History: See below  Family History:  Family History  Problem Relation Age of Onset  . Cancer Mother        breast   . Hypertension Mother   . Hyperlipidemia Mother   . Depression Mother   . Anxiety disorder Mother   . COPD Mother   . Arthritis Mother        rheumatoid  . Drug abuse Sister   . Coronary artery disease Paternal Grandfather   . Coronary artery disease Paternal Uncle   . Depression Cousin   . Drug abuse Cousin     Social History:  Social History   Socioeconomic History  . Marital status: Married     Spouse name: Sonia Side  . Number of children: 0  . Years of education: HS  . Highest education level: Not on file  Occupational History  . Occupation: unemployed    Comment: pending disability  Social Needs  . Financial resource strain: Not hard at all  . Food insecurity:    Worry: Never true    Inability: Never true  . Transportation needs:    Medical: No    Non-medical: No  Tobacco Use  . Smoking status: Former Smoker    Packs/day: 1.00    Years: 19.00    Pack years: 19.00  Types: Cigarettes    Last attempt to quit: 09/01/1997    Years since quitting: 20.8  . Smokeless tobacco: Never Used  . Tobacco comment: Quit smoking 1999 , previous 20 pack years  Substance and Sexual Activity  . Alcohol use: Yes    Comment: 1 drink every other week  . Drug use: No  . Sexual activity: Not Currently    Partners: Male    Birth control/protection: Surgical  Lifestyle  . Physical activity:    Days per week: 2 days    Minutes per session: 30 min  . Stress: Very much  Relationships  . Social connections:    Talks on phone: More than three times a week    Gets together: More than three times a week    Attends religious service: More than 4 times per year    Active member of club or organization: No    Attends meetings of clubs or organizations: Never    Relationship status: Married  Other Topics Concern  . Not on file  Social History Narrative   Currently unable to work   Lives in [place]   Married   Patient drinks 1 cup of caffeine daily.   Patient is right handed.    [employer]ined the [employer] to get more exercise    Allergies:  Allergies  Allergen Reactions  . Morphine And Related Hives  . Promethazine Hcl Other (See Comments)    Causes patient to become Hyper    Metabolic Disorder Labs: Lab Results  Component Value Date   HGBA1C 5.3 06/02/2017   MPG 117 (H) 01/06/2015   MPG 117 (H) 02/17/2014   No results found for: PROLACTIN Lab Results  Component Value Date   CHOL  206 (H) 06/02/2017   TRIG 141 06/02/2017   HDL 76 06/02/2017   CHOLHDL 2.3 09/16/2015   VLDL 26 09/16/2015   LDLCALC 102 (H) 06/02/2017   LDLCALC 69 09/16/2015   Lab Results  Component Value Date   TSH 1.438 09/20/2011    Therapeutic Level Labs: No results found for: LITHIUM No results found for: VALPROATE No components found for:  CBMZ  Current Medications: Current Outpatient Medications  Medication Sig Dispense Refill  . acetaminophen (TYLENOL) 500 MG tablet Take 1,000 mg by mouth every 6 (six) hours as needed for moderate pain.     Marland Kitchen alendronate (FOSAMAX) 10 MG tablet     . ALPRAZolam (XANAX) 1 MG tablet Take 1 tablet (1 mg total) by mouth 3 (three) times daily. 90 tablet 2  . Ascorbic Acid (VITAMIN C) 1000 MG tablet Take 1,000 mg by mouth daily.    . B Complex-C (SUPER B COMPLEX PO) Take 1 tablet by mouth daily.     . cetirizine (ZYRTEC) 10 MG tablet Take 10 mg by mouth daily as needed for allergies.     . cycloSPORINE (RESTASIS) 0.05 % ophthalmic emulsion Place 1 drop into both eyes 2 (two) times daily.     . diclofenac sodium (VOLTAREN) 1 % GEL Apply 2 g topically 4 (four) times daily. 100 g 0  . HYDROcodone-acetaminophen (NORCO/VICODIN) 5-325 MG tablet Take 1 tablet by mouth every 6 (six) hours as needed for moderate pain. 10 tablet 0  . lisinopril (PRINIVIL,ZESTRIL) 20 MG tablet Take 20 mg by mouth daily.     . Magnesium 250 MG TABS Take 250 mg by mouth daily.     . Multiple Vitamin (MULITIVITAMIN WITH MINERALS) TABS Take 1 tablet by mouth daily.    Marland Kitchen  nystatin (NYSTATIN) powder Apply 1 g topically daily.    . pantoprazole (PROTONIX) 40 MG tablet Take 40 mg by mouth daily.     . polyethylene glycol powder (GLYCOLAX/MIRALAX) powder Take 17 g by mouth daily. 3350 g 6  . Potassium 99 MG TABS Take 99 mg by mouth daily.     . predniSONE (DELTASONE) 10 MG tablet Take 1 tablet (10 mg total) by mouth 2 (two) times daily with a meal. 28 tablet 0  . simvastatin (ZOCOR) 40 MG tablet      . traZODone (DESYREL) 100 MG tablet Take 1 tablet (100 mg total) by mouth at bedtime. 90 tablet 2  . Vilazodone HCl (VIIBRYD) 10 MG TABS Take 1 tablet (10 mg total) by mouth daily. 30 tablet 2   No current facility-administered medications for this visit.      Musculoskeletal: Strength & Muscle Tone: within normal limits Gait & Station: normal Patient leans: N/A  Psychiatric Specialty Exam: Review of Systems  Constitutional: Positive for malaise/fatigue.  Gastrointestinal: Positive for abdominal pain.  Musculoskeletal: Positive for joint pain.  Psychiatric/Behavioral: Positive for depression and suicidal ideas. The patient is nervous/anxious and has insomnia.   All other systems reviewed and are negative.   Blood pressure 117/82, pulse 88, height 5' 3"  (1.6 m), weight 152 lb 6.4 oz (69.1 kg), SpO2 96 %.Body mass index is 27 kg/m.  General Appearance: Casual and Fairly Groomed  Eye Contact:  Fair  Speech:  Clear and Coherent  Volume:  Decreased  Mood:  Depressed and Hopeless  Affect:  Constricted, Depressed and Flat  Thought Process:  Goal Directed  Orientation:  Full (Time, Place, and Person)  Thought Content: Rumination   Suicidal Thoughts:  Yes.  without intent/plan  Homicidal Thoughts:  No  Memory:  Immediate;   Good Recent;   Fair Remote;   Fair  Judgement:  Poor  Insight:  Lacking  Psychomotor Activity:  Decreased  Concentration:  Concentration: Poor and Attention Span: Poor  Recall:  [employer] of Knowledge: Fair  Language: Good  Akathisia:  No  Handed:  Right  AIMS (if indicated): not done  Assets:  Communication Skills Desire for Improvement Resilience Social Support Talents/Skills  ADL's:  Intact  Cognition: WNL  Sleep:  Poor   Screenings: MDI     Office Visit from 01/22/2016 in [facility]  Total Score (max 50)  34    Mini-Mental     Office Visit from 02/13/2015 in [facility]   Total Score (max 30 points )  26    PHQ2-9     Office Visit from 05/09/2017 in [facility] Office Visit from 09/16/2015 in [facility]  PHQ-2 Total Score  0  4  PHQ-9 Total Score  -  10    SBQ-R     Office Visit from 01/22/2016 in [facility]  SBQ-R Total Score  14.1       Assessment and Plan: This patient is a 57 year old female with a history of depression and anxiety.  She went off her medication abruptly without telling me and unfortunately now she is in a decline.  We spoke at length about suicide risk and she promised she would not do anything to harm herself.  She is willing to restart the Viibryd at 10 mg daily.  She will continue Xanax 1 mg 3 times daily for anxiety and trazodone 100 mg at bedtime for sleep.  She is  complaining of abdominal pain and agrees to call Dr. Nevada Crane, her primary doctor.  She will return to see me in 4 weeks or call sooner or go to the emergency room if her symptoms worsen.   [name], MD 06/18/2018, 1:58 PM

## 2018-07-02 ENCOUNTER — Telehealth (HOSPITAL_COMMUNITY): Payer: Self-pay | Admitting: *Deleted

## 2018-07-02 NOTE — Telephone Encounter (Signed)
[name] Patient called after receiving letter from Disability Claims.  [name] requesting a letter  Detailing her Mental Health Situation & test results from Dr Sima Matas. She stated she has  10 days before letter has to be sent.

## 2018-07-06 ENCOUNTER — Encounter (HOSPITAL_COMMUNITY): Payer: Self-pay | Admitting: Psychiatry

## 2018-07-06 NOTE — Telephone Encounter (Signed)
Letter done, you will need to attach copy of psychological testing done by dr.Rodenbaugh

## 2018-07-23 ENCOUNTER — Ambulatory Visit (HOSPITAL_COMMUNITY): Payer: 59 | Admitting: Psychiatry

## 2018-07-23 ENCOUNTER — Encounter (HOSPITAL_COMMUNITY): Payer: Self-pay | Admitting: Psychiatry

## 2018-07-23 VITALS — BP 123/83 | HR 77 | Ht 63.0 in | Wt 154.0 lb

## 2018-07-23 DIAGNOSIS — R413 Other amnesia: Secondary | ICD-10-CM | POA: Diagnosis not present

## 2018-07-23 DIAGNOSIS — F332 Major depressive disorder, recurrent severe without psychotic features: Secondary | ICD-10-CM

## 2018-07-23 MED ORDER — TRAZODONE HCL 150 MG PO TABS: 150.0000 mg | ORAL_TABLET | Freq: Every day | ORAL | 2 refills | Status: DC

## 2018-07-23 MED ORDER — VILAZODONE HCL 10 MG PO TABS: 10.0000 mg | ORAL_TABLET | Freq: Every day | ORAL | 2 refills | Status: DC

## 2018-07-23 MED ORDER — ALPRAZOLAM 1 MG PO TABS: 1.0000 mg | ORAL_TABLET | Freq: Three times a day (TID) | ORAL | 2 refills | Status: DC

## 2018-07-23 NOTE — Progress Notes (Signed)
[place] MD/PA/NP OP Progress Note  07/23/2018 2:36 PM [name]  MRN:  [id number]  Chief Complaint:  Chief Complaint    Depression; Anxiety; Follow-up     HPI: This patient is a 58 year old married white female who lives with her husband in [place]. She has no children. 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  The patient was referred by her primary physician, Dr. [name], for further assessment and treatment of depression anxiety and focus problems.  The patient states that she did well most of her life until she had a stroke in 11 at age 43. She had a cerebral aneurysm that burst and she had to have a hematoma evacuated from the right frontal lobe. She has had resulting difficulties ever since and still has weakness on the left side of her body and poor fine motor skills in her left hand. She is right handed. She states that she gets older her symptoms worsen.  The patient often feels anxious particular in crowds. She has frequent panic attacks. She takes Xanax 1 mg twice a day but is reluctant to take more. Shortly after she had her stroke she saw psychiatrist in [place] and was placed on the Xanax. Dr. [name] later put her on Effexor and she is now up to 150 mg per day. [name] not sure if it's helping. She has difficulty sleeping but trazodone helps to some degree. She also is significant problems with focus and attention span. Her thoughts ramble. She'll start one thing and go the next. She can't complete tasks. She finds this extremely frustrating. Her mood is labile at times and she'll get angry quickly and for no reason. She denies auditory or visual hallucinations or paranoia. She does not use drugs and very rarely takes a drink. She admits that time she has passive suicidal ideation but would never hurt her self because of her faith  The patient returns after 1 month.  Last time she had gone off the Viibryd  because of cost.  After going off that she felt very depressed.  We gave her samples to get back on it and she is now able to pay fo rit.  It has helped quite a bit.  Mood has improved and she is no longer crying all the time or feeling despondent or suicidal.  Every now and then she still feels weepy.  She still has low energy and constant body aches and joint pain.  She is reapplying for disability yet again.  She is not sleeping as well as she would like and would like to increase the trazodone and I think this is reasonable. Visit Diagnosis:    ICD-10-CM   1. Major depressive disorder, recurrent, severe without psychotic features ([place]) F33.2   2. Memory deficit R41.3     Past Psychiatric History: Past outpatient treatment for depression  Past Medical History:  Past Medical History:  Diagnosis Date  . Allergy    grass, dust , mold  . Anxiety   . Arthritis   . Bipolar disorder ([place])   . Cancer (HCC)    cervical cancer  . Carpal tunnel syndrome    Bilateral  . Chest pain 09/2011   Cardiac cath-normal coronaries  . Constipation   . Depression   . Difficulty urinating 05/31/2013  . Elevated LFTs 12/16/2013  . GERD (gastroesophageal reflux disease)   . History of kidney stones   . Hyperlipemia   .  Hyperlipidemia   . Hypertension    Mild; provoked by stress and anxiety  . IBS (irritable bowel syndrome)   . Intracerebral bleed ([place])    No aneurysm; followed by Dr. Sherwood Gambler  . Loss of weight 01/06/2015  . Osteoporosis   . Stroke [facility]) 1999   hemorrhagic stroke; weakness of left side    Past Surgical History:  Procedure Laterality Date  . ABDOMINAL HYSTERECTOMY     "cancer cells"  . [place]   to remove blood clot after stroke   . CARDIAC CATHETERIZATION  2016  . CERVICAL FUSION    . CHOLECYSTECTOMY N/A 10/14/2014   Procedure: LAPAROSCOPIC CHOLECYSTECTOMY WITH INTRAOPERATIVE CHOLANGIOGRAM;  Surgeon: Jackolyn Confer, MD;  Location: [facility];  Service: General;   Laterality: N/A;  . CHONDROPLASTY Right 07/13/2017   Procedure: CHONDROPLASTY of patella;  Surgeon: Carole Civil, MD;  Location: [facility];  Service: Orthopedics;  Laterality: Right;  . EUS N/A 08/21/2015   Procedure: ESOPHAGEAL ENDOSCOPIC ULTRASOUND (EUS) RADIAL;  Surgeon: Carol Ada, MD;  Location: [facility];  Service: Endoscopy;  Laterality: N/A;  . KNEE ARTHROSCOPY WITH MEDIAL MENISECTOMY Right 07/13/2017   Procedure: KNEE ARTHROSCOPY WITH PARTIAL MEDIAL MENISECTOMY;  Surgeon: Carole Civil, MD;  Location: [facility];  Service: Orthopedics;  Laterality: Right;  . LEFT HEART CATHETERIZATION WITH CORONARY ANGIOGRAM N/A 09/23/2011   Procedure: LEFT HEART CATHETERIZATION WITH CORONARY ANGIOGRAM;  Surgeon: Thayer Headings, MD;  Location: [facility];  Service: Cardiovascular;  Laterality: N/A;  . LIPOMA EXCISION Left 11/18/2013   Procedure: EXCISION OF SOFT TISSUE MASS-LEFT THIGH;  Surgeon: Jamesetta So, MD;  Location: [facility];  Service: General;  Laterality: Left;  . RECTOCELE REPAIR     x2  . RECTOCELE REPAIR N/A 04/04/2017   Procedure: POSTERIOR REPAIR (RECTOCELE);  Surgeon: Jonnie Kind, MD;  Location: [facility];  Service: Gynecology;  Laterality: N/A;    Family Psychiatric History: See below  Family History:  Family History  Problem Relation Age of Onset  . Cancer Mother        breast   . Hypertension Mother   . Hyperlipidemia Mother   . Depression Mother   . Anxiety disorder Mother   . COPD Mother   . Arthritis Mother        rheumatoid  . Drug abuse Sister   . Coronary artery disease Paternal Grandfather   . Coronary artery disease Paternal Uncle   . Depression Cousin   . Drug abuse Cousin     Social History:  Social History   Socioeconomic History  . Marital status: Married    Spouse name: Sonia Side  . Number of children: 0  . Years of education: HS  . Highest education level: Not on file  Occupational History  . Occupation: unemployed    Comment: pending  disability  Social Needs  . Financial resource strain: Not hard at all  . Food insecurity:    Worry: Never true    Inability: Never true  . Transportation needs:    Medical: No    Non-medical: No  Tobacco Use  . Smoking status: Former Smoker    Packs/day: 1.00    Years: 19.00    Pack years: 19.00    Types: Cigarettes    Last attempt to quit: 09/01/1997    Years since quitting: 20.9  . Smokeless tobacco: Never Used  . Tobacco comment: Quit smoking 1999 , previous 20 pack years  Substance and Sexual Activity  .  Alcohol use: Yes    Comment: 1 drink every other week  . Drug use: No  . Sexual activity: Not Currently    Partners: Male    Birth control/protection: Surgical  Lifestyle  . Physical activity:    Days per week: 2 days    Minutes per session: 30 min  . Stress: Very much  Relationships  . Social connections:    Talks on phone: More than three times a week    Gets together: More than three times a week    Attends religious service: More than 4 times per year    Active member of club or organization: No    Attends meetings of clubs or organizations: Never    Relationship status: Married  Other Topics Concern  . Not on file  Social History Narrative   Currently unable to work   Lives in [place]   Married   Patient drinks 1 cup of caffeine daily.   Patient is right handed.    [employer]ined the [employer] to get more exercise    Allergies:  Allergies  Allergen Reactions  . Morphine And Related Hives  . Promethazine Hcl Other (See Comments)    Causes patient to become Hyper    Metabolic Disorder Labs: Lab Results  Component Value Date   HGBA1C 5.3 06/02/2017   MPG 117 (H) 01/06/2015   MPG 117 (H) 02/17/2014   No results found for: PROLACTIN Lab Results  Component Value Date   CHOL 206 (H) 06/02/2017   TRIG 141 06/02/2017   HDL 76 06/02/2017   CHOLHDL 2.3 09/16/2015   VLDL 26 09/16/2015   LDLCALC 102 (H) 06/02/2017   LDLCALC 69 09/16/2015   Lab Results   Component Value Date   TSH 1.438 09/20/2011    Therapeutic Level Labs: No results found for: LITHIUM No results found for: VALPROATE No components found for:  CBMZ  Current Medications: Current Outpatient Medications  Medication Sig Dispense Refill  . acetaminophen (TYLENOL) 500 MG tablet Take 1,000 mg by mouth every 6 (six) hours as needed for moderate pain.     Marland Kitchen alendronate (FOSAMAX) 10 MG tablet     . ALPRAZolam (XANAX) 1 MG tablet Take 1 tablet (1 mg total) by mouth 3 (three) times daily. 90 tablet 2  . Ascorbic Acid (VITAMIN C) 1000 MG tablet Take 1,000 mg by mouth daily.    . B Complex-C (SUPER B COMPLEX PO) Take 1 tablet by mouth daily.     . cetirizine (ZYRTEC) 10 MG tablet Take 10 mg by mouth daily as needed for allergies.     . cycloSPORINE (RESTASIS) 0.05 % ophthalmic emulsion Place 1 drop into both eyes 2 (two) times daily.     . diclofenac sodium (VOLTAREN) 1 % GEL Apply 2 g topically 4 (four) times daily. 100 g 0  . HYDROcodone-acetaminophen (NORCO/VICODIN) 5-325 MG tablet Take 1 tablet by mouth every 6 (six) hours as needed for moderate pain. 10 tablet 0  . lisinopril (PRINIVIL,ZESTRIL) 20 MG tablet Take 20 mg by mouth daily.     . Magnesium 250 MG TABS Take 250 mg by mouth daily.     . Multiple Vitamin (MULITIVITAMIN WITH MINERALS) TABS Take 1 tablet by mouth daily.    Marland Kitchen nystatin (NYSTATIN) powder Apply 1 g topically daily.    . pantoprazole (PROTONIX) 40 MG tablet Take 40 mg by mouth daily.     . polyethylene glycol powder (GLYCOLAX/MIRALAX) powder Take 17 g by mouth daily. 3350 g  6  . Potassium 99 MG TABS Take 99 mg by mouth daily.     . simvastatin (ZOCOR) 40 MG tablet     . Vilazodone HCl (VIIBRYD) 10 MG TABS Take 1 tablet (10 mg total) by mouth daily. 30 tablet 2  . traZODone (DESYREL) 150 MG tablet Take 1 tablet (150 mg total) by mouth at bedtime. 90 tablet 2   No current facility-administered medications for this visit.      Musculoskeletal: Strength &  Muscle Tone: within normal limits Gait & Station: normal Patient leans: N/A  Psychiatric Specialty Exam: Review of Systems  Constitutional: Positive for malaise/fatigue.  Musculoskeletal: Positive for joint pain and myalgias.  Psychiatric/Behavioral: The patient has insomnia.   All other systems reviewed and are negative.   Blood pressure 123/83, pulse 77, height 5' 3"  (1.6 m), weight 154 lb (69.9 kg), SpO2 97 %.Body mass index is 27.28 kg/m.  General Appearance: Casual and Fairly Groomed  Eye Contact:  Good  Speech:  Clear and Coherent  Volume:  Normal  Mood:  Euthymic  Affect:  Congruent  Thought Process:  Goal Directed  Orientation:  Full (Time, Place, and Person)  Thought Content: Rumination   Suicidal Thoughts:  No  Homicidal Thoughts:  No  Memory:  Immediate;   Good Recent;   Good Remote;   Fair  Judgement:  Fair  Insight:  Fair  Psychomotor Activity:  Decreased  Concentration:  Concentration: Fair and Attention Span: Fair  Recall:  Good  Fund of Knowledge: Good  Language: Good  Akathisia:  No  Handed:  Right  AIMS (if indicated): not done  Assets:  Communication Skills Desire for Improvement Physical Health Resilience Social Support Talents/Skills  ADL's:  Intact  Cognition: WNL  Sleep:  Poor   Screenings: MDI     Office Visit from 01/22/2016 in [facility]  Total Score (max 50)  34    Mini-Mental     Office Visit from 02/13/2015 in [facility]  Total Score (max 30 points )  26    PHQ2-9     Office Visit from 05/09/2017 in [facility] Office Visit from 09/16/2015 in [facility]  PHQ-2 Total Score  0  4  PHQ-9 Total Score  -  10    SBQ-R     Office Visit from 01/22/2016 in [facility]  SBQ-R Total Score  14.1       Assessment and Plan:  This patient is a 58 year old female with fibromyalgia chronic fatigue  depression anxiety and insomnia.  She is doing better with the addition of Viibryd 10 mg daily for depression.  She will continue this along with Xanax 1 mg up to 3 times a day for anxiety.  Her trazodone will be increased to 150 mg at bedtime for sleep.  She will return to see me in 3 months  [name], MD 07/23/2018, 2:36 PM

## 2018-10-22 ENCOUNTER — Ambulatory Visit (HOSPITAL_COMMUNITY): Payer: 59 | Admitting: Psychiatry

## 2018-11-09 ENCOUNTER — Ambulatory Visit (INDEPENDENT_AMBULATORY_CARE_PROVIDER_SITE_OTHER): Payer: 59 | Admitting: Psychiatry

## 2018-11-09 ENCOUNTER — Encounter (HOSPITAL_COMMUNITY): Payer: Self-pay | Admitting: Psychiatry

## 2018-11-09 ENCOUNTER — Other Ambulatory Visit: Payer: Self-pay

## 2018-11-09 DIAGNOSIS — F332 Major depressive disorder, recurrent severe without psychotic features: Secondary | ICD-10-CM

## 2018-11-09 DIAGNOSIS — R413 Other amnesia: Secondary | ICD-10-CM | POA: Diagnosis not present

## 2018-11-09 MED ORDER — TRAZODONE HCL 150 MG PO TABS: 150.0000 mg | ORAL_TABLET | Freq: Every day | ORAL | 2 refills | Status: DC

## 2018-11-09 MED ORDER — ALPRAZOLAM 1 MG PO TABS: 1.0000 mg | ORAL_TABLET | Freq: Three times a day (TID) | ORAL | 2 refills | Status: DC

## 2018-11-09 MED ORDER — VILAZODONE HCL 10 MG PO TABS: 10.0000 mg | ORAL_TABLET | Freq: Every day | ORAL | 2 refills | Status: DC

## 2018-11-09 NOTE — Progress Notes (Signed)
Virtual Visit via Telephone Note  I connected with [name] on 11/09/18 at 11:20 AM EDT by telephone and verified that I am speaking with the correct person using two identifiers.   I discussed the limitations, risks, security and privacy concerns of performing an evaluation and management service by telephone and the availability of in person appointments. I also discussed with the patient that there may be a patient responsible charge related to this service. The patient expressed understanding and agreed to proceed.      I discussed the assessment and treatment plan with the patient. The patient was provided an opportunity to ask questions and all were answered. The patient agreed with the plan and demonstrated an understanding of the instructions.   The patient was advised to call back or seek an in-person evaluation if the symptoms worsen or if the condition fails to improve as anticipated.  I provided 15 minutes of non-face-to-face time during this encounter.   [name], MD  [facility] MD/PA/NP OP Progress Note  11/09/2018 12:03 PM [name]  MRN:  [id number]  Chief Complaint:  Chief Complaint    Depression; Anxiety; Follow-up     HPI: This patient is a 58 year old married white female who lives with her husband in [place]. She has no children. 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  The patient was referred by her primary physician, Dr. [name], for further assessment and treatment of depression anxiety and focus problems.  The patient states that she did well most of her life until she had a stroke in 89 at age 72. She had a cerebral aneurysm that burst and she had to have a hematoma evacuated from the right frontal lobe. She has had resulting difficulties ever since and still has weakness on the left side of her body and poor fine motor skills in her left hand. She is right handed. She  states that she gets older her symptoms worsen.  The patient often feels anxious particular in crowds. She has frequent panic attacks. She takes Xanax 1 mg twice a day but is reluctant to take more. Shortly after she had her stroke she saw psychiatrist in [place] and was placed on the Xanax. Dr. [name] later put her on Effexor and she is now up to 150 mg per day. [name] not sure if it's helping. She has difficulty sleeping but trazodone helps to some degree. She also is significant problems with focus and attention span. Her thoughts ramble. She'll start one thing and go the next. She can't complete tasks. She finds this extremely frustrating. Her mood is labile at times and she'll get angry quickly and for no reason. She denies auditory or visual hallucinations or paranoia. She does not use drugs and very rarely takes a drink. She admits that time she has passive suicidal ideation but would never hurt her self because of her faith  The patient returns after 3 months and is assessed by telephone because of the coronavirus pandemic.  She states that overall she has been doing okay.  She had a bad time when the pandemic first started and she felt very depressed and overwhelmed.  She claimed that she even had pills in her hand and was thinking of taking an overdose but talked her self out of it.  She is since started to do better and is no longer thinking this way.  She still has a  lot of chronic pain from arthritis and this gets her down but she is trying to get outside every day and walk her dog.  She has not had the energy to do her gardening but is thinking about starting it out slowly.  She is sleeping well and eating well.  The Xanax continues to help with her anxiety.  She states that the [place] has helped her mood.  She initially had diarrhea but this resolved as well.  Visit Diagnosis:    ICD-10-CM   1. Major depressive disorder, recurrent, severe without psychotic features ([place]) F33.2   2. Memory  deficit R41.3     Past Psychiatric History: Past outpatient treatment for depression  Past Medical History:  Past Medical History:  Diagnosis Date  . Allergy    grass, dust , mold  . Anxiety   . Arthritis   . Bipolar disorder ([place])   . Cancer (HCC)    cervical cancer  . Carpal tunnel syndrome    Bilateral  . Chest pain 09/2011   Cardiac cath-normal coronaries  . Constipation   . Depression   . Difficulty urinating 05/31/2013  . Elevated LFTs 12/16/2013  . GERD (gastroesophageal reflux disease)   . History of kidney stones   . Hyperlipemia   . Hyperlipidemia   . Hypertension    Mild; provoked by stress and anxiety  . IBS (irritable bowel syndrome)   . Intracerebral bleed ([place])    No aneurysm; followed by Dr. Sherwood Gambler  . Loss of weight 01/06/2015  . Osteoporosis   . Stroke [facility]) 1999   hemorrhagic stroke; weakness of left side    Past Surgical History:  Procedure Laterality Date  . ABDOMINAL HYSTERECTOMY     "cancer cells"  . [place]   to remove blood clot after stroke   . CARDIAC CATHETERIZATION  2016  . CERVICAL FUSION    . CHOLECYSTECTOMY N/A 10/14/2014   Procedure: LAPAROSCOPIC CHOLECYSTECTOMY WITH INTRAOPERATIVE CHOLANGIOGRAM;  Surgeon: Jackolyn Confer, MD;  Location: [facility];  Service: General;  Laterality: N/A;  . CHONDROPLASTY Right 07/13/2017   Procedure: CHONDROPLASTY of patella;  Surgeon: Carole Civil, MD;  Location: [facility];  Service: Orthopedics;  Laterality: Right;  . EUS N/A 08/21/2015   Procedure: ESOPHAGEAL ENDOSCOPIC ULTRASOUND (EUS) RADIAL;  Surgeon: Carol Ada, MD;  Location: [facility];  Service: Endoscopy;  Laterality: N/A;  . KNEE ARTHROSCOPY WITH MEDIAL MENISECTOMY Right 07/13/2017   Procedure: KNEE ARTHROSCOPY WITH PARTIAL MEDIAL MENISECTOMY;  Surgeon: Carole Civil, MD;  Location: [facility];  Service: Orthopedics;  Laterality: Right;  . LEFT HEART CATHETERIZATION WITH CORONARY ANGIOGRAM N/A 09/23/2011   Procedure: LEFT HEART  CATHETERIZATION WITH CORONARY ANGIOGRAM;  Surgeon: Thayer Headings, MD;  Location[facility] C[facility];  Service: Cardiovascular;  Laterality: N/A;  . LIPOMA EXCISION Left 11/18/2013   Procedure: EXCISION OF SOFT TISSUE MASS-LEFT THIGH;  Surgeon: Jamesetta So, MD;  Location: [facility];  Service: General;  Laterality: Left;  . RECTOCELE REPAIR     x2  . RECTOCELE REPAIR N/A 04/04/2017   Procedure: POSTERIOR REPAIR (RECTOCELE);  Surgeon: Jonnie Kind, MD;  Location: [facility];  Service: Gynecology;  Laterality: N/A;    Family Psychiatric History: see below  Family History:  Family History  Problem Relation Age of Onset  . Cancer Mother        breast   . Hypertension Mother   . Hyperlipidemia Mother   . Depression Mother   . Anxiety disorder Mother   .  COPD Mother   . Arthritis Mother        rheumatoid  . Drug abuse Sister   . Coronary artery disease Paternal Grandfather   . Coronary artery disease Paternal Uncle   . Depression Cousin   . Drug abuse Cousin     Social History:  Social History   Socioeconomic History  . Marital status: Married    Spouse name: Sonia Side  . Number of children: 0  . Years of education: HS  . Highest education level: Not on file  Occupational History  . Occupation: unemployed    Comment: pending disability  Social Needs  . Financial resource strain: Not hard at all  . Food insecurity:    Worry: Never true    Inability: Never true  . Transportation needs:    Medical: No    Non-medical: No  Tobacco Use  . Smoking status: Former Smoker    Packs/day: 1.00    Years: 19.00    Pack years: 19.00    Types: Cigarettes    Last attempt to quit: 09/01/1997    Years since quitting: 21.2  . Smokeless tobacco: Never Used  . Tobacco comment: Quit smoking 1999 , previous 20 pack years  Substance and Sexual Activity  . Alcohol use: Yes    Comment: 1 drink every other week  . Drug use: No  . Sexual activity: Not Currently    Partners: Male    Birth  control/protection: Surgical  Lifestyle  . Physical activity:    Days per week: 2 days    Minutes per session: 30 min  . Stress: Very much  Relationships  . Social connections:    Talks on phone: More than three times a week    Gets together: More than three times a week    Attends religious service: More than 4 times per year    Active member of club or organization: No    Attends meetings of clubs or organizations: Never    Relationship status: Married  Other Topics Concern  . Not on file  Social History Narrative   Currently unable to work   Lives in [place]   Married   Patient drinks 1 cup of caffeine daily.   Patient is right handed.    [employer]ined the [employer] to get more exercise    Allergies:  Allergies  Allergen Reactions  . Morphine And Related Hives  . Promethazine Hcl Other (See Comments)    Causes patient to become Hyper    Metabolic Disorder Labs: Lab Results  Component Value Date   HGBA1C 5.3 06/02/2017   MPG 117 (H) 01/06/2015   MPG 117 (H) 02/17/2014   No results found for: PROLACTIN Lab Results  Component Value Date   CHOL 206 (H) 06/02/2017   TRIG 141 06/02/2017   HDL 76 06/02/2017   CHOLHDL 2.3 09/16/2015   VLDL 26 09/16/2015   LDLCALC 102 (H) 06/02/2017   LDLCALC 69 09/16/2015   Lab Results  Component Value Date   TSH 1.438 09/20/2011    Therapeutic Level Labs: No results found for: LITHIUM No results found for: VALPROATE No components found for:  CBMZ  Current Medications: Current Outpatient Medications  Medication Sig Dispense Refill  . acetaminophen (TYLENOL) 500 MG tablet Take 1,000 mg by mouth every 6 (six) hours as needed for moderate pain.     Marland Kitchen alendronate (FOSAMAX) 10 MG tablet     . ALPRAZolam (XANAX) 1 MG tablet Take 1 tablet (1 mg total) by  mouth 3 (three) times daily. 90 tablet 2  . Ascorbic Acid (VITAMIN C) 1000 MG tablet Take 1,000 mg by mouth daily.    . B Complex-C (SUPER B COMPLEX PO) Take 1 tablet by mouth daily.      . cetirizine (ZYRTEC) 10 MG tablet Take 10 mg by mouth daily as needed for allergies.     . cycloSPORINE (RESTASIS) 0.05 % ophthalmic emulsion Place 1 drop into both eyes 2 (two) times daily.     . diclofenac sodium (VOLTAREN) 1 % GEL Apply 2 g topically 4 (four) times daily. 100 g 0  . lisinopril (PRINIVIL,ZESTRIL) 20 MG tablet Take 20 mg by mouth daily.     . Magnesium 250 MG TABS Take 250 mg by mouth daily.     . Multiple Vitamin (MULITIVITAMIN WITH MINERALS) TABS Take 1 tablet by mouth daily.    Marland Kitchen nystatin (NYSTATIN) powder Apply 1 g topically daily.    . pantoprazole (PROTONIX) 40 MG tablet Take 40 mg by mouth daily.     . polyethylene glycol powder (GLYCOLAX/MIRALAX) powder Take 17 g by mouth daily. 3350 g 6  . Potassium 99 MG TABS Take 99 mg by mouth daily.     . simvastatin (ZOCOR) 40 MG tablet     . traZODone (DESYREL) 150 MG tablet Take 1 tablet (150 mg total) by mouth at bedtime. 90 tablet 2  . Vilazodone HCl (VIIBRYD) 10 MG TABS Take 1 tablet (10 mg total) by mouth daily. 30 tablet 2   No current facility-administered medications for this visit.      Musculoskeletal: Strength & Muscle Tone: within normal limits Gait & Station: normal Patient leans: N/A  Psychiatric Specialty Exam: Review of Systems  Constitutional: Positive for malaise/fatigue.  Musculoskeletal: Positive for back pain, joint pain and myalgias.  Psychiatric/Behavioral: Positive for depression.  All other systems reviewed and are negative.   There were no vitals taken for this visit.There is no height or weight on file to calculate BMI.  General Appearance: NA  Eye Contact:  NA  Speech:  Clear and Coherent  Volume:  Normal  Mood:  Anxious  Affect:  NA  Thought Process:  Goal Directed  Orientation:  Full (Time, Place, and Person)  Thought Content: Rumination   Suicidal Thoughts:  No  Homicidal Thoughts:  No  Memory:  Immediate;   Good Recent;   Fair Remote;   Fair  Judgement:  Fair  Insight:   Fair  Psychomotor Activity:  Decreased  Concentration:  Concentration: Good and Attention Span: Good  Recall:  Good  Fund of Knowledge: Good  Language: Good  Akathisia:  No  Handed:  Right  AIMS (if indicated): not done  Assets:  Communication Skills Desire for Improvement Resilience Social Support Talents/Skills  ADL's:  Intact  Cognition: WNL  Sleep:  Good   Screenings: MDI     Office Visit from 01/22/2016 in [facility]  Total Score (max 50)  34    Mini-Mental     Office Visit from 02/13/2015 in [facility]  Total Score (max 30 points )  26    PHQ2-9     Office Visit from 05/09/2017 in [facility] Office Visit from 09/16/2015 in [facility]  PHQ-2 Total Score  0  4  PHQ-9 Total Score  -  10    SBQ-R     Office Visit from 01/22/2016 in [facility]  SBQ-R Total Score  14.1       Assessment and Plan: This patient is a 58 year old female with a history of depression and anxiety.  She has had some rough spots dealing with the coronavirus pandemic but right now seems to be in a good place.  She will continue Viibryd 10 mg daily for depression, trazodone 150 mg at bedtime for sleep and Xanax 1 mg 3 times daily for anxiety.  She will return to see me in 4 weeks.   [name], MD 11/09/2018, 12:03 PM

## 2018-11-12 ENCOUNTER — Other Ambulatory Visit: Payer: Self-pay

## 2018-11-15 ENCOUNTER — Other Ambulatory Visit (HOSPITAL_COMMUNITY): Payer: Self-pay | Admitting: Psychiatry

## 2018-11-15 ENCOUNTER — Telehealth (HOSPITAL_COMMUNITY): Payer: Self-pay

## 2018-11-15 MED ORDER — VENLAFAXINE HCL ER 75 MG PO CP24: ORAL_CAPSULE | ORAL | 2 refills | Status: DC

## 2018-11-15 NOTE — Telephone Encounter (Signed)
Yes, sorry

## 2018-11-15 NOTE — Telephone Encounter (Signed)
Medication management - Telephone call with patient to inform Dr. Harrington Challenger wanted her to stop her prescribed Viibryd and to start Effexor XR at 75 mg, one in the morning for 2 weeks aand then increase to 2 each morning.  Informed patient of potential risks and benefits of the medication such as potential dizziness and patient will call back if any side effects are too upsetting but has taken Effexor in the past.  Patient thankful for change and reminded about fall precautions as she restarts the medication and to let us know if any worsening of symptoms or problems related to starting Effexor XR.  Patient stated understanding instructions for titration schedule as well.

## 2018-11-15 NOTE — Telephone Encounter (Signed)
Medication problem - Telephone call with pt after she left a message she would like Dr. Harrington Challenger to restart her on Effexor XR which she stated she use to take and did well on for many years.  Patient stated her PCP was concerned the Viibryd was causing her to have constant diarrhea and also attributing to some liver problems she is now experiencing.  Patient reported Dr. Harrington Challenger agreed the Viibryd may be attributing to diarrhea as well.  Informed patient she does have an appointment set for 12/05/2018 at 3:40pm with Dr. Harrington Challenger but agreed to send the request to Dr. Harrington Challenger today as she would like to stop the Viibryd and restart Effexor XR if Dr. Harrington Challenger agrees.

## 2018-11-15 NOTE — Telephone Encounter (Signed)
effexor xr sent in, tell to to read directions on bottle, she can stop trintellix

## 2018-12-05 ENCOUNTER — Other Ambulatory Visit: Payer: Self-pay

## 2018-12-05 ENCOUNTER — Ambulatory Visit (HOSPITAL_COMMUNITY): Payer: Self-pay | Admitting: Psychiatry

## 2018-12-12 ENCOUNTER — Encounter (HOSPITAL_COMMUNITY): Payer: Self-pay | Admitting: Psychiatry

## 2018-12-12 ENCOUNTER — Ambulatory Visit (INDEPENDENT_AMBULATORY_CARE_PROVIDER_SITE_OTHER): Payer: Self-pay | Admitting: Psychiatry

## 2018-12-12 ENCOUNTER — Other Ambulatory Visit: Payer: Self-pay

## 2018-12-12 DIAGNOSIS — R413 Other amnesia: Secondary | ICD-10-CM

## 2018-12-12 DIAGNOSIS — F332 Major depressive disorder, recurrent severe without psychotic features: Secondary | ICD-10-CM

## 2018-12-12 MED ORDER — ALPRAZOLAM 1 MG PO TABS: 1.0000 mg | ORAL_TABLET | Freq: Three times a day (TID) | ORAL | 2 refills | Status: DC

## 2018-12-12 MED ORDER — VENLAFAXINE HCL ER 150 MG PO CP24: 150.0000 mg | ORAL_CAPSULE | Freq: Every day | ORAL | 2 refills | Status: DC

## 2018-12-12 MED ORDER — TRAZODONE HCL 150 MG PO TABS: 150.0000 mg | ORAL_TABLET | Freq: Every day | ORAL | 2 refills | Status: DC

## 2018-12-12 NOTE — Progress Notes (Signed)
Virtual Visit via Telephone Note  I connected with [name] on 12/12/18 at  9:20 AM EDT by telephone and verified that I am speaking with the correct person using two identifiers.   I discussed the limitations, risks, security and privacy concerns of performing an evaluation and management service by telephone and the availability of in person appointments. I also discussed with the patient that there may be a patient responsible charge related to this service. The patient expressed understanding and agreed to proceed.     I discussed the assessment and treatment plan with the patient. The patient was provided an opportunity to ask questions and all were answered. The patient agreed with the plan and demonstrated an understanding of the instructions.   The patient was advised to call back or seek an in-person evaluation if the symptoms worsen or if the condition fails to improve as anticipated.  I provided 15 minutes of non-face-to-face time during this encounter.   [name], MD  [facility] MD/PA/NP OP Progress Note  12/12/2018 9:46 AM [name]  MRN:  [id number]  Chief Complaint:  Chief Complaint    Anxiety; Depression; Follow-up     HPI: This patient is a 58 year old married white female who lives with her husband in [place]. She has no children. 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  The patient was referred by her primary physician, Dr. [name], for further assessment and treatment of depression anxiety and focus problems.  The patient states that she did well most of her life until she had a stroke in 22 at age 74. She had a cerebral aneurysm that burst and she had to have a hematoma evacuated from the right frontal lobe. She has had resulting difficulties ever since and still has weakness on the left side of her body and poor fine motor skills in her left hand. She is right handed. She  states that she gets older her symptoms worsen.  The patient often feels anxious particular in crowds. She has frequent panic attacks. She takes Xanax 1 mg twice a day but is reluctant to take more. Shortly after she had her stroke she saw psychiatrist in [place] and was placed on the Xanax. Dr. [name] later put her on Effexor and she is now up to 150 mg per day. [name] not sure if it's helping. She has difficulty sleeping but trazodone helps to some degree. She also is significant problems with focus and attention span. Her thoughts ramble. She'll start one thing and go the next. She can't complete tasks. She finds this extremely frustrating. Her mood is labile at times and she'll get angry quickly and for no reason. She denies auditory or visual hallucinations or paranoia. She does not use drugs and very rarely takes a drink. She admits that time she has passive suicidal ideation but would never hurt her self because of her faith  The patient returns for follow-up after 4 weeks.  Last time she stated that she felt more depressed and anxious about the pandemic.  However about a week after her visit she called and stated she could no longer tolerate the Viibryd because of the chronic diarrhea.  We switched her back to Effexor XR and she is now up to 150 mg daily.  She states that she feels "great."  Her energy and mood are improved.  She is getting out working in her garden her sleep  is good and she is no longer focused on chronic arthritic pain.  She states that Tylenol arthritis seems to be taking care of it.  She is sleeping well and her anxiety is under good control. Visit Diagnosis:    ICD-10-CM   1. Major depressive disorder, recurrent, severe without psychotic features ([place]) F33.2   2. Memory deficit R41.3     Past Psychiatric History: Past outpatient treatment for depression  Past Medical History:  Past Medical History:  Diagnosis Date  . Allergy    grass, dust , mold  . Anxiety   .  Arthritis   . Bipolar disorder ([place])   . Cancer (HCC)    cervical cancer  . Carpal tunnel syndrome    Bilateral  . Chest pain 09/2011   Cardiac cath-normal coronaries  . Constipation   . Depression   . Difficulty urinating 05/31/2013  . Elevated LFTs 12/16/2013  . GERD (gastroesophageal reflux disease)   . History of kidney stones   . Hyperlipemia   . Hyperlipidemia   . Hypertension    Mild; provoked by stress and anxiety  . IBS (irritable bowel syndrome)   . Intracerebral bleed ([place])    No aneurysm; followed by Dr. Sherwood Gambler  . Loss of weight 01/06/2015  . Osteoporosis   . Stroke [facility]) 1999   hemorrhagic stroke; weakness of left side    Past Surgical History:  Procedure Laterality Date  . ABDOMINAL HYSTERECTOMY     "cancer cells"  . [place]   to remove blood clot after stroke   . CARDIAC CATHETERIZATION  2016  . CERVICAL FUSION    . CHOLECYSTECTOMY N/A 10/14/2014   Procedure: LAPAROSCOPIC CHOLECYSTECTOMY WITH INTRAOPERATIVE CHOLANGIOGRAM;  Surgeon: Jackolyn Confer, MD;  Location: [place];  Service: General;  Laterality: N/A;  . CHONDROPLASTY Right 07/13/2017   Procedure: CHONDROPLASTY of patella;  Surgeon: Carole Civil, MD;  Location: [facility];  Service: Orthopedics;  Laterality: Right;  . EUS N/A 08/21/2015   Procedure: ESOPHAGEAL ENDOSCOPIC ULTRASOUND (EUS) RADIAL;  Surgeon: Carol Ada, MD;  Location: [facility];  Service: Endoscopy;  Laterality: N/A;  . KNEE ARTHROSCOPY WITH MEDIAL MENISECTOMY Right 07/13/2017   Procedure: KNEE ARTHROSCOPY WITH PARTIAL MEDIAL MENISECTOMY;  Surgeon: Carole Civil, MD;  Location: [facility];  Service: Orthopedics;  Laterality: Right;  . LEFT HEART CATHETERIZATION WITH CORONARY ANGIOGRAM N/A 09/23/2011   Procedure: LEFT HEART CATHETERIZATION WITH CORONARY ANGIOGRAM;  Surgeon: Thayer Headings, MD;  Location: [facility] C[facility] LAB;  Service: Cardiovascular;  Laterality: N/A;  . LIPOMA EXCISION Left 11/18/2013   Procedure: EXCISION OF SOFT  TISSUE MASS-LEFT THIGH;  Surgeon: Jamesetta So, MD;  Location: [facility];  Service: General;  Laterality: Left;  . RECTOCELE REPAIR     x2  . RECTOCELE REPAIR N/A 04/04/2017   Procedure: POSTERIOR REPAIR (RECTOCELE);  Surgeon: Jonnie Kind, MD;  Location: [facility];  Service: Gynecology;  Laterality: N/A;    Family Psychiatric History: see below  Family History:  Family History  Problem Relation Age of Onset  . Cancer Mother        breast   . Hypertension Mother   . Hyperlipidemia Mother   . Depression Mother   . Anxiety disorder Mother   . COPD Mother   . Arthritis Mother        rheumatoid  . Drug abuse Sister   . Coronary artery disease Paternal Grandfather   . Coronary artery disease Paternal Uncle   . Depression Cousin   .  Drug abuse Cousin     Social History:  Social History   Socioeconomic History  . Marital status: Married    Spouse name: Sonia Side  . Number of children: 0  . Years of education: HS  . Highest education level: Not on file  Occupational History  . Occupation: unemployed    Comment: pending disability  Social Needs  . Financial resource strain: Not hard at all  . Food insecurity:    Worry: Never true    Inability: Never true  . Transportation needs:    Medical: No    Non-medical: No  Tobacco Use  . Smoking status: Former Smoker    Packs/day: 1.00    Years: 19.00    Pack years: 19.00    Types: Cigarettes    Last attempt to quit: 09/01/1997    Years since quitting: 21.2  . Smokeless tobacco: Never Used  . Tobacco comment: Quit smoking 1999 , previous 20 pack years  Substance and Sexual Activity  . Alcohol use: Yes    Comment: 1 drink every other week  . Drug use: No  . Sexual activity: Not Currently    Partners: Male    Birth control/protection: Surgical  Lifestyle  . Physical activity:    Days per week: 2 days    Minutes per session: 30 min  . Stress: Very much  Relationships  . Social connections:    Talks on phone: More than three  times a week    Gets together: More than three times a week    Attends religious service: More than 4 times per year    Active member of club or organization: No    Attends meetings of clubs or organizations: Never    Relationship status: Married  Other Topics Concern  . Not on file  Social History Narrative   Currently unable to work   Lives in [place]   Married   Patient drinks 1 cup of caffeine daily.   Patient is right handed.    [employer]ined [employer] to get more exercise    Allergies:  Allergies  Allergen Reactions  . Morphine And Related Hives  . Promethazine Hcl Other (See Comments)    Causes patient to become Hyper    Metabolic Disorder Labs: Lab Results  Component Value Date   HGBA1C 5.3 06/02/2017   MPG 117 (H) 01/06/2015   MPG 117 (H) 02/17/2014   No results found for: PROLACTIN Lab Results  Component Value Date   CHOL 206 (H) 06/02/2017   TRIG 141 06/02/2017   HDL 76 06/02/2017   CHOLHDL 2.3 09/16/2015   VLDL 26 09/16/2015   LDLCALC 102 (H) 06/02/2017   LDLCALC 69 09/16/2015   Lab Results  Component Value Date   TSH 1.438 09/20/2011    Therapeutic Level Labs: No results found for: LITHIUM No results found for: VALPROATE No components found for:  CBMZ  Current Medications: Current Outpatient Medications  Medication Sig Dispense Refill  . acetaminophen (TYLENOL) 500 MG tablet Take 1,000 mg by mouth every 6 (six) hours as needed for moderate pain.     Marland Kitchen alendronate (FOSAMAX) 10 MG tablet     . ALPRAZolam (XANAX) 1 MG tablet Take 1 tablet (1 mg total) by mouth 3 (three) times daily. 90 tablet 2  . Ascorbic Acid (VITAMIN C) 1000 MG tablet Take 1,000 mg by mouth daily.    . B Complex-C (SUPER B COMPLEX PO) Take 1 tablet by mouth daily.     Marland Kitchen  cetirizine (ZYRTEC) 10 MG tablet Take 10 mg by mouth daily as needed for allergies.     . cycloSPORINE (RESTASIS) 0.05 % ophthalmic emulsion Place 1 drop into both eyes 2 (two) times daily.     . diclofenac sodium  (VOLTAREN) 1 % GEL Apply 2 g topically 4 (four) times daily. 100 g 0  . lisinopril (PRINIVIL,ZESTRIL) 20 MG tablet Take 20 mg by mouth daily.     . Magnesium 250 MG TABS Take 250 mg by mouth daily.     . Multiple Vitamin (MULITIVITAMIN WITH MINERALS) TABS Take 1 tablet by mouth daily.    Marland Kitchen nystatin (NYSTATIN) powder Apply 1 g topically daily.    . pantoprazole (PROTONIX) 40 MG tablet Take 40 mg by mouth daily.     . polyethylene glycol powder (GLYCOLAX/MIRALAX) powder Take 17 g by mouth daily. 3350 g 6  . Potassium 99 MG TABS Take 99 mg by mouth daily.     . simvastatin (ZOCOR) 40 MG tablet     . traZODone (DESYREL) 150 MG tablet Take 1 tablet (150 mg total) by mouth at bedtime. 90 tablet 2  . venlafaxine XR (EFFEXOR XR) 150 MG 24 hr capsule Take 1 capsule (150 mg total) by mouth daily with breakfast. 90 capsule 2   No current facility-administered medications for this visit.      Musculoskeletal: Strength & Muscle Tone: within normal limits Gait & Station: normal Patient leans: N/A  Psychiatric Specialty Exam: Review of Systems  Musculoskeletal: Positive for joint pain.    There were no vitals taken for this visit.There is no height or weight on file to calculate BMI.  General Appearance: NA  Eye Contact:  NA  Speech:  Clear and Coherent  Volume:  Normal  Mood:  Euthymic  Affect:  NA  Thought Process:  Goal Directed  Orientation:  Full (Time, Place, and Person)  Thought Content: WDL   Suicidal Thoughts:  No  Homicidal Thoughts:  No  Memory:  Immediate;   Good Recent;   Good Remote;   Fair  Judgement:  Good  Insight:  Fair  Psychomotor Activity:  Normal  Concentration:  Concentration: Good and Attention Span: Good  Recall:  Good  Fund of Knowledge: Good  Language: Good  Akathisia:  No  Handed:  Right  AIMS (if indicated): not done  Assets:  Communication Skills Desire for Improvement Resilience Social Support Talents/Skills  ADL's:  Intact  Cognition: WNL   Sleep:  Good   Screenings: MDI     Office Visit from 01/22/2016 in [facility]  Total Score (max 50)  34    Mini-Mental     Office Visit from 02/13/2015 in [facility]  Total Score (max 30 points )  26    PHQ2-9     Office Visit from 05/09/2017 in [facility] Office Visit from 09/16/2015 in [facility]  PHQ-2 Total Score  0  4  PHQ-9 Total Score  -  10    SBQ-R     Office Visit from 01/22/2016 in [facility]  SBQ-R Total Score  14.1       Assessment and Plan:  This patient is a 58 year old female with a history of depression and anxiety.  She seems to be doing well now that she is back on Effexor XR 150 mg daily for depression.  She will also continue Xanax 1 mg 3 times daily for anxiety and trazodone 150  mg at bedtime for sleep.  She will return to see me in 3 months  [name], MD 12/12/2018, 9:46 AM

## 2019-01-15 ENCOUNTER — Other Ambulatory Visit (HOSPITAL_COMMUNITY): Payer: Self-pay | Admitting: Obstetrics and Gynecology

## 2019-01-15 DIAGNOSIS — Z1231 Encounter for screening mammogram for malignant neoplasm of breast: Secondary | ICD-10-CM

## 2019-01-24 ENCOUNTER — Ambulatory Visit (HOSPITAL_COMMUNITY)
Admission: RE | Admit: 2019-01-24 | Discharge: 2019-01-24 | Disposition: A | Discharge status: Home / Self Care | Payer: No Typology Code available for payment source | Source: Ambulatory Visit | Attending: Obstetrics and Gynecology | Admitting: Obstetrics and Gynecology

## 2019-01-24 ENCOUNTER — Other Ambulatory Visit: Payer: Self-pay

## 2019-01-24 DIAGNOSIS — Z1231 Encounter for screening mammogram for malignant neoplasm of breast: Secondary | ICD-10-CM | POA: Diagnosis present

## 2019-03-08 ENCOUNTER — Ambulatory Visit (INDEPENDENT_AMBULATORY_CARE_PROVIDER_SITE_OTHER): Payer: No Typology Code available for payment source | Admitting: Orthopedic Surgery

## 2019-03-08 ENCOUNTER — Other Ambulatory Visit: Payer: Self-pay

## 2019-03-08 VITALS — BP 131/75 | HR 76 | Ht 63.0 in | Wt 150.0 lb

## 2019-03-08 DIAGNOSIS — M1712 Unilateral primary osteoarthritis, left knee: Secondary | ICD-10-CM | POA: Diagnosis not present

## 2019-03-08 DIAGNOSIS — M7051 Other bursitis of knee, right knee: Secondary | ICD-10-CM

## 2019-03-08 DIAGNOSIS — Z9889 Other specified postprocedural states: Secondary | ICD-10-CM

## 2019-03-08 NOTE — Progress Notes (Signed)
Chief Complaint  Patient presents with  . Knee Pain    bilateral wants injections    [name] has bilateral knee pain status post arthroscopy right knee developed bursitis would like an injection there and then she has arthritis in her left knee would like an injection in the joint she also complains of frequent ankle instability on the left with a history of chronic ankle instability with subacute twisting injury a week or so ago  Procedure note right knee injection   verbal consent was obtained to inject right knee joint  Timeout was completed to confirm the site of injection  The medications used were 40 mg of Depo-Medrol and 1% lidocaine 3 cc  Anesthesia was provided by ethyl chloride and the skin was prepped with alcohol.  After cleaning the skin with alcohol a 20-gauge needle was used to inject the right knee joint. There were no complications. A sterile bandage was applied.   Procedure note Left knee injection for bursitis   verbal consent was obtained to inject Left knee PES BURSA  Timeout was completed to confirm the site of injection  The medications used were 40 mg of Depo-Medrol and 1% lidocaine 3 cc  Anesthesia was provided by ethyl chloride and the skin was prepped with alcohol.  After cleaning the skin with alcohol a 25-gauge needle was used to inject the left knee bursa   There were no complications and a sterile bandage was applied   Recommend ankle exercises from the [employer] website  Follow-up as needed  Encounter Diagnoses  Name Primary?  . Pes anserinus bursitis of right knee Yes  . S/P right knee arthroscopy 07/13/17   . Primary osteoarthritis of left knee

## 2019-03-08 NOTE — Patient Instructions (Signed)

## 2019-03-14 ENCOUNTER — Other Ambulatory Visit: Payer: Self-pay

## 2019-03-14 ENCOUNTER — Encounter (HOSPITAL_COMMUNITY): Payer: Self-pay | Admitting: Psychiatry

## 2019-03-14 ENCOUNTER — Ambulatory Visit (INDEPENDENT_AMBULATORY_CARE_PROVIDER_SITE_OTHER): Payer: No Typology Code available for payment source | Admitting: Psychiatry

## 2019-03-14 DIAGNOSIS — F332 Major depressive disorder, recurrent severe without psychotic features: Secondary | ICD-10-CM | POA: Diagnosis not present

## 2019-03-14 MED ORDER — ALPRAZOLAM 1 MG PO TABS: 1.0000 mg | ORAL_TABLET | Freq: Three times a day (TID) | ORAL | 2 refills | Status: DC

## 2019-03-14 MED ORDER — TRAZODONE HCL 150 MG PO TABS: 150.0000 mg | ORAL_TABLET | Freq: Every day | ORAL | 2 refills | Status: DC

## 2019-03-14 MED ORDER — VENLAFAXINE HCL ER 150 MG PO CP24: 150.0000 mg | ORAL_CAPSULE | Freq: Every day | ORAL | 2 refills | Status: DC

## 2019-03-14 NOTE — Progress Notes (Signed)
Virtual Visit via Telephone Note  I connected with [name] on 03/14/19 at  9:00 AM EDT by telephone and verified that I am speaking with the correct person using two identifiers.   I discussed the limitations, risks, security and privacy concerns of performing an evaluation and management service by telephone and the availability of in person appointments. I also discussed with the patient that there may be a patient responsible charge related to this service. The patient expressed understanding and agreed to proceed.     I discussed the assessment and treatment plan with the patient. The patient was provided an opportunity to ask questions and all were answered. The patient agreed with the plan and demonstrated an understanding of the instructions.   The patient was advised to call back or seek an in-person evaluation if the symptoms worsen or if the condition fails to improve as anticipated.  I provided 15 minutes of non-face-to-face time during this encounter.   Diamond Spiller, MD  [facility] MD/PA/NP OP Progress Note  03/14/2019 9:13 AM [name]  MRN:  [id number]  Chief Complaint:  Chief Complaint    Depression; Anxiety     HPI:  This patient is a 58 year old married white female who lives with her husband in [place]. She has no children. 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  The patient was referred by her primary physician, Dr. [name], for further assessment and treatment of depression anxiety and focus problems.  The patient states that she did well most of her life until she had a stroke in 77 at age 14. She had a cerebral aneurysm that burst and she had to have a hematoma evacuated from the right frontal lobe. She has had resulting difficulties ever since and still has weakness on the left side of her body and poor fine motor skills in her left hand. She is right handed. She states that  she gets older her symptoms worsen.  The patient often feels anxious particular in crowds. She has frequent panic attacks. She takes Xanax 1 mg twice a day but is reluctant to take more. Shortly after she had her stroke she saw psychiatrist in [place] and was placed on the Xanax. Dr. [name] later put her on Effexor and she is now up to 150 mg per day. [name] not sure if it's helping. She has difficulty sleeping but trazodone helps to some degree. She also is significant problems with focus and attention span. Her thoughts ramble. She'll start one thing and go the next. She can't complete tasks. She finds this extremely frustrating. Her mood is labile at times and she'll get angry quickly and for no reason. She denies auditory or visual hallucinations or paranoia. She does not use drugs and very rarely takes a drink. She admits that time she has passive suicidal ideation but would never hurt her self because of her faith  The patient returns after 3 months.  She states that overall she is doing fairly well.  She and her husband have been working out in her yard and she enjoys this.  Her pain level is somewhat down but she has pain in both ankles and knees.  She is sleeping well sometimes up to 12 hours per night.  Her energy is pretty good.  Her mood is stable and her anxiety is under good control.  She denies any thoughts of self-harm or suicide Visit Diagnosis:  ICD-10-CM   1. Major depressive disorder, recurrent, severe without psychotic features ([place])  F33.2     Past Psychiatric History: Past outpatient treatment for depression  Past Medical History:  Past Medical History:  Diagnosis Date  . Allergy    grass, dust , mold  . Anxiety   . Arthritis   . Bipolar disorder ([place])   . Cancer (HCC)    cervical cancer  . Carpal tunnel syndrome    Bilateral  . Chest pain 09/2011   Cardiac cath-normal coronaries  . Constipation   . Depression   . Difficulty urinating 05/31/2013  . Elevated  LFTs 12/16/2013  . GERD (gastroesophageal reflux disease)   . History of kidney stones   . Hyperlipemia   . Hyperlipidemia   . Hypertension    Mild; provoked by stress and anxiety  . IBS (irritable bowel syndrome)   . Intracerebral bleed ([place])    No aneurysm; followed by Dr. Sherwood Gambler  . Loss of weight 01/06/2015  . Osteoporosis   . Stroke [facility]) 1999   hemorrhagic stroke; weakness of left side    Past Surgical History:  Procedure Laterality Date  . ABDOMINAL HYSTERECTOMY     "cancer cells"  . [name]   to remove blood clot after stroke   . CARDIAC CATHETERIZATION  2016  . CERVICAL FUSION    . CHOLECYSTECTOMY N/A 10/14/2014   Procedure: LAPAROSCOPIC CHOLECYSTECTOMY WITH INTRAOPERATIVE CHOLANGIOGRAM;  Surgeon: Jackolyn Confer, MD;  Location: [facility];  Service: General;  Laterality: N/A;  . CHONDROPLASTY Right 07/13/2017   Procedure: CHONDROPLASTY of patella;  Surgeon: Carole Civil, MD;  Location: [facility];  Service: Orthopedics;  Laterality: Right;  . EUS N/A 08/21/2015   Procedure: ESOPHAGEAL ENDOSCOPIC ULTRASOUND (EUS) RADIAL;  Surgeon: Carol Ada, MD;  Location: [facility];  Service: Endoscopy;  Laterality: N/A;  . KNEE ARTHROSCOPY WITH MEDIAL MENISECTOMY Right 07/13/2017   Procedure: KNEE ARTHROSCOPY WITH PARTIAL MEDIAL MENISECTOMY;  Surgeon: Carole Civil, MD;  Location: [facility];  Service: Orthopedics;  Laterality: Right;  . LEFT HEART CATHETERIZATION WITH CORONARY ANGIOGRAM N/A 09/23/2011   Procedure: LEFT HEART CATHETERIZATION WITH CORONARY ANGIOGRAM;  Surgeon: Thayer Headings, MD;  Location: [facility] CATH LAB;  Service: Cardiovascular;  Laterality: N/A;  . LIPOMA EXCISION Left 11/18/2013   Procedure: EXCISION OF SOFT TISSUE MASS-LEFT THIGH;  Surgeon: Jamesetta So, MD;  Location: [facility];  Service: General;  Laterality: Left;  . RECTOCELE REPAIR     x2  . RECTOCELE REPAIR N/A 04/04/2017   Procedure: POSTERIOR REPAIR (RECTOCELE);  Surgeon: Jonnie Kind, MD;   Location: [facility];  Service: Gynecology;  Laterality: N/A;    Family Psychiatric History:see below  Family History:  Family History  Problem Relation Age of Onset  . Cancer Mother        breast   . Hypertension Mother   . Hyperlipidemia Mother   . Depression Mother   . Anxiety disorder Mother   . COPD Mother   . Arthritis Mother        rheumatoid  . Drug abuse Sister   . Coronary artery disease Paternal Grandfather   . Coronary artery disease Paternal Uncle   . Depression Cousin   . Drug abuse Cousin     Social History:  Social History   Socioeconomic History  . Marital status: Married    Spouse name: Sonia Side  . Number of children: 0  . Years of education: HS  . Highest education level: Not  on file  Occupational History  . Occupation: unemployed    Comment: pending disability  Social Needs  . Financial resource strain: Not hard at all  . Food insecurity    Worry: Never true    Inability: Never true  . Transportation needs    Medical: No    Non-medical: No  Tobacco Use  . Smoking status: Former Smoker    Packs/day: 1.00    Years: 19.00    Pack years: 19.00    Types: Cigarettes    Quit date: 09/01/1997    Years since quitting: 21.5  . Smokeless tobacco: Never Used  . Tobacco comment: Quit smoking 1999 , previous 20 pack years  Substance and Sexual Activity  . Alcohol use: Yes    Comment: 1 drink every other week  . Drug use: No  . Sexual activity: Not Currently    Partners: Male    Birth control/protection: Surgical  Lifestyle  . Physical activity    Days per week: 2 days    Minutes per session: 30 min  . Stress: Very much  Relationships  . Social connections    Talks on phone: More than three times a week    Gets together: More than three times a week    Attends religious service: More than 4 times per year    Active member of club or organization: No    Attends meetings of clubs or organizations: Never    Relationship status: Married  Other Topics  Concern  . Not on file  Social History Narrative   Currently unable to work   Lives in [place]   Married   Patient drinks 1 cup of caffeine daily.   Patient is right handed.    [facility]ined the [employer] to get more exercise    Allergies:  Allergies  Allergen Reactions  . Morphine And Related Hives  . Promethazine Hcl Other (See Comments)    Causes patient to become Hyper    Metabolic Disorder Labs: Lab Results  Component Value Date   HGBA1C 5.3 06/02/2017   MPG 117 (H) 01/06/2015   MPG 117 (H) 02/17/2014   No results found for: PROLACTIN Lab Results  Component Value Date   CHOL 206 (H) 06/02/2017   TRIG 141 06/02/2017   HDL 76 06/02/2017   CHOLHDL 2.3 09/16/2015   VLDL 26 09/16/2015   LDLCALC 102 (H) 06/02/2017   LDLCALC 69 09/16/2015   Lab Results  Component Value Date   TSH 1.438 09/20/2011    Therapeutic Level Labs: No results found for: LITHIUM No results found for: VALPROATE No components found for:  CBMZ  Current Medications: Current Outpatient Medications  Medication Sig Dispense Refill  . acetaminophen (TYLENOL) 500 MG tablet Take 1,000 mg by mouth every 6 (six) hours as needed for moderate pain.     Marland Kitchen alendronate (FOSAMAX) 10 MG tablet     . ALPRAZolam (XANAX) 1 MG tablet Take 1 tablet (1 mg total) by mouth 3 (three) times daily. 90 tablet 2  . Ascorbic Acid (VITAMIN C) 1000 MG tablet Take 1,000 mg by mouth daily.    . B Complex-C (SUPER B COMPLEX PO) Take 1 tablet by mouth daily.     . diclofenac sodium (VOLTAREN) 1 % GEL Apply 2 g topically 4 (four) times daily. 100 g 0  . lisinopril (PRINIVIL,ZESTRIL) 20 MG tablet Take 20 mg by mouth daily.     . Multiple Vitamin (MULITIVITAMIN WITH MINERALS) TABS Take 1 tablet by mouth daily.    Marland Kitchen  nystatin (NYSTATIN) powder Apply 1 g topically daily.    . pantoprazole (PROTONIX) 40 MG tablet Take 40 mg by mouth daily.     . polyethylene glycol powder (GLYCOLAX/MIRALAX) powder Take 17 g by mouth daily. 3350 g 6  .  Potassium 99 MG TABS Take 99 mg by mouth daily.     . simvastatin (ZOCOR) 40 MG tablet     . traZODone (DESYREL) 150 MG tablet Take 1 tablet (150 mg total) by mouth at bedtime. 90 tablet 2  . venlafaxine XR (EFFEXOR XR) 150 MG 24 hr capsule Take 1 capsule (150 mg total) by mouth daily with breakfast. 90 capsule 2   No current facility-administered medications for this visit.      Musculoskeletal: Strength & Muscle Tone: within normal limits Gait & Station: normal Patient leans: N/A  Psychiatric Specialty Exam: Review of Systems  Musculoskeletal: Positive for joint pain.  All other systems reviewed and are negative.   There were no vitals taken for this visit.There is no height or weight on file to calculate BMI.  General Appearance: NA  Eye Contact:  NA  Speech:  Clear and Coherent  Volume:  Normal  Mood:  Euthymic  Affect:  NA  Thought Process:  Goal Directed  Orientation:  Full (Time, Place, and Person)  Thought Content: Rumination   Suicidal Thoughts:  No  Homicidal Thoughts:  No  Memory:  Immediate;   Good Recent;   Good Remote;   Fair  Judgement:  Good  Insight:  Fair  Psychomotor Activity:  Normal  Concentration:  Concentration: Good and Attention Span: Good  Recall:  Good  Fund of Knowledge: Good  Language: Good  Akathisia:  No  Handed:  Right  AIMS (if indicated): not done  Assets:  Communication Skills Desire for Improvement Resilience Social Support Talents/Skills  ADL's:  Intact  Cognition: WNL  Sleep:  Good   Screenings: MDI     Office Visit from 01/22/2016 in [facility]  Total Score (max 50)  34    Mini-Mental     Office Visit from 02/13/2015 in [facility]  Total Score (max 30 points )  26    PHQ2-9     Office Visit from 05/09/2017 in [facility] Office Visit from 09/16/2015 in [facility]  PHQ-2 Total Score  0  4  PHQ-9 Total Score  -  10    SBQ-R      Office Visit from 01/22/2016 in [facility]  SBQ-R Total Score  14.1       Assessment and Plan: This patient is a 58 year old female with a history of depression and anxiety.  She continues to do well.  She will continue on Effexor XR 150 mg daily for depression, Xanax 1 mg 3 times daily for anxiety and trazodone 150 mg at bedtime for sleep.  She will return to see me in 3 months   Diamond Spiller, MD 03/14/2019, 9:13 AM

## 2019-04-30 ENCOUNTER — Other Ambulatory Visit: Payer: No Typology Code available for payment source | Admitting: Obstetrics and Gynecology

## 2019-06-13 ENCOUNTER — Ambulatory Visit (INDEPENDENT_AMBULATORY_CARE_PROVIDER_SITE_OTHER): Payer: No Typology Code available for payment source | Admitting: Psychiatry

## 2019-06-13 ENCOUNTER — Encounter (HOSPITAL_COMMUNITY): Payer: Self-pay | Admitting: Psychiatry

## 2019-06-13 ENCOUNTER — Other Ambulatory Visit: Payer: Self-pay

## 2019-06-13 DIAGNOSIS — F332 Major depressive disorder, recurrent severe without psychotic features: Secondary | ICD-10-CM

## 2019-06-13 MED ORDER — TRAZODONE HCL 150 MG PO TABS: 150.0000 mg | ORAL_TABLET | Freq: Every day | ORAL | 2 refills | Status: DC

## 2019-06-13 MED ORDER — ALPRAZOLAM 1 MG PO TABS: 1.0000 mg | ORAL_TABLET | Freq: Three times a day (TID) | ORAL | 2 refills | Status: DC

## 2019-06-13 MED ORDER — VENLAFAXINE HCL ER 150 MG PO CP24: 150.0000 mg | ORAL_CAPSULE | Freq: Every day | ORAL | 2 refills | Status: DC

## 2019-06-13 NOTE — Progress Notes (Signed)
Virtual Visit via Video Note  I connected with [place] on 06/13/19 at 10:20 AM EST by a video enabled telemedicine application and verified that I am speaking with the correct person using two identifiers.   I discussed the limitations of evaluation and management by telemedicine and the availability of in person appointments. The patient expressed understanding and agreed to proceed.    I discussed the assessment and treatment plan with the patient. The patient was provided an opportunity to ask questions and all were answered. The patient agreed with the plan and demonstrated an understanding of the instructions.   The patient was advised to call back or seek an in-person evaluation if the symptoms worsen or if the condition fails to improve as anticipated.  I provided 15 minutes of non-face-to-face time during this encounter.   [name], MD  [facility] MD/PA/NP OP Progress Note  06/13/2019 10:52 AM [name]  MRN:  [id number]  Chief Complaint:  Chief Complaint    Depression; Anxiety; Follow-up     HPI: This patient is a 58 year old married white female who lives with her husband in [place]. She has no children. 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  The patient was referred by her primary physician, Dr. [name], for further assessment and treatment of depression anxiety and focus problems.  The patient states that she did well most of her life until she had a stroke in 40 at age 76. She had a cerebral aneurysm that burst and she had to have a hematoma evacuated from the right frontal lobe. She has had resulting difficulties ever since and still has weakness on the left side of her body and poor fine motor skills in her left hand. She is right handed. She states that she gets older her symptoms worsen.  The patient often feels anxious particular in crowds. She has frequent panic attacks. She takes Xanax 1 mg  twice a day but is reluctant to take more. Shortly after she had her stroke she saw psychiatrist in [place] and was placed on the Xanax. Dr. [name] later put her on Effexor and she is now up to 150 mg per day. [name] not sure if it's helping. She has difficulty sleeping but trazodone helps to some degree. She also is significant problems with focus and attention span. Her thoughts ramble. She'll start one thing and go the next. She can't complete tasks. She finds this extremely frustrating. Her mood is labile at times and she'll get angry quickly and for no reason. She denies auditory or visual hallucinations or paranoia. She does not use drugs and very rarely takes a drink. She admits that time she has passive suicidal ideation but would never hurt her self because of her faith  The patient returns after 3 months.  She states that some very difficult things have happened to her in the last couple of weeks.  Last week her dog escaped down the road and got killed instantly by a car.  A few days later her 25 year old stepson was killed in a motor vehicle accident.  He left behind a wife and children.  Her husband, the man's father is quite upset obviously.  The patient states that when this first happened she had some chest pain which she attributes to anxiety that has been relieved by her Xanax.  Despite these things she states that her medications have helped and she is not severely depressed.  She recently had lens replacement in one of her eyes and her vision is improving.  She is sleeping well at night and the Xanax continues to help her anxiety.  She denies suicidal ideation.  She states that despite these recent events her mood has been stable. Visit Diagnosis:    ICD-10-CM   1. Major depressive disorder, recurrent, severe without psychotic features ([place])  F33.2     Past Psychiatric History: Past outpatient treatment for depression  Past Medical History:  Past Medical History:  Diagnosis Date  .  Allergy    grass, dust , mold  . Anxiety   . Arthritis   . Bipolar disorder ([place])   . Cancer (HCC)    cervical cancer  . Carpal tunnel syndrome    Bilateral  . Chest pain 09/2011   Cardiac cath-normal coronaries  . Constipation   . Depression   . Difficulty urinating 05/31/2013  . Elevated LFTs 12/16/2013  . GERD (gastroesophageal reflux disease)   . History of kidney stones   . Hyperlipemia   . Hyperlipidemia   . Hypertension    Mild; provoked by stress and anxiety  . IBS (irritable bowel syndrome)   . Intracerebral bleed ([place])    No aneurysm; followed by Dr. Sherwood Gambler  . Loss of weight 01/06/2015  . Osteoporosis   . Stroke [facility]) 1999   hemorrhagic stroke; weakness of left side    Past Surgical History:  Procedure Laterality Date  . ABDOMINAL HYSTERECTOMY     "cancer cells"  . [place]   to remove blood clot after stroke   . CARDIAC CATHETERIZATION  2016  . CERVICAL FUSION    . CHOLECYSTECTOMY N/A 10/14/2014   Procedure: LAPAROSCOPIC CHOLECYSTECTOMY WITH INTRAOPERATIVE CHOLANGIOGRAM;  Surgeon: Jackolyn Confer, MD;  Location: [facility];  Service: General;  Laterality: N/A;  . CHONDROPLASTY Right 07/13/2017   Procedure: CHONDROPLASTY of patella;  Surgeon: Carole Civil, MD;  Location: [facility];  Service: Orthopedics;  Laterality: Right;  . EUS N/A 08/21/2015   Procedure: ESOPHAGEAL ENDOSCOPIC ULTRASOUND (EUS) RADIAL;  Surgeon: Carol Ada, MD;  Location: [facility];  Service: Endoscopy;  Laterality: N/A;  . KNEE ARTHROSCOPY WITH MEDIAL MENISECTOMY Right 07/13/2017   Procedure: KNEE ARTHROSCOPY WITH PARTIAL MEDIAL MENISECTOMY;  Surgeon: Carole Civil, MD;  Location: [facility];  Service: Orthopedics;  Laterality: Right;  . LEFT HEART CATHETERIZATION WITH CORONARY ANGIOGRAM N/A 09/23/2011   Procedure: LEFT HEART CATHETERIZATION WITH CORONARY ANGIOGRAM;  Surgeon: Thayer Headings, MD;  Location: [facility] C[facility];  Service: Cardiovascular;  Laterality: N/A;  . LIPOMA  EXCISION Left 11/18/2013   Procedure: EXCISION OF SOFT TISSUE MASS-LEFT THIGH;  Surgeon: Jamesetta So, MD;  Location: [facility];  Service: General;  Laterality: Left;  . RECTOCELE REPAIR     x2  . RECTOCELE REPAIR N/A 04/04/2017   Procedure: POSTERIOR REPAIR (RECTOCELE);  Surgeon: Jonnie Kind, MD;  Location: [facility];  Service: Gynecology;  Laterality: N/A;    Family Psychiatric History: see below  Family History:  Family History  Problem Relation Age of Onset  . Cancer Mother        breast   . Hypertension Mother   . Hyperlipidemia Mother   . Depression Mother   . Anxiety disorder Mother   . COPD Mother   . Arthritis Mother        rheumatoid  . Drug abuse Sister   . Coronary artery disease Paternal Grandfather   . Coronary artery disease Paternal  Uncle   . Depression Cousin   . Drug abuse Cousin     Social History:  Social History   Socioeconomic History  . Marital status: Married    Spouse name: Sonia Side  . Number of children: 0  . Years of education: HS  . Highest education level: Not on file  Occupational History  . Occupation: unemployed    Comment: pending disability  Tobacco Use  . Smoking status: Former Smoker    Packs/day: 1.00    Years: 19.00    Pack years: 19.00    Types: Cigarettes    Quit date: 09/01/1997    Years since quitting: 21.7  . Smokeless tobacco: Never Used  . Tobacco comment: Quit smoking 1999 , previous 20 pack years  Substance and Sexual Activity  . Alcohol use: Yes    Comment: 1 drink every other week  . Drug use: No  . Sexual activity: Not Currently    Partners: Male    Birth control/protection: Surgical  Other Topics Concern  . Not on file  Social History Narrative   Currently unable to work   Lives in [place]   Married   Patient drinks 1 cup of caffeine daily.   Patient is right handed.    Joined the [employer] to get more exercise   Social Determinants of Health   Financial Resource Strain: Low Risk   . Difficulty of Paying  Living Expenses: Not hard at all  Food Insecurity: No Food Insecurity  . Worried About Charity fundraiser in the Last Year: Never true  . Ran Out of Food in the Last Year: Never true  Transportation Needs: No Transportation Needs  . Lack of Transportation (Medical): No  . Lack of Transportation (Non-Medical): No  Physical Activity: Insufficiently Active  . Days of Exercise per Week: 2 days  . Minutes of Exercise per Session: 30 min  Stress: Stress Concern Present  . Feeling of Stress : Very much  Social Connections: Slightly Isolated  . Frequency of Communication with Friends and Family: More than three times a week  . Frequency of Social Gatherings with Friends and Family: More than three times a week  . Attends Religious Services: More than 4 times per year  . Active Member of Clubs or Organizations: No  . Attends Archivist Meetings: Never  . Marital Status: Married    Allergies:  Allergies  Allergen Reactions  . Morphine And Related Hives  . Promethazine Hcl Other (See Comments)    Causes patient to become Hyper    Metabolic Disorder Labs: Lab Results  Component Value Date   HGBA1C 5.3 06/02/2017   MPG 117 (H) 01/06/2015   MPG 117 (H) 02/17/2014   No results found for: PROLACTIN Lab Results  Component Value Date   CHOL 206 (H) 06/02/2017   TRIG 141 06/02/2017   HDL 76 06/02/2017   CHOLHDL 2.3 09/16/2015   VLDL 26 09/16/2015   LDLCALC 102 (H) 06/02/2017   LDLCALC 69 09/16/2015   Lab Results  Component Value Date   TSH 1.438 09/20/2011    Therapeutic Level Labs: No results found for: LITHIUM No results found for: VALPROATE No components found for:  CBMZ  Current Medications: Current Outpatient Medications  Medication Sig Dispense Refill  . acetaminophen (TYLENOL) 500 MG tablet Take 1,000 mg by mouth every 6 (six) hours as needed for moderate pain.     Marland Kitchen alendronate (FOSAMAX) 10 MG tablet     . ALPRAZolam Duanne Moron) 1  MG tablet Take 1 tablet (1  mg total) by mouth 3 (three) times daily. 90 tablet 2  . Ascorbic Acid (VITAMIN C) 1000 MG tablet Take 1,000 mg by mouth daily.    . B Complex-C (SUPER B COMPLEX PO) Take 1 tablet by mouth daily.     . diclofenac sodium (VOLTAREN) 1 % GEL Apply 2 g topically 4 (four) times daily. 100 g 0  . lisinopril (PRINIVIL,ZESTRIL) 20 MG tablet Take 20 mg by mouth daily.     . Multiple Vitamin (MULITIVITAMIN WITH MINERALS) TABS Take 1 tablet by mouth daily.    Marland Kitchen nystatin (NYSTATIN) powder Apply 1 g topically daily.    . pantoprazole (PROTONIX) 40 MG tablet Take 40 mg by mouth daily.     . polyethylene glycol powder (GLYCOLAX/MIRALAX) powder Take 17 g by mouth daily. 3350 g 6  . Potassium 99 MG TABS Take 99 mg by mouth daily.     . simvastatin (ZOCOR) 40 MG tablet     . traZODone (DESYREL) 150 MG tablet Take 1 tablet (150 mg total) by mouth at bedtime. 90 tablet 2  . venlafaxine XR (EFFEXOR XR) 150 MG 24 hr capsule Take 1 capsule (150 mg total) by mouth daily with breakfast. 90 capsule 2   No current facility-administered medications for this visit.     Musculoskeletal: Strength & Muscle Tone: within normal limits Gait & Station: normal Patient leans: N/A  Psychiatric Specialty Exam: Review of Systems  Respiratory: Positive for chest tightness.   Psychiatric/Behavioral: The patient is nervous/anxious.   All other systems reviewed and are negative.   There were no vitals taken for this visit.There is no height or weight on file to calculate BMI.  General Appearance: NA  Eye Contact:  NA  Speech:  Clear and Coherent  Volume:  Normal  Mood:  Euthymic  Affect:  NA  Thought Process:  Goal Directed  Orientation:  Full (Time, Place, and Person)  Thought Content: Rumination   Suicidal Thoughts:  No  Homicidal Thoughts:  No  Memory:  Immediate;   Good Recent;   Good Remote;   Fair  Judgement:  Good  Insight:  Good  Psychomotor Activity:  Normal  Concentration:  Concentration: Good and  Attention Span: Good  Recall:  Good  Fund of Knowledge: Good  Language: Good  Akathisia:  No  Handed:  Right  AIMS (if indicated): not done  Assets:  Communication Skills Desire for Improvement Resilience Social Support Talents/Skills  ADL's:  Intact  Cognition: WNL  Sleep:  Good   Screenings: MDI     Office Visit from 01/22/2016 in [facility]  Total Score (max 50)  34    Mini-Mental     Office Visit from 02/13/2015 in [facility]  Total Score (max 30 points )  26    PHQ2-9     Office Visit from 05/09/2017 in [facility] Office Visit from 09/16/2015 in [facility]  PHQ-2 Total Score  0  4  PHQ-9 Total Score  -  10    SBQ-R     Office Visit from 01/22/2016 in [facility]  SBQ-R Total Score  14.1       Assessment and Plan: This patient is a 58 year old female with a history of depression and anxiety.  Despite recent negative events she is continuing to do well on her current regimen.  She will continue Effexor XR 150 mg daily for depression,  Xanax 1 mg 3 times daily for anxiety and trazodone 150 mg at bedtime for sleep.  She will return to see me in 3 months   [name], MD 06/13/2019, 10:52 AM

## 2019-07-09 ENCOUNTER — Telehealth (HOSPITAL_COMMUNITY): Payer: Self-pay | Admitting: *Deleted

## 2019-07-09 ENCOUNTER — Other Ambulatory Visit (HOSPITAL_COMMUNITY): Payer: Self-pay | Admitting: Psychiatry

## 2019-07-09 MED ORDER — METHYLPHENIDATE HCL 10 MG PO TABS: 10.0000 mg | ORAL_TABLET | Freq: Two times a day (BID) | ORAL | 0 refills | Status: DC

## 2019-07-09 NOTE — Telephone Encounter (Signed)
She wanted to go back on ritalin, I sent it in, please schedule her in 4 weeks

## 2019-07-09 NOTE — Telephone Encounter (Signed)
Patient called requesting to speak with you. Patient ver vague about what's the concern. Patient requested only to speak with only you [phone]

## 2019-08-06 ENCOUNTER — Telehealth (HOSPITAL_COMMUNITY): Payer: Self-pay | Admitting: *Deleted

## 2019-08-06 ENCOUNTER — Other Ambulatory Visit (HOSPITAL_COMMUNITY): Payer: Self-pay | Admitting: Psychiatry

## 2019-08-06 ENCOUNTER — Ambulatory Visit (INDEPENDENT_AMBULATORY_CARE_PROVIDER_SITE_OTHER): Payer: No Typology Code available for payment source | Admitting: Psychiatry

## 2019-08-06 ENCOUNTER — Other Ambulatory Visit: Payer: Self-pay

## 2019-08-06 ENCOUNTER — Encounter (HOSPITAL_COMMUNITY): Payer: Self-pay | Admitting: Psychiatry

## 2019-08-06 DIAGNOSIS — F332 Major depressive disorder, recurrent severe without psychotic features: Secondary | ICD-10-CM

## 2019-08-06 MED ORDER — TRAZODONE HCL 150 MG PO TABS: 150.0000 mg | ORAL_TABLET | Freq: Every day | ORAL | 2 refills | Status: DC

## 2019-08-06 MED ORDER — ALPRAZOLAM 1 MG PO TABS: 1.0000 mg | ORAL_TABLET | Freq: Three times a day (TID) | ORAL | 2 refills | Status: DC

## 2019-08-06 MED ORDER — METHYLPHENIDATE HCL 10 MG PO TABS: 10.0000 mg | ORAL_TABLET | Freq: Two times a day (BID) | ORAL | 0 refills | Status: DC

## 2019-08-06 MED ORDER — METHYLPHENIDATE HCL 20 MG PO TABS: 20.0000 mg | ORAL_TABLET | Freq: Two times a day (BID) | ORAL | 0 refills | Status: DC

## 2019-08-06 MED ORDER — VENLAFAXINE HCL ER 150 MG PO CP24: 150.0000 mg | ORAL_CAPSULE | Freq: Every day | ORAL | 2 refills | Status: DC

## 2019-08-06 NOTE — Telephone Encounter (Signed)
Rx is calling with  ??'s concerning the  methylphenidate (RITALIN) 20 MG tablet Sig - Route: Take 1 tablet (20 mg total) by mouth 2 (two) times daily with breakfast and lunch  && methylphenidate (RITALIN) 10 MG tablet  Sig - Route: Take 1 tablet (10 mg total) by mouth 2 (two) times daily with breakfast and lunch.  stating the directions are the same for both

## 2019-08-06 NOTE — Telephone Encounter (Signed)
It should have been 10 mg bid for all prescriptions. I corrected it . Please let pt and pharmacy know

## 2019-08-06 NOTE — Progress Notes (Signed)
Virtual Visit via Telephone Note  I connected with Newton on 08/06/19 at  1:20 PM EST by telephone and verified that I am speaking with the correct person using two identifiers.   I discussed the limitations, risks, security and privacy concerns of performing an evaluation and management service by telephone and the availability of in person appointments. I also discussed with the patient that there may be a patient responsible charge related to this service. The patient expressed understanding and agreed to proceed.   I discussed the assessment and treatment plan with the patient. The patient was provided an opportunity to ask questions and all were answered. The patient agreed with the plan and demonstrated an understanding of the instructions.   The patient was advised to call back or seek an in-person evaluation if the symptoms worsen or if the condition fails to improve as anticipated.  I provided 15 minutes of non-face-to-face time during this encounter.   [name], MD  [facility] MD/PA/NP OP Progress Note  08/06/2019 1:37 PM LYNDSAY TALAMANTE  MRN:  [id number]  Chief Complaint:  Chief Complaint    Depression; Anxiety; ADD     HPI: This patient is a 59 year old married white female who lives with her husband in [place]. She has no children. 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  The patient was referred by her primary physician, Dr. [name], for further assessment and treatment of depression anxiety and focus problems.  The patient states that she did well most of her life until she had a stroke in 11 at age 10. She had a cerebral aneurysm that burst and she had to have a hematoma evacuated from the right frontal lobe. She has had resulting difficulties ever since and still has weakness on the left side of her body and poor fine motor skills in her left hand. She is right handed. She states that she gets older her  symptoms worsen.  The patient often feels anxious particular in crowds. She has frequent panic attacks. She takes Xanax 1 mg twice a day but is reluctant to take more. Shortly after she had her stroke she saw psychiatrist in [place] and was placed on the Xanax. Dr. [name] later put her on Effexor and she is now up to 150 mg per day. [name] not sure if it's helping. She has difficulty sleeping but trazodone helps to some degree. She also is significant problems with focus and attention span. Her thoughts ramble. She'll start one thing and go the next. She can't complete tasks. She finds this extremely frustrating. Her mood is labile at times and she'll get angry quickly and for no reason. She denies auditory or visual hallucinations or paranoia. She does not use drugs and very rarely takes a drink. She admits that time she has passive suicidal ideation but would never hurt her self because of her faith  This patient returns for follow-up after about 2 months.  I had her come back sooner because she had called about a month ago and wanted to go back on methylphenidate.  She was having trouble staying focused and trouble concentrating.  She is now taking methylphenidate 10 mg twice daily.  She states that it is helping a lot and she is able to stay on task and not forget things as easily.  She states overall her mood is good.  She is trying to get her cholesterol down by eating healthier  and walking every day.  She states that she is sleeping well and she denies any current symptoms of severe depression or anxiety or suicidal ideation.  Visit Diagnosis:    ICD-10-CM   1. Major depressive disorder, recurrent, severe without psychotic features ([place])  F33.2     Past Psychiatric History: Past outpatient treatment for depression  Past Medical History:  Past Medical History:  Diagnosis Date  . Allergy    grass, dust , mold  . Anxiety   . Arthritis   . Bipolar disorder ([place])   . Cancer (HCC)     cervical cancer  . Carpal tunnel syndrome    Bilateral  . Chest pain 09/2011   Cardiac cath-normal coronaries  . Constipation   . Depression   . Difficulty urinating 05/31/2013  . Elevated LFTs 12/16/2013  . GERD (gastroesophageal reflux disease)   . History of kidney stones   . Hyperlipemia   . Hyperlipidemia   . Hypertension    Mild; provoked by stress and anxiety  . IBS (irritable bowel syndrome)   . Intracerebral bleed ([place])    No aneurysm; followed by Dr. Sherwood Gambler  . Loss of weight 01/06/2015  . Osteoporosis   . Stroke [facility]) 1999   hemorrhagic stroke; weakness of left side    Past Surgical History:  Procedure Laterality Date  . ABDOMINAL HYSTERECTOMY     "cancer cells"  . [place]   to remove blood clot after stroke   . CARDIAC CATHETERIZATION  2016  . CERVICAL FUSION    . CHOLECYSTECTOMY N/A 10/14/2014   Procedure: LAPAROSCOPIC CHOLECYSTECTOMY WITH INTRAOPERATIVE CHOLANGIOGRAM;  Surgeon: Jackolyn Confer, MD;  Location: [facility];  Service: General;  Laterality: N/A;  . CHONDROPLASTY Right 07/13/2017   Procedure: CHONDROPLASTY of patella;  Surgeon: Carole Civil, MD;  Location: [facility];  Service: Orthopedics;  Laterality: Right;  . EUS N/A 08/21/2015   Procedure: ESOPHAGEAL ENDOSCOPIC ULTRASOUND (EUS) RADIAL;  Surgeon: Carol Ada, MD;  Location: [facility];  Service: Endoscopy;  Laterality: N/A;  . KNEE ARTHROSCOPY WITH MEDIAL MENISECTOMY Right 07/13/2017   Procedure: KNEE ARTHROSCOPY WITH PARTIAL MEDIAL MENISECTOMY;  Surgeon: Carole Civil, MD;  Location: [facility];  Service: Orthopedics;  Laterality: Right;  . LEFT HEART CATHETERIZATION WITH CORONARY ANGIOGRAM N/A 09/23/2011   Procedure: LEFT HEART CATHETERIZATION WITH CORONARY ANGIOGRAM;  Surgeon: Thayer Headings, MD;  Location: [facility] C[facility] LAB;  Service: Cardiovascular;  Laterality: N/A;  . LIPOMA EXCISION Left 11/18/2013   Procedure: EXCISION OF SOFT TISSUE MASS-LEFT THIGH;  Surgeon: Jamesetta So, MD;   Location: [facility];  Service: General;  Laterality: Left;  . RECTOCELE REPAIR     x2  . RECTOCELE REPAIR N/A 04/04/2017   Procedure: POSTERIOR REPAIR (RECTOCELE);  Surgeon: Jonnie Kind, MD;  Location: [facility];  Service: Gynecology;  Laterality: N/A;    Family Psychiatric History: see below  Family History:  Family History  Problem Relation Age of Onset  . Cancer Mother        breast   . Hypertension Mother   . Hyperlipidemia Mother   . Depression Mother   . Anxiety disorder Mother   . COPD Mother   . Arthritis Mother        rheumatoid  . Drug abuse Sister   . Coronary artery disease Paternal Grandfather   . Coronary artery disease Paternal Uncle   . Depression Cousin   . Drug abuse Cousin     Social History:  Social History  Socioeconomic History  . Marital status: Married    Spouse name: Sonia Side  . Number of children: 0  . Years of education: HS  . Highest education level: Not on file  Occupational History  . Occupation: unemployed    Comment: pending disability  Tobacco Use  . Smoking status: Former Smoker    Packs/day: 1.00    Years: 19.00    Pack years: 19.00    Types: Cigarettes    Quit date: 09/01/1997    Years since quitting: 21.9  . Smokeless tobacco: Never Used  . Tobacco comment: Quit smoking 1999 , previous 20 pack years  Substance and Sexual Activity  . Alcohol use: Yes    Comment: 1 drink every other week  . Drug use: No  . Sexual activity: Not Currently    Partners: Male    Birth control/protection: Surgical  Other Topics Concern  . Not on file  Social History Narrative   Currently unable to work   Lives in [place]   Married   Patient drinks 1 cup of caffeine daily.   Patient is right handed.    Joined the [employer] to get more exercise   Social Determinants of Health   Financial Resource Strain:   . Difficulty of Paying Living Expenses: Not on file  Food Insecurity:   . Worried About Charity fundraiser in the Last Year: Not on file  .  Ran Out of Food in the Last Year: Not on file  Transportation Needs:   . Lack of Transportation (Medical): Not on file  . Lack of Transportation (Non-Medical): Not on file  Physical Activity:   . Days of Exercise per Week: Not on file  . Minutes of Exercise per Session: Not on file  Stress:   . Feeling of Stress : Not on file  Social Connections:   . Frequency of Communication with Friends and Family: Not on file  . Frequency of Social Gatherings with Friends and Family: Not on file  . Attends Religious Services: Not on file  . Active Member of Clubs or Organizations: Not on file  . Attends Archivist Meetings: Not on file  . Marital Status: Not on file    Allergies:  Allergies  Allergen Reactions  . Morphine And Related Hives  . Promethazine Hcl Other (See Comments)    Causes patient to become Hyper    Metabolic Disorder Labs: Lab Results  Component Value Date   HGBA1C 5.3 06/02/2017   MPG 117 (H) 01/06/2015   MPG 117 (H) 02/17/2014   No results found for: PROLACTIN Lab Results  Component Value Date   CHOL 206 (H) 06/02/2017   TRIG 141 06/02/2017   HDL 76 06/02/2017   CHOLHDL 2.3 09/16/2015   VLDL 26 09/16/2015   LDLCALC 102 (H) 06/02/2017   LDLCALC 69 09/16/2015   Lab Results  Component Value Date   TSH 1.438 09/20/2011    Therapeutic Level Labs: No results found for: LITHIUM No results found for: VALPROATE No components found for:  CBMZ  Current Medications: Current Outpatient Medications  Medication Sig Dispense Refill  . acetaminophen (TYLENOL) 500 MG tablet Take 1,000 mg by mouth every 6 (six) hours as needed for moderate pain.     Marland Kitchen alendronate (FOSAMAX) 10 MG tablet     . ALPRAZolam (XANAX) 1 MG tablet Take 1 tablet (1 mg total) by mouth 3 (three) times daily. 90 tablet 2  . Ascorbic Acid (VITAMIN C) 1000 MG tablet Take 1,000  mg by mouth daily.    Marland Kitchen atorvastatin (LIPITOR) 20 MG tablet Take 20 mg by mouth daily.    . B Complex-C (SUPER B  COMPLEX PO) Take 1 tablet by mouth daily.     . diclofenac sodium (VOLTAREN) 1 % GEL Apply 2 g topically 4 (four) times daily. 100 g 0  . lisinopril (PRINIVIL,ZESTRIL) 20 MG tablet Take 20 mg by mouth daily.     . methylphenidate (RITALIN) 10 MG tablet Take 1 tablet (10 mg total) by mouth 2 (two) times daily with breakfast and lunch. 60 tablet 0  . methylphenidate (RITALIN) 10 MG tablet Take 1 tablet (10 mg total) by mouth 2 (two) times daily with breakfast and lunch. 60 tablet 0  . methylphenidate (RITALIN) 20 MG tablet Take 1 tablet (20 mg total) by mouth 2 (two) times daily with breakfast and lunch. 60 tablet 0  . Multiple Vitamin (MULITIVITAMIN WITH MINERALS) TABS Take 1 tablet by mouth daily.    . nabumetone (RELAFEN) 500 MG tablet Take 500 mg by mouth every 8 (eight) hours as needed.    . nystatin (NYSTATIN) powder Apply 1 g topically daily.    . pantoprazole (PROTONIX) 40 MG tablet Take 40 mg by mouth daily.     . polyethylene glycol powder (GLYCOLAX/MIRALAX) powder Take 17 g by mouth daily. 3350 g 6  . Potassium 99 MG TABS Take 99 mg by mouth daily.     . traZODone (DESYREL) 150 MG tablet Take 1 tablet (150 mg total) by mouth at bedtime. 90 tablet 2  . venlafaxine XR (EFFEXOR XR) 150 MG 24 hr capsule Take 1 capsule (150 mg total) by mouth daily with breakfast. 90 capsule 2   No current facility-administered medications for this visit.     Musculoskeletal: Strength & Muscle Tone: within normal limits Gait & Station: normal Patient leans: N/A  Psychiatric Specialty Exam: Review of Systems  All other systems reviewed and are negative.   There were no vitals taken for this visit.There is no height or weight on file to calculate BMI.  General Appearance: NA  Eye Contact:  NA  Speech:  Clear and Coherent  Volume:  Normal  Mood:  Euthymic  Affect:  NA  Thought Process:  Goal Directed  Orientation:  Full (Time, Place, and Person)  Thought Content: WDL   Suicidal Thoughts:  No   Homicidal Thoughts:  No  Memory:  Immediate;   Good Recent;   Good Remote;   Fair  Judgement:  Good  Insight:  Fair  Psychomotor Activity:  Normal  Concentration:  Concentration: Good and Attention Span: Good  Recall:  Good  Fund of Knowledge: Fair  Language: Good  Akathisia:  No  Handed:  Right  AIMS (if indicated): not done  Assets:  Communication Skills Desire for Improvement Resilience Social Support Talents/Skills  ADL's:  Intact  Cognition: WNL  Sleep:  Good   Screenings: MDI     Office Visit from 01/22/2016 in [facility]  Total Score (max 50)  34    Mini-Mental     Office Visit from 02/13/2015 in [facility]  Total Score (max 30 points )  26    PHQ2-9     Office Visit from 05/09/2017 in [facility] Office Visit from 09/16/2015 in [facility]  PHQ-2 Total Score  0  4  PHQ-9 Total Score  --  10    SBQ-R     Office Visit from  01/22/2016 in [facility]  SBQ-R Total Score  14.1       Assessment and Plan: This patient is a 59 year old female with a history of depression and anxiety.  She seems to be benefiting from the addition of Ritalin to help her with focus and concentration.  She will continue Ritalin 10 mg twice daily.  She will continue Effexor XR 150 mg daily for depression, Xanax 1 mg 3 times daily for anxiety and trazodone 150 mg at bedtime for sleep.  She will return to see me in 3 months   [name], MD 08/06/2019, 1:37 PM

## 2019-09-11 ENCOUNTER — Ambulatory Visit (HOSPITAL_COMMUNITY): Payer: No Typology Code available for payment source | Admitting: Psychiatry

## 2019-09-11 ENCOUNTER — Other Ambulatory Visit: Payer: Self-pay

## 2019-09-11 ENCOUNTER — Ambulatory Visit (HOSPITAL_COMMUNITY)
Admission: RE | Admit: 2019-09-11 | Discharge: 2019-09-11 | Disposition: A | Discharge status: Home / Self Care | Payer: No Typology Code available for payment source | Source: Ambulatory Visit | Attending: Internal Medicine | Admitting: Internal Medicine

## 2019-09-11 ENCOUNTER — Other Ambulatory Visit (HOSPITAL_COMMUNITY): Payer: Self-pay | Admitting: Internal Medicine

## 2019-09-11 DIAGNOSIS — R059 Cough, unspecified: Secondary | ICD-10-CM

## 2019-09-11 DIAGNOSIS — R05 Cough: Secondary | ICD-10-CM | POA: Diagnosis present

## 2019-11-04 ENCOUNTER — Encounter (HOSPITAL_COMMUNITY): Payer: Self-pay | Admitting: Psychiatry

## 2019-11-04 ENCOUNTER — Telehealth (INDEPENDENT_AMBULATORY_CARE_PROVIDER_SITE_OTHER): Payer: No Typology Code available for payment source | Admitting: Psychiatry

## 2019-11-04 ENCOUNTER — Other Ambulatory Visit: Payer: Self-pay

## 2019-11-04 DIAGNOSIS — F332 Major depressive disorder, recurrent severe without psychotic features: Secondary | ICD-10-CM | POA: Diagnosis not present

## 2019-11-04 MED ORDER — VENLAFAXINE HCL ER 150 MG PO CP24: 150.0000 mg | ORAL_CAPSULE | Freq: Every day | ORAL | 2 refills | Status: DC

## 2019-11-04 MED ORDER — METHYLPHENIDATE HCL 10 MG PO TABS: 10.0000 mg | ORAL_TABLET | Freq: Two times a day (BID) | ORAL | 0 refills | Status: DC

## 2019-11-04 MED ORDER — TRAZODONE HCL 150 MG PO TABS: 150.0000 mg | ORAL_TABLET | Freq: Every day | ORAL | 2 refills | Status: DC

## 2019-11-04 MED ORDER — ALPRAZOLAM 1 MG PO TABS: 1.0000 mg | ORAL_TABLET | Freq: Three times a day (TID) | ORAL | 2 refills | Status: DC

## 2019-11-04 NOTE — Progress Notes (Signed)
Virtual Visit via Telephone Note  I connected with [name] on 11/04/19 at  1:20 PM EDT by telephone and verified that I am speaking with the correct person using two identifiers.   I discussed the limitations, risks, security and privacy concerns of performing an evaluation and management service by telephone and the availability of in person appointments. I also discussed with the patient that there may be a patient responsible charge related to this service. The patient expressed understanding and agreed to proceed.    I discussed the assessment and treatment plan with the patient. The patient was provided an opportunity to ask questions and all were answered. The patient agreed with the plan and demonstrated an understanding of the instructions.   The patient was advised to call back or seek an in-person evaluation if the symptoms worsen or if the condition fails to improve as anticipated.  I provided 15 minutes of non-face-to-face time during this encounter.   Diamond Spiller, MD  [facility] MD/PA/NP OP Progress Note  11/04/2019 1:30 PM [name]  MRN:  [id number]  Chief Complaint:  Chief Complaint    Depression; Anxiety; Follow-up     HPI: This patient is a 59 year old married white female who lives with her husband in [place]. She has no children. 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  The patient was referred by her primary physician, Dr. Buelah Santiago, for further assessment and treatment of depression anxiety and focus problems.  The patient states that she did well most of her life until she had a stroke in 55 at age 89. She had a cerebral aneurysm that burst and she had to have a hematoma evacuated from the right frontal lobe. She has had resulting difficulties ever since and still has weakness on the left side of her body and poor fine motor skills in her left hand. She is right handed. She states that she gets older  her symptoms worsen.  The patient often feels anxious particular in crowds. She has frequent panic attacks. She takes Xanax 1 mg twice a day but is reluctant to take more. Shortly after she had her stroke she saw psychiatrist in [place] and was placed on the Xanax. Dr. Buelah Santiago later put her on Effexor and she is now up to 150 mg per day. [name] not sure if it's helping. She has difficulty sleeping but trazodone helps to some degree. She also is significant problems with focus and attention span. Her thoughts ramble. She'll start one thing and go the next. She can't complete tasks. She finds this extremely frustrating. Her mood is labile at times and she'll get angry quickly and for no reason. She denies auditory or visual hallucinations or paranoia. She does not use drugs and very rarely takes a drink. She admits that time she has passive suicidal ideation but would never hurt her self because of her faith  The patient returns for follow-up after 3 months.  She thinks that she is doing quite well.  She was coughing a lot at night and her primary doctor took her off lisinopril but then her blood pressure got out of control.  She realized the coughing is due to allergies and she is now using Flonase Allergan and a Nettie pot at night and she is no longer coughing.  She is trying to push herself to walk.  She is planning a trip to [place].  She seems very positive and upbeat and  feels like the methylphenidate has helped her energy.  She is sleeping well. Visit Diagnosis:    ICD-10-CM   1. Major depressive disorder, recurrent, severe without psychotic features ([place])  F33.2     Past Psychiatric History: Past outpatient treatment for depression  Past Medical History:  Past Medical History:  Diagnosis Date  . Allergy    grass, dust , mold  . Anxiety   . Arthritis   . Bipolar disorder ([place])   . Cancer (HCC)    cervical cancer  . Carpal tunnel syndrome    Bilateral  . Chest pain 09/2011   Cardiac  cath-normal coronaries  . Constipation   . Depression   . Difficulty urinating 05/31/2013  . Elevated LFTs 12/16/2013  . GERD (gastroesophageal reflux disease)   . History of kidney stones   . Hyperlipemia   . Hyperlipidemia   . Hypertension    Mild; provoked by stress and anxiety  . IBS (irritable bowel syndrome)   . Intracerebral bleed ([place])    No aneurysm; followed by Dr. Sherwood Santiago  . Loss of weight 01/06/2015  . Osteoporosis   . Stroke [facility]) 1999   hemorrhagic stroke; weakness of left side    Past Surgical History:  Procedure Laterality Date  . ABDOMINAL HYSTERECTOMY     "cancer cells"  . [place]   to remove blood clot after stroke   . CARDIAC CATHETERIZATION  2016  . CERVICAL FUSION    . CHOLECYSTECTOMY N/A 10/14/2014   Procedure: LAPAROSCOPIC CHOLECYSTECTOMY WITH INTRAOPERATIVE CHOLANGIOGRAM;  Surgeon: Jackolyn Confer, MD;  Location: [place];  Service: General;  Laterality: N/A;  . CHONDROPLASTY Right 07/13/2017   Procedure: CHONDROPLASTY of patella;  Surgeon: Carole Civil, MD;  [facility];  Service: Orthopedics;  Laterality: Right;  . EUS N/A 08/21/2015   Procedure: ESOPHAGEAL ENDOSCOPIC ULTRASOUND (EUS) RADIAL;  Surgeon: Carol Ada, MD;  [facility];  Service: Endoscopy;  Laterality: N/A;  . KNEE ARTHROSCOPY WITH MEDIAL MENISECTOMY Right 07/13/2017   Procedure: KNEE ARTHROSCOPY WITH PARTIAL MEDIAL MENISECTOMY;  Surgeon: Carole Civil, MD;  [facility];  Service: Orthopedics;  Laterality: Right;  . LEFT HEART CATHETERIZATION WITH CORONARY ANGIOGRAM N/A 09/23/2011   Procedure: LEFT HEART CATHETERIZATION WITH CORONARY ANGIOGRAM;  Surgeon: Thayer Headings, MD;  [facility] C[facility] LAB;  Service: Cardiovascular;  Laterality: N/A;  . LIPOMA EXCISION Left 11/18/2013   Procedure: EXCISION OF SOFT TISSUE MASS-LEFT THIGH;  Surgeon: Jamesetta So, MD;  [facility];  Service: General;  Laterality: Left;  . RECTOCELE REPAIR     x2  .  RECTOCELE REPAIR N/A 04/04/2017   Procedure: POSTERIOR REPAIR (RECTOCELE);  Surgeon: Jonnie Kind, MD;  [facility];  Service: Gynecology;  Laterality: N/A;    Family Psychiatric History: see below  Family History:  Family History  Problem Relation Age of Onset  . Cancer Mother        breast   . Hypertension Mother   . Hyperlipidemia Mother   . Depression Mother   . Anxiety disorder Mother   . COPD Mother   . Arthritis Mother        rheumatoid  . Drug abuse Sister   . Coronary artery disease Paternal Grandfather   . Coronary artery disease Paternal Uncle   . Depression Cousin   . Drug abuse Cousin     Social History:  Social History   Socioeconomic History  . Marital status: Married    Spouse name:  Sonia Side  . Number of children: 0  . Years of education: HS  . Highest education level: Not on file  Occupational History  . Occupation: unemployed    Comment: pending disability  Tobacco Use  . Smoking status: Former Smoker    Packs/day: 1.00    Years: 19.00    Pack years: 19.00    Types: Cigarettes    Quit date: 09/01/1997    Years since quitting: 22.1  . Smokeless tobacco: Never Used  . Tobacco comment: Quit smoking 1999 , previous 20 pack years  Substance and Sexual Activity  . Alcohol use: Yes    Comment: 1 drink every other week  . Drug use: No  . Sexual activity: Not Currently    Partners: Male    Birth control/protection: Surgical  Other Topics Concern  . Not on file  Social History Narrative   Currently unable to work   Lives in [place]   Married   Patient drinks 1 cup of caffeine daily.   Patient is right handed.    Joined the [employer] to get more exercise   Social Determinants of Health   Financial Resource Strain:   . Difficulty of Paying Living Expenses:   Food Insecurity:   . Worried About Charity fundraiser in the Last Year:   . Arboriculturist in the Last Year:   Transportation Needs:   . Film/video editor (Medical):   Marland Kitchen Lack of  Transportation (Non-Medical):   Physical Activity:   . Days of Exercise per Week:   . Minutes of Exercise per Session:   Stress:   . Feeling of Stress :   Social Connections:   . Frequency of Communication with Friends and Family:   . Frequency of Social Gatherings with Friends and Family:   . Attends Religious Services:   . Active Member of Clubs or Organizations:   . Attends Archivist Meetings:   Marland Kitchen Marital Status:     Allergies:  Allergies  Allergen Reactions  . Morphine And Related Hives  . Promethazine Hcl Other (See Comments)    Causes patient to become Hyper    Metabolic Disorder Labs: Lab Results  Component Value Date   HGBA1C 5.3 06/02/2017   MPG 117 (H) 01/06/2015   MPG 117 (H) 02/17/2014   No results found for: PROLACTIN Lab Results  Component Value Date   CHOL 206 (H) 06/02/2017   TRIG 141 06/02/2017   HDL 76 06/02/2017   CHOLHDL 2.3 09/16/2015   VLDL 26 09/16/2015   LDLCALC 102 (H) 06/02/2017   LDLCALC 69 09/16/2015   Lab Results  Component Value Date   TSH 1.438 09/20/2011    Therapeutic Level Labs: No results found for: LITHIUM No results found for: VALPROATE No components found for:  CBMZ  Current Medications: Current Outpatient Medications  Medication Sig Dispense Refill  . acetaminophen (TYLENOL) 500 MG tablet Take 1,000 mg by mouth every 6 (six) hours as needed for moderate pain.     Marland Kitchen alendronate (FOSAMAX) 10 MG tablet     . ALPRAZolam (XANAX) 1 MG tablet Take 1 tablet (1 mg total) by mouth 3 (three) times daily. 90 tablet 2  . Ascorbic Acid (VITAMIN C) 1000 MG tablet Take 1,000 mg by mouth daily.    Marland Kitchen atorvastatin (LIPITOR) 20 MG tablet Take 20 mg by mouth daily.    . B Complex-C (SUPER B COMPLEX PO) Take 1 tablet by mouth daily.     Marland Kitchen  diclofenac sodium (VOLTAREN) 1 % GEL Apply 2 g topically 4 (four) times daily. 100 g 0  . lisinopril (PRINIVIL,ZESTRIL) 20 MG tablet Take 20 mg by mouth daily.     . methylphenidate (RITALIN)  10 MG tablet Take 1 tablet (10 mg total) by mouth 2 (two) times daily with breakfast and lunch. 60 tablet 0  . methylphenidate (RITALIN) 10 MG tablet Take 1 tablet (10 mg total) by mouth 2 (two) times daily with breakfast and lunch. 60 tablet 0  . methylphenidate (RITALIN) 10 MG tablet Take 1 tablet (10 mg total) by mouth 2 (two) times daily with breakfast and lunch. 60 tablet 0  . Multiple Vitamin (MULITIVITAMIN WITH MINERALS) TABS Take 1 tablet by mouth daily.    . nabumetone (RELAFEN) 500 MG tablet Take 500 mg by mouth every 8 (eight) hours as needed.    . nystatin (NYSTATIN) powder Apply 1 g topically daily.    . pantoprazole (PROTONIX) 40 MG tablet Take 40 mg by mouth daily.     . polyethylene glycol powder (GLYCOLAX/MIRALAX) powder Take 17 g by mouth daily. 3350 g 6  . Potassium 99 MG TABS Take 99 mg by mouth daily.     . traZODone (DESYREL) 150 MG tablet Take 1 tablet (150 mg total) by mouth at bedtime. 90 tablet 2  . venlafaxine XR (EFFEXOR XR) 150 MG 24 hr capsule Take 1 capsule (150 mg total) by mouth daily with breakfast. 90 capsule 2   No current facility-administered medications for this visit.     Musculoskeletal: Strength & Muscle Tone: within normal limits Gait & Station: normal Patient leans: N/A  Psychiatric Specialty Exam: Review of Systems  All other systems reviewed and are negative.   There were no vitals taken for this visit.There is no height or weight on file to calculate BMI.  General Appearance: NA  Eye Contact:  NA  Speech:  Clear and Coherent  Volume:  Normal  Mood:  Euthymic  Affect:  NA  Thought Process:  Goal Directed  Orientation:  Full (Time, Place, and Person)  Thought Content: WDL   Suicidal Thoughts:  No  Homicidal Thoughts:  No  Memory:  Immediate;   Good Recent;   Good Remote;   Fair  Judgement:  Good  Insight:  Good  Psychomotor Activity:  Normal  Concentration:  Concentration: Good and Attention Span: Good  Recall:  Good  Fund of  Knowledge: Good  Language: Good  Akathisia:  No  Handed:  Right  AIMS (if indicated): not done  Assets:  Communication Skills Desire for Improvement Resilience Social Support Talents/Skills  ADL's:  Intact  Cognition: WNL  Sleep:  Good   Screenings: MDI     Office Visit from 01/22/2016 in [facility]  Total Score (max 50)  34    Mini-Mental     Office Visit from 02/13/2015 in [facility]  Total Score (max 30 points )  26    PHQ2-9     Office Visit from 05/09/2017 in [facility] Office Visit from 09/16/2015 in [facility]  PHQ-2 Total Score  0  4  PHQ-9 Total Score  --  10    SBQ-R     Office Visit from 01/22/2016 in [facility]  SBQ-R Total Score  14.1       Assessment and Plan: This patient is a 59 year old female with a history of depression and anxiety.  She is also had  a stroke in the past and is benefiting from the addition of methylphenidate for focus and concentration.  She will continue methylphenidate 10 mg twice daily.  She will also continue Effexor XR 150 mg daily for depression, Xanax 1 mg 3 times daily for anxiety and trazodone 150 mg at bedtime for sleep.  She will return to see me in 3 months   Diamond Spiller, MD 11/04/2019, 1:30 PM

## 2019-12-10 ENCOUNTER — Encounter (HOSPITAL_COMMUNITY): Payer: Self-pay | Admitting: *Deleted

## 2019-12-10 ENCOUNTER — Telehealth (HOSPITAL_COMMUNITY): Payer: Self-pay | Admitting: *Deleted

## 2019-12-10 NOTE — Telephone Encounter (Signed)
Patient called and [facility] stating she is needing refills for her Ritalin. Staff called numbers on file and [facility] for patient to please call her pharmacy. Per pt chart, there should be refill for her Ritalin at her pharmacy.

## 2019-12-23 ENCOUNTER — Other Ambulatory Visit (HOSPITAL_COMMUNITY): Payer: Self-pay | Admitting: Internal Medicine

## 2019-12-23 DIAGNOSIS — Z1231 Encounter for screening mammogram for malignant neoplasm of breast: Secondary | ICD-10-CM

## 2020-01-09 ENCOUNTER — Other Ambulatory Visit: Payer: Self-pay

## 2020-01-09 ENCOUNTER — Ambulatory Visit: Payer: No Typology Code available for payment source | Admitting: Orthopedic Surgery

## 2020-01-09 ENCOUNTER — Encounter: Payer: Self-pay | Admitting: Orthopedic Surgery

## 2020-01-09 ENCOUNTER — Ambulatory Visit: Payer: No Typology Code available for payment source

## 2020-01-09 VITALS — BP 134/78 | HR 86 | Ht 63.0 in | Wt 151.4 lb

## 2020-01-09 DIAGNOSIS — M25562 Pain in left knee: Secondary | ICD-10-CM

## 2020-01-09 DIAGNOSIS — M7051 Other bursitis of knee, right knee: Secondary | ICD-10-CM | POA: Diagnosis not present

## 2020-01-09 DIAGNOSIS — T148XXA Other injury of unspecified body region, initial encounter: Secondary | ICD-10-CM

## 2020-01-09 DIAGNOSIS — M1712 Unilateral primary osteoarthritis, left knee: Secondary | ICD-10-CM | POA: Diagnosis not present

## 2020-01-09 DIAGNOSIS — Z9889 Other specified postprocedural states: Secondary | ICD-10-CM

## 2020-01-09 NOTE — Progress Notes (Signed)
NEW PROBLEM//OFFICE VISIT  Chief Complaint  Patient presents with  . Knee Pain    Lt knee pain after fall end of May beginning of June    HPI Date under the radar 59 year old female presents with a leg injury left leg about end of May got out of the tub slipped her left leg hit the porcelain.  She experienced acute pain bruising swelling and is still having discomfort when she is walking over the left proximal tibia.  The muscle looks like it has been injured and there is an indentation there she is concerned about a fracture  She also wants an injection in her left knee and her right knee.  She had an arthroscopy of the right knee for medial meniscectomy developed some postop pes bursitis on the right   Review of Systems  HENT: Positive for tinnitus.   Cardiovascular: Positive for leg swelling.  Gastrointestinal: Positive for abdominal pain, constipation and diarrhea.  Genitourinary: Positive for frequency and urgency.  Musculoskeletal: Positive for back pain, falls, joint pain and neck pain.  Endo/Heme/Allergies: Positive for environmental allergies. Bruises/bleeds easily.  Psychiatric/Behavioral: Positive for depression and memory loss. The patient is nervous/anxious.   All other systems reviewed and are negative.    Past Medical History:  Diagnosis Date  . Allergy    grass, dust , mold  . Anxiety   . Arthritis   . Bipolar disorder ([place])   . Cancer (HCC)    cervical cancer  . Carpal tunnel syndrome    Bilateral  . Chest pain 09/2011   Cardiac cath-normal coronaries  . Constipation   . Depression   . Difficulty urinating 05/31/2013  . Elevated LFTs 12/16/2013  . GERD (gastroesophageal reflux disease)   . History of kidney stones   . Hyperlipemia   . Hyperlipidemia   . Hypertension    Mild; provoked by stress and anxiety  . IBS (irritable bowel syndrome)   . Intracerebral bleed ([place])    No aneurysm; followed by Dr. Sherwood Gambler  . Loss of weight 01/06/2015  .  Osteoporosis   . Stroke [facility]) 1999   hemorrhagic stroke; weakness of left side    Past Surgical History:  Procedure Laterality Date  . ABDOMINAL HYSTERECTOMY     "cancer cells"  . [place]   to remove blood clot after stroke   . CARDIAC CATHETERIZATION  2016  . CERVICAL FUSION    . CHOLECYSTECTOMY N/A 10/14/2014   Procedure: LAPAROSCOPIC CHOLECYSTECTOMY WITH INTRAOPERATIVE CHOLANGIOGRAM;  Surgeon: Jackolyn Confer, MD;  Location: [place];  Service: General;  Laterality: N/A;  . CHONDROPLASTY Right 07/13/2017   Procedure: CHONDROPLASTY of patella;  Surgeon: Carole Civil, MD;  [facility];  Service: Orthopedics;  Laterality: Right;  . EUS N/A 08/21/2015   Procedure: ESOPHAGEAL ENDOSCOPIC ULTRASOUND (EUS) RADIAL;  Surgeon: Carol Ada, MD;  [facility];  Service: Endoscopy;  Laterality: N/A;  . KNEE ARTHROSCOPY WITH MEDIAL MENISECTOMY Right 07/13/2017   Procedure: KNEE ARTHROSCOPY WITH PARTIAL MEDIAL MENISECTOMY;  Surgeon: Carole Civil, MD;  [facility];  Service: Orthopedics;  Laterality: Right;  . LEFT HEART CATHETERIZATION WITH CORONARY ANGIOGRAM N/A 09/23/2011   Procedure: LEFT HEART CATHETERIZATION WITH CORONARY ANGIOGRAM;  Surgeon: Thayer Headings, MD;  Location: [facility] CATH LAB;  Service: Cardiovascular;  Laterality: N/A;  . LIPOMA EXCISION Left 11/18/2013   Procedure: EXCISION OF SOFT TISSUE MASS-LEFT THIGH;  Surgeon: Jamesetta So, MD;  [facility];  Service: General;  Laterality: Left;  .  RECTOCELE REPAIR     x2  . RECTOCELE REPAIR N/A 04/04/2017   Procedure: POSTERIOR REPAIR (RECTOCELE);  Surgeon: Jonnie Kind, MD;  [facility];  Service: Gynecology;  Laterality: N/A;    Family History  Problem Relation Age of Onset  . Cancer Mother        breast   . Hypertension Mother   . Hyperlipidemia Mother   . Depression Mother   . Anxiety disorder Mother   . COPD Mother   . Arthritis Mother        rheumatoid  . Drug abuse Sister    . Coronary artery disease Paternal Grandfather   . Coronary artery disease Paternal Uncle   . Depression Cousin   . Drug abuse Cousin    Social History   Tobacco Use  . Smoking status: Former Smoker    Packs/day: 1.00    Years: 19.00    Pack years: 19.00    Types: Cigarettes    Quit date: 09/01/1997    Years since quitting: 22.3  . Smokeless tobacco: Never Used  . Tobacco comment: Quit smoking 1999 , previous 20 pack years  Vaping Use  . Vaping Use: Never used  Substance Use Topics  . Alcohol use: Yes    Comment: 1 drink every other week  . Drug use: No    Allergies  Allergen Reactions  . Morphine And Related Hives  . Promethazine Hcl Other (See Comments)    Causes patient to become Hyper    Current Meds  Medication Sig  . ALPRAZolam (XANAX) 1 MG tablet Take 1 tablet (1 mg total) by mouth 3 (three) times daily.  Marland Kitchen amLODipine (NORVASC) 5 MG tablet Take 5 mg by mouth daily.  Marland Kitchen atorvastatin (LIPITOR) 20 MG tablet Take 20 mg by mouth daily.  . B Complex-C (SUPER B COMPLEX PO) Take 1 tablet by mouth daily.   . diclofenac sodium (VOLTAREN) 1 % GEL Apply 2 g topically 4 (four) times daily.  Marland Kitchen lisinopril (PRINIVIL,ZESTRIL) 20 MG tablet Take 20 mg by mouth daily.   . methylphenidate (RITALIN) 10 MG tablet Take 1 tablet (10 mg total) by mouth 2 (two) times daily with breakfast and lunch.  . Multiple Vitamin (MULITIVITAMIN WITH MINERALS) TABS Take 1 tablet by mouth daily.  . nabumetone (RELAFEN) 500 MG tablet Take 500 mg by mouth every 8 (eight) hours as needed.  Marland Kitchen omeprazole (PRILOSEC) 40 MG capsule Take 40 mg by mouth daily.  Marland Kitchen oxybutynin (DITROPAN-XL) 10 MG 24 hr tablet Take 10 mg by mouth at bedtime.  . traZODone (DESYREL) 150 MG tablet Take 1 tablet (150 mg total) by mouth at bedtime.  Marland Kitchen venlafaxine XR (EFFEXOR XR) 150 MG 24 hr capsule Take 1 capsule (150 mg total) by mouth daily with breakfast.    BP 134/78   Pulse 86   Ht 5' 3"  (1.6 m)   Wt 151 lb 6.4 oz (68.7 kg)    BMI 26.82 kg/m   Physical Exam Constitutional:      General: She is not in acute distress.    Appearance: Normal appearance. She is normal weight. She is not ill-appearing.  Neurological:     General: No focal deficit present.     Mental Status: She is alert and oriented to person, place, and time.     Motor: No weakness.     Coordination: Coordination normal.     Gait: Gait normal.     Deep Tendon Reflexes: Reflexes normal.  Psychiatric:  Mood and Affect: Mood normal.        Behavior: Behavior normal.        Thought Content: Thought content normal.        Judgment: Judgment normal.     Right Knee Exam   Muscle Strength  The patient has normal right knee strength.  Tenderness  The patient is experiencing tenderness in the pes anserinus.  Range of Motion  The patient has normal right knee ROM.  Tests  Drawer:  Anterior - negative      Other  Erythema: absent Sensation: normal Pulse: present Swelling: none   Left Knee Exam   Muscle Strength  The patient has normal left knee strength.  Tenderness  Left knee tenderness location: Proximal left tibia.  Range of Motion  The patient has normal left knee ROM.  Tests  Drawer:  Anterior - negative       Other  Erythema: absent Scars: absent Sensation: normal Pulse: present Swelling: none  Comments:  Proximal left leg there is an indentation in the muscle dorsiflexion normal        MEDICAL DECISION MAKING  A.  Encounter Diagnoses  Name Primary?  . Acute pain of left knee Yes  . Pes anserinus bursitis of right knee   . S/P right knee arthroscopy 07/13/17   . Primary osteoarthritis of left knee   . Contusion of bone left tibia     B. DATA ANALYSED:    IMAGING: Independent interpretation of images: Internal images 3 views left knee were normal with normal alignment  Orders:   Outside records reviewed: no  C. MANAGEMENT  Patient can resume normal activity and symptomatic treatment  of the left proximal bone contusion  Inject pes bursa right knee  Procedure note right knee injection for bursitis   verbal consent was obtained to inject right knee PES BURSA  Timeout was completed to confirm the site of injection  The medications used were 40 mg of Depo-Medrol and 1% lidocaine 3 cc  Anesthesia was provided by ethyl chloride and the skin was prepped with alcohol.  After cleaning the skin with alcohol a 25-gauge needle was used to inject the right knee bursa.  There were no complications and a sterile bandage was applied    Inject left knee joint  Procedure note left knee injection   verbal consent was obtained to inject left knee joint  Timeout was completed to confirm the site of injection  The medications used were 40 mg of Depo-Medrol and 1% lidocaine 3 cc  Anesthesia was provided by ethyl chloride and the skin was prepped with alcohol.  After cleaning the skin with alcohol a 20-gauge needle was used to inject the left knee joint. There were no complications. A sterile bandage was applied.    No orders of the defined types were placed in this encounter.     Arther Abbott, MD  01/09/2020 3:32 PM

## 2020-01-09 NOTE — Patient Instructions (Signed)

## 2020-01-27 ENCOUNTER — Ambulatory Visit (HOSPITAL_COMMUNITY): Payer: No Typology Code available for payment source

## 2020-02-04 ENCOUNTER — Other Ambulatory Visit: Payer: Self-pay

## 2020-02-04 ENCOUNTER — Telehealth (INDEPENDENT_AMBULATORY_CARE_PROVIDER_SITE_OTHER): Payer: Medicare Other | Admitting: Psychiatry

## 2020-02-04 ENCOUNTER — Encounter (HOSPITAL_COMMUNITY): Payer: Self-pay | Admitting: Psychiatry

## 2020-02-04 DIAGNOSIS — R413 Other amnesia: Secondary | ICD-10-CM | POA: Diagnosis not present

## 2020-02-04 DIAGNOSIS — F332 Major depressive disorder, recurrent severe without psychotic features: Secondary | ICD-10-CM

## 2020-02-04 MED ORDER — ALPRAZOLAM 1 MG PO TABS: 1.0000 mg | ORAL_TABLET | Freq: Three times a day (TID) | ORAL | 2 refills | Status: DC

## 2020-02-04 MED ORDER — METHYLPHENIDATE HCL 10 MG PO TABS: 10.0000 mg | ORAL_TABLET | Freq: Two times a day (BID) | ORAL | 0 refills | Status: DC

## 2020-02-04 MED ORDER — TRAZODONE HCL 150 MG PO TABS: 150.0000 mg | ORAL_TABLET | Freq: Every day | ORAL | 2 refills | Status: DC

## 2020-02-04 MED ORDER — VENLAFAXINE HCL ER 150 MG PO CP24: 150.0000 mg | ORAL_CAPSULE | Freq: Every day | ORAL | 2 refills | Status: DC

## 2020-02-04 NOTE — Progress Notes (Signed)
Virtual Visit via Telephone Note  I connected with [name] on 02/04/20 at 10:40 AM EDT by telephone and verified that I am speaking with the correct person using two identifiers.   I discussed the limitations, risks, security and privacy concerns of performing an evaluation and management service by telephone and the availability of in person appointments. I also discussed with the patient that there may be a patient responsible charge related to this service. The patient expressed understanding and agreed to proceed.    I discussed the assessment and treatment plan with the patient. The patient was provided an opportunity to ask questions and all were answered. The patient agreed with the plan and demonstrated an understanding of the instructions.   The patient was advised to call back or seek an in-person evaluation if the symptoms worsen or if the condition fails to improve as anticipated.  I provided 15 minutes of non-face-to-face time during this encounter. : Provider Home, patient home  [name], MD  [facility] MD/PA/NP OP Progress Note  02/04/2020 11:05 AM [name]  MRN:  [id number]  Chief Complaint:  Chief Complaint    Anxiety; Depression; Follow-up     HPI: This patient is a 59 year old married white female who lives with her husband in [place]. She has no children. 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  The patient was referred by her primary physician, Dr. [name], for further assessment and treatment of depression anxiety and focus problems.  The patient states that she did well most of her life until she had a stroke in 28 at age 59. She had a cerebral aneurysm that burst and she had to have a hematoma evacuated from the right frontal lobe. She has had resulting difficulties ever since and still has weakness on the left side of her body and poor fine motor skills in her left hand. She is right handed.  She states that she gets older her symptoms worsen.  The patient often feels anxious particular in crowds. She has frequent panic attacks. She takes Xanax 1 mg twice a day but is reluctant to take more. Shortly after she had her stroke she saw psychiatrist in [place] and was placed on the Xanax. Dr. [name] later put her on Effexor and she is now up to 150 mg per day. [name] not sure if it's helping. She has difficulty sleeping but trazodone helps to some degree. She also is significant problems with focus and attention span. Her thoughts ramble. She'll start one thing and go the next. She can't complete tasks. She finds this extremely frustrating. Her mood is labile at times and she'll get angry quickly and for no reason. She denies auditory or visual hallucinations or paranoia. She does not use drugs and very rarely takes a drink. She admits that time she has passive suicidal ideation but would never hurt her self because of her faith  The patient returns for follow-up after 3 months.  For the most part she is doing okay.  She has gotten a new disability lawyer and this is gotten her somewhat upset.  She states she feels like she is starting over from the beginning because he is asking her all the other questions at the previous lawyer asked.  She does not think he understands her case and I encouraged her to write down everything she wants to tell her before they talk.  Otherwise her mood has been fairly stable  and she denies severe depression anxiety or insomnia. Visit Diagnosis:    ICD-10-CM   1. Major depressive disorder, recurrent, severe without psychotic features ([place])  F33.2   2. Memory deficit  R41.3     Past Psychiatric History: Past outpatient treatment for depression  Past Medical History:  Past Medical History:  Diagnosis Date  . Allergy    grass, dust , mold  . Anxiety   . Arthritis   . Bipolar disorder ([place])   . Cancer (HCC)    cervical cancer  . Carpal tunnel syndrome     Bilateral  . Chest pain 09/2011   Cardiac cath-normal coronaries  . Constipation   . Depression   . Difficulty urinating 05/31/2013  . Elevated LFTs 12/16/2013  . GERD (gastroesophageal reflux disease)   . History of kidney stones   . Hyperlipemia   . Hyperlipidemia   . Hypertension    Mild; provoked by stress and anxiety  . IBS (irritable bowel syndrome)   . Intracerebral bleed ([place])    No aneurysm; followed by Dr. Sherwood Gambler  . Loss of weight 01/06/2015  . Osteoporosis   . Stroke [facility]) 1999   hemorrhagic stroke; weakness of left side    Past Surgical History:  Procedure Laterality Date  . ABDOMINAL HYSTERECTOMY     "cancer cells"  . [place]   to remove blood clot after stroke   . CARDIAC CATHETERIZATION  2016  . CERVICAL FUSION    . CHOLECYSTECTOMY N/A 10/14/2014   Procedure: LAPAROSCOPIC CHOLECYSTECTOMY WITH INTRAOPERATIVE CHOLANGIOGRAM;  Surgeon: Jackolyn Confer, MD;  Location: [facility];  Service: General;  Laterality: N/A;  . CHONDROPLASTY Right 07/13/2017   Procedure: CHONDROPLASTY of patella;  Surgeon: Carole Civil, MD;  Location: [facility];  Service: Orthopedics;  Laterality: Right;  . EUS N/A 08/21/2015   Procedure: ESOPHAGEAL ENDOSCOPIC ULTRASOUND (EUS) RADIAL;  Surgeon: Carol Ada, MD;  Location: [facility];  Service: Endoscopy;  Laterality: N/A;  . KNEE ARTHROSCOPY WITH MEDIAL MENISECTOMY Right 07/13/2017   Procedure: KNEE ARTHROSCOPY WITH PARTIAL MEDIAL MENISECTOMY;  Surgeon: Carole Civil, MD;  Location: [facility];  Service: Orthopedics;  Laterality: Right;  . LEFT HEART CATHETERIZATION WITH CORONARY ANGIOGRAM N/A 09/23/2011   Procedure: LEFT HEART CATHETERIZATION WITH CORONARY ANGIOGRAM;  Surgeon: Thayer Headings, MD;  Location: [facility] C[facility] LAB;  Service: Cardiovascular;  Laterality: N/A;  . LIPOMA EXCISION Left 11/18/2013   Procedure: EXCISION OF SOFT TISSUE MASS-LEFT THIGH;  Surgeon: Jamesetta So, MD;  Location: [facility];  Service: General;  Laterality:  Left;  . RECTOCELE REPAIR     x2  . RECTOCELE REPAIR N/A 04/04/2017   Procedure: POSTERIOR REPAIR (RECTOCELE);  Surgeon: Jonnie Kind, MD;  Location: [facility];  Service: Gynecology;  Laterality: N/A;    Family Psychiatric History: see below  Family History:  Family History  Problem Relation Age of Onset  . Cancer Mother        breast   . Hypertension Mother   . Hyperlipidemia Mother   . Depression Mother   . Anxiety disorder Mother   . COPD Mother   . Arthritis Mother        rheumatoid  . Drug abuse Sister   . Coronary artery disease Paternal Grandfather   . Coronary artery disease Paternal Uncle   . Depression Cousin   . Drug abuse Cousin     Social History:  Social History   Socioeconomic History  . Marital status: Married  Spouse name: Sonia Side  . Number of children: 0  . Years of education: HS  . Highest education level: Not on file  Occupational History  . Occupation: unemployed    Comment: pending disability  Tobacco Use  . Smoking status: Former Smoker    Packs/day: 1.00    Years: 19.00    Pack years: 19.00    Types: Cigarettes    Quit date: 09/01/1997    Years since quitting: 22.4  . Smokeless tobacco: Never Used  . Tobacco comment: Quit smoking 1999 , previous 20 pack years  Vaping Use  . Vaping Use: Never used  Substance and Sexual Activity  . Alcohol use: Yes    Comment: 1 drink every other week  . Drug use: No  . Sexual activity: Not Currently    Partners: Male    Birth control/protection: Surgical  Other Topics Concern  . Not on file  Social History Narrative   Currently unable to work   Lives in [place]   Married   Patient drinks 1 cup of caffeine daily.   Patient is right handed.    Joined the [employer] to get more exercise   Social Determinants of Health   Financial Resource Strain:   . Difficulty of Paying Living Expenses:   Food Insecurity:   . Worried About Charity fundraiser in the Last Year:   . Arboriculturist in the Last  Year:   Transportation Needs:   . Film/video editor (Medical):   Marland Kitchen Lack of Transportation (Non-Medical):   Physical Activity:   . Days of Exercise per Week:   . Minutes of Exercise per Session:   Stress:   . Feeling of Stress :   Social Connections:   . Frequency of Communication with Friends and Family:   . Frequency of Social Gatherings with Friends and Family:   . Attends Religious Services:   . Active Member of Clubs or Organizations:   . Attends Archivist Meetings:   Marland Kitchen Marital Status:     Allergies:  Allergies  Allergen Reactions  . Morphine And Related Hives  . Promethazine Hcl Other (See Comments)    Causes patient to become Hyper    Metabolic Disorder Labs: Lab Results  Component Value Date   HGBA1C 5.3 06/02/2017   MPG 117 (H) 01/06/2015   MPG 117 (H) 02/17/2014   No results found for: PROLACTIN Lab Results  Component Value Date   CHOL 206 (H) 06/02/2017   TRIG 141 06/02/2017   HDL 76 06/02/2017   CHOLHDL 2.3 09/16/2015   VLDL 26 09/16/2015   LDLCALC 102 (H) 06/02/2017   LDLCALC 69 09/16/2015   Lab Results  Component Value Date   TSH 1.438 09/20/2011    Therapeutic Level Labs: No results found for: LITHIUM No results found for: VALPROATE No components found for:  CBMZ  Current Medications: Current Outpatient Medications  Medication Sig Dispense Refill  . acetaminophen (TYLENOL) 500 MG tablet Take 1,000 mg by mouth every 6 (six) hours as needed for moderate pain.  (Patient not taking: Reported on 01/09/2020)    . alendronate (FOSAMAX) 10 MG tablet  (Patient not taking: Reported on 01/09/2020)    . ALPRAZolam (XANAX) 1 MG tablet Take 1 tablet (1 mg total) by mouth 3 (three) times daily. 90 tablet 2  . amLODipine (NORVASC) 5 MG tablet Take 5 mg by mouth daily.    . Ascorbic Acid (VITAMIN C) 1000 MG tablet Take 1,000 mg by  mouth daily. (Patient not taking: Reported on 01/09/2020)    . atorvastatin (LIPITOR) 20 MG tablet Take 20 mg by mouth  daily.    . B Complex-C (SUPER B COMPLEX PO) Take 1 tablet by mouth daily.     . diclofenac sodium (VOLTAREN) 1 % GEL Apply 2 g topically 4 (four) times daily. 100 g 0  . lisinopril (PRINIVIL,ZESTRIL) 20 MG tablet Take 20 mg by mouth daily.     . methylphenidate (RITALIN) 10 MG tablet Take 1 tablet (10 mg total) by mouth 2 (two) times daily with breakfast and lunch. 60 tablet 0  . methylphenidate (RITALIN) 10 MG tablet Take 1 tablet (10 mg total) by mouth 2 (two) times daily with breakfast and lunch. 60 tablet 0  . methylphenidate (RITALIN) 10 MG tablet Take 1 tablet (10 mg total) by mouth 2 (two) times daily with breakfast and lunch. 60 tablet 0  . Multiple Vitamin (MULITIVITAMIN WITH MINERALS) TABS Take 1 tablet by mouth daily.    . nabumetone (RELAFEN) 500 MG tablet Take 500 mg by mouth every 8 (eight) hours as needed.    . nystatin (NYSTATIN) powder Apply 1 g topically daily. (Patient not taking: Reported on 01/09/2020)    . omeprazole (PRILOSEC) 40 MG capsule Take 40 mg by mouth daily.    Marland Kitchen oxybutynin (DITROPAN-XL) 10 MG 24 hr tablet Take 10 mg by mouth at bedtime.    . pantoprazole (PROTONIX) 40 MG tablet Take 40 mg by mouth daily.  (Patient not taking: Reported on 01/09/2020)    . polyethylene glycol powder (GLYCOLAX/MIRALAX) powder Take 17 g by mouth daily. (Patient not taking: Reported on 01/09/2020) 3350 g 6  . Potassium 99 MG TABS Take 99 mg by mouth daily.  (Patient not taking: Reported on 01/09/2020)    . traZODone (DESYREL) 150 MG tablet Take 1 tablet (150 mg total) by mouth at bedtime. 90 tablet 2  . venlafaxine XR (EFFEXOR XR) 150 MG 24 hr capsule Take 1 capsule (150 mg total) by mouth daily with breakfast. 90 capsule 2   No current facility-administered medications for this visit.     Musculoskeletal: Strength & Muscle Tone: within normal limits Gait & Station: normal Patient leans: N/A  Psychiatric Specialty Exam: Review of Systems  HENT: Positive for postnasal drip.    Respiratory: Positive for cough.   All other systems reviewed and are negative.   There were no vitals taken for this visit.There is no height or weight on file to calculate BMI.  General Appearance: NA  Eye Contact:  NA  Speech:  Clear and Coherent  Volume:  Normal  Mood:  Anxious and Euthymic  Affect:  NA  Thought Process:  Goal Directed  Orientation:  Full (Time, Place, and Person)  Thought Content: Rumination   Suicidal Thoughts:  No  Homicidal Thoughts:  No  Memory:  Immediate;   Good Recent;   Good Remote;   Fair  Judgement:  Good  Insight:  Fair  Psychomotor Activity:  Decreased  Concentration:  Concentration: Fair and Attention Span: Fair  Recall:  [employer] of Knowledge: Good  Language: Good  Akathisia:  No  Handed:  Right  AIMS (if indicated): not done  Assets:  Communication Skills Desire for Improvement Resilience Social Support Talents/Skills  ADL's:  Intact  Cognition: WNL  Sleep:  Good   Screenings: MDI     Office Visit from 01/22/2016 in [facility]  Total Score (max 50) 34  Mini-Mental     Office Visit from 02/13/2015 in [facility]  Total Score (max 30 points ) 26    PHQ2-9     Office Visit from 05/09/2017 in [facility] Office Visit from 09/16/2015 in [name]  PHQ-2 Total Score 0 4  PHQ-9 Total Score -- 10    SBQ-R     Office Visit from 01/22/2016 in [facility]  SBQ-R Total Score 14.1       Assessment and Plan: This patient is a 59 year old female with a history of depression anxiety.  She is also had a stroke in the past and is benefiting with from the addition of methylphenidate for focus and concentration.  She will continue methylphenidate 10 mg twice daily, Effexor XR 150 mg daily for depression, Xanax 1 mg 3 times daily for anxiety and trazodone 150 mg at bedtime for sleep.  She will return to see  me in 3 months   [name], MD 02/04/2020, 11:05 AM

## 2020-02-05 ENCOUNTER — Ambulatory Visit (INDEPENDENT_AMBULATORY_CARE_PROVIDER_SITE_OTHER): Payer: Medicare Other | Admitting: Allergy & Immunology

## 2020-02-05 ENCOUNTER — Other Ambulatory Visit: Payer: Self-pay

## 2020-02-05 ENCOUNTER — Encounter: Payer: Self-pay | Admitting: Allergy & Immunology

## 2020-02-05 ENCOUNTER — Ambulatory Visit (HOSPITAL_COMMUNITY)
Admission: RE | Admit: 2020-02-05 | Discharge: 2020-02-05 | Disposition: A | Discharge status: Home / Self Care | Payer: No Typology Code available for payment source | Source: Ambulatory Visit | Attending: Internal Medicine | Admitting: Internal Medicine

## 2020-02-05 VITALS — BP 122/80 | HR 75 | Temp 97.6°F | Resp 16 | Wt 148.4 lb

## 2020-02-05 DIAGNOSIS — J3089 Other allergic rhinitis: Secondary | ICD-10-CM | POA: Diagnosis not present

## 2020-02-05 DIAGNOSIS — R059 Cough, unspecified: Secondary | ICD-10-CM

## 2020-02-05 DIAGNOSIS — J302 Other seasonal allergic rhinitis: Secondary | ICD-10-CM

## 2020-02-05 DIAGNOSIS — Z1231 Encounter for screening mammogram for malignant neoplasm of breast: Secondary | ICD-10-CM | POA: Diagnosis present

## 2020-02-05 DIAGNOSIS — R05 Cough: Secondary | ICD-10-CM | POA: Diagnosis not present

## 2020-02-05 MED ORDER — AIRDUO DIGIHALER 113-14 MCG/ACT IN AEPB: 1.0000 | INHALATION_SPRAY | Freq: Two times a day (BID) | RESPIRATORY_TRACT | 3 refills | Status: DC

## 2020-02-05 MED ORDER — ALBUTEROL SULFATE HFA 108 (90 BASE) MCG/ACT IN AERS: INHALATION_SPRAY | RESPIRATORY_TRACT | 3 refills | Status: DC

## 2020-02-05 MED ORDER — AZELASTINE HCL 0.1 % NA SOLN: NASAL | 5 refills | Status: DC

## 2020-02-05 NOTE — Patient Instructions (Addendum)
1. Seasonal and perennial allergic rhinitis - Testing today showed: ragweed, indoor molds, outdoor molds, cat and dog - Copy of test results provided.  - Avoidance measures provided. - Continue with: alternating antihistamines daily and Flonase (fluticasone) one spray per nostril daily - Start taking: Astelin (azelastine) 2 sprays per nostril 1-2 times daily as needed - You can use an extra dose of the antihistamine, if needed, for breakthrough symptoms.  - Consider nasal saline rinses 1-2 times daily to remove allergens from the nasal cavities as well as help with mucous clearance (this is especially helpful to do before the nasal sprays are given) - Consider allergy shots as a means of long-term control. - Allergy shots "re-train" and "reset" the immune system to ignore environmental allergens and decrease the resulting immune response to those allergens (sneezing, itchy watery eyes, runny nose, nasal congestion, etc).    - Allergy shots improve symptoms in 75-85% of patients.  - We can discuss more at the next appointment if the medications are not working for you.  2. Cough - Lung testing looked low but it did improve with the albuterol treatment. - This points towards a diagnosis of asthma but does not necessarily prove it. - Daily controller medication(s): AirDuo 113/14 mcg one puff twice daily (sample provided) - Prior to physical activity: albuterol 2 puffs 10-15 minutes before physical activity. - Rescue medications: albuterol 4 puffs every 4-6 hours as needed - Asthma control goals:  * Full participation in all desired activities (may need albuterol before activity) * Albuterol use two time or less a week on average (not counting use with activity) * Cough interfering with sleep two time or less a month * Oral steroids no more than once a year * No hospitalizations  3. Return in about 4 weeks (around 03/04/2020). This can be an in-person, a virtual Webex or a telephone follow up  visit.   Please inform us of any Emergency Department visits, hospitalizations, or changes in symptoms. Call us before going to the ED for breathing or allergy symptoms since we might be able to fit you in for a sick visit. Feel free to contact us anytime with any questions, problems, or concerns.  It was a pleasure to meet you today!  Websites that have reliable patient information: 1. American Academy of Asthma, Allergy, and Immunology: www.aaaai.org 2. Food Allergy Research and Education (FARE): foodallergy.org 3. Mothers of Asthmatics: http://www.asthmacommunitynetwork.org 4. American College of Allergy, Asthma, and Immunology: www.acaai.org   COVID-19 Vaccine Information can be found at: [email or web address] For questions related to vaccine distribution or appointments, please email [email or web address] or call [phone].     "Like" [place] on Facebook and Instagram for our latest updates!        Make sure you are registered to vote! If you have moved or changed any of your contact information, you will need to get this updated before voting!  In some cases, you MAY be able to register to vote online: [email or web address]    Reducing Pollen Exposure  The American Academy of Allergy, Asthma and Immunology suggests the following steps to reduce your exposure to pollen during allergy seasons.    1. Do not hang sheets or clothing out to dry; pollen may collect on these items. 2. Do not mow lawns or spend time around freshly cut grass; mowing stirs up pollen. 3. Keep windows closed at night.  Keep car windows closed while driving. 4. Minimize morning activities outdoors, a time when pollen counts are usually at their  highest. 5. Stay indoors as much as possible when pollen counts or humidity is high and on windy days when pollen tends to remain in the air longer. 6. Use air conditioning when  possible.  Many air conditioners have filters that trap the pollen spores. 7. Use a HEPA room air filter to remove pollen form the indoor air you breathe.  Control of Mold Allergen   Mold and fungi can grow on a variety of surfaces provided certain temperature and moisture conditions exist.  Outdoor molds grow on plants, decaying vegetation and soil.  The major outdoor mold, Alternaria and Cladosporium, are found in very high numbers during hot and dry conditions.  Generally, a late Summer - Fall peak is seen for common outdoor fungal spores.  Rain will temporarily lower outdoor mold spore count, but counts rise rapidly when the rainy period ends.  The most important indoor molds are Aspergillus and Penicillium.  Dark, humid and poorly ventilated basements are ideal sites for mold growth.  The next most common sites of mold growth are the bathroom and the kitchen.  Outdoor (Seasonal) Mold Control  Positive outdoor molds via skin testing: Bipolaris (Helminthsporium), Drechslera (Curvalaria) and Mucor  1. Use air conditioning and keep windows closed 2. Avoid exposure to decaying vegetation. 3. Avoid leaf raking. 4. Avoid grain handling. 5. Consider wearing a face mask if working in moldy areas.  6.   Indoor (Perennial) Mold Control   Positive indoor molds via skin testing: Aspergillus, Penicillium, Fusarium, Aureobasidium (Pullulara) and Rhizopus  1. Maintain humidity below 50%. 2. Clean washable surfaces with 5% bleach solution. 3. Remove sources e.g. contaminated carpets.     Control of Dog or Cat Allergen  Avoidance is the best way to manage a dog or cat allergy. If you have a dog or cat and are allergic to dog or cats, consider removing the dog or cat from the home. If you have a dog or cat but don't want to find it a new home, or if your family wants a pet even though someone in the household is allergic, here are some strategies that may help keep symptoms at bay:  1. Keep the  pet out of your bedroom and restrict it to only a few rooms. Be advised that keeping the dog or cat in only one room will not limit the allergens to that room. 2. Don't pet, hug or kiss the dog or cat; if you do, wash your hands with soap and water. 3. High-efficiency particulate air (HEPA) cleaners run continuously in a bedroom or living room can reduce allergen levels over time. 4. Regular use of a high-efficiency vacuum cleaner or a central vacuum can reduce allergen levels. 5. Giving your dog or cat a bath at least once a week can reduce airborne allergen.  Allergy Shots   Allergies are the result of a chain reaction that starts in the immune system. Your immune system controls how your body defends itself. For instance, if you have an allergy to pollen, your immune system identifies pollen as an invader or allergen. Your immune system overreacts by producing antibodies called Immunoglobulin E (IgE). These antibodies travel to cells that release chemicals, causing an allergic reaction.  The concept behind allergy immunotherapy, whether it is received in the form of shots or tablets, is that the immune system can be desensitized to specific allergens that trigger allergy symptoms. Although it requires time and patience, the payback can be long-term relief.  How Do Allergy Shots Work?  Allergy shots work much like a vaccine. Your body responds to injected amounts of a particular allergen given in increasing doses, eventually developing a resistance and tolerance to it. Allergy shots can lead to decreased, minimal or no allergy symptoms.  There generally are two phases: build-up and maintenance. Build-up often ranges from three to six months and involves receiving injections with increasing amounts of the allergens. The shots are typically given once or twice a week, though more rapid build-up schedules are sometimes used.  The maintenance phase begins when the most effective dose is reached. This  dose is different for each person, depending on how allergic you are and your response to the build-up injections. Once the maintenance dose is reached, there are longer periods between injections, typically two to four weeks.  Occasionally doctors give cortisone-type shots that can temporarily reduce allergy symptoms. These types of shots are different and should not be confused with allergy immunotherapy shots.  Who Can Be Treated with Allergy Shots?  Allergy shots may be a good treatment approach for people with allergic rhinitis (hay fever), allergic asthma, conjunctivitis (eye allergy) or stinging insect allergy.   Before deciding to begin allergy shots, you should consider:  . The length of allergy season and the severity of your symptoms . Whether medications and/or changes to your environment can control your symptoms . Your desire to avoid long-term medication use . Time: allergy immunotherapy requires a major time commitment . Cost: may vary depending on your insurance coverage  Allergy shots for children age 51 and older are effective and often well tolerated. They might prevent the onset of new allergen sensitivities or the progression to asthma.  Allergy shots are not started on patients who are pregnant but can be continued on patients who become pregnant while receiving them. In some patients with other medical conditions or who take certain common medications, allergy shots may be of risk. It is important to mention other medications you talk to your allergist.   When Will I Feel Better?  Some may experience decreased allergy symptoms during the build-up phase. For others, it may take as long as 12 months on the maintenance dose. If there is no improvement after a year of maintenance, your allergist will discuss other treatment options with you.  If you aren't responding to allergy shots, it may be because there is not enough dose of the allergen in your vaccine or there are  missing allergens that were not identified during your allergy testing. Other reasons could be that there are high levels of the allergen in your environment or major exposure to non-allergic triggers like tobacco smoke.  What Is the Length of Treatment?  Once the maintenance dose is reached, allergy shots are generally continued for three to five years. The decision to stop should be discussed with your allergist at that time. Some people may experience a permanent reduction of allergy symptoms. Others may relapse and a longer course of allergy shots can be considered.  What Are the Possible Reactions?  The two types of adverse reactions that can occur with allergy shots are local and systemic. Common local reactions include very mild redness and swelling at the injection site, which can happen immediately or several hours after. A systemic reaction, which is less common, affects the entire body or a particular body system. They are usually mild and typically respond quickly to medications. Signs include increased allergy symptoms such as sneezing, a stuffy nose or hives.  Rarely, a serious systemic  reaction called anaphylaxis can develop. Symptoms include swelling in the throat, wheezing, a feeling of tightness in the chest, nausea or dizziness. Most serious systemic reactions develop within 30 minutes of allergy shots. This is why it is strongly recommended you wait in your doctor's office for 30 minutes after your injections. Your allergist is trained to watch for reactions, and his or her staff is trained and equipped with the proper medications to identify and treat them.  Who Should Administer Allergy Shots?  The preferred location for receiving shots is your prescribing allergist's office. Injections can sometimes be given at another facility where the physician and staff are trained to recognize and treat reactions, and have received instructions by your prescribing allergist.

## 2020-02-05 NOTE — Progress Notes (Signed)
NEW PATIENT  Date of Service/Encounter:  02/05/20  Referring provider: Celene Squibb, MD   Assessment:   Cough - ? asthma  Seasonal and perennial allergic rhinitis (ragweed, indoor molds, outdoor molds, cat and dog)  Complicated past medical history, including a stroke as well as anxiety and depression  Plan/Recommendations:   1. Seasonal and perennial allergic rhinitis - Testing today showed: ragweed, indoor molds, outdoor molds, cat and dog - Copy of test results provided.  - Avoidance measures provided. - Continue with: alternating antihistamines daily and Flonase (fluticasone) one spray per nostril daily - Start taking: Astelin (azelastine) 2 sprays per nostril 1-2 times daily as needed - You can use an extra dose of the antihistamine, if needed, for breakthrough symptoms.  - Consider nasal saline rinses 1-2 times daily to remove allergens from the nasal cavities as well as help with mucous clearance (this is especially helpful to do before the nasal sprays are given) - Consider allergy shots as a means of long-term control. - Allergy shots "re-train" and "reset" the immune system to ignore environmental allergens and decrease the resulting immune response to those allergens (sneezing, itchy watery eyes, runny nose, nasal congestion, etc).    - Allergy shots improve symptoms in 75-85% of patients.  - We can discuss more at the next appointment if the medications are not working for you.  2. Cough - Lung testing looked low but it did improve with the albuterol treatment. - This points towards a diagnosis of asthma but does not necessarily prove it. - Daily controller medication(s): AirDuo 113/14 mcg one puff twice daily (sample provided) - Prior to physical activity: albuterol 2 puffs 10-15 minutes before physical activity. - Rescue medications: albuterol 4 puffs every 4-6 hours as needed - Asthma control goals:  * Full participation in all desired activities (may need  albuterol before activity) * Albuterol use two time or less a week on average (not counting use with activity) * Cough interfering with sleep two time or less a month * Oral steroids no more than once a year * No hospitalizations  3. Return in about 4 weeks (around 03/04/2020). This can be an in-person, a virtual Webex or a telephone follow up visit.   Subjective:   [name] is a 59 y.o. female presenting today for evaluation of  Chief Complaint  Patient presents with  . Cough    [name] has a history of the following: Patient Active Problem List   Diagnosis Date Noted  . S/P right knee arthroscopy 07/13/17 07/20/2017  . Derangement of posterior horn of medial meniscus of right knee   . Chondromalacia, patella, right   . IBS (irritable bowel syndrome) 05/11/2015  . Gait instability 01/06/2015  . Memory changes 01/06/2015  . Dyspareunia, female 12/11/2014  . DDD (degenerative disc disease), lumbar 02/18/2014  . s/p hemorrhagic CVA (cerebral infarction) from "leaking BV" 11/01/2013  . Postmenopausal atrophic vaginitis 11/01/2013  . Lipoma of abdominal wall 08/14/2013  . Back pain 05/29/2013  . Paresthesia of foot 02/12/2013  . Elevated blood sugar 02/12/2013  . Hypertension   . GERD (gastroesophageal reflux disease)   . Depression 11/17/2011  . Insomnia 11/17/2011  . Hyperlipidemia 09/20/2011  . Anxiety 09/20/2011    History obtained from: chart review and patient.  [name] was referred by Celene Squibb, MD.     [name] is a 59 y.o. female presenting for an evaluation of a cough. She started having coughing at night.  She tells me that she was using a nasal saline flush. She was also using a humidifier in her room with Benadryl and fluticasone. She does have some spells of sneezing occasionally. The addition of the Benadryl and fluticasone might have helped, but she is not entirely sure. She was on Allegra but then suddenly it stopped working. She has  never used Zyrtec or Xyzal, although the Zyrtec did seem somewhat familiar to her. She reports that she gave herself allergy shots. This was from someone in [place]. This was more than 20 years ago. Symptoms have been present for over one year, but it seemed to be worse this year compared to other years.   Asthma/Respiratory Symptom History: She has never had an inhaler to her knowledge. Wheezing is much worse at night, although it seems to bother her husband more than anyone else. She has never needed prednisone and she has never gone to the hospital for her breathing. She has never seen Pulmonology. She has never been told and it has never been suggested that she has asthma.   Allergic Rhinitis Symptom History: She remains on the Flonase one spray per nostril daily. She has tried a number of antihistamines with varying degrees of success. She has not had antibiotics or prednisone for these symptoms.   She does have a history of GERD and takes omeprazole for this. She was on ranitidine, but this was stopped. She does take Tagament as needed. She does not follow with GI at this time.   She had a stroke in 1999 and has bene somewhat decapitated since that time. She has applied for disability, but was denied. This is her 2nd appeal that she is working on.   Otherwise, there is no history of other atopic diseases, including food allergies, drug allergies, stinging insect allergies, eczema, urticaria or contact dermatitis. There is no significant infectious history. Vaccinations are up to date.    Past Medical History: Patient Active Problem List   Diagnosis Date Noted  . S/P right knee arthroscopy 07/13/17 07/20/2017  . Derangement of posterior horn of medial meniscus of right knee   . Chondromalacia, patella, right   . IBS (irritable bowel syndrome) 05/11/2015  . Gait instability 01/06/2015  . Memory changes 01/06/2015  . Dyspareunia, female 12/11/2014  . DDD (degenerative disc disease),  lumbar 02/18/2014  . s/p hemorrhagic CVA (cerebral infarction) from "leaking BV" 11/01/2013  . Postmenopausal atrophic vaginitis 11/01/2013  . Lipoma of abdominal wall 08/14/2013  . Back pain 05/29/2013  . Paresthesia of foot 02/12/2013  . Elevated blood sugar 02/12/2013  . Hypertension   . GERD (gastroesophageal reflux disease)   . Depression 11/17/2011  . Insomnia 11/17/2011  . Hyperlipidemia 09/20/2011  . Anxiety 09/20/2011    Medication List:  Allergies as of 02/05/2020      Reactions   Morphine And Related Hives   Promethazine Hcl Other (See Comments)   Causes patient to become Hyper      Medication List       Accurate as of February 05, 2020 10:51 PM. If you have any questions, ask your nurse or doctor.        acetaminophen 500 MG tablet Commonly known as: TYLENOL Take 1,000 mg by mouth every 6 (six) hours as needed for moderate pain.   AirDuo Digihaler 113-14 MCG/ACT Aepb Generic drug: Fluticasone-Salmeterol(sensor) Inhale 1 puff into the lungs in the morning and at bedtime. Started by: Valentina Shaggy, MD   albuterol 108 (90 Base) MCG/ACT inhaler  Commonly known as: VENTOLIN HFA 4 puffs every 4-6 hours as needed Started by: Valentina Shaggy, MD   alendronate 10 MG tablet Commonly known as: FOSAMAX   ALPRAZolam 1 MG tablet Commonly known as: XANAX Take 1 tablet (1 mg total) by mouth 3 (three) times daily.   amLODipine 5 MG tablet Commonly known as: NORVASC Take 5 mg by mouth daily.   atorvastatin 20 MG tablet Commonly known as: LIPITOR Take 20 mg by mouth daily.   azelastine 0.1 % nasal spray Commonly known as: ASTELIN 2 sprays each nostril 1-2 times daily as needed Started by: Valentina Shaggy, MD   diclofenac sodium 1 % Gel Commonly known as: Voltaren Apply 2 g topically 4 (four) times daily.   lisinopril 20 MG tablet Commonly known as: ZESTRIL Take 20 mg by mouth daily.   methylphenidate 10 MG tablet Commonly known as:  Ritalin Take 1 tablet (10 mg total) by mouth 2 (two) times daily with breakfast and lunch.   methylphenidate 10 MG tablet Commonly known as: Ritalin Take 1 tablet (10 mg total) by mouth 2 (two) times daily with breakfast and lunch.   methylphenidate 10 MG tablet Commonly known as: Ritalin Take 1 tablet (10 mg total) by mouth 2 (two) times daily with breakfast and lunch.   multivitamin with minerals Tabs tablet Take 1 tablet by mouth daily.   nabumetone 500 MG tablet Commonly known as: RELAFEN Take 500 mg by mouth every 8 (eight) hours as needed.   nystatin powder Generic drug: nystatin Apply 1 g topically daily.   omeprazole 40 MG capsule Commonly known as: PRILOSEC Take 40 mg by mouth daily.   oxybutynin 10 MG 24 hr tablet Commonly known as: DITROPAN-XL Take 10 mg by mouth at bedtime.   pantoprazole 40 MG tablet Commonly known as: PROTONIX Take 40 mg by mouth daily.   polyethylene glycol powder 17 GM/SCOOP powder Commonly known as: GLYCOLAX/MIRALAX Take 17 g by mouth daily.   Potassium 99 MG Tabs Take 99 mg by mouth daily.   SUPER B COMPLEX PO Take 1 tablet by mouth daily.   traZODone 150 MG tablet Commonly known as: DESYREL Take 1 tablet (150 mg total) by mouth at bedtime.   venlafaxine XR 150 MG 24 hr capsule Commonly known as: Effexor XR Take 1 capsule (150 mg total) by mouth daily with breakfast.   vitamin C 1000 MG tablet Take 1,000 mg by mouth daily.       Birth History: non-contributory  Developmental History: non-contributory  Past Surgical History: Past Surgical History:  Procedure Laterality Date  . ABDOMINAL HYSTERECTOMY     "cancer cells"  . [place]   to remove blood clot after stroke   . CARDIAC CATHETERIZATION  2016  . CERVICAL FUSION    . CHOLECYSTECTOMY N/A 10/14/2014   Procedure: LAPAROSCOPIC CHOLECYSTECTOMY WITH INTRAOPERATIVE CHOLANGIOGRAM;  Surgeon: Jackolyn Confer, MD;  Location: [facility];  Service: General;   Laterality: N/A;  . CHONDROPLASTY Right 07/13/2017   Procedure: CHONDROPLASTY of patella;  Surgeon: Carole Civil, MD;  Location: [facility];  Service: Orthopedics;  Laterality: Right;  . EUS N/A 08/21/2015   Procedure: ESOPHAGEAL ENDOSCOPIC ULTRASOUND (EUS) RADIAL;  Surgeon: Carol Ada, MD;  Location: [facility];  Service: Endoscopy;  Laterality: N/A;  . KNEE ARTHROSCOPY WITH MEDIAL MENISECTOMY Right 07/13/2017   Procedure: KNEE ARTHROSCOPY WITH PARTIAL MEDIAL MENISECTOMY;  Surgeon: Carole Civil, MD;  Location: [facility];  Service: Orthopedics;  Laterality: Right;  .  LEFT HEART CATHETERIZATION WITH CORONARY ANGIOGRAM N/A 09/23/2011   Procedure: LEFT HEART CATHETERIZATION WITH CORONARY ANGIOGRAM;  Surgeon: Thayer Headings, MD;  Location: [facility];  Service: Cardiovascular;  Laterality: N/A;  . LIPOMA EXCISION Left 11/18/2013   Procedure: EXCISION OF SOFT TISSUE MASS-LEFT THIGH;  Surgeon: Jamesetta So, MD;  Location: [facility];  Service: General;  Laterality: Left;  . RECTOCELE REPAIR     x2  . RECTOCELE REPAIR N/A 04/04/2017   Procedure: POSTERIOR REPAIR (RECTOCELE);  Surgeon: Jonnie Kind, MD;  Location: [facility];  Service: Gynecology;  Laterality: N/A;     Family History: Family History  Problem Relation Age of Onset  . Cancer Mother        breast   . Hypertension Mother   . Hyperlipidemia Mother   . Depression Mother   . Anxiety disorder Mother   . COPD Mother   . Arthritis Mother        rheumatoid  . Drug abuse Sister   . Coronary artery disease Paternal Grandfather   . Coronary artery disease Paternal Uncle   . Depression Cousin   . Drug abuse Cousin      Social History: Cozy lives at home with her husband. They live in a mobile home that is 57 years old. There is carpeting throughout the home. They have gas and electric heating with central cooling. There are no indoor animals or outdoor Designer, fashion/clothing. There are no dust mite coverings on the bedding. There  is no tobacco exposure in the home at all. She is currently disabled and has applied for disability on a number of occasions without success. She is now working with a new Media planner. She is no exposed to fumes or chemicals at all. Her husband is an Clinical biochemist.    Review of Systems  Constitutional: Negative.  Negative for fever, malaise/fatigue and weight loss.  HENT: Negative.  Negative for congestion, ear discharge and ear pain.   Eyes: Negative for pain, discharge and redness.  Respiratory: Negative for cough, sputum production, shortness of breath and wheezing.   Cardiovascular: Negative.  Negative for chest pain and palpitations.  Gastrointestinal: Negative for abdominal pain, constipation, diarrhea, heartburn, nausea and vomiting.  Skin: Negative.  Negative for itching and rash.  Neurological: Negative for dizziness and headaches.  Endo/Heme/Allergies: Negative for environmental allergies. Does not bruise/bleed easily.       Objective:   Blood pressure 122/80, pulse 75, temperature 97.6 F (36.4 C), temperature source Temporal, resp. rate 16, weight 148 lb 6.4 oz (67.3 kg), SpO2 96 %. Body mass index is 26.29 kg/m.   Physical Exam:   Physical Exam Constitutional:      Appearance: She is well-developed.  HENT:     Head: Normocephalic and atraumatic.     Right Ear: Tympanic membrane, ear canal and external ear normal. No drainage, swelling or tenderness. Tympanic membrane is not injected, scarred, erythematous, retracted or bulging.     Left Ear: Tympanic membrane, ear canal and external ear normal. No drainage, swelling or tenderness. Tympanic membrane is not injected, scarred, erythematous, retracted or bulging.     Nose: No nasal deformity, septal deviation, mucosal edema or rhinorrhea.     Right Turbinates: Enlarged and swollen.     Left Turbinates: Enlarged and swollen.     Right Sinus: No maxillary sinus tenderness or frontal sinus tenderness.     Left Sinus:  No maxillary sinus tenderness or frontal sinus tenderness.     Mouth/Throat:  Mouth: Mucous membranes are not pale and not dry.     Pharynx: Uvula midline.     Comments: Cobblestoning present in the posterior oropharynx.  Eyes:     General:        Right eye: No discharge.        Left eye: No discharge.     Conjunctiva/sclera: Conjunctivae normal.     Right eye: Right conjunctiva is not injected. No chemosis.    Left eye: Left conjunctiva is not injected. No chemosis.    Pupils: Pupils are equal, round, and reactive to light.  Cardiovascular:     Rate and Rhythm: Normal rate and regular rhythm.     Heart sounds: Normal heart sounds.  Pulmonary:     Effort: Pulmonary effort is normal. No tachypnea, accessory muscle usage or respiratory distress.     Breath sounds: Normal breath sounds. No wheezing, rhonchi or rales.     Comments: Moving air well in all lung fields. No increased work of breathing noted. Chest:     Chest wall: No tenderness.  Abdominal:     Tenderness: There is no abdominal tenderness. There is no guarding or rebound.  Lymphadenopathy:     Head:     Right side of head: No submandibular, tonsillar or occipital adenopathy.     Left side of head: No submandibular, tonsillar or occipital adenopathy.     Cervical: No cervical adenopathy.  Skin:    Coloration: Skin is not pale.     Findings: No abrasion, erythema, petechiae or rash. Rash is not papular, urticarial or vesicular.  Neurological:     Mental Status: She is alert.      Diagnostic studies:    Spirometry: results abnormal (FEV1: 1.38/54%, FVC: 2.04/63%, FEV1/FVC: 68%).    Spirometry consistent with possible restrictive disease. Albuterol four puffs via MDI treatment given in clinic with significant improvement in FEV1 per ATS criteria.  Allergy Studies:     Airborne Adult Perc - 02/05/20 1506    Time Antigen Placed 1507    Allergen Manufacturer Lavella Hammock    Location Back    Number of Test 59    Panel 1  Select    1. Control-Buffer 50% Glycerol Negative    2. Control-Histamine 1 mg/ml 3+    3. Albumin saline Negative    4. [place] Negative    5. [place] Negative    6. Johnson Negative    7. Sauk Blue Negative    8. Meadow Fescue Negative    9. Perennial Rye Negative    10. Sweet Vernal Negative    11. Timothy Negative    12. Cocklebur Negative    13. Burweed Marshelder Negative    14. Ragweed, short Negative    15. Ragweed, Giant Negative    16. Plantain,  English Negative    17. Lamb's Quarters Negative    18. Sheep Sorrell Negative    19. Rough Pigweed Negative    20. Marsh Elder, Rough Negative    21. Mugwort, Common Negative    22. Ash mix Negative    23. Birch mix Negative    24. Beech American Negative    25. Box, Elder Negative    26. Cedar, red Negative    27. Cottonwood, [place] Negative    28. Elm mix Negative    29. Hickory Negative    30. Maple mix Negative    31. Oak, [place] mix Negative    32. Pecan Pollen Negative    33.  Pine mix Negative    34. Sycamore Eastern Negative    35. Bolan, Black Pollen Negative    36. Alternaria alternata Negative    37. Cladosporium Herbarum Negative    38. Aspergillus mix Negative    39. Penicillium mix Negative    40. Bipolaris sorokiniana (Helminthosporium) Negative    41. Drechslera spicifera (Curvularia) Negative    42. Mucor plumbeus Negative    43. Fusarium moniliforme Negative    44. Aureobasidium pullulans (pullulara) Negative    45. Rhizopus oryzae Negative    46. Botrytis cinera Negative    47. Epicoccum nigrum Negative    48. Phoma betae Negative    49. Candida Albicans Negative    50. Trichophyton mentagrophytes Negative    51. Mite, D Farinae  5,000 AU/ml Negative    52. Mite, D Pteronyssinus  5,000 AU/ml Negative    53. Cat Hair 10,000 BAU/ml Negative    54.  Dog Epithelia Negative    55. Mixed Feathers Negative    56. Horse Epithelia Negative    57. Cockroach, German Negative    58. Mouse  Negative    59. Tobacco Leaf Negative          Intradermal - 02/05/20 1537    Time Antigen Placed 1537    Allergen Manufacturer Lavella Hammock    Location Arm    Number of Test 15    Intradermal Select    Control Negative    Guatem[place] Negative    Johnson Negative    7 Grass Negative    Ragweed mix 1+    Weed mix Negative    Tree mix Negative    Mold 1 Negative    Mold 2 2+    Mold 3 1+    Mold 4 2+    Cat 3+    Dog 3+    Cockroach Negative    Mite mix Negative           Allergy testing results were read and interpreted by myself, documented by clinical staff.         Salvatore Marvel, MD [facility]

## 2020-02-06 ENCOUNTER — Encounter: Payer: Self-pay | Admitting: Allergy & Immunology

## 2020-03-06 ENCOUNTER — Ambulatory Visit (INDEPENDENT_AMBULATORY_CARE_PROVIDER_SITE_OTHER): Payer: No Typology Code available for payment source | Admitting: Allergy & Immunology

## 2020-03-06 ENCOUNTER — Telehealth: Payer: Self-pay

## 2020-03-06 ENCOUNTER — Encounter: Payer: Self-pay | Admitting: Allergy & Immunology

## 2020-03-06 ENCOUNTER — Other Ambulatory Visit: Payer: Self-pay

## 2020-03-06 DIAGNOSIS — R05 Cough: Secondary | ICD-10-CM

## 2020-03-06 DIAGNOSIS — R059 Cough, unspecified: Secondary | ICD-10-CM

## 2020-03-06 NOTE — Progress Notes (Signed)
RE: [name] MRN: [id number] DOB: [birth year] Date of Telemedicine Visit: 03/06/2020  Referring provider: Celene Squibb, MD Primary care provider: Celene Squibb, MD  Chief Complaint: Cough (Patient gave verbal consent to treat and bill insurance for this visit.) and Medication Management   Telemedicine Follow Up Visit via Telephone: I connected with Lianah Bagdasarian for a follow up on 03/08/20 by telephone and verified that I am speaking with the correct person using two identifiers.   I discussed the limitations, risks, security and privacy concerns of performing an evaluation and management service by telephone and the availability of in person appointments. I also discussed with the patient that there may be a patient responsible charge related to this service. The patient expressed understanding and agreed to proceed.  Patient is at home.  Provider is at the office.  Visit start time: 4:01 PM Visit end time: 4:21 PM Insurance consent/check in by: Kenton consent and medical assistant/nurse: Garlon Hatchet  History of Present Illness:  She is a 59 y.o. female, who is being followed for seasonal and perennial allergic rhinitis as well as a cough with a questionable asthma diagnosis. Her previous allergy office visit was in August 2021 with myself.  At that time, her main complaint was a cough.  We did a spirometry which was low, but it did improve with the albuterol treatment.  We decided to start Air Duo 113/14 mcg 1 puff twice daily.  We gave her a sample and also continued albuterol 2 puffs every 4-6 hours as needed.  She underwent allergy testing that was positive to ragweed, indoor and outdoor molds, cat, and dog.  We continue with alternating antihistamines as well as Flonase.  We added on Astelin 2 sprays per nostril up to twice daily.  Since last visit, she has done somewhat well.  However, she is now fighting acute pharyngitis right now. She is currently on Augmentin for this. She  was running a low grade fever. She underwent Strep testing and COVID testing. Both were negative. She was having a "swollen" throat and it is affecting the ears. She is still having the coughing spells even with the Augmentin on board. She does wake up sometimes coughing.   She has never really been using the AirDuo. She never opened the box.  It is unclear why she never actually tried to use it.  She said that her insurance never covered it, but that does not really explain why she did not try the sample he gave her.  Regardless, she never contacted [place] about the price never asked for a different medication.  He has not been using her albuterol too much.  She wants to keep it, however.  He reports that she still has 190 puffs left on her current inhaler which she failed after we saw her.  She reports some hoarseness with this as well as a lot of nighttime cough.  She is taking her omeprazole daily. She has GERD daily. She was on Protonix. She was on it but then she was changed back to omeprazole. She coughs less on the omeprazole compared to the Protonix.    Allergic rhinitis is well controlled.  She has been alternating antihistamines and continues with the Flonase.  She feels that the Astelin is providing some relief.  She is concerned about her bill from [place].  Evidently, he reported the bill was around the thousand dollars.  She is willing to pay it but it will be in small  amounts.  Otherwise, there have been no changes to her past medical history, surgical history, family history, or social history.  Assessment and Plan:  [name] is a 59 y.o. female with:  Cough - ? asthma  Seasonal and perennial allergic rhinitis (ragweed, indoor molds, outdoor molds, cat and dog)  Complicated past medical history, including a stroke as well as anxiety and depression    [name] presents for follow-up telephone visit.  It still unclear whether her cough is from asthma.  She has never given the medications any  time to work and frankly she does not seem interested in doing that.  She does not continue with the albuterol, and I am fine with that.  I did warn her that if she overuses albuterol, her lungs would not respond to it as well during times of severe distress.  Seeing as she has only used 10 puffs since I saw her a month ago, I do not think this is going to be an issue.  But I just wanted to throw that out there for her.  I still think it would be interesting if she would trying to see if the controller medication works, but she is not motivated to do that at all.  Regarding her allergies, we will continue with the no sprays and antihistamines.  She is concerned that her medicines have led her to have acute pharyngitis.  However, there is no connection with her medications and acute pharyngitis.  The inhalers can cause thrush, but this seems to be a distinct diagnosis compared to what she is experiencing from what I can gather over the phone.  Diagnostics: None.  Medication List:  Current Outpatient Medications  Medication Sig Dispense Refill  . albuterol (VENTOLIN HFA) 108 (90 Base) MCG/ACT inhaler 4 puffs every 4-6 hours as needed 18 g 3  . ALPRAZolam (XANAX) 1 MG tablet Take 1 tablet (1 mg total) by mouth 3 (three) times daily. 90 tablet 2  . amLODipine (NORVASC) 5 MG tablet Take 5 mg by mouth daily.    Marland Kitchen amoxicillin-clavulanate (AUGMENTIN) 875-125 MG tablet Take 1 tablet by mouth 2 (two) times daily.    Marland Kitchen atorvastatin (LIPITOR) 20 MG tablet Take 20 mg by mouth daily.    Marland Kitchen azelastine (ASTELIN) 0.1 % nasal spray 2 sprays each nostril 1-2 times daily as needed 30 mL 5  . B Complex-C (SUPER B COMPLEX PO) Take 1 tablet by mouth daily.     . diclofenac sodium (VOLTAREN) 1 % GEL Apply 2 g topically 4 (four) times daily. 100 g 0  . Fluticasone-Salmeterol,sensor, (AIRDUO DIGIHALER) 113-14 MCG/ACT AEPB Inhale 1 puff into the lungs in the morning and at bedtime. 1 each 3  . lisinopril (PRINIVIL,ZESTRIL) 20  MG tablet Take 20 mg by mouth daily.     . methylphenidate (RITALIN) 10 MG tablet Take 1 tablet (10 mg total) by mouth 2 (two) times daily with breakfast and lunch. 60 tablet 0  . methylphenidate (RITALIN) 10 MG tablet Take 1 tablet (10 mg total) by mouth 2 (two) times daily with breakfast and lunch. 60 tablet 0  . Multiple Vitamin (MULITIVITAMIN WITH MINERALS) TABS Take 1 tablet by mouth daily.    . nabumetone (RELAFEN) 500 MG tablet Take 500 mg by mouth every 8 (eight) hours as needed.    Marland Kitchen omeprazole (PRILOSEC) 40 MG capsule Take 40 mg by mouth daily.    Marland Kitchen oxybutynin (DITROPAN-XL) 10 MG 24 hr tablet Take 10 mg by mouth at bedtime.    Marland Kitchen  traZODone (DESYREL) 150 MG tablet Take 1 tablet (150 mg total) by mouth at bedtime. 90 tablet 2  . venlafaxine XR (EFFEXOR XR) 150 MG 24 hr capsule Take 1 capsule (150 mg total) by mouth daily with breakfast. 90 capsule 2  . acetaminophen (TYLENOL) 500 MG tablet Take 1,000 mg by mouth every 6 (six) hours as needed for moderate pain.  (Patient not taking: Reported on 01/09/2020)    . alendronate (FOSAMAX) 10 MG tablet  (Patient not taking: Reported on 01/09/2020)    . Ascorbic Acid (VITAMIN C) 1000 MG tablet Take 1,000 mg by mouth daily. (Patient not taking: Reported on 01/09/2020)    . nystatin (NYSTATIN) powder Apply 1 g topically daily. (Patient not taking: Reported on 01/09/2020)    . pantoprazole (PROTONIX) 40 MG tablet Take 40 mg by mouth daily.  (Patient not taking: Reported on 01/09/2020)    . polyethylene glycol powder (GLYCOLAX/MIRALAX) powder Take 17 g by mouth daily. (Patient not taking: Reported on 01/09/2020) 3350 g 6  . Potassium 99 MG TABS Take 99 mg by mouth daily.  (Patient not taking: Reported on 01/09/2020)     No current facility-administered medications for this visit.   Allergies: Allergies  Allergen Reactions  . Morphine And Related Hives  . Promethazine Hcl Other (See Comments)    Causes patient to become Hyper   I reviewed her past medical  history, social history, family history, and environmental history and no significant changes have been reported from previous visits.  Review of Systems  Constitutional: Negative for activity change, appetite change, chills, fatigue and fever.  HENT: Negative for congestion, postnasal drip, rhinorrhea, sinus pressure and sore throat.   Eyes: Negative for pain, discharge, redness and itching.  Respiratory: Positive for cough. Negative for shortness of breath, wheezing and stridor.   Gastrointestinal: Negative for diarrhea, nausea and vomiting.  Endocrine: Negative for cold intolerance and heat intolerance.  Musculoskeletal: Negative for arthralgias, joint swelling and myalgias.  Skin: Negative for rash.  Allergic/Immunologic: Negative for environmental allergies and food allergies.    Objective:  Physical exam not obtained as encounter was done via telephone.   Previous notes and tests were reviewed.  I discussed the assessment and treatment plan with the patient. The patient was provided an opportunity to ask questions and all were answered. The patient agreed with the plan and demonstrated an understanding of the instructions.   The patient was advised to call back or seek an in-person evaluation if the symptoms worsen or if the condition fails to improve as anticipated.  I provided 20 minutes of non-face-to-face time during this encounter.  It was my pleasure to participate in Theresa care today. Please feel free to contact me with any questions or concerns.   Sincerely,  Valentina Shaggy, MD

## 2020-03-06 NOTE — Telephone Encounter (Signed)
Pt stated that she is having some itching around the clitoral region and is on antibiotics, she stated she has tried OTC wipes and creams and they are not helping. Also stated that her kidneys are not functioning at 100% and wasn't sure if that could be part of the issue.

## 2020-03-06 NOTE — Telephone Encounter (Signed)
Pt hasn't been seen in our office in over 3 years. Attempted to call back, no answer.

## 2020-03-08 ENCOUNTER — Encounter: Payer: Self-pay | Admitting: Allergy & Immunology

## 2020-03-10 ENCOUNTER — Telehealth (HOSPITAL_COMMUNITY): Payer: Self-pay | Admitting: *Deleted

## 2020-03-10 ENCOUNTER — Other Ambulatory Visit (HOSPITAL_COMMUNITY): Payer: Self-pay | Admitting: Psychiatry

## 2020-03-10 MED ORDER — METHYLPHENIDATE HCL 20 MG PO TABS: 20.0000 mg | ORAL_TABLET | Freq: Two times a day (BID) | ORAL | 0 refills | Status: DC

## 2020-03-10 NOTE — Telephone Encounter (Signed)
Informed patient with information and she verbalized understanding.

## 2020-03-10 NOTE — Telephone Encounter (Signed)
Patient wants to know if provider can up her Ritalin dose. Per pt it is not doing anything. Per pt she do not know if she will have her insurance in November and trying to take care of things while she have insurance. Per pt if provider wants to try her with something else its fine as well.

## 2020-03-10 NOTE — Telephone Encounter (Signed)
Tell her methylphenidate 20 mg bid sent in.

## 2020-03-16 ENCOUNTER — Other Ambulatory Visit: Payer: Self-pay

## 2020-03-16 ENCOUNTER — Ambulatory Visit (INDEPENDENT_AMBULATORY_CARE_PROVIDER_SITE_OTHER): Payer: Medicare Other | Admitting: Adult Health

## 2020-03-16 ENCOUNTER — Encounter: Payer: Self-pay | Admitting: Adult Health

## 2020-03-16 VITALS — BP 124/82 | HR 84 | Ht 63.0 in | Wt 150.0 lb

## 2020-03-16 DIAGNOSIS — B3731 Acute candidiasis of vulva and vagina: Secondary | ICD-10-CM

## 2020-03-16 DIAGNOSIS — N898 Other specified noninflammatory disorders of vagina: Secondary | ICD-10-CM

## 2020-03-16 DIAGNOSIS — B373 Candidiasis of vulva and vagina: Secondary | ICD-10-CM

## 2020-03-16 LAB — POCT WET PREP (WET MOUNT)

## 2020-03-16 MED ORDER — FLUCONAZOLE 150 MG PO TABS: ORAL_TABLET | ORAL | 1 refills | Status: DC

## 2020-03-16 NOTE — Progress Notes (Signed)
  Subjective:     Patient ID: [name], female   DOB: 05-01-1961, 59 y.o.   MRN: [id number]  HPI [name] is a 59 year old white female,married, sp hysterectomy in complaining of vaginal itching.  PCP is Dr Nevada Crane.   Review of Systems +vaginal itching esp above clitoris  Was on antibiotics last week  Decreased libido,has not had sex in years  Reviewed past medical,surgical, social and family history. Reviewed medications and allergies.     Objective:   Physical Exam BP 124/82 (BP Location: Left Arm, Patient Position: Sitting, Cuff Size: Normal)   Pulse 84   Ht 5' 3"  (1.6 m)   Wt 150 lb (68 kg)   BMI 26.57 kg/m  Skin warm and dry.Pelvic: external genitalia is normal in appearance no lesions, vagina: scant discharge without odor.loss of color. Moisture and rugae,urethra has no lesions or masses noted, cervix and uterus are absent, adnexa: no masses or tenderness noted. Bladder is non tender and no masses felt. Wet prep: + for yeast and +WBCs. Examination chaperoned by Dwyane Dee LPN.     [facility] - 03/16/20 1126      Pregnancy Intention Screening   Does the patient want to become pregnant in the next year? N/A   hysterectomy   Does the patient's partner want to become pregnant in the next year? N/A    Would the patient like to discuss contraceptive options today? N/A      Contraception Wrap Up   Contraception Counseling Provided No          Assessment:     1. Vaginal itching Will rx diflucan   2. Yeast vaginitis Will rx diflucan Meds ordered this encounter  Medications  . fluconazole (DIFLUCAN) 150 MG tablet    Sig: Take 1 now and repeat 1 in 3 days    Dispense:  2 tablet    Refill:  1    Order Specific Question:   Supervising Provider    Answer:   EURE, LUTHER H [[id number]]    3. Vaginal dryness Try Luvena or replens or coconut oil for vaginal moisture     Plan:     Follow up prn

## 2020-03-20 ENCOUNTER — Other Ambulatory Visit: Payer: Self-pay | Admitting: *Deleted

## 2020-03-20 ENCOUNTER — Telehealth: Payer: Self-pay

## 2020-03-20 MED ORDER — LEVALBUTEROL TARTRATE 45 MCG/ACT IN AERO: 2.0000 | INHALATION_SPRAY | RESPIRATORY_TRACT | 1 refills | Status: DC | PRN

## 2020-03-20 NOTE — Telephone Encounter (Signed)
PA has been submitted through CoverMyMeds for Levalbuterol Inhaler for an expedited time frame and is currently pending approval or denial.

## 2020-03-20 NOTE — Telephone Encounter (Signed)
Patient called stating the Albuterol makes her jittery and heart racing. Patient feels like she can not get enough air to her lungs. Patient brought the Air Duo back in the other day because it was expensive.   Please Advise.  [employer]

## 2020-03-20 NOTE — Telephone Encounter (Signed)
New prescription has been sent in. Called patient and advised. Patient verbalized understanding and will call back if she needs any further assistance.

## 2020-03-20 NOTE — Telephone Encounter (Signed)
We can change her to Xopenex. I don't think her insurance is great, so it might be expensive. I would tell her to check on the price before picking it up. Please send in Xopenex 2 puffs every 4-6 hours as needed.  Salvatore Marvel, MD [facility]

## 2020-03-23 NOTE — Telephone Encounter (Signed)
PA has been approved for Levalbuterol Inhaler. PA approval form has been faxed to patient's pharmacy, labeled, and placed in bulk scanning.

## 2020-03-24 ENCOUNTER — Telehealth: Payer: Self-pay | Admitting: Orthopedic Surgery

## 2020-03-24 NOTE — Telephone Encounter (Signed)
Patient called, requested to verify date of her surgery done by Dr Aline Brochure; states working on disability. Forms/requests may follow. Aware signed release needed if records are requested.

## 2020-04-08 ENCOUNTER — Other Ambulatory Visit: Payer: Self-pay

## 2020-04-08 ENCOUNTER — Telehealth (INDEPENDENT_AMBULATORY_CARE_PROVIDER_SITE_OTHER): Payer: Medicare Other | Admitting: Psychiatry

## 2020-04-08 ENCOUNTER — Encounter (HOSPITAL_COMMUNITY): Payer: Self-pay | Admitting: Psychiatry

## 2020-04-08 DIAGNOSIS — F332 Major depressive disorder, recurrent severe without psychotic features: Secondary | ICD-10-CM | POA: Diagnosis not present

## 2020-04-08 MED ORDER — METHYLPHENIDATE HCL 20 MG PO TABS: 20.0000 mg | ORAL_TABLET | Freq: Two times a day (BID) | ORAL | 0 refills | Status: DC

## 2020-04-08 MED ORDER — TRAZODONE HCL 150 MG PO TABS: 150.0000 mg | ORAL_TABLET | Freq: Every day | ORAL | 2 refills | Status: DC

## 2020-04-08 MED ORDER — VENLAFAXINE HCL ER 150 MG PO CP24: 150.0000 mg | ORAL_CAPSULE | Freq: Every day | ORAL | 2 refills | Status: DC

## 2020-04-08 MED ORDER — ALPRAZOLAM 1 MG PO TABS: 1.0000 mg | ORAL_TABLET | Freq: Three times a day (TID) | ORAL | 2 refills | Status: DC

## 2020-04-08 NOTE — Progress Notes (Signed)
Virtual Visit via Telephone Note  I connected with [name] on 04/08/20 at  1:20 PM EDT by telephone and verified that I am speaking with the correct person using two identifiers.   I discussed the limitations, risks, security and privacy concerns of performing an evaluation and management service by telephone and the availability of in person appointments. I also discussed with the patient that there may be a patient responsible charge related to this service. The patient expressed understanding and agreed to proceed.     I discussed the assessment and treatment plan with the patient. The patient was provided an opportunity to ask questions and all were answered. The patient agreed with the plan and demonstrated an understanding of the instructions.   The patient was advised to call back or seek an in-person evaluation if the symptoms worsen or if the condition fails to improve as anticipated.  I provided 15 minutes of non-face-to-face time during this encounter. Location: Provider Home, patient home  Diamond Spiller, MD  [facility] MD/PA/NP OP Progress Note  04/08/2020 1:44 PM [name]  MRN:  [id number]  Chief Complaint:  Chief Complaint    Depression; Anxiety; Follow-up     HPI: This patient is a 59 year old married white female who lives with her husband in [place]. She has no children. 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  The patient was referred by her primary physician, Dr. [name], for further assessment and treatment of depression anxiety and focus problems.  The patient states that she did well most of her life until she had a stroke in 25 at age 59. She had a cerebral aneurysm that burst and she had to have a hematoma evacuated from the right frontal lobe. She has had resulting difficulties ever since and still has weakness on the left side of her body and poor fine motor skills in her left hand. She is  right handed. She states that she gets older her symptoms worsen.  The patient often feels anxious particular in crowds. She has frequent panic attacks. She takes Xanax 1 mg twice a day but is reluctant to take more. Shortly after she had her stroke she saw psychiatrist in [place] and was placed on the Xanax. Dr. [name] later put her on Effexor and she is now up to 150 mg per day. [name] not sure if it's helping. She has difficulty sleeping but trazodone helps to some degree. She also is significant problems with focus and attention span. Her thoughts ramble. She'll start one thing and go the next. She can't complete tasks. She finds this extremely frustrating. Her mood is labile at times and she'll get angry quickly and for no reason. She denies auditory or visual hallucinations or paranoia. She does not use drugs and very rarely takes a drink. She admits that time she has passive suicidal ideation but would never hurt her self because of her faith  The patient returns for follow-up after 3 months.  She states that she is actually doing fairly well.  She finally got a disability settlement.  It was not as much as she was hoping for but now she is worried about money getting tax.  Overall however she states her mood has been good she is sleeping well.  She had called a few weeks ago stating that her methylphenidate was not working as well so I increased it to 20 mg twice daily and now she is focusing  better and has more energy.  She denies serious depression or suicidal ideation or anxiety. Visit Diagnosis:    ICD-10-CM   1. Major depressive disorder, recurrent, severe without psychotic features ([place])  F33.2     Past Psychiatric History: Past outpatient treatment for depression  Past Medical History:  Past Medical History:  Diagnosis Date  . Allergy    grass, dust , mold  . Anxiety   . Arthritis   . Bipolar disorder ([place])   . Cancer (HCC)    cervical cancer  . Carpal tunnel syndrome     Bilateral  . Chest pain 09/2011   Cardiac cath-normal coronaries  . Constipation   . Depression   . Difficulty urinating 05/31/2013  . Elevated LFTs 12/16/2013  . GERD (gastroesophageal reflux disease)   . History of kidney stones   . Hyperlipemia   . Hyperlipidemia   . Hypertension    Mild; provoked by stress and anxiety  . IBS (irritable bowel syndrome)   . Intracerebral bleed ([place])    No aneurysm; followed by Dr. Sherwood Gambler  . Loss of weight 01/06/2015  . Osteoporosis   . Stroke [facility]) 1999   hemorrhagic stroke; weakness of left side    Past Surgical History:  Procedure Laterality Date  . ABDOMINAL HYSTERECTOMY     "cancer cells"  . [place]   to remove blood clot after stroke   . CARDIAC CATHETERIZATION  2016  . CERVICAL FUSION    . CHOLECYSTECTOMY N/A 10/14/2014   Procedure: LAPAROSCOPIC CHOLECYSTECTOMY WITH INTRAOPERATIVE CHOLANGIOGRAM;  Surgeon: Jackolyn Confer, MD;  Location: [place];  Service: General;  Laterality: N/A;  . CHONDROPLASTY Right 07/13/2017   Procedure: CHONDROPLASTY of patella;  Surgeon: Carole Civil, MD;  Location: [facility];  Service: Orthopedics;  Laterality: Right;  . EUS N/A 08/21/2015   Procedure: ESOPHAGEAL ENDOSCOPIC ULTRASOUND (EUS) RADIAL;  Surgeon: Carol Ada, MD;  Location: [facility];  Service: Endoscopy;  Laterality: N/A;  . KNEE ARTHROSCOPY WITH MEDIAL MENISECTOMY Right 07/13/2017   Procedure: KNEE ARTHROSCOPY WITH PARTIAL MEDIAL MENISECTOMY;  Surgeon: Carole Civil, MD;  Location: [facility];  Service: Orthopedics;  Laterality: Right;  . LEFT HEART CATHETERIZATION WITH CORONARY ANGIOGRAM N/A 09/23/2011   Procedure: LEFT HEART CATHETERIZATION WITH CORONARY ANGIOGRAM;  Surgeon: Thayer Headings, MD;  Location: [name] C[facility] LAB;  Service: Cardiovascular;  Laterality: N/A;  . LIPOMA EXCISION Left 11/18/2013   Procedure: EXCISION OF SOFT TISSUE MASS-LEFT THIGH;  Surgeon: Jamesetta So, MD;  Location: [facility];  Service: General;  Laterality:  Left;  . RECTOCELE REPAIR     x2  . RECTOCELE REPAIR N/A 04/04/2017   Procedure: POSTERIOR REPAIR (RECTOCELE);  Surgeon: Jonnie Kind, MD;  Location: [facility];  Service: Gynecology;  Laterality: N/A;    Family Psychiatric History: see below  Family History:  Family History  Problem Relation Age of Onset  . Cancer Mother        breast   . Hypertension Mother   . Hyperlipidemia Mother   . Depression Mother   . Anxiety disorder Mother   . COPD Mother   . Arthritis Mother        rheumatoid  . Drug abuse Sister   . Coronary artery disease Paternal Grandfather   . Coronary artery disease Paternal Uncle   . Depression Cousin   . Drug abuse Cousin     Social History:  Social History   Socioeconomic History  . Marital status: Married  Spouse name: Sonia Side  . Number of children: 0  . Years of education: HS  . Highest education level: Not on file  Occupational History  . Occupation: unemployed    Comment: pending disability  Tobacco Use  . Smoking status: Former Smoker    Packs/day: 1.00    Years: 19.00    Pack years: 19.00    Types: Cigarettes    Quit date: 09/01/1997    Years since quitting: 22.6  . Smokeless tobacco: Never Used  . Tobacco comment: Quit smoking 1999 , previous 20 pack years  Vaping Use  . Vaping Use: Never used  Substance and Sexual Activity  . Alcohol use: Yes    Comment: 1 drink every other week  . Drug use: No  . Sexual activity: Not Currently    Partners: Male    Birth control/protection: Surgical    Comment: hyst   Other Topics Concern  . Not on file  Social History Narrative   Currently unable to work   Lives in [place]   Married   Patient drinks 1 cup of caffeine daily.   Patient is right handed.    Joined the [employer] to get more exercise   Social Determinants of Health   Financial Resource Strain:   . Difficulty of Paying Living Expenses: Not on file  Food Insecurity:   . Worried About Charity fundraiser in the Last Year: Not on  file  . Ran Out of Food in the Last Year: Not on file  Transportation Needs:   . Lack of Transportation (Medical): Not on file  . Lack of Transportation (Non-Medical): Not on file  Physical Activity:   . Days of Exercise per Week: Not on file  . Minutes of Exercise per Session: Not on file  Stress:   . Feeling of Stress : Not on file  Social Connections:   . Frequency of Communication with Friends and Family: Not on file  . Frequency of Social Gatherings with Friends and Family: Not on file  . Attends Religious Services: Not on file  . Active Member of Clubs or Organizations: Not on file  . Attends Archivist Meetings: Not on file  . Marital Status: Not on file    Allergies:  Allergies  Allergen Reactions  . Morphine And Related Hives  . Promethazine Hcl Other (See Comments)    Causes patient to become Hyper    Metabolic Disorder Labs: Lab Results  Component Value Date   HGBA1C 5.3 06/02/2017   MPG 117 (H) 01/06/2015   MPG 117 (H) 02/17/2014   No results found for: PROLACTIN Lab Results  Component Value Date   CHOL 206 (H) 06/02/2017   TRIG 141 06/02/2017   HDL 76 06/02/2017   CHOLHDL 2.3 09/16/2015   VLDL 26 09/16/2015   LDLCALC 102 (H) 06/02/2017   LDLCALC 69 09/16/2015   Lab Results  Component Value Date   TSH 1.438 09/20/2011    Therapeutic Level Labs: No results found for: LITHIUM No results found for: VALPROATE No components found for:  CBMZ  Current Medications: Current Outpatient Medications  Medication Sig Dispense Refill  . acetaminophen (TYLENOL) 500 MG tablet Take 1,000 mg by mouth every 6 (six) hours as needed for moderate pain.     Marland Kitchen albuterol (VENTOLIN HFA) 108 (90 Base) MCG/ACT inhaler 4 puffs every 4-6 hours as needed 18 g 3  . ALPRAZolam (XANAX) 1 MG tablet Take 1 tablet (1 mg total) by mouth 3 (three) times  daily. 90 tablet 2  . amLODipine (NORVASC) 5 MG tablet Take 5 mg by mouth daily.    Marland Kitchen atorvastatin (LIPITOR) 20 MG tablet  Take 20 mg by mouth daily.    Marland Kitchen azelastine (ASTELIN) 0.1 % nasal spray 2 sprays each nostril 1-2 times daily as needed 30 mL 5  . B Complex-C (SUPER B COMPLEX PO) Take 1 tablet by mouth daily.     . diclofenac sodium (VOLTAREN) 1 % GEL Apply 2 g topically 4 (four) times daily. 100 g 0  . fluconazole (DIFLUCAN) 150 MG tablet Take 1 now and repeat 1 in 3 days 2 tablet 1  . Fluticasone-Salmeterol,sensor, (AIRDUO DIGIHALER) 113-14 MCG/ACT AEPB Inhale 1 puff into the lungs in the morning and at bedtime. 1 each 3  . levalbuterol (XOPENEX HFA) 45 MCG/ACT inhaler Inhale 2 puffs into the lungs every 4 (four) hours as needed for wheezing. 15 g 1  . lisinopril (PRINIVIL,ZESTRIL) 20 MG tablet Take 20 mg by mouth daily.     . methylphenidate (RITALIN) 20 MG tablet Take 1 tablet (20 mg total) by mouth 2 (two) times daily with breakfast and lunch. 60 tablet 0  . methylphenidate (RITALIN) 20 MG tablet Take 1 tablet (20 mg total) by mouth 2 (two) times daily with breakfast and lunch. 60 tablet 0  . methylphenidate (RITALIN) 20 MG tablet Take 1 tablet (20 mg total) by mouth 2 (two) times daily with breakfast and lunch. 60 tablet 0  . Multiple Vitamin (MULITIVITAMIN WITH MINERALS) TABS Take 1 tablet by mouth daily.    . nabumetone (RELAFEN) 500 MG tablet Take 500 mg by mouth every 8 (eight) hours as needed.    Marland Kitchen omeprazole (PRILOSEC) 40 MG capsule Take 40 mg by mouth daily.    Marland Kitchen oxybutynin (DITROPAN-XL) 10 MG 24 hr tablet Take 10 mg by mouth at bedtime.    . Potassium 99 MG TABS Take 99 mg by mouth daily.     Marland Kitchen sulfamethoxazole-trimethoprim (BACTRIM DS) 800-160 MG tablet SMARTSIG:1 Tablet(s) By Mouth Every 12 Hours    . traZODone (DESYREL) 150 MG tablet Take 1 tablet (150 mg total) by mouth at bedtime. 90 tablet 2  . venlafaxine XR (EFFEXOR XR) 150 MG 24 hr capsule Take 1 capsule (150 mg total) by mouth daily with breakfast. 90 capsule 2   No current facility-administered medications for this visit.      Musculoskeletal: Strength & Muscle Tone: within normal limits Gait & Station: normal Patient leans: N/A  Psychiatric Specialty Exam: Review of Systems  Musculoskeletal: Positive for arthralgias and myalgias.  All other systems reviewed and are negative.   There were no vitals taken for this visit.There is no height or weight on file to calculate BMI.  General Appearance: NA  Eye Contact:  NA  Speech:  Clear and Coherent  Volume:  Normal  Mood:  Euthymic  Affect:  NA  Thought Process:  Goal Directed  Orientation:  Full (Time, Place, and Person)  Thought Content: Rumination   Suicidal Thoughts:  No  Homicidal Thoughts:  No  Memory:  Immediate;   Good Recent;   Good Remote;   Good  Judgement:  Good  Insight:  Fair  Psychomotor Activity:  Normal  Concentration:  Concentration: Good and Attention Span: Good  Recall:  Good  Fund of Knowledge: Good  Language: Good  Akathisia:  No  Handed:  Right  AIMS (if indicated): not done  Assets:  Communication Skills Desire for Improvement Resilience Social Support Talents/Skills  ADL's:  Intact  Cognition: WNL  Sleep:  Good   Screenings: MDI     Office Visit from 01/22/2016 in [facility]  Total Score (max 50) 34    Mini-Mental     Office Visit from 02/13/2015 in [facility]  Total Score (max 30 points ) 26    PHQ2-9     Office Visit from 05/09/2017 in [facility] Office Visit from 09/16/2015 in [facility]  PHQ-2 Total Score 0 4  PHQ-9 Total Score -- 10    SBQ-R     Office Visit from 01/22/2016 in [facility]  SBQ-R Total Score 14.1       Assessment and Plan: This patient is a 59 year old female with a history of depression and anxiety and difficulties with focus.  Her methylphenidate is working better at the higher dose-20 mg twice daily.  She will also continue Effexor XR 150 mg  daily for depression Xanax 1 mg 3 times daily for anxiety and trazodone 150 mg at bedtime for sleep.  She will return to see me in 3 months   Diamond Spiller, MD 04/08/2020, 1:44 PM

## 2020-05-06 ENCOUNTER — Encounter: Payer: Self-pay | Admitting: Allergy & Immunology

## 2020-05-06 ENCOUNTER — Ambulatory Visit (INDEPENDENT_AMBULATORY_CARE_PROVIDER_SITE_OTHER): Payer: Medicare Other | Admitting: Allergy & Immunology

## 2020-05-06 ENCOUNTER — Other Ambulatory Visit: Payer: Self-pay

## 2020-05-06 DIAGNOSIS — J3089 Other allergic rhinitis: Secondary | ICD-10-CM | POA: Diagnosis not present

## 2020-05-06 DIAGNOSIS — J302 Other seasonal allergic rhinitis: Secondary | ICD-10-CM

## 2020-05-06 DIAGNOSIS — R059 Cough, unspecified: Secondary | ICD-10-CM | POA: Diagnosis not present

## 2020-05-06 NOTE — Progress Notes (Signed)
RE: [name] MRN: [id number] DOB: 10-12-1960 Date of Telemedicine Visit: 05/06/2020  Referring provider: Celene Squibb, MD Primary care provider: Celene Squibb, MD  Chief Complaint: Cough (having issues at night with coughing spells. says that they last for 4 hours or so at night. she is using a humidifier, using flonase prior to bed, nasal rinses too. She has not used the Xopenex unless she felt like she was not able to breath well. )   Telemedicine Follow Up Visit via Telephone: I connected with [name] for a follow up on 05/06/20 by telephone and verified that I am speaking with the correct person using two identifiers.   I discussed the limitations, risks, security and privacy concerns of performing an evaluation and management service by telephone and the availability of in person appointments. I also discussed with the patient that there may be a patient responsible charge related to this service. The patient expressed understanding and agreed to proceed.  Patient is at home.  Provider is at the office.  Visit start time: 11:22 AM Visit end time: 11:48 AM Insurance consent/check in by: [facility] consent and medical assistant/nurse: [name]  History of Present Illness:  She is a 59 y.o. female, who is being followed for shortness of breath as well as perennial seasonal allergic rhinitis.. Her previous allergy office visit was in September 2021 with myself.  At that time, she presented for a follow-up telephone visit.  She was continued to have the cough was not interested in trying the free inhaler that we gave her to try.  Therefore, she brought it back.  She was not using her allergy medications on a routine basis.  She apparently thought that the medications were related to acute pharyngitis, so she stopped them all.  Frankly, she is not a lot of the time for the last visit complaining about her bill.  Regardless, she presents today for a sick televisit. She tells me  that this is what she does every day. She knew that she had ragweed allergies. In the morning, she uses her Allegra every morning. Then she typically uses her Flonase before she goes outside. At night, she uses her nasal saline before going to bed in conjunction with Flonase. Then she goes to sleep and she develops these "bouts of coughing". There is nothing that she has been able to do to control her coughing. It is a dry cough and not productive. She has the Xopenex which she has not tried using at night. She typically she uses that when she is short of breath only. She is still using her "snot rocket" Neilmed rinse bottle.   She does takes a Tagement at night, but this is not every night. Typically this is only when she needs it. She does take omeprazole every night. She has been on that for one year. Prior to that, she was on Protonix. She does cough a little during the daytime, but it is mostly at night.   She does NOT have a spacer and seems confused when I talk to her about this.    Otherwise, there have been no changes to her past medical history, surgical history, family history, or social history.  Assessment and Plan:  [name] is a 59 y.o. female with:  Cough -? asthma  Seasonal and perennial allergic rhinitis (ragweed, indoor molds, outdoor molds, cat and dog)  Complicated past medical history, including a stroke as well as anxiety and depression   It is  unclear where her cough is coming from, although I still think there is a component of asthma.  She never seems to be on board with thinking of asthma when she is coughing, but I did convince her today to try the Xopenex in the middle the night when she has these coughing fits to see if it helps at all.  She is going to report back in 1 to 2 weeks.  She is going to come by the office so that we can give her a spacer to more adequately deliver the Xopenex into her lungs.  We did show her how to use a spacer when she came later today.   She is already on reflux medications, so I do not think we need to worry about thinking of this cough is secondary to reflux.  She is going to continue on her omeprazole.   Diagnostics: None.  Medication List:  Current Outpatient Medications  Medication Sig Dispense Refill  . acetaminophen (TYLENOL) 500 MG tablet Take 1,000 mg by mouth every 6 (six) hours as needed for moderate pain.     Marland Kitchen ALPRAZolam (XANAX) 1 MG tablet Take 1 tablet (1 mg total) by mouth 3 (three) times daily. 90 tablet 2  . amLODipine (NORVASC) 5 MG tablet Take 5 mg by mouth daily.    Marland Kitchen atorvastatin (LIPITOR) 20 MG tablet Take 20 mg by mouth daily.    Marland Kitchen azelastine (ASTELIN) 0.1 % nasal spray 2 sprays each nostril 1-2 times daily as needed 30 mL 5  . B Complex-C (SUPER B COMPLEX PO) Take 1 tablet by mouth daily.     . diclofenac sodium (VOLTAREN) 1 % GEL Apply 2 g topically 4 (four) times daily. 100 g 0  . fluconazole (DIFLUCAN) 150 MG tablet Take 1 now and repeat 1 in 3 days 2 tablet 1  . levalbuterol (XOPENEX HFA) 45 MCG/ACT inhaler Inhale 2 puffs into the lungs every 4 (four) hours as needed for wheezing. 15 g 1  . lisinopril (PRINIVIL,ZESTRIL) 20 MG tablet Take 20 mg by mouth daily.     . methylphenidate (RITALIN) 20 MG tablet Take 1 tablet (20 mg total) by mouth 2 (two) times daily with breakfast and lunch. 60 tablet 0  . methylphenidate (RITALIN) 20 MG tablet Take 1 tablet (20 mg total) by mouth 2 (two) times daily with breakfast and lunch. 60 tablet 0  . methylphenidate (RITALIN) 20 MG tablet Take 1 tablet (20 mg total) by mouth 2 (two) times daily with breakfast and lunch. 60 tablet 0  . Multiple Vitamin (MULITIVITAMIN WITH MINERALS) TABS Take 1 tablet by mouth daily.    . nabumetone (RELAFEN) 500 MG tablet Take 500 mg by mouth every 8 (eight) hours as needed.    Marland Kitchen omeprazole (PRILOSEC) 40 MG capsule Take 40 mg by mouth daily.    Marland Kitchen oxybutynin (DITROPAN-XL) 10 MG 24 hr tablet Take 10 mg by mouth at bedtime.    .  Potassium 99 MG TABS Take 99 mg by mouth daily.     . traZODone (DESYREL) 150 MG tablet Take 1 tablet (150 mg total) by mouth at bedtime. 90 tablet 2  . venlafaxine XR (EFFEXOR XR) 150 MG 24 hr capsule Take 1 capsule (150 mg total) by mouth daily with breakfast. 90 capsule 2   No current facility-administered medications for this visit.   Allergies: Allergies  Allergen Reactions  . Morphine And Related Hives  . Promethazine Hcl Other (See Comments)    Causes patient to  become Hyper   I reviewed her past medical history, social history, family history, and environmental history and no significant changes have been reported from previous visits.  Review of Systems  Constitutional: Negative for activity change and appetite change.  HENT: Negative for congestion, postnasal drip, rhinorrhea, sinus pressure and sore throat.   Eyes: Negative for pain, discharge, redness and itching.  Respiratory: Positive for cough. Negative for shortness of breath, wheezing and stridor.   Gastrointestinal: Negative for diarrhea, nausea and vomiting.  Musculoskeletal: Negative for arthralgias, joint swelling and myalgias.  Skin: Negative for rash.  Allergic/Immunologic: Negative for environmental allergies and food allergies.    Objective:  Physical exam not obtained as encounter was done via telephone.   Previous notes and tests were reviewed.  I discussed the assessment and treatment plan with the patient. The patient was provided an opportunity to ask questions and all were answered. The patient agreed with the plan and demonstrated an understanding of the instructions.   The patient was advised to call back or seek an in-person evaluation if the symptoms worsen or if the condition fails to improve as anticipated.  I provided 26 minutes of non-face-to-face time during this encounter.  It was my pleasure to participate in [place] care today. Please feel free to contact me with any questions  or concerns.   Sincerely,  Valentina Shaggy, MD

## 2020-06-08 ENCOUNTER — Other Ambulatory Visit: Payer: Self-pay | Admitting: Nurse Practitioner

## 2020-06-11 ENCOUNTER — Ambulatory Visit: Payer: No Typology Code available for payment source | Admitting: Internal Medicine

## 2020-06-15 ENCOUNTER — Ambulatory Visit (INDEPENDENT_AMBULATORY_CARE_PROVIDER_SITE_OTHER): Payer: Medicare Other | Admitting: Internal Medicine

## 2020-06-15 ENCOUNTER — Other Ambulatory Visit: Payer: Self-pay

## 2020-06-15 ENCOUNTER — Encounter: Payer: Self-pay | Admitting: Internal Medicine

## 2020-06-15 VITALS — BP 150/85 | HR 87 | Temp 98.3°F | Resp 18 | Ht 63.0 in | Wt 148.1 lb

## 2020-06-15 DIAGNOSIS — E785 Hyperlipidemia, unspecified: Secondary | ICD-10-CM

## 2020-06-15 DIAGNOSIS — Z7689 Persons encountering health services in other specified circumstances: Secondary | ICD-10-CM | POA: Diagnosis not present

## 2020-06-15 DIAGNOSIS — I69359 Hemiplegia and hemiparesis following cerebral infarction affecting unspecified side: Secondary | ICD-10-CM

## 2020-06-15 DIAGNOSIS — J069 Acute upper respiratory infection, unspecified: Secondary | ICD-10-CM | POA: Diagnosis not present

## 2020-06-15 DIAGNOSIS — R32 Unspecified urinary incontinence: Secondary | ICD-10-CM

## 2020-06-15 DIAGNOSIS — J45909 Unspecified asthma, uncomplicated: Secondary | ICD-10-CM

## 2020-06-15 DIAGNOSIS — F419 Anxiety disorder, unspecified: Secondary | ICD-10-CM

## 2020-06-15 DIAGNOSIS — I10 Essential (primary) hypertension: Secondary | ICD-10-CM

## 2020-06-15 DIAGNOSIS — F32A Depression, unspecified: Secondary | ICD-10-CM

## 2020-06-15 DIAGNOSIS — K219 Gastro-esophageal reflux disease without esophagitis: Secondary | ICD-10-CM

## 2020-06-15 HISTORY — DX: Unspecified asthma, uncomplicated: J45.909

## 2020-06-15 MED ORDER — AZITHROMYCIN 250 MG PO TABS: ORAL_TABLET | ORAL | 0 refills | Status: DC

## 2020-06-15 NOTE — Assessment & Plan Note (Signed)
On Levalbuterol inhaler Azelastine nasal spray Follows up with Allergy specialist

## 2020-06-15 NOTE — Assessment & Plan Note (Signed)
Well-controlled with Trazodone and Effexor Follows up with Psychiatrist

## 2020-06-15 NOTE — Assessment & Plan Note (Signed)
In 1999, had residual left sided hemiplegia, no neurologic symptoms related to stroke currently

## 2020-06-15 NOTE — Assessment & Plan Note (Signed)
On Oxybutynin

## 2020-06-15 NOTE — Patient Instructions (Signed)
Please start taking Azithromycin as prescribed.  Okay to use Mucinex or Robitussin for cough and congestion. Please continue to use inhalers as prescribed.  Please continue to check BP at home. Please contact us if BP is persistently more than 150/90.  Please follow DASH diet and perform moderate exercise/walking at least 150 mins/week.  DASH stands for Dietary Approaches to Stop Hypertension. The DASH diet is a healthy-eating plan designed to help treat or prevent high blood pressure (hypertension).  The DASH diet includes foods that are rich in potassium, calcium and magnesium. These nutrients help control blood pressure. The diet limits foods that are high in sodium, saturated fat and added sugars.  Studies have shown that the DASH diet can lower blood pressure in as little as two weeks. The diet can also lower low-density lipoprotein (LDL or "bad") cholesterol levels in the blood. High blood pressure and high LDL cholesterol levels are two major risk factors for heart disease and stroke.    DASH diet: Recommended servings The DASH diet provides daily and weekly nutritional goals. The number of servings you should have depends on your daily calorie needs.  Here's a look at the recommended servings from each food group for a 2,000-calorie-a-day DASH diet:  Grains: 6 to 8 servings a day. One serving is one slice bread, 1 ounce dry cereal, or 1/2 cup cooked cereal, rice or pasta. Vegetables: 4 to 5 servings a day. One serving is 1 cup raw leafy green vegetable, 1/2 cup cut-up raw or cooked vegetables, or 1/2 cup vegetable juice. Fruits: 4 to 5 servings a day. One serving is one medium fruit, 1/2 cup fresh, frozen or canned fruit, or 1/2 cup fruit juice. Fat-free or low-fat dairy products: 2 to 3 servings a day. One serving is 1 cup milk or yogurt, or 1 1/2 ounces cheese. Lean meats, poultry and fish: six 1-ounce servings or fewer a day. One serving is 1 ounce cooked meat, poultry or fish, or  1 egg. Nuts, seeds and legumes: 4 to 5 servings a week. One serving is 1/3 cup nuts, 2 tablespoons peanut butter, 2 tablespoons seeds, or 1/2 cup cooked legumes (dried beans or peas). Fats and oils: 2 to 3 servings a day. One serving is 1 teaspoon soft margarine, 1 teaspoon vegetable oil, 1 tablespoon mayonnaise or 2 tablespoons salad dressing. Sweets and added sugars: 5 servings or fewer a week. One serving is 1 tablespoon sugar, jelly or jam, 1/2 cup sorbet, or 1 cup lemonade.

## 2020-06-15 NOTE — Assessment & Plan Note (Signed)
Prescribed Azithromycin as she has persistent symptoms for 2 weeks Mucinex or Robitussin PRN for cough and congestion Tylenol PRN for fever

## 2020-06-15 NOTE — Assessment & Plan Note (Signed)
Care established Previous chart reviewed History and medications reviewed with the patient 

## 2020-06-15 NOTE — Assessment & Plan Note (Signed)
On Omeprazole

## 2020-06-15 NOTE — Assessment & Plan Note (Signed)
On Atorvastatin 20 mg QD

## 2020-06-15 NOTE — Assessment & Plan Note (Signed)
BP Readings from Last 1 Encounters:  06/15/20 (!) 150/85   Elevated currently, could be due to decongestants Continue Amlodipine 5 mg QD and Lisinopril 20 mg QD Counseled for compliance with the medications Advised DASH diet and moderate exercise/walking, at least 150 mins/week

## 2020-06-15 NOTE — Progress Notes (Signed)
New Patient Office Visit  Subjective:  Patient ID: [name], female    DOB: [birth year]  Age: 59 y.o. MRN: [id number]  CC:  Chief Complaint  Patient presents with  . New Patient (Initial Visit)    New pt has some chest congestion has been going on for a couple of weeks started with a low grade fever cannnot breathe when it gets hot     HPI [name] is a 59 year old female with PMH of HTN, asthma due to seasonal allergies, h/o hemorrhagic stroke in 1999 (unclear etiology), HLD, anxiety with depression, urinary incontinence and GERD who presents for establishing care.  Patient has been having nasal congestion with low grade fever for last 2 weeks. She also c/o cough, for which she has used Mucinex. She states that her BP has been running high around 140/90 due to cough and congestion medications. She denies any change in taste sensation or sick contacts. She has been using Azelastine spray and Levalbuterol inhaler as needed for dyspnea and wheezing.  She takes Amlodipine and Lisinopril regularly. Her home BP readings were reviewed, most of them are below 140/90 with occasional higher readings. She has intermittent, dull headache related to high BP> She denies chest pain, dyspnea or palpitations.  She takes Alprazolam, Trazodone, Effexor and Ritalin for her anxiety and depression and she follows up with [name].  Patient had last colonoscopy last year.  Patient had last Mammography in 02/2020.  Patient has had 2 doses of COVID vaccine. She has had flu vaccine.  Past Medical History:  Diagnosis Date  . Allergy    grass, dust , mold  . Anxiety   . Arthritis   . Asthma due to seasonal allergies 06/15/2020  . Bipolar disorder ([place])   . Cancer (HCC)    cervical cancer  . Carpal tunnel syndrome    Bilateral  . Chest pain 09/2011   Cardiac cath-normal coronaries  . Constipation   . Depression   . Difficulty urinating 05/31/2013  . Elevated LFTs 12/16/2013  . GERD  (gastroesophageal reflux disease)   . History of kidney stones   . Hyperlipemia   . Hyperlipidemia   . Hypertension    Mild; provoked by stress and anxiety  . IBS (irritable bowel syndrome)   . Intracerebral bleed ([place])    No aneurysm; followed by Dr. Sherwood Gambler  . Loss of weight 01/06/2015  . Osteoporosis   . Stroke [facility]) 1999   hemorrhagic stroke; weakness of left side    Past Surgical History:  Procedure Laterality Date  . ABDOMINAL HYSTERECTOMY     "cancer cells"  . [place]   to remove blood clot after stroke   . CARDIAC CATHETERIZATION  2016  . CERVICAL FUSION    . CHOLECYSTECTOMY N/A 10/14/2014   Procedure: LAPAROSCOPIC CHOLECYSTECTOMY WITH INTRAOPERATIVE CHOLANGIOGRAM;  Surgeon: Jackolyn Confer, MD;  Location: [facility];  Service: General;  Laterality: N/A;  . CHONDROPLASTY Right 07/13/2017   Procedure: CHONDROPLASTY of patella;  Surgeon: Carole Civil, MD;  Location: [facility];  Service: Orthopedics;  Laterality: Right;  . EUS N/A 08/21/2015   Procedure: ESOPHAGEAL ENDOSCOPIC ULTRASOUND (EUS) RADIAL;  Surgeon: Carol Ada, MD;  Location: [facility];  Service: Endoscopy;  Laterality: N/A;  . KNEE ARTHROSCOPY WITH MEDIAL MENISECTOMY Right 07/13/2017   Procedure: KNEE ARTHROSCOPY WITH PARTIAL MEDIAL MENISECTOMY;  Surgeon: Carole Civil, MD;  Location: [facility];  Service: Orthopedics;  Laterality: Right;  . LEFT HEART CATHETERIZATION WITH CORONARY  ANGIOGRAM N/A 09/23/2011   Procedure: LEFT HEART CATHETERIZATION WITH CORONARY ANGIOGRAM;  Surgeon: Thayer Headings, MD;  Location: [facility] CATH LAB;  Service: Cardiovascular;  Laterality: N/A;  . LIPOMA EXCISION Left 11/18/2013   Procedure: EXCISION OF SOFT TISSUE MASS-LEFT THIGH;  Surgeon: Jamesetta So, MD;  Location: [facility];  Service: General;  Laterality: Left;  . RECTOCELE REPAIR     x2  . RECTOCELE REPAIR N/A 04/04/2017   Procedure: POSTERIOR REPAIR (RECTOCELE);  Surgeon: Jonnie Kind, MD;  Location: [facility];  Service:  Gynecology;  Laterality: N/A;    Family History  Problem Relation Age of Onset  . Cancer Mother        breast   . Hypertension Mother   . Hyperlipidemia Mother   . Depression Mother   . Anxiety disorder Mother   . COPD Mother   . Arthritis Mother        rheumatoid  . Drug abuse Sister   . Coronary artery disease Paternal Grandfather   . Coronary artery disease Paternal Uncle   . Depression Cousin   . Drug abuse Cousin     Social History   Socioeconomic History  . Marital status: Married    Spouse name: Sonia Side  . Number of children: 0  . Years of education: HS  . Highest education level: Not on file  Occupational History  . Occupation: unemployed    Comment: pending disability  Tobacco Use  . Smoking status: Former Smoker    Packs/day: 1.00    Years: 19.00    Pack years: 19.00    Types: Cigarettes    Quit date: 09/01/1997    Years since quitting: 22.8  . Smokeless tobacco: Never Used  . Tobacco comment: Quit smoking 1999 , previous 20 pack years  Vaping Use  . Vaping Use: Never used  Substance and Sexual Activity  . Alcohol use: Yes    Comment: 1 drink every other week  . Drug use: No  . Sexual activity: Not Currently    Partners: Male    Birth control/protection: Surgical    Comment: hyst   Other Topics Concern  . Not on file  Social History Narrative   Currently unable to work   Lives in [place]   Married   Patient drinks 1 cup of caffeine daily.   Patient is right handed.    [employer]ined the [employer] to get more exercise   Social Determinants of Health   Financial Resource Strain: Not on file  Food Insecurity: Not on file  Transportation Needs: Not on file  Physical Activity: Not on file  Stress: Not on file  Social Connections: Not on file  Intimate Partner Violence: Not on file    ROS Review of Systems  Constitutional: Negative for chills and fever.  HENT: Positive for congestion, postnasal drip, sinus pressure and sinus pain. Negative for ear  discharge, ear pain, sore throat and voice change.   Eyes: Negative for pain and discharge.  Respiratory: Positive for cough and wheezing. Negative for shortness of breath.   Cardiovascular: Negative for chest pain and palpitations.  Gastrointestinal: Negative for abdominal pain, constipation, diarrhea, nausea and vomiting.  Endocrine: Negative for polydipsia and polyuria.  Genitourinary: Negative for dysuria and hematuria.  Musculoskeletal: Negative for neck pain and neck stiffness.  Skin: Negative for rash.  Neurological: Negative for dizziness and weakness.  Psychiatric/Behavioral: Negative for agitation and behavioral problems.    Objective:   Today's Vitals: BP (!) 150/85 (BP Location: Right  Arm, Patient Position: Sitting, Cuff Size: Normal)   Pulse 87   Temp 98.3 F (36.8 C) (Oral)   Resp 18   Ht 5' 3"  (1.6 m)   Wt 148 lb 1.9 oz (67.2 kg)   SpO2 95%   BMI 26.24 kg/m   Physical Exam Vitals reviewed.  Constitutional:      General: She is not in acute distress.    Appearance: She is not diaphoretic.  HENT:     Head: Normocephalic and atraumatic.     Nose: Congestion present.     Mouth/Throat:     Mouth: Mucous membranes are dry.     Pharynx: No oropharyngeal exudate.  Eyes:     General: No scleral icterus.    Extraocular Movements: Extraocular movements intact.     Pupils: Pupils are equal, round, and reactive to light.  Cardiovascular:     Rate and Rhythm: Normal rate and regular rhythm.     Pulses: Normal pulses.     Heart sounds: Normal heart sounds. No murmur heard.   Pulmonary:     Breath sounds: Normal breath sounds. No wheezing or rales.  Musculoskeletal:     Cervical back: Neck supple. No tenderness.     Right lower leg: No edema.     Left lower leg: No edema.  Skin:    General: Skin is warm.     Findings: No rash.  Neurological:     General: No focal deficit present.     Mental Status: She is alert and oriented to person, place, and time.      Sensory: No sensory deficit.     Motor: No weakness.  Psychiatric:        Mood and Affect: Mood normal.        Behavior: Behavior normal.      Assessment & Plan:   Problem List Items Addressed This Visit      Cardiovascular and Mediastinum   Hypertension    BP Readings from Last 1 Encounters:  06/15/20 (!) 150/85   Elevated currently, could be due to decongestants Continue Amlodipine 5 mg QD and Lisinopril 20 mg QD Counseled for compliance with the medications Advised DASH diet and moderate exercise/walking, at least 150 mins/week       Relevant Medications   cloNIDine (CATAPRES) 0.2 MG tablet     Respiratory   URTI (acute upper respiratory infection)    Prescribed Azithromycin as she has persistent symptoms for 2 weeks Mucinex or Robitussin PRN for cough and congestion Tylenol PRN for fever      Relevant Medications   azithromycin (ZITHROMAX) 250 MG tablet   Asthma due to seasonal allergies    On Levalbuterol inhaler Azelastine nasal spray Follows up with Allergy specialist        Other   Hyperlipidemia (Chronic)    On Atorvastatin 20 mg QD      Relevant Medications   cloNIDine (CATAPRES) 0.2 MG tablet   Anxiety (Chronic)    Follows up with Psychiatrist On Alprazolam 1 mg TID, Effexor 150 mg QD and Trazodone 150 mg qHS      Depression    Well-controlled with Trazodone and Effexor Follows up with Psychiatrist      History of hemorrhagic stroke with residual hemiplegia ([place])    In 1999, had residual left sided hemiplegia, no neurologic symptoms related to stroke currently      Encounter to establish care - Primary    Care established Previous chart reviewed History and medications reviewed  with the patient      Relevant Orders   CBC with Differential   CMP14+EGFR   Lipid Profile   TSH + free T4   Vitamin D (25 hydroxy)   Urinary incontinence    On Oxybutynin         Outpatient Encounter Medications as of 06/15/2020  Medication Sig  .  acetaminophen (TYLENOL) 500 MG tablet Take 1,000 mg by mouth every 6 (six) hours as needed for moderate pain.   Marland Kitchen ALPRAZolam (XANAX) 1 MG tablet Take 1 tablet (1 mg total) by mouth 3 (three) times daily.  Marland Kitchen amLODipine (NORVASC) 5 MG tablet Take 5 mg by mouth daily.  Marland Kitchen atorvastatin (LIPITOR) 20 MG tablet Take 20 mg by mouth daily.  Marland Kitchen azelastine (ASTELIN) 0.1 % nasal spray 2 sprays each nostril 1-2 times daily as needed  . B Complex-C (SUPER B COMPLEX PO) Take 1 tablet by mouth daily.   . cloNIDine (CATAPRES) 0.2 MG tablet Take 0.2 mg by mouth 2 (two) times daily. As needed  . diclofenac sodium (VOLTAREN) 1 % GEL Apply 2 g topically 4 (four) times daily.  Marland Kitchen docusate sodium (COLACE) 100 MG capsule Take 100 mg by mouth daily.  . fluconazole (DIFLUCAN) 150 MG tablet Take 1 now and repeat 1 in 3 days  . levalbuterol (XOPENEX HFA) 45 MCG/ACT inhaler Inhale 2 puffs into the lungs every 4 (four) hours as needed for wheezing.  Marland Kitchen lisinopril (PRINIVIL,ZESTRIL) 20 MG tablet Take 20 mg by mouth daily.   . methylphenidate (RITALIN) 20 MG tablet Take 1 tablet (20 mg total) by mouth 2 (two) times daily with breakfast and lunch.  . methylphenidate (RITALIN) 20 MG tablet Take 1 tablet (20 mg total) by mouth 2 (two) times daily with breakfast and lunch.  . methylphenidate (RITALIN) 20 MG tablet Take 1 tablet (20 mg total) by mouth 2 (two) times daily with breakfast and lunch.  . Multiple Vitamin (MULITIVITAMIN WITH MINERALS) TABS Take 1 tablet by mouth daily.  . nabumetone (RELAFEN) 500 MG tablet Take 500 mg by mouth every 8 (eight) hours as needed.  Marland Kitchen omeprazole (PRILOSEC) 40 MG capsule Take 40 mg by mouth daily.  Marland Kitchen oxybutynin (DITROPAN-XL) 10 MG 24 hr tablet Take 10 mg by mouth at bedtime.  . Potassium 99 MG TABS Take 99 mg by mouth daily.   . traZODone (DESYREL) 150 MG tablet Take 1 tablet (150 mg total) by mouth at bedtime.  Marland Kitchen venlafaxine XR (EFFEXOR XR) 150 MG 24 hr capsule Take 1 capsule (150 mg total) by  mouth daily with breakfast.  . azithromycin (ZITHROMAX) 250 MG tablet Take as package instructions   No facility-administered encounter medications on file as of 06/15/2020.    Follow-up: Return in about 3 months (around 09/13/2020).   Lindell Spar, MD

## 2020-06-15 NOTE — Assessment & Plan Note (Signed)
Follows up with Psychiatrist On Alprazolam 1 mg TID, Effexor 150 mg QD and Trazodone 150 mg qHS

## 2020-06-18 ENCOUNTER — Telehealth: Payer: Self-pay | Admitting: *Deleted

## 2020-06-18 NOTE — Telephone Encounter (Signed)
Pt called said the antibiotic that was given to her is not working and not breaking anything up please advise

## 2020-06-18 NOTE — Telephone Encounter (Signed)
Some of the symptoms of upper respiratory tract infection persist for up to 2 weeks despite the appropriate antibiotics. Her sinus symptoms might be related to allergy as well. Please advise her to continue taking allergy medication, like Zyrtec/Claritin. Please advise her to reach out to her Allergy specialist.

## 2020-06-19 ENCOUNTER — Encounter: Payer: Self-pay | Admitting: Internal Medicine

## 2020-06-19 ENCOUNTER — Other Ambulatory Visit: Payer: Self-pay

## 2020-06-19 ENCOUNTER — Ambulatory Visit (HOSPITAL_COMMUNITY)
Admission: RE | Admit: 2020-06-19 | Discharge: 2020-06-19 | Disposition: A | Discharge status: Home / Self Care | Payer: Medicare Other | Source: Ambulatory Visit | Attending: Internal Medicine | Admitting: Internal Medicine

## 2020-06-19 ENCOUNTER — Telehealth (INDEPENDENT_AMBULATORY_CARE_PROVIDER_SITE_OTHER): Payer: Medicare Other | Admitting: Internal Medicine

## 2020-06-19 DIAGNOSIS — F419 Anxiety disorder, unspecified: Secondary | ICD-10-CM

## 2020-06-19 DIAGNOSIS — R197 Diarrhea, unspecified: Secondary | ICD-10-CM

## 2020-06-19 DIAGNOSIS — J45909 Unspecified asthma, uncomplicated: Secondary | ICD-10-CM | POA: Diagnosis present

## 2020-06-19 DIAGNOSIS — M940 Chondrocostal junction syndrome [Tietze]: Secondary | ICD-10-CM | POA: Insufficient documentation

## 2020-06-19 MED ORDER — FLOVENT HFA 44 MCG/ACT IN AERO: 2.0000 | INHALATION_SPRAY | Freq: Two times a day (BID) | RESPIRATORY_TRACT | 12 refills | Status: DC

## 2020-06-19 MED ORDER — MONTELUKAST SODIUM 10 MG PO TABS: 10.0000 mg | ORAL_TABLET | Freq: Every day | ORAL | 3 refills | Status: DC

## 2020-06-19 NOTE — Patient Instructions (Signed)
Please get chest X-ray done soon.  Please start using Flovent inhaler as prescribed. Please start taking Singulair as prescribed.  Please increase water intake to at least 2 liters/64 oz in a day. If you have persistent diarrhea, contact us. If you have blood in stool, persistent shortness of breath even after using Levalbuterol inhaler or chest pain with palpitations, please go to ER immediately.

## 2020-06-19 NOTE — Progress Notes (Signed)
Virtual Visit via Telephone Note   This visit type was conducted due to national recommendations for restrictions regarding the COVID-19 Pandemic (e.g. social distancing) in an effort to limit this patient's exposure and mitigate transmission in our community.  Due to her co-morbid illnesses, this patient is at least at moderate risk for complications without adequate follow up.  This format is felt to be most appropriate for this patient at this time.  The patient did not have access to video technology/had technical difficulties with video requiring transitioning to audio format only (telephone).  All issues noted in this document were discussed and addressed.  No physical exam could be performed with this format.  Evaluation Performed:  Follow-up visit  Date:  06/19/2020   ID:  Diamond, Santiago 12-06-1960, MRN [id number]  Patient Location: Home Provider Location: Office/Clinic  Participants: Patient Location of Patient: Home Location of Provider: Telehealth Consent was obtain for visit to be over via telehealth. I verified that I am speaking with the correct person using two identifiers.  PCP:  Lindell Spar, MD   Chief Complaint:  Chest tightness  History of Present Illness:    [name] is a 59 y.o. female with PMH of HTN, asthma due to seasonal allergies, h/o hemorrhagic stroke in 1999 (unclear etiology), HLD, anxiety with depression, urinary incontinence and GERD who has a televisit for c/o chest tightness and dyspnea.  Patient c/o dry cough, nasal congestion, low grade fever in the previous visit, for which she was prescribed Azithromycin. She states that she does not have nasal congestion, but has dyspnea and chest tightness, especially while walking outdoors. She has a h/o asthma related to seasonal allergies, for which she takes Allegra and uses Levalbuterol inhaler as PRN. She had episodes of dyspnea yesterday while walking outdoors and had to come back to  home due to that. She denies any chest pain or palpitations. She denies fever, chills, change in taste or smell sensation or sore throat.  She also c/o loose BM since yesterday, which is likely due to Azithromycin. Patient is advised to increase fluid intake.  She is also concerned about her BP being hight at times, which could be related to nasal decongestants. She denies headache, dizziness, chest pain or palpitations.  The patient does not have symptoms concerning for COVID-19 infection (fever, chills, cough, or new shortness of breath).   Past Medical, Surgical, Social History, Allergies, and Medications have been Reviewed.  Past Medical History:  Diagnosis Date  . Allergy    grass, dust , mold  . Anxiety   . Arthritis   . Asthma due to seasonal allergies 06/15/2020  . Bipolar disorder ([place])   . Cancer (HCC)    cervical cancer  . Carpal tunnel syndrome    Bilateral  . Chest pain 09/2011   Cardiac cath-normal coronaries  . Constipation   . Depression   . Difficulty urinating 05/31/2013  . Elevated LFTs 12/16/2013  . GERD (gastroesophageal reflux disease)   . History of kidney stones   . Hyperlipemia   . Hyperlipidemia   . Hypertension    Mild; provoked by stress and anxiety  . IBS (irritable bowel syndrome)   . Intracerebral bleed ([place])    No aneurysm; followed by Dr. Sherwood Gambler  . Loss of weight 01/06/2015  . Osteoporosis   . Stroke [facility]) 1999   hemorrhagic stroke; weakness of left side   Past Surgical History:  Procedure Laterality Date  . ABDOMINAL HYSTERECTOMY     "  cancer cells"  . [facility]   to remove blood clot after stroke   . CARDIAC CATHETERIZATION  2016  . CERVICAL FUSION    . CHOLECYSTECTOMY N/A 10/14/2014   Procedure: LAPAROSCOPIC CHOLECYSTECTOMY WITH INTRAOPERATIVE CHOLANGIOGRAM;  Surgeon: Jackolyn Confer, MD;  Location: [place];  Service: General;  Laterality: N/A;  . CHONDROPLASTY Right 07/13/2017   Procedure: CHONDROPLASTY of patella;  Surgeon:  Carole Civil, MD;  [facility];  Service: Orthopedics;  Laterality: Right;  . EUS N/A 08/21/2015   Procedure: ESOPHAGEAL ENDOSCOPIC ULTRASOUND (EUS) RADIAL;  Surgeon: Carol Ada, MD;  [facility]: [facility];  Service: Endoscopy;  Laterality: N/A;  . KNEE ARTHROSCOPY WITH MEDIAL MENISECTOMY Right 07/13/2017   Procedure: KNEE ARTHROSCOPY WITH PARTIAL MEDIAL MENISECTOMY;  Surgeon: Carole Civil, MD;  [facility]: [facility];  Service: Orthopedics;  Laterality: Right;  . LEFT HEART CATHETERIZATION WITH CORONARY ANGIOGRAM N/A 09/23/2011   Procedure: LEFT HEART CATHETERIZATION WITH CORONARY ANGIOGRAM;  Surgeon: Thayer Headings, MD;  Location: [facility] CATH LAB;  Service: Cardiovascular;  Laterality: N/A;  . LIPOMA EXCISION Left 11/18/2013   Procedure: EXCISION OF SOFT TISSUE MASS-LEFT THIGH;  Surgeon: Jamesetta So, MD;  Location: [facility];  Service: General;  Laterality: Left;  . RECTOCELE REPAIR     x2  . RECTOCELE REPAIR N/A 04/04/2017   Procedure: POSTERIOR REPAIR (RECTOCELE);  Surgeon: Jonnie Kind, MD;  Location: [facility];  Service: Gynecology;  Laterality: N/A;     Current Meds  Medication Sig  . acetaminophen (TYLENOL) 500 MG tablet Take 1,000 mg by mouth every 6 (six) hours as needed for moderate pain.   Marland Kitchen ALPRAZolam (XANAX) 1 MG tablet Take 1 tablet (1 mg total) by mouth 3 (three) times daily.  Marland Kitchen amLODipine (NORVASC) 5 MG tablet Take 5 mg by mouth daily.  Marland Kitchen atorvastatin (LIPITOR) 20 MG tablet Take 20 mg by mouth daily.  Marland Kitchen azelastine (ASTELIN) 0.1 % nasal spray 2 sprays each nostril 1-2 times daily as needed  . azithromycin (ZITHROMAX) 250 MG tablet Take as package instructions  . B Complex-C (SUPER B COMPLEX PO) Take 1 tablet by mouth daily.   . cloNIDine (CATAPRES) 0.2 MG tablet Take 0.2 mg by mouth 2 (two) times daily. As needed  . diclofenac sodium (VOLTAREN) 1 % GEL Apply 2 g topically 4 (four) times daily.  Marland Kitchen docusate sodium (COLACE) 100 MG capsule Take 100 mg by mouth daily.   . fluconazole (DIFLUCAN) 150 MG tablet Take 1 now and repeat 1 in 3 days  . levalbuterol (XOPENEX HFA) 45 MCG/ACT inhaler Inhale 2 puffs into the lungs every 4 (four) hours as needed for wheezing.  Marland Kitchen lisinopril (PRINIVIL,ZESTRIL) 20 MG tablet Take 20 mg by mouth daily.   . methylphenidate (RITALIN) 20 MG tablet Take 1 tablet (20 mg total) by mouth 2 (two) times daily with breakfast and lunch.  . methylphenidate (RITALIN) 20 MG tablet Take 1 tablet (20 mg total) by mouth 2 (two) times daily with breakfast and lunch.  . methylphenidate (RITALIN) 20 MG tablet Take 1 tablet (20 mg total) by mouth 2 (two) times daily with breakfast and lunch.  . Multiple Vitamin (MULITIVITAMIN WITH MINERALS) TABS Take 1 tablet by mouth daily.  . nabumetone (RELAFEN) 500 MG tablet Take 500 mg by mouth every 8 (eight) hours as needed.  Marland Kitchen omeprazole (PRILOSEC) 40 MG capsule Take 40 mg by mouth daily.  Marland Kitchen oxybutynin (DITROPAN-XL) 10 MG 24 hr tablet Take 10 mg by mouth at  bedtime.  . Potassium 99 MG TABS Take 99 mg by mouth daily.   . traZODone (DESYREL) 150 MG tablet Take 1 tablet (150 mg total) by mouth at bedtime.  Marland Kitchen venlafaxine XR (EFFEXOR XR) 150 MG 24 hr capsule Take 1 capsule (150 mg total) by mouth daily with breakfast.     Allergies:   Morphine and related and Promethazine hcl   ROS:   Please see the history of present illness.    Review of Systems  Constitutional: Negative for chills and fever.  HENT: Negative for congestion, sinus pain and sore throat.   Eyes: Negative for pain and redness.  Respiratory: Positive for cough, shortness of breath and wheezing. Negative for sputum production.   Cardiovascular: Negative for chest pain and palpitations.  Gastrointestinal: Positive for diarrhea. Negative for blood in stool, constipation, melena, nausea and vomiting.  Genitourinary: Negative for dysuria and hematuria.  Musculoskeletal: Negative for falls and neck pain.  Skin: Negative for rash.  Neurological:  Negative for dizziness, sensory change, speech change and focal weakness.  Psychiatric/Behavioral: Negative for depression and suicidal ideas.     Labs/Other Tests and Data Reviewed:    Recent Labs: No results found for requested labs within last 8760 hours.   Recent Lipid Panel Lab Results  Component Value Date/Time   CHOL 206 (H) 06/02/2017 09:22 AM   TRIG 141 06/02/2017 09:22 AM   HDL 76 06/02/2017 09:22 AM   CHOLHDL 2.3 09/16/2015 09:39 AM   LDLCALC 102 (H) 06/02/2017 09:22 AM    Wt Readings from Last 3 Encounters:  06/15/20 148 lb 1.9 oz (67.2 kg)  03/16/20 150 lb (68 kg)  02/05/20 148 lb 6.4 oz (67.3 kg)     ASSESSMENT & PLAN:    Diarrhea Likely due to antibiotic Advised to increase water intake to at least 2 liters per day Contact if she has watery diarrhea or melena or hematochezia  Anxiety Follows up with Psychiatrist On Alprazolam 1 mg TID, Effexor 150 mg QD and Trazodone 150 mg qHS Anxiety episode can also contribute to tachypnea and chest tightness.  Asthma due to seasonal allergies On Levalbuterol inhaler Takes Allegra Added Flovent inhaler Started Singulair Check CXR to r/o infiltrates or consolidation Will prescribed short-term steroids if persistent symptoms    Time:   Today, I have spent 22 minutes reviewing the chart, including problem list, medications, and with the patient with telehealth technology discussing the above problems.   Medication Adjustments/Labs and Tests Ordered: Current medicines are reviewed at length with the patient today.  Concerns regarding medicines are outlined above.   Tests Ordered: Orders Placed This Encounter  Procedures  . DG Chest 2 View    Medication Changes: Meds ordered this encounter  Medications  . montelukast (SINGULAIR) 10 MG tablet    Sig: Take 1 tablet (10 mg total) by mouth at bedtime.    Dispense:  30 tablet    Refill:  3  . fluticasone (FLOVENT HFA) 44 MCG/ACT inhaler    Sig: Inhale 2  puffs into the lungs in the morning and at bedtime.    Dispense:  1 each    Refill:  12     Note: This dictation was prepared with Dragon dictation along with smaller phrase technology. Similar sounding words can be transcribed inadequately or may not be corrected upon review. Any transcriptional errors that result from this process are unintentional.      Disposition:  Follow up  Signed, Lindell Spar, MD  06/19/2020 10:28  AM     [facility]

## 2020-06-19 NOTE — Assessment & Plan Note (Signed)
On Levalbuterol inhaler Takes Allegra Added Flovent inhaler Started Singulair Check CXR to r/o infiltrates or consolidation Will prescribed short-term steroids if persistent symptoms

## 2020-06-19 NOTE — Assessment & Plan Note (Addendum)
Follows up with Psychiatrist On Alprazolam 1 mg TID, Effexor 150 mg QD and Trazodone 150 mg qHS Anxiety episode can also contribute to tachypnea and chest tightness.

## 2020-06-19 NOTE — Telephone Encounter (Signed)
Called pt she would like to discuss this as the antibiotic is giving her diarrhea and it just isnt working made a phone visit with Dr Posey Pronto 06-19-20

## 2020-06-19 NOTE — Assessment & Plan Note (Signed)
Likely due to antibiotic Advised to increase water intake to at least 2 liters per day Contact if she has watery diarrhea or melena or hematochezia

## 2020-06-23 ENCOUNTER — Other Ambulatory Visit: Payer: Self-pay

## 2020-06-23 ENCOUNTER — Other Ambulatory Visit: Payer: No Typology Code available for payment source

## 2020-06-23 DIAGNOSIS — Z20822 Contact with and (suspected) exposure to covid-19: Secondary | ICD-10-CM

## 2020-06-25 LAB — SARS-COV-2, NAA 2 DAY TAT

## 2020-06-25 LAB — NOVEL CORONAVIRUS, NAA: SARS-CoV-2, NAA: NOT DETECTED

## 2020-06-29 ENCOUNTER — Ambulatory Visit (INDEPENDENT_AMBULATORY_CARE_PROVIDER_SITE_OTHER): Payer: Medicare Other | Admitting: Nurse Practitioner

## 2020-06-29 ENCOUNTER — Encounter: Payer: Self-pay | Admitting: Nurse Practitioner

## 2020-06-29 ENCOUNTER — Other Ambulatory Visit: Payer: Self-pay

## 2020-06-29 DIAGNOSIS — J189 Pneumonia, unspecified organism: Secondary | ICD-10-CM | POA: Diagnosis not present

## 2020-06-29 MED ORDER — AMOXICILLIN-POT CLAVULANATE 875-125 MG PO TABS: 1.0000 | ORAL_TABLET | Freq: Two times a day (BID) | ORAL | 0 refills | Status: DC

## 2020-06-29 NOTE — Progress Notes (Signed)
Acute Office Visit  Subjective:    Patient ID: [name], female    DOB: [birth year], 59 y.o.   MRN: [id number]  Chief Complaint  Patient presents with  . Chest Pain    X 1.5 weeks   . Shortness of Breath    HPI Patient is in today for chest pain.  Her pain is worse when she gets hot.  She rates her pain 8/10 when it is hurting, but at rest she has no pain.  She had CXR that was positive for PNA, and she took azithromycin.  Her COVID test was negative.  She states she is using her inhaler and mucinex, but isn't feeling 100% better. She states she has had nonproductive cough. She has "bullets shooting out of her breast", how she describes her chest pain.    Past Medical History:  Diagnosis Date  . Allergy    grass, dust , mold  . Anxiety   . Arthritis   . Asthma due to seasonal allergies 06/15/2020  . Bipolar disorder ([place])   . Cancer (HCC)    cervical cancer  . Carpal tunnel syndrome    Bilateral  . Chest pain 09/2011   Cardiac cath-normal coronaries  . Constipation   . Depression   . Difficulty urinating 05/31/2013  . Elevated LFTs 12/16/2013  . GERD (gastroesophageal reflux disease)   . History of kidney stones   . Hyperlipemia   . Hyperlipidemia   . Hypertension    Mild; provoked by stress and anxiety  . IBS (irritable bowel syndrome)   . Intracerebral bleed ([place])    No aneurysm; followed by Dr. Sherwood Gambler  . Loss of weight 01/06/2015  . Osteoporosis   . Stroke [facility]) 1999   hemorrhagic stroke; weakness of left side    Past Surgical History:  Procedure Laterality Date  . ABDOMINAL HYSTERECTOMY     "cancer cells"  . [place]   to remove blood clot after stroke   . CARDIAC CATHETERIZATION  2016  . CERVICAL FUSION    . CHOLECYSTECTOMY N/A 10/14/2014   Procedure: LAPAROSCOPIC CHOLECYSTECTOMY WITH INTRAOPERATIVE CHOLANGIOGRAM;  Surgeon: Jackolyn Confer, MD;  Location: [place];  Service: General;  Laterality: N/A;  . CHONDROPLASTY Right 07/13/2017    Procedure: CHONDROPLASTY of patella;  Surgeon: Carole Civil, MD;  [facility];  Service: Orthopedics;  Laterality: Right;  . EUS N/A 08/21/2015   Procedure: ESOPHAGEAL ENDOSCOPIC ULTRASOUND (EUS) RADIAL;  Surgeon: Carol Ada, MD;  [facility];  Service: Endoscopy;  Laterality: N/A;  . KNEE ARTHROSCOPY WITH MEDIAL MENISECTOMY Right 07/13/2017   Procedure: KNEE ARTHROSCOPY WITH PARTIAL MEDIAL MENISECTOMY;  Surgeon: Carole Civil, MD;  [facility];  Service: Orthopedics;  Laterality: Right;  . LEFT HEART CATHETERIZATION WITH CORONARY ANGIOGRAM N/A 09/23/2011   Procedure: LEFT HEART CATHETERIZATION WITH CORONARY ANGIOGRAM;  Surgeon: Thayer Headings, MD;  Location: [facility] CATH LAB;  Service: Cardiovascular;  Laterality: N/A;  . LIPOMA EXCISION Left 11/18/2013   Procedure: EXCISION OF SOFT TISSUE MASS-LEFT THIGH;  Surgeon: Jamesetta So, MD;  Location: [facility];  Service: General;  Laterality: Left;  . RECTOCELE REPAIR     x2  . RECTOCELE REPAIR N/A 04/04/2017   Procedure: POSTERIOR REPAIR (RECTOCELE);  Surgeon: Jonnie Kind, MD;  Location: [facility];  Service: Gynecology;  Laterality: N/A;    Family History  Problem Relation Age of Onset  . Cancer Mother        breast   .  Hypertension Mother   . Hyperlipidemia Mother   . Depression Mother   . Anxiety disorder Mother   . COPD Mother   . Arthritis Mother        rheumatoid  . Drug abuse Sister   . Coronary artery disease Paternal Grandfather   . Coronary artery disease Paternal Uncle   . Depression Cousin   . Drug abuse Cousin     Social History   Socioeconomic History  . Marital status: Married    Spouse name: Sonia Side  . Number of children: 0  . Years of education: HS  . Highest education level: Not on file  Occupational History  . Occupation: unemployed    Comment: pending disability  Tobacco Use  . Smoking status: Former Smoker    Packs/day: 1.00    Years: 19.00    Pack years: 19.00    Types:  Cigarettes    Quit date: 09/01/1997    Years since quitting: 22.8  . Smokeless tobacco: Never Used  . Tobacco comment: Quit smoking 1999 , previous 20 pack years  Vaping Use  . Vaping Use: Never used  Substance and Sexual Activity  . Alcohol use: Yes    Comment: 1 drink every other week  . Drug use: No  . Sexual activity: Not Currently    Partners: Male    Birth control/protection: Surgical    Comment: hyst   Other Topics Concern  . Not on file  Social History Narrative   Currently unable to work   Lives in [place]   Married   Patient drinks 1 cup of caffeine daily.   Patient is right handed.    [employer]ined the [employer] to get more exercise   Social Determinants of Health   Financial Resource Strain: Not on file  Food Insecurity: Not on file  Transportation Needs: Not on file  Physical Activity: Not on file  Stress: Not on file  Social Connections: Not on file  Intimate Partner Violence: Not on file    Outpatient Medications Prior to Visit  Medication Sig Dispense Refill  . acetaminophen (TYLENOL) 500 MG tablet Take 1,000 mg by mouth every 6 (six) hours as needed for moderate pain.     Marland Kitchen ALPRAZolam (XANAX) 1 MG tablet Take 1 tablet (1 mg total) by mouth 3 (three) times daily. 90 tablet 2  . amLODipine (NORVASC) 5 MG tablet Take 5 mg by mouth daily.    Marland Kitchen atorvastatin (LIPITOR) 20 MG tablet Take 20 mg by mouth daily.    Marland Kitchen azelastine (ASTELIN) 0.1 % nasal spray 2 sprays each nostril 1-2 times daily as needed 30 mL 5  . azithromycin (ZITHROMAX) 250 MG tablet Take as package instructions 6 tablet 0  . B Complex-C (SUPER B COMPLEX PO) Take 1 tablet by mouth daily.     . cloNIDine (CATAPRES) 0.2 MG tablet Take 0.2 mg by mouth 2 (two) times daily. As needed    . diclofenac sodium (VOLTAREN) 1 % GEL Apply 2 g topically 4 (four) times daily. 100 g 0  . docusate sodium (COLACE) 100 MG capsule Take 100 mg by mouth daily.    . fluconazole (DIFLUCAN) 150 MG tablet Take 1 now and repeat 1 in 3  days 2 tablet 1  . fluticasone (FLOVENT HFA) 44 MCG/ACT inhaler Inhale 2 puffs into the lungs in the morning and at bedtime. 1 each 12  . levalbuterol (XOPENEX HFA) 45 MCG/ACT inhaler Inhale 2 puffs into the lungs every 4 (four) hours as needed  for wheezing. 15 g 1  . lisinopril (PRINIVIL,ZESTRIL) 20 MG tablet Take 20 mg by mouth daily.     . methylphenidate (RITALIN) 20 MG tablet Take 1 tablet (20 mg total) by mouth 2 (two) times daily with breakfast and lunch. 60 tablet 0  . methylphenidate (RITALIN) 20 MG tablet Take 1 tablet (20 mg total) by mouth 2 (two) times daily with breakfast and lunch. 60 tablet 0  . methylphenidate (RITALIN) 20 MG tablet Take 1 tablet (20 mg total) by mouth 2 (two) times daily with breakfast and lunch. 60 tablet 0  . montelukast (SINGULAIR) 10 MG tablet Take 1 tablet (10 mg total) by mouth at bedtime. 30 tablet 3  . Multiple Vitamin (MULITIVITAMIN WITH MINERALS) TABS Take 1 tablet by mouth daily.    . nabumetone (RELAFEN) 500 MG tablet Take 500 mg by mouth every 8 (eight) hours as needed.    Marland Kitchen omeprazole (PRILOSEC) 40 MG capsule Take 40 mg by mouth daily.    Marland Kitchen oxybutynin (DITROPAN-XL) 10 MG 24 hr tablet Take 10 mg by mouth at bedtime.    . Potassium 99 MG TABS Take 99 mg by mouth daily.     . traZODone (DESYREL) 150 MG tablet Take 1 tablet (150 mg total) by mouth at bedtime. 90 tablet 2  . venlafaxine XR (EFFEXOR XR) 150 MG 24 hr capsule Take 1 capsule (150 mg total) by mouth daily with breakfast. 90 capsule 2   No facility-administered medications prior to visit.    Allergies  Allergen Reactions  . Morphine And Related Hives  . Promethazine Hcl Other (See Comments)    Causes patient to become Hyper    Review of Systems  Constitutional: Negative.   HENT: Negative for congestion, rhinorrhea, sinus pressure and sinus pain.   Respiratory: Positive for chest tightness. Negative for cough, shortness of breath and wheezing.   Cardiovascular: Positive for chest  pain.       Per HPI       Objective:    Physical Exam Constitutional:      Appearance: She is well-developed.  Cardiovascular:     Rate and Rhythm: Normal rate and regular rhythm.     Heart sounds: Normal heart sounds.  Pulmonary:     Effort: Pulmonary effort is normal.     Breath sounds: No decreased breath sounds, wheezing, rhonchi or rales.  Neurological:     Mental Status: She is alert.     BP 129/84   Pulse 88   Temp 98.4 F (36.9 C)   Resp 20   Ht 5' 3"  (1.6 m)   Wt 147 lb (66.7 kg)   SpO2 94%   BMI 26.04 kg/m  Wt Readings from Last 3 Encounters:  06/29/20 147 lb (66.7 kg)  06/15/20 148 lb 1.9 oz (67.2 kg)  03/16/20 150 lb (68 kg)    Health Maintenance Due  Topic Date Due  . Hepatitis C Screening  Never done  . COLONOSCOPY (Pts 45-74yr Insurance coverage will need to be confirmed)  10/12/2015  . COVID-19 Vaccine (3 - Booster for Pfizer series) 04/19/2020    There are no preventive care reminders to display for this patient.   Lab Results  Component Value Date   TSH 1.438 09/20/2011   Lab Results  Component Value Date   WBC 7.5 07/10/2017   HGB 13.3 07/10/2017   HCT 39.9 07/10/2017   MCV 95.7 07/10/2017   PLT 304 07/10/2017   Lab Results  Component Value Date  NA 138 07/10/2017   K 3.8 07/10/2017   CO2 26 07/10/2017   GLUCOSE 105 (H) 07/10/2017   BUN 14 07/10/2017   CREATININE 0.88 07/10/2017   BILITOT 0.9 06/02/2017   ALKPHOS 80 06/02/2017   AST 19 06/02/2017   ALT 17 06/02/2017   PROT 7.4 06/02/2017   ALBUMIN 5.1 06/02/2017   CALCIUM 9.3 07/10/2017   ANIONGAP 11 07/10/2017   Lab Results  Component Value Date   CHOL 206 (H) 06/02/2017   Lab Results  Component Value Date   HDL 76 06/02/2017   Lab Results  Component Value Date   LDLCALC 102 (H) 06/02/2017   Lab Results  Component Value Date   TRIG 141 06/02/2017   Lab Results  Component Value Date   CHOLHDL 2.3 09/16/2015   Lab Results  Component Value Date    HGBA1C 5.3 06/02/2017       Assessment & Plan:   Problem List Items Addressed This Visit      Respiratory   Pneumonia    -she had CXR that showed atypical PNA in bilateral upper lobes -she still has some residual chest pain despite taking a z-pack -Rx. augmentin -she has albuterol inhaler already, continue this -f/u in 1 week if no improvement      Relevant Medications   amoxicillin-clavulanate (AUGMENTIN) 875-125 MG tablet       Meds ordered this encounter  Medications  . amoxicillin-clavulanate (AUGMENTIN) 875-125 MG tablet    Sig: Take 1 tablet by mouth 2 (two) times daily.    Dispense:  20 tablet    Refill:  0     Noreene Larsson, NP

## 2020-06-29 NOTE — Assessment & Plan Note (Signed)
-  she had CXR that showed atypical PNA in bilateral upper lobes -she still has some residual chest pain despite taking a z-pack -Rx. augmentin -she has albuterol inhaler already, continue this -f/u in 1 week if no improvement

## 2020-07-06 ENCOUNTER — Other Ambulatory Visit: Payer: Self-pay | Admitting: Internal Medicine

## 2020-07-06 ENCOUNTER — Encounter: Payer: Self-pay | Admitting: Internal Medicine

## 2020-07-06 DIAGNOSIS — K219 Gastro-esophageal reflux disease without esophagitis: Secondary | ICD-10-CM

## 2020-07-06 DIAGNOSIS — E785 Hyperlipidemia, unspecified: Secondary | ICD-10-CM

## 2020-07-06 DIAGNOSIS — J45909 Unspecified asthma, uncomplicated: Secondary | ICD-10-CM

## 2020-07-06 MED ORDER — ATORVASTATIN CALCIUM 20 MG PO TABS: 20.0000 mg | ORAL_TABLET | Freq: Every day | ORAL | 1 refills | Status: DC

## 2020-07-06 MED ORDER — OMEPRAZOLE 40 MG PO CPDR: 40.0000 mg | DELAYED_RELEASE_CAPSULE | Freq: Every day | ORAL | 1 refills | Status: DC

## 2020-07-07 ENCOUNTER — Encounter: Payer: Self-pay | Admitting: Allergy & Immunology

## 2020-07-08 ENCOUNTER — Encounter (HOSPITAL_COMMUNITY): Payer: Self-pay | Admitting: Psychiatry

## 2020-07-08 ENCOUNTER — Other Ambulatory Visit: Payer: Self-pay

## 2020-07-08 ENCOUNTER — Encounter: Payer: Self-pay | Admitting: Internal Medicine

## 2020-07-08 ENCOUNTER — Telehealth (INDEPENDENT_AMBULATORY_CARE_PROVIDER_SITE_OTHER): Payer: PPO | Admitting: Psychiatry

## 2020-07-08 DIAGNOSIS — F332 Major depressive disorder, recurrent severe without psychotic features: Secondary | ICD-10-CM | POA: Diagnosis not present

## 2020-07-08 MED ORDER — ALPRAZOLAM 1 MG PO TABS: 1.0000 mg | ORAL_TABLET | Freq: Three times a day (TID) | ORAL | 2 refills | Status: DC

## 2020-07-08 MED ORDER — METHYLPHENIDATE HCL 20 MG PO TABS: 20.0000 mg | ORAL_TABLET | Freq: Two times a day (BID) | ORAL | 0 refills | Status: DC

## 2020-07-08 MED ORDER — VENLAFAXINE HCL ER 150 MG PO CP24: 150.0000 mg | ORAL_CAPSULE | Freq: Every day | ORAL | 2 refills | Status: DC

## 2020-07-08 MED ORDER — METHYLPHENIDATE HCL 20 MG PO TABS
20.0000 mg | ORAL_TABLET | Freq: Two times a day (BID) | ORAL | 0 refills | Status: DC
Start: 2020-07-08 — End: 2020-10-06

## 2020-07-08 MED ORDER — TRAZODONE HCL 150 MG PO TABS
150.0000 mg | ORAL_TABLET | Freq: Every day | ORAL | 2 refills | Status: DC
Start: 2020-07-08 — End: 2020-10-06

## 2020-07-08 NOTE — Progress Notes (Signed)
Virtual Visit via Telephone Note  I connected with [place] on 07/08/20 at  9:40 AM EST by telephone and verified that I am speaking with the correct person using two identifiers.  Location: Patient: home Provider: home   I discussed the limitations, risks, security and privacy concerns of performing an evaluation and management service by telephone and the availability of in person appointments. I also discussed with the patient that there may be a patient responsible charge related to this service. The patient expressed understanding and agreed to proceed.    I discussed the assessment and treatment plan with the patient. The patient was provided an opportunity to ask questions and all were answered. The patient agreed with the plan and demonstrated an understanding of the instructions.   The patient was advised to call back or seek an in-person evaluation if the symptoms worsen or if the condition fails to improve as anticipated.  I provided 15 minutes of non-face-to-face time during this encounter.   [name], MD  [facility] MD/PA/NP OP Progress Note  07/08/2020 10:14 AM [name]  MRN:  [id number]  Chief Complaint:  Chief Complaint    Depression; Anxiety     HPI: This patient is a 60 year old married white female who lives with her husband in [place]. She has no children. 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  The patient was referred by her primary physician, Dr. [name], for further assessment and treatment of depression anxiety and focus problems.  The patient states that she did well most of her life until she had a stroke in 64 at age 60. She had a cerebral aneurysm that burst and she had to have a hematoma evacuated from the right frontal lobe. She has had resulting difficulties ever since and still has weakness on the left side of her body and poor fine motor skills in her left hand. She is right  handed. She states that she gets older her symptoms worsen.  The patient often feels anxious particular in crowds. She has frequent panic attacks. She takes Xanax 1 mg twice a day but is reluctant to take more. Shortly after she had her stroke she saw psychiatrist in [place] and was placed on the Xanax. Dr. [name] later put her on Effexor and she is now up to 150 mg per day. [name] not sure if it's helping. She has difficulty sleeping but trazodone helps to some degree. She also is significant problems with focus and attention span. Her thoughts ramble. She'll start one thing and go the next. She can't complete tasks. She finds this extremely frustrating. Her mood is labile at times and she'll get angry quickly and for no reason. She denies auditory or visual hallucinations or paranoia. She does not use drugs and very rarely takes a drink. She admits that time she has passive suicidal ideation but would never hurt her self because of her faith  The patient returns for follow-up after 3 months.  She states that she and her husband both have pneumonia.  They are negative for coronavirus however.  She is more worried about her husband is still seems to have some shortness of breath.  I urged her to call his primary physician.  She states that her mood has been stable and she denies severe depression or anxiety.  She is sleeping very well.  For the most part the methylphenidate has helped her energy and focus although she has  very little energy now while she is sick Visit Diagnosis:    ICD-10-CM   1. Major depressive disorder, recurrent, severe without psychotic features ([place])  F33.2     Past Psychiatric History: Past outpatient treatment for depression  Past Medical History:  Past Medical History:  Diagnosis Date  . Allergy    grass, dust , mold  . Anxiety   . Arthritis   . Asthma due to seasonal allergies 06/15/2020  . Bipolar disorder ([place])   . Cancer (HCC)    cervical cancer  . Carpal  tunnel syndrome    Bilateral  . Chest pain 09/2011   Cardiac cath-normal coronaries  . Constipation   . Depression   . Difficulty urinating 05/31/2013  . Elevated LFTs 12/16/2013  . GERD (gastroesophageal reflux disease)   . History of kidney stones   . Hyperlipemia   . Hyperlipidemia   . Hypertension    Mild; provoked by stress and anxiety  . IBS (irritable bowel syndrome)   . Intracerebral bleed ([place])    No aneurysm; followed by Dr. Sherwood Gambler  . Loss of weight 01/06/2015  . Osteoporosis   . Stroke [facility]) 1999   hemorrhagic stroke; weakness of left side    Past Surgical History:  Procedure Laterality Date  . ABDOMINAL HYSTERECTOMY     "cancer cells"  . [place]   to remove blood clot after stroke   . CARDIAC CATHETERIZATION  2016  . CERVICAL FUSION    . CHOLECYSTECTOMY N/A 10/14/2014   Procedure: LAPAROSCOPIC CHOLECYSTECTOMY WITH INTRAOPERATIVE CHOLANGIOGRAM;  Surgeon: Jackolyn Confer, MD;  Location: [place];  Service: General;  Laterality: N/A;  . CHONDROPLASTY Right 07/13/2017   Procedure: CHONDROPLASTY of patella;  Surgeon: Carole Civil, MD;  [facility];  Service: Orthopedics;  Laterality: Right;  . EUS N/A 08/21/2015   Procedure: ESOPHAGEAL ENDOSCOPIC ULTRASOUND (EUS) RADIAL;  Surgeon: Carol Ada, MD;  [facility];  Service: Endoscopy;  Laterality: N/A;  . KNEE ARTHROSCOPY WITH MEDIAL MENISECTOMY Right 07/13/2017   Procedure: KNEE ARTHROSCOPY WITH PARTIAL MEDIAL MENISECTOMY;  Surgeon: Carole Civil, MD;  [facility]: [facility];  Service: Orthopedics;  Laterality: Right;  . LEFT HEART CATHETERIZATION WITH CORONARY ANGIOGRAM N/A 09/23/2011   Procedure: LEFT HEART CATHETERIZATION WITH CORONARY ANGIOGRAM;  Surgeon: Thayer Headings, MD;  Location: [facility] CATH LAB;  Service: Cardiovascular;  Laterality: N/A;  . LIPOMA EXCISION Left 11/18/2013   Procedure: EXCISION OF SOFT TISSUE MASS-LEFT THIGH;  Surgeon: Jamesetta So, MD;  [facility];  Service:  General;  Laterality: Left;  . RECTOCELE REPAIR     x2  . RECTOCELE REPAIR N/A 04/04/2017   Procedure: POSTERIOR REPAIR (RECTOCELE);  Surgeon: Jonnie Kind, MD;  [facility];  Service: Gynecology;  Laterality: N/A;    Family Psychiatric History: See below  Family History:  Family History  Problem Relation Age of Onset  . Cancer Mother        breast   . Hypertension Mother   . Hyperlipidemia Mother   . Depression Mother   . Anxiety disorder Mother   . COPD Mother   . Arthritis Mother        rheumatoid  . Drug abuse Sister   . Coronary artery disease Paternal Grandfather   . Coronary artery disease Paternal Uncle   . Depression Cousin   . Drug abuse Cousin     Social History:  Social History   Socioeconomic History  . Marital status: Married  Spouse name: Sonia Side  . Number of children: 0  . Years of education: HS  . Highest education level: Not on file  Occupational History  . Occupation: unemployed    Comment: pending disability  Tobacco Use  . Smoking status: Former Smoker    Packs/day: 1.00    Years: 19.00    Pack years: 19.00    Types: Cigarettes    Quit date: 09/01/1997    Years since quitting: 22.8  . Smokeless tobacco: Never Used  . Tobacco comment: Quit smoking 1999 , previous 20 pack years  Vaping Use  . Vaping Use: Never used  Substance and Sexual Activity  . Alcohol use: Yes    Comment: 1 drink every other week  . Drug use: No  . Sexual activity: Not Currently    Partners: Male    Birth control/protection: Surgical    Comment: hyst   Other Topics Concern  . Not on file  Social History Narrative   Currently unable to work   Lives in [place]   Married   Patient drinks 1 cup of caffeine daily.   Patient is right handed.    [employer]ined the [employer] to get more exercise   Social Determinants of Health   Financial Resource Strain: Not on file  Food Insecurity: Not on file  Transportation Needs: Not on file  Physical Activity: Not on file   Stress: Not on file  Social Connections: Not on file    Allergies:  Allergies  Allergen Reactions  . Morphine And Related Hives  . Promethazine Hcl Other (See Comments)    Causes patient to become Hyper    Metabolic Disorder Labs: Lab Results  Component Value Date   HGBA1C 5.3 06/02/2017   MPG 117 (H) 01/06/2015   MPG 117 (H) 02/17/2014   No results found for: PROLACTIN Lab Results  Component Value Date   CHOL 206 (H) 06/02/2017   TRIG 141 06/02/2017   HDL 76 06/02/2017   CHOLHDL 2.3 09/16/2015   VLDL 26 09/16/2015   LDLCALC 102 (H) 06/02/2017   LDLCALC 69 09/16/2015   Lab Results  Component Value Date   TSH 1.438 09/20/2011    Therapeutic Level Labs: No results found for: LITHIUM No results found for: VALPROATE No components found for:  CBMZ  Current Medications: Current Outpatient Medications  Medication Sig Dispense Refill  . acetaminophen (TYLENOL) 500 MG tablet Take 1,000 mg by mouth every 6 (six) hours as needed for moderate pain.     Marland Kitchen ALPRAZolam (XANAX) 1 MG tablet Take 1 tablet (1 mg total) by mouth 3 (three) times daily. 90 tablet 2  . amLODipine (NORVASC) 5 MG tablet Take 5 mg by mouth daily.    Marland Kitchen amoxicillin-clavulanate (AUGMENTIN) 875-125 MG tablet Take 1 tablet by mouth 2 (two) times daily. 20 tablet 0  . atorvastatin (LIPITOR) 20 MG tablet Take 1 tablet (20 mg total) by mouth daily. 90 tablet 1  . azelastine (ASTELIN) 0.1 % nasal spray 2 sprays each nostril 1-2 times daily as needed 30 mL 5  . azithromycin (ZITHROMAX) 250 MG tablet Take as package instructions 6 tablet 0  . B Complex-C (SUPER B COMPLEX PO) Take 1 tablet by mouth daily.     . cloNIDine (CATAPRES) 0.2 MG tablet Take 0.2 mg by mouth 2 (two) times daily. As needed    . diclofenac sodium (VOLTAREN) 1 % GEL Apply 2 g topically 4 (four) times daily. 100 g 0  . docusate sodium (COLACE) 100 MG  capsule Take 100 mg by mouth daily.    . fluconazole (DIFLUCAN) 150 MG tablet Take 1 now and  repeat 1 in 3 days 2 tablet 1  . fluticasone (FLOVENT HFA) 44 MCG/ACT inhaler Inhale 2 puffs into the lungs in the morning and at bedtime. 1 each 12  . levalbuterol (XOPENEX HFA) 45 MCG/ACT inhaler Inhale 2 puffs into the lungs every 4 (four) hours as needed for wheezing. 15 g 1  . lisinopril (PRINIVIL,ZESTRIL) 20 MG tablet Take 20 mg by mouth daily.     . methylphenidate (RITALIN) 20 MG tablet Take 1 tablet (20 mg total) by mouth 2 (two) times daily with breakfast and lunch. 60 tablet 0  . methylphenidate (RITALIN) 20 MG tablet Take 1 tablet (20 mg total) by mouth 2 (two) times daily with breakfast and lunch. 60 tablet 0  . methylphenidate (RITALIN) 20 MG tablet Take 1 tablet (20 mg total) by mouth 2 (two) times daily with breakfast and lunch. 60 tablet 0  . montelukast (SINGULAIR) 10 MG tablet Take 1 tablet (10 mg total) by mouth at bedtime. 30 tablet 3  . Multiple Vitamin (MULITIVITAMIN WITH MINERALS) TABS Take 1 tablet by mouth daily.    . nabumetone (RELAFEN) 500 MG tablet Take 500 mg by mouth every 8 (eight) hours as needed.    Marland Kitchen omeprazole (PRILOSEC) 40 MG capsule Take 1 capsule (40 mg total) by mouth daily. 90 capsule 1  . oxybutynin (DITROPAN-XL) 10 MG 24 hr tablet Take 10 mg by mouth at bedtime.    . Potassium 99 MG TABS Take 99 mg by mouth daily.     . traZODone (DESYREL) 150 MG tablet Take 1 tablet (150 mg total) by mouth at bedtime. 90 tablet 2  . venlafaxine XR (EFFEXOR XR) 150 MG 24 hr capsule Take 1 capsule (150 mg total) by mouth daily with breakfast. 90 capsule 2   No current facility-administered medications for this visit.     Musculoskeletal: Strength & Muscle Tone: within normal limits Gait & Station: normal Patient leans: N/A  Psychiatric Specialty Exam: Review of Systems  HENT: Positive for congestion.   All other systems reviewed and are negative.   There were no vitals taken for this visit.There is no height or weight on file to calculate BMI.  General  Appearance: NA  Eye Contact:  NA  Speech:  Clear and Coherent  Volume:  Normal  Mood:  Anxious  Affect:  NA  Thought Process:  Goal Directed  Orientation:  Full (Time, Place, and Person)  Thought Content: Rumination   Suicidal Thoughts:  No  Homicidal Thoughts:  No  Memory:  Immediate;   Good Recent;   Good Remote;   Good  Judgement:  Good  Insight:  Good  Psychomotor Activity:  Normal  Concentration:  Concentration: Good and Attention Span: Good  Recall:  Good  Fund of Knowledge: Good  Language: Good  Akathisia:  No  Handed:  Right  AIMS (if indicated): not done  Assets:  Communication Skills Desire for Improvement Resilience Social Support Talents/Skills  ADL's:  Intact  Cognition: WNL  Sleep:  Good   Screenings: MDI   Flowsheet Row Office Visit from 01/22/2016 in [facility]  Total Score (max 50) 34    Mini-Mental   Flowsheet Row Office Visit from 02/13/2015 in [facility]  Total Score (max 30 points ) 26    PHQ2-9   [place] Visit from 06/29/2020 in [place]  Video Visit from 06/19/2020 in [facility] Office Visit from 06/15/2020 in [facility] Office Visit from 05/09/2017 in [facility] Office Visit from 09/16/2015 in [place]  PHQ-2 Total Score 0 0 0 0 4  PHQ-9 Total Score 1 -- 6 -- 10    SBQ-R   [place] Office Visit from 01/22/2016 in [facility]  SBQ-R Total Score 14.1       Assessment and Plan: This patient is a 60 year old female with a history of depression anxiety and difficulties with focus.  She seems to be doing okay from a psychiatric standpoint although she is recovering from pneumonia.  She will continue Effexor XR 150 mg daily for depression, Xanax 1 mg 3 times daily for anxiety and trazodone 150 mg at bedtime for sleep as well as methylphenidate 20 mg twice  daily for focus and alertness.  She will return to see me in 3 months   [name], MD 07/08/2020, 10:14 AM

## 2020-07-09 ENCOUNTER — Telehealth (INDEPENDENT_AMBULATORY_CARE_PROVIDER_SITE_OTHER): Payer: PPO | Admitting: Internal Medicine

## 2020-07-09 ENCOUNTER — Other Ambulatory Visit: Payer: Self-pay

## 2020-07-09 ENCOUNTER — Encounter: Payer: Self-pay | Admitting: Internal Medicine

## 2020-07-09 DIAGNOSIS — J189 Pneumonia, unspecified organism: Secondary | ICD-10-CM | POA: Diagnosis not present

## 2020-07-09 DIAGNOSIS — R053 Chronic cough: Secondary | ICD-10-CM | POA: Diagnosis not present

## 2020-07-09 NOTE — Progress Notes (Signed)
foll

## 2020-07-09 NOTE — Patient Instructions (Signed)
Please continue to use Flovent regularly and use Levalbuterol as needed for shortness of breath/wheezing.  Okay to take Mucinex for cough.  Please contact if cough persists after 2 weeks. Please get medical attention if new symptoms like fever, chills, night sweats, chest pain or palpitations appear.

## 2020-07-09 NOTE — Telephone Encounter (Signed)
Pt and husband appt moved to 1-6 with patel virtually

## 2020-07-09 NOTE — Progress Notes (Signed)
Virtual Visit via Telephone Note   This visit type was conducted due to national recommendations for restrictions regarding the COVID-19 Pandemic (e.g. social distancing) in an effort to limit this patient's exposure and mitigate transmission in our community.  Due to her co-morbid illnesses, this patient is at least at moderate risk for complications without adequate follow up.  This format is felt to be most appropriate for this patient at this time.  The patient did not have access to video technology/had technical difficulties with video requiring transitioning to audio format only (telephone).  All issues noted in this document were discussed and addressed.  No physical exam could be performed with this format.  Evaluation Performed:  Follow-up visit  Date:  07/09/2020   ID:  [name], [name] 1961/03/27, MRN [id number]  Patient Location: Home Provider Location: Office/Clinic  Participants: Patient Location of Patient: Home Location of Provider: Telehealth Consent was obtain for visit to be over via telehealth. I verified that I am speaking with the correct person using two identifiers.  PCP:  Lindell Spar, MD   Chief Complaint:  Cough  History of Present Illness:    [name] is a 60 y.o. female with  PMH of HTN, asthma due to seasonal allergies, h/o hemorrhagic stroke in 1999 (unclear etiology), HLD, anxiety with depression, urinary incontinence and GERD  who has a televisit for complaint of chest tightness and cough.  Patient was prescribed Augmentin for atypical pneumonia in the previous visit.  She has completed Augmentin now.  She denies dyspnea, nasal congestion, fever or chills now.  She continues to have dry cough and chest tightness along with the cough.  She complains of chest pain with severe coughing.  She denies any hemoptysis.  She denies chest pressure or palpitations.  She has been using Flovent and as needed levalbuterol for her asthma.  The patient  does not have symptoms concerning for COVID-19 infection (fever, chills, cough, or new shortness of breath).   Past Medical, Surgical, Social History, Allergies, and Medications have been Reviewed.  Past Medical History:  Diagnosis Date  . Allergy    grass, dust , mold  . Anxiety   . Arthritis   . Asthma due to seasonal allergies 06/15/2020  . Bipolar disorder ([place])   . Cancer (HCC)    cervical cancer  . Carpal tunnel syndrome    Bilateral  . Chest pain 09/2011   Cardiac cath-normal coronaries  . Constipation   . Depression   . Difficulty urinating 05/31/2013  . Elevated LFTs 12/16/2013  . GERD (gastroesophageal reflux disease)   . History of kidney stones   . Hyperlipemia   . Hyperlipidemia   . Hypertension    Mild; provoked by stress and anxiety  . IBS (irritable bowel syndrome)   . Intracerebral bleed ([place])    No aneurysm; followed by Dr. Sherwood Gambler  . Loss of weight 01/06/2015  . Osteoporosis   . Stroke [facility]) 1999   hemorrhagic stroke; weakness of left side   Past Surgical History:  Procedure Laterality Date  . ABDOMINAL HYSTERECTOMY     "cancer cells"  . [place]   to remove blood clot after stroke   . CARDIAC CATHETERIZATION  2016  . CERVICAL FUSION    . CHOLECYSTECTOMY N/A 10/14/2014   Procedure: LAPAROSCOPIC CHOLECYSTECTOMY WITH INTRAOPERATIVE CHOLANGIOGRAM;  Surgeon: Jackolyn Confer, MD;  Location: [place];  Service: General;  Laterality: N/A;  . CHONDROPLASTY Right 07/13/2017  Procedure: CHONDROPLASTY of patella;  Surgeon: Carole Civil, MD;  [facility];  Service: Orthopedics;  Laterality: Right;  . EUS N/A 08/21/2015   Procedure: ESOPHAGEAL ENDOSCOPIC ULTRASOUND (EUS) RADIAL;  Surgeon: Carol Ada, MD;  [facility];  Service: Endoscopy;  Laterality: N/A;  . KNEE ARTHROSCOPY WITH MEDIAL MENISECTOMY Right 07/13/2017   Procedure: KNEE ARTHROSCOPY WITH PARTIAL MEDIAL MENISECTOMY;  Surgeon: Carole Civil, MD;  [facility];   Service: Orthopedics;  Laterality: Right;  . LEFT HEART CATHETERIZATION WITH CORONARY ANGIOGRAM N/A 09/23/2011   Procedure: LEFT HEART CATHETERIZATION WITH CORONARY ANGIOGRAM;  Surgeon: Thayer Headings, MD;  Location: [facility] CATH LAB;  Service: Cardiovascular;  Laterality: N/A;  . LIPOMA EXCISION Left 11/18/2013   Procedure: EXCISION OF SOFT TISSUE MASS-LEFT THIGH;  Surgeon: Jamesetta So, MD;  [facility];  Service: General;  Laterality: Left;  . RECTOCELE REPAIR     x2  . RECTOCELE REPAIR N/A 04/04/2017   Procedure: POSTERIOR REPAIR (RECTOCELE);  Surgeon: Jonnie Kind, MD;  [facility];  Service: Gynecology;  Laterality: N/A;     Current Meds  Medication Sig  . acetaminophen (TYLENOL) 500 MG tablet Take 1,000 mg by mouth every 6 (six) hours as needed for moderate pain.   Marland Kitchen ALPRAZolam (XANAX) 1 MG tablet Take 1 tablet (1 mg total) by mouth 3 (three) times daily.  Marland Kitchen amLODipine (NORVASC) 5 MG tablet Take 5 mg by mouth daily.  Marland Kitchen amoxicillin-clavulanate (AUGMENTIN) 875-125 MG tablet Take 1 tablet by mouth 2 (two) times daily.  Marland Kitchen atorvastatin (LIPITOR) 20 MG tablet Take 1 tablet (20 mg total) by mouth daily.  Marland Kitchen azelastine (ASTELIN) 0.1 % nasal spray 2 sprays each nostril 1-2 times daily as needed  . azithromycin (ZITHROMAX) 250 MG tablet Take as package instructions  . B Complex-C (SUPER B COMPLEX PO) Take 1 tablet by mouth daily.   . cloNIDine (CATAPRES) 0.2 MG tablet Take 0.2 mg by mouth 2 (two) times daily. As needed  . diclofenac sodium (VOLTAREN) 1 % GEL Apply 2 g topically 4 (four) times daily.  Marland Kitchen docusate sodium (COLACE) 100 MG capsule Take 100 mg by mouth daily.  . fluconazole (DIFLUCAN) 150 MG tablet Take 1 now and repeat 1 in 3 days  . fluticasone (FLOVENT HFA) 44 MCG/ACT inhaler Inhale 2 puffs into the lungs in the morning and at bedtime.  . levalbuterol (XOPENEX HFA) 45 MCG/ACT inhaler Inhale 2 puffs into the lungs every 4 (four) hours as needed for wheezing.  Marland Kitchen lisinopril  (PRINIVIL,ZESTRIL) 20 MG tablet Take 20 mg by mouth daily.   . methylphenidate (RITALIN) 20 MG tablet Take 1 tablet (20 mg total) by mouth 2 (two) times daily with breakfast and lunch.  . methylphenidate (RITALIN) 20 MG tablet Take 1 tablet (20 mg total) by mouth 2 (two) times daily with breakfast and lunch.  . methylphenidate (RITALIN) 20 MG tablet Take 1 tablet (20 mg total) by mouth 2 (two) times daily with breakfast and lunch.  . montelukast (SINGULAIR) 10 MG tablet Take 1 tablet (10 mg total) by mouth at bedtime.  . Multiple Vitamin (MULITIVITAMIN WITH MINERALS) TABS Take 1 tablet by mouth daily.  . nabumetone (RELAFEN) 500 MG tablet Take 500 mg by mouth every 8 (eight) hours as needed.  Marland Kitchen omeprazole (PRILOSEC) 40 MG capsule Take 1 capsule (40 mg total) by mouth daily.  Marland Kitchen oxybutynin (DITROPAN-XL) 10 MG 24 hr tablet Take 10 mg by mouth at bedtime.  . Potassium 99  MG TABS Take 99 mg by mouth daily.   . traZODone (DESYREL) 150 MG tablet Take 1 tablet (150 mg total) by mouth at bedtime.  Marland Kitchen venlafaxine XR (EFFEXOR XR) 150 MG 24 hr capsule Take 1 capsule (150 mg total) by mouth daily with breakfast.     Allergies:   Morphine and related and Promethazine hcl   ROS:   Please see the history of present illness.     All other systems reviewed and are negative.   Labs/Other Tests and Data Reviewed:    Recent Labs: No results found for requested labs within last 8760 hours.   Recent Lipid Panel Lab Results  Component Value Date/Time   CHOL 206 (H) 06/02/2017 09:22 AM   TRIG 141 06/02/2017 09:22 AM   HDL 76 06/02/2017 09:22 AM   CHOLHDL 2.3 09/16/2015 09:39 AM   LDLCALC 102 (H) 06/02/2017 09:22 AM    Wt Readings from Last 3 Encounters:  06/29/20 147 lb (66.7 kg)  06/15/20 148 lb 1.9 oz (67.2 kg)  03/16/20 150 lb (68 kg)      ASSESSMENT & PLAN:    Atypical pneumonia Chronic cough, likely postinfectious in etiology S/p Augmentin Mucinex or Robitussin as needed for  cough Tylenol for pain/fever/fatigue Advised to wait for 2-4 weeks for postinfectious cough to resolve, if persistent, will prescribe short-term steroids Advised to get medical attention if new symptoms appear. Advised to continue to use Flovent and as needed levalbuterol   Time:   Today, I have spent 15 minutes reviewing the chart, including problem list, medications, and with the patient with telehealth technology discussing the above problems.   Medication Adjustments/Labs and Tests Ordered: Current medicines are reviewed at length with the patient today.  Concerns regarding medicines are outlined above.   Tests Ordered: No orders of the defined types were placed in this encounter.   Medication Changes: No orders of the defined types were placed in this encounter.    Note: This dictation was prepared with Dragon dictation along with smaller phrase technology. Similar sounding words can be transcribed inadequately or may not be corrected upon review. Any transcriptional errors that result from this process are unintentional.      Disposition:  Follow up  Signed, Lindell Spar, MD  07/09/2020 12:32 PM     [facility]

## 2020-07-13 ENCOUNTER — Other Ambulatory Visit: Payer: Self-pay | Admitting: Nurse Practitioner

## 2020-07-15 ENCOUNTER — Ambulatory Visit: Payer: No Typology Code available for payment source | Admitting: Internal Medicine

## 2020-07-16 ENCOUNTER — Other Ambulatory Visit: Payer: Self-pay | Admitting: Internal Medicine

## 2020-07-16 ENCOUNTER — Encounter: Payer: Self-pay | Admitting: Internal Medicine

## 2020-07-16 DIAGNOSIS — R053 Chronic cough: Secondary | ICD-10-CM

## 2020-07-16 MED ORDER — PREDNISONE 10 MG PO TABS: ORAL_TABLET | ORAL | 0 refills | Status: DC

## 2020-07-17 ENCOUNTER — Other Ambulatory Visit: Payer: Self-pay | Admitting: *Deleted

## 2020-07-17 MED ORDER — LEVALBUTEROL TARTRATE 45 MCG/ACT IN AERO: 2.0000 | INHALATION_SPRAY | RESPIRATORY_TRACT | 0 refills | Status: DC | PRN

## 2020-07-17 MED ORDER — PREDNISONE 10 MG PO TABS: ORAL_TABLET | ORAL | 0 refills | Status: AC

## 2020-07-17 NOTE — Addendum Note (Signed)
Addended byIhor Dow on: 07/17/2020 09:48 AM   Modules accepted: Orders

## 2020-07-27 ENCOUNTER — Other Ambulatory Visit: Payer: Self-pay | Admitting: Internal Medicine

## 2020-07-28 NOTE — Telephone Encounter (Signed)
Reached out to Mrs Talcott to assist with MyChart

## 2020-07-29 ENCOUNTER — Ambulatory Visit (INDEPENDENT_AMBULATORY_CARE_PROVIDER_SITE_OTHER): Payer: PPO | Admitting: Internal Medicine

## 2020-07-29 ENCOUNTER — Encounter: Payer: Self-pay | Admitting: Internal Medicine

## 2020-07-29 ENCOUNTER — Other Ambulatory Visit: Payer: Self-pay

## 2020-07-29 VITALS — BP 130/83 | HR 87 | Temp 97.9°F | Resp 18 | Ht 63.0 in | Wt 148.1 lb

## 2020-07-29 DIAGNOSIS — J45909 Unspecified asthma, uncomplicated: Secondary | ICD-10-CM

## 2020-07-29 DIAGNOSIS — J189 Pneumonia, unspecified organism: Secondary | ICD-10-CM | POA: Diagnosis not present

## 2020-07-29 DIAGNOSIS — R053 Chronic cough: Secondary | ICD-10-CM

## 2020-07-29 DIAGNOSIS — K5900 Constipation, unspecified: Secondary | ICD-10-CM | POA: Diagnosis not present

## 2020-07-29 MED ORDER — LUBIPROSTONE 8 MCG PO CAPS: 8.0000 ug | ORAL_CAPSULE | Freq: Two times a day (BID) | ORAL | 2 refills | Status: DC

## 2020-07-29 NOTE — Progress Notes (Signed)
Acute Office Visit  Subjective:    Patient ID: [name], female    DOB: 1961/07/02, 60 y.o.   MRN: [id number]  Chief Complaint  Patient presents with  . Follow-up    Still having some SOB and some chest pains and is doing a lot of coughing at night and has sore throat     HPI [name] is a 60 y.o. female with PMH of HTN, asthma due to seasonal allergies, h/o hemorrhagic stroke in 1999 (unclear etiology), HLD, anxiety with depression, urinary incontinence and GERDwho presented with c/o persistent cough and chest tightness.  She has completed Augmentin for pneumonia. She was prescribed Prednisone for persistent cough, which has helped her up to certain limit. She mentions having sore throat and ear discomfort after coughing. She denies any fever, chills, fatigue or myalgias. She has been taking Singulair and Allegra for her allergies. She denies any new environmental exposure or change in home environment. She denies any hemoptysis.  She denies chest pressure or palpitations.  She has been using Flovent and as needed levalbuterol for her asthma. She has been sweating spells since starting Prednisone. She denies any headache or dizziness.  She also c/o chronic constipation, which is not better even with Colace and Miralax. She had been told of IBS in the past. She has an isolated episode of mucus in stool, but denies blood in stool. Last colonoscopy is not known, but she mentions having colitis at that time, but denies being told of IBD.  Past Medical History:  Diagnosis Date  . Allergy    grass, dust , mold  . Anxiety   . Arthritis   . Asthma due to seasonal allergies 06/15/2020  . Bipolar disorder (HCC)   . Cancer (HCC)    cervical cancer  . Carpal tunnel syndrome    Bilateral  . Chest pain 09/2011   Cardiac cath-normal coronaries  . Constipation   . Depression   . Difficulty urinating 05/31/2013  . Elevated LFTs 12/16/2013  . GERD (gastroesophageal reflux  disease)   . History of kidney stones   . Hyperlipemia   . Hyperlipidemia   . Hypertension    Mild; provoked by stress and anxiety  . IBS (irritable bowel syndrome)   . Intracerebral bleed (HCC)    No aneurysm; followed by Dr. Newell Coral  . Loss of weight 01/06/2015  . Osteoporosis   . Stroke [facility]) 1999   hemorrhagic stroke; weakness of left side    Past Surgical History:  Procedure Laterality Date  . ABDOMINAL HYSTERECTOMY     "cancer cells"  . BRAIN SURGERY  1999   to remove blood clot after stroke   . CARDIAC CATHETERIZATION  2016  . CERVICAL FUSION    . CHOLECYSTECTOMY N/A 10/14/2014   Procedure: LAPAROSCOPIC CHOLECYSTECTOMY WITH INTRAOPERATIVE CHOLANGIOGRAM;  Surgeon: Avel Peace, MD;  Location[facility] OR;  Service: General;  Laterality: N/A;  . CHONDROPLASTY Right 07/13/2017   Procedure: CHONDROPLASTY of patella;  Surgeon: Vickki Hearing, MD;  Location: [facility] ORS;  Service: Orthopedics;  Laterality: Right;  . EUS N/A 08/21/2015   Procedure: ESOPHAGEAL ENDOSCOPIC ULTRASOUND (EUS) RADIAL;  Surgeon: [name], MD;  Location: [facility] ENDOSCOPY;  Service: Endoscopy;  Laterality: N/A;  . KNEE ARTHROSCOPY WITH MEDIAL MENISECTOMY Right 07/13/2017   Procedure: KNEE ARTHROSCOPY WITH PARTIAL MEDIAL MENISECTOMY;  Surgeon: Vickki Hearing, MD;  Location: [facility] ORS;  Service: Orthopedics;  Laterality: Right;  . LEFT HEART CATHETERIZATION WITH CORONARY ANGIOGRAM N/A 09/23/2011  Procedure: LEFT HEART CATHETERIZATION WITH CORONARY ANGIOGRAM;  Surgeon: Vesta Mixer, MD;  Location[facility] CATH LAB;  Service: Cardiovascular;  Laterality: N/A;  . LIPOMA EXCISION Left 11/18/2013   Procedure: EXCISION OF SOFT TISSUE MASS-LEFT THIGH;  Surgeon: Dalia Heading, MD;  Location: [facility] ORS;  Service: General;  Laterality: Left;  . RECTOCELE REPAIR     x2  . RECTOCELE REPAIR N/A 04/04/2017   Procedure: POSTERIOR REPAIR (RECTOCELE);  Surgeon: Tilda Burrow, MD;  Location: [facility] ORS;  Service: Gynecology;  Laterality:  N/A;    Family History  Problem Relation Age of Onset  . Cancer Mother        breast   . Hypertension Mother   . Hyperlipidemia Mother   . Depression Mother   . Anxiety disorder Mother   . COPD Mother   . Arthritis Mother        rheumatoid  . Drug abuse Sister   . Coronary artery disease Paternal Grandfather   . Coronary artery disease Paternal Uncle   . Depression Cousin   . Drug abuse Cousin     Social History   Socioeconomic History  . Marital status: Married    Spouse name: [name]  . Number of children: 0  . Years of education: HS  . Highest education level: Not on file  Occupational History  . Occupation: unemployed    Comment: pending disability  Tobacco Use  . Smoking status: Former Smoker    Packs/day: 1.00    Years: 19.00    Pack years: 19.00    Types: Cigarettes    Quit date: 09/01/1997    Years since quitting: 22.9  . Smokeless tobacco: Never Used  . Tobacco comment: Quit smoking 1999 , previous 20 pack years  Vaping Use  . Vaping Use: Never used  Substance and Sexual Activity  . Alcohol use: Yes    Comment: 1 drink every other week  . Drug use: No  . Sexual activity: Not Currently    Partners: Male    Birth control/protection: Surgical    Comment: hyst   Other Topics Concern  . Not on file  Social History Narrative   Currently unable to work   Lives in [place]   Married   Patient drinks 1 cup of caffeine daily.   Patient is right handed.    [facility]ined the [employer] to get more exercise   Social Determinants of Health   Financial Resource Strain: Not on file  Food Insecurity: Not on file  Transportation Needs: Not on file  Physical Activity: Not on file  Stress: Not on file  Social Connections: Not on file  Intimate Partner Violence: Not on file    Outpatient Medications Prior to Visit  Medication Sig Dispense Refill  . acetaminophen (TYLENOL) 500 MG tablet Take 1,000 mg by mouth every 6 (six) hours as needed for moderate pain.     Marland Kitchen  ALPRAZolam (XANAX) 1 MG tablet Take 1 tablet (1 mg total) by mouth 3 (three) times daily. 90 tablet 2  . amLODipine (NORVASC) 5 MG tablet TAKE ONE TABLET BY MOUTH ONCE DAILY. 90 tablet 0  . amoxicillin-clavulanate (AUGMENTIN) 875-125 MG tablet Take 1 tablet by mouth 2 (two) times daily. 20 tablet 0  . atorvastatin (LIPITOR) 20 MG tablet Take 1 tablet (20 mg total) by mouth daily. 90 tablet 1  . azelastine (ASTELIN) 0.1 % nasal spray 2 sprays each nostril 1-2 times daily as needed 30 mL 5  . azithromycin (ZITHROMAX) 250  MG tablet Take as package instructions 6 tablet 0  . B Complex-C (SUPER B COMPLEX PO) Take 1 tablet by mouth daily.     . cloNIDine (CATAPRES) 0.2 MG tablet Take 0.2 mg by mouth 2 (two) times daily. As needed    . diclofenac sodium (VOLTAREN) 1 % GEL Apply 2 g topically 4 (four) times daily. 100 g 0  . docusate sodium (COLACE) 100 MG capsule Take 100 mg by mouth daily.    . fluconazole (DIFLUCAN) 150 MG tablet Take 1 now and repeat 1 in 3 days 2 tablet 1  . fluticasone (FLOVENT HFA) 44 MCG/ACT inhaler Inhale 2 puffs into the lungs in the morning and at bedtime. 1 each 12  . levalbuterol (XOPENEX HFA) 45 MCG/ACT inhaler Inhale 2 puffs into the lungs every 4 (four) hours as needed for wheezing. 15 g 0  . lisinopril (PRINIVIL,ZESTRIL) 20 MG tablet Take 20 mg by mouth daily.     . methylphenidate (RITALIN) 20 MG tablet Take 1 tablet (20 mg total) by mouth 2 (two) times daily with breakfast and lunch. 60 tablet 0  . methylphenidate (RITALIN) 20 MG tablet Take 1 tablet (20 mg total) by mouth 2 (two) times daily with breakfast and lunch. 60 tablet 0  . methylphenidate (RITALIN) 20 MG tablet Take 1 tablet (20 mg total) by mouth 2 (two) times daily with breakfast and lunch. 60 tablet 0  . montelukast (SINGULAIR) 10 MG tablet Take 1 tablet (10 mg total) by mouth at bedtime. 30 tablet 3  . Multiple Vitamin (MULITIVITAMIN WITH MINERALS) TABS Take 1 tablet by mouth daily.    . nabumetone  (RELAFEN) 500 MG tablet Take 500 mg by mouth every 8 (eight) hours as needed.    Marland Kitchen omeprazole (PRILOSEC) 40 MG capsule Take 1 capsule (40 mg total) by mouth daily. 90 capsule 1  . oxybutynin (DITROPAN-XL) 10 MG 24 hr tablet Take 10 mg by mouth at bedtime.    . Potassium 99 MG TABS Take 99 mg by mouth daily.     . predniSONE (DELTASONE) 10 MG tablet Take 1 tablet (10 mg total) by mouth daily with breakfast for 7 days, THEN 0.5 tablets (5 mg total) daily with breakfast for 8 days. 11 tablet 0  . traZODone (DESYREL) 150 MG tablet Take 1 tablet (150 mg total) by mouth at bedtime. 90 tablet 2  . venlafaxine XR (EFFEXOR XR) 150 MG 24 hr capsule Take 1 capsule (150 mg total) by mouth daily with breakfast. 90 capsule 2   No facility-administered medications prior to visit.    Allergies  Allergen Reactions  . Morphine And Related Hives  . Promethazine Hcl Other (See Comments)    Causes patient to become Hyper    Review of Systems  Constitutional: Negative for chills and fever.  HENT: Positive for sore throat. Negative for congestion, sinus pressure and sinus pain.   Eyes: Negative for pain and discharge.  Respiratory: Positive for cough and shortness of breath.   Cardiovascular: Negative for palpitations.  Gastrointestinal: Positive for constipation. Negative for abdominal pain, diarrhea, nausea and vomiting.  Endocrine: Negative for polydipsia and polyuria.  Genitourinary: Negative for dysuria and hematuria.  Musculoskeletal: Negative for neck pain and neck stiffness.  Skin: Negative for rash.  Neurological: Negative for dizziness and weakness.  Psychiatric/Behavioral: Negative for agitation and behavioral problems.       Objective:    Physical Exam Vitals reviewed.  Constitutional:      General: She is not in  acute distress.    Appearance: She is not diaphoretic.  HENT:     Head: Normocephalic and atraumatic.     Nose: Congestion present.     Mouth/Throat:     Mouth: Mucous  membranes are dry.     Pharynx: No oropharyngeal exudate.  Eyes:     General: No scleral icterus.    Extraocular Movements: Extraocular movements intact.     Pupils: Pupils are equal, round, and reactive to light.  Cardiovascular:     Rate and Rhythm: Normal rate and regular rhythm.     Pulses: Normal pulses.     Heart sounds: Normal heart sounds. No murmur heard.   Pulmonary:     Breath sounds: Normal breath sounds. No wheezing or rales.  Musculoskeletal:     Cervical back: Neck supple. No tenderness.     Right lower leg: No edema.     Left lower leg: No edema.  Skin:    General: Skin is warm.     Findings: No rash.  Neurological:     General: No focal deficit present.     Mental Status: She is alert and oriented to person, place, and time.     Sensory: No sensory deficit.     Motor: No weakness.  Psychiatric:        Mood and Affect: Mood normal.        Behavior: Behavior normal.     BP 130/83 (BP Location: Right Arm, Patient Position: Sitting, Cuff Size: Normal)   Pulse 87   Temp 97.9 F (36.6 C) (Oral)   Resp 18   Ht 5\' 3"  (1.6 m)   Wt 148 lb 1.9 oz (67.2 kg)   SpO2 96%   BMI 26.24 kg/m  Wt Readings from Last 3 Encounters:  07/29/20 148 lb 1.9 oz (67.2 kg)  06/29/20 147 lb (66.7 kg)  06/15/20 148 lb 1.9 oz (67.2 kg)    Health Maintenance Due  Topic Date Due  . Hepatitis C Screening  Never done  . COLONOSCOPY (Pts 45-46yrs Insurance coverage will need to be confirmed)  10/12/2015  . COVID-19 Vaccine (3 - Booster for Pfizer series) 04/19/2020    There are no preventive care reminders to display for this patient.   Lab Results  Component Value Date   TSH 1.438 09/20/2011   Lab Results  Component Value Date   WBC 7.5 07/10/2017   HGB 13.3 07/10/2017   HCT 39.9 07/10/2017   MCV 95.7 07/10/2017   PLT 304 07/10/2017   Lab Results  Component Value Date   NA 138 07/10/2017   K 3.8 07/10/2017   CO2 26 07/10/2017   GLUCOSE 105 (H) 07/10/2017   BUN  14 07/10/2017   CREATININE 0.88 07/10/2017   BILITOT 0.9 06/02/2017   ALKPHOS 80 06/02/2017   AST 19 06/02/2017   ALT 17 06/02/2017   PROT 7.4 06/02/2017   ALBUMIN 5.1 06/02/2017   CALCIUM 9.3 07/10/2017   ANIONGAP 11 07/10/2017   Lab Results  Component Value Date   CHOL 206 (H) 06/02/2017   Lab Results  Component Value Date   HDL 76 06/02/2017   Lab Results  Component Value Date   LDLCALC 102 (H) 06/02/2017   Lab Results  Component Value Date   TRIG 141 06/02/2017   Lab Results  Component Value Date   CHOLHDL 2.3 09/16/2015   Lab Results  Component Value Date   HGBA1C 5.3 06/02/2017       Assessment & Plan:  Problem List Items Addressed This Visit   None   Visit Diagnoses    Chronic cough    -  Primary Asthma related to seasonal allergies Most likely related to postinfectious and/or allergic symptoms Check X-ray of the chest to check for resolution of lung infiltrates Continue Steroids for now Advised to contact Allergy specialist for discussing allergy treatment On Singulair and Allegra On Flovent and Levalalbuterol   Relevant Orders   DG Chest 2 View   Atypical pneumonia     Completed antibiotics Check X-ray   Constipation, unspecified constipation type  On Colace and Miralax with persistent constipation Reports alternative diarrhea in the past and diagnosis of IBS in the past Trial of Amitiza Increase water intake to at least 1.5 liters in a day      Relevant Medications   lubiprostone (AMITIZA) 8 MCG capsule       Meds ordered this encounter  Medications  . lubiprostone (AMITIZA) 8 MCG capsule    Sig: Take 1 capsule (8 mcg total) by mouth 2 (two) times daily with a meal.    Dispense:  60 capsule    Refill:  2     Ketty Bitton Concha Se, MD

## 2020-07-29 NOTE — Patient Instructions (Signed)
Please start taking Amitiza as prescribed.  Please avoid taking Miralax for now to avoid diarrhea.  Please continue to take at least 1.5 liters of fluid in a day.  If you have any episode of mucus or blood in stool, please contact your gastroenterologist.  Please contact your allergy specialist to discuss allergy symptoms.  Please complete the steroid course for now and get X-ray of the chest done at [facility].

## 2020-07-30 NOTE — Telephone Encounter (Signed)
PA initiated through [email or web address] for Levalbuterol.

## 2020-07-31 ENCOUNTER — Telehealth: Payer: Self-pay | Admitting: Allergy & Immunology

## 2020-07-31 NOTE — Telephone Encounter (Signed)
Prior auth submitted via phone for levalbuterol inhaler. We should receive notification via fax next week.

## 2020-07-31 NOTE — Telephone Encounter (Signed)
Pharmacy called about a prior authorization for [employer]. They had a couple of clinical questions. [id number] Option 3. Reference # [id number]

## 2020-08-03 NOTE — Telephone Encounter (Signed)
Approved and sent to the pharmacy as well as scan center.

## 2020-08-03 NOTE — Telephone Encounter (Signed)
Prior auth denied due to diagnosis code. I resubmitting with a different diagnosis.

## 2020-08-04 NOTE — Patient Instructions (Addendum)
Cough-?  Asthma May use Xopenex 2 puffs every 4-6 hours as needed for cough, wheeze, tightness in chest, or shortness of breath. Start Alvesco 80 mcg 1 puff twice a day with spacer to help prevent cough and wheeze. We will give you samples of this medication to use until you come back.  Seasonal and perennial allergic rhinitis(ragweed, indoor and outdoor, cat, dog) Continue Allegra once a day as needed for runny nose or itching Continue Singulair 10 mg once a day Continue Flonase 1 to 2 sprays each nostril once a day as needed for stuffy nose May use sinus rinse as needed for nasal symptoms.  Use this prior to any medicated nasal sprays Continue azelastine nasal spray 1-2 sprays each nostril 1-2 times a day as needed runny nose/drainage  Reflux Continue omeprazole once a day  Please let us know if this treatment plan is not working well for you Schedule a follow-up appointment in 4 weeks

## 2020-08-05 ENCOUNTER — Ambulatory Visit (INDEPENDENT_AMBULATORY_CARE_PROVIDER_SITE_OTHER): Payer: PPO | Admitting: Family

## 2020-08-05 ENCOUNTER — Other Ambulatory Visit: Payer: Self-pay

## 2020-08-05 ENCOUNTER — Encounter: Payer: Self-pay | Admitting: Family

## 2020-08-05 VITALS — BP 120/74 | HR 80 | Temp 98.2°F | Resp 16 | Ht 62.99 in | Wt 151.0 lb

## 2020-08-05 DIAGNOSIS — J302 Other seasonal allergic rhinitis: Secondary | ICD-10-CM

## 2020-08-05 DIAGNOSIS — J3089 Other allergic rhinitis: Secondary | ICD-10-CM

## 2020-08-05 DIAGNOSIS — R059 Cough, unspecified: Secondary | ICD-10-CM | POA: Diagnosis not present

## 2020-08-05 NOTE — Progress Notes (Signed)
[address], [address]  [place] [address] Dept: [phone]  FOLLOW UP NOTE  Patient ID: [name], female    DOB: 1960-11-15  Age: 61 y.o. MRN: [id number] Date of Office Visit: 08/05/2020  Assessment  Chief Complaint: Cough  HPI [name] is a 60 year old female who presents today for follow-up of cough and seasonal and perennial allergic rhinitis.  She was last seen by Dr. Ernst Bowler on May 06, 2020.  Her last office visit she was diagnosed with pneumonia and is going today for a repeat chest x-ray.  Cough is reported as not well controlled with Xopenex as needed.  She reports that her cough is mainly at night.  She also reports wheezing after coughing, tightness in her chest at times, and shortness of breath sometimes.  When she does have the shortness of breath she will use her Xopenex and reports that this does help.  She uses her Xopenex approximately 2 times a week.  Since her last office visit she has not required any trips to  the emergency room or urgent care due to breathing problems.  She has been on 1 round of steroids when she had pneumonia.  Seasonal and perennial allergic rhinitis is reported as moderately controlled with Allegra once a day, Singulair 10 mg once a day, Flonase nasal spray as needed, and saline rinse as needed.  She is currently not using Astelin nasal spray.  She reports dry nostrils in the morning and denies rhinorrhea, nasal congestion, and postnasal drip.  She is currently taking omeprazole in the morning for her reflux.  She reports that she is going to try taking her omeprazole at night rather than in the morning to see if this helps with her nighttime cough.  If her cough is still persistent at her 4-week follow-up I will consider increasing her omeprazole to twice a day.     Drug Allergies:  Allergies  Allergen Reactions  . Morphine And Related Hives  . Promethazine Hcl Other (See Comments)    Causes patient to become  Hyper    Review of Systems: Review of Systems  Constitutional: Negative for chills and fever.  HENT:       Denies postnasal drip, rhinorrhea, and nasal congestion.  Reports dry nostrils in the morning.  Eyes:       Reports itchy eyes and reports that she has lens transplants  Respiratory: Positive for cough, shortness of breath and wheezing.        Reports a cough that occurs at night, shortness of breath at times, and wheezing after coughing.  She also reports that Xopenex helps with her shortness of breath.  Cardiovascular: Negative for chest pain and palpitations.  Gastrointestinal: Negative for abdominal pain.       Reports reflux  Genitourinary: Negative for dysuria.  Skin: Negative for itching and rash.  Neurological: Positive for headaches.       Reports occasional headaches  Endo/Heme/Allergies: Positive for environmental allergies.    Physical Exam: BP 120/74 (BP Location: Right Arm, Patient Position: Sitting, Cuff Size: Normal)   Pulse 80   Temp 98.2 F (36.8 C) (Temporal)   Resp 16   Ht 5' 2.99" (1.6 m)   Wt 151 lb (68.5 kg)   SpO2 94%   BMI 26.76 kg/m    Physical Exam Constitutional:      Appearance: Normal appearance.  HENT:     Head: Normocephalic and atraumatic.     Comments: Pharynx normal, eyes normal, ears  normal, nose normal    Right Ear: Tympanic membrane, ear canal and external ear normal.     Left Ear: Tympanic membrane, ear canal and external ear normal.     Nose: Nose normal.     Mouth/Throat:     Mouth: Mucous membranes are moist.     Pharynx: Oropharynx is clear.  Eyes:     Conjunctiva/sclera: Conjunctivae normal.  Cardiovascular:     Rate and Rhythm: Regular rhythm.     Heart sounds: Normal heart sounds.  Pulmonary:     Effort: Pulmonary effort is normal.     Breath sounds: Normal breath sounds.     Comments: Lungs clear to auscultation Musculoskeletal:     Cervical back: Neck supple.  Skin:    General: Skin is warm.  Neurological:      Mental Status: She is alert and oriented to person, place, and time.  Psychiatric:        Mood and Affect: Mood normal.        Behavior: Behavior normal.        Thought Content: Thought content normal.        Judgment: Judgment normal.     Diagnostics: FVC 2.47 L, FEV1 1.94 L.  Predicted FVC 3.26 L, FEV1 2.54 L.  Spirometry indicates mild restriction.  Status post bronchodilator response shows FVC 2.59 L, FEV1 1.98 L.  Spirometry indicates mild restriction with no significant bronchodilator response.  Assessment and Plan: 1. Cough   2. Seasonal and perennial allergic rhinitis     No orders of the defined types were placed in this encounter.   Patient Instructions  Cough-?  Asthma May use Xopenex 2 puffs every 4-6 hours as needed for cough, wheeze, tightness in chest, or shortness of breath. Start Alvesco 80 mcg 1 puff twice a day with spacer to help prevent cough and wheeze. We will give you samples of this medication to use until you come back.  Seasonal and perennial allergic rhinitis(ragweed, indoor and outdoor, cat, dog) Continue Allegra once a day as needed for runny nose or itching Continue Singulair 10 mg once a day Continue Flonase 1 to 2 sprays each nostril once a day as needed for stuffy nose May use sinus rinse as needed for nasal symptoms.  Use this prior to any medicated nasal sprays Continue azelastine nasal spray 1-2 sprays each nostril 1-2 times a day as needed runny nose/drainage  Reflux Continue omeprazole once a day  Please let us know if this treatment plan is not working well for you Schedule a follow-up appointment in 4 weeks   Return in about 4 weeks (around 09/02/2020).    Thank you for the opportunity to care for this patient.  Please do not hesitate to contact me with questions.  Althea Charon, FNP [facility]

## 2020-08-06 ENCOUNTER — Ambulatory Visit (HOSPITAL_COMMUNITY)
Admission: RE | Admit: 2020-08-06 | Discharge: 2020-08-06 | Disposition: A | Discharge status: Home / Self Care | Payer: PPO | Source: Ambulatory Visit | Attending: Internal Medicine | Admitting: Internal Medicine

## 2020-08-06 DIAGNOSIS — R059 Cough, unspecified: Secondary | ICD-10-CM | POA: Diagnosis not present

## 2020-08-06 DIAGNOSIS — R053 Chronic cough: Secondary | ICD-10-CM | POA: Diagnosis not present

## 2020-08-13 ENCOUNTER — Other Ambulatory Visit: Payer: Self-pay

## 2020-08-13 ENCOUNTER — Encounter: Payer: Self-pay | Admitting: Orthopedic Surgery

## 2020-08-13 ENCOUNTER — Ambulatory Visit (INDEPENDENT_AMBULATORY_CARE_PROVIDER_SITE_OTHER): Payer: PPO | Admitting: Orthopedic Surgery

## 2020-08-13 DIAGNOSIS — M1712 Unilateral primary osteoarthritis, left knee: Secondary | ICD-10-CM | POA: Diagnosis not present

## 2020-08-13 DIAGNOSIS — M7051 Other bursitis of knee, right knee: Secondary | ICD-10-CM | POA: Diagnosis not present

## 2020-08-13 DIAGNOSIS — Z9889 Other specified postprocedural states: Secondary | ICD-10-CM

## 2020-08-13 NOTE — Progress Notes (Signed)
Chief Complaint  Patient presents with  . Injections    Bilateral knees   Ms. [name] comes in for injections in both knees she wants a bursal injection for chronic bursitis right knee and a left knee joint injection for osteoarthritis and chronic pain left knee  Procedure note right knee injection for bursitis   verbal consent was obtained to inject right knee PES BURSA  Timeout was completed to confirm the site of injection  The medications used were 40 mg of Depo-Medrol and 1% lidocaine 3 cc  Anesthesia was provided by ethyl chloride and the skin was prepped with alcohol.  After cleaning the skin with alcohol a 25-gauge needle was used to inject the right knee bursa.  There were no complications and a sterile bandage was applied    Procedure note left knee injection   verbal consent was obtained to inject left knee joint  Timeout was completed to confirm the site of injection  The medications used were 40 mg of Depo-Medrol and 1% lidocaine 3 cc  Anesthesia was provided by ethyl chloride and the skin was prepped with alcohol.  After cleaning the skin with alcohol a 20-gauge needle was used to inject the left knee joint. There were no complications. A sterile bandage was applied.  Encounter Diagnoses  Name Primary?  . Pes anserinus bursitis of right knee Yes  . S/P right knee arthroscopy 07/13/17   . Primary osteoarthritis of left knee

## 2020-08-14 ENCOUNTER — Encounter: Payer: Self-pay | Admitting: Internal Medicine

## 2020-08-29 ENCOUNTER — Other Ambulatory Visit: Payer: Self-pay | Admitting: Internal Medicine

## 2020-08-31 DIAGNOSIS — Z7689 Persons encountering health services in other specified circumstances: Secondary | ICD-10-CM | POA: Diagnosis not present

## 2020-09-01 DIAGNOSIS — H35432 Paving stone degeneration of retina, left eye: Secondary | ICD-10-CM | POA: Diagnosis not present

## 2020-09-01 DIAGNOSIS — Z961 Presence of intraocular lens: Secondary | ICD-10-CM | POA: Diagnosis not present

## 2020-09-01 DIAGNOSIS — H43812 Vitreous degeneration, left eye: Secondary | ICD-10-CM | POA: Diagnosis not present

## 2020-09-01 LAB — CBC WITH DIFFERENTIAL/PLATELET
Basophils Absolute: 0.1 10*3/uL (ref 0.0–0.2)
Basos: 1 %
EOS (ABSOLUTE): 0.1 10*3/uL (ref 0.0–0.4)
Eos: 2 %
Hematocrit: 39.9 % (ref 34.0–46.6)
Hemoglobin: 13.5 g/dL (ref 11.1–15.9)
Immature Grans (Abs): 0 10*3/uL (ref 0.0–0.1)
Immature Granulocytes: 0 %
Lymphocytes Absolute: 1.7 10*3/uL (ref 0.7–3.1)
Lymphs: 30 %
MCH: 32.7 pg (ref 26.6–33.0)
MCHC: 33.8 g/dL (ref 31.5–35.7)
MCV: 97 fL (ref 79–97)
Monocytes Absolute: 0.4 10*3/uL (ref 0.1–0.9)
Monocytes: 7 %
Neutrophils Absolute: 3.4 10*3/uL (ref 1.4–7.0)
Neutrophils: 60 %
Platelets: 289 10*3/uL (ref 150–450)
RBC: 4.13 x10E6/uL (ref 3.77–5.28)
RDW: 13 % (ref 11.7–15.4)
WBC: 5.7 10*3/uL (ref 3.4–10.8)

## 2020-09-01 LAB — LIPID PANEL
Chol/HDL Ratio: 3.2 ratio (ref 0.0–4.4)
Cholesterol, Total: 210 mg/dL — ABNORMAL HIGH (ref 100–199)
HDL: 65 mg/dL (ref >39)
LDL Chol Calc (NIH): 105 mg/dL — ABNORMAL HIGH (ref 0–99)
Triglycerides: 235 mg/dL — ABNORMAL HIGH (ref 0–149)
VLDL Cholesterol Cal: 40 mg/dL (ref 5–40)

## 2020-09-01 LAB — CMP14+EGFR
ALT: 60 IU/L — ABNORMAL HIGH (ref 0–32)
AST: 38 IU/L (ref 0–40)
Albumin/Globulin Ratio: 1.8 (ref 1.2–2.2)
Albumin: 4.4 g/dL (ref 3.8–4.9)
Alkaline Phosphatase: 77 IU/L (ref 44–121)
BUN/Creatinine Ratio: 17 (ref 9–23)
BUN: 18 mg/dL (ref 6–24)
Bilirubin Total: 0.5 mg/dL (ref 0.0–1.2)
CO2: 25 mmol/L (ref 20–29)
Calcium: 9.4 mg/dL (ref 8.7–10.2)
Chloride: 100 mmol/L (ref 96–106)
Creatinine, Ser: 1.07 mg/dL — ABNORMAL HIGH (ref 0.57–1.00)
Globulin, Total: 2.5 g/dL (ref 1.5–4.5)
Glucose: 96 mg/dL (ref 65–99)
Potassium: 4.4 mmol/L (ref 3.5–5.2)
Sodium: 140 mmol/L (ref 134–144)
Total Protein: 6.9 g/dL (ref 6.0–8.5)
eGFR: 61 mL/min/{1.73_m2} (ref >59)

## 2020-09-01 LAB — TSH+FREE T4
Free T4: 1.11 ng/dL (ref 0.82–1.77)
TSH: 1.77 u[IU]/mL (ref 0.450–4.500)

## 2020-09-01 LAB — VITAMIN D 25 HYDROXY (VIT D DEFICIENCY, FRACTURES): Vit D, 25-Hydroxy: 41.6 ng/mL (ref 30.0–100.0)

## 2020-09-07 NOTE — Patient Instructions (Addendum)
Cough-stable May use Xopenex 2 puffs every 4-6 hours as needed for cough, wheeze, tightness in chest, or shortness of breath. Start Alvesco 80 mcg 1 puff twice a day. Make sure and use this every day  Seasonal and perennial allergic rhinitis(ragweed, indoor and outdoor, cat, dog) Continue Allegra once a day as needed for runny nose or itching Continue Singulair 10 mg once a day Continue Flonase 1 to 2 sprays each nostril once a day as needed for stuffy nose May use sinus rinse as needed for nasal symptoms.  Use this prior to any medicated nasal sprays Continue azelastine nasal spray 1-2 sprays each nostril 1-2 times a day as needed runny nose/drainage May use saline gel for nasal dryness  Reflux Continue omeprazole once a day Continue Tums as needed  Please let us know if this treatment plan is not working well for you Schedule a follow-up appointment in 3 months

## 2020-09-08 ENCOUNTER — Other Ambulatory Visit: Payer: Self-pay | Admitting: Orthopedic Surgery

## 2020-09-09 ENCOUNTER — Encounter: Payer: Self-pay | Admitting: Family

## 2020-09-09 ENCOUNTER — Other Ambulatory Visit: Payer: Self-pay

## 2020-09-09 ENCOUNTER — Ambulatory Visit: Payer: PPO | Admitting: Family

## 2020-09-09 VITALS — BP 136/70 | HR 74 | Resp 17

## 2020-09-09 DIAGNOSIS — R0602 Shortness of breath: Secondary | ICD-10-CM

## 2020-09-09 DIAGNOSIS — R059 Cough, unspecified: Secondary | ICD-10-CM

## 2020-09-09 DIAGNOSIS — J3089 Other allergic rhinitis: Secondary | ICD-10-CM

## 2020-09-09 DIAGNOSIS — J302 Other seasonal allergic rhinitis: Secondary | ICD-10-CM

## 2020-09-09 MED ORDER — ALVESCO 80 MCG/ACT IN AERS: 1.0000 | INHALATION_SPRAY | Freq: Two times a day (BID) | RESPIRATORY_TRACT | 1 refills | Status: DC

## 2020-09-09 NOTE — Progress Notes (Signed)
[place], [address] [place] [address] Dept: [phone]  FOLLOW UP NOTE  Patient ID: [name], female    DOB: [birth year]  Age: 60 y.o. MRN: [id number] Date of Office Visit: 09/09/2020  Assessment  Chief Complaint: No chief complaint on file.  HPI [name] is a 60 year old female who presents today for follow-up of cough, seasonal and perennial allergic rhinitis, and reflux.  She was last seen on August 05, 2020 by Althea Charon, FNP.  Cough is reported as doing better.  She reports that her cough has been gone now for 2 weeks and that she found out that her coughing was due to reflux.  When asked if she is still taking the omeprazole once a day she was not sure if she is taking this medication and asked if this is the medication that she takes for her stomach.  She now takes her omeprazole after supper and will take 2 Tums 20 minutes before bed and her coughing has stopped.  When asked if she ever started the Alvesco 80 mcg sample that was given at her last office visit to see if this helps with the cough she reports that she was using it as needed, and then after further questioning she reports that she probably used this medication at first and then stopped.  She had a repeat chest x-ray on August 06, 2020 showing "minimal bibasilar subsegmental atelectasis or scarring."  She now reports that she will have some shortness of breath with exercise at the gym and denies any wheezing, tightness in her chest, or nocturnal awakenings.  Since her last office visit she has not used her Xopenex inhaler or required any systemic steroids or made any trips to the emergency room or urgent care due to breathing problems.  While in the office after doing a breathing test she is reports that she might feel a little tight in her chest or that it could be indigestion.  Seasonal and perennial allergic rhinitis is reported as moderately controlled with Flonase once a day, Singulair  10 mg once a day, and Allegra once a day.  She reports nasal congestion in the morning, occasional sneezing and then she reports that her nasal congestion is really nasal dryness and she has been using Flonase for nasal dryness.  Instructed her that the Flonase was to be used for stuffy nose not nasal dryness.  Reflux is reported as controlled with omeprazole once a day and 2 x 20 minutes before bed.   Drug Allergies:  Allergies  Allergen Reactions  . Morphine And Related Hives  . Promethazine Hcl Other (See Comments)    Causes patient to become Hyper    Review of Systems: Review of Systems  Constitutional: Negative for chills and fever.  HENT:       Reports nasal congestion in the morning that she then reports that it is really not nasal congestion is nasal dryness denies rhinorrhea and postnasal drip.  Also reports occasional sneezing  Eyes:       Reports itchy watery eyes, but has a lens transplant and has special drops to use for this  Respiratory:       Reports some shortness of breath with exercise and denies wheezing and nocturnal awakenings.  While in the office she mentions that maybe she feels a little tight in her chest.  Cardiovascular: Positive for chest pain. Negative for palpitations.  Gastrointestinal: Negative for abdominal pain and heartburn.  Genitourinary: Negative for dysuria.  Skin: Negative for itching and rash.  Neurological: Negative for headaches.  Endo/Heme/Allergies: Positive for environmental allergies.    Physical Exam: BP 136/70 (BP Location: Left Arm, Patient Position: Sitting, Cuff Size: Normal)   Pulse 74   Resp 17   SpO2 95%    Physical Exam Constitutional:      Appearance: Normal appearance.  HENT:     Head: Normocephalic and atraumatic.     Comments: Pharynx normal, eyes normal, ears normal, nose normal    Right Ear: Tympanic membrane, ear canal and external ear normal.     Left Ear: Tympanic membrane, ear canal and external ear normal.      Nose: Nose normal.     Mouth/Throat:     Mouth: Mucous membranes are moist.     Pharynx: Oropharynx is clear.  Eyes:     Conjunctiva/sclera: Conjunctivae normal.  Cardiovascular:     Rate and Rhythm: Regular rhythm.     Heart sounds: Normal heart sounds.  Pulmonary:     Effort: Pulmonary effort is normal.     Breath sounds: Normal breath sounds.     Comments: Lungs clear to auscultation Musculoskeletal:     Cervical back: Neck supple.  Skin:    General: Skin is warm.  Neurological:     Mental Status: She is alert and oriented to person, place, and time.  Psychiatric:        Mood and Affect: Mood normal.        Behavior: Behavior normal.        Thought Content: Thought content normal.        Judgment: Judgment normal.     Diagnostics: FVC 2.18 L, FEV1 1.57 L.  Predicted FVC 3.26 L.,  FEV1 2.54 L.  Spirometry indicates mild restriction.  Status post bronchodilator response shows FVC 2.37 L, FEV1 1.68 L.  Spirometry indicates mild restriction with a 7% change in FEV1  Assessment and Plan: 1. Shortness of breath   2. Cough   3. Seasonal and perennial allergic rhinitis     No orders of the defined types were placed in this encounter.   Patient Instructions  Cough-stable May use Xopenex 2 puffs every 4-6 hours as needed for cough, wheeze, tightness in chest, or shortness of breath. Start Alvesco 80 mcg 1 puff twice a day. Make sure and use this every day  Seasonal and perennial allergic rhinitis(ragweed, indoor and outdoor, cat, dog) Continue Allegra once a day as needed for runny nose or itching Continue Singulair 10 mg once a day Continue Flonase 1 to 2 sprays each nostril once a day as needed for stuffy nose May use sinus rinse as needed for nasal symptoms.  Use this prior to any medicated nasal sprays Continue azelastine nasal spray 1-2 sprays each nostril 1-2 times a day as needed runny nose/drainage May use saline gel for nasal dryness  Reflux Continue  omeprazole once a day Continue Tums as needed  Please let us know if this treatment plan is not working well for you Schedule a follow-up appointment in 3 months   Return in about 3 months (around 12/10/2020), or if symptoms worsen or fail to improve.    Thank you for the opportunity to care for this patient.  Please do not hesitate to contact me with questions.  Althea Charon, FNP [facility]

## 2020-09-10 ENCOUNTER — Encounter: Payer: Self-pay | Admitting: Allergy & Immunology

## 2020-09-10 NOTE — Telephone Encounter (Signed)
Please have her come by the office and pick up some samples to see if this helps

## 2020-09-14 ENCOUNTER — Ambulatory Visit: Payer: No Typology Code available for payment source | Admitting: Internal Medicine

## 2020-09-15 DIAGNOSIS — M549 Dorsalgia, unspecified: Secondary | ICD-10-CM | POA: Diagnosis not present

## 2020-09-15 DIAGNOSIS — Z978 Presence of other specified devices: Secondary | ICD-10-CM | POA: Diagnosis not present

## 2020-09-16 NOTE — Telephone Encounter (Signed)
Thank you :)

## 2020-09-17 NOTE — Telephone Encounter (Signed)
I agree with Chrissy's plan.  Salvatore Marvel, MD [facility]

## 2020-09-17 NOTE — Telephone Encounter (Signed)
I mentioned Flonase because she mentioned in her message that she was having a stuffy nose in the morning. I do not think that she needs an antibiotic at this point.Thank you!

## 2020-09-17 NOTE — Telephone Encounter (Signed)
Please call the patient and find out if she is using her Flonase nasal spray 1-2 sprays each nostril once a day as needed to help with stuffy nose. Is she having any runny nose, post nasal drip, or sinus tenderness fever or chills? When did her symptoms start?  She can try to increase her Alvesco to one puff twice a day and monitor her blood pressure to see if this helps with her chest tightness. She can also use her levalbuterol 2 puffs every 4-6 hours as needed for wheeze, tightness in her chest, or shortness of breath.  Thank you!

## 2020-09-23 DIAGNOSIS — S233XXA Sprain of ligaments of thoracic spine, initial encounter: Secondary | ICD-10-CM | POA: Diagnosis not present

## 2020-09-23 DIAGNOSIS — M47812 Spondylosis without myelopathy or radiculopathy, cervical region: Secondary | ICD-10-CM | POA: Diagnosis not present

## 2020-09-23 DIAGNOSIS — M9903 Segmental and somatic dysfunction of lumbar region: Secondary | ICD-10-CM | POA: Diagnosis not present

## 2020-09-23 DIAGNOSIS — M9901 Segmental and somatic dysfunction of cervical region: Secondary | ICD-10-CM | POA: Diagnosis not present

## 2020-09-23 DIAGNOSIS — M9902 Segmental and somatic dysfunction of thoracic region: Secondary | ICD-10-CM | POA: Diagnosis not present

## 2020-09-23 DIAGNOSIS — M47816 Spondylosis without myelopathy or radiculopathy, lumbar region: Secondary | ICD-10-CM | POA: Diagnosis not present

## 2020-09-28 DIAGNOSIS — M9901 Segmental and somatic dysfunction of cervical region: Secondary | ICD-10-CM | POA: Diagnosis not present

## 2020-09-28 DIAGNOSIS — S233XXA Sprain of ligaments of thoracic spine, initial encounter: Secondary | ICD-10-CM | POA: Diagnosis not present

## 2020-09-28 DIAGNOSIS — M9903 Segmental and somatic dysfunction of lumbar region: Secondary | ICD-10-CM | POA: Diagnosis not present

## 2020-09-28 DIAGNOSIS — M47816 Spondylosis without myelopathy or radiculopathy, lumbar region: Secondary | ICD-10-CM | POA: Diagnosis not present

## 2020-09-28 DIAGNOSIS — M47812 Spondylosis without myelopathy or radiculopathy, cervical region: Secondary | ICD-10-CM | POA: Diagnosis not present

## 2020-09-28 DIAGNOSIS — M9902 Segmental and somatic dysfunction of thoracic region: Secondary | ICD-10-CM | POA: Diagnosis not present

## 2020-09-30 ENCOUNTER — Encounter: Payer: Self-pay | Admitting: Internal Medicine

## 2020-09-30 ENCOUNTER — Other Ambulatory Visit: Payer: Self-pay | Admitting: Internal Medicine

## 2020-09-30 DIAGNOSIS — R2681 Unsteadiness on feet: Secondary | ICD-10-CM

## 2020-10-06 ENCOUNTER — Encounter (HOSPITAL_COMMUNITY): Payer: Self-pay | Admitting: Psychiatry

## 2020-10-06 ENCOUNTER — Encounter: Payer: Self-pay | Admitting: Internal Medicine

## 2020-10-06 ENCOUNTER — Telehealth (INDEPENDENT_AMBULATORY_CARE_PROVIDER_SITE_OTHER): Payer: PPO | Admitting: Internal Medicine

## 2020-10-06 ENCOUNTER — Telehealth (INDEPENDENT_AMBULATORY_CARE_PROVIDER_SITE_OTHER): Payer: PPO | Admitting: Psychiatry

## 2020-10-06 ENCOUNTER — Other Ambulatory Visit: Payer: Self-pay

## 2020-10-06 DIAGNOSIS — F332 Major depressive disorder, recurrent severe without psychotic features: Secondary | ICD-10-CM

## 2020-10-06 DIAGNOSIS — J019 Acute sinusitis, unspecified: Secondary | ICD-10-CM

## 2020-10-06 DIAGNOSIS — R413 Other amnesia: Secondary | ICD-10-CM

## 2020-10-06 MED ORDER — AMPHETAMINE-DEXTROAMPHETAMINE 20 MG PO TABS: 20.0000 mg | ORAL_TABLET | Freq: Two times a day (BID) | ORAL | 0 refills | Status: DC

## 2020-10-06 MED ORDER — AMOXICILLIN-POT CLAVULANATE 875-125 MG PO TABS: 1.0000 | ORAL_TABLET | Freq: Two times a day (BID) | ORAL | 0 refills | Status: DC

## 2020-10-06 MED ORDER — ALPRAZOLAM 1 MG PO TABS: 1.0000 mg | ORAL_TABLET | Freq: Three times a day (TID) | ORAL | 2 refills | Status: DC

## 2020-10-06 MED ORDER — TRAZODONE HCL 100 MG PO TABS: 200.0000 mg | ORAL_TABLET | Freq: Every day | ORAL | 2 refills | Status: DC

## 2020-10-06 MED ORDER — VENLAFAXINE HCL ER 150 MG PO CP24: 150.0000 mg | ORAL_CAPSULE | Freq: Every day | ORAL | 2 refills | Status: DC

## 2020-10-06 NOTE — Progress Notes (Signed)
Virtual Visit via Telephone Note   This visit type was conducted due to national recommendations for restrictions regarding the COVID-19 Pandemic (e.g. social distancing) in an effort to limit this patient's exposure and mitigate transmission in our community.  Due to her co-morbid illnesses, this patient is at least at moderate risk for complications without adequate follow up.  This format is felt to be most appropriate for this patient at this time.  The patient did not have access to video technology/had technical difficulties with video requiring transitioning to audio format only (telephone).  All issues noted in this document were discussed and addressed.  No physical exam could be performed with this format.  Evaluation Performed:  Follow-up visit  Date:  10/06/2020   ID:  [name], [name] 04-15-1961, MRN [id number]  Patient Location: Home Provider Location: Office/Clinic  Participants: Patient Location of Patient: Home Location of Provider: Telehealth Consent was obtain for visit to be over via telehealth. I verified that I am speaking with the correct person using two identifiers.  PCP:  Lindell Spar, MD   Chief Complaint:    History of Present Illness:    [name] is a 60 y.o. female who has a televisit for c/o sinus pressure/pain for last 2 weeks. She has h/o allergies, but she states that this has been different. She has been taking her allergy medications regularly. She also has been having ear fullness. She denies any fever, chills, sore throat.  The patient does not have symptoms concerning for COVID-19 infection (fever, chills, cough, or new shortness of breath).   Past Medical, Surgical, Social History, Allergies, and Medications have been Reviewed.  Past Medical History:  Diagnosis Date  . Allergy    grass, dust , mold  . Anxiety   . Arthritis   . Asthma due to seasonal allergies 06/15/2020  . Bipolar disorder ([place])   . Cancer (HCC)     cervical cancer  . Carpal tunnel syndrome    Bilateral  . Chest pain 09/2011   Cardiac cath-normal coronaries  . Constipation   . Depression   . Difficulty urinating 05/31/2013  . Elevated LFTs 12/16/2013  . GERD (gastroesophageal reflux disease)   . History of kidney stones   . Hyperlipemia   . Hyperlipidemia   . Hypertension    Mild; provoked by stress and anxiety  . IBS (irritable bowel syndrome)   . Intracerebral bleed ([place])    No aneurysm; followed by Dr. Sherwood Gambler  . Loss of weight 01/06/2015  . Osteoporosis   . Stroke [facility]) 1999   hemorrhagic stroke; weakness of left side   Past Surgical History:  Procedure Laterality Date  . ABDOMINAL HYSTERECTOMY     "cancer cells"  . [place]   to remove blood clot after stroke   . CARDIAC CATHETERIZATION  2016  . CERVICAL FUSION    . CHOLECYSTECTOMY N/A 10/14/2014   Procedure: LAPAROSCOPIC CHOLECYSTECTOMY WITH INTRAOPERATIVE CHOLANGIOGRAM;  Surgeon: Jackolyn Confer, MD;  Location: [facility];  Service: General;  Laterality: N/A;  . CHONDROPLASTY Right 07/13/2017   Procedure: CHONDROPLASTY of patella;  Surgeon: Carole Civil, MD;  Location: [facility];  Service: Orthopedics;  Laterality: Right;  . EUS N/A 08/21/2015   Procedure: ESOPHAGEAL ENDOSCOPIC ULTRASOUND (EUS) RADIAL;  Surgeon: Carol Ada, MD;  Location: [facility];  Service: Endoscopy;  Laterality: N/A;  . KNEE ARTHROSCOPY WITH MEDIAL MENISECTOMY Right 07/13/2017   Procedure: KNEE ARTHROSCOPY WITH PARTIAL MEDIAL MENISECTOMY;  Surgeon: Carole Civil, MD;  Location: [facility];  Service: Orthopedics;  Laterality: Right;  . LEFT HEART CATHETERIZATION WITH CORONARY ANGIOGRAM N/A 09/23/2011   Procedure: LEFT HEART CATHETERIZATION WITH CORONARY ANGIOGRAM;  Surgeon: Thayer Headings, MD;  Location: [facility];  Service: Cardiovascular;  Laterality: N/A;  . LIPOMA EXCISION Left 11/18/2013   Procedure: EXCISION OF SOFT TISSUE MASS-LEFT THIGH;  Surgeon: Jamesetta So, MD;   Location: [facility];  Service: General;  Laterality: Left;  . RECTOCELE REPAIR     x2  . RECTOCELE REPAIR N/A 04/04/2017   Procedure: POSTERIOR REPAIR (RECTOCELE);  Surgeon: Jonnie Kind, MD;  Location: [facility];  Service: Gynecology;  Laterality: N/A;     Current Meds  Medication Sig  . acetaminophen (TYLENOL) 500 MG tablet Take 1,000 mg by mouth every 6 (six) hours as needed for moderate pain.   Marland Kitchen ALPRAZolam (XANAX) 1 MG tablet Take 1 tablet (1 mg total) by mouth 3 (three) times daily.  Marland Kitchen amLODipine (NORVASC) 5 MG tablet TAKE ONE TABLET BY MOUTH ONCE DAILY.  Marland Kitchen amoxicillin-clavulanate (AUGMENTIN) 875-125 MG tablet Take 1 tablet by mouth 2 (two) times daily.  Marland Kitchen amphetamine-dextroamphetamine (ADDERALL) 20 MG tablet Take 1 tablet (20 mg total) by mouth 2 (two) times daily.  Marland Kitchen amphetamine-dextroamphetamine (ADDERALL) 20 MG tablet Take 1 tablet (20 mg total) by mouth 2 (two) times daily.  Marland Kitchen amphetamine-dextroamphetamine (ADDERALL) 20 MG tablet Take 1 tablet (20 mg total) by mouth 2 (two) times daily.  Marland Kitchen atorvastatin (LIPITOR) 20 MG tablet Take 1 tablet (20 mg total) by mouth daily.  Marland Kitchen azelastine (ASTELIN) 0.1 % nasal spray 2 sprays each nostril 1-2 times daily as needed  . B Complex-C (SUPER B COMPLEX PO) Take 1 tablet by mouth daily.   . ciclesonide (ALVESCO) 80 MCG/ACT inhaler Inhale 1 puff into the lungs 2 (two) times daily.  . cloNIDine (CATAPRES) 0.2 MG tablet Take 0.2 mg by mouth 2 (two) times daily. As needed  . diclofenac sodium (VOLTAREN) 1 % GEL Apply 2 g topically 4 (four) times daily.  Marland Kitchen docusate sodium (COLACE) 100 MG capsule Take 100 mg by mouth daily.  . fluconazole (DIFLUCAN) 150 MG tablet Take 1 now and repeat 1 in 3 days  . fluticasone (FLOVENT HFA) 44 MCG/ACT inhaler Inhale 2 puffs into the lungs in the morning and at bedtime.  . levalbuterol (XOPENEX HFA) 45 MCG/ACT inhaler Inhale 2 puffs into the lungs every 4 (four) hours as needed for wheezing.  Marland Kitchen lisinopril  (PRINIVIL,ZESTRIL) 20 MG tablet Take 20 mg by mouth daily.   Marland Kitchen lubiprostone (AMITIZA) 8 MCG capsule Take 1 capsule (8 mcg total) by mouth 2 (two) times daily with a meal.  . montelukast (SINGULAIR) 10 MG tablet Take 1 tablet (10 mg total) by mouth at bedtime.  . Multiple Vitamin (MULITIVITAMIN WITH MINERALS) TABS Take 1 tablet by mouth daily.  . nabumetone (RELAFEN) 500 MG tablet TAKE (1) TABLET BY MOUTH EVERY EIGHT HOURS AS NEEDED FOR JOINT PAIN.  Marland Kitchen omeprazole (PRILOSEC) 40 MG capsule Take 1 capsule (40 mg total) by mouth daily.  Marland Kitchen oxybutynin (DITROPAN-XL) 10 MG 24 hr tablet Take 10 mg by mouth at bedtime.  . Potassium 99 MG TABS Take 99 mg by mouth daily.   . traZODone (DESYREL) 100 MG tablet Take 2 tablets (200 mg total) by mouth at bedtime.  Marland Kitchen venlafaxine XR (EFFEXOR XR) 150 MG 24 hr capsule Take 1 capsule (150 mg total) by mouth daily with breakfast.  . [  DISCONTINUED] azithromycin (ZITHROMAX) 250 MG tablet Take as package instructions     Allergies:   Morphine and related and Promethazine hcl   ROS:   Please see the history of present illness.     All other systems reviewed and are negative.   Labs/Other Tests and Data Reviewed:    Recent Labs: 08/31/2020: ALT 60; BUN 18; Creatinine, Ser 1.07; Hemoglobin 13.5; Platelets 289; Potassium 4.4; Sodium 140; TSH 1.770   Recent Lipid Panel Lab Results  Component Value Date/Time   CHOL 210 (H) 08/31/2020 08:39 AM   TRIG 235 (H) 08/31/2020 08:39 AM   HDL 65 08/31/2020 08:39 AM   CHOLHDL 3.2 08/31/2020 08:39 AM   CHOLHDL 2.3 09/16/2015 09:39 AM   LDLCALC 105 (H) 08/31/2020 08:39 AM    Wt Readings from Last 3 Encounters:  08/05/20 151 lb (68.5 kg)  07/29/20 148 lb 1.9 oz (67.2 kg)  06/29/20 147 lb (66.7 kg)      ASSESSMENT & PLAN:    Acute sinusitis Prescribed Augmentin Nasal saline spray Azelastine PRN Tylenol PRN for fever/myalgias Use of humidifier advised  Time:   Today, I have spent 11 minutes reviewing the chart,  including problem list, medications, and with the patient with telehealth technology discussing the above problems.   Medication Adjustments/Labs and Tests Ordered: Current medicines are reviewed at length with the patient today.  Concerns regarding medicines are outlined above.   Tests Ordered: No orders of the defined types were placed in this encounter.   Medication Changes: No orders of the defined types were placed in this encounter.    Note: This dictation was prepared with Dragon dictation along with smaller phrase technology. Similar sounding words can be transcribed inadequately or may not be corrected upon review. Any transcriptional errors that result from this process are unintentional.      Disposition:  Follow up  Signed, Lindell Spar, MD  10/06/2020 11:25 AM     [facility]

## 2020-10-06 NOTE — Patient Instructions (Signed)
Please start taking Augmentin as prescribed.  Continue to use humidifier at nighttime. Okay to use nasal saline spray for nasal congestion. Okay to use mist humidifier or vaporizer to help with sinus symptoms.  Continue to take allergy medications.

## 2020-10-06 NOTE — Progress Notes (Signed)
Virtual Visit via Telephone Note  I connected with [name] on 10/06/20 at  9:00 AM EDT by telephone and verified that I am speaking with the correct person using two identifiers.  Location: Patient: home Provider: home   I discussed the limitations, risks, security and privacy concerns of performing an evaluation and management service by telephone and the availability of in person appointments. I also discussed with the patient that there may be a patient responsible charge related to this service. The patient expressed understanding and agreed to proceed.    I discussed the assessment and treatment plan with the patient. The patient was provided an opportunity to ask questions and all were answered. The patient agreed with the plan and demonstrated an understanding of the instructions.   The patient was advised to call back or seek an in-person evaluation if the symptoms worsen or if the condition fails to improve as anticipated.  I provided 15 minutes of non-face-to-face time during this encounter.   Diamond Spiller, MD  [facility] MD/PA/NP OP Progress Note  10/06/2020 9:20 AM [name]  MRN:  [id number]  Chief Complaint:  Chief Complaint    Depression; Anxiety; Follow-up     HPI: This patient is a 60 year old married white female who lives with her husband in [place]. She has no children. 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  The patient was referred by her primary physician, Dr. [name], for further assessment and treatment of depression anxiety and focus problems.  The patient states that she did well most of her life until she had a stroke in 76 at age 5. She had a cerebral aneurysm that burst and she had to have a hematoma evacuated from the right frontal lobe. She has had resulting difficulties ever since and still has weakness on the left side of her body and poor fine motor skills in her left hand. She is  right handed. She states that she gets older her symptoms worsen.  The patient often feels anxious particular in crowds. She has frequent panic attacks. She takes Xanax 1 mg twice a day but is reluctant to take more. Shortly after she had her stroke she saw psychiatrist in [place] and was placed on the Xanax. Dr. [name] later put her on Effexor and she is now up to 150 mg per day. [name] not sure if it's helping. She has difficulty sleeping but trazodone helps to some degree. She also is significant problems with focus and attention span. Her thoughts ramble. She'll start one thing and go the next. She can't complete tasks. She finds this extremely frustrating. Her mood is labile at times and she'll get angry quickly and for no reason. She denies auditory or visual hallucinations or paranoia. She does not use drugs and very rarely takes a drink. She admits that time she has passive suicidal ideation but would never hurt her self because of her faith  The patient returns for follow-up after 3 months.  She is having difficulty with allergies and congestion and some with back pain.  Other than that her health has been fairly good.  She does not feel like the Ritalin is helping anymore with her focus and energy.  I suggested that we switch to Adderall and she agrees.  She states that her anxiety is well controlled with the Xanax.  Her mood is generally good and she is enjoying things that she is doing with her husband.  Most of the time she is sleeping well but sometimes she is not so I suggested that we increase her trazodone to 200 mg and she agrees.  She denies any thoughts of self-harm or suicidal ideation Visit Diagnosis:    ICD-10-CM   1. Major depressive disorder, recurrent, severe without psychotic features ([place])  F33.2   2. Memory deficit  R41.3     Past Psychiatric History: Past outpatient treatment for depression  Past Medical History:  Past Medical History:  Diagnosis Date  . Allergy     grass, dust , mold  . Anxiety   . Arthritis   . Asthma due to seasonal allergies 06/15/2020  . Bipolar disorder ([place])   . Cancer (HCC)    cervical cancer  . Carpal tunnel syndrome    Bilateral  . Chest pain 09/2011   Cardiac cath-normal coronaries  . Constipation   . Depression   . Difficulty urinating 05/31/2013  . Elevated LFTs 12/16/2013  . GERD (gastroesophageal reflux disease)   . History of kidney stones   . Hyperlipemia   . Hyperlipidemia   . Hypertension    Mild; provoked by stress and anxiety  . IBS (irritable bowel syndrome)   . Intracerebral bleed ([place])    No aneurysm; followed by Dr. Sherwood Gambler  . Loss of weight 01/06/2015  . Osteoporosis   . Stroke [facility]) 1999   hemorrhagic stroke; weakness of left side    Past Surgical History:  Procedure Laterality Date  . ABDOMINAL HYSTERECTOMY     "cancer cells"  . [place]   to remove blood clot after stroke   . CARDIAC CATHETERIZATION  2016  . CERVICAL FUSION    . CHOLECYSTECTOMY N/A 10/14/2014   Procedure: LAPAROSCOPIC CHOLECYSTECTOMY WITH INTRAOPERATIVE CHOLANGIOGRAM;  Surgeon: Jackolyn Confer, MD;  Location: [facility];  Service: General;  Laterality: N/A;  . CHONDROPLASTY Right 07/13/2017   Procedure: CHONDROPLASTY of patella;  Surgeon: Carole Civil, MD;  Location: [facility];  Service: Orthopedics;  Laterality: Right;  . EUS N/A 08/21/2015   Procedure: ESOPHAGEAL ENDOSCOPIC ULTRASOUND (EUS) RADIAL;  Surgeon: Carol Ada, MD;  Location: [facility];  Service: Endoscopy;  Laterality: N/A;  . KNEE ARTHROSCOPY WITH MEDIAL MENISECTOMY Right 07/13/2017   Procedure: KNEE ARTHROSCOPY WITH PARTIAL MEDIAL MENISECTOMY;  Surgeon: Carole Civil, MD;  Location: [facility];  Service: Orthopedics;  Laterality: Right;  . LEFT HEART CATHETERIZATION WITH CORONARY ANGIOGRAM N/A 09/23/2011   Procedure: LEFT HEART CATHETERIZATION WITH CORONARY ANGIOGRAM;  Surgeon: Thayer Headings, MD;  Location: [facility];  Service: Cardiovascular;   Laterality: N/A;  . LIPOMA EXCISION Left 11/18/2013   Procedure: EXCISION OF SOFT TISSUE MASS-LEFT THIGH;  Surgeon: Jamesetta So, MD;  Location: [facility];  Service: General;  Laterality: Left;  . RECTOCELE REPAIR     x2  . RECTOCELE REPAIR N/A 04/04/2017   Procedure: POSTERIOR REPAIR (RECTOCELE);  Surgeon: Jonnie Kind, MD;  Location: [facility];  Service: Gynecology;  Laterality: N/A;    Family Psychiatric History: see below  Family History:  Family History  Problem Relation Age of Onset  . Cancer Mother        breast   . Hypertension Mother   . Hyperlipidemia Mother   . Depression Mother   . Anxiety disorder Mother   . COPD Mother   . Arthritis Mother        rheumatoid  . Drug abuse Sister   . Coronary artery disease Paternal Grandfather   .  Coronary artery disease Paternal Uncle   . Depression Cousin   . Drug abuse Cousin     Social History:  Social History   Socioeconomic History  . Marital status: Married    Spouse name: Sonia Side  . Number of children: 0  . Years of education: HS  . Highest education level: Not on file  Occupational History  . Occupation: unemployed    Comment: pending disability  Tobacco Use  . Smoking status: Former Smoker    Packs/day: 1.00    Years: 19.00    Pack years: 19.00    Types: Cigarettes    Quit date: 09/01/1997    Years since quitting: 23.1  . Smokeless tobacco: Never Used  . Tobacco comment: Quit smoking 1999 , previous 20 pack years  Vaping Use  . Vaping Use: Never used  Substance and Sexual Activity  . Alcohol use: Yes    Comment: 1 drink every other week  . Drug use: No  . Sexual activity: Not Currently    Partners: Male    Birth control/protection: Surgical    Comment: hyst   Other Topics Concern  . Not on file  Social History Narrative   Currently unable to work   Lives in [place]   Married   Patient drinks 1 cup of caffeine daily.   Patient is right handed.    Joined the [employer] to get more exercise   Social  Determinants of Health   Financial Resource Strain: Not on file  Food Insecurity: Not on file  Transportation Needs: Not on file  Physical Activity: Not on file  Stress: Not on file  Social Connections: Not on file    Allergies:  Allergies  Allergen Reactions  . Morphine And Related Hives  . Promethazine Hcl Other (See Comments)    Causes patient to become Hyper    Metabolic Disorder Labs: Lab Results  Component Value Date   HGBA1C 5.3 06/02/2017   MPG 117 (H) 01/06/2015   MPG 117 (H) 02/17/2014   No results found for: PROLACTIN Lab Results  Component Value Date   CHOL 210 (H) 08/31/2020   TRIG 235 (H) 08/31/2020   HDL 65 08/31/2020   CHOLHDL 3.2 08/31/2020   VLDL 26 09/16/2015   LDLCALC 105 (H) 08/31/2020   LDLCALC 102 (H) 06/02/2017   Lab Results  Component Value Date   TSH 1.770 08/31/2020   TSH 1.438 09/20/2011    Therapeutic Level Labs: No results found for: LITHIUM No results found for: VALPROATE No components found for:  CBMZ  Current Medications: Current Outpatient Medications  Medication Sig Dispense Refill  . acetaminophen (TYLENOL) 500 MG tablet Take 1,000 mg by mouth every 6 (six) hours as needed for moderate pain.     Marland Kitchen ALPRAZolam (XANAX) 1 MG tablet Take 1 tablet (1 mg total) by mouth 3 (three) times daily. 90 tablet 2  . amLODipine (NORVASC) 5 MG tablet TAKE ONE TABLET BY MOUTH ONCE DAILY. 90 tablet 0  . amoxicillin-clavulanate (AUGMENTIN) 875-125 MG tablet Take 1 tablet by mouth 2 (two) times daily. 20 tablet 0  . amphetamine-dextroamphetamine (ADDERALL) 20 MG tablet Take 1 tablet (20 mg total) by mouth 2 (two) times daily. 60 tablet 0  . amphetamine-dextroamphetamine (ADDERALL) 20 MG tablet Take 1 tablet (20 mg total) by mouth 2 (two) times daily. 60 tablet 0  . amphetamine-dextroamphetamine (ADDERALL) 20 MG tablet Take 1 tablet (20 mg total) by mouth 2 (two) times daily. 60 tablet 0  . atorvastatin (LIPITOR)  20 MG tablet Take 1 tablet (20 mg  total) by mouth daily. 90 tablet 1  . azelastine (ASTELIN) 0.1 % nasal spray 2 sprays each nostril 1-2 times daily as needed 30 mL 5  . azithromycin (ZITHROMAX) 250 MG tablet Take as package instructions 6 tablet 0  . B Complex-C (SUPER B COMPLEX PO) Take 1 tablet by mouth daily.     . ciclesonide (ALVESCO) 80 MCG/ACT inhaler Inhale 1 puff into the lungs 2 (two) times daily. 1 each 1  . cloNIDine (CATAPRES) 0.2 MG tablet Take 0.2 mg by mouth 2 (two) times daily. As needed    . diclofenac sodium (VOLTAREN) 1 % GEL Apply 2 g topically 4 (four) times daily. 100 g 0  . docusate sodium (COLACE) 100 MG capsule Take 100 mg by mouth daily.    . fluconazole (DIFLUCAN) 150 MG tablet Take 1 now and repeat 1 in 3 days 2 tablet 1  . fluticasone (FLOVENT HFA) 44 MCG/ACT inhaler Inhale 2 puffs into the lungs in the morning and at bedtime. 1 each 12  . levalbuterol (XOPENEX HFA) 45 MCG/ACT inhaler Inhale 2 puffs into the lungs every 4 (four) hours as needed for wheezing. 15 g 0  . lisinopril (PRINIVIL,ZESTRIL) 20 MG tablet Take 20 mg by mouth daily.     Marland Kitchen lubiprostone (AMITIZA) 8 MCG capsule Take 1 capsule (8 mcg total) by mouth 2 (two) times daily with a meal. 60 capsule 2  . montelukast (SINGULAIR) 10 MG tablet Take 1 tablet (10 mg total) by mouth at bedtime. 30 tablet 3  . Multiple Vitamin (MULITIVITAMIN WITH MINERALS) TABS Take 1 tablet by mouth daily.    . nabumetone (RELAFEN) 500 MG tablet TAKE (1) TABLET BY MOUTH EVERY EIGHT HOURS AS NEEDED FOR JOINT PAIN. 60 tablet 0  . omeprazole (PRILOSEC) 40 MG capsule Take 1 capsule (40 mg total) by mouth daily. 90 capsule 1  . oxybutynin (DITROPAN-XL) 10 MG 24 hr tablet Take 10 mg by mouth at bedtime.    . Potassium 99 MG TABS Take 99 mg by mouth daily.     . traZODone (DESYREL) 100 MG tablet Take 2 tablets (200 mg total) by mouth at bedtime. 60 tablet 2  . venlafaxine XR (EFFEXOR XR) 150 MG 24 hr capsule Take 1 capsule (150 mg total) by mouth daily with breakfast.  90 capsule 2   No current facility-administered medications for this visit.     Musculoskeletal: Strength & Muscle Tone: within normal limits Gait & Station: normal Patient leans: N/A  Psychiatric Specialty Exam: Review of Systems  HENT: Positive for congestion and sinus pressure.   Musculoskeletal: Positive for back pain.  Psychiatric/Behavioral: Positive for decreased concentration and sleep disturbance.  All other systems reviewed and are negative.   There were no vitals taken for this visit.There is no height or weight on file to calculate BMI.  General Appearance: NA  Eye Contact:  NA  Speech:  Clear and Coherent  Volume:  Normal  Mood:  Euthymic  Affect:  NA  Thought Process:  Goal Directed  Orientation:  Full (Time, Place, and Person)  Thought Content: WDL   Suicidal Thoughts:  No  Homicidal Thoughts:  No  Memory:  Immediate;   Good Recent;   Good Remote;   Fair  Judgement:  Good  Insight:  Fair  Psychomotor Activity:  Decreased  Concentration:  Concentration: Fair and Attention Span: Fair  Recall:  [place] of Knowledge: Good  Language: Good  Akathisia:  No  Handed:  Right  AIMS (if indicated): not done  Assets:  Communication Skills Desire for Improvement Resilience Social Support Talents/Skills  ADL's:  Intact  Cognition: WNL  Sleep:  Poor   Screenings: MDI   Flowsheet Row Office Visit from 01/22/2016 in [facility]  Total Score (max 50) 34    Mini-Mental   Flowsheet Row Office Visit from 02/13/2015 in [facility]  Total Score (max 30 points ) 26    PHQ2-9   Flowsheet Row Video Visit from 10/06/2020 in [facility] Office Visit from 07/29/2020 in [facility] Video Visit from 07/09/2020 in [facility] Office Visit from 06/29/2020 in [facility] Video Visit from 06/19/2020 in [facility]  PHQ-2 Total  Score 0 0 0 0 0  PHQ-9 Total Score -- -- -- 1 --    SBQ-R   [place] Office Visit from 01/22/2016 in [facility]  SBQ-R Total Score 14.1    Flowsheet Row Video Visit from 10/06/2020 in [facility] No Risk       Assessment and Plan: This patient is a 60 year old female with a history of depression anxiety and problems with focus and energy.  She does not feel the methylphenidate is helping so we will switch to Adderall 20 mg twice daily for focus and alertness.  We will also increase trazodone to 200 mg for sleep.  She will continue Effexor XR 150 mg daily for depression and Xanax 1 mg 3 times daily for anxiety.  She will return to see me in 3 months   Diamond Spiller, MD 10/06/2020, 9:20 AM

## 2020-10-08 DIAGNOSIS — M9901 Segmental and somatic dysfunction of cervical region: Secondary | ICD-10-CM | POA: Diagnosis not present

## 2020-10-08 DIAGNOSIS — M47812 Spondylosis without myelopathy or radiculopathy, cervical region: Secondary | ICD-10-CM | POA: Diagnosis not present

## 2020-10-08 DIAGNOSIS — M47816 Spondylosis without myelopathy or radiculopathy, lumbar region: Secondary | ICD-10-CM | POA: Diagnosis not present

## 2020-10-08 DIAGNOSIS — M9903 Segmental and somatic dysfunction of lumbar region: Secondary | ICD-10-CM | POA: Diagnosis not present

## 2020-10-08 DIAGNOSIS — M9902 Segmental and somatic dysfunction of thoracic region: Secondary | ICD-10-CM | POA: Diagnosis not present

## 2020-10-08 DIAGNOSIS — S233XXA Sprain of ligaments of thoracic spine, initial encounter: Secondary | ICD-10-CM | POA: Diagnosis not present

## 2020-10-15 ENCOUNTER — Encounter (HOSPITAL_COMMUNITY): Payer: Self-pay

## 2020-10-16 ENCOUNTER — Other Ambulatory Visit: Payer: Self-pay | Admitting: Nurse Practitioner

## 2020-10-16 ENCOUNTER — Other Ambulatory Visit: Payer: Self-pay | Admitting: Internal Medicine

## 2020-10-19 DIAGNOSIS — R2681 Unsteadiness on feet: Secondary | ICD-10-CM | POA: Diagnosis not present

## 2020-10-19 DIAGNOSIS — M545 Low back pain, unspecified: Secondary | ICD-10-CM | POA: Diagnosis not present

## 2020-10-27 ENCOUNTER — Encounter: Payer: Self-pay | Admitting: Internal Medicine

## 2020-10-27 ENCOUNTER — Other Ambulatory Visit: Payer: Self-pay

## 2020-10-27 ENCOUNTER — Ambulatory Visit (INDEPENDENT_AMBULATORY_CARE_PROVIDER_SITE_OTHER): Payer: PPO | Admitting: Internal Medicine

## 2020-10-27 ENCOUNTER — Encounter (INDEPENDENT_AMBULATORY_CARE_PROVIDER_SITE_OTHER): Payer: Self-pay | Admitting: *Deleted

## 2020-10-27 VITALS — BP 121/68 | HR 75 | Temp 98.2°F | Resp 18 | Ht 63.0 in | Wt 149.1 lb

## 2020-10-27 DIAGNOSIS — Z1159 Encounter for screening for other viral diseases: Secondary | ICD-10-CM | POA: Diagnosis not present

## 2020-10-27 DIAGNOSIS — K219 Gastro-esophageal reflux disease without esophagitis: Secondary | ICD-10-CM | POA: Diagnosis not present

## 2020-10-27 DIAGNOSIS — R32 Unspecified urinary incontinence: Secondary | ICD-10-CM | POA: Diagnosis not present

## 2020-10-27 DIAGNOSIS — K581 Irritable bowel syndrome with constipation: Secondary | ICD-10-CM | POA: Diagnosis not present

## 2020-10-27 DIAGNOSIS — J45909 Unspecified asthma, uncomplicated: Secondary | ICD-10-CM | POA: Diagnosis not present

## 2020-10-27 DIAGNOSIS — E782 Mixed hyperlipidemia: Secondary | ICD-10-CM | POA: Diagnosis not present

## 2020-10-27 DIAGNOSIS — I10 Essential (primary) hypertension: Secondary | ICD-10-CM

## 2020-10-27 DIAGNOSIS — Z1211 Encounter for screening for malignant neoplasm of colon: Secondary | ICD-10-CM | POA: Diagnosis not present

## 2020-10-27 MED ORDER — OXYBUTYNIN CHLORIDE ER 10 MG PO TB24
10.0000 mg | ORAL_TABLET | Freq: Every day | ORAL | 1 refills | Status: DC
Start: 2020-10-27 — End: 2021-04-01

## 2020-10-27 NOTE — Assessment & Plan Note (Signed)
BP Readings from Last 1 Encounters:  10/27/20 121/68   Well-controlled with Amlodipine 5 mg QD and Lisinopril 20 mg QD Counseled for compliance with the medications Advised DASH diet and moderate exercise/walking, at least 150 mins/week

## 2020-10-27 NOTE — Assessment & Plan Note (Signed)
On Omeprazole

## 2020-10-27 NOTE — Assessment & Plan Note (Signed)
On Oxybutynin

## 2020-10-27 NOTE — Assessment & Plan Note (Signed)
Lipid profile reviewed Continue Atorvastatin

## 2020-10-27 NOTE — Progress Notes (Signed)
Established Patient Office Visit  Subjective:  Patient ID: [name], female    DOB: Jun 11, 1961  Age: 60 y.o. MRN: [id number]  CC:  Chief Complaint  Patient presents with  . Follow-up    3 month follow up     HPI [name] is a 60 y.o.femalewith PMH of HTN, asthma due to seasonal allergies, h/o hemorrhagic stroke in 1999 (unclear etiology), HLD, anxiety with depression, urinary incontinence and GERDwho presents for follow up of her chronic medical conditions.  She has been doing well overall.  HTN: BP is well-controlled. Takes medications regularly. Patient denies headache, dizziness, chest pain, dyspnea or palpitations. Has not required PRN Clonidine that was prescribed by previous PCP.  Asthma: Well-controlled now with weather change now. On Flovent and Levalbuterol inhaler. Takes Allegra and Singulair.  She requests refill for Oxybutynin for urinary incontinence.  GERD: Well-controlled with Omeprazole.  She has not had colonoscopy since 2007.  Past Medical History:  Diagnosis Date  . Allergy    grass, dust , mold  . Anxiety   . Arthritis   . Asthma due to seasonal allergies 06/15/2020  . Bipolar disorder ([place])   . Cancer (HCC)    cervical cancer  . Carpal tunnel syndrome    Bilateral  . Chest pain 09/2011   Cardiac cath-normal coronaries  . Constipation   . Depression   . Difficulty urinating 05/31/2013  . Elevated LFTs 12/16/2013  . GERD (gastroesophageal reflux disease)   . History of kidney stones   . Hyperlipemia   . Hyperlipidemia   . Hypertension    Mild; provoked by stress and anxiety  . IBS (irritable bowel syndrome)   . Intracerebral bleed ([place])    No aneurysm; followed by Dr. Sherwood Gambler  . Loss of weight 01/06/2015  . Osteoporosis   . Stroke [facility]) 1999   hemorrhagic stroke; weakness of left Santiago    Past Surgical History:  Procedure Laterality Date  . ABDOMINAL HYSTERECTOMY     "cancer cells"  . [place]   to remove  blood clot after stroke   . CARDIAC CATHETERIZATION  2016  . CERVICAL FUSION    . CHOLECYSTECTOMY N/A 10/14/2014   Procedure: LAPAROSCOPIC CHOLECYSTECTOMY WITH INTRAOPERATIVE CHOLANGIOGRAM;  Surgeon: Jackolyn Confer, MD;  Location: [facility];  Service: General;  Laterality: N/A;  . CHONDROPLASTY Right 07/13/2017   Procedure: CHONDROPLASTY of patella;  Surgeon: Carole Civil, MD;  Location: [facility];  Service: Orthopedics;  Laterality: Right;  . EUS N/A 08/21/2015   Procedure: ESOPHAGEAL ENDOSCOPIC ULTRASOUND (EUS) RADIAL;  Surgeon: Carol Ada, MD;  Location: [facility];  Service: Endoscopy;  Laterality: N/A;  . KNEE ARTHROSCOPY WITH MEDIAL MENISECTOMY Right 07/13/2017   Procedure: KNEE ARTHROSCOPY WITH PARTIAL MEDIAL MENISECTOMY;  Surgeon: Carole Civil, MD;  Location: [facility];  Service: Orthopedics;  Laterality: Right;  . LEFT HEART CATHETERIZATION WITH CORONARY ANGIOGRAM N/A 09/23/2011   Procedure: LEFT HEART CATHETERIZATION WITH CORONARY ANGIOGRAM;  Surgeon: Thayer Headings, MD;  Location: [facility];  Service: Cardiovascular;  Laterality: N/A;  . LIPOMA EXCISION Left 11/18/2013   Procedure: EXCISION OF SOFT TISSUE MASS-LEFT THIGH;  Surgeon: Jamesetta So, MD;  Location: [facility];  Service: General;  Laterality: Left;  . RECTOCELE REPAIR     x2  . RECTOCELE REPAIR N/A 04/04/2017   Procedure: POSTERIOR REPAIR (RECTOCELE);  Surgeon: Jonnie Kind, MD;  Location: [facility];  Service: Gynecology;  Laterality: N/A;    Family History  Problem Relation Age of Onset  . Cancer Mother        breast   . Hypertension Mother   . Hyperlipidemia Mother   . Depression Mother   . Anxiety disorder Mother   . COPD Mother   . Arthritis Mother        rheumatoid  . Drug abuse Sister   . Coronary artery disease Paternal Grandfather   . Coronary artery disease Paternal Uncle   . Depression Cousin   . Drug abuse Cousin     Social History   Socioeconomic History  . Marital status: Married     Spouse name: [name]  . Number of children: 0  . Years of education: HS  . Highest education level: Not on file  Occupational History  . Occupation: unemployed    Comment: pending disability  Tobacco Use  . Smoking status: Former Smoker    Packs/day: 1.00    Years: 19.00    Pack years: 19.00    Types: Cigarettes    Quit date: 09/01/1997    Years since quitting: 23.1  . Smokeless tobacco: Never Used  . Tobacco comment: Quit smoking 1999 , previous 20 pack years  Vaping Use  . Vaping Use: Never used  Substance and Sexual Activity  . Alcohol use: Yes    Comment: 1 drink every other week  . Drug use: No  . Sexual activity: Not Currently    Partners: Male    Birth control/protection: Surgical    Comment: hyst   Other Topics Concern  . Not on file  Social History Narrative   Currently unable to work   Lives in [place]   Married   Patient drinks 1 cup of caffeine daily.   Patient is right handed.    [facility]ined [employer] to get more exercise   Social Determinants of Health   Financial Resource Strain: Not on file  Food Insecurity: Not on file  Transportation Needs: Not on file  Physical Activity: Not on file  Stress: Not on file  Social Connections: Not on file  Intimate Partner Violence: Not on file    Outpatient Medications Prior to Visit  Medication Sig Dispense Refill  . acetaminophen (TYLENOL) 500 MG tablet Take 1,000 mg by mouth every 6 (six) hours as needed for moderate pain.     Marland Kitchen ALPRAZolam (XANAX) 1 MG tablet Take 1 tablet (1 mg total) by mouth 3 (three) times daily. 90 tablet 2  . amLODipine (NORVASC) 5 MG tablet TAKE ONE TABLET BY MOUTH ONCE DAILY. 90 tablet 0  . amphetamine-dextroamphetamine (ADDERALL) 20 MG tablet Take 1 tablet (20 mg total) by mouth 2 (two) times daily. 60 tablet 0  . atorvastatin (LIPITOR) 20 MG tablet Take 1 tablet (20 mg total) by mouth daily. 90 tablet 1  . azelastine (ASTELIN) 0.1 % nasal spray 2 sprays each nostril 1-2 times daily as needed  30 mL 5  . B Complex-C (SUPER B COMPLEX PO) Take 1 tablet by mouth daily.     . ciclesonide (ALVESCO) 80 MCG/ACT inhaler Inhale 1 puff into the lungs 2 (two) times daily. 1 each 1  . diclofenac sodium (VOLTAREN) 1 % GEL Apply 2 g topically 4 (four) times daily. 100 g 0  . docusate sodium (COLACE) 100 MG capsule Take 100 mg by mouth daily.    . fluticasone (FLOVENT HFA) 44 MCG/ACT inhaler Inhale 2 puffs into the lungs in the morning and at bedtime. 1 each 12  . levalbuterol (XOPENEX HFA)  45 MCG/ACT inhaler Inhale 2 puffs into the lungs every 4 (four) hours as needed for wheezing. 15 g 0  . lisinopril (PRINIVIL,ZESTRIL) 20 MG tablet Take 20 mg by mouth daily.     Marland Kitchen lubiprostone (AMITIZA) 8 MCG capsule Take 1 capsule (8 mcg total) by mouth 2 (two) times daily with a meal. 60 capsule 2  . montelukast (SINGULAIR) 10 MG tablet Take 1 tablet (10 mg total) by mouth at bedtime. 30 tablet 3  . Multiple Vitamin (MULITIVITAMIN WITH MINERALS) TABS Take 1 tablet by mouth daily.    . nabumetone (RELAFEN) 500 MG tablet TAKE (1) TABLET BY MOUTH EVERY EIGHT HOURS AS NEEDED FOR JOINT PAIN. 60 tablet 0  . omeprazole (PRILOSEC) 40 MG capsule Take 1 capsule (40 mg total) by mouth daily. 90 capsule 1  . Potassium 99 MG TABS Take 99 mg by mouth daily.     . traZODone (DESYREL) 100 MG tablet Take 2 tablets (200 mg total) by mouth at bedtime. 60 tablet 2  . venlafaxine XR (EFFEXOR XR) 150 MG 24 hr capsule Take 1 capsule (150 mg total) by mouth daily with breakfast. 90 capsule 2  . cloNIDine (CATAPRES) 0.2 MG tablet Take 0.2 mg by mouth 2 (two) times daily. As needed    . fluconazole (DIFLUCAN) 150 MG tablet Take 1 now and repeat 1 in 3 days 2 tablet 1  . oxybutynin (DITROPAN-XL) 10 MG 24 hr tablet Take 10 mg by mouth at bedtime.    Marland Kitchen amoxicillin-clavulanate (AUGMENTIN) 875-125 MG tablet Take 1 tablet by mouth 2 (two) times daily. (Patient not taking: Reported on 10/27/2020) 20 tablet 0  . amphetamine-dextroamphetamine  (ADDERALL) 20 MG tablet Take 1 tablet (20 mg total) by mouth 2 (two) times daily. (Patient not taking: Reported on 10/27/2020) 60 tablet 0  . amphetamine-dextroamphetamine (ADDERALL) 20 MG tablet Take 1 tablet (20 mg total) by mouth 2 (two) times daily. (Patient not taking: Reported on 10/27/2020) 60 tablet 0   No facility-administered medications prior to visit.    Allergies  Allergen Reactions  . Morphine And Related Hives  . Promethazine Hcl Other (See Comments)    Causes patient to become Hyper    ROS Review of Systems  Constitutional: Negative for chills and fever.  HENT: Negative for congestion, sinus pressure, sinus pain and sore throat.   Eyes: Negative for pain and discharge.  Respiratory: Negative for cough and shortness of breath.   Cardiovascular: Negative for chest pain and palpitations.  Gastrointestinal: Positive for constipation. Negative for abdominal pain, diarrhea, nausea and vomiting.  Endocrine: Negative for polydipsia and polyuria.  Genitourinary: Negative for dysuria and hematuria.  Musculoskeletal: Negative for neck pain and neck stiffness.  Skin: Negative for rash.  Allergic/Immunologic: Positive for environmental allergies.  Neurological: Negative for dizziness and weakness.  Psychiatric/Behavioral: Negative for agitation and behavioral problems.      Objective:    Physical Exam Vitals reviewed.  Constitutional:      General: She is not in acute distress.    Appearance: She is not diaphoretic.  HENT:     Head: Normocephalic and atraumatic.     Nose: Nose normal. No congestion.     Mouth/Throat:     Mouth: Mucous membranes are moist.     Pharynx: No posterior oropharyngeal erythema.  Eyes:     General: No scleral icterus.    Extraocular Movements: Extraocular movements intact.  Cardiovascular:     Rate and Rhythm: Normal rate and regular rhythm.  Pulses: Normal pulses.     Heart sounds: Normal heart sounds. No murmur heard.   Pulmonary:      Breath sounds: Normal breath sounds. No wheezing or rales.  Musculoskeletal:     Cervical back: Neck supple. No tenderness.     Right lower leg: No edema.     Left lower leg: No edema.  Skin:    General: Skin is warm.     Findings: No rash.  Neurological:     General: No focal deficit present.     Mental Status: She is alert and oriented to person, place, and time.  Psychiatric:        Mood and Affect: Mood normal.        Behavior: Behavior normal.     BP 121/68 (BP Location: Right Arm, Patient Position: Sitting, Cuff Size: Normal)   Pulse 75   Temp 98.2 F (36.8 C) (Oral)   Resp 18   Ht _0  (1.6 m)   Wt 149 lb 1.9 oz (67.6 kg)   SpO2 96%   BMI 26.42 kg/m  Wt Readings from Last 3 Encounters:  10/27/20 149 lb 1.9 oz (67.6 kg)  08/05/20 151 lb (68.5 kg)  07/29/20 148 lb 1.9 oz (67.2 kg)     Health Maintenance Due  Topic Date Due  . Hepatitis C Screening  Never done  . COLONOSCOPY (Pts 45-21yr Insurance coverage will need to be confirmed)  10/12/2015    There are no preventive care reminders to display for this patient.  Lab Results  Component Value Date   TSH 1.770 08/31/2020   Lab Results  Component Value Date   WBC 5.7 08/31/2020   HGB 13.5 08/31/2020   HCT 39.9 08/31/2020   MCV 97 08/31/2020   PLT 289 08/31/2020   Lab Results  Component Value Date   NA 140 08/31/2020   K 4.4 08/31/2020   CO2 25 08/31/2020   GLUCOSE 96 08/31/2020   BUN 18 08/31/2020   CREATININE 1.07 (H) 08/31/2020   BILITOT 0.5 08/31/2020   ALKPHOS 77 08/31/2020   AST 38 08/31/2020   ALT 60 (H) 08/31/2020   PROT 6.9 08/31/2020   ALBUMIN 4.4 08/31/2020   CALCIUM 9.4 08/31/2020   ANIONGAP 11 07/10/2017   EGFR 61 08/31/2020   Lab Results  Component Value Date   CHOL 210 (H) 08/31/2020   Lab Results  Component Value Date   HDL 65 08/31/2020   Lab Results  Component Value Date   LDLCALC 105 (H) 08/31/2020   Lab Results  Component Value Date   TRIG 235 (H)  08/31/2020   Lab Results  Component Value Date   CHOLHDL 3.2 08/31/2020   Lab Results  Component Value Date   HGBA1C 5.3 06/02/2017      Assessment & Plan:   Problem List Items Addressed This Visit      Cardiovascular and Mediastinum   Hypertension - Primary    BP Readings from Last 1 Encounters:  10/27/20 121/68   Well-controlled with Amlodipine 5 mg QD and Lisinopril 20 mg QD Counseled for compliance with the medications Advised DASH diet and moderate exercise/walking, at least 150 mins/week       Relevant Orders   CBC with Differential/Platelet   CMP14+EGFR   TSH     Respiratory   Asthma due to seasonal allergies    On Levalbuterol inhaler Takes Allegra and Singulair Flovent inhaler        Digestive   GERD (gastroesophageal reflux  disease)    On Omeprazole      IBS (irritable bowel syndrome)    IBS-C, well controlled with Amitiza        Other   Hyperlipidemia (Chronic)    Lipid profile reviewed Continue Atorvastatin      Relevant Orders   Lipid panel   Urinary incontinence    On Oxybutynin      Relevant Medications   oxybutynin (DITROPAN-XL) 10 MG 24 hr tablet    Other Visit Diagnoses    Screening for colon cancer       Relevant Orders   Ambulatory referral to Gastroenterology   Need for hepatitis C screening test       Relevant Orders   Hepatitis C Antibody      Meds ordered this encounter  Medications  . oxybutynin (DITROPAN-XL) 10 MG 24 hr tablet    Sig: Take 1 tablet (10 mg total) by mouth at bedtime.    Dispense:  90 tablet    Refill:  1    Follow-up: Return in about 6 months (around 04/28/2021) for Annual physical.    Lindell Spar, MD

## 2020-10-27 NOTE — Assessment & Plan Note (Signed)
On Levalbuterol inhaler Takes Allegra and Singulair Flovent inhaler

## 2020-10-27 NOTE — Patient Instructions (Signed)
Please continue to take medications as prescribed.  Please get fasting blood tests done before the next visit.  Please continue to follow DASH diet and perform moderate exercise/walking at least 150 mins/week.

## 2020-10-27 NOTE — Assessment & Plan Note (Addendum)
IBS-C, well controlled with Amitiza

## 2020-11-05 ENCOUNTER — Encounter (INDEPENDENT_AMBULATORY_CARE_PROVIDER_SITE_OTHER): Payer: Self-pay | Admitting: *Deleted

## 2020-11-08 ENCOUNTER — Other Ambulatory Visit: Payer: Self-pay | Admitting: Internal Medicine

## 2020-11-08 DIAGNOSIS — J45909 Unspecified asthma, uncomplicated: Secondary | ICD-10-CM

## 2020-11-10 ENCOUNTER — Telehealth (HOSPITAL_COMMUNITY): Payer: Self-pay | Admitting: Psychiatry

## 2020-11-10 NOTE — Telephone Encounter (Signed)
Called to schedule f/u appt left vm

## 2020-11-21 ENCOUNTER — Other Ambulatory Visit: Payer: Self-pay | Admitting: Internal Medicine

## 2020-12-06 ENCOUNTER — Encounter: Payer: Self-pay | Admitting: Internal Medicine

## 2020-12-07 ENCOUNTER — Other Ambulatory Visit: Payer: Self-pay | Admitting: *Deleted

## 2020-12-07 MED ORDER — LISINOPRIL 20 MG PO TABS: 20.0000 mg | ORAL_TABLET | Freq: Every day | ORAL | 0 refills | Status: DC

## 2020-12-09 ENCOUNTER — Ambulatory Visit: Payer: PPO | Admitting: Family

## 2020-12-15 ENCOUNTER — Telehealth (HOSPITAL_COMMUNITY): Payer: Self-pay | Admitting: *Deleted

## 2020-12-15 NOTE — Telephone Encounter (Signed)
Patient is needing refills for all of her medications provider prescribes for her. Patient appt was cancelled due to provider being on medical leave. Message was left to call office to resch appt.

## 2020-12-16 ENCOUNTER — Other Ambulatory Visit (HOSPITAL_COMMUNITY): Payer: Self-pay | Admitting: Psychiatry

## 2020-12-16 MED ORDER — TRAZODONE HCL 100 MG PO TABS: 200.0000 mg | ORAL_TABLET | Freq: Every day | ORAL | 2 refills | Status: DC

## 2020-12-16 MED ORDER — VENLAFAXINE HCL ER 150 MG PO CP24: 150.0000 mg | ORAL_CAPSULE | Freq: Every day | ORAL | 2 refills | Status: DC

## 2020-12-16 MED ORDER — AMPHETAMINE-DEXTROAMPHETAMINE 20 MG PO TABS: 20.0000 mg | ORAL_TABLET | Freq: Two times a day (BID) | ORAL | 0 refills | Status: DC

## 2020-12-16 MED ORDER — ALPRAZOLAM 1 MG PO TABS: 1.0000 mg | ORAL_TABLET | Freq: Three times a day (TID) | ORAL | 2 refills | Status: DC

## 2020-12-16 NOTE — Telephone Encounter (Signed)
sent 

## 2020-12-18 ENCOUNTER — Other Ambulatory Visit: Payer: Self-pay | Admitting: Internal Medicine

## 2020-12-18 DIAGNOSIS — I10 Essential (primary) hypertension: Secondary | ICD-10-CM

## 2020-12-18 MED ORDER — LISINOPRIL 20 MG PO TABS
20.0000 mg | ORAL_TABLET | Freq: Two times a day (BID) | ORAL | 1 refills | Status: DC
Start: 2020-12-18 — End: 2021-07-07

## 2020-12-20 ENCOUNTER — Other Ambulatory Visit: Payer: Self-pay | Admitting: Internal Medicine

## 2020-12-20 DIAGNOSIS — K5900 Constipation, unspecified: Secondary | ICD-10-CM

## 2020-12-20 DIAGNOSIS — J45909 Unspecified asthma, uncomplicated: Secondary | ICD-10-CM

## 2020-12-28 ENCOUNTER — Other Ambulatory Visit: Payer: Self-pay | Admitting: Internal Medicine

## 2020-12-28 DIAGNOSIS — E785 Hyperlipidemia, unspecified: Secondary | ICD-10-CM

## 2021-01-05 ENCOUNTER — Telehealth (HOSPITAL_COMMUNITY): Payer: PPO | Admitting: Psychiatry

## 2021-01-06 ENCOUNTER — Ambulatory Visit (INDEPENDENT_AMBULATORY_CARE_PROVIDER_SITE_OTHER): Payer: PPO

## 2021-01-06 ENCOUNTER — Other Ambulatory Visit: Payer: Self-pay

## 2021-01-06 VITALS — BP 119/77 | HR 78 | Temp 98.4°F | Ht 63.0 in | Wt 145.8 lb

## 2021-01-06 DIAGNOSIS — Z0000 Encounter for general adult medical examination without abnormal findings: Secondary | ICD-10-CM | POA: Diagnosis not present

## 2021-01-06 NOTE — Progress Notes (Addendum)
Subjective:   [name] is a 60 y.o. female who presents for an Initial Medicare Annual Wellness Visit.  Review of Systems    N/A  Cardiac Risk Factors include: dyslipidemia;hypertension     Objective:    Today's Vitals   01/06/21 1551 01/06/21 1556  BP: 119/77   Pulse: 78   Temp: 98.4 F (36.9 C)   TempSrc: Oral   SpO2: 93%   Weight: 145 lb 12 oz (66.1 kg)   Height: 5' 3"  (1.6 m)   PainSc:  7    Body mass index is 25.82 kg/m.  Advanced Directives 01/06/2021 03/18/2018 07/10/2017 04/04/2017 03/30/2017 02/22/2017 07/14/2016  Does Patient Have a Medical Advance Directive? No No No Yes Yes No No  Type of Advance Directive - - - Living will [place];Living will - -  Does patient want to make changes to medical advance directive? - - - - - - -  Copy of [place] in Chart? - - - - No - copy requested - -  Would patient like information on creating a medical advance directive? No - Patient declined - No - Patient declined - - Yes (MAU/Ambulatory/Procedural Areas - Information given) No - Patient declined  Pre-existing out of facility DNR order (yellow form or pink MOST form) - - - - - - -    Current Medications (verified) Outpatient Encounter Medications as of 01/06/2021  Medication Sig   acetaminophen (TYLENOL) 500 MG tablet Take 1,000 mg by mouth every 6 (six) hours as needed for moderate pain.    ALPRAZolam (XANAX) 1 MG tablet Take 1 tablet (1 mg total) by mouth 3 (three) times daily.   amLODipine (NORVASC) 5 MG tablet TAKE ONE TABLET BY MOUTH ONCE DAILY.   amphetamine-dextroamphetamine (ADDERALL) 20 MG tablet Take 1 tablet (20 mg total) by mouth 2 (two) times daily.   amphetamine-dextroamphetamine (ADDERALL) 20 MG tablet Take 1 tablet (20 mg total) by mouth 2 (two) times daily.   atorvastatin (LIPITOR) 20 MG tablet TAKE ONE TABLET BY MOUTH ONCE DAILY.   B Complex-C (SUPER B COMPLEX PO) Take 1 tablet by mouth daily.    docusate sodium  (COLACE) 100 MG capsule Take 100 mg by mouth daily.   lisinopril (ZESTRIL) 20 MG tablet Take 1 tablet (20 mg total) by mouth 2 (two) times daily.   lubiprostone (AMITIZA) 8 MCG capsule TAKE 1 CAPSULE TWICE DAILY WITH MEALS.   montelukast (SINGULAIR) 10 MG tablet TAKE (1) TABLET BY MOUTH AT BEDTIME.   Multiple Vitamin (MULITIVITAMIN WITH MINERALS) TABS Take 1 tablet by mouth daily.   nabumetone (RELAFEN) 500 MG tablet TAKE (1) TABLET BY MOUTH EVERY EIGHT HOURS AS NEEDED FOR JOINT PAIN.   omeprazole (PRILOSEC) 40 MG capsule Take 1 capsule (40 mg total) by mouth daily.   oxybutynin (DITROPAN-XL) 10 MG 24 hr tablet Take 1 tablet (10 mg total) by mouth at bedtime.   Potassium 99 MG TABS Take 99 mg by mouth daily.    traZODone (DESYREL) 100 MG tablet Take 2 tablets (200 mg total) by mouth at bedtime.   venlafaxine XR (EFFEXOR XR) 150 MG 24 hr capsule Take 1 capsule (150 mg total) by mouth daily with breakfast.   azelastine (ASTELIN) 0.1 % nasal spray 2 sprays each nostril 1-2 times daily as needed (Patient not taking: Reported on 01/06/2021)   ciclesonide (ALVESCO) 80 MCG/ACT inhaler Inhale 1 puff into the lungs 2 (two) times daily. (Patient not taking: Reported on 01/06/2021)  diclofenac sodium (VOLTAREN) 1 % GEL Apply 2 g topically 4 (four) times daily. (Patient not taking: Reported on 01/06/2021)   fluticasone (FLOVENT HFA) 44 MCG/ACT inhaler Inhale 2 puffs into the lungs in the morning and at bedtime. (Patient not taking: Reported on 01/06/2021)   levalbuterol (XOPENEX HFA) 45 MCG/ACT inhaler Inhale 2 puffs into the lungs every 4 (four) hours as needed for wheezing. (Patient not taking: Reported on 01/06/2021)   No facility-administered encounter medications on file as of 01/06/2021.    Allergies (verified) Morphine and related and Promethazine hcl   History: Past Medical History:  Diagnosis Date   Allergy    grass, dust , mold   Anxiety    Arthritis    Asthma due to seasonal allergies 06/15/2020    Bipolar disorder (HCC)    Cancer ([place])    cervical cancer   Carpal tunnel syndrome    Bilateral   Chest pain 09/2011   Cardiac cath-normal coronaries   Constipation    Depression    Difficulty urinating 05/31/2013   Elevated LFTs 12/16/2013   GERD (gastroesophageal reflux disease)    History of kidney stones    Hyperlipemia    Hyperlipidemia    Hypertension    Mild; provoked by stress and anxiety   IBS (irritable bowel syndrome)    Intracerebral bleed ([place])    No aneurysm; followed by Dr. Sherwood Gambler   Loss of weight 01/06/2015   Osteoporosis    Stroke ([place]) 1999   hemorrhagic stroke; weakness of left side   Past Surgical History:  Procedure Laterality Date   ABDOMINAL HYSTERECTOMY     "cancer cells"   [place]   to remove blood clot after stroke    CARDIAC CATHETERIZATION  2016   CERVICAL FUSION     CHOLECYSTECTOMY N/A 10/14/2014   Procedure: LAPAROSCOPIC CHOLECYSTECTOMY WITH INTRAOPERATIVE CHOLANGIOGRAM;  Surgeon: Jackolyn Confer, MD;  Location: [facility];  Service: General;  Laterality: N/A;   CHONDROPLASTY Right 07/13/2017   Procedure: CHONDROPLASTY of patella;  Surgeon: Carole Civil, MD;  Location: [facility];  Service: Orthopedics;  Laterality: Right;   EUS N/A 08/21/2015   Procedure: ESOPHAGEAL ENDOSCOPIC ULTRASOUND (EUS) RADIAL;  Surgeon: Carol Ada, MD;  Location: [facility];  Service: Endoscopy;  Laterality: N/A;   KNEE ARTHROSCOPY WITH MEDIAL MENISECTOMY Right 07/13/2017   Procedure: KNEE ARTHROSCOPY WITH PARTIAL MEDIAL MENISECTOMY;  Surgeon: Carole Civil, MD;  Location: [facility];  Service: Orthopedics;  Laterality: Right;   LEFT HEART CATHETERIZATION WITH CORONARY ANGIOGRAM N/A 09/23/2011   Procedure: LEFT HEART CATHETERIZATION WITH CORONARY ANGIOGRAM;  Surgeon: Thayer Headings, MD;  [facility];  Service: Cardiovascular;  Laterality: N/A;   LIPOMA EXCISION Left 11/18/2013   Procedure: EXCISION OF SOFT TISSUE MASS-LEFT THIGH;  Surgeon: Jamesetta So, MD;  Location: [facility];  Service: General;  Laterality: Left;   RECTOCELE REPAIR     x2   RECTOCELE REPAIR N/A 04/04/2017   Procedure: POSTERIOR REPAIR (RECTOCELE);  Surgeon: Jonnie Kind, MD;  Location: [facility];  Service: Gynecology;  Laterality: N/A;   Family History  Problem Relation Age of Onset   Cancer Mother        breast    Hypertension Mother    Hyperlipidemia Mother    Depression Mother    Anxiety disorder Mother    COPD Mother    Arthritis Mother        rheumatoid   Drug abuse Sister    Coronary  artery disease Paternal Grandfather    Coronary artery disease Paternal Uncle    Depression Cousin    Drug abuse Cousin    Social History   Socioeconomic History   Marital status: Married    Spouse name: Sonia Side   Number of children: 0   Years of education: HS   Highest education level: Not on file  Occupational History   Occupation: unemployed    Comment: pending disability  Tobacco Use   Smoking status: Former    Packs/day: 1.00    Years: 19.00    Pack years: 19.00    Types: Cigarettes    Quit date: 09/01/1997    Years since quitting: 23.3   Smokeless tobacco: Never   Tobacco comments:    Quit smoking 1999 , previous 20 pack years  Vaping Use   Vaping Use: Never used  Substance and Sexual Activity   Alcohol use: Yes    Comment: 1 drink every other week   Drug use: No   Sexual activity: Not Currently    Partners: Male    Birth control/protection: Surgical    Comment: hyst   Other Topics Concern   Not on file  Social History Narrative   Currently unable to work   Lives in [place]   Married   Patient drinks 1 cup of caffeine daily.   Patient is right handed.    [facility]ined [facility] [employer] to get more exercise   Social Determinants of Health   Financial Resource Strain: Low Risk    Difficulty of Paying Living Expenses: Not hard at all  Food Insecurity: No Food Insecurity   Worried About Charity fundraiser in the Last Year: Never true   Arboriculturist  in the Last Year: Never true  Transportation Needs: No Transportation Needs   Lack of Transportation (Medical): No   Lack of Transportation (Non-Medical): No  Physical Activity: Inactive   Days of Exercise per Week: 0 days   Minutes of Exercise per Session: 0 min  Stress: No Stress Concern Present   Feeling of Stress : Not at all  Social Connections: Moderately Isolated   Frequency of Communication with Friends and Family: More than three times a week   Frequency of Social Gatherings with Friends and Family: More than three times a week   Attends Religious Services: Never   Marine scientist or Organizations: No   Attends Music therapist: Never   Marital Status: Married    Tobacco Counseling Counseling given: Not Answered Tobacco comments: Quit smoking 1999 , previous 20 pack years   Clinical Intake:  Pre-visit preparation completed: Yes  Pain : 0-10 Pain Score: 7  Pain Type: Chronic pain Pain Location: Back Pain Orientation: Lower Pain Descriptors / Indicators: Aching Pain Onset: More than a month ago Pain Frequency: Intermittent Pain Relieving Factors: back brace  Pain Relieving Factors: back brace  Nutritional Risks: Nausea/ vomitting/ diarrhea (Diarrhea) Diabetes: No  How often do you need to have someone help you when you read instructions, pamphlets, or other written materials from your doctor or pharmacy?: 1 - Never  Diabetic?No   Interpreter Needed?: No  Information entered by :: [place] of Daily Living In your present state of health, do you have any difficulty performing the following activities: 01/06/2021  Hearing? N  Vision? N  Difficulty concentrating or making decisions? Y  Comment has some short term memory loss  Walking or climbing stairs? N  Dressing or bathing?  N  Doing errands, shopping? N  Preparing Food and eating ? N  Using the Toilet? N  In the past six months, have you accidently leaked urine? Y   Comment has occassional bladder leakage with urgency  Do you have problems with loss of bowel control? N  Managing your Medications? N  Managing your Finances? N  Housekeeping or managing your Housekeeping? N  Some recent data might be hidden    Patient Care Team: Lindell Spar, MD as PCP - General (Internal Medicine)  Indicate any recent Medical Services you may have received from other than [facility] providers in the past year (date may be approximate).     Assessment:   This is a routine wellness examination for [name].  Hearing/Vision screen Vision Screening - Comments:: Patient stated gets eyes examined once per year. Has hx of eye surgery   Dietary issues and exercise activities discussed: Current Exercise Habits: The patient does not participate in regular exercise at present, Exercise limited by: None identified   Goals Addressed   None    Depression Screen PHQ 2/9 Scores 01/06/2021 10/27/2020 10/06/2020 07/29/2020 07/09/2020 06/29/2020 06/19/2020  PHQ - 2 Score 0 0 0 0 0 0 0  PHQ- 9 Score - - 0 - - 1 -  Some encounter information is confidential and restricted. Go to Review Flowsheets activity to see all data.    Fall Risk Fall Risk  01/06/2021 10/27/2020 10/06/2020 07/29/2020 07/09/2020  Falls in the past year? 1 0 0 0 0  Number falls in past yr: 1 0 0 0 0  Injury with Fall? 0 0 0 0 0  Risk for fall due to : Orthopedic patient No Fall Risks No Fall Risks No Fall Risks No Fall Risks  Follow up Falls evaluation completed;Falls prevention discussed Falls evaluation completed Falls evaluation completed Falls evaluation completed Falls evaluation completed    FALL RISK PREVENTION PERTAINING TO THE HOME:  Any stairs in or around the home? No  If so, are there any without handrails? No  Home free of loose throw rugs in walkways, pet beds, electrical cords, etc? Yes  Adequate lighting in your home to reduce risk of falls? Yes   ASSISTIVE DEVICES UTILIZED TO PREVENT FALLS:  Life  alert? No  Use of a cane, walker or w/c? No  Grab bars in the bathroom? No  Shower chair or bench in shower? No  Elevated toilet seat or a handicapped toilet? No   TIMED UP AND GO:  Was the test performed? Yes .  Length of time to ambulate 10 feet: 3 sec.   Gait steady and fast without use of assistive device  Cognitive Function: MMSE - Mini Mental State Exam 02/13/2015  Orientation to time 4  Orientation to Place 5  Registration 2  Attention/ Calculation 5  Recall 2  Language- name 2 objects 2  Language- repeat 1  Language- follow 3 step command 3  Language- read & follow direction 1  Write a sentence 1  Copy design 0  Total score 26        Immunizations Immunization History  Administered Date(s) Administered   Influenza Split 08/13/2012, 05/11/2013   Influenza,inj,Quad PF,6+ Mos 05/11/2015, 03/24/2019   Influenza-Unspecified 05/10/2014, 04/17/2017, 03/16/2020   PFIZER(Purple Top)SARS-COV-2 Vaccination 09/26/2019, 10/19/2019, 08/12/2020   Tdap 04/30/2008, 02/12/2013    TDAP status: Up to date  Flu Vaccine status: Up to date  Pneumococcal vaccine status: Up to date  Covid-19 vaccine status: Completed vaccines  Qualifies for  Shingles Vaccine? Yes   Zostavax completed No   Shingrix Completed?: No.    Education has been provided regarding the importance of this vaccine. Patient has been advised to call insurance company to determine out of pocket expense if they have not yet received this vaccine. Advised may also receive vaccine at local pharmacy or Health Dept. Verbalized acceptance and understanding.  Screening Tests Health Maintenance  Topic Date Due   Hepatitis C Screening  Never done   Zoster Vaccines- Shingrix (1 of 2) Never done   COLONOSCOPY (Pts 45-39yr Insurance coverage will need to be confirmed)  10/12/2015   COVID-19 Vaccine (4 - Booster for Pfizer series) 12/10/2020   INFLUENZA VACCINE  02/01/2021   MAMMOGRAM  02/04/2021   TETANUS/TDAP   02/13/2023   HIV Screening  Completed   Pneumococcal Vaccine [id number] old  Aged Out   [place]PV [place] Due  Topic Date Due   Hepatitis C Screening  Never done   Zoster Vaccines- Shingrix (1 of 2) Never done   COLONOSCOPY (Pts 45-489yrInsurance coverage will need to be confirmed)  10/12/2015   COVID-19 Vaccine (4 - Booster for [place]) 12/10/2020    Colorectal cancer screening: Referral to GI placed 01/06/2021. Pt aware the office will call re: appt.  Mammogram status: Completed 02/05/2020. Repeat every year  Bone Density Screening: Not due until age 58 Lung Cancer Screening: (Low Dose CT Chest recommended if Age 58-80ears, 30 pack-year currently smoking OR have quit w/in 15years.) does not qualify.   Lung Cancer Screening Referral: N/A   Additional Screening:  Hepatitis C Screening: does qualify;   Vision Screening: Recommended annual ophthalmology exams for early detection of glaucoma and other disorders of the eye. Is the patient up to date with their annual eye exam?  Yes  Who is the provider or what is the name of the office in which the patient attends annual eye exams? Dr. ChTenna ChildIf pt is not established with a provider, would they like to be referred to a provider to establish care? No .   Dental Screening: Recommended annual dental exams for proper oral hygiene  Community Resource Referral / Chronic Care Management: CRR required this visit?  No   CCM required this visit?  No      Plan:     I have personally reviewed and noted the following in the patient's chart:   Medical and social history Use of alcohol, tobacco or illicit drugs  Current medications and supplements including opioid prescriptions. Patient is not currently taking opioid prescriptions. Functional ability and status Nutritional status Physical activity Advanced directives List of other physicians Hospitalizations,  surgeries, and ER visits in previous 12 months Vitals Screenings to include cognitive, depression, and falls Referrals and appointments  In addition, I have reviewed and discussed with patient certain preventive protocols, quality metrics, and best practice recommendations. A written personalized care plan for preventive services as well as general preventive health recommendations were provided to patient.     ShOfilia NeasLPN   7/5/0/9326 Nurse Notes: None

## 2021-01-06 NOTE — Patient Instructions (Signed)
[name] , Thank you for taking time to come for your Medicare Wellness Visit. I appreciate your ongoing commitment to your health goals. Please review the following plan we discussed and let me know if I can assist you in the future.   Screening recommendations/referrals: Colonoscopy: Currently due orders placed this visit  Mammogram: Up to date, next due 02/04/2021 Bone Density: Not due until age 58  Recommended yearly ophthalmology/optometry visit for glaucoma screening and checkup Recommended yearly dental visit for hygiene and checkup  Vaccinations: Influenza vaccine: Up to date, next due fall 2022  Pneumococcal vaccine: Not due until age 75  Tdap vaccine: Up to date, next due 02/13/2023 Shingles vaccine: Currently due for Shingrix, we recommend that you receive at your local pharmacy.     Advanced directives: Advance directive discussed with you today. Even though you declined this today please call our office should you change your mind and we can give you the proper paperwork for you to fill out.   Conditions/risks identified: None   Next appointment: 04/28/2021 @ 1:00 PM with Dr. Posey Pronto   Preventive Care 40-64 Years, Female Preventive care refers to lifestyle choices and visits with your health care provider that can promote health and wellness. What does preventive care include? A yearly physical exam. This is also called an annual well check. Dental exams once or twice a year. Routine eye exams. Ask your health care provider how often you should have your eyes checked. Personal lifestyle choices, including: Daily care of your teeth and gums. Regular physical activity. Eating a healthy diet. Avoiding tobacco and drug use. Limiting alcohol use. Practicing safe sex. Taking low-dose aspirin daily starting at age 81. Taking vitamin and mineral supplements as recommended by your health care provider. What happens during an annual well check? The services and screenings  done by your health care provider during your annual well check will depend on your age, overall health, lifestyle risk factors, and family history of disease. Counseling  Your health care provider may ask you questions about your: Alcohol use. Tobacco use. Drug use. Emotional well-being. Home and relationship well-being. Sexual activity. Eating habits. Work and work Statistician. Method of birth control. Menstrual cycle. Pregnancy history. Screening  You may have the following tests or measurements: Height, weight, and BMI. Blood pressure. Lipid and cholesterol levels. These may be checked every 5 years, or more frequently if you are over 46 years old. Skin check. Lung cancer screening. You may have this screening every year starting at age 73 if you have a 30-pack-year history of smoking and currently smoke or have quit within the past 15 years. Fecal occult blood test (FOBT) of the stool. You may have this test every year starting at age 79. Flexible sigmoidoscopy or colonoscopy. You may have a sigmoidoscopy every 5 years or a colonoscopy every 10 years starting at age 37. Hepatitis C blood test. Hepatitis B blood test. Sexually transmitted disease (STD) testing. Diabetes screening. This is done by checking your blood sugar (glucose) after you have not eaten for a while (fasting). You may have this done every 1-3 years. Mammogram. This may be done every 1-2 years. Talk to your health care provider about when you should start having regular mammograms. This may depend on whether you have a family history of breast cancer. BRCA-related cancer screening. This may be done if you have a family history of breast, ovarian, tubal, or peritoneal cancers. Pelvic exam and Pap test. This may be done every 3 years  starting at age 21. Starting at age 6, this may be done every 5 years if you have a Pap test in combination with an HPV test. Bone density scan. This is done to screen for osteoporosis.  You may have this scan if you are at high risk for osteoporosis. Discuss your test results, treatment options, and if necessary, the need for more tests with your health care provider. Vaccines  Your health care provider may recommend certain vaccines, such as: Influenza vaccine. This is recommended every year. Tetanus, diphtheria, and acellular pertussis (Tdap, Td) vaccine. You may need a Td booster every 10 years. Zoster vaccine. You may need this after age 16. Pneumococcal 13-valent conjugate (PCV13) vaccine. You may need this if you have certain conditions and were not previously vaccinated. Pneumococcal polysaccharide (PPSV23) vaccine. You may need one or two doses if you smoke cigarettes or if you have certain conditions. Talk to your health care provider about which screenings and vaccines you need and how often you need them. This information is not intended to replace advice given to you by your health care provider. Make sure you discuss any questions you have with your health care provider. Document Released: 07/17/2015 Document Revised: 03/09/2016 Document Reviewed: 04/21/2015 Elsevier Interactive Patient Education  2017 [place] Prevention in the Home Falls can cause injuries. They can happen to people of all ages. There are many things you can do to make your home safe and to help prevent falls. What can I do on the outside of my home? Regularly fix the edges of walkways and driveways and fix any cracks. Remove anything that might make you trip as you walk through a door, such as a raised step or threshold. Trim any bushes or trees on the path to your home. Use bright outdoor lighting. Clear any walking paths of anything that might make someone trip, such as rocks or tools. Regularly check to see if handrails are loose or broken. Make sure that both sides of any steps have handrails. Any raised decks and porches should have guardrails on the edges. Have any  leaves, snow, or ice cleared regularly. Use sand or salt on walking paths during winter. Clean up any spills in your garage right away. This includes oil or grease spills. What can I do in the bathroom? Use night lights. Install grab bars by the toilet and in the tub and shower. Do not use towel bars as grab bars. Use non-skid mats or decals in the tub or shower. If you need to sit down in the shower, use a plastic, non-slip stool. Keep the floor dry. Clean up any water that spills on the floor as soon as it happens. Remove soap buildup in the tub or shower regularly. Attach bath mats securely with double-sided non-slip rug tape. Do not have throw rugs and other things on the floor that can make you trip. What can I do in the bedroom? Use night lights. Make sure that you have a light by your bed that is easy to reach. Do not use any sheets or blankets that are too big for your bed. They should not hang down onto the floor. Have a firm chair that has side arms. You can use this for support while you get dressed. Do not have throw rugs and other things on the floor that can make you trip. What can I do in the kitchen? Clean up any spills right away. Avoid walking on wet floors. Keep  items that you use a lot in easy-to-reach places. If you need to reach something above you, use a strong step stool that has a grab bar. Keep electrical cords out of the way. Do not use floor polish or wax that makes floors slippery. If you must use wax, use non-skid floor wax. Do not have throw rugs and other things on the floor that can make you trip. What can I do with my stairs? Do not leave any items on the stairs. Make sure that there are handrails on both sides of the stairs and use them. Fix handrails that are broken or loose. Make sure that handrails are as long as the stairways. Check any carpeting to make sure that it is firmly attached to the stairs. Fix any carpet that is loose or worn. Avoid  having throw rugs at the top or bottom of the stairs. If you do have throw rugs, attach them to the floor with carpet tape. Make sure that you have a light switch at the top of the stairs and the bottom of the stairs. If you do not have them, ask someone to add them for you. What else can I do to help prevent falls? Wear shoes that: Do not have high heels. Have rubber bottoms. Are comfortable and fit you well. Are closed at the toe. Do not wear sandals. If you use a stepladder: Make sure that it is fully opened. Do not climb a closed stepladder. Make sure that both sides of the stepladder are locked into place. Ask someone to hold it for you, if possible. Clearly mark and make sure that you can see: Any grab bars or handrails. First and last steps. Where the edge of each step is. Use tools that help you move around (mobility aids) if they are needed. These include: Canes. Walkers. Scooters. Crutches. Turn on the lights when you go into a dark area. Replace any light bulbs as soon as they burn out. Set up your furniture so you have a clear path. Avoid moving your furniture around. If any of your floors are uneven, fix them. If there are any pets around you, be aware of where they are. Review your medicines with your doctor. Some medicines can make you feel dizzy. This can increase your chance of falling. Ask your doctor what other things that you can do to help prevent falls. This information is not intended to replace advice given to you by your health care provider. Make sure you discuss any questions you have with your health care provider. Document Released: 04/16/2009 Document Revised: 11/26/2015 Document Reviewed: 07/25/2014 Elsevier Interactive Patient Education  2017 [employer].

## 2021-01-07 ENCOUNTER — Other Ambulatory Visit (HOSPITAL_COMMUNITY): Payer: Self-pay | Admitting: Internal Medicine

## 2021-01-07 DIAGNOSIS — Z1231 Encounter for screening mammogram for malignant neoplasm of breast: Secondary | ICD-10-CM

## 2021-01-08 ENCOUNTER — Other Ambulatory Visit: Payer: Self-pay | Admitting: Nurse Practitioner

## 2021-01-16 ENCOUNTER — Other Ambulatory Visit: Payer: Self-pay | Admitting: Internal Medicine

## 2021-01-16 DIAGNOSIS — J45909 Unspecified asthma, uncomplicated: Secondary | ICD-10-CM

## 2021-01-19 ENCOUNTER — Encounter: Payer: Self-pay | Admitting: Internal Medicine

## 2021-01-20 NOTE — Telephone Encounter (Signed)
Appointment scheduled.

## 2021-01-28 ENCOUNTER — Ambulatory Visit (INDEPENDENT_AMBULATORY_CARE_PROVIDER_SITE_OTHER): Payer: PPO | Admitting: Internal Medicine

## 2021-01-28 ENCOUNTER — Encounter: Payer: Self-pay | Admitting: Internal Medicine

## 2021-01-28 ENCOUNTER — Other Ambulatory Visit: Payer: Self-pay

## 2021-01-28 VITALS — BP 115/74 | HR 76 | Temp 98.2°F | Resp 18 | Ht 63.0 in | Wt 142.0 lb

## 2021-01-28 DIAGNOSIS — M1990 Unspecified osteoarthritis, unspecified site: Secondary | ICD-10-CM

## 2021-01-28 DIAGNOSIS — I10 Essential (primary) hypertension: Secondary | ICD-10-CM | POA: Diagnosis not present

## 2021-01-28 DIAGNOSIS — K760 Fatty (change of) liver, not elsewhere classified: Secondary | ICD-10-CM | POA: Diagnosis not present

## 2021-01-28 DIAGNOSIS — N898 Other specified noninflammatory disorders of vagina: Secondary | ICD-10-CM

## 2021-01-28 MED ORDER — NYSTATIN 100000 UNIT/GM EX POWD: 1.0000 "application " | Freq: Three times a day (TID) | CUTANEOUS | 2 refills | Status: DC

## 2021-01-28 NOTE — Patient Instructions (Signed)
Please stop taking Amlodipine and continue to take Lisinopril for now.  Please take Tylenol and Nabumetone as needed for joint pain.  Continue to use Nystatin powder as needed for itching.

## 2021-01-29 ENCOUNTER — Telehealth (INDEPENDENT_AMBULATORY_CARE_PROVIDER_SITE_OTHER): Payer: PPO | Admitting: Psychiatry

## 2021-01-29 ENCOUNTER — Encounter (HOSPITAL_COMMUNITY): Payer: Self-pay | Admitting: Psychiatry

## 2021-01-29 ENCOUNTER — Other Ambulatory Visit: Payer: Self-pay | Admitting: Internal Medicine

## 2021-01-29 DIAGNOSIS — K5900 Constipation, unspecified: Secondary | ICD-10-CM

## 2021-01-29 DIAGNOSIS — F332 Major depressive disorder, recurrent severe without psychotic features: Secondary | ICD-10-CM | POA: Diagnosis not present

## 2021-01-29 DIAGNOSIS — M1990 Unspecified osteoarthritis, unspecified site: Secondary | ICD-10-CM | POA: Diagnosis not present

## 2021-01-29 MED ORDER — AMPHETAMINE-DEXTROAMPHETAMINE 20 MG PO TABS: 20.0000 mg | ORAL_TABLET | Freq: Two times a day (BID) | ORAL | 0 refills | Status: DC

## 2021-01-29 MED ORDER — VENLAFAXINE HCL ER 150 MG PO CP24: 150.0000 mg | ORAL_CAPSULE | Freq: Every day | ORAL | 2 refills | Status: DC

## 2021-01-29 MED ORDER — TRAZODONE HCL 100 MG PO TABS: 200.0000 mg | ORAL_TABLET | Freq: Every day | ORAL | 2 refills | Status: DC

## 2021-01-29 MED ORDER — ALPRAZOLAM 1 MG PO TABS: 1.0000 mg | ORAL_TABLET | Freq: Three times a day (TID) | ORAL | 2 refills | Status: DC

## 2021-01-29 NOTE — Progress Notes (Signed)
Virtual Visit via Telephone Note  I connected with [place] on 01/29/21 at 11:40 AM EDT by telephone and verified that I am speaking with the correct person using two identifiers.  Location: Patient: home Provider: home office   I discussed the limitations, risks, security and privacy concerns of performing an evaluation and management service by telephone and the availability of in person appointments. I also discussed with the patient that there may be a patient responsible charge related to this service. The patient expressed understanding and agreed to proceed.     I discussed the assessment and treatment plan with the patient. The patient was provided an opportunity to ask questions and all were answered. The patient agreed with the plan and demonstrated an understanding of the instructions.   The patient was advised to call back or seek an in-person evaluation if the symptoms worsen or if the condition fails to improve as anticipated.  I provided 15 minutes of non-face-to-face time during this encounter.   [name], MD  [facility] MD/PA/NP OP Progress Note  01/29/2021 11:48 AM [name]  MRN:  [id number]  Chief Complaint:  Chief Complaint   Anxiety; Depression; Family Problem    HPI: T his patient is a 60 year old married white female who lives with her husband in [place]. She has no children. 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   The patient was referred by her primary physician, Dr. [name], for further assessment and treatment of depression anxiety and focus problems.   The patient states that she did well most of her life until she had a stroke in 33 at age 3. She had a cerebral aneurysm that burst and she had to have a hematoma evacuated from the right frontal lobe. She has had resulting difficulties ever since and still has weakness on the left side of her body and poor fine motor skills in her left  hand. She is right handed. She states that she gets older her symptoms worsen.   The patient often feels anxious particular in crowds. She has frequent panic attacks. She takes Xanax 1 mg twice a day but is reluctant to take more. Shortly after she had her stroke she saw psychiatrist in [place] and was placed on the Xanax. Dr. [name] later put her on Effexor and she is now up to 150 mg per day. [name] not sure if it's helping. She has difficulty sleeping but trazodone helps to some degree. She also is significant problems with focus and attention span. Her thoughts ramble. She'll start one thing and go the next. She can't complete tasks. She finds this extremely frustrating. Her mood is labile at times and she'll get angry quickly and for no reason. She denies auditory or visual hallucinations or paranoia. She does not use drugs and very rarely takes a drink. She admits that time she has passive suicidal ideation but would never hurt her self because of her faith  The patient returns for follow-up after 3 months.  She states overall she is doing well.  She still has a lot of joint pain from arthritis and her doctor is trying to delineate what type of arthritis she really has.  She is staying active and doing yard work and doing other things with her husband.  Her mood is good and the Adderall has helped her focus and energy.  She is sleeping well.  She denies serious depression anxiety or  suicidal ideation Visit Diagnosis:    ICD-10-CM   1. Major depressive disorder, recurrent, severe without psychotic features ([place])  F33.2       Past Psychiatric History: Past outpatient treatment for depression  Past Medical History:  Past Medical History:  Diagnosis Date   Allergy    grass, dust , mold   Anxiety    Arthritis    Asthma due to seasonal allergies 06/15/2020   Bipolar disorder ([place])    Cancer ([place])    cervical cancer   Carpal tunnel syndrome    Bilateral   Chest pain 09/2011   Cardiac  cath-normal coronaries   Constipation    Depression    Difficulty urinating 05/31/2013   Elevated LFTs 12/16/2013   GERD (gastroesophageal reflux disease)    History of kidney stones    Hyperlipemia    Hyperlipidemia    Hypertension    Mild; provoked by stress and anxiety   IBS (irritable bowel syndrome)    Intracerebral bleed ([place])    No aneurysm; followed by Dr. Sherwood Gambler   Loss of weight 01/06/2015   Osteoporosis    Stroke ([place]) 1999   hemorrhagic stroke; weakness of left side    Past Surgical History:  Procedure Laterality Date   ABDOMINAL HYSTERECTOMY     "cancer cells"   [place]   to remove blood clot after stroke    CARDIAC CATHETERIZATION  2016   CERVICAL FUSION     CHOLECYSTECTOMY N/A 10/14/2014   Procedure: LAPAROSCOPIC CHOLECYSTECTOMY WITH INTRAOPERATIVE CHOLANGIOGRAM;  Surgeon: Jackolyn Confer, MD;  Location: [facility];  Service: General;  Laterality: N/A;   CHONDROPLASTY Right 07/13/2017   Procedure: CHONDROPLASTY of patella;  Surgeon: Carole Civil, MD;  Location: [facility];  Service: Orthopedics;  Laterality: Right;   EUS N/A 08/21/2015   Procedure: ESOPHAGEAL ENDOSCOPIC ULTRASOUND (EUS) RADIAL;  Surgeon: Carol Ada, MD;  Location: [facility];  Service: Endoscopy;  Laterality: N/A;   KNEE ARTHROSCOPY WITH MEDIAL MENISECTOMY Right 07/13/2017   Procedure: KNEE ARTHROSCOPY WITH PARTIAL MEDIAL MENISECTOMY;  Surgeon: Carole Civil, MD;  Location: [facility];  Service: Orthopedics;  Laterality: Right;   LEFT HEART CATHETERIZATION WITH CORONARY ANGIOGRAM N/A 09/23/2011   Procedure: LEFT HEART CATHETERIZATION WITH CORONARY ANGIOGRAM;  Surgeon: Thayer Headings, MD;  [facility];  Service: Cardiovascular;  Laterality: N/A;   LIPOMA EXCISION Left 11/18/2013   Procedure: EXCISION OF SOFT TISSUE MASS-LEFT THIGH;  Surgeon: Jamesetta So, MD;  Location: [facility];  Service: General;  Laterality: Left;   RECTOCELE REPAIR     x2   RECTOCELE REPAIR N/A 04/04/2017    Procedure: POSTERIOR REPAIR (RECTOCELE);  Surgeon: Jonnie Kind, MD;  Location: [facility];  Service: Gynecology;  Laterality: N/A;    Family Psychiatric History: see below  Family History:  Family History  Problem Relation Age of Onset   Cancer Mother        breast    Hypertension Mother    Hyperlipidemia Mother    Depression Mother    Anxiety disorder Mother    COPD Mother    Arthritis Mother        rheumatoid   Drug abuse Sister    Coronary artery disease Paternal Grandfather    Coronary artery disease Paternal Uncle    Depression Cousin    Drug abuse Cousin     Social History:  Social History   Socioeconomic History   Marital status: Married    Spouse name: Sonia Side  Number of children: 0   Years of education: HS   Highest education level: Not on file  Occupational History   Occupation: unemployed    Comment: pending disability  Tobacco Use   Smoking status: Former    Packs/day: 1.00    Years: 19.00    Pack years: 19.00    Types: Cigarettes    Quit date: 09/01/1997    Years since quitting: 23.4   Smokeless tobacco: Never   Tobacco comments:    Quit smoking 1999 , previous 20 pack years  Vaping Use   Vaping Use: Never used  Substance and Sexual Activity   Alcohol use: Yes    Comment: 1 drink every other week   Drug use: No   Sexual activity: Not Currently    Partners: Male    Birth control/protection: Surgical    Comment: hyst   Other Topics Concern   Not on file  Social History Narrative   Currently unable to work   Lives in [place]   Married   Patient drinks 1 cup of caffeine daily.   Patient is right handed.    Joined the [employer] to get more exercise   Social Determinants of Health   Financial Resource Strain: Low Risk    Difficulty of Paying Living Expenses: Not hard at all  Food Insecurity: No Food Insecurity   Worried About Charity fundraiser in the Last Year: Never true   Arboriculturist in the Last Year: Never true  Transportation  Needs: No Transportation Needs   Lack of Transportation (Medical): No   Lack of Transportation (Non-Medical): No  Physical Activity: Inactive   Days of Exercise per Week: 0 days   Minutes of Exercise per Session: 0 min  Stress: No Stress Concern Present   Feeling of Stress : Not at all  Social Connections: Moderately Isolated   Frequency of Communication with Friends and Family: More than three times a week   Frequency of Social Gatherings with Friends and Family: More than three times a week   Attends Religious Services: Never   Marine scientist or Organizations: No   Attends Archivist Meetings: Never   Marital Status: Married    Allergies:  Allergies  Allergen Reactions   Morphine And Related Hives   Promethazine Hcl Other (See Comments)    Causes patient to become Hyper    Metabolic Disorder Labs: Lab Results  Component Value Date   HGBA1C 5.3 06/02/2017   MPG 117 (H) 01/06/2015   MPG 117 (H) 02/17/2014   No results found for: PROLACTIN Lab Results  Component Value Date   CHOL 210 (H) 08/31/2020   TRIG 235 (H) 08/31/2020   HDL 65 08/31/2020   CHOLHDL 3.2 08/31/2020   VLDL 26 09/16/2015   LDLCALC 105 (H) 08/31/2020   LDLCALC 102 (H) 06/02/2017   Lab Results  Component Value Date   TSH 1.770 08/31/2020   TSH 1.438 09/20/2011    Therapeutic Level Labs: No results found for: LITHIUM No results found for: VALPROATE No components found for:  CBMZ  Current Medications: Current Outpatient Medications  Medication Sig Dispense Refill   amphetamine-dextroamphetamine (ADDERALL) 20 MG tablet Take 1 tablet (20 mg total) by mouth 2 (two) times daily. Fill after 02/28/2021 90 tablet 0   acetaminophen (TYLENOL) 500 MG tablet Take 1,000 mg by mouth every 6 (six) hours as needed for moderate pain.      ALPRAZolam (XANAX) 1 MG tablet Take 1  tablet (1 mg total) by mouth 3 (three) times daily. 90 tablet 2   amphetamine-dextroamphetamine (ADDERALL) 20 MG tablet  Take 1 tablet (20 mg total) by mouth 2 (two) times daily. 60 tablet 0   amphetamine-dextroamphetamine (ADDERALL) 20 MG tablet Take 1 tablet (20 mg total) by mouth 2 (two) times daily. 30 tablet 0   atorvastatin (LIPITOR) 20 MG tablet TAKE ONE TABLET BY MOUTH ONCE DAILY. 90 tablet 0   B Complex-C (SUPER B COMPLEX PO) Take 1 tablet by mouth daily.      docusate sodium (COLACE) 100 MG capsule Take 100 mg by mouth daily.     fluticasone (FLOVENT HFA) 44 MCG/ACT inhaler Inhale 2 puffs into the lungs in the morning and at bedtime. 1 each 12   lisinopril (ZESTRIL) 20 MG tablet Take 1 tablet (20 mg total) by mouth 2 (two) times daily. 180 tablet 1   lubiprostone (AMITIZA) 8 MCG capsule TAKE 1 CAPSULE TWICE DAILY WITH MEALS. 60 capsule 0   montelukast (SINGULAIR) 10 MG tablet TAKE (1) TABLET BY MOUTH AT BEDTIME. 30 tablet 0   Multiple Vitamin (MULITIVITAMIN WITH MINERALS) TABS Take 1 tablet by mouth daily.     nabumetone (RELAFEN) 500 MG tablet TAKE (1) TABLET BY MOUTH EVERY EIGHT HOURS AS NEEDED FOR JOINT PAIN. 60 tablet 0   nystatin (MYCOSTATIN/NYSTOP) powder Apply 1 application topically 3 (three) times daily. 15 g 2   omeprazole (PRILOSEC) 40 MG capsule Take 1 capsule (40 mg total) by mouth daily. 90 capsule 1   oxybutynin (DITROPAN-XL) 10 MG 24 hr tablet Take 1 tablet (10 mg total) by mouth at bedtime. 90 tablet 1   Potassium 99 MG TABS Take 99 mg by mouth daily.      traZODone (DESYREL) 100 MG tablet Take 2 tablets (200 mg total) by mouth at bedtime. 60 tablet 2   venlafaxine XR (EFFEXOR XR) 150 MG 24 hr capsule Take 1 capsule (150 mg total) by mouth daily with breakfast. 90 capsule 2   No current facility-administered medications for this visit.     Musculoskeletal: Strength & Muscle Tone: within normal limits Gait & Station: normal Patient leans: N/A  Psychiatric Specialty Exam: Review of Systems  Musculoskeletal:  Positive for arthralgias and joint swelling.  All other systems reviewed  and are negative.  There were no vitals taken for this visit.There is no height or weight on file to calculate BMI.  General Appearance: NA  Eye Contact:  NA  Speech:  Clear and Coherent  Volume:  Normal  Mood:  Euthymic  Affect:  NA  Thought Process:  Goal Directed  Orientation:  Full (Time, Place, and Person)  Thought Content: WDL   Suicidal Thoughts:  No  Homicidal Thoughts:  No  Memory:  Immediate;   Good Recent;   Good Remote;   Fair  Judgement:  Good  Insight:  Good  Psychomotor Activity:  Normal  Concentration:  Concentration: Good and Attention Span: Good  Recall:  Good  Fund of Knowledge: Good  Language: Good  Akathisia:  No  Handed:  Right  AIMS (if indicated): not done  Assets:  Communication Skills Desire for Improvement Resilience Social Support Talents/Skills  ADL's:  Intact  Cognition: WNL  Sleep:  Good   Screenings: AUDIT    Flowsheet Row Clinical Support from 01/06/2021 in [facility]  Alcohol Use Disorder Identification Test Final Score (AUDIT) 4      MDI    Flowsheet Row [facility]2016 [facility]  Total Score (max 50) 34      Mini-Mental    Flowsheet Row Office Visit from 02/13/2015 in [facility]  Total Score (max 30 points ) 26      PHQ2-9    Flowsheet Row Video Visit from 01/29/2021 in [place] Office Visit from 01/28/2021 in [facility] Clinical Support from 01/06/2021 in [facility] Office Visit from 10/27/2020 in [facility] Video Visit from 10/06/2020 in [facility]  PHQ-2 Total Score 0 0 0 0 0  PHQ-9 Total Score -- 0 -- -- 0      SBQ-R    [place] Office Visit from 01/22/2016 in [facility]  SBQ-R Total Score 14.1      Flowsheet Row Video Visit from 01/29/2021 in [facility] Video Visit from 10/06/2020 in [facility] No Risk No Risk        Assessment and Plan: This patient is a 60 year old female with a history of depression anxiety and problems with focus and energy.  She is doing well on her current regimen.  She will continue Adderall 20 mg twice daily for focus and alertness, trazodone 200 mg at bedtime for sleep, Effexor XR 150 mg daily for depression and Xanax 1 mg 3 times daily for anxiety.  She will return to see me in 3 months   [name], MD 01/29/2021, 11:48 AM

## 2021-01-31 DIAGNOSIS — K760 Fatty (change of) liver, not elsewhere classified: Secondary | ICD-10-CM | POA: Insufficient documentation

## 2021-01-31 DIAGNOSIS — M1990 Unspecified osteoarthritis, unspecified site: Secondary | ICD-10-CM | POA: Insufficient documentation

## 2021-01-31 NOTE — Assessment & Plan Note (Signed)
BP Readings from Last 1 Encounters:  01/28/21 115/74   Well-controlled with Lisinoptil Counseled for compliance with the medications Advised DASH diet and moderate exercise/walking, at least 150 mins/week

## 2021-01-31 NOTE — Assessment & Plan Note (Signed)
Uses Nystatin powder

## 2021-01-31 NOTE — Progress Notes (Signed)
Established Patient Office Visit  Subjective:  Patient ID: [name], female    DOB: [birth year]  Age: 60 y.o. MRN: [id number]  CC:  Chief Complaint  Patient presents with   Medication Problem    Pt would like to discuss bp meds has been keeping bp  record at home she has not been taking amlodipine and she also thinks she doesn't need lisinopril twice a day she also wants to discuss nabumetone as she takes this twice a day and want to find something to take three times a day also pt wants you to take over prescription of nystatin powder     HPI [name] presents for discussion of her medication regimen and c/o perineal area itching.  HTN: Her blood pressure has been well controlled at home.  She has stopped taking amlodipine.  She has been taking lisinopril twice daily.  She asked about HCTZ, which she used to take in the past.  After discussion, she agrees to continue the current regimen for now and stop taking amlodipine.  She denies any headache, dizziness, chest pain or palpitations.  Arthritis: She has been taking Nabumetone and asks for any medication which can be taken TID. She agrees to take Tylenol intermittently.  She asks for Nystatin powder, which she uses for perineal area itching.  Past Medical History:  Diagnosis Date   Allergy    grass, dust , mold   Anxiety    Arthritis    Asthma due to seasonal allergies 06/15/2020   Bipolar disorder ([place])    Cancer ([place])    cervical cancer   Carpal tunnel syndrome    Bilateral   Chest pain 09/2011   Cardiac cath-normal coronaries   Constipation    Depression    Difficulty urinating 05/31/2013   Elevated LFTs 12/16/2013   GERD (gastroesophageal reflux disease)    History of kidney stones    Hyperlipemia    Hyperlipidemia    Hypertension    Mild; provoked by stress and anxiety   IBS (irritable bowel syndrome)    Intracerebral bleed ([place])    No aneurysm; followed by Dr. Sherwood Gambler   Loss of weight 01/06/2015    Osteoporosis    Stroke ([place]) 1999   hemorrhagic stroke; weakness of left side    Past Surgical History:  Procedure Laterality Date   ABDOMINAL HYSTERECTOMY     "cancer cells"   [place]   to remove blood clot after stroke    CARDIAC CATHETERIZATION  2016   CERVICAL FUSION     CHOLECYSTECTOMY N/A 10/14/2014   Procedure: LAPAROSCOPIC CHOLECYSTECTOMY WITH INTRAOPERATIVE CHOLANGIOGRAM;  Surgeon: Jackolyn Confer, MD;  Location: [facility];  Service: General;  Laterality: N/A;   CHONDROPLASTY Right 07/13/2017   Procedure: CHONDROPLASTY of patella;  Surgeon: Carole Civil, MD;  [facility];  Service: Orthopedics;  Laterality: Right;   EUS N/A 08/21/2015   Procedure: ESOPHAGEAL ENDOSCOPIC ULTRASOUND (EUS) RADIAL;  Surgeon: Carol Ada, MD;  [facility];  Service: Endoscopy;  Laterality: N/A;   KNEE ARTHROSCOPY WITH MEDIAL MENISECTOMY Right 07/13/2017   Procedure: KNEE ARTHROSCOPY WITH PARTIAL MEDIAL MENISECTOMY;  Surgeon: Carole Civil, MD;  [facility];  Service: Orthopedics;  Laterality: Right;   LEFT HEART CATHETERIZATION WITH CORONARY ANGIOGRAM N/A 09/23/2011   Procedure: LEFT HEART CATHETERIZATION WITH CORONARY ANGIOGRAM;  Surgeon: Thayer Headings, MD;  Location[facility];  Service: Cardiovascular;  Laterality: N/A;   LIPOMA EXCISION Left 11/18/2013  Procedure: EXCISION OF SOFT TISSUE MASS-LEFT THIGH;  Surgeon: Jamesetta So, MD;  Location: [facility];  Service: General;  Laterality: Left;   RECTOCELE REPAIR     x2   RECTOCELE REPAIR N/A 04/04/2017   Procedure: POSTERIOR REPAIR (RECTOCELE);  Surgeon: Jonnie Kind, MD;  [facility];  Service: Gynecology;  Laterality: N/A;    Family History  Problem Relation Age of Onset   Cancer Mother        breast    Hypertension Mother    Hyperlipidemia Mother    Depression Mother    Anxiety disorder Mother    COPD Mother    Arthritis Mother        rheumatoid   Drug abuse Sister    Coronary artery  disease Paternal Grandfather    Coronary artery disease Paternal Uncle    Depression Cousin    Drug abuse Cousin     Social History   Socioeconomic History   Marital status: Married    Spouse name: Sonia Side   Number of children: 0   Years of education: HS   Highest education level: Not on file  Occupational History   Occupation: unemployed    Comment: pending disability  Tobacco Use   Smoking status: Former    Packs/day: 1.00    Years: 19.00    Pack years: 19.00    Types: Cigarettes    Quit date: 09/01/1997    Years since quitting: 23.4   Smokeless tobacco: Never   Tobacco comments:    Quit smoking 1999 , previous 20 pack years  Vaping Use   Vaping Use: Never used  Substance and Sexual Activity   Alcohol use: Yes    Comment: 1 drink every other week   Drug use: No   Sexual activity: Not Currently    Partners: Male    Birth control/protection: Surgical    Comment: hyst   Other Topics Concern   Not on file  Social History Narrative   Currently unable to work   Lives in [place]   Married   Patient drinks 1 cup of caffeine daily.   Patient is right handed.    Joined the [facility] to get more exercise   Social Determinants of Health   Financial Resource Strain: Low Risk    Difficulty of Paying Living Expenses: Not hard at all  Food Insecurity: No Food Insecurity   Worried About Charity fundraiser in the Last Year: Never true   Arboriculturist in the Last Year: Never true  Transportation Needs: No Transportation Needs   Lack of Transportation (Medical): No   Lack of Transportation (Non-Medical): No  Physical Activity: Inactive   Days of Exercise per Week: 0 days   Minutes of Exercise per Session: 0 min  Stress: No Stress Concern Present   Feeling of Stress : Not at all  Social Connections: Moderately Isolated   Frequency of Communication with Friends and Family: More than three times a week   Frequency of Social Gatherings with Friends and Family: More than three  times a week   Attends Religious Services: Never   Marine scientist or Organizations: No   Attends Music therapist: Never   Marital Status: Married  Human resources officer Violence: Not At Risk   Fear of Current or Ex-Partner: No   Emotionally Abused: No   Physically Abused: No   Sexually Abused: No    Outpatient Medications Prior to Visit  Medication Sig Dispense  Refill   acetaminophen (TYLENOL) 500 MG tablet Take 1,000 mg by mouth every 6 (six) hours as needed for moderate pain.      atorvastatin (LIPITOR) 20 MG tablet TAKE ONE TABLET BY MOUTH ONCE DAILY. 90 tablet 0   B Complex-C (SUPER B COMPLEX PO) Take 1 tablet by mouth daily.      docusate sodium (COLACE) 100 MG capsule Take 100 mg by mouth daily.     fluticasone (FLOVENT HFA) 44 MCG/ACT inhaler Inhale 2 puffs into the lungs in the morning and at bedtime. 1 each 12   lisinopril (ZESTRIL) 20 MG tablet Take 1 tablet (20 mg total) by mouth 2 (two) times daily. 180 tablet 1   lubiprostone (AMITIZA) 8 MCG capsule TAKE 1 CAPSULE TWICE DAILY WITH MEALS. 60 capsule 0   montelukast (SINGULAIR) 10 MG tablet TAKE (1) TABLET BY MOUTH AT BEDTIME. 30 tablet 0   Multiple Vitamin (MULITIVITAMIN WITH MINERALS) TABS Take 1 tablet by mouth daily.     nabumetone (RELAFEN) 500 MG tablet TAKE (1) TABLET BY MOUTH EVERY EIGHT HOURS AS NEEDED FOR JOINT PAIN. 60 tablet 0   omeprazole (PRILOSEC) 40 MG capsule Take 1 capsule (40 mg total) by mouth daily. 90 capsule 1   oxybutynin (DITROPAN-XL) 10 MG 24 hr tablet Take 1 tablet (10 mg total) by mouth at bedtime. 90 tablet 1   Potassium 99 MG TABS Take 99 mg by mouth daily.      ALPRAZolam (XANAX) 1 MG tablet Take 1 tablet (1 mg total) by mouth 3 (three) times daily. 90 tablet 2   amLODipine (NORVASC) 5 MG tablet TAKE ONE TABLET BY MOUTH ONCE DAILY. 90 tablet 0   amphetamine-dextroamphetamine (ADDERALL) 20 MG tablet Take 1 tablet (20 mg total) by mouth 2 (two) times daily. 60 tablet 0    amphetamine-dextroamphetamine (ADDERALL) 20 MG tablet Take 1 tablet (20 mg total) by mouth 2 (two) times daily. 30 tablet 0   traZODone (DESYREL) 100 MG tablet Take 2 tablets (200 mg total) by mouth at bedtime. 60 tablet 2   venlafaxine XR (EFFEXOR XR) 150 MG 24 hr capsule Take 1 capsule (150 mg total) by mouth daily with breakfast. 90 capsule 2   azelastine (ASTELIN) 0.1 % nasal spray 2 sprays each nostril 1-2 times daily as needed (Patient not taking: No sig reported) 30 mL 5   ciclesonide (ALVESCO) 80 MCG/ACT inhaler Inhale 1 puff into the lungs 2 (two) times daily. (Patient not taking: No sig reported) 1 each 1   diclofenac sodium (VOLTAREN) 1 % GEL Apply 2 g topically 4 (four) times daily. (Patient not taking: No sig reported) 100 g 0   levalbuterol (XOPENEX HFA) 45 MCG/ACT inhaler Inhale 2 puffs into the lungs every 4 (four) hours as needed for wheezing. (Patient not taking: No sig reported) 15 g 0   No facility-administered medications prior to visit.    Allergies  Allergen Reactions   Morphine And Related Hives   Promethazine Hcl Other (See Comments)    Causes patient to become Hyper    ROS Review of Systems  Constitutional:  Negative for chills and fever.  HENT:  Negative for congestion, sinus pressure, sinus pain and sore throat.   Eyes:  Negative for pain and discharge.  Respiratory:  Negative for cough and shortness of breath.   Cardiovascular:  Negative for chest pain and palpitations.  Gastrointestinal:  Positive for constipation. Negative for abdominal pain, diarrhea, nausea and vomiting.  Endocrine: Negative for polydipsia and polyuria.  Genitourinary:  Negative for dysuria and hematuria.  Musculoskeletal:  Negative for neck pain and neck stiffness.  Skin:  Negative for rash.  Allergic/Immunologic: Positive for environmental allergies.  Neurological:  Negative for dizziness and weakness.  Psychiatric/Behavioral:  Negative for agitation and behavioral problems.       Objective:    Physical Exam Vitals reviewed.  Constitutional:      General: She is not in acute distress.    Appearance: She is not diaphoretic.  HENT:     Head: Normocephalic and atraumatic.     Nose: Nose normal. No congestion.     Mouth/Throat:     Mouth: Mucous membranes are moist.     Pharynx: No posterior oropharyngeal erythema.  Eyes:     General: No scleral icterus.    Extraocular Movements: Extraocular movements intact.  Cardiovascular:     Rate and Rhythm: Normal rate and regular rhythm.     Pulses: Normal pulses.     Heart sounds: Normal heart sounds. No murmur heard. Pulmonary:     Breath sounds: Normal breath sounds. No wheezing or rales.  Musculoskeletal:     Cervical back: Neck supple. No tenderness.     Right lower leg: No edema.     Left lower leg: No edema.  Skin:    General: Skin is warm.     Findings: No rash.  Neurological:     General: No focal deficit present.     Mental Status: She is alert and oriented to person, place, and time.  Psychiatric:        Mood and Affect: Mood normal.        Behavior: Behavior normal.    BP 115/74 (BP Location: Right Arm, Patient Position: Sitting, Cuff Size: Normal)   Pulse 76   Temp 98.2 F (36.8 C) (Oral)   Resp 18   Ht 5' 3"  (1.6 m)   Wt 142 lb (64.4 kg)   SpO2 96%   BMI 25.15 kg/m  Wt Readings from Last 3 Encounters:  01/28/21 142 lb (64.4 kg)  01/06/21 145 lb 12 oz (66.1 kg)  10/27/20 149 lb 1.9 oz (67.6 kg)     Health Maintenance Due  Topic Date Due   Hepatitis C Screening  Never done   Zoster Vaccines- Shingrix (1 of 2) Never done   COLONOSCOPY (Pts 45-34yr Insurance coverage will need to be confirmed)  10/12/2015   COVID-19 Vaccine (4 - Booster for Pfizer series) 12/10/2020    There are no preventive care reminders to display for this patient.  Lab Results  Component Value Date   TSH 1.770 08/31/2020   Lab Results  Component Value Date   WBC 5.7 08/31/2020   HGB 13.5 08/31/2020    HCT 39.9 08/31/2020   MCV 97 08/31/2020   PLT 289 08/31/2020   Lab Results  Component Value Date   NA 143 01/29/2021   K 5.0 01/29/2021   CO2 25 01/29/2021   GLUCOSE 101 (H) 01/29/2021   BUN 18 01/29/2021   CREATININE 1.17 (H) 01/29/2021   BILITOT 0.6 01/29/2021   ALKPHOS 69 01/29/2021   AST 27 01/29/2021   ALT 48 (H) 01/29/2021   PROT 7.0 01/29/2021   ALBUMIN 4.6 01/29/2021   CALCIUM 9.7 01/29/2021   ANIONGAP 11 07/10/2017   EGFR 54 (L) 01/29/2021   Lab Results  Component Value Date   CHOL 210 (H) 08/31/2020   Lab Results  Component Value Date   HDL 65 08/31/2020   Lab  Results  Component Value Date   LDLCALC 105 (H) 08/31/2020   Lab Results  Component Value Date   TRIG 235 (H) 08/31/2020   Lab Results  Component Value Date   CHOLHDL 3.2 08/31/2020   Lab Results  Component Value Date   HGBA1C 5.3 06/02/2017      Assessment & Plan:   Problem List Items Addressed This Visit       Cardiovascular and Mediastinum   Hypertension    BP Readings from Last 1 Encounters:  01/28/21 115/74  Well-controlled with Lisinoptil Counseled for compliance with the medications Advised DASH diet and moderate exercise/walking, at least 150 mins/week         Digestive   Fatty liver    H/o NAFLD Weight maintenance and avoid hepatotoxic agents Check CMP       Relevant Orders   CMP14+EGFR     Musculoskeletal and Integument   Vaginal itching    Uses Nystatin powder       Relevant Medications   nystatin (MYCOSTATIN/NYSTOP) powder   Arthritis - Primary    C/o morning stiffness, concerned about RA Check RF, anti-CCP and anti-dsDNA for now Continue Nabumetone and Tylenol PRN       Relevant Orders   ANA Direct w/Reflex if Positive (Completed)   Rheumatoid Factor (Completed)   CYCLIC CITRUL PEPTIDE ANTIBODY, IGG/IGA (Completed)   Anti-DNA antibody, double-stranded (Completed)    Meds ordered this encounter  Medications   nystatin (MYCOSTATIN/NYSTOP)  powder    Sig: Apply 1 application topically 3 (three) times daily.    Dispense:  15 g    Refill:  2    Follow-up: Return if symptoms worsen or fail to improve.    Lindell Spar, MD

## 2021-01-31 NOTE — Assessment & Plan Note (Signed)
C/o morning stiffness, concerned about RA Check RF, anti-CCP and anti-dsDNA for now Continue Nabumetone and Tylenol PRN

## 2021-01-31 NOTE — Assessment & Plan Note (Signed)
H/o NAFLD Weight maintenance and avoid hepatotoxic agents Check CMP

## 2021-02-01 ENCOUNTER — Encounter: Payer: Self-pay | Admitting: Internal Medicine

## 2021-02-02 ENCOUNTER — Encounter: Payer: Self-pay | Admitting: Internal Medicine

## 2021-02-02 LAB — CMP14+EGFR
ALT: 48 IU/L — ABNORMAL HIGH (ref 0–32)
AST: 27 IU/L (ref 0–40)
Albumin/Globulin Ratio: 1.9 (ref 1.2–2.2)
Albumin: 4.6 g/dL (ref 3.8–4.9)
Alkaline Phosphatase: 69 IU/L (ref 44–121)
BUN/Creatinine Ratio: 15 (ref 9–23)
BUN: 18 mg/dL (ref 6–24)
Bilirubin Total: 0.6 mg/dL (ref 0.0–1.2)
CO2: 25 mmol/L (ref 20–29)
Calcium: 9.7 mg/dL (ref 8.7–10.2)
Chloride: 103 mmol/L (ref 96–106)
Creatinine, Ser: 1.17 mg/dL — ABNORMAL HIGH (ref 0.57–1.00)
Globulin, Total: 2.4 g/dL (ref 1.5–4.5)
Glucose: 101 mg/dL — ABNORMAL HIGH (ref 65–99)
Potassium: 5 mmol/L (ref 3.5–5.2)
Sodium: 143 mmol/L (ref 134–144)
Total Protein: 7 g/dL (ref 6.0–8.5)
eGFR: 54 mL/min/{1.73_m2} — ABNORMAL LOW (ref >59)

## 2021-02-02 LAB — ANTI-DNA ANTIBODY, DOUBLE-STRANDED: dsDNA Ab: 1 IU/mL (ref 0–9)

## 2021-02-02 LAB — ANA W/REFLEX IF POSITIVE: Anti Nuclear Antibody (ANA): NEGATIVE

## 2021-02-02 LAB — CYCLIC CITRUL PEPTIDE ANTIBODY, IGG/IGA: Cyclic Citrullin Peptide Ab: 7 units (ref 0–19)

## 2021-02-02 LAB — RHEUMATOID FACTOR: Rheumatoid fact SerPl-aCnc: <10 IU/mL (ref <14.0)

## 2021-02-02 NOTE — Telephone Encounter (Signed)
Pt advised of lab results via telephone with verbal understanding

## 2021-02-08 ENCOUNTER — Ambulatory Visit (HOSPITAL_COMMUNITY)
Admission: RE | Admit: 2021-02-08 | Discharge: 2021-02-08 | Disposition: A | Discharge status: Home / Self Care | Payer: PPO | Source: Ambulatory Visit | Attending: Internal Medicine | Admitting: Internal Medicine

## 2021-02-08 ENCOUNTER — Other Ambulatory Visit: Payer: Self-pay

## 2021-02-08 DIAGNOSIS — Z1231 Encounter for screening mammogram for malignant neoplasm of breast: Secondary | ICD-10-CM | POA: Insufficient documentation

## 2021-02-12 ENCOUNTER — Other Ambulatory Visit: Payer: Self-pay | Admitting: Family Medicine

## 2021-02-12 ENCOUNTER — Telehealth: Payer: Self-pay | Admitting: Orthopedic Surgery

## 2021-02-12 DIAGNOSIS — J45909 Unspecified asthma, uncomplicated: Secondary | ICD-10-CM

## 2021-02-12 NOTE — Telephone Encounter (Signed)
Patient called left voicemail wanting to know if Dr. Aline Brochure can give her a shot in her hand.  Please call her back and advise

## 2021-02-15 ENCOUNTER — Encounter: Payer: Self-pay | Admitting: Internal Medicine

## 2021-02-16 ENCOUNTER — Ambulatory Visit (INDEPENDENT_AMBULATORY_CARE_PROVIDER_SITE_OTHER): Payer: PPO | Admitting: Nurse Practitioner

## 2021-02-16 ENCOUNTER — Other Ambulatory Visit: Payer: Self-pay

## 2021-02-16 ENCOUNTER — Encounter: Payer: Self-pay | Admitting: Nurse Practitioner

## 2021-02-16 DIAGNOSIS — I10 Essential (primary) hypertension: Secondary | ICD-10-CM | POA: Diagnosis not present

## 2021-02-16 NOTE — Progress Notes (Signed)
Acute Office Visit  Subjective:    Patient ID: [name], female    DOB: 07-Oct-1960, 60 y.o.   MRN: [id number]  Chief Complaint  Patient presents with   Hypertension    Chronic b/p, has been concerned for about a year about it being more elevated than it has been especially here recent.     HPI Patient is in today for BP check. She currently takes lisinopril.  Past Medical History:  Diagnosis Date   Allergy    grass, dust , mold   Anxiety    Arthritis    Asthma due to seasonal allergies 06/15/2020   Bipolar disorder ([place])    Cancer ([place])    cervical cancer   Carpal tunnel syndrome    Bilateral   Chest pain 09/2011   Cardiac cath-normal coronaries   Constipation    Depression    Difficulty urinating 05/31/2013   Elevated LFTs 12/16/2013   GERD (gastroesophageal reflux disease)    History of kidney stones    Hyperlipemia    Hyperlipidemia    Hypertension    Mild; provoked by stress and anxiety   IBS (irritable bowel syndrome)    Intracerebral bleed ([place])    No aneurysm; followed by Dr. Sherwood Gambler   Loss of weight 01/06/2015   Osteoporosis    Stroke ([place]) 1999   hemorrhagic stroke; weakness of left side    Past Surgical History:  Procedure Laterality Date   ABDOMINAL HYSTERECTOMY     "cancer cells"   [place]   to remove blood clot after stroke    CARDIAC CATHETERIZATION  2016   CERVICAL FUSION     CHOLECYSTECTOMY N/A 10/14/2014   Procedure: LAPAROSCOPIC CHOLECYSTECTOMY WITH INTRAOPERATIVE CHOLANGIOGRAM;  Surgeon: Jackolyn Confer, MD;  Location: [facility];  Service: General;  Laterality: N/A;   CHONDROPLASTY Right 07/13/2017   Procedure: CHONDROPLASTY of patella;  Surgeon: Carole Civil, MD;  [facility];  Service: Orthopedics;  Laterality: Right;   EUS N/A 08/21/2015   Procedure: ESOPHAGEAL ENDOSCOPIC ULTRASOUND (EUS) RADIAL;  Surgeon: Carol Ada, MD;  [facility];  Service: Endoscopy;  Laterality: N/A;   KNEE ARTHROSCOPY WITH  MEDIAL MENISECTOMY Right 07/13/2017   Procedure: KNEE ARTHROSCOPY WITH PARTIAL MEDIAL MENISECTOMY;  Surgeon: Carole Civil, MD;  [facility];  Service: Orthopedics;  Laterality: Right;   LEFT HEART CATHETERIZATION WITH CORONARY ANGIOGRAM N/A 09/23/2011   Procedure: LEFT HEART CATHETERIZATION WITH CORONARY ANGIOGRAM;  Surgeon: Thayer Headings, MD;  Location: [facility];  Service: Cardiovascular;  Laterality: N/A;   LIPOMA EXCISION Left 11/18/2013   Procedure: EXCISION OF SOFT TISSUE MASS-LEFT THIGH;  Surgeon: Jamesetta So, MD;  Location: [facility];  Service: General;  Laterality: Left;   RECTOCELE REPAIR     x2   RECTOCELE REPAIR N/A 04/04/2017   Procedure: POSTERIOR REPAIR (RECTOCELE);  Surgeon: Jonnie Kind, MD;  [facility];  Service: Gynecology;  Laterality: N/A;    Family History  Problem Relation Age of Onset   Cancer Mother        breast    Hypertension Mother    Hyperlipidemia Mother    Depression Mother    Anxiety disorder Mother    COPD Mother    Arthritis Mother        rheumatoid   Drug abuse Sister    Coronary artery disease Paternal Grandfather    Coronary artery disease Paternal Uncle    Depression Cousin    Drug  abuse Cousin     Social History   Socioeconomic History   Marital status: Married    Spouse name: Sonia Side   Number of children: 0   Years of education: HS   Highest education level: Not on file  Occupational History   Occupation: unemployed    Comment: pending disability  Tobacco Use   Smoking status: Former    Packs/day: 1.00    Years: 19.00    Pack years: 19.00    Types: Cigarettes    Quit date: 09/01/1997    Years since quitting: 23.4   Smokeless tobacco: Never   Tobacco comments:    Quit smoking 1999 , previous 20 pack years  Vaping Use   Vaping Use: Never used  Substance and Sexual Activity   Alcohol use: Yes    Comment: 1 drink every other week   Drug use: No   Sexual activity: Not Currently    Partners: Male    Birth  control/protection: Surgical    Comment: hyst   Other Topics Concern   Not on file  Social History Narrative   Currently unable to work   Lives in [place]   Married   Patient drinks 1 cup of caffeine daily.   Patient is right handed.    Joined the [employer] to get more exercise   Social Determinants of Health   Financial Resource Strain: Low Risk    Difficulty of Paying Living Expenses: Not hard at all  Food Insecurity: No Food Insecurity   Worried About Charity fundraiser in the Last Year: Never true   Arboriculturist in the Last Year: Never true  Transportation Needs: No Transportation Needs   Lack of Transportation (Medical): No   Lack of Transportation (Non-Medical): No  Physical Activity: Inactive   Days of Exercise per Week: 0 days   Minutes of Exercise per Session: 0 min  Stress: No Stress Concern Present   Feeling of Stress : Not at all  Social Connections: Moderately Isolated   Frequency of Communication with Friends and Family: More than three times a week   Frequency of Social Gatherings with Friends and Family: More than three times a week   Attends Religious Services: Never   Marine scientist or Organizations: No   Attends Music therapist: Never   Marital Status: Married  Human resources officer Violence: Not At Risk   Fear of Current or Ex-Partner: No   Emotionally Abused: No   Physically Abused: No   Sexually Abused: No    Outpatient Medications Prior to Visit  Medication Sig Dispense Refill   acetaminophen (TYLENOL) 500 MG tablet Take 1,000 mg by mouth every 6 (six) hours as needed for moderate pain.      ALPRAZolam (XANAX) 1 MG tablet Take 1 tablet (1 mg total) by mouth 3 (three) times daily. 90 tablet 2   amphetamine-dextroamphetamine (ADDERALL) 20 MG tablet Take 1 tablet (20 mg total) by mouth 2 (two) times daily. Fill after 02/28/2021 90 tablet 0   atorvastatin (LIPITOR) 20 MG tablet TAKE ONE TABLET BY MOUTH ONCE DAILY. 90 tablet 0   B  Complex-C (SUPER B COMPLEX PO) Take 1 tablet by mouth daily.      docusate sodium (COLACE) 100 MG capsule Take 100 mg by mouth daily.     fluticasone (FLOVENT HFA) 44 MCG/ACT inhaler Inhale 2 puffs into the lungs in the morning and at bedtime. 1 each 12   lisinopril (ZESTRIL) 20 MG tablet  Take 1 tablet (20 mg total) by mouth 2 (two) times daily. 180 tablet 1   lubiprostone (AMITIZA) 8 MCG capsule TAKE 1 CAPSULE TWICE DAILY WITH MEALS. 60 capsule 0   montelukast (SINGULAIR) 10 MG tablet TAKE (1) TABLET BY MOUTH AT BEDTIME. 30 tablet 0   Multiple Vitamin (MULITIVITAMIN WITH MINERALS) TABS Take 1 tablet by mouth daily.     nabumetone (RELAFEN) 500 MG tablet TAKE (1) TABLET BY MOUTH EVERY EIGHT HOURS AS NEEDED FOR JOINT PAIN. 60 tablet 3   nystatin (MYCOSTATIN/NYSTOP) powder Apply 1 application topically 3 (three) times daily. 15 g 2   omeprazole (PRILOSEC) 40 MG capsule Take 1 capsule (40 mg total) by mouth daily. 90 capsule 1   oxybutynin (DITROPAN-XL) 10 MG 24 hr tablet Take 1 tablet (10 mg total) by mouth at bedtime. 90 tablet 1   Potassium 99 MG TABS Take 99 mg by mouth daily.      traZODone (DESYREL) 100 MG tablet Take 2 tablets (200 mg total) by mouth at bedtime. 60 tablet 2   venlafaxine XR (EFFEXOR XR) 150 MG 24 hr capsule Take 1 capsule (150 mg total) by mouth daily with breakfast. 90 capsule 2   amphetamine-dextroamphetamine (ADDERALL) 20 MG tablet Take 1 tablet (20 mg total) by mouth 2 (two) times daily. 60 tablet 0   amphetamine-dextroamphetamine (ADDERALL) 20 MG tablet Take 1 tablet (20 mg total) by mouth 2 (two) times daily. 30 tablet 0   No facility-administered medications prior to visit.    Allergies  Allergen Reactions   Morphine And Related Hives   Promethazine Hcl Other (See Comments)    Causes patient to become Hyper    Review of Systems     Objective:    Physical Exam  BP 137/77 (BP Location: Right Arm, Patient Position: Sitting, Cuff Size: Large)   Pulse 70    Temp 98.4 F (36.9 C) (Oral)   Ht 5' 3" (1.6 m)   Wt 140 lb (63.5 kg)   SpO2 95%   BMI 24.80 kg/m  Wt Readings from Last 3 Encounters:  02/16/21 140 lb (63.5 kg)  01/28/21 142 lb (64.4 kg)  01/06/21 145 lb 12 oz (66.1 kg)    Health Maintenance Due  Topic Date Due   Hepatitis C Screening  Never done   Zoster Vaccines- Shingrix (1 of 2) Never done   INFLUENZA VACCINE  02/01/2021    There are no preventive care reminders to display for this patient.   Lab Results  Component Value Date   TSH 1.770 08/31/2020   Lab Results  Component Value Date   WBC 5.7 08/31/2020   HGB 13.5 08/31/2020   HCT 39.9 08/31/2020   MCV 97 08/31/2020   PLT 289 08/31/2020   Lab Results  Component Value Date   NA 143 01/29/2021   K 5.0 01/29/2021   CO2 25 01/29/2021   GLUCOSE 101 (H) 01/29/2021   BUN 18 01/29/2021   CREATININE 1.17 (H) 01/29/2021   BILITOT 0.6 01/29/2021   ALKPHOS 69 01/29/2021   AST 27 01/29/2021   ALT 48 (H) 01/29/2021   PROT 7.0 01/29/2021   ALBUMIN 4.6 01/29/2021   CALCIUM 9.7 01/29/2021   ANIONGAP 11 07/10/2017   EGFR 54 (L) 01/29/2021   Lab Results  Component Value Date   CHOL 210 (H) 08/31/2020   Lab Results  Component Value Date   HDL 65 08/31/2020   Lab Results  Component Value Date   LDLCALC 105 (H) 08/31/2020  Lab Results  Component Value Date   TRIG 235 (H) 08/31/2020   Lab Results  Component Value Date   CHOLHDL 3.2 08/31/2020   Lab Results  Component Value Date   HGBA1C 5.3 06/02/2017       Assessment & Plan:   Problem List Items Addressed This Visit       Cardiovascular and Mediastinum   Hypertension    BP Readings from Last 3 Encounters:  02/16/21 137/77  01/28/21 115/74  01/06/21 119/77  -her SBPs usually run in the 110-120s, but for the last 3-4 days they have been in the 130-140s -she takes old amlodipine 5 mg when her SBP is > 140 -she has been taking nasacort for the last few days and had headaches; may be related to  that medication -discussed possibly adding 2.5 mg of amlodipine daily, since she had been adding on a 5 mg dose PRN, but she would prefer to swap to flonase to see if that has an affect on her BP -she has flonase at home already, so no need for Rx.        No orders of the defined types were placed in this encounter.    Noreene Larsson, NP

## 2021-02-16 NOTE — Telephone Encounter (Signed)
Added as a work in for [employer] @ 3:40

## 2021-02-16 NOTE — Assessment & Plan Note (Addendum)
BP Readings from Last 3 Encounters:  02/16/21 137/77  01/28/21 115/74  01/06/21 119/77   -her SBPs usually run in the 110-120s, but for the last 3-4 days they have been in the 130-140s -she takes old amlodipine 5 mg when her SBP is > 140 -she has been taking nasacort for the last few days and had headaches; may be related to that medication -discussed possibly adding 2.5 mg of amlodipine daily, since she had been adding on a 5 mg dose PRN, but she would prefer to swap to flonase to see if that has an affect on her BP -she has flonase at home already, so no need for Rx.

## 2021-02-22 ENCOUNTER — Ambulatory Visit: Payer: PPO

## 2021-02-22 ENCOUNTER — Encounter: Payer: Self-pay | Admitting: Orthopedic Surgery

## 2021-02-22 ENCOUNTER — Ambulatory Visit: Payer: PPO | Admitting: Orthopedic Surgery

## 2021-02-22 ENCOUNTER — Other Ambulatory Visit: Payer: Self-pay

## 2021-02-22 VITALS — BP 137/87 | HR 85 | Ht 63.0 in | Wt 140.0 lb

## 2021-02-22 DIAGNOSIS — G5603 Carpal tunnel syndrome, bilateral upper limbs: Secondary | ICD-10-CM | POA: Diagnosis not present

## 2021-02-22 DIAGNOSIS — M79641 Pain in right hand: Secondary | ICD-10-CM

## 2021-02-22 DIAGNOSIS — M79642 Pain in left hand: Secondary | ICD-10-CM | POA: Diagnosis not present

## 2021-02-22 MED ORDER — MELOXICAM 7.5 MG PO TABS: 7.5000 mg | ORAL_TABLET | Freq: Every day | ORAL | 5 refills | Status: DC

## 2021-02-22 NOTE — Progress Notes (Signed)
Chief Complaint  Patient presents with   Hand Pain    States hands painful, all of joints in hands right hand is worse and morning stiffness for several months    This is a 60 year old female presents with 66-monthhistory of pain in her right thumb and electric sensation shooting into her fingers associated with waking up in the morning with the hands feeling tight difficulty closing the fist.  Although symptoms are in both upper extremities the right hand is worse than the left and the right thumb seems to have pain in the base of the joint at the [facility].  Review of systems tiredness ringing of the ears light sensitivity joint pain depression nervousness no sensory changes just the electric shooting pain   Past Medical History:  Diagnosis Date   Allergy    grass, dust , mold   Anxiety    Arthritis    Asthma due to seasonal allergies 06/15/2020   Bipolar disorder ([place]    [place]    cervical cancer   Carpal tunnel syndrome    Bilateral   Chest pain 09/2011   Cardiac cath-normal coronaries   Constipation    Depression    Difficulty urinating 05/31/2013   Elevated LFTs 12/16/2013   GERD (gastroesophageal reflux disease)    History of kidney stones    Hyperlipemia    Hyperlipidemia    Hypertension    Mild; provoked by stress and anxiety   IBS (irritable bowel syndrome)    Intracerebral bleed ([place]    No aneurysm; followed by Dr. NSherwood Gambler  Loss of weight 01/06/2015   Osteoporosis    Stroke ([place] 1999   hemorrhagic stroke; weakness of left side    BP 137/87   Pulse 85   Ht 5' 3"  (1.6 m)   Wt 140 lb (63.5 kg)   BMI 24.80 kg/m   Appearance was normal  She is alert awake and oriented x3  Mood and affect are normal  She does have tenderness at the base of the right thumb  Range of motion is normal no swelling.  Grip strength seems normal.  Gross sensory exam is normal  ComPression test was negative at a minute but wrist flexion test Phalen's test positive at  50 seconds  Xrays of the hands are normal   Rec:   Test for carpal tunnel change nsaids if shes agreeable   Allergies  Allergen Reactions   Morphine And Related Hives   Promethazine Hcl Other (See Comments)    Causes patient to become Hyper   Encounter Diagnoses  Name Primary?   Bilateral hand pain Yes   Bilateral carpal tunnel syndrome    Meds ordered this encounter  Medications   meloxicam (MOBIC) 7.5 MG tablet    Sig: Take 1 tablet (7.5 mg total) by mouth daily.    Dispense:  30 tablet    Refill:  5   F/u post test virtual   Estab/new problem undiag, low risk test

## 2021-02-22 NOTE — Patient Instructions (Signed)
Driving Directions to [employer] from [employer] address is [place] The phone number is [phone]  Dr Ernestina Patches is the doctor who will do the nerve study.  1. Start out going [place] on [address] toward [address].  Then 0.02 miles0.02 total miles 2. Take the 1st right onto [address]. Continue to follow [address].  If you reach [facility] you've gone a little too far  Then 0.58 miles0.60 total miles 3. Turn right onto [address] is just past [facility]  Then 2.25 miles2.85 total miles 4. Take the [address] ramp toward [place].  Then 0.25 miles3.10 total miles 5. Merge onto [address]  Then 18.17 miles21.28 total miles 6. Merge onto [address]  Then 1.47 miles22.74 total miles 7. Turn right onto [address] is just past [place]  Then 0.11 miles22.85 total miles  8. [address] [place], [place] [address], [place] is on the left.

## 2021-02-24 ENCOUNTER — Telehealth: Payer: Self-pay | Admitting: Radiology

## 2021-02-24 NOTE — Telephone Encounter (Signed)
Patient sent my chart message Meloxicam raising her blood pressure, please advise.

## 2021-02-24 NOTE — Telephone Encounter (Signed)
Patient aware to d/c the Meloxicam, will you send in alternative?

## 2021-02-25 ENCOUNTER — Other Ambulatory Visit: Payer: Self-pay | Admitting: Orthopedic Surgery

## 2021-02-25 MED ORDER — DICLOFENAC POTASSIUM 50 MG PO TABS: 50.0000 mg | ORAL_TABLET | Freq: Two times a day (BID) | ORAL | 3 refills | Status: DC

## 2021-02-25 NOTE — Progress Notes (Signed)
Meds ordered this encounter  Medications   diclofenac (CATAFLAM) 50 MG tablet    Sig: Take 1 tablet (50 mg total) by mouth 2 (two) times daily.    Dispense:  90 tablet    Refill:  3

## 2021-02-27 ENCOUNTER — Encounter: Payer: Self-pay | Admitting: Nurse Practitioner

## 2021-03-01 ENCOUNTER — Ambulatory Visit: Payer: PPO | Admitting: Internal Medicine

## 2021-03-01 NOTE — Telephone Encounter (Signed)
Needs a phone visit, but not today. Need to look at her records and prescribe meds... so that needs a visit.

## 2021-03-01 NOTE — Telephone Encounter (Signed)
You mentioned her taking Amlodipine in your last note, I see nothing about Clonidine. Please advise.

## 2021-03-02 ENCOUNTER — Other Ambulatory Visit: Payer: Self-pay

## 2021-03-02 ENCOUNTER — Ambulatory Visit: Payer: PPO | Admitting: Adult Health

## 2021-03-02 ENCOUNTER — Encounter: Payer: Self-pay | Admitting: Adult Health

## 2021-03-02 VITALS — BP 128/85 | HR 77 | Ht 63.0 in | Wt 141.0 lb

## 2021-03-02 DIAGNOSIS — N644 Mastodynia: Secondary | ICD-10-CM | POA: Diagnosis not present

## 2021-03-02 NOTE — Progress Notes (Signed)
  Subjective:     Patient ID: [name], female   DOB: [birth year], 60 y.o.   MRN: [id number]  HPI [name] is a 60 year old white female, married, sp hysterectomy in complaining of breat pain, has been taking B6 and it has helped, she had normal mammogram 02/08/21, and wants to be fitted for a bra at [facility].  PCP is Dr Posey Pronto  Review of Systems Bilateral breast pain for months Reviewed past medical,surgical, social and family history. Reviewed medications and allergies.     Objective:   Physical Exam BP 128/85 (BP Location: Right Arm, Patient Position: Sitting, Cuff Size: Normal)   Pulse 77   Ht 5' 3"  (1.6 m)   Wt 141 lb (64 kg)   BMI 24.98 kg/m   Skin warm and dry,  Breasts:no dominate palpable mass, retraction or nipple discharge,both breasts are tender OUQ Fall risk is low  [place] - 03/02/21 1629       Pregnancy Intention Screening   Does the patient want to become pregnant in the next year? N/A    Does the patient's partner want to become pregnant in the next year? N/A    Would the patient like to discuss contraceptive options today? N/A      Contraception Wrap Up   Current Method Female Sterilization   hyst   End Method Female Sterilization   hyst   Contraception Counseling Provided No                Assessment:     1. Breast pain Made appt for 9/2 at 2 pm with Tammy at [facility] for bra fitting. [phone]   Continue B6 Plan:     Follow up prn

## 2021-03-06 ENCOUNTER — Other Ambulatory Visit: Payer: Self-pay | Admitting: Internal Medicine

## 2021-03-06 DIAGNOSIS — K5900 Constipation, unspecified: Secondary | ICD-10-CM

## 2021-03-09 ENCOUNTER — Encounter: Payer: Self-pay | Admitting: Nurse Practitioner

## 2021-03-09 ENCOUNTER — Ambulatory Visit (INDEPENDENT_AMBULATORY_CARE_PROVIDER_SITE_OTHER): Payer: PPO | Admitting: Nurse Practitioner

## 2021-03-09 ENCOUNTER — Other Ambulatory Visit: Payer: Self-pay

## 2021-03-09 ENCOUNTER — Telehealth: Payer: Self-pay | Admitting: *Deleted

## 2021-03-09 VITALS — BP 149/82 | HR 81 | Temp 98.1°F | Ht 63.0 in | Wt 141.0 lb

## 2021-03-09 DIAGNOSIS — R0982 Postnasal drip: Secondary | ICD-10-CM | POA: Diagnosis not present

## 2021-03-09 DIAGNOSIS — I10 Essential (primary) hypertension: Secondary | ICD-10-CM

## 2021-03-09 DIAGNOSIS — Z79899 Other long term (current) drug therapy: Secondary | ICD-10-CM | POA: Diagnosis not present

## 2021-03-09 MED ORDER — AMLODIPINE BESYLATE 5 MG PO TABS: 5.0000 mg | ORAL_TABLET | Freq: Every day | ORAL | 1 refills | Status: DC

## 2021-03-09 NOTE — Chronic Care Management (AMB) (Signed)
  Chronic Care Management   Note  03/09/2021 Name: [name] MRN: [id number] DOB: Dec 15, 1960  [name] is a 60 y.o. year old female who is a primary care patient of Lindell Spar, MD. I reached out to [employer] by phone today in response to a referral sent by [name]'s PCP, Dr. Posey Pronto.       [name] was given information about Chronic Care Management services today including:  CCM service includes personalized support from designated clinical staff supervised by her physician, including individualized plan of care and coordination with other care providers 24/7 contact phone numbers for assistance for urgent and routine care needs. Service will only be billed when office clinical staff spend 20 minutes or more in a month to coordinate care. Only one practitioner may furnish and bill the service in a calendar month. The patient may stop CCM services at any time (effective at the end of the month) by phone call to the office staff. The patient will be responsible for cost sharing (co-pay) of up to 20% of the service fee (after annual deductible is met).  Patient agreed to services and verbal consent obtained.   Follow up plan: Face to Face appointment with care management team member scheduled for: 03/11/21  [place] [facility]  Direct Dial: [phone]

## 2021-03-09 NOTE — Progress Notes (Signed)
Acute Office Visit  Subjective:    Patient ID: [name], female    DOB: March 03, 1961, 60 y.o.   MRN: [id number]  Chief Complaint  Patient presents with   chest congestion    Ongoing x2-3 weeks, no cough.    Otalgia    Ongoing x2-3 weeks.     Otalgia   Patient is in today for chest congestion.  Her BP has been running high, and she hasn't been taking amlodipine recently.  Past Medical History:  Diagnosis Date   Allergy    grass, dust , mold   Anxiety    Arthritis    Asthma due to seasonal allergies 06/15/2020   Bipolar disorder ([place])    Cancer ([place])    cervical cancer   Carpal tunnel syndrome    Bilateral   Chest pain 09/2011   Cardiac cath-normal coronaries   Constipation    Depression    Difficulty urinating 05/31/2013   Elevated LFTs 12/16/2013   GERD (gastroesophageal reflux disease)    History of kidney stones    Hyperlipemia    Hyperlipidemia    Hypertension    Mild; provoked by stress and anxiety   IBS (irritable bowel syndrome)    Intracerebral bleed ([place])    No aneurysm; followed by Dr. Sherwood Gambler   Loss of weight 01/06/2015   Osteoporosis    Stroke ([place]) 1999   hemorrhagic stroke; weakness of left side    Past Surgical History:  Procedure Laterality Date   ABDOMINAL HYSTERECTOMY     "cancer cells"   [place]   to remove blood clot after stroke    CARDIAC CATHETERIZATION  2016   CERVICAL FUSION     CHOLECYSTECTOMY N/A 10/14/2014   Procedure: LAPAROSCOPIC CHOLECYSTECTOMY WITH INTRAOPERATIVE CHOLANGIOGRAM;  Surgeon: Jackolyn Confer, MD;  Location: [facility];  Service: General;  Laterality: N/A;   CHONDROPLASTY Right 07/13/2017   Procedure: CHONDROPLASTY of patella;  Surgeon: Carole Civil, MD;  Location: [facility];  Service: Orthopedics;  Laterality: Right;   EUS N/A 08/21/2015   Procedure: ESOPHAGEAL ENDOSCOPIC ULTRASOUND (EUS) RADIAL;  Surgeon: Carol Ada, MD;  Location: [facility];  Service: Endoscopy;  Laterality: N/A;   KNEE  ARTHROSCOPY WITH MEDIAL MENISECTOMY Right 07/13/2017   Procedure: KNEE ARTHROSCOPY WITH PARTIAL MEDIAL MENISECTOMY;  Surgeon: Carole Civil, MD;  Location: [facility];  Service: Orthopedics;  Laterality: Right;   LEFT HEART CATHETERIZATION WITH CORONARY ANGIOGRAM N/A 09/23/2011   Procedure: LEFT HEART CATHETERIZATION WITH CORONARY ANGIOGRAM;  Surgeon: Thayer Headings, MD;  Location: [facility] CATH LAB;  Service: Cardiovascular;  Laterality: N/A;   LIPOMA EXCISION Left 11/18/2013   Procedure: EXCISION OF SOFT TISSUE MASS-LEFT THIGH;  Surgeon: Jamesetta So, MD;  Location: [facility];  Service: General;  Laterality: Left;   RECTOCELE REPAIR     x2   RECTOCELE REPAIR N/A 04/04/2017   Procedure: POSTERIOR REPAIR (RECTOCELE);  Surgeon: Jonnie Kind, MD;  Location: [facility];  Service: Gynecology;  Laterality: N/A;    Family History  Problem Relation Age of Onset   Cancer Mother        breast    Hypertension Mother    Hyperlipidemia Mother    Depression Mother    Anxiety disorder Mother    COPD Mother    Arthritis Mother        rheumatoid   Drug abuse Sister    Coronary artery disease Paternal Grandfather    Coronary artery disease Paternal Uncle  Depression Cousin    Drug abuse Cousin     Social History   Socioeconomic History   Marital status: Married    Spouse name: Sonia Side   Number of children: 0   Years of education: HS   Highest education level: Not on file  Occupational History   Occupation: unemployed    Comment: pending disability  Tobacco Use   Smoking status: Former    Packs/day: 1.00    Years: 19.00    Pack years: 19.00    Types: Cigarettes    Quit date: 09/01/1997    Years since quitting: 23.5   Smokeless tobacco: Never   Tobacco comments:    Quit smoking 1999 , previous 20 pack years  Vaping Use   Vaping Use: Never used  Substance and Sexual Activity   Alcohol use: Yes    Comment: 1 drink every other week   Drug use: No   Sexual activity: Not Currently     Partners: Male    Birth control/protection: Surgical    Comment: hyst   Other Topics Concern   Not on file  Social History Narrative   Currently unable to work   Lives in [place]   Married   Patient drinks 1 cup of caffeine daily.   Patient is right handed.    Joined the [employer] to get more exercise   Social Determinants of Health   Financial Resource Strain: Low Risk    Difficulty of Paying Living Expenses: Not hard at all  Food Insecurity: No Food Insecurity   Worried About Charity fundraiser in the Last Year: Never true   Arboriculturist in the Last Year: Never true  Transportation Needs: No Transportation Needs   Lack of Transportation (Medical): No   Lack of Transportation (Non-Medical): No  Physical Activity: Inactive   Days of Exercise per Week: 0 days   Minutes of Exercise per Session: 0 min  Stress: No Stress Concern Present   Feeling of Stress : Not at all  Social Connections: Moderately Isolated   Frequency of Communication with Friends and Family: More than three times a week   Frequency of Social Gatherings with Friends and Family: More than three times a week   Attends Religious Services: Never   Marine scientist or Organizations: No   Attends Music therapist: Never   Marital Status: Married  Human resources officer Violence: Not At Risk   Fear of Current or Ex-Partner: No   Emotionally Abused: No   Physically Abused: No   Sexually Abused: No    Outpatient Medications Prior to Visit  Medication Sig Dispense Refill   acetaminophen (TYLENOL) 500 MG tablet Take 1,000 mg by mouth every 6 (six) hours as needed for moderate pain.      ALPRAZolam (XANAX) 1 MG tablet Take 1 tablet (1 mg total) by mouth 3 (three) times daily. (Patient taking differently: Take 1 mg by mouth 2 (two) times daily.) 90 tablet 2   amphetamine-dextroamphetamine (ADDERALL) 20 MG tablet Take 1 tablet (20 mg total) by mouth 2 (two) times daily. Fill after 02/28/2021 90 tablet 0    atorvastatin (LIPITOR) 20 MG tablet TAKE ONE TABLET BY MOUTH ONCE DAILY. 90 tablet 0   Black Cohosh 40 MG CAPS Take 1 capsule by mouth daily.     Coenzyme Q10 (CO Q 10 PO) Take 1 capsule by mouth daily. Takes 277m daily.     diclofenac (CATAFLAM) 50 MG tablet Take 1 tablet (50  mg total) by mouth 2 (two) times daily. 90 tablet 3   diclofenac Sodium (VOLTAREN) 1 % GEL Apply topically 4 (four) times daily.     docusate sodium (COLACE) 100 MG capsule Take 100 mg by mouth daily.     fexofenadine (ALLEGRA) 180 MG tablet Take 180 mg by mouth daily.     fluticasone (FLONASE) 50 MCG/ACT nasal spray Place 2 sprays into both nostrils daily.     lisinopril (ZESTRIL) 20 MG tablet Take 1 tablet (20 mg total) by mouth 2 (two) times daily. 180 tablet 1   lubiprostone (AMITIZA) 8 MCG capsule TAKE 1 CAPSULE TWICE DAILY WITH MEALS. 60 capsule 0   montelukast (SINGULAIR) 10 MG tablet TAKE (1) TABLET BY MOUTH AT BEDTIME. 30 tablet 0   Multiple Vitamin (MULITIVITAMIN WITH MINERALS) TABS Take 1 tablet by mouth daily.     nystatin (MYCOSTATIN/NYSTOP) powder Apply 1 application topically 3 (three) times daily. 15 g 2   omeprazole (PRILOSEC) 40 MG capsule Take 1 capsule (40 mg total) by mouth daily. 90 capsule 1   oxybutynin (DITROPAN-XL) 10 MG 24 hr tablet Take 1 tablet (10 mg total) by mouth at bedtime. 90 tablet 1   polyethylene glycol (MIRALAX / GLYCOLAX) 17 g packet Take 17 g by mouth daily.     Potassium 99 MG TABS Take 99 mg by mouth daily.      Probiotic Product (TRUBIOTICS PO) Take 1 capsule by mouth daily. Takes 25 cfu daily.     pyridOXINE (VITAMIN B-6) 100 MG tablet Take 100 mg by mouth daily.     traZODone (DESYREL) 100 MG tablet Take 2 tablets (200 mg total) by mouth at bedtime. 60 tablet 2   UNABLE TO FIND Med Name: Total Beets, takes 2 tablets daily.     venlafaxine XR (EFFEXOR XR) 150 MG 24 hr capsule Take 1 capsule (150 mg total) by mouth daily with breakfast. 90 capsule 2   zinc gluconate 50 MG  tablet Take 50 mg by mouth daily.     B Complex-C (SUPER B COMPLEX PO) Take 1 tablet by mouth daily.  (Patient not taking: Reported on 03/09/2021)     No facility-administered medications prior to visit.    Allergies  Allergen Reactions   Morphine And Related Hives   Promethazine Hcl Other (See Comments)    Causes patient to become Hyper    Review of Systems  HENT:  Positive for ear pain.        Has to clear her throat in the AM  Respiratory: Negative.    Cardiovascular: Negative.       Objective:    Physical Exam Constitutional:      General: She is not in acute distress.    Appearance: Normal appearance. She is not ill-appearing.  HENT:     Right Ear: Tympanic membrane, ear canal and external ear normal.     Left Ear: Tympanic membrane, ear canal and external ear normal.  Cardiovascular:     Rate and Rhythm: Normal rate and regular rhythm.     Pulses: Normal pulses.     Heart sounds: Normal heart sounds.  Pulmonary:     Effort: Pulmonary effort is normal.     Breath sounds: Normal breath sounds.  Neurological:     Mental Status: She is alert.    BP (!) 149/82 (BP Location: Right Arm, Patient Position: Sitting, Cuff Size: Normal)   Pulse 81   Temp 98.1 F (36.7 C) (Oral)   Ht 5'  3" (1.6 m)   Wt 141 lb (64 kg)   SpO2 94%   BMI 24.98 kg/m  Wt Readings from Last 3 Encounters:  03/09/21 141 lb (64 kg)  03/02/21 141 lb (64 kg)  02/22/21 140 lb (63.5 kg)    Health Maintenance Due  Topic Date Due   Pneumococcal Vaccine 27-53 Years old (1 - PCV) Never done   Hepatitis C Screening  Never done   INFLUENZA VACCINE  02/01/2021    There are no preventive care reminders to display for this patient.   Lab Results  Component Value Date   TSH 1.770 08/31/2020   Lab Results  Component Value Date   WBC 5.7 08/31/2020   HGB 13.5 08/31/2020   HCT 39.9 08/31/2020   MCV 97 08/31/2020   PLT 289 08/31/2020   Lab Results  Component Value Date   NA 143 01/29/2021    K 5.0 01/29/2021   CO2 25 01/29/2021   GLUCOSE 101 (H) 01/29/2021   BUN 18 01/29/2021   CREATININE 1.17 (H) 01/29/2021   BILITOT 0.6 01/29/2021   ALKPHOS 69 01/29/2021   AST 27 01/29/2021   ALT 48 (H) 01/29/2021   PROT 7.0 01/29/2021   ALBUMIN 4.6 01/29/2021   CALCIUM 9.7 01/29/2021   ANIONGAP 11 07/10/2017   EGFR 54 (L) 01/29/2021   Lab Results  Component Value Date   CHOL 210 (H) 08/31/2020   Lab Results  Component Value Date   HDL 65 08/31/2020   Lab Results  Component Value Date   LDLCALC 105 (H) 08/31/2020   Lab Results  Component Value Date   TRIG 235 (H) 08/31/2020   Lab Results  Component Value Date   CHOLHDL 3.2 08/31/2020   Lab Results  Component Value Date   HGBA1C 5.3 06/02/2017       Assessment & Plan:   Problem List Items Addressed This Visit       Cardiovascular and Mediastinum   Hypertension - Primary   Relevant Medications   amLODipine (NORVASC) 5 MG tablet     Other   Polypharmacy    -takes multiple medications and supplements and she would like to decrease those -referral to pharmacy       Relevant Orders   AMB Referral to [place]   PND (post-nasal drip)    -she states she has been putting vick's vaporub up her nostrils, and when she wakes in the AM she has to clear her throat more often than usual -STOP inserting Vaporub in your nostrils, not for internal use--- we discussed this -if no improvement will consider ENT referral, she hasn't seen them         Meds ordered this encounter  Medications   amLODipine (NORVASC) 5 MG tablet    Sig: Take 1 tablet (5 mg total) by mouth daily.    Dispense:  90 tablet    Refill:  [place], NP

## 2021-03-09 NOTE — Assessment & Plan Note (Signed)
-  takes multiple medications and supplements and she would like to decrease those -referral to pharmacy

## 2021-03-09 NOTE — Assessment & Plan Note (Signed)
-  she states she has been putting vick's vaporub up her nostrils, and when she wakes in the AM she has to clear her throat more often than usual -STOP inserting Vaporub in your nostrils, not for internal use--- we discussed this -if no improvement will consider ENT referral, she hasn't seen them

## 2021-03-11 ENCOUNTER — Ambulatory Visit (INDEPENDENT_AMBULATORY_CARE_PROVIDER_SITE_OTHER): Payer: PPO | Admitting: Pharmacist

## 2021-03-11 ENCOUNTER — Other Ambulatory Visit: Payer: Self-pay

## 2021-03-11 ENCOUNTER — Encounter: Payer: Self-pay | Admitting: Internal Medicine

## 2021-03-11 DIAGNOSIS — F32A Depression, unspecified: Secondary | ICD-10-CM

## 2021-03-11 DIAGNOSIS — I10 Essential (primary) hypertension: Secondary | ICD-10-CM

## 2021-03-11 DIAGNOSIS — F419 Anxiety disorder, unspecified: Secondary | ICD-10-CM

## 2021-03-11 DIAGNOSIS — J45909 Unspecified asthma, uncomplicated: Secondary | ICD-10-CM

## 2021-03-11 DIAGNOSIS — E785 Hyperlipidemia, unspecified: Secondary | ICD-10-CM

## 2021-03-11 NOTE — Chronic Care Management (AMB) (Signed)
Chronic Care Management Pharmacy Note  03/11/2021 Name:  [name] MRN:  [id number] DOB:  06-24-1961  Summary: Thorough medication reconciliation and review was completed. Recommended that the patient discontinue the following supplements: potassium, zinc, beets, and CoQ10  Recommendations/Changes made from today's visit: Recommend increase to high intensity statin such as atorvastatin 40 or 80 mg by mouth every evening given history of prior stroke and LDL above goal <70 per guidelines. Will discuss with PCP.  Patient identified as a good candidate for SGLT-2 inhibitor given reduction in cardiovascular disease, slowed chronic kidney disease progression, and blood pressure lowering. Patient denies a history of significant genitourinary infections. No concern for hypotension/volume depletion. GFR at least >20 mL/minute/1.73 m2. Will discuss with PCP.   Subjective: [name] is an 60 y.o. year old female who is a primary patient of Lindell Spar, MD.  The CCM team was consulted for assistance with disease management and care coordination needs.    Engaged with patient face to face for initial visit in response to provider referral for pharmacy case management and/or care coordination services.   Consent to Services:  The patient was given the following information about Chronic Care Management services today, agreed to services, and gave verbal consent: 1. CCM service includes personalized support from designated clinical staff supervised by the primary care provider, including individualized plan of care and coordination with other care providers 2. 24/7 contact phone numbers for assistance for urgent and routine care needs. 3. Service will only be billed when office clinical staff spend 20 minutes or more in a month to coordinate care. 4. Only one practitioner may furnish and bill the service in a calendar month. 5.The patient may stop CCM services at any time (effective at the  end of the month) by phone call to the office staff. 6. The patient will be responsible for cost sharing (co-pay) of up to 20% of the service fee (after annual deductible is met). Patient agreed to services and consent obtained.  Patient Care Team: Lindell Spar, MD as PCP - General (Internal Medicine) Beryle Lathe, [facility] (Pharmacist)  Objective:  Lab Results  Component Value Date   CREATININE 1.17 (H) 01/29/2021   CREATININE 1.07 (H) 08/31/2020   CREATININE 0.88 07/10/2017    Lab Results  Component Value Date   HGBA1C 5.3 06/02/2017   Last diabetic Eye exam: No results found for: HMDIABEYEEXA  Last diabetic Foot exam: No results found for: HMDIABFOOTEX      Component Value Date/Time   CHOL 210 (H) 08/31/2020 0839   TRIG 235 (H) 08/31/2020 0839   HDL 65 08/31/2020 0839   CHOLHDL 3.2 08/31/2020 0839   CHOLHDL 2.3 09/16/2015 0939   VLDL 26 09/16/2015 0939   LDLCALC 105 (H) 08/31/2020 0839    Hepatic Function Latest Ref Rng & Units 01/29/2021 08/31/2020 06/02/2017  Total Protein 6.0 - 8.5 g/dL 7.0 6.9 7.4  Albumin 3.8 - 4.9 g/dL 4.6 4.4 5.1  AST 0 - 40 IU/L 27 38 19  ALT 0 - 32 IU/L 48(H) 60(H) 17  Alk Phosphatase 44 - 121 IU/L 69 77 80  Total Bilirubin 0.0 - 1.2 mg/dL 0.6 0.5 0.9    Lab Results  Component Value Date/Time   TSH 1.770 08/31/2020 08:39 AM   TSH 1.438 09/20/2011 08:20 PM   FREET4 1.11 08/31/2020 08:39 AM    CBC Latest Ref Rng & Units 08/31/2020 07/10/2017 06/02/2017  WBC 3.4 - 10.8 x10E3/uL 5.7 7.5 10.7  Hemoglobin 11.1 - 15.9 g/dL 13.5 13.3 14.9  Hematocrit 34.0 - 46.6 % 39.9 39.9 45.3  Platelets 150 - 450 x10E3/uL 289 304 320    Lab Results  Component Value Date/Time   VD25OH 41.6 08/31/2020 08:39 AM   VD25OH 36.8 06/02/2017 09:22 AM    Clinical ASCVD: Yes  The 10-year ASCVD risk score (Arnett DK, et al., 2019) is: 4.5%   Values used to calculate the score:     Age: 60 years     Sex: Female     Is Non-Hispanic African American: No      Diabetic: No     Tobacco smoker: No     Systolic Blood Pressure: 793 mmHg     Is BP treated: Yes     HDL Cholesterol: 65 mg/dL     Total Cholesterol: 210 mg/dL    Social History   Tobacco Use  Smoking Status Former   Packs/day: 1.00   Years: 19.00   Pack years: 19.00   Types: Cigarettes   Quit date: 09/01/1997   Years since quitting: 23.5  Smokeless Tobacco Never  Tobacco Comments   Quit smoking 1999 , previous 20 pack years   BP Readings from Last 3 Encounters:  03/09/21 (!) 149/82  03/02/21 128/85  02/22/21 137/87   Pulse Readings from Last 3 Encounters:  03/09/21 81  03/02/21 77  02/22/21 85   Wt Readings from Last 3 Encounters:  03/09/21 141 lb (64 kg)  03/02/21 141 lb (64 kg)  02/22/21 140 lb (63.5 kg)    Assessment: Review of patient past medical history, allergies, medications, health status, including review of consultants reports, laboratory and other test data, was performed as part of comprehensive evaluation and provision of chronic care management services.   SDOH:  (Social Determinants of Health) assessments and interventions performed:    CCM Care Plan  Allergies  Allergen Reactions   Morphine And Related Hives   Promethazine Hcl Other (See Comments)    Causes patient to become Hyper    Medications Reviewed Today     Reviewed by Beryle Lathe, [facility] (Pharmacist) on 03/11/21 at 1200  Med List Status: <None>   Medication Order Taking? Sig Documenting Provider Last Dose Status Informant  acetaminophen (TYLENOL) 500 MG tablet [id number] Yes Take 500-1,000 mg by mouth every 6 (six) hours as needed for moderate pain. [provider] Taking Active Self  ALPRAZolam Duanne Moron) 1 MG tablet [id number] Yes Take 1 tablet (1 mg total) by mouth 3 (three) times daily.  Patient taking differently: Take 1 mg by mouth 2 (two) times daily.   Cloria Spring, MD Taking Active   amLODipine (NORVASC) 5 MG tablet [id number] Yes Take 1 tablet (5 mg total)  by mouth daily. Noreene Larsson, NP Taking Active   amphetamine-dextroamphetamine (ADDERALL) 20 MG tablet [id number] Yes Take 1 tablet (20 mg total) by mouth 2 (two) times daily. Fill after 02/28/2021 Cloria Spring, MD Taking Active   atorvastatin (LIPITOR) 20 MG tablet [id number] Yes TAKE ONE TABLET BY MOUTH ONCE DAILY. Lindell Spar, MD Taking Active   Black Cohosh 40 MG CAPS [id number] Yes Take 1 capsule by mouth daily. [provider] Taking Active   Calcium 500-125 MG-UNIT TABS [id number] Yes Take 1 tablet by mouth daily with supper. [provider] Taking Active Self  cimetidine (TAGAMET) 200 MG tablet [id number] Yes Take 200 mg by mouth daily as needed. [provider] Taking Active   diclofenac (CATAFLAM) 50 MG tablet  [id number] Yes Take 1 tablet (50 mg total) by mouth 2 (two) times daily. Carole Civil, MD Taking Active   docusate sodium (COLACE) 100 MG capsule [id number] Yes Take 100 mg by mouth daily. [provider] Taking Active   fexofenadine (ALLEGRA) 180 MG tablet [id number] Yes Take 180 mg by mouth daily. [provider] Taking Active   fluticasone (FLONASE) 50 MCG/ACT nasal spray [id number] Yes Place 2 sprays into both nostrils daily. [provider] Taking Active   lisinopril (ZESTRIL) 20 MG tablet [id number] Yes Take 1 tablet (20 mg total) by mouth 2 (two) times daily. Lindell Spar, MD Taking Active   lubiprostone (AMITIZA) 8 MCG capsule [id number] Yes TAKE 1 CAPSULE TWICE DAILY WITH MEALS. Lindell Spar, MD Taking Active   montelukast (SINGULAIR) 10 MG tablet [id number] Yes TAKE (1) TABLET BY MOUTH AT BEDTIME. Fayrene Helper, MD Taking Active   Multiple Vitamin [facility] WITH MINERALS) TABS [id number] Yes Take 1 tablet by mouth daily with breakfast. [provider] Taking Active Self  nabumetone (RELAFEN) 500 MG tablet [id number] Yes Take 500 mg by mouth every 8 (eight) hours as needed (joint pain). [provider] Taking Active   nystatin (MYCOSTATIN/NYSTOP) powder [id number] Yes Apply 1 application topically 3 (three) times daily.  Patient taking differently: Apply 1 application topically 3 (three) times daily as needed.   Lindell Spar, MD Taking Active   omeprazole (PRILOSEC) 40 MG capsule [id number] Yes Take 1 capsule (40 mg total) by mouth daily. Lindell Spar, MD Taking Active   oxybutynin (DITROPAN-XL) 10 MG 24 hr tablet [id number] Yes Take 1 tablet (10 mg total) by mouth at bedtime. Lindell Spar, MD Taking Active   polyethylene glycol (MIRALAX / GLYCOLAX) 17 g packet [id number] Yes Take 17 g by mouth daily. [provider] Taking Active   Probiotic Product (TRUBIOTICS PO) [id number] Yes Take 1 capsule by mouth daily. Takes 25 cfu daily. [provider] Taking Active   pyridOXINE (VITAMIN B-6) 100 MG tablet [id number] Yes Take 100 mg by mouth daily. [provider] Taking Active   traZODone (DESYREL) 100 MG tablet [id number] Yes Take 2 tablets (200 mg total) by mouth at bedtime. Cloria Spring, MD Taking Active   venlafaxine XR (EFFEXOR XR) 150 MG 24 hr capsule [id number] Yes Take 1 capsule (150 mg total) by mouth daily with breakfast. 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   History of hemorrhagic stroke with residual hemiplegia ([place]) 06/15/2020   Urinary incontinence 06/15/2020   Asthma due to seasonal allergies 06/15/2020   Vaginal itching 03/16/2020   S/P right knee arthroscopy 07/13/17 07/20/2017   Derangement of posterior horn of medial meniscus of right knee    Chondromalacia, patella, right    IBS (irritable bowel syndrome) 05/11/2015   Gait instability 01/06/2015   Dyspareunia, female 12/11/2014   DDD (degenerative  disc disease), lumbar 02/18/2014   Elevated levels of transaminase & lactic acid dehydrogenase 12/16/2013   Cerebral infarction ([place]) 11/01/2013   Postmenopausal atrophic vaginitis 11/01/2013   Lipoma of abdominal wall 08/14/2013   Back pain 05/29/2013   Paresthesia of foot 02/12/2013   Hypertension    GERD (gastroesophageal reflux disease)  Depression 11/17/2011   Insomnia 11/17/2011   Hyperlipidemia 09/20/2011   Anxiety 09/20/2011    Immunization History  Administered Date(s) Administered   Influenza Split 08/13/2012, 05/11/2013   Influenza,inj,Quad PF,6+ Mos 05/11/2015, 03/24/2019   Influenza-Unspecified 05/10/2014, 04/17/2017, 03/16/2020   PFIZER(Purple Top)SARS-COV-2 Vaccination 09/26/2019, 10/19/2019, 08/12/2020   Tdap 04/30/2008, 02/12/2013    Conditions to be addressed/monitored: HTN, HLD, CKD Stage 3a, Anxiety, Depression, and asthma  Care Plan : Medication Management  Updates made by Beryle Lathe, [place] since 03/11/2021 12:00 AM     Problem: HTN, HLD, CKD, ASthma/Allergies, Anxiety/Depression   Priority: High  Onset Date: 03/11/2021     Long-Range Goal: Disease Progression Prevention   Start Date: 03/11/2021  Expected End Date: 06/09/2021  This Visit's Progress: On track  Priority: High  Note:   Current Barriers:  Unable to achieve control of hyperlipidemia Suboptimal therapeutic regimen for chronic kidney disease  Pharmacist Clinical Goal(s):  Over the next 90 days, patient will Achieve control of hyperlipidemia as evidenced by improved LDL and improved triglycerides Adhere to plan to optimize therapeutic regimen for chronic kidney disease as evidenced by report of adherence to recommended medication management changes through collaboration with PharmD and provider.   Interventions: 1:1 collaboration with Lindell Spar, MD regarding development and update of comprehensive plan of care as evidenced by provider attestation and  co-signature Inter-disciplinary care team collaboration (see longitudinal plan of care) Comprehensive medication review performed; medication list updated in electronic medical record  Hypertension: Blood pressure under good control. Blood pressure is at goal of <130/80 mmHg per 2017 AHA/ACC guidelines. Current medications: lisinopril 20 mg by mouth once daily and amlodipine 5 mg by mouth every evening Intolerances: none Taking medications as directed: yes Side effects thought to be attributed to current medication regimen: no Current exercise: not discussed today Current home blood pressure: 120s/70-80s per patient verbal report Continue lisinopril 20 mg by mouth once daily and amlodipine 5 mg by mouth every evening Encourage dietary sodium restriction/DASH diet Recommend regular aerobic exercise Recommend home blood pressure monitoring to discuss at next visit Reviewed risks of hypertension, principles of treatment and consequences of untreated hypertension  Hyperlipidemia: Uncontrolled. LDL above goal of <70 due to very high risk given established clinical ASCVD (stroke in 1999) per 2020 AACE/ACE guidelines and TG above goal of <150 per 2020 AACE/ACE guidelines. Current medications: atorvastatin 20 mg by mouth every evening Intolerances: none Taking medications as directed: yes Side effects thought to be attributed to current medication regimen: no Recommend increase to atorvastatin 40 or 80 mg by mouth every evening. Will discuss with PCP.  Recommend regular aerobic exercise Reviewed risks of hyperlipidemia, principles of treatment and consequences of untreated hyperlipidemia Re-check lipid panel in 4-12 weeks  Asthma/Allergies: Controlled.  Current treatment: montelukast (Singulair) 10 mg by mouth once daily in the evening, Allegra and Flonase Patient denies the need for a rescue inhaler as she does not normally have shortness of breath unless she exerts herself more than  normal Continue current medication management Recommend allergen avoidance (environmental control) Encouraged regular physical activity for general health benefits  Chronic Kidney Disease Stage 3a - GFR 45-59 (Mild to Moderately Reduced Function): Suboptimally managed Current medications:  none Intolerances:  n/a Taking medications as directed: n/a Side effects thought to be attributed to current medication regimen: n/a Most recent GFR: 54 mL/min Most recent microalbumin: unknown Recommend adequate hydration  Recommend regular aerobic exercise Discussed need for improved control of cholesterol Patient identified as a good candidate  for SGLT-2 inhibitor given reduction in cardiovascular disease, slowed chronic kidney disease progression, and blood pressure lowering. Patient denies a history of significant genitourinary infections. No concern for hypotension/volume depletion. GFR at least >20 mL/minute/1.73 m2. Will discuss with PCP.   Anxiety/Depression: Controlled per patient Current medications: venlafaxine 150 mg by mouth daily, alprazolam 1 mg three time daily as needed for anxiety, and trazodone 200 mg by mouth at bedtime Patient reports that she follows with a psych provider Continue current medication management   Patient Goals/Self-Care Activities Over the next 90 days, patient will:  Take medications as prescribed Check blood pressure at least once daily, document, and provide at future appointments Target a minimum of 150 minutes of moderate intensity exercise weekly  Follow Up Plan: Telephone follow up appointment with care management team member scheduled for: 03/24/21      Medication Assistance: None required.  Patient affirms current coverage meets needs.  Patient's preferred pharmacy is:  [place], [place] [address] Phone: [phone] Fax: [phone]  [place] [address] [place] -  [place] [address] [place] [address] [place] [address] Phone: [phone] Fax: [phone]  Follow Up:  Patient agrees to Care Plan and Follow-up.  Plan: Telephone follow up appointment with care management team member scheduled for:  03/24/21  Kennon Holter, PharmD Clinical Pharmacist [facility] [phone]

## 2021-03-11 NOTE — Patient Instructions (Signed)
[name],  It was great to talk to you today!  Things we discussed Stop taking the following supplements: potassium, zinc, beets, and CoQ10 Take multivitamin with breakfast and calcium supplement with supper I recommend taking Pepcid over the counter instead of Tagemet for heart burn as needed  I will talk to Dr. Posey Pronto about increasing your atorvastatin and about maybe starting you on a medication for your chronic kidney disease  Please call me with any questions or concerns.   Visit Information   PATIENT GOALS:   Goals Addressed             This Visit's Progress    Medication Management       Patient Goals/Self-Care Activities Over the next 90 days, patient will:  Take medications as prescribed Check blood pressure at least once daily, document, and provide at future appointments Target a minimum of 150 minutes of moderate intensity exercise weekly        Consent to CCM Services: [name] was given information about Chronic Care Management services including:  CCM service includes personalized support from designated clinical staff supervised by her physician, including individualized plan of care and coordination with other care providers 24/7 contact phone numbers for assistance for urgent and routine care needs. Service will only be billed when office clinical staff spend 20 minutes or more in a month to coordinate care. Only one practitioner may furnish and bill the service in a calendar month. The patient may stop CCM services at any time (effective at the end of the month) by phone call to the office staff. The patient will be responsible for cost sharing (co-pay) of up to 20% of the service fee (after annual deductible is met).  Patient agreed to services and verbal consent obtained.   Patient verbalizes understanding of instructions provided today and agrees to view in [place].   Telephone follow up appointment with care management team member  scheduled for:03/24/21  Kennon Holter, PharmD Clinical Pharmacist [facility] [phone]  CLINICAL CARE PLAN: Patient Care Plan: Medication Management     Problem Identified: HTN, HLD, CKD, ASthma/Allergies, Anxiety/Depression   Priority: High  Onset Date: 03/11/2021     Long-Range Goal: Disease Progression Prevention   Start Date: 03/11/2021  Expected End Date: 06/09/2021  This Visit's Progress: On track  Priority: High  Note:   Current Barriers:  Unable to achieve control of hyperlipidemia Suboptimal therapeutic regimen for chronic kidney disease  Pharmacist Clinical Goal(s):  Over the next 90 days, patient will Achieve control of hyperlipidemia as evidenced by improved LDL and improved triglycerides Adhere to plan to optimize therapeutic regimen for chronic kidney disease as evidenced by report of adherence to recommended medication management changes through collaboration with PharmD and provider.   Interventions: 1:1 collaboration with Diamond Spar, MD regarding development and update of comprehensive plan of care as evidenced by provider attestation and co-signature Inter-disciplinary care team collaboration (see longitudinal plan of care) Comprehensive medication review performed; medication list updated in electronic medical record  Hypertension: Blood pressure under good control. Blood pressure is at goal of <130/80 mmHg per 2017 AHA/ACC guidelines. Current medications: lisinopril 20 mg by mouth once daily and amlodipine 5 mg by mouth every evening Intolerances: none Taking medications as directed: yes Side effects thought to be attributed to current medication regimen: no Current exercise: not discussed today Current home blood pressure: 120s/70-80s per patient verbal report Continue lisinopril 20 mg by mouth once daily and amlodipine 5 mg by  mouth every evening Encourage dietary sodium restriction/DASH diet Recommend regular aerobic  exercise Recommend home blood pressure monitoring to discuss at next visit Reviewed risks of hypertension, principles of treatment and consequences of untreated hypertension  Hyperlipidemia: Uncontrolled. LDL above goal of <70 due to very high risk given established clinical ASCVD (stroke in 1999) per 2020 AACE/ACE guidelines and TG above goal of <150 per 2020 AACE/ACE guidelines. Current medications: atorvastatin 20 mg by mouth every evening Intolerances: none Taking medications as directed: yes Side effects thought to be attributed to current medication regimen: no Recommend increase to atorvastatin 40 or 80 mg by mouth every evening. Will discuss with PCP.  Recommend regular aerobic exercise Reviewed risks of hyperlipidemia, principles of treatment and consequences of untreated hyperlipidemia Re-check lipid panel in 4-12 weeks  Asthma/Allergies: Controlled.  Current treatment: montelukast (Singulair) 10 mg by mouth once daily in the evening, Allegra and Flonase Patient denies the need for a rescue inhaler as she does not normally have shortness of breath unless she exerts herself more than normal Continue current medication management Recommend allergen avoidance (environmental control) Encouraged regular physical activity for general health benefits  Chronic Kidney Disease Stage 3a - GFR 45-59 (Mild to Moderately Reduced Function): Suboptimally managed Current medications:  none Intolerances:  n/a Taking medications as directed: n/a Side effects thought to be attributed to current medication regimen: n/a Most recent GFR: 54 mL/min Most recent microalbumin: unknown Recommend adequate hydration  Recommend regular aerobic exercise Discussed need for improved control of cholesterol Patient identified as a good candidate for SGLT-2 inhibitor given reduction in cardiovascular disease, slowed chronic kidney disease progression, and blood pressure lowering. Patient denies a history of  significant genitourinary infections. No concern for hypotension/volume depletion. GFR at least >20 mL/minute/1.73 m2. Will discuss with PCP.   Anxiety/Depression: Controlled per patient Current medications: venlafaxine 150 mg by mouth daily, alprazolam 1 mg three time daily as needed for anxiety, and trazodone 200 mg by mouth at bedtime Patient reports that she follows with a psych provider Continue current medication management   Patient Goals/Self-Care Activities Over the next 90 days, patient will:  Take medications as prescribed Check blood pressure at least once daily, document, and provide at future appointments Target a minimum of 150 minutes of moderate intensity exercise weekly  Follow Up Plan: Telephone follow up appointment with care management team member scheduled for: 03/24/21

## 2021-03-15 ENCOUNTER — Other Ambulatory Visit: Payer: Self-pay | Admitting: *Deleted

## 2021-03-15 DIAGNOSIS — I10 Essential (primary) hypertension: Secondary | ICD-10-CM

## 2021-03-16 ENCOUNTER — Ambulatory Visit: Payer: PPO | Admitting: Physical Medicine and Rehabilitation

## 2021-03-16 ENCOUNTER — Encounter: Payer: Self-pay | Admitting: Internal Medicine

## 2021-03-16 ENCOUNTER — Ambulatory Visit (INDEPENDENT_AMBULATORY_CARE_PROVIDER_SITE_OTHER): Payer: PPO | Admitting: Internal Medicine

## 2021-03-16 ENCOUNTER — Other Ambulatory Visit: Payer: Self-pay

## 2021-03-16 DIAGNOSIS — R202 Paresthesia of skin: Secondary | ICD-10-CM

## 2021-03-16 DIAGNOSIS — E785 Hyperlipidemia, unspecified: Secondary | ICD-10-CM

## 2021-03-16 DIAGNOSIS — N1831 Chronic kidney disease, stage 3a: Secondary | ICD-10-CM | POA: Insufficient documentation

## 2021-03-16 DIAGNOSIS — E782 Mixed hyperlipidemia: Secondary | ICD-10-CM | POA: Diagnosis not present

## 2021-03-16 HISTORY — DX: Chronic kidney disease, stage 3a: N18.31

## 2021-03-16 MED ORDER — ATORVASTATIN CALCIUM 40 MG PO TABS: 40.0000 mg | ORAL_TABLET | Freq: Every day | ORAL | 3 refills | Status: DC

## 2021-03-16 MED ORDER — ATORVASTATIN CALCIUM 40 MG PO TABS: 20.0000 mg | ORAL_TABLET | Freq: Every day | ORAL | 3 refills | Status: DC

## 2021-03-16 NOTE — Assessment & Plan Note (Signed)
On Atorvastatin 20 mg QD Increased to 40 mg QD due to h/o CVA and uncontrolled HLD

## 2021-03-16 NOTE — Progress Notes (Signed)
Virtual Visit via Telephone Note   This visit type was conducted due to national recommendations for restrictions regarding the COVID-19 Pandemic (e.g. social distancing) in an effort to limit this patient's exposure and mitigate transmission in our community.  Due to her co-morbid illnesses, this patient is at least at moderate risk for complications without adequate follow up.  This format is felt to be most appropriate for this patient at this time.  The patient did not have access to video technology/had technical difficulties with video requiring transitioning to audio format only (telephone).  All issues noted in this document were discussed and addressed.  No physical exam could be performed with this format.  Evaluation Performed:  Follow-up visit  Date:  03/16/2021   ID:  Diamond, Santiago [birth year], MRN [id number]  Patient Location: Home Provider Location: Office/Clinic  Participants: Patient Location of Patient: Home Location of Provider: Telehealth Consent was obtain for visit to be over via telehealth. I verified that I am speaking with the correct person using two identifiers.  PCP:  Lindell Spar, MD   Chief Complaint:  HLD  History of Present Illness:    [name] is a 60 y.o. female who has a televisit for follow up of HLD. She recently had a visit with clinical pharmacist and was told that she would benefit from higher dose of statin and she is willing to increase the dose now.  Her last BMP showed GFR of 54, but previous BMPs showed normal kidney function tests. She prefers to wait for next BMP before starting Jardiance.  The patient does not have symptoms concerning for COVID-19 infection (fever, chills, cough, or new shortness of breath).   Past Medical, Surgical, Social History, Allergies, and Medications have been Reviewed.  Past Medical History:  Diagnosis Date   Allergy    grass, dust , mold   Anxiety    Arthritis    Asthma due to  seasonal allergies 06/15/2020   Bipolar disorder ([place])    Cancer ([place])    cervical cancer   Carpal tunnel syndrome    Bilateral   Chest pain 09/2011   Cardiac cath-normal coronaries   Constipation    Depression    Difficulty urinating 05/31/2013   Elevated LFTs 12/16/2013   GERD (gastroesophageal reflux disease)    History of kidney stones    Hyperlipemia    Hyperlipidemia    Hypertension    Mild; provoked by stress and anxiety   IBS (irritable bowel syndrome)    Intracerebral bleed ([place])    No aneurysm; followed by Dr. Sherwood Gambler   Loss of weight 01/06/2015   Osteoporosis    Stage 3a chronic kidney disease ([place]) 03/16/2021   Stroke ([place]) 1999   hemorrhagic stroke; weakness of left side   Past Surgical History:  Procedure Laterality Date   ABDOMINAL HYSTERECTOMY     "cancer cells"   [place]   to remove blood clot after stroke    CARDIAC CATHETERIZATION  2016   CERVICAL FUSION     CHOLECYSTECTOMY N/A 10/14/2014   Procedure: LAPAROSCOPIC CHOLECYSTECTOMY WITH INTRAOPERATIVE CHOLANGIOGRAM;  Surgeon: Jackolyn Confer, MD;  Location: [facility];  Service: General;  Laterality: N/A;   CHONDROPLASTY Right 07/13/2017   Procedure: CHONDROPLASTY of patella;  Surgeon: Carole Civil, MD;  Location: [facility];  Service: Orthopedics;  Laterality: Right;   EUS N/A 08/21/2015   Procedure: ESOPHAGEAL ENDOSCOPIC ULTRASOUND (EUS) RADIAL;  Surgeon: Carol Ada, MD;  Location:  [facility];  Service: Endoscopy;  Laterality: N/A;   KNEE ARTHROSCOPY WITH MEDIAL MENISECTOMY Right 07/13/2017   Procedure: KNEE ARTHROSCOPY WITH PARTIAL MEDIAL MENISECTOMY;  Surgeon: Carole Civil, MD;  Location: [facility];  Service: Orthopedics;  Laterality: Right;   LEFT HEART CATHETERIZATION WITH CORONARY ANGIOGRAM N/A 09/23/2011   Procedure: LEFT HEART CATHETERIZATION WITH CORONARY ANGIOGRAM;  Surgeon: Thayer Headings, MD;  Location: [facility];  Service: Cardiovascular;  Laterality: N/A;   LIPOMA EXCISION Left  11/18/2013   Procedure: EXCISION OF SOFT TISSUE MASS-LEFT THIGH;  Surgeon: Jamesetta So, MD;  Location: [facility];  Service: General;  Laterality: Left;   RECTOCELE REPAIR     x2   RECTOCELE REPAIR N/A 04/04/2017   Procedure: POSTERIOR REPAIR (RECTOCELE);  Surgeon: Jonnie Kind, MD;  Location: [facility];  Service: Gynecology;  Laterality: N/A;     Current Meds  Medication Sig   acetaminophen (TYLENOL) 500 MG tablet Take 500-1,000 mg by mouth every 6 (six) hours as needed for moderate pain.   ALPRAZolam (XANAX) 1 MG tablet Take 1 tablet (1 mg total) by mouth 3 (three) times daily. (Patient taking differently: Take 1 mg by mouth 2 (two) times daily.)   amLODipine (NORVASC) 5 MG tablet Take 1 tablet (5 mg total) by mouth daily.   amphetamine-dextroamphetamine (ADDERALL) 20 MG tablet Take 1 tablet (20 mg total) by mouth 2 (two) times daily. Fill after 02/28/2021   Black Cohosh 40 MG CAPS Take 1 capsule by mouth daily.   Calcium 500-125 MG-UNIT TABS Take 1 tablet by mouth daily with supper.   cimetidine (TAGAMET) 200 MG tablet Take 200 mg by mouth daily as needed.   diclofenac (CATAFLAM) 50 MG tablet Take 1 tablet (50 mg total) by mouth 2 (two) times daily.   docusate sodium (COLACE) 100 MG capsule Take 100 mg by mouth daily.   fexofenadine (ALLEGRA) 180 MG tablet Take 180 mg by mouth daily.   fluticasone (FLONASE) 50 MCG/ACT nasal spray Place 2 sprays into both nostrils daily.   lisinopril (ZESTRIL) 20 MG tablet Take 1 tablet (20 mg total) by mouth 2 (two) times daily.   lubiprostone (AMITIZA) 8 MCG capsule TAKE 1 CAPSULE TWICE DAILY WITH MEALS.   montelukast (SINGULAIR) 10 MG tablet TAKE (1) TABLET BY MOUTH AT BEDTIME.   Multiple Vitamin (MULITIVITAMIN WITH MINERALS) TABS Take 1 tablet by mouth daily with breakfast.   nabumetone (RELAFEN) 500 MG tablet Take 500 mg by mouth every 8 (eight) hours as needed (joint pain).   nystatin (MYCOSTATIN/NYSTOP) powder Apply 1 application topically 3 (three)  times daily. (Patient taking differently: Apply 1 application topically 3 (three) times daily as needed.)   omeprazole (PRILOSEC) 40 MG capsule Take 1 capsule (40 mg total) by mouth daily.   oxybutynin (DITROPAN-XL) 10 MG 24 hr tablet Take 1 tablet (10 mg total) by mouth at bedtime.   polyethylene glycol (MIRALAX / GLYCOLAX) 17 g packet Take 17 g by mouth daily.   Probiotic Product (TRUBIOTICS PO) Take 1 capsule by mouth daily. Takes 25 cfu daily.   pyridOXINE (VITAMIN B-6) 100 MG tablet Take 100 mg by mouth daily.   traZODone (DESYREL) 100 MG tablet Take 2 tablets (200 mg total) by mouth at bedtime.   venlafaxine XR (EFFEXOR XR) 150 MG 24 hr capsule Take 1 capsule (150 mg total) by mouth daily with breakfast.   [DISCONTINUED] atorvastatin (LIPITOR) 20 MG tablet TAKE ONE TABLET BY MOUTH ONCE DAILY.     Allergies:  Morphine and related and Promethazine hcl   ROS:   Please see the history of present illness.     All other systems reviewed and are negative.   Labs/Other Tests and Data Reviewed:    Recent Labs: 08/31/2020: Hemoglobin 13.5; Platelets 289; TSH 1.770 01/29/2021: ALT 48; BUN 18; Creatinine, Ser 1.17; Potassium 5.0; Sodium 143   Recent Lipid Panel Lab Results  Component Value Date/Time   CHOL 210 (H) 08/31/2020 08:39 AM   TRIG 235 (H) 08/31/2020 08:39 AM   HDL 65 08/31/2020 08:39 AM   CHOLHDL 3.2 08/31/2020 08:39 AM   CHOLHDL 2.3 09/16/2015 09:39 AM   LDLCALC 105 (H) 08/31/2020 08:39 AM    Wt Readings from Last 3 Encounters:  03/09/21 141 lb (64 kg)  03/02/21 141 lb (64 kg)  02/22/21 140 lb (63.5 kg)     ASSESSMENT & PLAN:    Mixed hyperlipidemia On Atorvastatin 20 mg QD Increased to 40 mg QD due to h/o CVA and uncontrolled HLD  Stage 3a chronic kidney disease (HCC) Last BMP showed GFR of 54 Previous BMPs showed GFR > 60. Will recheck in the next visit If persistent, plan to start Jardiance Avoid nephrotoxic agents including NSAIDs, had told her to avoid  Nabumetone in the last visit   Time:   Today, I have spent 17 minutes reviewing the chart, including problem list, medications, and with the patient with telehealth technology discussing the above problems.   Medication Adjustments/Labs and Tests Ordered: Current medicines are reviewed at length with the patient today.  Concerns regarding medicines are outlined above.   Tests Ordered: No orders of the defined types were placed in this encounter.   Medication Changes: Meds ordered this encounter  Medications   atorvastatin (LIPITOR) 40 MG tablet    Sig: Take 0.5 tablets (20 mg total) by mouth daily.    Dispense:  90 tablet    Refill:  3     Note: This dictation was prepared with Dragon dictation along with smaller phrase technology. Similar sounding words can be transcribed inadequately or may not be corrected upon review. Any transcriptional errors that result from this process are unintentional.      Disposition:  Follow up  Signed, Lindell Spar, MD  03/16/2021 2:13 PM     [facility]

## 2021-03-16 NOTE — Assessment & Plan Note (Signed)
Last BMP showed GFR of 54 Previous BMPs showed GFR > 60. Will recheck in the next visit If persistent, plan to start Jardiance Avoid nephrotoxic agents including NSAIDs, had told her to avoid Nabumetone in the last visit

## 2021-03-16 NOTE — Progress Notes (Signed)
[name] - 60 y.o. female MRN [id number]  Date of birth: March 03, 1961  Office Visit Note: Visit Date: 03/16/2021 PCP: Lindell Spar, MD Referred by: Lindell Spar, MD  Subjective: Chief Complaint  Patient presents with   Right Hand - Pain   Left Hand - Weakness   HPI:  [name] is a 60 y.o. female who comes in today at the request of Dr. Arther Abbott for electrodiagnostic study of the Bilateral upper extremities.  Patient is Right hand dominant.  She has a 60-monthhistory of pain in her right thumb and electrical paresthesias shooting into her fingers associated with waking up in the morning with the hands feeling tight difficulty closing the fist.  The right hand is worse than the left and the right thumb seems to have pain in the base of the joint at the [facility].  She has no history of prior electrodiagnostic studies.  She does have history of hemorrhagic stroke with residual hemiplegia. Sh also had remote cervical fusion.  ROS Otherwise per HPI.  Assessment & Plan: Visit Diagnoses:    ICD-10-CM   1. Paresthesia of skin  R20.2 NCV with EMG (electromyography)      Plan: Impression: Essentially NORMAL electrodiagnostic study of the right upper limb.  There is no significant electrodiagnostic evidence of nerve entrapment, brachial plexopathy or cervical radiculopathy.    As you know, purely sensory or demyelinating radiculopathies and chemical radiculitis may not be detected with this particular electrodiagnostic study.  Recommendations: 1.  Follow-up with referring physician. 2.  Continue current management of symptoms.  Meds & Orders: No orders of the defined types were placed in this encounter.   Orders Placed This Encounter  Procedures   NCV with EMG (electromyography)    Follow-up: Return in about 2 weeks (around 03/30/2021) for SArther Abbott MD.   Procedures: No procedures performed  EMG & NCV Findings: All nerve conduction studies (as  indicated in the following tables) were within normal limits.  All left vs. right side differences were within normal limits.    All examined muscles (as indicated in the following table) showed no evidence of electrical instability.    Impression: Essentially NORMAL electrodiagnostic study of the right upper limb.  There is no significant electrodiagnostic evidence of nerve entrapment, brachial plexopathy or cervical radiculopathy.    As you know, purely sensory or demyelinating radiculopathies and chemical radiculitis may not be detected with this particular electrodiagnostic study.  Recommendations: 1.  Follow-up with referring physician. 2.  Continue current management of symptoms.  ___________________________ FLaurence SpatesFAAPMR Board Certified, American Board of Physical Medicine and Rehabilitation    Nerve Conduction Studies Anti Sensory Summary Table   Stim Site NR Peak (ms) Norm Peak (ms) P-T Amp (V) Norm P-T Amp Site1 Site2 Delta-P (ms) Dist (cm) Vel (m/s) Norm Vel (m/s)  Left Median Acr Palm Anti Sensory (2nd Digit)  30.1C  Wrist    3.1 <3.6 30.7 >10 Wrist Palm 1.4 0.0    Palm    1.7 <2.0 35.5         Right Median Acr Palm Anti Sensory (2nd Digit)  31.9C  Wrist    3.4 <3.6 16.1 >10 Wrist Palm 1.6 0.0    Palm    1.8 <2.0 24.8         Right Radial Anti Sensory (Base 1st Digit)  31.6C  Wrist    2.1 <3.1 41.8  Wrist Base 1st Digit 2.1 0.0  Right Ulnar Anti Sensory (5th Digit)  32C  Wrist    3.1 <3.7 19.0 >15.0 Wrist 5th Digit 3.1 14.0 45 >38   Motor Summary Table   Stim Site NR Onset (ms) Norm Onset (ms) O-P Amp (mV) Norm O-P Amp Site1 Site2 Delta-0 (ms) Dist (cm) Vel (m/s) Norm Vel (m/s)  Left Median Motor (Abd Poll Brev)  30.5C  Wrist    2.9 <4.2 6.2 >5 Elbow Wrist 3.9 19.5 50 >50  Elbow    6.8  6.0         Right Median Motor (Abd Poll Brev)  31.6C  Wrist    3.3 <4.2 8.0 >5 Elbow Wrist 3.8 19.0 50 >50  Elbow    7.1  8.0         Right Ulnar Motor (Abd Dig  Min)  31.7C  Wrist    2.6 <4.2 8.9 >3 B Elbow Wrist 3.2 18.0 56 >53  B Elbow    5.8  8.6  A Elbow B Elbow 1.5 10.0 67 >53  A Elbow    7.3  5.3          EMG   Side Muscle Nerve Root Ins Act Fibs Psw Amp Dur Poly Recrt Int Fraser Din Comment  Right Abd Poll Brev Median C8-T1 Nml Nml Nml Nml Nml 0 Nml Nml   Right 1stDorInt Ulnar C8-T1 Nml Nml Nml Nml Nml 0 Nml Nml   Right PronatorTeres Median C6-7 Nml Nml Nml Nml Nml 0 Nml Nml   Right Biceps Musculocut C5-6 Nml Nml Nml Nml Nml 0 Nml Nml   Right Deltoid Axillary C5-6 Nml Nml Nml Nml Nml 0 Nml Nml     Nerve Conduction Studies Anti Sensory Left/Right Comparison   Stim Site L Lat (ms) R Lat (ms) L-R Lat (ms) L Amp (V) R Amp (V) L-R Amp (%) Site1 Site2 L Vel (m/s) R Vel (m/s) L-R Vel (m/s)  Median Acr Palm Anti Sensory (2nd Digit)  30.1C  Wrist 3.1 3.4 0.3 30.7 16.1 47.6 Wrist Palm     Palm 1.7 1.8 0.1 35.5 24.8 30.1       Radial Anti Sensory (Base 1st Digit)  31.6C  Wrist  2.1   41.8  Wrist Base 1st Digit     Ulnar Anti Sensory (5th Digit)  32C  Wrist  3.1   19.0  Wrist 5th Digit  45    Motor Left/Right Comparison   Stim Site L Lat (ms) R Lat (ms) L-R Lat (ms) L Amp (mV) R Amp (mV) L-R Amp (%) Site1 Site2 L Vel (m/s) R Vel (m/s) L-R Vel (m/s)  Median Motor (Abd Poll Brev)  30.5C  Wrist 2.9 3.3 0.4 6.2 8.0 22.5 Elbow Wrist 50 50 0  Elbow 6.8 7.1 0.3 6.0 8.0 25.0       Ulnar Motor (Abd Dig Min)  31.7C  Wrist  2.6   8.9  B Elbow Wrist  56   B Elbow  5.8   8.6  A Elbow B Elbow  67   A Elbow  7.3   5.3           Waveforms:                Clinical History: No specialty comments available.     Objective:  VS:  HT:    WT:   BMI:     BP:   HR: bpm  TEMP: ( )  RESP:  Physical Exam Musculoskeletal:  General: No swelling, tenderness or deformity.     Comments: Inspection reveals no atrophy of the bilateral APB or FDI or hand intrinsics. There is no swelling, color changes, allodynia or dystrophic changes. There is 5  out of 5 strength in the right wrist extension, finger abduction and long finger flexion. She has residual weakness on the left from prior stroke. There is intact sensation to light touch in all dermatomal and peripheral nerve distributions.  There is a negative Hoffmann's test bilaterally.  Skin:    General: Skin is warm and dry.     Findings: No erythema or rash.  Neurological:     General: No focal deficit present.     Mental Status: She is alert and oriented to person, place, and time.     Motor: No weakness or abnormal muscle tone.     Coordination: Coordination normal.  Psychiatric:        Mood and Affect: Mood normal.        Behavior: Behavior normal.     Imaging: No results found.

## 2021-03-16 NOTE — Progress Notes (Signed)
Stiffness and pain in hands- right worse than left. Weakness in left hand from stroke. Right hand dominant No lotion per patient

## 2021-03-16 NOTE — Patient Instructions (Signed)
Please start taking Atorvastatin 40 mg once daily.

## 2021-03-17 NOTE — Procedures (Signed)
EMG & NCV Findings: All nerve conduction studies (as indicated in the following tables) were within normal limits.  All left vs. right side differences were within normal limits.    All examined muscles (as indicated in the following table) showed no evidence of electrical instability.    Impression: Essentially NORMAL electrodiagnostic study of the right upper limb.  There is no significant electrodiagnostic evidence of nerve entrapment, brachial plexopathy or cervical radiculopathy.    As you know, purely sensory or demyelinating radiculopathies and chemical radiculitis may not be detected with this particular electrodiagnostic study.  Recommendations: 1.  Follow-up with referring physician. 2.  Continue current management of symptoms.  ___________________________ Laurence Spates FAAPMR Board Certified, American Board of Physical Medicine and Rehabilitation    Nerve Conduction Studies Anti Sensory Summary Table   Stim Site NR Peak (ms) Norm Peak (ms) P-T Amp (V) Norm P-T Amp Site1 Site2 Delta-P (ms) Dist (cm) Vel (m/s) Norm Vel (m/s)  Left Median Acr Palm Anti Sensory (2nd Digit)  30.1C  Wrist    3.1 <3.6 30.7 >10 Wrist Palm 1.4 0.0    Palm    1.7 <2.0 35.5         Right Median Acr Palm Anti Sensory (2nd Digit)  31.9C  Wrist    3.4 <3.6 16.1 >10 Wrist Palm 1.6 0.0    Palm    1.8 <2.0 24.8         Right Radial Anti Sensory (Base 1st Digit)  31.6C  Wrist    2.1 <3.1 41.8  Wrist Base 1st Digit 2.1 0.0    Right Ulnar Anti Sensory (5th Digit)  32C  Wrist    3.1 <3.7 19.0 >15.0 Wrist 5th Digit 3.1 14.0 45 >38   Motor Summary Table   Stim Site NR Onset (ms) Norm Onset (ms) O-P Amp (mV) Norm O-P Amp Site1 Site2 Delta-0 (ms) Dist (cm) Vel (m/s) Norm Vel (m/s)  Left Median Motor (Abd Poll Brev)  30.5C  Wrist    2.9 <4.2 6.2 >5 Elbow Wrist 3.9 19.5 50 >50  Elbow    6.8  6.0         Right Median Motor (Abd Poll Brev)  31.6C  Wrist    3.3 <4.2 8.0 >5 Elbow Wrist 3.8 19.0 50 >50   Elbow    7.1  8.0         Right Ulnar Motor (Abd Dig Min)  31.7C  Wrist    2.6 <4.2 8.9 >3 B Elbow Wrist 3.2 18.0 56 >53  B Elbow    5.8  8.6  A Elbow B Elbow 1.5 10.0 67 >53  A Elbow    7.3  5.3          EMG   Side Muscle Nerve Root Ins Act Fibs Psw Amp Dur Poly Recrt Int Fraser Din Comment  Right Abd Poll Brev Median C8-T1 Nml Nml Nml Nml Nml 0 Nml Nml   Right 1stDorInt Ulnar C8-T1 Nml Nml Nml Nml Nml 0 Nml Nml   Right PronatorTeres Median C6-7 Nml Nml Nml Nml Nml 0 Nml Nml   Right Biceps Musculocut C5-6 Nml Nml Nml Nml Nml 0 Nml Nml   Right Deltoid Axillary C5-6 Nml Nml Nml Nml Nml 0 Nml Nml     Nerve Conduction Studies Anti Sensory Left/Right Comparison   Stim Site L Lat (ms) R Lat (ms) L-R Lat (ms) L Amp (V) R Amp (V) L-R Amp (%) Site1 Site2 L Vel (m/s) R Vel (m/s)  L-R Vel (m/s)  Median Acr Palm Anti Sensory (2nd Digit)  30.1C  Wrist 3.1 3.4 0.3 30.7 16.1 47.6 Wrist Palm     Palm 1.7 1.8 0.1 35.5 24.8 30.1       Radial Anti Sensory (Base 1st Digit)  31.6C  Wrist  2.1   41.8  Wrist Base 1st Digit     Ulnar Anti Sensory (5th Digit)  32C  Wrist  3.1   19.0  Wrist 5th Digit  45    Motor Left/Right Comparison   Stim Site L Lat (ms) R Lat (ms) L-R Lat (ms) L Amp (mV) R Amp (mV) L-R Amp (%) Site1 Site2 L Vel (m/s) R Vel (m/s) L-R Vel (m/s)  Median Motor (Abd Poll Brev)  30.5C  Wrist 2.9 3.3 0.4 6.2 8.0 22.5 Elbow Wrist 50 50 0  Elbow 6.8 7.1 0.3 6.0 8.0 25.0       Ulnar Motor (Abd Dig Min)  31.7C  Wrist  2.6   8.9  B Elbow Wrist  56   B Elbow  5.8   8.6  A Elbow B Elbow  67   A Elbow  7.3   5.3           Waveforms:

## 2021-03-18 ENCOUNTER — Telehealth: Payer: PPO | Admitting: Internal Medicine

## 2021-03-18 ENCOUNTER — Telehealth: Payer: Self-pay | Admitting: Radiology

## 2021-03-18 ENCOUNTER — Other Ambulatory Visit: Payer: Self-pay

## 2021-03-18 ENCOUNTER — Ambulatory Visit (INDEPENDENT_AMBULATORY_CARE_PROVIDER_SITE_OTHER): Payer: PPO | Admitting: Orthopedic Surgery

## 2021-03-18 DIAGNOSIS — M79641 Pain in right hand: Secondary | ICD-10-CM | POA: Diagnosis not present

## 2021-03-18 DIAGNOSIS — M79642 Pain in left hand: Secondary | ICD-10-CM

## 2021-03-18 NOTE — Progress Notes (Signed)
Impression: Essentially NORMAL electrodiagnostic study of the right upper limb.  There is no significant electrodiagnostic evidence of nerve entrapment, brachial plexopathy or cervical radiculopathy.     As you know, purely sensory or demyelinating radiculopathies and chemical radiculitis may not be detected with this particular electrodiagnostic study.   Recommendations: 1.  Follow-up with referring physician. 2.  Continue current management of symptoms.   ___________________________ Wonda Olds Board Certified, American Board of Physical Medicine and Rehabilitation

## 2021-03-18 NOTE — Progress Notes (Signed)
Virtual Visit via Telephone Note  I connected with [place] on 03/18/21 at 11:30 AM EDT by telephone and verified that I am speaking with the correct person using two identifiers.  Location: Patient: HOME Provider: OFFICE    I discussed the limitations, risks, security and privacy concerns of performing an evaluation and management service by telephone and the availability of in person appointments. I also discussed with the patient that there may be a patient responsible charge related to this service. The patient expressed understanding and agreed to proceed.   History of Present Illness: [name] is a 60 year old female who had a nerve conduction study which did not show any nerve compression in the left upper extremity she can still complains of the hand pain   Noted previously on the previous visit:  This is a 60 year old female presents with 51-monthhistory of pain in her right thumb and electric sensation shooting into her fingers associated with waking up in the morning with the hands feeling tight difficulty closing the fist.  Although symptoms are in both upper extremities the right hand is worse than the left and the right thumb seems to have pain in the base of the joint [facility].  Review of systems tiredness ringing of the ears light sensitivity joint pain depression nervousness no sensory changes just the electric shooting pain   Observations/Objective:  EMG & NCV Findings: All nerve conduction studies (as indicated in the following tables) were within normal limits.  All left vs. right side differences were within normal limits.     All examined muscles (as indicated in the following table) showed no evidence of electrical instability.     Impression: Essentially NORMAL electrodiagnostic study of the right upper limb.  There is no significant electrodiagnostic evidence of nerve entrapment, brachial plexopathy or cervical radiculopathy.     As you know, purely  sensory or demyelinating radiculopathies and chemical radiculitis may not be detected with this particular electrodiagnostic study. Assessment and Plan: The patient's diagnosis is unclear  I gave her a couple of options 1 is to see the neurosurgeon about following up on the fusion as she could have disease above or below the fusion that could cause bilateral hand pain numbness and tingling versus seeing a hand specialist for reevaluation  Follow Up Instructions:  She agrees to have the hand consult.   I discussed the assessment and treatment plan with the patient. The patient was provided an opportunity to ask questions and all were answered. The patient agreed with the plan and demonstrated an understanding of the instructions.   The patient was advised to call back or seek an in-person evaluation if the symptoms worsen or if the condition fails to improve as anticipated.  I provided 15 minutes of non-face-to-face time during this encounter.   SArther Abbott MD

## 2021-03-18 NOTE — Telephone Encounter (Signed)
-----   Message from Carole Civil, MD sent at 03/18/2021 12:04 PM EDT ----- [place]

## 2021-03-22 ENCOUNTER — Other Ambulatory Visit: Payer: Self-pay | Admitting: Family Medicine

## 2021-03-22 DIAGNOSIS — J45909 Unspecified asthma, uncomplicated: Secondary | ICD-10-CM

## 2021-03-23 ENCOUNTER — Other Ambulatory Visit: Payer: Self-pay

## 2021-03-23 ENCOUNTER — Ambulatory Visit: Payer: PPO | Admitting: Orthopedic Surgery

## 2021-03-23 ENCOUNTER — Encounter: Payer: Self-pay | Admitting: Internal Medicine

## 2021-03-23 ENCOUNTER — Encounter: Payer: Self-pay | Admitting: Orthopedic Surgery

## 2021-03-23 DIAGNOSIS — M79641 Pain in right hand: Secondary | ICD-10-CM | POA: Diagnosis not present

## 2021-03-23 NOTE — Progress Notes (Signed)
Office Visit Note   Patient: [name]           Date of Birth: 1961-05-24           MRN: [id number] Visit Date: 03/23/2021              Requested by: Carole Civil, MD [address] [place],  [place] [address] PCP: Lindell Spar, MD   Assessment & Plan: Visit Diagnoses:  1. Pain in right hand     Plan: At this point, I will have a great examination for why her right hand hurts so bad.  She had an EMG on 9/13 which was normal.  X-rays show some mild osteoarthritis at the thumb MP joint but minimal degenerative changes at the [facility] joint.  She describes pain over every joint in her hand including her wrist.  X-rays of the radiocarpal joint show minimal to no degenerative changes.  She is very active doing yard work and works with her hands all day long.  We discussed continued anti-inflammatories, bracing, and activity modification with decreased yardwork.  We will see her back in a month or 2 if she is not improving.  Follow-Up Instructions: No follow-ups on file.   Orders:  No orders of the defined types were placed in this encounter.  No orders of the defined types were placed in this encounter.     Procedures: No procedures performed   Clinical Data: No additional findings.   Subjective: Chief Complaint  Patient presents with   Right Hand - Pain    Right hand Dom, + burning, -, n/t, + weakness and swelling, hurts to grip first thing in the morning, been doing a lot of yard work, Land and other tools, Right worse than Left, pain is 8/10,    Left Hand - Pain    This is a 60 year old right-hand-dominant female who presents with diffuse right hand pain.  This started about 2 months ago.  She has been doing a lot of yard work with heavy gripping.  She notes that she mows the grass, uses a weed eater, mulches, and picks mushrooms nearly every day.  She has trouble with grip first in the morning but also has pain at the end of the day after being  active.  She describes pain in multiple joints including her radiocarpal joint, thumb CMC, thumb MP, and multiple IP joints.  She was worked up for rheumatoid arthritis which was negative.  She describes a electrical type sensation that goes from her fingertips up to her elbow.  She has no nocturnal symptoms.  She had a previous cervical fusion but denies any ongoing neck pain.  She has some tingling behind her shoulder blades but no specific radiculopathy.  She had an EMG on 9/13 which was negative.  She has worn copper fit gloves with no symptom improvement.  She has used multiple creams.  She is taking diclofenac.   Review of Systems  Constitutional: Negative.   Respiratory: Negative.    Cardiovascular: Negative.   Skin: Negative.   Neurological: Negative.     Objective: Vital Signs: There were no vitals taken for this visit.  Physical Exam Constitutional:      Appearance: Normal appearance.  Cardiovascular:     Rate and Rhythm: Normal rate.     Pulses: Normal pulses.  Pulmonary:     Effort: Pulmonary effort is normal.  Skin:    General: Skin is warm and dry.  Capillary Refill: Capillary refill takes less than 2 seconds.  Neurological:     Mental Status: She is alert.    Right Hand Exam   Tenderness  Right hand tenderness location: TTP over thumb CMC and MP joints, TTP along volar and dorsal wrist, TTP at midforearm, TTP in palm along MP joints.  Range of Motion  The patient has normal right wrist ROM.   Muscle Strength  Wrist extension: 5/5  Wrist flexion: 5/5  Grip: 5/5   Tests  Phalen's Sign: negative Tinel's sign (median nerve): negative  Other  Erythema: absent Sensation: normal Pulse: present  Comments:  - Tinel and CT compression tests, 2PD <57m in all fingers, negative CMC grind test, 5/5 FDI strength, 5/5 FDP to SF/RF, - Tinel at elbow     Specialty Comments:  No specialty comments available.  Imaging: Prior Xrs of her right hand are reviewed  by me.  She has some mild degnerative changes at the thumb MP joint.  She has no apparent joint space narrowing at the thumb CMCJ.  There are no degenerative changes at the radiocarpal joint.    PMFS History: Patient Active Problem List   Diagnosis Date Noted   Pain in right hand 03/23/2021   Stage 3a chronic kidney disease ([place] 03/16/2021   Polypharmacy 03/09/2021   PND (post-nasal drip) 03/09/2021   Breast pain 03/02/2021   Arthritis 01/31/2021   Fatty liver 01/31/2021   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   History of hemorrhagic stroke with residual hemiplegia ([facility] 06/15/2020   Urinary incontinence 06/15/2020   Asthma due to seasonal allergies 06/15/2020   Vaginal itching 03/16/2020   S/P right knee arthroscopy 07/13/17 07/20/2017   Derangement of posterior horn of medial meniscus of right knee    Chondromalacia, patella, right    IBS (irritable bowel syndrome) 05/11/2015   Gait instability 01/06/2015   Dyspareunia, female 12/11/2014   DDD (degenerative disc disease), lumbar 02/18/2014   Elevated levels of transaminase & lactic acid dehydrogenase 12/16/2013   Cerebral infarction ([facility] 11/01/2013   Postmenopausal atrophic vaginitis 11/01/2013   Lipoma of abdominal wall 08/14/2013   Back pain 05/29/2013   Paresthesia of foot 02/12/2013   Hypertension    GERD (gastroesophageal reflux disease)    Depression 11/17/2011   Insomnia 11/17/2011   Mixed hyperlipidemia 09/20/2011   Anxiety 09/20/2011   Past Medical History:  Diagnosis Date   Allergy    grass, dust , mold   Anxiety    Arthritis    Asthma due to seasonal allergies 06/15/2020   Bipolar disorder ([place]    Cancer ([facility]    cervical cancer   Carpal tunnel syndrome    Bilateral   Chest pain 09/2011   Cardiac cath-normal coronaries   Constipation    Depression    Difficulty urinating 05/31/2013   Elevated LFTs 12/16/2013   GERD (gastroesophageal reflux disease)    History of kidney stones    Hyperlipemia     Hyperlipidemia    Hypertension    Mild; provoked by stress and anxiety   IBS (irritable bowel syndrome)    Intracerebral bleed ([facility]    No aneurysm; followed by Dr. NSherwood Gambler  Loss of weight 01/06/2015   Osteoporosis    Stage 3a chronic kidney disease ([facility] 03/16/2021   Stroke ([facility] 1999   hemorrhagic stroke; weakness of left side    Family History  Problem Relation Age of Onset   Cancer Mother        breast  Hypertension Mother    Hyperlipidemia Mother    Depression Mother    Anxiety disorder Mother    COPD Mother    Arthritis Mother        rheumatoid   Drug abuse Sister    Coronary artery disease Paternal Grandfather    Coronary artery disease Paternal Uncle    Depression Cousin    Drug abuse Cousin     Past Surgical History:  Procedure Laterality Date   ABDOMINAL HYSTERECTOMY     "cancer cells"   [place]   to remove blood clot after stroke    CARDIAC CATHETERIZATION  2016   CERVICAL FUSION     CHOLECYSTECTOMY N/A 10/14/2014   Procedure: LAPAROSCOPIC CHOLECYSTECTOMY WITH INTRAOPERATIVE CHOLANGIOGRAM;  Surgeon: Jackolyn Confer, MD;  Location: [facility];  Service: General;  Laterality: N/A;   CHONDROPLASTY Right 07/13/2017   Procedure: CHONDROPLASTY of patella;  Surgeon: Carole Civil, MD;  Location[facility];  Service: Orthopedics;  Laterality: Right;   EUS N/A 08/21/2015   Procedure: ESOPHAGEAL ENDOSCOPIC ULTRASOUND (EUS) RADIAL;  Surgeon: Carol Ada, MD;  Location: [facility];  Service: Endoscopy;  Laterality: N/A;   KNEE ARTHROSCOPY WITH MEDIAL MENISECTOMY Right 07/13/2017   Procedure: KNEE ARTHROSCOPY WITH PARTIAL MEDIAL MENISECTOMY;  Surgeon: Carole Civil, MD;  Location: [facility];  Service: Orthopedics;  Laterality: Right;   LEFT HEART CATHETERIZATION WITH CORONARY ANGIOGRAM N/A 09/23/2011   Procedure: LEFT HEART CATHETERIZATION WITH CORONARY ANGIOGRAM;  Surgeon: Thayer Headings, MD;  Location: [facility] LAB;  Service: Cardiovascular;  Laterality: N/A;    LIPOMA EXCISION Left 11/18/2013   Procedure: EXCISION OF SOFT TISSUE MASS-LEFT THIGH;  Surgeon: Jamesetta So, MD;  Location: [facility];  Service: General;  Laterality: Left;   RECTOCELE REPAIR     x2   RECTOCELE REPAIR N/A 04/04/2017   Procedure: POSTERIOR REPAIR (RECTOCELE);  Surgeon: Jonnie Kind, MD;  Location: [facility];  Service: Gynecology;  Laterality: N/A;   Social History   Occupational History   Occupation: unemployed    Comment: pending disability  Tobacco Use   Smoking status: Former    Packs/day: 1.00    Years: 19.00    Pack years: 19.00    Types: Cigarettes    Quit date: 09/01/1997    Years since quitting: 23.5   Smokeless tobacco: Never   Tobacco comments:    Quit smoking 1999 , previous 20 pack years  Vaping Use   Vaping Use: Never used  Substance and Sexual Activity   Alcohol use: Yes    Comment: 1 drink every other week   Drug use: No   Sexual activity: Not Currently    Partners: Male    Birth control/protection: Surgical    Comment: hyst

## 2021-03-24 ENCOUNTER — Ambulatory Visit: Payer: PPO | Admitting: Pharmacist

## 2021-03-24 ENCOUNTER — Telehealth: Payer: Self-pay | Admitting: Orthopedic Surgery

## 2021-03-24 DIAGNOSIS — F419 Anxiety disorder, unspecified: Secondary | ICD-10-CM

## 2021-03-24 DIAGNOSIS — N1831 Chronic kidney disease, stage 3a: Secondary | ICD-10-CM

## 2021-03-24 DIAGNOSIS — J45909 Unspecified asthma, uncomplicated: Secondary | ICD-10-CM

## 2021-03-24 DIAGNOSIS — F32A Depression, unspecified: Secondary | ICD-10-CM

## 2021-03-24 DIAGNOSIS — I10 Essential (primary) hypertension: Secondary | ICD-10-CM

## 2021-03-24 DIAGNOSIS — E782 Mixed hyperlipidemia: Secondary | ICD-10-CM

## 2021-03-24 NOTE — Chronic Care Management (AMB) (Signed)
Chronic Care Management Pharmacy Note  03/24/2021 Name:  [name] MRN:  [id number] DOB:  [birth year]  Summary: Patient's most acute concern at this time is related to her hands (potentially osteoarthritis). Instructed her to call her hand specialist to answer her questions related to this.  Patient increased atorvastatin dose to 40 mg by mouth daily as instructed by PCP on 03/16/21 Re-check lipid panel in 4-12 weeks. Consider adding ezetimibe 10 mg by mouth daily if LDL remains elevated above goal Will consider adding SGLT2 inhibitor for chronic kidney disease if GFR remains <60 mL/min after next set of labs Will follow-up with patient after next lab work in [place]. No other acute concerns for me to address at this time.   Subjective: [name] is an 60 y.o. year old female who is a primary patient of Lindell Spar, MD.  The CCM team was consulted for assistance with disease management and care coordination needs.    Engaged with patient by telephone for follow up visit in response to provider referral for pharmacy case management and/or care coordination services.   Consent to Services:  The patient was given information about Chronic Care Management services, agreed to services, and gave verbal consent prior to initiation of services.  Please see initial visit note for detailed documentation.   Patient Care Team: Lindell Spar, MD as PCP - General (Internal Medicine) Beryle Lathe, [facility] (Pharmacist)  Objective:  Lab Results  Component Value Date   CREATININE 1.17 (H) 01/29/2021   CREATININE 1.07 (H) 08/31/2020   CREATININE 0.88 07/10/2017    Lab Results  Component Value Date   HGBA1C 5.3 06/02/2017   Last diabetic Eye exam: No results found for: HMDIABEYEEXA  Last diabetic Foot exam: No results found for: HMDIABFOOTEX      Component Value Date/Time   CHOL 210 (H) 08/31/2020 0839   TRIG 235 (H) 08/31/2020 0839   HDL 65 08/31/2020 0839    CHOLHDL 3.2 08/31/2020 0839   CHOLHDL 2.3 09/16/2015 0939   VLDL 26 09/16/2015 0939   LDLCALC 105 (H) 08/31/2020 0839    Hepatic Function Latest Ref Rng & Units 01/29/2021 08/31/2020 06/02/2017  Total Protein 6.0 - 8.5 g/dL 7.0 6.9 7.4  Albumin 3.8 - 4.9 g/dL 4.6 4.4 5.1  AST 0 - 40 IU/L 27 38 19  ALT 0 - 32 IU/L 48(H) 60(H) 17  Alk Phosphatase 44 - 121 IU/L 69 77 80  Total Bilirubin 0.0 - 1.2 mg/dL 0.6 0.5 0.9    Lab Results  Component Value Date/Time   TSH 1.770 08/31/2020 08:39 AM   TSH 1.438 09/20/2011 08:20 PM   FREET4 1.11 08/31/2020 08:39 AM    CBC Latest Ref Rng & Units 08/31/2020 07/10/2017 06/02/2017  WBC 3.4 - 10.8 x10E3/uL 5.7 7.5 10.7  Hemoglobin 11.1 - 15.9 g/dL 13.5 13.3 14.9  Hematocrit 34.0 - 46.6 % 39.9 39.9 45.3  Platelets 150 - 450 x10E3/uL 289 304 320    Lab Results  Component Value Date/Time   VD25OH 41.6 08/31/2020 08:39 AM   VD25OH 36.8 06/02/2017 09:22 AM    Clinical ASCVD:  Yes (stroke) The 10-year ASCVD risk score (Arnett DK, et al., 2019) is: 4.5%   Values used to calculate the score:     Age: 73 years     Sex: Female     Is Non-Hispanic African American: No     Diabetic: No     Tobacco smoker: No  Systolic Blood Pressure: 128 mmHg     Is BP treated: Yes     HDL Cholesterol: 65 mg/dL     Total Cholesterol: 210 mg/dL    Social History   Tobacco Use  Smoking Status Former   Packs/day: 1.00   Years: 19.00   Pack years: 19.00   Types: Cigarettes   Quit date: 09/01/1997   Years since quitting: 23.5  Smokeless Tobacco Never  Tobacco Comments   Quit smoking 1999 , previous 20 pack years   BP Readings from Last 3 Encounters:  03/09/21 (!) 149/82  03/02/21 128/85  02/22/21 137/87   Pulse Readings from Last 3 Encounters:  03/09/21 81  03/02/21 77  02/22/21 85   Wt Readings from Last 3 Encounters:  03/09/21 141 lb (64 kg)  03/02/21 141 lb (64 kg)  02/22/21 140 lb (63.5 kg)    Assessment: Review of patient past medical  history, allergies, medications, health status, including review of consultants reports, laboratory and other test data, was performed as part of comprehensive evaluation and provision of chronic care management services.   SDOH:  (Social Determinants of Health) assessments and interventions performed:    CCM Care Plan  Allergies  Allergen Reactions   Morphine And Related Hives   Promethazine Hcl Other (See Comments)    Causes patient to become Hyper    Medications Reviewed Today     Reviewed by Beryle Lathe, [facility] (Pharmacist) on 03/24/21 at 1126  Med List Status: <None>   Medication Order Taking? Sig Documenting Provider Last Dose Status Informant  acetaminophen (TYLENOL) 500 MG tablet [id number] Yes Take 500-1,000 mg by mouth every 6 (six) hours as needed for moderate pain. [provider] Taking Active Self  ALPRAZolam Duanne Moron) 1 MG tablet [id number] Yes Take 1 tablet (1 mg total) by mouth 3 (three) times daily.  Patient taking differently: Take 1 mg by mouth 2 (two) times daily.   Cloria Spring, MD Taking Active   amLODipine (NORVASC) 5 MG tablet [id number] Yes Take 1 tablet (5 mg total) by mouth daily. Noreene Larsson, NP Taking Active   amphetamine-dextroamphetamine (ADDERALL) 20 MG tablet [id number] Yes Take 1 tablet (20 mg total) by mouth 2 (two) times daily. Fill after 02/28/2021 Cloria Spring, MD Taking Active   atorvastatin (LIPITOR) 40 MG tablet [id number] Yes Take 1 tablet (40 mg total) by mouth daily. Lindell Spar, MD Taking Active   Black Cohosh 40 MG CAPS [id number] Yes Take 1 capsule by mouth daily. [provider] Taking Active   Calcium 500-125 MG-UNIT TABS [id number] Yes Take 1 tablet by mouth daily with supper. [provider] Taking Active Self  cimetidine (TAGAMET) 200 MG tablet [id number] Yes Take 200 mg by mouth daily as needed. [provider] Taking Active   diclofenac (CATAFLAM) 50 MG tablet [id number] Yes Take 1 tablet  (50 mg total) by mouth 2 (two) times daily. Carole Civil, MD Taking Active   docusate sodium (COLACE) 100 MG capsule [id number] Yes Take 100 mg by mouth daily. [provider] Taking Active   fexofenadine (ALLEGRA) 180 MG tablet [id number] Yes Take 180 mg by mouth daily. [provider] Taking Active   fluticasone (FLONASE) 50 MCG/ACT nasal spray [id number] Yes Place 2 sprays into both nostrils daily. [provider] Taking Active   lisinopril (ZESTRIL) 20 MG tablet [id number] Yes Take 1 tablet (20 mg total) by mouth 2 (two) times daily. Lindell Spar, MD Taking Active   lubiprostone (  AMITIZA) 8 MCG capsule [id number] Yes TAKE 1 CAPSULE TWICE DAILY WITH MEALS. Lindell Spar, MD Taking Active   montelukast (SINGULAIR) 10 MG tablet [id number] Yes TAKE (1) TABLET BY MOUTH AT BEDTIME. Lindell Spar, MD Taking Active   Multiple Vitamin [facility] WITH MINERALS) TABS [id number] Yes Take 1 tablet by mouth daily with breakfast. [provider] Taking Active Self  nabumetone (RELAFEN) 500 MG tablet [id number] Yes Take 500 mg by mouth every 8 (eight) hours as needed (joint pain). [provider] Taking Active   nystatin (MYCOSTATIN/NYSTOP) powder [id number] Yes Apply 1 application topically 3 (three) times daily.  Patient taking differently: Apply 1 application topically 3 (three) times daily as needed.   Lindell Spar, MD Taking Active   omeprazole (PRILOSEC) 40 MG capsule [id number] Yes Take 1 capsule (40 mg total) by mouth daily. Lindell Spar, MD Taking Active   oxybutynin (DITROPAN-XL) 10 MG 24 hr tablet [id number] Yes Take 1 tablet (10 mg total) by mouth at bedtime. Lindell Spar, MD Taking Active   polyethylene glycol (MIRALAX / GLYCOLAX) 17 g packet [id number] Yes Take 17 g by mouth daily. [provider] Taking Active   Probiotic Product (TRUBIOTICS PO) [id number] Yes Take 1 capsule by mouth daily. Takes 25 cfu daily. [provider] Taking Active   pyridOXINE (VITAMIN B-6) 100 MG tablet [id number] Yes Take 100 mg by mouth daily. [provider] Taking Active   traZODone (DESYREL) 100 MG tablet [id number] Yes Take 2 tablets (200 mg total) by mouth at bedtime. Cloria Spring, MD Taking Active   venlafaxine XR (EFFEXOR XR) 150 MG 24 hr capsule [id number] Yes Take 1 capsule (150 mg total) by mouth daily with breakfast. Cloria Spring, MD Taking Active             Patient Active Problem List   Diagnosis Date Noted   Pain in right hand 03/23/2021   Stage 3a chronic kidney disease ([place]) 03/16/2021   Polypharmacy 03/09/2021   PND (post-nasal drip) 03/09/2021   Breast pain 03/02/2021   Arthritis 01/31/2021   Fatty liver 01/31/2021   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   History of hemorrhagic stroke with residual hemiplegia ([place]) 06/15/2020   Urinary incontinence 06/15/2020   Asthma due to seasonal allergies 06/15/2020   Vaginal itching 03/16/2020   S/P right knee arthroscopy 07/13/17 07/20/2017   Derangement of posterior horn of medial meniscus of right knee    Chondromalacia, patella, right    IBS (irritable bowel syndrome) 05/11/2015   Gait instability 01/06/2015   Dyspareunia, female 12/11/2014   DDD (degenerative disc disease), lumbar 02/18/2014   Elevated levels of transaminase & lactic acid dehydrogenase 12/16/2013   Cerebral infarction ([place]) 11/01/2013   Postmenopausal atrophic vaginitis 11/01/2013   Lipoma of abdominal wall 08/14/2013   Back pain 05/29/2013   Paresthesia of foot 02/12/2013   Hypertension    GERD (gastroesophageal reflux disease)    Depression 11/17/2011   Insomnia 11/17/2011   Mixed hyperlipidemia 09/20/2011   Anxiety 09/20/2011    Immunization History  Administered Date(s) Administered   Influenza Split 08/13/2012, 05/11/2013   Influenza,inj,Quad PF,6+ Mos 05/11/2015, 03/24/2019   Influenza-Unspecified 05/10/2014, 04/17/2017, 03/16/2020   PFIZER(Purple Top)SARS-COV-2  Vaccination 09/26/2019, 10/19/2019, 08/12/2020   Tdap 04/30/2008, 02/12/2013    Conditions to be addressed/monitored: HTN, HLD, CKD Stage 3a, Anxiety, Depression, and asthma/allergies  Care Plan : Medication Management  Updates made by Beryle Lathe, [place] since 03/24/2021 12:00 AM     Problem: HTN,  HLD, CKD, Asthma/Allergies, Anxiety/Depression   Priority: High  Onset Date: 03/11/2021     Long-Range Goal: Disease Progression Prevention   Start Date: 03/11/2021  Expected End Date: 06/09/2021  Recent Progress: On track  Priority: High  Note:   Current Barriers:  Unable to achieve control of hyperlipidemia Suboptimal therapeutic regimen for chronic kidney disease  Pharmacist Clinical Goal(s):  Over the next 90 days, patient will Achieve control of hyperlipidemia as evidenced by improved LDL and improved triglycerides Adhere to plan to optimize therapeutic regimen for chronic kidney disease as evidenced by report of adherence to recommended medication management changes through collaboration with PharmD and provider.   Interventions: 1:1 collaboration with Lindell Spar, MD regarding development and update of comprehensive plan of care as evidenced by provider attestation and co-signature Inter-disciplinary care team collaboration (see longitudinal plan of care) Comprehensive medication review performed; medication list updated in electronic medical record  Hypertension: Blood pressure under good control. Blood pressure is at goal of <130/80 mmHg per 2017 AHA/ACC guidelines. Current medications: lisinopril 20 mg by mouth once daily and amlodipine 5 mg by mouth every evening Intolerances: none Taking medications as directed: yes Side effects thought to be attributed to current medication regimen: no Current exercise: not discussed today Current home blood pressure: 120s/70-80s per patient verbal report Continue lisinopril 20 mg by mouth once daily and amlodipine 5 mg by  mouth every evening Encourage dietary sodium restriction/DASH diet Recommend regular aerobic exercise Recommend home blood pressure monitoring to discuss at next visit Reviewed risks of hypertension, principles of treatment and consequences of untreated hypertension  Hyperlipidemia: Uncontrolled. LDL above goal of <70 due to very high risk given established clinical ASCVD (stroke in 1999) per 2020 AACE/ACE guidelines and TG above goal of <150 per 2020 AACE/ACE guidelines. Current medications: atorvastatin 40 mg by mouth once daily (increased on 03/16/21) Intolerances: none Taking medications as directed: yes Side effects thought to be attributed to current medication regimen: no Continue atorvastatin 40 mg by mouth every evening  Recommend regular aerobic exercise Reviewed risks of hyperlipidemia, principles of treatment and consequences of untreated hyperlipidemia Re-check lipid panel in 4-12 weeks. Consider adding ezetimibe 10 mg by mouth daily if LDL remains elevated above goal.   Asthma/Allergies: Controlled.  Current treatment: montelukast (Singulair) 10 mg by mouth once daily in the evening, Allegra and Flonase Patient denies the need for a rescue inhaler as she does not normally have shortness of breath unless she exerts herself more than normal Continue current medication management Recommend allergen avoidance (environmental control) Encouraged regular physical activity for general health benefits  Chronic Kidney Disease Stage 3a - GFR 45-59 (Mild to Moderately Reduced Function): Suboptimally managed Current medications:  none Intolerances:  n/a Taking medications as directed: n/a Side effects thought to be attributed to current medication regimen: n/a Most recent GFR: 54 mL/min Most recent microalbumin: unknown Recommend adequate hydration  Recommend regular aerobic exercise Discussed need for improved control of cholesterol Patient identified as a good candidate for  SGLT-2 inhibitor given reduction in cardiovascular disease, slowed chronic kidney disease progression, and blood pressure lowering. Patient denies a history of significant genitourinary infections. No concern for hypotension/volume depletion. GFR at least >20 mL/minute/1.73 m2. Per PCP, will consider adding if GFR remains <60 mL/min after next set of labs.   Anxiety/Depression: Controlled per patient Current medications: venlafaxine 150 mg by mouth daily, alprazolam 1 mg three time daily as needed for anxiety, and trazodone 200 mg by mouth at bedtime Patient reports that  she follows with a psych provider Continue current medication management   Patient Goals/Self-Care Activities Over the next 90 days, patient will:  Take medications as prescribed Check blood pressure at least once daily, document, and provide at future appointments Target a minimum of 150 minutes of moderate intensity exercise weekly  Follow Up Plan: Telephone follow up appointment with care management team member scheduled for: 05/11/21      Medication Assistance: None required.  Patient affirms current coverage meets needs.  Patient's preferred pharmacy is:  [facility], [place] [address] Phone: [phone] Fax: [phone]  Follow Up:  Patient agrees to Care Plan and Follow-up.  Plan: Telephone follow up appointment with care management team member scheduled for:  05/11/21  Kennon Holter, PharmD Clinical Pharmacist [facility] [phone]

## 2021-03-24 NOTE — Telephone Encounter (Signed)
Patient called asked if she is suppose to sleep in the brace? Patient also wanted to know if she put the Voltaren cream on her right hand can she put the brace over it?  The number to contact patient is [phone]

## 2021-03-24 NOTE — Patient Instructions (Signed)
Jazmaine L Joung,  It was great to talk to you today!  Please call me with any questions or concerns.   Visit Information  PATIENT GOALS:  Goals Addressed             This Visit's Progress    Medication Management       Patient Goals/Self-Care Activities Over the next 90 days, patient will:  Take medications as prescribed Check blood pressure at least once daily, document, and provide at future appointments Target a minimum of 150 minutes of moderate intensity exercise weekly         Patient verbalizes understanding of instructions provided today and agrees to view in [place].   Telephone follow up appointment with care management team member scheduled for:05/11/21  Kennon Holter, PharmD Clinical Pharmacist [facility] [phone]

## 2021-03-25 ENCOUNTER — Encounter: Payer: Self-pay | Admitting: Radiology

## 2021-03-25 NOTE — Telephone Encounter (Signed)
Patient aware of the below message  

## 2021-04-01 ENCOUNTER — Other Ambulatory Visit: Payer: Self-pay

## 2021-04-01 ENCOUNTER — Ambulatory Visit (INDEPENDENT_AMBULATORY_CARE_PROVIDER_SITE_OTHER): Payer: PPO | Admitting: Gastroenterology

## 2021-04-01 ENCOUNTER — Encounter (INDEPENDENT_AMBULATORY_CARE_PROVIDER_SITE_OTHER): Payer: Self-pay | Admitting: Gastroenterology

## 2021-04-01 ENCOUNTER — Encounter: Payer: Self-pay | Admitting: Internal Medicine

## 2021-04-01 VITALS — BP 123/80 | HR 81 | Temp 97.8°F | Ht 63.0 in | Wt 139.4 lb

## 2021-04-01 DIAGNOSIS — K7581 Nonalcoholic steatohepatitis (NASH): Secondary | ICD-10-CM

## 2021-04-01 DIAGNOSIS — R109 Unspecified abdominal pain: Secondary | ICD-10-CM | POA: Diagnosis not present

## 2021-04-01 DIAGNOSIS — K219 Gastro-esophageal reflux disease without esophagitis: Secondary | ICD-10-CM

## 2021-04-01 DIAGNOSIS — R1319 Other dysphagia: Secondary | ICD-10-CM

## 2021-04-01 DIAGNOSIS — K760 Fatty (change of) liver, not elsewhere classified: Secondary | ICD-10-CM | POA: Diagnosis not present

## 2021-04-01 DIAGNOSIS — K582 Mixed irritable bowel syndrome: Secondary | ICD-10-CM

## 2021-04-01 DIAGNOSIS — R1310 Dysphagia, unspecified: Secondary | ICD-10-CM | POA: Insufficient documentation

## 2021-04-01 MED ORDER — DICYCLOMINE HCL 10 MG PO CAPS: 10.0000 mg | ORAL_CAPSULE | Freq: Two times a day (BID) | ORAL | 2 refills | Status: DC | PRN

## 2021-04-01 MED ORDER — LUBIPROSTONE 24 MCG PO CAPS: 24.0000 ug | ORAL_CAPSULE | Freq: Two times a day (BID) | ORAL | 1 refills | Status: DC

## 2021-04-01 NOTE — Progress Notes (Signed)
Maylon Peppers, M.D. Gastroenterology & Hepatology [facility] [address] [place], [place] [address] Primary Care Physician: Lindell Spar, MD [address] [place] [address]  Referring MD: PCP  Chief Complaint: Chronic constipation and diarrhea, NASH  History of Present Illness: [name] is a 60 y.o. female with past medical history of bipolar disorder, cervical cancer, depression, hyperlipidemia, hypertension, IBS, stroke, CKD stage III and NASH, who presents for evaluation of chronic constipation and diarrhea, follow-up of NASH.  Patient reports that she has presented a history of constipation for at least 10 years. States that her constipation can last to up 1 week but she reports that she does take a "gentle women's laxative" which makes her move her bowels more frequently as she does not want it to get "too backed up". Patient used to take Miralax in AM and Colace at night which was useful, but noticed having some cramping with abdominal bloating in her lower abdomen (usually after eating). She reports that this is usually followed by ribbon shaped stools as well as watery bowel movements -states that these episodes of diarrhea have been happening for the last 6 months. The diarrhea can sometimes last 3-4 days and resolved on their own. Has also noticed significant amount of flatulence which is foul-smelling and has been bothering her.  Particularly, she has noticed that her symptoms get worse with diary and when she eats a lot of roughage (was advised to follow a DASHdiet but she has not been able to tolerate that much). She also reports having some R flank pain chronically, which she reports was initially considered to be secondary to gallbladder disease but even after her cholecystectomy she has not felt any improvement.  Notably, the patient reports that has been taking Amitiza 8 mcg twice a day for the last 6 months  but has not noticed any difference with this dose.  Has not tried any other laxatives besides the ones mentioned before.  Takes Imodium if she has significant diarrhea but does not take it on a frequent basis.  She also states that for the last 6 months she has presented some dysphagia to solids. She has a history of heartburn. Takes Pepcid before eating, which completely controls her GERD.  The patient denies having any nausea, vomiting, fever, chills, hematochezia, melena, hematemesis, diarrhea, jaundice, pruritus . Lost 10 lb in the last few months as she has been working out more often.  She is to follow with Dr. Carol Ada in the past for evaluation of her diarrhea episodes which underwent the work-up described below  Last EGD: 08/2018  -performed by Dr. Carol Ada for evaluation of diarrhea.  Showed presence of esophagitis (unclear extension), normal gastric and duodenal biopsies. Last Colonoscopy: 08/2018 -performed by Dr. Carol Ada.  Per report within normal limits, had biopsies of the right and left colon which were within normal limits. Last EUS 2017 - The pancreatic parenchyma was normal as well as the PD caliber. The PD was very small and difficult to measure. The CBD was normal in size (4.3 mm) and there was no evidence of any stones. No gallbladder was visualized, which is expected in her post cholecystectomy state. No other abnormalities were identified.  FHx: neg for any gastrointestinal/liver disease, mother breast cancer, ovarian cancer aunt, aunt lymphoma Social: neg smoking, alcohol or illicit drug use Surgical: cholecystectomy  Past Medical History: Past Medical History:  Diagnosis Date   Allergy  grass, dust , mold   Anxiety    Arthritis    Asthma due to seasonal allergies 06/15/2020   Bipolar disorder (HCC)    Cancer (HCC)    cervical cancer   Carpal tunnel syndrome    Bilateral   Chest pain 09/2011   Cardiac cath-normal coronaries   Constipation     Depression    Difficulty urinating 05/31/2013   Elevated LFTs 12/16/2013   GERD (gastroesophageal reflux disease)    History of kidney stones    Hyperlipemia    Hyperlipidemia    Hypertension    Mild; provoked by stress and anxiety   IBS (irritable bowel syndrome)    Intracerebral bleed ([place])    No aneurysm; followed by Dr. Sherwood Gambler   Loss of weight 01/06/2015   Osteoporosis    Stage 3a chronic kidney disease ([place]) 03/16/2021   Stroke ([place]) 1999   hemorrhagic stroke; weakness of left side    Past Surgical History: Past Surgical History:  Procedure Laterality Date   ABDOMINAL HYSTERECTOMY     "cancer cells"   [place]   to remove blood clot after stroke    CARDIAC CATHETERIZATION  2016   CERVICAL FUSION     CHOLECYSTECTOMY N/A 10/14/2014   Procedure: LAPAROSCOPIC CHOLECYSTECTOMY WITH INTRAOPERATIVE CHOLANGIOGRAM;  Surgeon: Jackolyn Confer, MD;  Location: [facility];  Service: General;  Laterality: N/A;   CHONDROPLASTY Right 07/13/2017   Procedure: CHONDROPLASTY of patella;  Surgeon: Carole Civil, MD;  Location: [facility];  Service: Orthopedics;  Laterality: Right;   EUS N/A 08/21/2015   Procedure: ESOPHAGEAL ENDOSCOPIC ULTRASOUND (EUS) RADIAL;  Surgeon: Carol Ada, MD;  Location: [facility];  Service: Endoscopy;  Laterality: N/A;   KNEE ARTHROSCOPY WITH MEDIAL MENISECTOMY Right 07/13/2017   Procedure: KNEE ARTHROSCOPY WITH PARTIAL MEDIAL MENISECTOMY;  Surgeon: Carole Civil, MD;  Location: [facility];  Service: Orthopedics;  Laterality: Right;   LEFT HEART CATHETERIZATION WITH CORONARY ANGIOGRAM N/A 09/23/2011   Procedure: LEFT HEART CATHETERIZATION WITH CORONARY ANGIOGRAM;  Surgeon: Thayer Headings, MD;  [facility];  Service: Cardiovascular;  Laterality: N/A;   LIPOMA EXCISION Left 11/18/2013   Procedure: EXCISION OF SOFT TISSUE MASS-LEFT THIGH;  Surgeon: Jamesetta So, MD;  Location: [facility];  Service: General;  Laterality: Left;   RECTOCELE REPAIR     x2    RECTOCELE REPAIR N/A 04/04/2017   Procedure: POSTERIOR REPAIR (RECTOCELE);  Surgeon: Jonnie Kind, MD;  Location: [facility];  Service: Gynecology;  Laterality: N/A;    Family History: Family History  Problem Relation Age of Onset   Cancer Mother        breast    Hypertension Mother    Hyperlipidemia Mother    Depression Mother    Anxiety disorder Mother    COPD Mother    Arthritis Mother        rheumatoid   Drug abuse Sister    Coronary artery disease Paternal Grandfather    Coronary artery disease Paternal Uncle    Depression Cousin    Drug abuse Cousin     Social History: Social History   Tobacco Use  Smoking Status Former   Packs/day: 1.00   Years: 19.00   Pack years: 19.00   Types: Cigarettes   Quit date: 09/01/1997   Years since quitting: 23.5  Smokeless Tobacco Never  Tobacco Comments   Quit smoking 1999 , previous 20 pack years   Social History   Substance and Sexual Activity  Alcohol Use Yes   Comment: 1 drink every other week   Social History   Substance and Sexual Activity  Drug Use No    Allergies: Allergies  Allergen Reactions   Morphine And Related Hives   Promethazine Hcl Other (See Comments)    Causes patient to become Hyper    Medications: Current Outpatient Medications  Medication Sig Dispense Refill   acetaminophen (TYLENOL) 500 MG tablet Take 500-1,000 mg by mouth every 6 (six) hours as needed for moderate pain.     ALPRAZolam (XANAX) 1 MG tablet Take 1 tablet (1 mg total) by mouth 3 (three) times daily. (Patient taking differently: Take 1 mg by mouth 2 (two) times daily.) 90 tablet 2   amLODipine (NORVASC) 5 MG tablet Take 1 tablet (5 mg total) by mouth daily. 90 tablet 1   amphetamine-dextroamphetamine (ADDERALL) 20 MG tablet Take 1 tablet (20 mg total) by mouth 2 (two) times daily. Fill after 02/28/2021 90 tablet 0   atorvastatin (LIPITOR) 40 MG tablet Take 1 tablet (40 mg total) by mouth daily. 90 tablet 3   Black Cohosh 40 MG CAPS  Take 1 capsule by mouth daily.     Calcium 500-125 MG-UNIT TABS Take 1 tablet by mouth daily with supper.     diclofenac (CATAFLAM) 50 MG tablet Take 1 tablet (50 mg total) by mouth 2 (two) times daily. 90 tablet 3   docusate sodium (COLACE) 100 MG capsule Take 100 mg by mouth daily.     famotidine (PEPCID) 20 MG tablet Take 20 mg by mouth daily. Patient unsure of mg     fexofenadine (ALLEGRA) 180 MG tablet Take 180 mg by mouth daily.     fluticasone (FLONASE) 50 MCG/ACT nasal spray Place 2 sprays into both nostrils daily.     lisinopril (ZESTRIL) 20 MG tablet Take 1 tablet (20 mg total) by mouth 2 (two) times daily. 180 tablet 1   lubiprostone (AMITIZA) 8 MCG capsule TAKE 1 CAPSULE TWICE DAILY WITH MEALS. 60 capsule 0   montelukast (SINGULAIR) 10 MG tablet TAKE (1) TABLET BY MOUTH AT BEDTIME. 30 tablet 0   Multiple Vitamin (MULITIVITAMIN WITH MINERALS) TABS Take 1 tablet by mouth daily with breakfast.     nabumetone (RELAFEN) 500 MG tablet Take 500 mg by mouth every 8 (eight) hours as needed (joint pain).     nystatin (MYCOSTATIN/NYSTOP) powder Apply 1 application topically 3 (three) times daily. (Patient taking differently: Apply 1 application topically 3 (three) times daily as needed.) 15 g 2   polyethylene glycol (MIRALAX / GLYCOLAX) 17 g packet Take 17 g by mouth daily.     Probiotic Product (TRUBIOTICS PO) Take 1 capsule by mouth daily. Takes 25 cfu daily.     pyridOXINE (VITAMIN B-6) 100 MG tablet Take 100 mg by mouth daily.     traZODone (DESYREL) 100 MG tablet Take 2 tablets (200 mg total) by mouth at bedtime. 60 tablet 2   venlafaxine XR (EFFEXOR XR) 150 MG 24 hr capsule Take 1 capsule (150 mg total) by mouth daily with breakfast. 90 capsule 2   No current facility-administered medications for this visit.    Review of Systems: GENERAL: negative for malaise, night sweats HEENT: No changes in hearing or vision, no nose bleeds or other nasal problems. NECK: Negative for lumps, goiter,  pain and significant neck swelling RESPIRATORY: Negative for cough, wheezing CARDIOVASCULAR: Negative for chest pain, leg swelling, palpitations, orthopnea GI: SEE HPI MUSCULOSKELETAL: Negative for joint pain or swelling, back  pain, and muscle pain. SKIN: Negative for lesions, rash PSYCH: Negative for sleep disturbance, mood disorder and recent psychosocial stressors. HEMATOLOGY Negative for prolonged bleeding, bruising easily, and swollen nodes. ENDOCRINE: Negative for cold or heat intolerance, polyuria, polydipsia and goiter. NEURO: negative for tremor, gait imbalance, syncope and seizures. The remainder of the review of systems is noncontributory.   Physical Exam: BP 123/80 (BP Location: Right Arm, Patient Position: Sitting, Cuff Size: Small)   Pulse 81   Temp 97.8 F (36.6 C) (Oral)   Ht 5' 3"  (1.6 m)   Wt 139 lb 6.4 oz (63.2 kg)   BMI 24.69 kg/m  GENERAL: The patient is AO x3, in no acute distress. HEENT: Head is normocephalic and atraumatic. EOMI are intact. Mouth is well hydrated and without lesions. NECK: Supple. No masses LUNGS: Clear to auscultation. No presence of rhonchi/wheezing/rales. Adequate chest expansion HEART: RRR, normal s1 and s2. ABDOMEN: mildly tender upon palpation of the RUQ, no guarding, no peritoneal signs, and nondistended. BS +. No masses. EXTREMITIES: Without any cyanosis, clubbing, rash, lesions or edema. NEUROLOGIC: AOx3, no focal motor deficit. SKIN: no jaundice, no rashes   Imaging/Labs: as above  I personally reviewed and interpreted the available labs, imaging and endoscopic files.  Impression and Plan: [name] is a 60 y.o. female with past medical history of bipolar disorder, cervical cancer, depression, hyperlipidemia, hypertension, IBS, stroke, CKD stage III and NASH, who presents for evaluation of chronic constipation and diarrhea, follow-up of NASH.  In terms of her chronic complaints of constipation and diarrhea, she has  presented intermittent changes in her bowel movements, predominantly constipation without improvement with over-the-counter medication.  She is currently on Amitiza at low-dose without significant improvement.  I discussed the possibility of stopping all the over-the-counter laxatives and trying single agent such as Amitiza 24 mcg twice a day dosing which she agreed.  She will also benefit of implementing a low FODMAP diet as her symptoms are likely related to IBS (had negative investigations for IBD, infectious causes or microscopic colitis).  She will also benefit from taking Bentyl as needed to decrease her abdominal complaints.  We will also evaluate her abdominal pain with an abdominal ultrasound, but also will check for celiac disease with serology. Regarding her NASH, she has presented improvement in her aminotransferases but they are still mildly elevated.  Her NAFLD score was -2.50, no further imaging is warranted at this point.  She was advised to continue healthier diet but she will benefit from the low FODMAP diet to decrease her weight even more. Finally, she is complaining of significant dysphagia, which we will evaluate further with an EGD with possible esophageal dilation based on findings.  - Explained presumed etiology of IBS symptoms. Patient was counseled about the benefit of implementing a low FODMAP to improve symptoms and recurrent episodes. A dietary list was provided to the patient. Also, the patient was counseled about the benefit of avoiding stressing situations and potential environmental triggers leading to symptomatology. - Check TTG IgA - Start Bentyl 1 tablet q12h as needed for abdominal pain - Increase Amitiza 24 mcg twice a day - Stop Miralax and docusate, other laxatives as well - Schedule abdominal  US - Explained to the patient the etiology and consequences of his current liver disease. Patient was counseled about the benefit of implementing a diet and exercise plan to  decrease at least 5% of weight. The patient understood about the importance of lifestyle changes to potentially reverse his liver  involvement. - Schedule EGD with ED  All questions were answered.      Maylon Peppers, MD Gastroenterology and Hepatology [facility]

## 2021-04-01 NOTE — H&P (View-Only) (Signed)
Maylon Peppers, M.D. Gastroenterology & Hepatology [facility] [address] [place], [place] [address] Primary Care Physician: Lindell Spar, MD [address] [place] [address]  Referring MD: PCP  Chief Complaint: Chronic constipation and diarrhea, NASH  History of Present Illness: [name] is a 60 y.o. female with past medical history of bipolar disorder, cervical cancer, depression, hyperlipidemia, hypertension, IBS, stroke, CKD stage III and NASH, who presents for evaluation of chronic constipation and diarrhea, follow-up of NASH.  Patient reports that she has presented a history of constipation for at least 10 years. States that her constipation can last to up 1 week but she reports that she does take a "gentle women's laxative" which makes her move her bowels more frequently as she does not want it to get "too backed up". Patient used to take Miralax in AM and Colace at night which was useful, but noticed having some cramping with abdominal bloating in her lower abdomen (usually after eating). She reports that this is usually followed by ribbon shaped stools as well as watery bowel movements -states that these episodes of diarrhea have been happening for the last 6 months. The diarrhea can sometimes last 3-4 days and resolved on their own. Has also noticed significant amount of flatulence which is foul-smelling and has been bothering her.  Particularly, she has noticed that her symptoms get worse with diary and when she eats a lot of roughage (was advised to follow a DASHdiet but she has not been able to tolerate that much). She also reports having some R flank pain chronically, which she reports was initially considered to be secondary to gallbladder disease but even after her cholecystectomy she has not felt any improvement.  Notably, the patient reports that has been taking Amitiza 8 mcg twice a day for the last 6 months  but has not noticed any difference with this dose.  Has not tried any other laxatives besides the ones mentioned before.  Takes Imodium if she has significant diarrhea but does not take it on a frequent basis.  She also states that for the last 6 months she has presented some dysphagia to solids. She has a history of heartburn. Takes Pepcid before eating, which completely controls her GERD.  The patient denies having any nausea, vomiting, fever, chills, hematochezia, melena, hematemesis, diarrhea, jaundice, pruritus . Lost 10 lb in the last few months as she has been working out more often.  She is to follow with Dr. Carol Ada in the past for evaluation of her diarrhea episodes which underwent the work-up described below  Last EGD: 08/2018  -performed by Dr. Carol Ada for evaluation of diarrhea.  Showed presence of esophagitis (unclear extension), normal gastric and duodenal biopsies. Last Colonoscopy: 08/2018 -performed by Dr. Carol Ada.  Per report within normal limits, had biopsies of the right and left colon which were within normal limits. Last EUS 2017 - The pancreatic parenchyma was normal as well as the PD caliber. The PD was very small and difficult to measure. The CBD was normal in size (4.3 mm) and there was no evidence of any stones. No gallbladder was visualized, which is expected in her post cholecystectomy state. No other abnormalities were identified.  FHx: neg for any gastrointestinal/liver disease, mother breast cancer, ovarian cancer aunt, aunt lymphoma Social: neg smoking, alcohol or illicit drug use Surgical: cholecystectomy  Past Medical History: Past Medical History:  Diagnosis Date   Allergy  grass, dust , mold   Anxiety    Arthritis    Asthma due to seasonal allergies 06/15/2020   Bipolar disorder (HCC)    Cancer (HCC)    cervical cancer   Carpal tunnel syndrome    Bilateral   Chest pain 09/2011   Cardiac cath-normal coronaries   Constipation     Depression    Difficulty urinating 05/31/2013   Elevated LFTs 12/16/2013   GERD (gastroesophageal reflux disease)    History of kidney stones    Hyperlipemia    Hyperlipidemia    Hypertension    Mild; provoked by stress and anxiety   IBS (irritable bowel syndrome)    Intracerebral bleed ([place])    No aneurysm; followed by Dr. Sherwood Gambler   Loss of weight 01/06/2015   Osteoporosis    Stage 3a chronic kidney disease ([place]) 03/16/2021   Stroke ([place]) 1999   hemorrhagic stroke; weakness of left side    Past Surgical History: Past Surgical History:  Procedure Laterality Date   ABDOMINAL HYSTERECTOMY     "cancer cells"   [place]   to remove blood clot after stroke    CARDIAC CATHETERIZATION  2016   CERVICAL FUSION     CHOLECYSTECTOMY N/A 10/14/2014   Procedure: LAPAROSCOPIC CHOLECYSTECTOMY WITH INTRAOPERATIVE CHOLANGIOGRAM;  Surgeon: Jackolyn Confer, MD;  Location: [facility];  Service: General;  Laterality: N/A;   CHONDROPLASTY Right 07/13/2017   Procedure: CHONDROPLASTY of patella;  Surgeon: Carole Civil, MD;  Location: [facility];  Service: Orthopedics;  Laterality: Right;   EUS N/A 08/21/2015   Procedure: ESOPHAGEAL ENDOSCOPIC ULTRASOUND (EUS) RADIAL;  Surgeon: Carol Ada, MD;  Location: [facility];  Service: Endoscopy;  Laterality: N/A;   KNEE ARTHROSCOPY WITH MEDIAL MENISECTOMY Right 07/13/2017   Procedure: KNEE ARTHROSCOPY WITH PARTIAL MEDIAL MENISECTOMY;  Surgeon: Carole Civil, MD;  Location: [facility];  Service: Orthopedics;  Laterality: Right;   LEFT HEART CATHETERIZATION WITH CORONARY ANGIOGRAM N/A 09/23/2011   Procedure: LEFT HEART CATHETERIZATION WITH CORONARY ANGIOGRAM;  Surgeon: Thayer Headings, MD;  Location: [facility];  Service: Cardiovascular;  Laterality: N/A;   LIPOMA EXCISION Left 11/18/2013   Procedure: EXCISION OF SOFT TISSUE MASS-LEFT THIGH;  Surgeon: Jamesetta So, MD;  Location: [facility];  Service: General;  Laterality: Left;   RECTOCELE REPAIR     x2    RECTOCELE REPAIR N/A 04/04/2017   Procedure: POSTERIOR REPAIR (RECTOCELE);  Surgeon: Jonnie Kind, MD;  Location: [facility];  Service: Gynecology;  Laterality: N/A;    Family History: Family History  Problem Relation Age of Onset   Cancer Mother        breast    Hypertension Mother    Hyperlipidemia Mother    Depression Mother    Anxiety disorder Mother    COPD Mother    Arthritis Mother        rheumatoid   Drug abuse Sister    Coronary artery disease Paternal Grandfather    Coronary artery disease Paternal Uncle    Depression Cousin    Drug abuse Cousin     Social History: Social History   Tobacco Use  Smoking Status Former   Packs/day: 1.00   Years: 19.00   Pack years: 19.00   Types: Cigarettes   Quit date: 09/01/1997   Years since quitting: 23.5  Smokeless Tobacco Never  Tobacco Comments   Quit smoking 1999 , previous 20 pack years   Social History   Substance and Sexual Activity  Alcohol Use Yes   Comment: 1 drink every other week   Social History   Substance and Sexual Activity  Drug Use No    Allergies: Allergies  Allergen Reactions   Morphine And Related Hives   Promethazine Hcl Other (See Comments)    Causes patient to become Hyper    Medications: Current Outpatient Medications  Medication Sig Dispense Refill   acetaminophen (TYLENOL) 500 MG tablet Take 500-1,000 mg by mouth every 6 (six) hours as needed for moderate pain.     ALPRAZolam (XANAX) 1 MG tablet Take 1 tablet (1 mg total) by mouth 3 (three) times daily. (Patient taking differently: Take 1 mg by mouth 2 (two) times daily.) 90 tablet 2   amLODipine (NORVASC) 5 MG tablet Take 1 tablet (5 mg total) by mouth daily. 90 tablet 1   amphetamine-dextroamphetamine (ADDERALL) 20 MG tablet Take 1 tablet (20 mg total) by mouth 2 (two) times daily. Fill after 02/28/2021 90 tablet 0   atorvastatin (LIPITOR) 40 MG tablet Take 1 tablet (40 mg total) by mouth daily. 90 tablet 3   Black Cohosh 40 MG CAPS  Take 1 capsule by mouth daily.     Calcium 500-125 MG-UNIT TABS Take 1 tablet by mouth daily with supper.     diclofenac (CATAFLAM) 50 MG tablet Take 1 tablet (50 mg total) by mouth 2 (two) times daily. 90 tablet 3   docusate sodium (COLACE) 100 MG capsule Take 100 mg by mouth daily.     famotidine (PEPCID) 20 MG tablet Take 20 mg by mouth daily. Patient unsure of mg     fexofenadine (ALLEGRA) 180 MG tablet Take 180 mg by mouth daily.     fluticasone (FLONASE) 50 MCG/ACT nasal spray Place 2 sprays into both nostrils daily.     lisinopril (ZESTRIL) 20 MG tablet Take 1 tablet (20 mg total) by mouth 2 (two) times daily. 180 tablet 1   lubiprostone (AMITIZA) 8 MCG capsule TAKE 1 CAPSULE TWICE DAILY WITH MEALS. 60 capsule 0   montelukast (SINGULAIR) 10 MG tablet TAKE (1) TABLET BY MOUTH AT BEDTIME. 30 tablet 0   Multiple Vitamin (MULITIVITAMIN WITH MINERALS) TABS Take 1 tablet by mouth daily with breakfast.     nabumetone (RELAFEN) 500 MG tablet Take 500 mg by mouth every 8 (eight) hours as needed (joint pain).     nystatin (MYCOSTATIN/NYSTOP) powder Apply 1 application topically 3 (three) times daily. (Patient taking differently: Apply 1 application topically 3 (three) times daily as needed.) 15 g 2   polyethylene glycol (MIRALAX / GLYCOLAX) 17 g packet Take 17 g by mouth daily.     Probiotic Product (TRUBIOTICS PO) Take 1 capsule by mouth daily. Takes 25 cfu daily.     pyridOXINE (VITAMIN B-6) 100 MG tablet Take 100 mg by mouth daily.     traZODone (DESYREL) 100 MG tablet Take 2 tablets (200 mg total) by mouth at bedtime. 60 tablet 2   venlafaxine XR (EFFEXOR XR) 150 MG 24 hr capsule Take 1 capsule (150 mg total) by mouth daily with breakfast. 90 capsule 2   No current facility-administered medications for this visit.    Review of Systems: GENERAL: negative for malaise, night sweats HEENT: No changes in hearing or vision, no nose bleeds or other nasal problems. NECK: Negative for lumps, goiter,  pain and significant neck swelling RESPIRATORY: Negative for cough, wheezing CARDIOVASCULAR: Negative for chest pain, leg swelling, palpitations, orthopnea GI: SEE HPI MUSCULOSKELETAL: Negative for joint pain or swelling, back  pain, and muscle pain. SKIN: Negative for lesions, rash PSYCH: Negative for sleep disturbance, mood disorder and recent psychosocial stressors. HEMATOLOGY Negative for prolonged bleeding, bruising easily, and swollen nodes. ENDOCRINE: Negative for cold or heat intolerance, polyuria, polydipsia and goiter. NEURO: negative for tremor, gait imbalance, syncope and seizures. The remainder of the review of systems is noncontributory.   Physical Exam: BP 123/80 (BP Location: Right Arm, Patient Position: Sitting, Cuff Size: Small)   Pulse 81   Temp 97.8 F (36.6 C) (Oral)   Ht 5' 3"  (1.6 m)   Wt 139 lb 6.4 oz (63.2 kg)   BMI 24.69 kg/m  GENERAL: The patient is AO x3, in no acute distress. HEENT: Head is normocephalic and atraumatic. EOMI are intact. Mouth is well hydrated and without lesions. NECK: Supple. No masses LUNGS: Clear to auscultation. No presence of rhonchi/wheezing/rales. Adequate chest expansion HEART: RRR, normal s1 and s2. ABDOMEN: mildly tender upon palpation of the RUQ, no guarding, no peritoneal signs, and nondistended. BS +. No masses. EXTREMITIES: Without any cyanosis, clubbing, rash, lesions or edema. NEUROLOGIC: AOx3, no focal motor deficit. SKIN: no jaundice, no rashes   Imaging/Labs: as above  I personally reviewed and interpreted the available labs, imaging and endoscopic files.  Impression and Plan: [name] is a 60 y.o. female with past medical history of bipolar disorder, cervical cancer, depression, hyperlipidemia, hypertension, IBS, stroke, CKD stage III and NASH, who presents for evaluation of chronic constipation and diarrhea, follow-up of NASH.  In terms of her chronic complaints of constipation and diarrhea, she has  presented intermittent changes in her bowel movements, predominantly constipation without improvement with over-the-counter medication.  She is currently on Amitiza at low-dose without significant improvement.  I discussed the possibility of stopping all the over-the-counter laxatives and trying single agent such as Amitiza 24 mcg twice a day dosing which she agreed.  She will also benefit of implementing a low FODMAP diet as her symptoms are likely related to IBS (had negative investigations for IBD, infectious causes or microscopic colitis).  She will also benefit from taking Bentyl as needed to decrease her abdominal complaints.  We will also evaluate her abdominal pain with an abdominal ultrasound, but also will check for celiac disease with serology. Regarding her NASH, she has presented improvement in her aminotransferases but they are still mildly elevated.  Her NAFLD score was -2.50, no further imaging is warranted at this point.  She was advised to continue healthier diet but she will benefit from the low FODMAP diet to decrease her weight even more. Finally, she is complaining of significant dysphagia, which we will evaluate further with an EGD with possible esophageal dilation based on findings.  - Explained presumed etiology of IBS symptoms. Patient was counseled about the benefit of implementing a low FODMAP to improve symptoms and recurrent episodes. A dietary list was provided to the patient. Also, the patient was counseled about the benefit of avoiding stressing situations and potential environmental triggers leading to symptomatology. - Check TTG IgA - Start Bentyl 1 tablet q12h as needed for abdominal pain - Increase Amitiza 24 mcg twice a day - Stop Miralax and docusate, other laxatives as well - Schedule abdominal  US - Explained to the patient the etiology and consequences of his current liver disease. Patient was counseled about the benefit of implementing a diet and exercise plan to  decrease at least 5% of weight. The patient understood about the importance of lifestyle changes to potentially reverse his liver  involvement. - Schedule EGD with ED  All questions were answered.      Maylon Peppers, MD Gastroenterology and Hepatology [facility]

## 2021-04-01 NOTE — Patient Instructions (Addendum)
Explained presumed etiology of IBS symptoms. Patient was counseled about the benefit of implementing a low FODMAP to improve symptoms and recurrent episodes. A dietary list was provided to the patient. Also, the patient was counseled about the benefit of avoiding stressing situations and potential environmental triggers leading to symptomatology. Perform blood workup Start Bentyl 1 tablet q12h as needed for abdominal pain Increase Amitiza 24 mcg twice a day Stop Miralax and docusate, other laxatives as well Schedule abdominal  US Explained to the patient the etiology and consequences of his current liver disease. Patient was counseled about the benefit of implementing a diet and exercise plan to decrease at least 5% of weight. The patient understood about the importance of lifestyle changes to potentially reverse his liver involvement. Schedule EGD with ED

## 2021-04-02 ENCOUNTER — Encounter (INDEPENDENT_AMBULATORY_CARE_PROVIDER_SITE_OTHER): Payer: Self-pay

## 2021-04-02 DIAGNOSIS — E785 Hyperlipidemia, unspecified: Secondary | ICD-10-CM | POA: Diagnosis not present

## 2021-04-02 DIAGNOSIS — F32A Depression, unspecified: Secondary | ICD-10-CM

## 2021-04-02 DIAGNOSIS — N1831 Chronic kidney disease, stage 3a: Secondary | ICD-10-CM

## 2021-04-02 DIAGNOSIS — J45909 Unspecified asthma, uncomplicated: Secondary | ICD-10-CM

## 2021-04-02 DIAGNOSIS — E782 Mixed hyperlipidemia: Secondary | ICD-10-CM | POA: Diagnosis not present

## 2021-04-02 DIAGNOSIS — I10 Essential (primary) hypertension: Secondary | ICD-10-CM

## 2021-04-02 LAB — TISSUE TRANSGLUTAMINASE, IGA: (tTG) Ab, IgA: <1 U/mL

## 2021-04-02 LAB — IGA: Immunoglobulin A: 174 mg/dL (ref 47–310)

## 2021-04-08 ENCOUNTER — Ambulatory Visit: Payer: PPO | Admitting: Adult Health

## 2021-04-08 ENCOUNTER — Other Ambulatory Visit (HOSPITAL_COMMUNITY): Payer: Self-pay | Admitting: Psychiatry

## 2021-04-08 ENCOUNTER — Telehealth (HOSPITAL_COMMUNITY): Payer: Self-pay | Admitting: *Deleted

## 2021-04-08 NOTE — Telephone Encounter (Signed)
sent 

## 2021-04-08 NOTE — Telephone Encounter (Signed)
Patient called stating she is out of her adderall. Per pt her appt is scheduled 04/26/2021.

## 2021-04-09 ENCOUNTER — Telehealth (HOSPITAL_COMMUNITY): Payer: Self-pay | Admitting: *Deleted

## 2021-04-09 ENCOUNTER — Other Ambulatory Visit: Payer: Self-pay

## 2021-04-09 ENCOUNTER — Ambulatory Visit (HOSPITAL_COMMUNITY)
Admission: RE | Admit: 2021-04-09 | Discharge: 2021-04-09 | Disposition: A | Discharge status: Home / Self Care | Payer: PPO | Source: Ambulatory Visit | Attending: Gastroenterology | Admitting: Gastroenterology

## 2021-04-09 DIAGNOSIS — R109 Unspecified abdominal pain: Secondary | ICD-10-CM | POA: Insufficient documentation

## 2021-04-09 DIAGNOSIS — K760 Fatty (change of) liver, not elsewhere classified: Secondary | ICD-10-CM | POA: Diagnosis not present

## 2021-04-09 DIAGNOSIS — N200 Calculus of kidney: Secondary | ICD-10-CM | POA: Diagnosis not present

## 2021-04-09 NOTE — Telephone Encounter (Signed)
Informed patient

## 2021-04-09 NOTE — Telephone Encounter (Signed)
Patient called stating she called her pharmacy to get her medication filled.   Per pt, the pharmacy told her she picked up her medication on September 28 th. Per pt, she knows she did not pick up her medication.   Per pt to give permission for pharmacy to fill her medication due to her being completely out. Per pt the bottle she have says her last pick up is 02-2021.   Staff asked patient if she tried looking around to see if she may have misplaced it. Patient stated she know she didn't because she have a routine with her medications. So she knows she don't have it.   Staff called [employer] and spoke with Amy to verify information. Per Amy, she looked in their records and they have that patient picked up her medication on 04-01-2021 at 11:36 am.  Staff informed patient with what Amy stated. Per patient she is looking at her bank statement and she don't see anything with [employer].   Patient then stated she would still like staff to send message to provider to see what provider says.  Staff did let patient know that due to it being last week, even if Dr. Harrington Challenger do approve for the pharmacy to fill, her insurance will not pay for it.   Pt verbalized understanding and stated she would still like for staff to send message to provider and for staff to call her back with response unless provider wants to call her back.

## 2021-04-09 NOTE — Telephone Encounter (Signed)
Patient just called back and stating to staff to not worry about it. Patient did not elaborate.

## 2021-04-11 ENCOUNTER — Encounter (INDEPENDENT_AMBULATORY_CARE_PROVIDER_SITE_OTHER): Payer: Self-pay

## 2021-04-12 ENCOUNTER — Encounter: Payer: Self-pay | Admitting: Adult Health

## 2021-04-12 ENCOUNTER — Ambulatory Visit: Payer: PPO | Admitting: Internal Medicine

## 2021-04-12 ENCOUNTER — Ambulatory Visit: Payer: PPO | Admitting: Adult Health

## 2021-04-12 ENCOUNTER — Other Ambulatory Visit: Payer: Self-pay

## 2021-04-12 VITALS — BP 123/79 | HR 75 | Ht 63.0 in | Wt 140.0 lb

## 2021-04-12 DIAGNOSIS — R102 Pelvic and perineal pain: Secondary | ICD-10-CM | POA: Diagnosis not present

## 2021-04-12 DIAGNOSIS — N941 Unspecified dyspareunia: Secondary | ICD-10-CM

## 2021-04-12 NOTE — Progress Notes (Signed)
  Subjective:     Patient ID: [name], female   DOB: 10/27/1960, 60 y.o.   MRN: [id number]  HPI [name] is a 60 year old white female,married, sp hysterectomy in wanting to be checked, for ?rectocele recurrence, she has pain with sex and low pelvic pain. She is using luvena and astroglide, she has had a stroke. She is seeing GI and has fatty liver, trying lose weight. PCP is Dr Posey Pronto.  Review of Systems +Pelvic pain +Pain with sex Reviewed past medical,surgical, social and family history. Reviewed medications and allergies.     Objective:   Physical Exam BP 123/79 (BP Location: Right Arm, Patient Position: Sitting, Cuff Size: Normal)   Pulse 75   Ht 5' 3"  (1.6 m)   Wt 140 lb (63.5 kg)   BMI 24.80 kg/m     Skin warm and dry.Pelvic: external genitalia is normal in appearance no lesions, vagina: pale with loss of rugae,urethra has no lesions or masses noted, cervix and uterus are absent. adnexa: no masses,mild tenderness noted. Bladder is non tender and no masses felt. On rectal has good tone, no masses or rectocele. Fall risk is low  Upstream - 04/12/21 1502       Pregnancy Intention Screening   Does the patient want to become pregnant in the next year? N/A    Does the patient's partner want to become pregnant in the next year? N/A    Would the patient like to discuss contraceptive options today? N/A      Contraception Wrap Up   Current Method Female Sterilization   hyst   End Method Female Sterilization   hyst   Contraception Counseling Provided No            Examination chaperoned by Engineer, materials Assessment:     1. Dyspareunia, female Continue luvena and astroglide  2. Pelvic pain Will get pelvic US at [facility] 05/24/21 at 3:30 pm her request end of November    Plan:     Follow up prn

## 2021-04-12 NOTE — Patient Instructions (Signed)
[name]  04/12/2021     @PREFPERIOPPHARMACY @   Your procedure is scheduled on  04/16/2021.   Report to [name] at  1130  A.M.   Call this number if you have problems the morning of surgery:  [phone]   Remember:  Follow the diet and prep instructions given to you by the office.    Take these medicines the morning of surgery with A SIP OF WATER         xanax (if needed), amlodipine, adderall, diclofenac, pepcid, allegra, relafen (if needed), effexor.     Do not wear jewelry, make-up or nail polish.  Do not wear lotions, powders, or perfumes, or deodorant.  Do not shave 48 hours prior to surgery.  Men may shave face and neck.  Do not bring valuables to the hospital.  [facility] is not responsible for any belongings or valuables.  Contacts, dentures or bridgework may not be worn into surgery.  Leave your suitcase in the car.  After surgery it may be brought to your room.  For patients admitted to the hospital, discharge time will be determined by your treatment team.  Patients discharged the day of surgery will not be allowed to drive home and must have someone with them for 24 hours.    Special instructions:   DO NOT smoke tobacco or vape for 24 hours before your procedure.  Please read over the following fact sheets that you were given. Anesthesia Post-op Instructions and Care and Recovery After Surgery      Esophageal Dilatation Esophageal dilatation, also called esophageal dilation, is a procedure to widen or open a blocked or narrowed part of the esophagus. The esophagus is the part of the body that moves food and liquid from the mouth to the stomach. You may need this procedure if: You have a buildup of scar tissue in your esophagus that makes it difficult, painful, or impossible to swallow. This can be caused by gastroesophageal reflux disease (GERD). You have cancer of the esophagus. There is a problem with how food moves through your  esophagus. In some cases, you may need this procedure repeated at a later time to dilate the esophagus gradually. Tell a health care provider about: Any allergies you have. All medicines you are taking, including vitamins, herbs, eye drops, creams, and over-the-counter medicines. Any problems you or family members have had with anesthetic medicines. Any blood disorders you have. Any surgeries you have had. Any medical conditions you have. Any antibiotic medicines you are required to take before dental procedures. Whether you are pregnant or may be pregnant. What are the risks? Generally, this is a safe procedure. However, problems may occur, including: Bleeding due to a tear in the lining of the esophagus. A hole, or perforation, in the esophagus. What happens before the procedure? Ask your health care provider about: Changing or stopping your regular medicines. This is especially important if you are taking diabetes medicines or blood thinners. Taking medicines such as aspirin and ibuprofen. These medicines can thin your blood. Do not take these medicines unless your health care provider tells you to take them. Taking over-the-counter medicines, vitamins, herbs, and supplements. Follow instructions from your health care provider about eating or drinking restrictions. Plan to have a responsible adult take you home from the hospital or clinic. Plan to have a responsible adult care for you for the time you are told after you leave the hospital or clinic. This is  important. What happens during the procedure? You may be given a medicine to help you relax (sedative). A numbing medicine may be sprayed into the back of your throat, or you may gargle the medicine. Your health care provider may perform the dilatation using various surgical instruments, such as: Simple dilators. This instrument is carefully placed in the esophagus to stretch it. Guided wire bougies. This involves using an endoscope  to insert a wire into the esophagus. A dilator is passed over this wire to enlarge the esophagus. Then the wire is removed. Balloon dilators. An endoscope with a small balloon is inserted into the esophagus. The balloon is inflated to stretch the esophagus and open it up. The procedure may vary among health care providers and hospitals. What can I expect after the procedure? Your blood pressure, heart rate, breathing rate, and blood oxygen level will be monitored until you leave the hospital or clinic. Your throat may feel slightly sore and numb. This will get better over time. You will not be allowed to eat or drink until your throat is no longer numb. When you are able to drink, urinate, and sit on the edge of the bed without nausea or dizziness, you may be able to return home. Follow these instructions at home: Take over-the-counter and prescription medicines only as told by your health care provider. If you were given a sedative during the procedure, it can affect you for several hours. Do not drive or operate machinery until your health care provider says that it is safe. Plan to have a responsible adult care for you for the time you are told. This is important. Follow instructions from your health care provider about any eating or drinking restrictions. Do not use any products that contain nicotine or tobacco, such as cigarettes, e-cigarettes, and chewing tobacco. If you need help quitting, ask your health care provider. Keep all follow-up visits. This is important. Contact a health care provider if: You have a fever. You have pain that is not relieved by medicine. Get help right away if: You have chest pain. You have trouble breathing. You have trouble swallowing. You vomit blood. You have black, tarry, or bloody stools. These symptoms may represent a serious problem that is an emergency. Do not wait to see if the symptoms will go away. Get medical help right away. Call your local  emergency services (911 in the [place] Do not drive yourself to the hospital. Summary Esophageal dilatation, also called esophageal dilation, is a procedure to widen or open a blocked or narrowed part of the esophagus. Plan to have a responsible adult take you home from the hospital or clinic. For this procedure, a numbing medicine may be sprayed into the back of your throat, or you may gargle the medicine. Do not drive or operate machinery until your health care provider says that it is safe. This information is not intended to replace advice given to you by your health care provider. Make sure you discuss any questions you have with your health care provider. Document Revised: 11/06/2019 Document Reviewed: 11/06/2019 Elsevier Patient Education  [place] After This sheet gives you information about how to care for yourself after your procedure. Your health care provider may also give you more specific instructions. If you have problems or questions, contact your health care provider. What can I expect after the procedure? After the procedure, it is common to have: Tiredness. Forgetfulness about what happened after the procedure. Impaired judgment for  important decisions. Nausea or vomiting. Some difficulty with balance. Follow these instructions at home: For the time period you were told by your health care provider:   Rest as needed. Do not participate in activities where you could fall or become injured. Do not drive or use machinery. Do not drink alcohol. Do not take sleeping pills or medicines that cause drowsiness. Do not make important decisions or sign legal documents. Do not take care of children on your own. Eating and drinking Follow the diet that is recommended by your health care provider. Drink enough fluid to keep your urine pale yellow. If you vomit: Drink water, juice, or soup when you can drink without vomiting. Make sure  you have little or no nausea before eating solid foods. General instructions Have a responsible adult stay with you for the time you are told. It is important to have someone help care for you until you are awake and alert. Take over-the-counter and prescription medicines only as told by your health care provider. If you have sleep apnea, surgery and certain medicines can increase your risk for breathing problems. Follow instructions from your health care provider about wearing your sleep device: Anytime you are sleeping, including during daytime naps. While taking prescription pain medicines, sleeping medicines, or medicines that make you drowsy. Avoid smoking. Keep all follow-up visits as told by your health care provider. This is important. Contact a health care provider if: You keep feeling nauseous or you keep vomiting. You feel light-headed. You are still sleepy or having trouble with balance after 24 hours. You develop a rash. You have a fever. You have redness or swelling around the IV site. Get help right away if: You have trouble breathing. You have new-onset confusion at home. Summary For several hours after your procedure, you may feel tired. You may also be forgetful and have poor judgment. Have a responsible adult stay with you for the time you are told. It is important to have someone help care for you until you are awake and alert. Rest as told. Do not drive or operate machinery. Do not drink alcohol or take sleeping pills. Get help right away if you have trouble breathing, or if you suddenly become confused. This information is not intended to replace advice given to you by your health care provider. Make sure you discuss any questions you have with your health care provider. Document Revised: 03/05/2020 Document Reviewed: 05/23/2019 Elsevier Patient Education  2022 [employer].

## 2021-04-14 ENCOUNTER — Other Ambulatory Visit: Payer: Self-pay

## 2021-04-14 ENCOUNTER — Encounter (HOSPITAL_COMMUNITY): Payer: Self-pay

## 2021-04-14 ENCOUNTER — Encounter (HOSPITAL_COMMUNITY)
Admission: RE | Admit: 2021-04-14 | Discharge: 2021-04-14 | Disposition: A | Discharge status: Home / Self Care | Payer: PPO | Source: Ambulatory Visit | Attending: Gastroenterology | Admitting: Gastroenterology

## 2021-04-14 DIAGNOSIS — Z01818 Encounter for other preprocedural examination: Secondary | ICD-10-CM | POA: Diagnosis not present

## 2021-04-14 LAB — CBC WITH DIFFERENTIAL/PLATELET
Abs Immature Granulocytes: 0.03 10*3/uL (ref 0.00–0.07)
Basophils Absolute: 0 10*3/uL (ref 0.0–0.1)
Basophils Relative: 0 %
Eosinophils Absolute: 0.1 10*3/uL (ref 0.0–0.5)
Eosinophils Relative: 1 %
HCT: 38.4 % (ref 36.0–46.0)
Hemoglobin: 13.2 g/dL (ref 12.0–15.0)
Immature Granulocytes: 0 %
Lymphocytes Relative: 14 %
Lymphs Abs: 1.5 10*3/uL (ref 0.7–4.0)
MCH: 34.9 pg — ABNORMAL HIGH (ref 26.0–34.0)
MCHC: 34.4 g/dL (ref 30.0–36.0)
MCV: 101.6 fL — ABNORMAL HIGH (ref 80.0–100.0)
Monocytes Absolute: 0.7 10*3/uL (ref 0.1–1.0)
Monocytes Relative: 7 %
Neutro Abs: 8.4 10*3/uL — ABNORMAL HIGH (ref 1.7–7.7)
Neutrophils Relative %: 78 %
Platelets: 313 10*3/uL (ref 150–400)
RBC: 3.78 MIL/uL — ABNORMAL LOW (ref 3.87–5.11)
RDW: 12.6 % (ref 11.5–15.5)
WBC: 10.9 10*3/uL — ABNORMAL HIGH (ref 4.0–10.5)
nRBC: 0 % (ref 0.0–0.2)

## 2021-04-14 LAB — BASIC METABOLIC PANEL
Anion gap: 8 (ref 5–15)
BUN: 24 mg/dL — ABNORMAL HIGH (ref 6–20)
CO2: 30 mmol/L (ref 22–32)
Calcium: 9.5 mg/dL (ref 8.9–10.3)
Chloride: 101 mmol/L (ref 98–111)
Creatinine, Ser: 1.16 mg/dL — ABNORMAL HIGH (ref 0.44–1.00)
GFR, Estimated: 55 mL/min — ABNORMAL LOW (ref >60)
Glucose, Bld: 112 mg/dL — ABNORMAL HIGH (ref 70–99)
Potassium: 4.1 mmol/L (ref 3.5–5.1)
Sodium: 139 mmol/L (ref 135–145)

## 2021-04-16 ENCOUNTER — Ambulatory Visit (HOSPITAL_COMMUNITY): Payer: PPO | Admitting: Anesthesiology

## 2021-04-16 ENCOUNTER — Encounter (HOSPITAL_COMMUNITY)
Admission: RE | Disposition: A | Discharge status: Home / Self Care | Payer: Self-pay | Source: Home / Self Care | Attending: Gastroenterology

## 2021-04-16 ENCOUNTER — Ambulatory Visit (HOSPITAL_COMMUNITY)
Admission: RE | Admit: 2021-04-16 | Discharge: 2021-04-16 | Disposition: A | Discharge status: Home / Self Care | Payer: PPO | Attending: Gastroenterology | Admitting: Gastroenterology

## 2021-04-16 ENCOUNTER — Encounter (HOSPITAL_COMMUNITY): Payer: Self-pay | Admitting: Gastroenterology

## 2021-04-16 DIAGNOSIS — K317 Polyp of stomach and duodenum: Secondary | ICD-10-CM | POA: Insufficient documentation

## 2021-04-16 DIAGNOSIS — Z888 Allergy status to other drugs, medicaments and biological substances status: Secondary | ICD-10-CM | POA: Insufficient documentation

## 2021-04-16 DIAGNOSIS — Z8541 Personal history of malignant neoplasm of cervix uteri: Secondary | ICD-10-CM | POA: Diagnosis not present

## 2021-04-16 DIAGNOSIS — I129 Hypertensive chronic kidney disease with stage 1 through stage 4 chronic kidney disease, or unspecified chronic kidney disease: Secondary | ICD-10-CM | POA: Insufficient documentation

## 2021-04-16 DIAGNOSIS — K5909 Other constipation: Secondary | ICD-10-CM | POA: Insufficient documentation

## 2021-04-16 DIAGNOSIS — Z8261 Family history of arthritis: Secondary | ICD-10-CM | POA: Insufficient documentation

## 2021-04-16 DIAGNOSIS — Z803 Family history of malignant neoplasm of breast: Secondary | ICD-10-CM | POA: Diagnosis not present

## 2021-04-16 DIAGNOSIS — Z87891 Personal history of nicotine dependence: Secondary | ICD-10-CM | POA: Diagnosis not present

## 2021-04-16 DIAGNOSIS — R109 Unspecified abdominal pain: Secondary | ICD-10-CM | POA: Diagnosis not present

## 2021-04-16 DIAGNOSIS — K3189 Other diseases of stomach and duodenum: Secondary | ICD-10-CM | POA: Diagnosis not present

## 2021-04-16 DIAGNOSIS — Z885 Allergy status to narcotic agent status: Secondary | ICD-10-CM | POA: Insufficient documentation

## 2021-04-16 DIAGNOSIS — E785 Hyperlipidemia, unspecified: Secondary | ICD-10-CM | POA: Insufficient documentation

## 2021-04-16 DIAGNOSIS — K319 Disease of stomach and duodenum, unspecified: Secondary | ICD-10-CM | POA: Diagnosis not present

## 2021-04-16 DIAGNOSIS — R1310 Dysphagia, unspecified: Secondary | ICD-10-CM | POA: Diagnosis not present

## 2021-04-16 DIAGNOSIS — Z791 Long term (current) use of non-steroidal anti-inflammatories (NSAID): Secondary | ICD-10-CM | POA: Insufficient documentation

## 2021-04-16 DIAGNOSIS — F319 Bipolar disorder, unspecified: Secondary | ICD-10-CM | POA: Insufficient documentation

## 2021-04-16 DIAGNOSIS — Z825 Family history of asthma and other chronic lower respiratory diseases: Secondary | ICD-10-CM | POA: Insufficient documentation

## 2021-04-16 DIAGNOSIS — Z79899 Other long term (current) drug therapy: Secondary | ICD-10-CM | POA: Insufficient documentation

## 2021-04-16 DIAGNOSIS — Z8249 Family history of ischemic heart disease and other diseases of the circulatory system: Secondary | ICD-10-CM | POA: Diagnosis not present

## 2021-04-16 DIAGNOSIS — N1831 Chronic kidney disease, stage 3a: Secondary | ICD-10-CM | POA: Insufficient documentation

## 2021-04-16 DIAGNOSIS — K7581 Nonalcoholic steatohepatitis (NASH): Secondary | ICD-10-CM | POA: Insufficient documentation

## 2021-04-16 DIAGNOSIS — K582 Mixed irritable bowel syndrome: Secondary | ICD-10-CM | POA: Insufficient documentation

## 2021-04-16 HISTORY — PX: ESOPHAGEAL DILATION: SHX303

## 2021-04-16 HISTORY — PX: ESOPHAGOGASTRODUODENOSCOPY (EGD) WITH PROPOFOL: SHX5813

## 2021-04-16 HISTORY — PX: POLYPECTOMY: SHX5525

## 2021-04-16 HISTORY — PX: BIOPSY: SHX5522

## 2021-04-16 SURGERY — ESOPHAGOGASTRODUODENOSCOPY (EGD) WITH PROPOFOL
Anesthesia: General

## 2021-04-16 MED ORDER — PROPOFOL 500 MG/50ML IV EMUL
INTRAVENOUS | Status: DC | PRN
  Administered 2021-04-16: 150 ug/kg/min via INTRAVENOUS

## 2021-04-16 MED ORDER — LACTATED RINGERS IV SOLN
INTRAVENOUS | Status: DC
  Administered 2021-04-16: 1000 mL via INTRAVENOUS

## 2021-04-16 MED ORDER — PROPOFOL 10 MG/ML IV BOLUS
INTRAVENOUS | Status: DC | PRN
  Administered 2021-04-16: 100 mg via INTRAVENOUS

## 2021-04-16 MED ORDER — OMEPRAZOLE 40 MG PO CPDR: 40.0000 mg | DELAYED_RELEASE_CAPSULE | Freq: Two times a day (BID) | ORAL | 0 refills | Status: DC

## 2021-04-16 MED ORDER — PROPOFOL 500 MG/50ML IV EMUL
INTRAVENOUS | Status: AC
  Filled 2021-04-16: qty 50

## 2021-04-16 NOTE — Interval H&P Note (Signed)
History and Physical Interval Note:  04/16/2021 10:23 AM [name] is a 60 y.o. female with past medical history of bipolar disorder, cervical cancer, depression, hyperlipidemia, hypertension, IBS, stroke, CKD stage III and NASH, who presents for evaluation of dysphagia.  BP 123/72   Pulse 76   Temp 97.6 F (36.4 C) (Oral)   Resp 19   SpO2 95%  GENERAL: The patient is AO x3, in no acute distress. HEENT: Head is normocephalic and atraumatic. EOMI are intact. Mouth is well hydrated and without lesions. NECK: Supple. No masses LUNGS: Clear to auscultation. No presence of rhonchi/wheezing/rales. Adequate chest expansion HEART: RRR, normal s1 and s2. ABDOMEN: Soft, nontender, no guarding, no peritoneal signs, and nondistended. BS +. No masses. EXTREMITIES: Without any cyanosis, clubbing, rash, lesions or edema. NEUROLOGIC: AOx3, no focal motor deficit. SKIN: no jaundice, no rashes   [name]  has presented today for surgery, with the diagnosis of Dysphagia.  The various methods of treatment have been discussed with the patient and family. After consideration of risks, benefits and other options for treatment, the patient has consented to  Procedure(s) with comments: ESOPHAGOGASTRODUODENOSCOPY (EGD) WITH PROPOFOL (N/A) - 1:35, pt knows to arrive at [facility] (N/A) as a surgical intervention.  The patient's history has been reviewed, patient examined, no change in status, stable for surgery.  I have reviewed the patient's chart and labs.  Questions were answered to the patient's satisfaction.     [name]

## 2021-04-16 NOTE — Anesthesia Postprocedure Evaluation (Signed)
Anesthesia Post Note  Patient: Madeleine L Enochs  Procedure(s) Performed: ESOPHAGOGASTRODUODENOSCOPY (EGD) WITH PROPOFOL ESOPHAGEAL DILATION BIOPSY POLYPECTOMY  Patient location during evaluation: Phase II Anesthesia Type: General Level of consciousness: awake and alert and oriented Pain management: pain level controlled Vital Signs Assessment: post-procedure vital signs reviewed and stable Respiratory status: spontaneous breathing, nonlabored ventilation and respiratory function stable Cardiovascular status: blood pressure returned to baseline and stable Postop Assessment: no apparent nausea or vomiting Anesthetic complications: no   No notable events documented.   Last Vitals:  Vitals:   04/16/21 1155 04/16/21 1202  BP: (!) 100/52 107/68  Pulse: 72 71  Resp: 16 (!) 21  Temp: 36.6 C   SpO2: 93% 94%    Last Pain:  Vitals:   04/16/21 1202  TempSrc:   PainSc: 0-No pain                 Taseen Marasigan C Solace Wendorff

## 2021-04-16 NOTE — Transfer of Care (Signed)
Immediate Anesthesia Transfer of Care Note  Patient: [name]  Procedure(s) Performed: ESOPHAGOGASTRODUODENOSCOPY (EGD) WITH PROPOFOL ESOPHAGEAL DILATION BIOPSY POLYPECTOMY  Patient Location: PACU  Anesthesia Type:General  Level of Consciousness: awake, alert , oriented and patient cooperative  Airway & Oxygen Therapy: Patient Spontanous Breathing  Post-op Assessment: Report given to RN, Post -op Vital signs reviewed and stable and Patient moving all extremities X 4  Post vital signs: Reviewed and stable  Last Vitals:  Vitals Value Taken Time  BP    Temp    Pulse    Resp    SpO2      Last Pain:  Vitals:   04/16/21 1002  TempSrc: Oral  PainSc: 6       Patients Stated Pain Goal: 7 (83/43/73 [id number])  Complications: No notable events documented.

## 2021-04-16 NOTE — Discharge Instructions (Signed)
You are being discharged to home.  Resume your previous diet.  We are waiting for your pathology results.  Take Prilosec (omeprazole) 40 mg by mouth twice a day for two weeks.

## 2021-04-16 NOTE — Op Note (Addendum)
[facility] Patient Name: [name] Procedure Date: 04/16/2021 11:11 AM MRN: [id number] Date of Birth: Sep 28, 1960 Attending MD: Maylon Peppers ,  CSN: [id number] Age: 60 Admit Type: Outpatient Procedure:                Upper GI endoscopy Indications:              Abdominal pain, Dysphagia Providers:                Maylon Peppers, Lurline Del, RN, Nelma Rothman,                            Technician Referring MD:              Medicines:                Monitored Anesthesia Care Complications:            No immediate complications. Estimated Blood Loss:     Estimated blood loss: none. Procedure:                Pre-Anesthesia Assessment:                           - Prior to the procedure, a History and Physical                            was performed, and patient medications, allergies                            and sensitivities were reviewed. The patient's                            tolerance of previous anesthesia was reviewed.                           - The risks and benefits of the procedure and the                            sedation options and risks were discussed with the                            patient. All questions were answered and informed                            consent was obtained.                           After obtaining informed consent, the endoscope was                            passed under direct vision. Throughout the                            procedure, the patient's blood pressure, pulse, and                            oxygen saturations were monitored continuously. The  [id number]-H190 ([id number]) scope was introduced through the                            mouth, and advanced to the second part of duodenum.                            The upper GI endoscopy was accomplished without                            difficulty. The patient tolerated the procedure                            well. Scope In: 11:29:02 AM Scope Out:  11:48:02 AM Total Procedure Duration: 0 hours 19 minutes 0 seconds  Findings:      No endoscopic abnormality was evident in the esophagus to explain the       patient's complaint of dysphagia. It was decided, however, to proceed       with dilation of the entire esophagus. A guidewire was placed and the       scope was withdrawn. Dilation was performed with a Savary dilator with       no resistance at 18 mm. No mucosal disruption was noted. Biopsies were       obtained from the proximal and distal esophagus with cold forceps for       histology of suspected eosinophilic esophagitis.      A few localized small erosions with no stigmata of recent bleeding were       found in the stomach. Biopsies were taken with a cold forceps for       Helicobacter pylori testing.      A single 12 mm sessile polyp with no bleeding and no stigmata of recent       bleeding was found in the gastric fundus. The polyp was removed with a       cold snare. Resection and retrieval were complete with a Roth net.      The examined duodenum was normal. Biopsies were taken with a cold       forceps for histology. Impression:               - No endoscopic esophageal abnormality to explain                            patient's dysphagia. Esophagus dilated. Dilated.                            Biopsied.                           - Erosive gastropathy with no stigmata of recent                            bleeding. Biopsied.                           - A single gastric polyp. Resected and retrieved.                           -  Normal examined duodenum. Biopsied. Moderate Sedation:      Per Anesthesia Care Recommendation:           - Discharge patient to home (ambulatory).                           - Resume previous diet.                           - Await pathology results.                           - Use Prilosec (omeprazole) 40 mg PO BID for 2                            weeks. Procedure Code(s):        --- Professional  ---                           [phone], Esophagogastroduodenoscopy, flexible,                            transoral; with removal of tumor(s), polyp(s), or                            other lesion(s) by snare technique                           43248, Esophagogastroduodenoscopy, flexible,                            transoral; with insertion of guide wire followed by                            passage of dilator(s) through esophagus over guide                            wire                           43239, 73, Esophagogastroduodenoscopy, flexible,                            transoral; with biopsy, single or multiple Diagnosis Code(s):        --- Professional ---                           R13.10, Dysphagia, unspecified                           K31.89, Other diseases of stomach and duodenum                           K31.7, Polyp of stomach and duodenum                           R10.9, Unspecified abdominal pain CPT copyright 2019 American Medical Association. All rights reserved. The codes documented  in this report are preliminary and upon coder review may  be revised to meet current compliance requirements. Maylon Peppers, MD Maylon Peppers,  04/16/2021 11:53:57 AM This report has been signed electronically. Number of Addenda: 0

## 2021-04-16 NOTE — Anesthesia Preprocedure Evaluation (Signed)
Anesthesia Evaluation  Patient identified by MRN, date of birth, ID band Patient awake    Reviewed: Allergy & Precautions, NPO status , Patient's Chart, lab work & pertinent test results  Airway Mallampati: II  TM Distance: <3 FB Neck ROM: Full   Comment: Cervical fusion Dental  (+) Dental Advisory Given, Missing   Pulmonary asthma , former smoker,    Pulmonary exam normal breath sounds clear to auscultation       Cardiovascular Exercise Tolerance: Good hypertension, Pt. on medications Normal cardiovascular exam Rhythm:Regular Rate:Normal     Neuro/Psych PSYCHIATRIC DISORDERS Anxiety Depression Bipolar Disorder  Neuromuscular disease CVA, Residual Symptoms    GI/Hepatic GERD  Medicated and Controlled,(+) Hepatitis - (NA[facility])  Endo/Other  negative endocrine ROS  Renal/GU Renal InsufficiencyRenal disease  Female GU complaint (cervical cancer)     Musculoskeletal  (+) Arthritis ,   Abdominal   Peds negative pediatric ROS (+)  Hematology negative hematology ROS (+)   Anesthesia Other Findings Cervical fusion  Reproductive/Obstetrics negative OB ROS                           Anesthesia Physical Anesthesia Plan  ASA: 3  Anesthesia Plan: General   Post-op Pain Management:    Induction: Intravenous  PONV Risk Score and Plan: TIVA  Airway Management Planned: Nasal Cannula and Natural Airway  Additional Equipment:   Intra-op Plan:   Post-operative Plan:   Informed Consent: I have reviewed the patients History and Physical, chart, labs and discussed the procedure including the risks, benefits and alternatives for the proposed anesthesia with the patient or authorized representative who has indicated his/her understanding and acceptance.     Dental advisory given  Plan Discussed with: CRNA and Surgeon  Anesthesia Plan Comments:        Anesthesia Quick Evaluation

## 2021-04-17 ENCOUNTER — Other Ambulatory Visit: Payer: Self-pay | Admitting: Internal Medicine

## 2021-04-17 DIAGNOSIS — J45909 Unspecified asthma, uncomplicated: Secondary | ICD-10-CM

## 2021-04-19 ENCOUNTER — Other Ambulatory Visit: Payer: Self-pay | Admitting: Internal Medicine

## 2021-04-19 ENCOUNTER — Encounter (INDEPENDENT_AMBULATORY_CARE_PROVIDER_SITE_OTHER): Payer: Self-pay

## 2021-04-19 DIAGNOSIS — J45909 Unspecified asthma, uncomplicated: Secondary | ICD-10-CM

## 2021-04-19 LAB — SURGICAL PATHOLOGY

## 2021-04-20 ENCOUNTER — Encounter (HOSPITAL_COMMUNITY): Payer: Self-pay | Admitting: Gastroenterology

## 2021-04-22 ENCOUNTER — Ambulatory Visit: Payer: PPO | Admitting: Nutrition

## 2021-04-22 ENCOUNTER — Ambulatory Visit: Payer: PPO | Admitting: Orthopedic Surgery

## 2021-04-23 ENCOUNTER — Ambulatory Visit (HOSPITAL_COMMUNITY): Payer: PPO

## 2021-04-26 ENCOUNTER — Telehealth (HOSPITAL_COMMUNITY): Payer: PPO | Admitting: Psychiatry

## 2021-04-26 ENCOUNTER — Telehealth (HOSPITAL_COMMUNITY): Payer: Self-pay | Admitting: Psychiatry

## 2021-04-26 ENCOUNTER — Other Ambulatory Visit: Payer: Self-pay | Admitting: Internal Medicine

## 2021-04-26 ENCOUNTER — Other Ambulatory Visit: Payer: Self-pay

## 2021-04-26 ENCOUNTER — Encounter (HOSPITAL_COMMUNITY): Payer: Self-pay | Admitting: Psychiatry

## 2021-04-26 DIAGNOSIS — R32 Unspecified urinary incontinence: Secondary | ICD-10-CM

## 2021-04-26 DIAGNOSIS — R413 Other amnesia: Secondary | ICD-10-CM

## 2021-04-26 DIAGNOSIS — F332 Major depressive disorder, recurrent severe without psychotic features: Secondary | ICD-10-CM | POA: Diagnosis not present

## 2021-04-26 MED ORDER — TRAZODONE HCL 100 MG PO TABS: 200.0000 mg | ORAL_TABLET | Freq: Every day | ORAL | 2 refills | Status: DC

## 2021-04-26 MED ORDER — VENLAFAXINE HCL ER 150 MG PO CP24: 150.0000 mg | ORAL_CAPSULE | Freq: Every day | ORAL | 2 refills | Status: DC

## 2021-04-26 MED ORDER — ALPRAZOLAM 1 MG PO TABS: 1.0000 mg | ORAL_TABLET | Freq: Three times a day (TID) | ORAL | 2 refills | Status: DC

## 2021-04-26 MED ORDER — METHYLPHENIDATE HCL 20 MG PO TABS: 20.0000 mg | ORAL_TABLET | Freq: Two times a day (BID) | ORAL | 0 refills | Status: DC

## 2021-04-26 NOTE — Progress Notes (Signed)
Virtual Visit via Telephone Note  I connected with [name] on 04/26/21 at  9:00 AM EDT by telephone and verified that I am speaking with the correct person using two identifiers.  Location: Patient: home Provider: home office   I discussed the limitations, risks, security and privacy concerns of performing an evaluation and management service by telephone and the availability of in person appointments. I also discussed with the patient that there may be a patient responsible charge related to this service. The patient expressed understanding and agreed to proceed.       I discussed the assessment and treatment plan with the patient. The patient was provided an opportunity to ask questions and all were answered. The patient agreed with the plan and demonstrated an understanding of the instructions.   The patient was advised to call back or seek an in-person evaluation if the symptoms worsen or if the condition fails to improve as anticipated.  I provided 15 minutes of non-face-to-face time during this encounter.   Diamond Spiller, MD  [facility] MD/PA/NP OP Progress Note  04/26/2021 9:22 AM [name]  MRN:  [id number]  Chief Complaint:  Chief Complaint   Depression; Anxiety; Follow-up    HPI: T his patient is a 60 year old married white female who lives with her husband in [place]. She has no children. 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   The patient was referred by her primary physician, Dr. [name], for further assessment and treatment of depression anxiety and focus problems.   The patient states that she did well most of her life until she had a stroke in 89 at age 54. She had a cerebral aneurysm that burst and she had to have a hematoma evacuated from the right frontal lobe. She has had resulting difficulties ever since and still has weakness on the left side of her body and poor fine motor skills in her left  hand. She is right handed. She states that she gets older her symptoms worsen.   The patient often feels anxious particular in crowds. She has frequent panic attacks. She takes Xanax 1 mg twice a day but is reluctant to take more. Shortly after she had her stroke she saw psychiatrist in [place] and was placed on the Xanax. Dr. [name] later put her on Effexor and she is now up to 150 mg per day. [name] not sure if it's helping. She has difficulty sleeping but trazodone helps to some degree. She also is significant problems with focus and attention span. Her thoughts ramble. She'll start one thing and go the next. She can't complete tasks. She finds this extremely frustrating. Her mood is labile at times and she'll get angry quickly and for no reason. She denies auditory or visual hallucinations or paranoia. She does not use drugs and very rarely takes a drink. She admits that time she has passive suicidal ideation but would never hurt her self because of her faith  The patient returns for follow-up after 3 months.  She has had several medical issues which have been bothering her.  She still is dealing with irritable bowel syndrome and she is now on a special diet to try to help.  She still has bouts of diarrhea and constipation.  She has pain in her hands which was thought to be coming from her neck.  She is trying to stay active around her house doing both cleaning and outdoor  yard work.  She denies significant depression or anxiety.  She is concerned because she is not remembering things as well and not focusing as well as she would like.  She thinks in retrospect she did better on the methylphenidate compared to Adderall.  She denies serious depression or suicidal ideation and the Xanax continues to help her anxiety Visit Diagnosis:    ICD-10-CM   1. Major depressive disorder, recurrent, severe without psychotic features ([place])  F33.2     2. Memory deficit  R41.3       Past Psychiatric History: Past  outpatient treatment for depression  Past Medical History:  Past Medical History:  Diagnosis Date   Allergy    grass, dust , mold   Anxiety    Arthritis    Asthma due to seasonal allergies 06/15/2020   Bipolar disorder ([place])    Cancer ([place])    cervical cancer   Carpal tunnel syndrome    Bilateral   Chest pain 09/2011   Cardiac cath-normal coronaries   Constipation    Depression    Difficulty urinating 05/31/2013   Elevated LFTs 12/16/2013   GERD (gastroesophageal reflux disease)    History of kidney stones    Hyperlipemia    Hyperlipidemia    Hypertension    Mild; provoked by stress and anxiety   IBS (irritable bowel syndrome)    Intracerebral bleed ([place])    No aneurysm; followed by Dr. Sherwood Gambler   Loss of weight 01/06/2015   Osteoporosis    Stage 3a chronic kidney disease ([place]) 03/16/2021   Stroke ([place]) 1999   hemorrhagic stroke; weakness of left side    Past Surgical History:  Procedure Laterality Date   ABDOMINAL HYSTERECTOMY     "cancer cells"   BIOPSY  04/16/2021   Procedure: BIOPSY;  Surgeon: Montez Morita, Quillian Quince, MD;  [facility];  Service: Gastroenterology;;  small, bowel, esophageal(proximal and distal);   [place]   to remove blood clot after stroke    CARDIAC CATHETERIZATION  2016   CERVICAL FUSION     CHOLECYSTECTOMY N/A 10/14/2014   Procedure: LAPAROSCOPIC CHOLECYSTECTOMY WITH INTRAOPERATIVE CHOLANGIOGRAM;  Surgeon: Jackolyn Confer, MD;  Location: [facility];  Service: General;  Laterality: N/A;   CHONDROPLASTY Right 07/13/2017   Procedure: CHONDROPLASTY of patella;  Surgeon: Carole Civil, MD;  Location: [facility];  Service: Orthopedics;  Laterality: Right;   ESOPHAGEAL DILATION N/A 04/16/2021   Procedure: ESOPHAGEAL DILATION;  Surgeon: Harvel Quale, MD;  [facility];  Service: Gastroenterology;  Laterality: N/A;   ESOPHAGOGASTRODUODENOSCOPY (EGD) WITH PROPOFOL N/A 04/16/2021   Procedure:  ESOPHAGOGASTRODUODENOSCOPY (EGD) WITH PROPOFOL;  Surgeon: Harvel Quale, MD;  [facility];  Service: Gastroenterology;  Laterality: N/A;  1:35, pt knows to arrive at 9:45   EUS N/A 08/21/2015   Procedure: ESOPHAGEAL ENDOSCOPIC ULTRASOUND (EUS) RADIAL;  Surgeon: Carol Ada, MD;  [facility];  Service: Endoscopy;  Laterality: N/A;   KNEE ARTHROSCOPY WITH MEDIAL MENISECTOMY Right 07/13/2017   Procedure: KNEE ARTHROSCOPY WITH PARTIAL MEDIAL MENISECTOMY;  Surgeon: Carole Civil, MD;  Location: [facility];  Service: Orthopedics;  Laterality: Right;   LEFT HEART CATHETERIZATION WITH CORONARY ANGIOGRAM N/A 09/23/2011   Procedure: LEFT HEART CATHETERIZATION WITH CORONARY ANGIOGRAM;  Surgeon: Thayer Headings, MD;  Location: [facility] C[facility];  Service: Cardiovascular;  Laterality: N/A;   LIPOMA EXCISION Left 11/18/2013   Procedure: EXCISION OF SOFT TISSUE MASS-LEFT THIGH;  Surgeon: Jamesetta So, MD;  Location: [facility];  Service: General;  Laterality: Left;   POLYPECTOMY  04/16/2021   Procedure: POLYPECTOMY;  Surgeon: Harvel Quale, MD;  [facility];  Service: Gastroenterology;;  gastric   RECTOCELE REPAIR     x2   RECTOCELE REPAIR N/A 04/04/2017   Procedure: POSTERIOR REPAIR (RECTOCELE);  Surgeon: Jonnie Kind, MD;  Location: [facility];  Service: Gynecology;  Laterality: N/A;    Family Psychiatric History: see below  Family History:  Family History  Problem Relation Age of Onset   Cancer Mother        breast    Hypertension Mother    Hyperlipidemia Mother    Depression Mother    Anxiety disorder Mother    COPD Mother    Arthritis Mother        rheumatoid   Drug abuse Sister    Coronary artery disease Paternal Grandfather    Coronary artery disease Paternal Uncle    Depression Cousin    Drug abuse Cousin     Social History:  Social History   Socioeconomic History   Marital status: Married    Spouse name: Sonia Side   Number of  children: 0   Years of education: HS   Highest education level: Not on file  Occupational History   Occupation: unemployed    Comment: pending disability  Tobacco Use   Smoking status: Former    Packs/day: 1.00    Years: 19.00    Pack years: 19.00    Types: Cigarettes    Quit date: 09/01/1997    Years since quitting: 23.6   Smokeless tobacco: Never   Tobacco comments:    Quit smoking 1999 , previous 20 pack years  Vaping Use   Vaping Use: Never used  Substance and Sexual Activity   Alcohol use: Yes    Comment: 1 drink every other week   Drug use: No   Sexual activity: Not Currently    Partners: Male    Birth control/protection: Surgical    Comment: hyst   Other Topics Concern   Not on file  Social History Narrative   Currently unable to work   Lives in [place]   Married   Patient drinks 1 cup of caffeine daily.   Patient is right handed.    Joined the [employer] to get more exercise   Social Determinants of Health   Financial Resource Strain: Low Risk    Difficulty of Paying Living Expenses: Not hard at all  Food Insecurity: No Food Insecurity   Worried About Charity fundraiser in the Last Year: Never true   Arboriculturist in the Last Year: Never true  Transportation Needs: No Transportation Needs   Lack of Transportation (Medical): No   Lack of Transportation (Non-Medical): No  Physical Activity: Inactive   Days of Exercise per Week: 0 days   Minutes of Exercise per Session: 0 min  Stress: No Stress Concern Present   Feeling of Stress : Not at all  Social Connections: Moderately Isolated   Frequency of Communication with Friends and Family: More than three times a week   Frequency of Social Gatherings with Friends and Family: More than three times a week   Attends Religious Services: Never   Marine scientist or Organizations: No   Attends Archivist Meetings: Never   Marital Status: Married    Allergies:  Allergies  Allergen Reactions    Morphine And Related Hives   Promethazine Hcl  Other (See Comments)    Causes patient to become Hyper    Metabolic Disorder Labs: Lab Results  Component Value Date   HGBA1C 5.3 06/02/2017   MPG 117 (H) 01/06/2015   MPG 117 (H) 02/17/2014   No results found for: PROLACTIN Lab Results  Component Value Date   CHOL 210 (H) 08/31/2020   TRIG 235 (H) 08/31/2020   HDL 65 08/31/2020   CHOLHDL 3.2 08/31/2020   VLDL 26 09/16/2015   LDLCALC 105 (H) 08/31/2020   LDLCALC 102 (H) 06/02/2017   Lab Results  Component Value Date   TSH 1.770 08/31/2020   TSH 1.438 09/20/2011    Therapeutic Level Labs: No results found for: LITHIUM No results found for: VALPROATE No components found for:  CBMZ  Current Medications: Current Outpatient Medications  Medication Sig Dispense Refill   methylphenidate (RITALIN) 20 MG tablet Take 1 tablet (20 mg total) by mouth 2 (two) times daily with breakfast and lunch. 60 tablet 0   methylphenidate (RITALIN) 20 MG tablet Take 1 tablet (20 mg total) by mouth 2 (two) times daily with breakfast and lunch. 60 tablet 0   acetaminophen (TYLENOL) 500 MG tablet Take 500-1,000 mg by mouth every 6 (six) hours as needed for moderate pain.     ALPRAZolam (XANAX) 1 MG tablet Take 1 tablet (1 mg total) by mouth 3 (three) times daily. 90 tablet 2   amLODipine (NORVASC) 5 MG tablet Take 1 tablet (5 mg total) by mouth daily. 90 tablet 1   atorvastatin (LIPITOR) 40 MG tablet Take 1 tablet (40 mg total) by mouth daily. (Patient taking differently: Take 40 mg by mouth in the morning and at bedtime.) 90 tablet 3   Black Cohosh 40 MG CAPS Take 40 mg by mouth daily.     Calcium 500-125 MG-UNIT TABS Take 1 tablet by mouth daily with supper.     Carboxymethylcellulose Sod PF 0.5 % SOLN Place 1 drop into both eyes daily as needed (dry eyes).     diclofenac (CATAFLAM) 50 MG tablet Take 1 tablet (50 mg total) by mouth 2 (two) times daily. 90 tablet 3   diclofenac Sodium (VOLTAREN) 1 %  GEL Apply 1 application topically daily as needed (pain).     dicyclomine (BENTYL) 10 MG capsule Take 1 capsule (10 mg total) by mouth every 12 (twelve) hours as needed (abdominal pain). 60 capsule 2   famotidine (PEPCID) 20 MG tablet Take 20 mg by mouth daily.     fexofenadine (ALLEGRA) 180 MG tablet Take 180 mg by mouth daily.     fluticasone (FLONASE) 50 MCG/ACT nasal spray Place 2 sprays into both nostrils daily.     lisinopril (ZESTRIL) 20 MG tablet Take 1 tablet (20 mg total) by mouth 2 (two) times daily. 180 tablet 1   lubiprostone (AMITIZA) 24 MCG capsule Take 1 capsule (24 mcg total) by mouth 2 (two) times daily with a meal. 180 capsule 1   methylphenidate (RITALIN) 20 MG tablet Take 1 tablet (20 mg total) by mouth 2 (two) times daily with breakfast and lunch. 60 tablet 0   montelukast (SINGULAIR) 10 MG tablet TAKE (1) TABLET BY MOUTH AT BEDTIME. 30 tablet 0   Multiple Vitamin (MULITIVITAMIN WITH MINERALS) TABS Take 1 tablet by mouth daily with breakfast.     nabumetone (RELAFEN) 500 MG tablet Take 500 mg by mouth every 8 (eight) hours as needed (joint pain).     nystatin (MYCOSTATIN/NYSTOP) powder Apply 1 application topically 3 (three) times  daily. (Patient taking differently: Apply 1 application topically 3 (three) times daily as needed.) 15 g 2   Olopatadine HCl 0.2 % SOLN Place 1 drop into both eyes in the morning and at bedtime.     omeprazole (PRILOSEC) 40 MG capsule Take 1 capsule (40 mg total) by mouth 2 (two) times daily for 14 days. 28 capsule 0   Probiotic Product (TRUBIOTICS PO) Take 1 capsule by mouth daily. Takes 25 cfu daily.     pyridOXINE (VITAMIN B-6) 100 MG tablet Take 100 mg by mouth daily.     traZODone (DESYREL) 100 MG tablet Take 2 tablets (200 mg total) by mouth at bedtime. 60 tablet 2   venlafaxine XR (EFFEXOR XR) 150 MG 24 hr capsule Take 1 capsule (150 mg total) by mouth daily with breakfast. 90 capsule 2   No current facility-administered medications for this  visit.     Musculoskeletal: Strength & Muscle Tone: na Gait & Station: na Patient leans: N/A  Psychiatric Specialty Exam: Review of Systems  Gastrointestinal:  Positive for abdominal pain, constipation and diarrhea.  Musculoskeletal:  Positive for arthralgias.  Psychiatric/Behavioral:  Positive for decreased concentration.   All other systems reviewed and are negative.  There were no vitals taken for this visit.There is no height or weight on file to calculate BMI.  General Appearance: NA  Eye Contact:  NA  Speech:  Clear and Coherent  Volume:  Normal  Mood:  Euthymic  Affect:  NA  Thought Process:  Goal Directed  Orientation:  Full (Time, Place, and Person)  Thought Content: Rumination   Suicidal Thoughts:  No  Homicidal Thoughts:  No  Memory:  Immediate;   Fair Recent;   Fair Remote;   Good  Judgement:  Good  Insight:  Fair  Psychomotor Activity:  Normal  Concentration:  Concentration: Poor and Attention Span: Poor  Recall:  [place] of Knowledge: Good  Language: Good  Akathisia:  No  Handed:  Right  AIMS (if indicated): not done  Assets:  Communication Skills Desire for Improvement Resilience Social Support Talents/Skills  ADL's:  Intact  Cognition: WNL  Sleep:  Good   Screenings: AUDIT    Flowsheet Row Clinical Support from 01/06/2021 in [facility]  Alcohol Use Disorder Identification Test Final Score (AUDIT) 4      MDI    Flowsheet Row Office Visit from 01/22/2016 in [facility]  Total Score (max 50) 34      Mini-Mental    Flowsheet Row Office Visit from 02/13/2015 in [facility]  Total Score (max 30 points ) 26      PHQ2-9    Flowsheet Row Video Visit from 04/26/2021 in [facility] Office Visit from 03/16/2021 in [facility] Office Visit from 03/09/2021 in [facility] Office Visit from 02/16/2021 in  [facility] Video Visit from 01/29/2021 in [facility]  PHQ-2 Total Score 0 0 0 0 0  PHQ-9 Total Score -- 0 6 4 --      SBQ-R    [place] Office Visit from 01/22/2016 in [facility]  SBQ-R Total Score 14.1      Flowsheet Row Admission (Discharged) from 04/16/2021 in [facility] from 04/14/2021 in [facility] Video Visit from 01/29/2021 in [facility] No Risk No Risk No Risk  Assessment and Plan: This patient is a 60 year old female with a history of depression anxiety and problems with focus and energy.  She does not think the Adderall is working that well so we will go back to methylphenidate 20 mg twice daily for focus and alertness but continue trazodone 200 mg at bedtime for sleep, Effexor XR 150 mg daily for depression and Xanax 1 mg up to 3 times daily for anxiety.  She will return to see me in 3 months   Diamond Spiller, MD 04/26/2021, 9:22 AM

## 2021-04-26 NOTE — Telephone Encounter (Signed)
Called to schedule f/u appt left detailed vm

## 2021-04-28 ENCOUNTER — Encounter: Payer: PPO | Admitting: Internal Medicine

## 2021-04-29 ENCOUNTER — Encounter: Payer: Self-pay | Admitting: Internal Medicine

## 2021-05-03 ENCOUNTER — Telehealth: Payer: PPO | Admitting: Internal Medicine

## 2021-05-03 ENCOUNTER — Encounter: Payer: PPO | Attending: Internal Medicine | Admitting: Nutrition

## 2021-05-03 ENCOUNTER — Other Ambulatory Visit: Payer: Self-pay

## 2021-05-03 ENCOUNTER — Encounter: Payer: Self-pay | Admitting: Nutrition

## 2021-05-03 VITALS — Ht 63.0 in | Wt 138.8 lb

## 2021-05-03 DIAGNOSIS — K7581 Nonalcoholic steatohepatitis (NASH): Secondary | ICD-10-CM | POA: Insufficient documentation

## 2021-05-03 DIAGNOSIS — K589 Irritable bowel syndrome without diarrhea: Secondary | ICD-10-CM | POA: Insufficient documentation

## 2021-05-03 DIAGNOSIS — K219 Gastro-esophageal reflux disease without esophagitis: Secondary | ICD-10-CM | POA: Insufficient documentation

## 2021-05-03 DIAGNOSIS — I10 Essential (primary) hypertension: Secondary | ICD-10-CM | POA: Insufficient documentation

## 2021-05-03 NOTE — Patient Instructions (Signed)
Goals  Follow the FODMAP Elimited foods not allowed from diet Keep food journal Cut out onions, mushrooms and honey Drink 5 bottles of water per day. Exercise 15-30 minutes

## 2021-05-03 NOTE — Progress Notes (Signed)
Medical Nutrition Therapy  Appointment Start time:  6195  Appointment End time:  52  Primary concerns today: HTN  Referral diagnosis: I10.1 Preferred learning style:  no preferences   Learning readiness: ready    NUTRITION ASSESSMENT  60 yr old female with  NASH, GERD, HTN and  IBS and Hyperlipidemia. Took herself off of Amolodipine  BP has been 127/83 mg/dl. Reduced Lisinopril to once a day.  Anthropometrics  Wt Readings from Last 3 Encounters:  04/14/21 136 lb (61.7 kg)  04/12/21 140 lb (63.5 kg)  04/01/21 139 lb 6.4 oz (63.2 kg)   Ht Readings from Last 3 Encounters:  04/14/21 5' 3"  (1.6 m)  04/12/21 5' 3"  (1.6 m)  04/01/21 5' 3"  (1.6 m)   There is no height or weight on file to calculate BMI. @BMIFA @ Facility age limit for growth percentiles is 20 years. Facility age limit for growth percentiles is 20 years.   Clinical Medical Hx: IBS, HTN,  Medications: see chart Labs:  CMP Latest Ref Rng & Units 04/14/2021 01/29/2021 08/31/2020  Glucose 70 - 99 mg/dL 112(H) 101(H) 96  BUN 6 - 20 mg/dL 24(H) 18 18  Creatinine 0.44 - 1.00 mg/dL 1.16(H) 1.17(H) 1.07(H)  Sodium 135 - 145 mmol/L 139 143 140  Potassium 3.5 - 5.1 mmol/L 4.1 5.0 4.4  Chloride 98 - 111 mmol/L 101 103 100  CO2 22 - 32 mmol/L 30 25 25   Calcium 8.9 - 10.3 mg/dL 9.5 9.7 9.4  Total Protein 6.0 - 8.5 g/dL - 7.0 6.9  Total Bilirubin 0.0 - 1.2 mg/dL - 0.6 0.5  Alkaline Phos 44 - 121 IU/L - 69 77  AST 0 - 40 IU/L - 27 38  ALT 0 - 32 IU/L - 48(H) 60(H)   Lipid Panel     Component Value Date/Time   CHOL 210 (H) 08/31/2020 0839   TRIG 235 (H) 08/31/2020 0839   HDL 65 08/31/2020 0839   CHOLHDL 3.2 08/31/2020 0839   CHOLHDL 2.3 09/16/2015 0939   VLDL 26 09/16/2015 0939   LDLCALC 105 (H) 08/31/2020 0839   LABVLDL 40 08/31/2020 0839    Notable Signs/Symptoms: GI Issues,   Lifestyle & Dietary Hx Lives with her husband.  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 Don't eat out much. Has had a lot of GI issues. Has  IBS-C/D. She has been following the DASH diet.   Estimated daily fluid intake: 45 oz Supplements: VIt D, Calcium, Black Cohosh Sleep: 8-9 hours Stress / self-care: none Current average weekly physical activity: Works on her flower bed.  24-Hr Dietary Recall First Meal: 1 cup coffee, [place] granola bar, Snack: water Second Meal: Egg salad sandwich on white bread, water Snack:  Third Meal: Spaghetti 1 cup, water Snack: sun chips, flavored water Beverages: water, flavored water and coffee  Estimated Energy Needs Calories: 1500 Carbohydrate: 170 g Protein: 112g Fat: 42g   NUTRITION DIAGNOSIS  NI-5.11.2 Predicted excessive nutrient intake As related to IBS.  As evidenced by chronic constipation, and diarrhea and GI issues of pain, cramping and nausea.   NUTRITION INTERVENTION  Nutrition education (E-1) on the following topics:  Low Salt Diet Side effects of Black Cohosh  Handouts Provided Include  My Plate Low Salt diet FODMAP handout  Learning Style & Readiness for Change Teaching method utilized: Visual & Auditory  Demonstrated degree of understanding via: Teach Back  Barriers to learning/adherence to lifestyle change: none  Goals Established by Pt Goals  Follow the FODMAP Elimited foods not  allowed from diet Keep food journal Cut out onions, mushrooms and honey Drink 5 bottles of water per day. Exercise 15-30 minutes   MONITORING & EVALUATION Dietary intake, weekly physical activity, and GI issues and BP in 1 month.  Next Steps  Patient is to work on following a low salt diet.

## 2021-05-04 ENCOUNTER — Encounter: Payer: Self-pay | Admitting: Internal Medicine

## 2021-05-04 ENCOUNTER — Encounter: Payer: Self-pay | Admitting: Allergy & Immunology

## 2021-05-04 ENCOUNTER — Ambulatory Visit (HOSPITAL_COMMUNITY)
Admission: RE | Admit: 2021-05-04 | Discharge: 2021-05-04 | Disposition: A | Discharge status: Home / Self Care | Payer: PPO | Source: Ambulatory Visit | Attending: Adult Health | Admitting: Adult Health

## 2021-05-04 ENCOUNTER — Ambulatory Visit (INDEPENDENT_AMBULATORY_CARE_PROVIDER_SITE_OTHER): Payer: PPO | Admitting: Internal Medicine

## 2021-05-04 VITALS — BP 133/79

## 2021-05-04 DIAGNOSIS — J3489 Other specified disorders of nose and nasal sinuses: Secondary | ICD-10-CM

## 2021-05-04 DIAGNOSIS — Z90710 Acquired absence of both cervix and uterus: Secondary | ICD-10-CM | POA: Diagnosis not present

## 2021-05-04 DIAGNOSIS — R102 Pelvic and perineal pain: Secondary | ICD-10-CM | POA: Insufficient documentation

## 2021-05-04 DIAGNOSIS — N941 Unspecified dyspareunia: Secondary | ICD-10-CM | POA: Diagnosis not present

## 2021-05-04 DIAGNOSIS — I10 Essential (primary) hypertension: Secondary | ICD-10-CM | POA: Diagnosis not present

## 2021-05-04 DIAGNOSIS — Z8541 Personal history of malignant neoplasm of cervix uteri: Secondary | ICD-10-CM | POA: Diagnosis not present

## 2021-05-04 NOTE — Progress Notes (Signed)
Virtual Visit via Telephone Note   This visit type was conducted due to national recommendations for restrictions regarding the COVID-19 Pandemic (e.g. social distancing) in an effort to limit this patient's exposure and mitigate transmission in our community.  Due to Diamond "[name]"'s co-morbid illnesses, this patient is at least at moderate risk for complications without adequate follow up.  This format is felt to be most appropriate for this patient at this time.  The patient did not have access to video technology/had technical difficulties with video requiring transitioning to audio format only (telephone).  All issues noted in this document were discussed and addressed.  No physical exam could be performed with this format.  Evaluation Performed:  Follow-up visit  Date:  05/04/2021   ID:  [name], [name] [birth year], MRN [id number]  Patient Location: Home Provider Location: Office/Clinic  Participants: Patient Location of Patient: Home Location of Provider: Telehealth Consent was obtain for visit to be over via telehealth. I verified that I am speaking with the correct person using two identifiers.  PCP:  Lindell Spar, MD   Chief Complaint: HTN follow up  History of Present Illness:    [name] is a 60 y.o. female who has a televisit for follow up of HTN and concern for her antihypertensives. She was on Amlodipine and Lisinopril. She has been trying to wean herself off of her antihypertensives and follow low salt diet. She has been following FODMAP diet for IBS and has been losing weight. Her BP was 133/79 today, and stays around 120s-70s on most of the days. She denies any chest pain or palpitations.  The patient does not have symptoms concerning for COVID-19 infection (fever, chills, cough, or new shortness of breath).   Past Medical, Surgical, Social History, Allergies, and Medications have been Reviewed.  Past Medical History:  Diagnosis Date    Allergy    grass, dust , mold   Anxiety    Arthritis    Asthma due to seasonal allergies 06/15/2020   Bipolar disorder ([place])    Cancer ([place])    cervical cancer   Carpal tunnel syndrome    Bilateral   Chest pain 09/2011   Cardiac cath-normal coronaries   Constipation    Depression    Difficulty urinating 05/31/2013   Elevated LFTs 12/16/2013   GERD (gastroesophageal reflux disease)    History of kidney stones    Hyperlipemia    Hyperlipidemia    Hypertension    Mild; provoked by stress and anxiety   IBS (irritable bowel syndrome)    Intracerebral bleed ([place])    No aneurysm; followed by Dr. Sherwood Gambler   Loss of weight 01/06/2015   Osteoporosis    Stage 3a chronic kidney disease ([place]) 03/16/2021   Stroke ([place]) 1999   hemorrhagic stroke; weakness of left side   Past Surgical History:  Procedure Laterality Date   ABDOMINAL HYSTERECTOMY     "cancer cells"   BIOPSY  04/16/2021   Procedure: BIOPSY;  Surgeon: Montez Morita, Quillian Quince, MD;  [facility]: [facility];  Service: Gastroenterology;;  small, bowel, esophageal(proximal and distal);   [place]   to remove blood clot after stroke    CARDIAC CATHETERIZATION  2016   CERVICAL FUSION     CHOLECYSTECTOMY N/A 10/14/2014   Procedure: LAPAROSCOPIC CHOLECYSTECTOMY WITH INTRAOPERATIVE CHOLANGIOGRAM;  Surgeon: Jackolyn Confer, MD;  Location: [facility];  Service: General;  Laterality: N/A;   CHONDROPLASTY Right 07/13/2017   Procedure:  CHONDROPLASTY of patella;  Surgeon: Carole Civil, MD;  Location: [facility];  Service: Orthopedics;  Laterality: Right;   ESOPHAGEAL DILATION N/A 04/16/2021   Procedure: ESOPHAGEAL DILATION;  Surgeon: Harvel Quale, MD;  [facility];  Service: Gastroenterology;  Laterality: N/A;   ESOPHAGOGASTRODUODENOSCOPY (EGD) WITH PROPOFOL N/A 04/16/2021   Procedure: ESOPHAGOGASTRODUODENOSCOPY (EGD) WITH PROPOFOL;  Surgeon: Harvel Quale, MD;  [facility];  Service:  Gastroenterology;  Laterality: N/A;  1:35, pt knows to arrive at 9:45   EUS N/A 08/21/2015   Procedure: ESOPHAGEAL ENDOSCOPIC ULTRASOUND (EUS) RADIAL;  Surgeon: Carol Ada, MD;  Location: [facility];  Service: Endoscopy;  Laterality: N/A;   KNEE ARTHROSCOPY WITH MEDIAL MENISECTOMY Right 07/13/2017   Procedure: KNEE ARTHROSCOPY WITH PARTIAL MEDIAL MENISECTOMY;  Surgeon: Carole Civil, MD;  Location: [facility];  Service: Orthopedics;  Laterality: Right;   LEFT HEART CATHETERIZATION WITH CORONARY ANGIOGRAM N/A 09/23/2011   Procedure: LEFT HEART CATHETERIZATION WITH CORONARY ANGIOGRAM;  Surgeon: Thayer Headings, MD;  [facility];  Service: Cardiovascular;  Laterality: N/A;   LIPOMA EXCISION Left 11/18/2013   Procedure: EXCISION OF SOFT TISSUE MASS-LEFT THIGH;  Surgeon: Jamesetta So, MD;  Location: [facility];  Service: General;  Laterality: Left;   POLYPECTOMY  04/16/2021   Procedure: POLYPECTOMY;  Surgeon: Harvel Quale, MD;  [facility];  Service: Gastroenterology;;  gastric   RECTOCELE REPAIR     x2   RECTOCELE REPAIR N/A 04/04/2017   Procedure: POSTERIOR REPAIR (RECTOCELE);  Surgeon: Jonnie Kind, MD;  Location: [facility];  Service: Gynecology;  Laterality: N/A;     Current Meds  Medication Sig   acetaminophen (TYLENOL) 500 MG tablet Take 500-1,000 mg by mouth every 6 (six) hours as needed for moderate pain.   ALPRAZolam (XANAX) 1 MG tablet Take 1 tablet (1 mg total) by mouth 3 (three) times daily.   atorvastatin (LIPITOR) 40 MG tablet Take 1 tablet (40 mg total) by mouth daily. (Patient taking differently: Take 40 mg by mouth in the morning and at bedtime.)   Black Cohosh 40 MG CAPS Take 40 mg by mouth daily.   Calcium 500-125 MG-UNIT TABS Take 1 tablet by mouth daily with supper.   Carboxymethylcellulose Sod PF 0.5 % SOLN Place 1 drop into both eyes daily as needed (dry eyes).   diclofenac (CATAFLAM) 50 MG tablet Take 1 tablet (50 mg total) by mouth 2  (two) times daily.   diclofenac Sodium (VOLTAREN) 1 % GEL Apply 1 application topically daily as needed (pain).   dicyclomine (BENTYL) 10 MG capsule Take 1 capsule (10 mg total) by mouth every 12 (twelve) hours as needed (abdominal pain).   famotidine (PEPCID) 20 MG tablet Take 20 mg by mouth daily.   fexofenadine (ALLEGRA) 180 MG tablet Take 180 mg by mouth daily.   fluticasone (FLONASE) 50 MCG/ACT nasal spray Place 2 sprays into both nostrils daily.   lubiprostone (AMITIZA) 24 MCG capsule Take 1 capsule (24 mcg total) by mouth 2 (two) times daily with a meal.   montelukast (SINGULAIR) 10 MG tablet TAKE (1) TABLET BY MOUTH AT BEDTIME.   Multiple Vitamin (MULITIVITAMIN WITH MINERALS) TABS Take 1 tablet by mouth daily with breakfast.   nabumetone (RELAFEN) 500 MG tablet Take 500 mg by mouth every 8 (eight) hours as needed (joint pain).   nystatin (MYCOSTATIN/NYSTOP) powder Apply 1 application topically 3 (three) times daily. (Patient taking differently: Apply 1 application topically 3 (three) times daily  as needed.)   Olopatadine HCl 0.2 % SOLN Place 1 drop into both eyes in the morning and at bedtime.   oxybutynin (DITROPAN-XL) 10 MG 24 hr tablet TAKE (1) TABLET BY MOUTH AT BEDTIME   Probiotic Product (TRUBIOTICS PO) Take 1 capsule by mouth daily. Takes 25 cfu daily.   pyridOXINE (VITAMIN B-6) 100 MG tablet Take 100 mg by mouth daily.   traZODone (DESYREL) 100 MG tablet Take 2 tablets (200 mg total) by mouth at bedtime.   venlafaxine XR (EFFEXOR XR) 150 MG 24 hr capsule Take 1 capsule (150 mg total) by mouth daily with breakfast.     Allergies:   Morphine and related and Promethazine hcl   ROS:   Please see the history of present illness.     All other systems reviewed and are negative.   Labs/Other Tests and Data Reviewed:    Recent Labs: 08/31/2020: TSH 1.770 01/29/2021: ALT 48 04/14/2021: BUN 24; Creatinine, Ser 1.16; Hemoglobin 13.2; Platelets 313; Potassium 4.1; Sodium 139    Recent Lipid Panel Lab Results  Component Value Date/Time   CHOL 210 (H) 08/31/2020 08:39 AM   TRIG 235 (H) 08/31/2020 08:39 AM   HDL 65 08/31/2020 08:39 AM   CHOLHDL 3.2 08/31/2020 08:39 AM   CHOLHDL 2.3 09/16/2015 09:39 AM   LDLCALC 105 (H) 08/31/2020 08:39 AM    Wt Readings from Last 3 Encounters:  05/03/21 138 lb 12.8 oz (63 kg)  04/14/21 136 lb (61.7 kg)  04/12/21 140 lb (63.5 kg)      ASSESSMENT & PLAN:    Hypertension BP Readings from Last 1 Encounters:  05/04/21 133/79   Well-controlled currently without Lisinopril DC Amlodipine as she has not been taking it Advised DASH diet and moderate exercise/walking, at least 150 mins/week Advised to bring home BP cuff in the next visit and will compare with office reading   Time:   Today, I have spent 12 minutes reviewing the chart, including problem list, medications, and with the patient with telehealth technology discussing the above problems.   Medication Adjustments/Labs and Tests Ordered: Current medicines are reviewed at length with the patient today.  Concerns regarding medicines are outlined above.   Tests Ordered: No orders of the defined types were placed in this encounter.   Medication Changes: No orders of the defined types were placed in this encounter.    Note: This dictation was prepared with Dragon dictation along with smaller phrase technology. Similar sounding words can be transcribed inadequately or may not be corrected upon review. Any transcriptional errors that result from this process are unintentional.      Disposition:  Follow up  Signed, Lindell Spar, MD  05/04/2021 3:24 PM     [facility]

## 2021-05-04 NOTE — Assessment & Plan Note (Signed)
BP Readings from Last 1 Encounters:  05/04/21 133/79   Well-controlled currently without Lisinopril DC Amlodipine as she has not been taking it Advised DASH diet and moderate exercise/walking, at least 150 mins/week Advised to bring home BP cuff in the next visit and will compare with office reading

## 2021-05-04 NOTE — Patient Instructions (Signed)
Please continue to follow FODMAP diet and perform moderate exercise/walking at least 150 mins/week.

## 2021-05-06 ENCOUNTER — Telehealth (INDEPENDENT_AMBULATORY_CARE_PROVIDER_SITE_OTHER): Payer: Self-pay

## 2021-05-06 NOTE — Telephone Encounter (Signed)
Mitzie, per Dr.Castaneda please schedule this patient an appointment in a next available slot. Thanks

## 2021-05-10 ENCOUNTER — Other Ambulatory Visit (INDEPENDENT_AMBULATORY_CARE_PROVIDER_SITE_OTHER): Payer: Self-pay | Admitting: Gastroenterology

## 2021-05-11 ENCOUNTER — Ambulatory Visit (INDEPENDENT_AMBULATORY_CARE_PROVIDER_SITE_OTHER): Payer: PPO | Admitting: Pharmacist

## 2021-05-11 DIAGNOSIS — F32A Depression, unspecified: Secondary | ICD-10-CM

## 2021-05-11 DIAGNOSIS — J45909 Unspecified asthma, uncomplicated: Secondary | ICD-10-CM

## 2021-05-11 DIAGNOSIS — E782 Mixed hyperlipidemia: Secondary | ICD-10-CM

## 2021-05-11 DIAGNOSIS — I10 Essential (primary) hypertension: Secondary | ICD-10-CM

## 2021-05-11 DIAGNOSIS — F419 Anxiety disorder, unspecified: Secondary | ICD-10-CM

## 2021-05-11 DIAGNOSIS — N1831 Chronic kidney disease, stage 3a: Secondary | ICD-10-CM

## 2021-05-11 NOTE — Patient Instructions (Signed)
[name],  It was great to talk to you today!  Please call me with any questions or concerns.   Visit Information  Patient Goals/Self-Care Activities Over the next 90 days, patient will:  Take medications as prescribed Check blood pressure at least once daily, document, and provide at future appointments Target a minimum of 150 minutes of moderate intensity exercise weekly Come get labs drawn (fasting) in the next few days  Patient verbalizes understanding of instructions provided today and agrees to view in [place].   Telephone follow up appointment with care management team member scheduled for:05/19/21  Kennon Holter, PharmD Clinical Pharmacist [facility] [phone]

## 2021-05-11 NOTE — Chronic Care Management (AMB) (Signed)
Chronic Care Management Pharmacy Note  05/11/2021 Name:  [name] MRN:  [id number] DOB:  05-13-1961  Summary:  Hyperlipidemia: Repeat lipid panel ordered today. Patient will come in and complete (fasting) over next week. Will discuss results with patient in 1 week. Consider adding ezetimibe 10 mg by mouth daily if LDL remains elevated above goal.   Chronic Kidney Disease Stage 3a - GFR 45-59 (Mild to Moderately Reduced Function): Most recent microalbumin: unknown, will order to determine if patient would be a good candidate for SGLT2i or MRA therapy   Subjective: Jaydah L Brandt is an 60 y.o. year old female who is a primary patient of Lindell Spar, MD.  The CCM team was consulted for assistance with disease management and care coordination needs.    Engaged with patient by telephone for follow up visit in response to provider referral for pharmacy case management and/or care coordination services.   Consent to Services:  The patient was given information about Chronic Care Management services, agreed to services, and gave verbal consent prior to initiation of services.  Please see initial visit note for detailed documentation.   Patient Care Team: Lindell Spar, MD as PCP - General (Internal Medicine) Beryle Lathe, [facility] (Pharmacist)  Objective:  Lab Results  Component Value Date   CREATININE 1.16 (H) 04/14/2021   CREATININE 1.17 (H) 01/29/2021   CREATININE 1.07 (H) 08/31/2020    Lab Results  Component Value Date   HGBA1C 5.3 06/02/2017   Last diabetic Eye exam: No results found for: HMDIABEYEEXA  Last diabetic Foot exam: No results found for: HMDIABFOOTEX      Component Value Date/Time   CHOL 210 (H) 08/31/2020 0839   TRIG 235 (H) 08/31/2020 0839   HDL 65 08/31/2020 0839   CHOLHDL 3.2 08/31/2020 0839   CHOLHDL 2.3 09/16/2015 0939   VLDL 26 09/16/2015 0939   LDLCALC 105 (H) 08/31/2020 0839    Hepatic Function Latest Ref Rng & Units  01/29/2021 08/31/2020 06/02/2017  Total Protein 6.0 - 8.5 g/dL 7.0 6.9 7.4  Albumin 3.8 - 4.9 g/dL 4.6 4.4 5.1  AST 0 - 40 IU/L 27 38 19  ALT 0 - 32 IU/L 48(H) 60(H) 17  Alk Phosphatase 44 - 121 IU/L 69 77 80  Total Bilirubin 0.0 - 1.2 mg/dL 0.6 0.5 0.9    Lab Results  Component Value Date/Time   TSH 1.770 08/31/2020 08:39 AM   TSH 1.438 09/20/2011 08:20 PM   FREET4 1.11 08/31/2020 08:39 AM    CBC Latest Ref Rng & Units 04/14/2021 08/31/2020 07/10/2017  WBC 4.0 - 10.5 K/uL 10.9(H) 5.7 7.5  Hemoglobin 12.0 - 15.0 g/dL 13.2 13.5 13.3  Hematocrit 36.0 - 46.0 % 38.4 39.9 39.9  Platelets 150 - 400 K/uL 313 289 304    Lab Results  Component Value Date/Time   VD25OH 41.6 08/31/2020 08:39 AM   VD25OH 36.8 06/02/2017 09:22 AM    Clinical ASCVD: Yes  The 10-year ASCVD risk score (Arnett DK, et al., 2019) is: 3.6%   Values used to calculate the score:     Age: 60 years     Sex: Female     Is Non-Hispanic African American: No     Diabetic: No     Tobacco smoker: No     Systolic Blood Pressure: 595 mmHg     Is BP treated: Yes     HDL Cholesterol: 65 mg/dL     Total Cholesterol: 210 mg/dL  Social History   Tobacco Use  Smoking Status Former   Packs/day: 1.00   Years: 19.00   Pack years: 19.00   Types: Cigarettes   Quit date: 09/01/1997   Years since quitting: 23.7  Smokeless Tobacco Never  Tobacco Comments   Quit smoking 1999 , previous 20 pack years   BP Readings from Last 3 Encounters:  05/04/21 133/79  04/16/21 107/68  04/14/21 124/77   Pulse Readings from Last 3 Encounters:  04/16/21 71  04/14/21 77  04/12/21 75   Wt Readings from Last 3 Encounters:  05/03/21 138 lb 12.8 oz (63 kg)  04/14/21 136 lb (61.7 kg)  04/12/21 140 lb (63.5 kg)    Assessment: Review of patient past medical history, allergies, medications, health status, including review of consultants reports, laboratory and other test data, was performed as part of comprehensive evaluation and  provision of chronic care management services.   SDOH:  (Social Determinants of Health) assessments and interventions performed:    CCM Care Plan  Allergies  Allergen Reactions   Morphine And Related Hives   Promethazine Hcl Other (See Comments)    Causes patient to become Hyper    Medications Reviewed Today     Reviewed by Beryle Lathe, [facility] (Pharmacist) on 05/11/21 at 1136  Med List Status: <None>   Medication Order Taking? Sig Documenting Provider Last Dose Status Informant  acetaminophen (TYLENOL) 500 MG tablet [id number] Yes Take 500-1,000 mg by mouth every 6 (six) hours as needed for moderate pain. [provider] Taking Active Self  ALPRAZolam Duanne Moron) 1 MG tablet [id number] Yes Take 1 tablet (1 mg total) by mouth 3 (three) times daily. Cloria Spring, MD Taking Active   atorvastatin (LIPITOR) 40 MG tablet [id number] Yes Take 1 tablet (40 mg total) by mouth daily. Lindell Spar, MD Taking Active            Med Note Beryle Lathe   Tue May 11, 2021 11:11 AM)    Calcium 500-125 MG-UNIT TABS [id number] Yes Take 1 tablet by mouth daily with supper. [provider] Taking Active Self  Carboxymethylcellulose Sod PF 0.5 % SOLN [id number] Yes Place 1 drop into both eyes daily as needed (dry eyes). [provider] Taking Active Self  diclofenac (CATAFLAM) 50 MG tablet [id number] Yes Take 1 tablet (50 mg total) by mouth 2 (two) times daily. Carole Civil, MD Taking Active Self  diclofenac Sodium (VOLTAREN) 1 % GEL [id number] Yes Apply 1 application topically daily as needed (pain). [provider] Taking Active Self  dicyclomine (BENTYL) 10 MG capsule [id number] Yes Take 1 capsule (10 mg total) by mouth every 12 (twelve) hours as needed (abdominal pain). Montez Morita, Quillian Quince, MD Taking Active Self  famotidine (PEPCID) 20 MG tablet [id number] Yes Take 20 mg by mouth daily as needed. [provider] Taking Active Self            Med Note Beryle Lathe   Tue May 11, 2021 11:12 AM)    fexofenadine (ALLEGRA) 180 MG tablet [id number] Yes Take 180 mg by mouth daily. [provider] Taking Active Self  fluticasone (FLONASE) 50 MCG/ACT nasal spray [id number] Yes Place 2 sprays into both nostrils daily. [provider] Taking Active Self  lisinopril (ZESTRIL) 20 MG tablet [id number] Yes Take 1 tablet (20 mg total) by mouth 2 (two) times daily.  Patient taking differently: Take 20 mg by mouth daily.   Lindell Spar, MD Taking Active  Med Note Beryle Lathe   Tue May 11, 2021 11:12 AM)    lubiprostone (AMITIZA) 24 MCG capsule [id number] Yes Take 1 capsule (24 mcg total) by mouth 2 (two) times daily with a meal. Montez Morita, Quillian Quince, MD Taking Active Self           Med Note Darylene Price May 03, 2021  2:42 PM)  EOD  methylphenidate (RITALIN) 20 MG tablet [id number] Yes Take 1 tablet (20 mg total) by mouth 2 (two) times daily with breakfast and lunch. Cloria Spring, MD Taking Active   montelukast (SINGULAIR) 10 MG tablet [id number] Yes TAKE (1) TABLET BY MOUTH AT BEDTIME. Lindell Spar, MD Taking Active   Multiple Vitamin [facility] WITH MINERALS) TABS [id number] Yes Take 1 tablet by mouth daily with breakfast. [provider] Taking Active Self  nabumetone (RELAFEN) 500 MG tablet [id number] Yes Take 500 mg by mouth every 8 (eight) hours as needed (joint pain). [provider] Taking Active Self  nystatin (MYCOSTATIN/NYSTOP) powder [id number] Yes Apply 1 application topically 3 (three) times daily.  Patient taking differently: Apply 1 application topically 3 (three) times daily as needed.   Lindell Spar, MD Taking Active   Olopatadine HCl 0.2 % SOLN [id number] Yes Place 1 drop into both eyes in the morning and at bedtime. [provider] Taking Active Self  omeprazole (PRILOSEC) 40 MG capsule [id number] Yes TAKE ONE CAPSULE BY MOUTH TWICE  DAILY. Montez Morita, Quillian Quince, MD Taking Active   oxybutynin (DITROPAN-XL) 10 MG 24 hr tablet [id number] Yes TAKE (1) TABLET BY MOUTH AT BEDTIME Lindell Spar, MD Taking Active   Probiotic Product (TRUBIOTICS PO) [id number] Yes Take 1 capsule by mouth daily. Takes 25 cfu daily. [provider] Taking Active Self  pyridOXINE (VITAMIN B-6) 100 MG tablet [id number] Yes Take 100 mg by mouth daily. [provider] Taking Active Self  traZODone (DESYREL) 100 MG tablet [id number] Yes Take 2 tablets (200 mg total) by mouth at bedtime. Cloria Spring, MD Taking Active   venlafaxine XR (EFFEXOR XR) 150 MG 24 hr capsule [id number] Yes Take 1 capsule (150 mg total) by mouth daily with breakfast. 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   History of hemorrhagic stroke with residual hemiplegia ([place]) 06/15/2020   Urinary incontinence 06/15/2020   Asthma due to seasonal allergies 06/15/2020   Vaginal itching 03/16/2020   S/P right knee arthroscopy 07/13/17 07/20/2017   Derangement of posterior horn of medial meniscus of right knee    Chondromalacia, patella, right    IBS (irritable bowel syndrome) 05/11/2015   Gait instability 01/06/2015   Dyspareunia, female 12/11/2014   DDD (degenerative disc disease), lumbar 02/18/2014   Elevated levels of transaminase & lactic acid dehydrogenase 12/16/2013   Cerebral infarction ([place]) 11/01/2013   Postmenopausal atrophic vaginitis 11/01/2013   Lipoma of abdominal wall 08/14/2013   Back pain 05/29/2013   Paresthesia of foot 02/12/2013    Hypertension    GERD (gastroesophageal reflux disease)    Depression  11/17/2011   Insomnia 11/17/2011   Mixed hyperlipidemia 09/20/2011   Anxiety 09/20/2011    Immunization History  Administered Date(s) Administered   Influenza Split 08/13/2012, 05/11/2013   Influenza,inj,Quad PF,6+ Mos 05/11/2015, 03/24/2019   Influenza-Unspecified 05/10/2014, 04/17/2017, 03/16/2020   PFIZER(Purple Top)SARS-COV-2 Vaccination 09/26/2019, 10/19/2019, 08/12/2020   Tdap 04/30/2008, 02/12/2013    Conditions to be addressed/monitored: HTN, HLD, CKD Stage 3a, Anxiety, Depression, and Asthma  Care Plan : Medication Management  Updates made by Beryle Lathe, [place] since 05/11/2021 12:00 AM     Problem: HTN, HLD, CKD, Asthma/Allergies, Anxiety/Depression   Priority: High  Onset Date: 03/11/2021     Long-Range Goal: Disease Progression Prevention   Start Date: 03/11/2021  Expected End Date: 06/09/2021  Recent Progress: On track  Priority: High  Note:   Current Barriers:  Unable to achieve control of hyperlipidemia Suboptimal therapeutic regimen for chronic kidney disease  Pharmacist Clinical Goal(s):  Over the next 90 days, patient will Achieve control of hyperlipidemia as evidenced by improved LDL and improved triglycerides Adhere to plan to optimize therapeutic regimen for chronic kidney disease as evidenced by report of adherence to recommended medication management changes through collaboration with PharmD and provider.   Interventions: 1:1 collaboration with Lindell Spar, MD regarding development and update of comprehensive plan of care as evidenced by provider attestation and co-signature Inter-disciplinary care team collaboration (see longitudinal plan of care) Comprehensive medication review performed; medication list updated in electronic medical record  Hypertension: Blood pressure under good control. Blood pressure is at goal of <130/80 mmHg per 2017 AHA/ACC guidelines. Current  medications: lisinopril 20 mg by mouth once daily Patient has stopped taking amlodipine and has decreased lisinopril down to 56m daily Intolerances: none Taking medications as directed: yes Side effects thought to be attributed to current medication regimen: no Current diet: FODMOP (follows with Ms CHarriett Sinefor nutrition) Current exercise: not discussed today Current home blood pressure: 102-129/66-80 mmHg per patient verbal report Continue lisinopril 20 mg by mouth once daily Encourage dietary sodium restriction/DASH diet Recommend regular aerobic exercise Recommend home blood pressure monitoring to discuss at next visit Reviewed risks of hypertension, principles of treatment and consequences of untreated hypertension  Hyperlipidemia: Uncontrolled. LDL above goal of <70 due to very high risk given established clinical ASCVD (stroke in 1999) per 2020 AACE/ACE guidelines and TG above goal of <150 per 2020 AACE/ACE guidelines. Current medications: atorvastatin 40 mg by mouth once daily (increased on 03/16/21) Intolerances: none Taking medications as directed: yes Side effects thought to be attributed to current medication regimen: no Continue atorvastatin 40 mg by mouth every evening  Recommend regular aerobic exercise Reviewed risks of hyperlipidemia, principles of treatment and consequences of untreated hyperlipidemia Repeat lipid panel ordered today. Patient will come in and complete (fasting) over next week. Will discuss results with patient in 1 week. Consider adding ezetimibe 10 mg by mouth daily if LDL remains elevated above goal.   Asthma/Allergies: Controlled.  Current treatment: montelukast (Singulair) 10 mg by mouth once daily in the evening, Allegra and Flonase Patient denies the need for a rescue inhaler as she does not normally have shortness of breath unless she exerts herself more than normal Continue current medication management Recommend allergen avoidance  (environmental control) Encouraged regular physical activity for general health benefits  Chronic Kidney Disease Stage 3a - GFR 45-59 (Mild to Moderately Reduced Function): Suboptimally managed Current medications: lisinopril 20 mg by mouth once daily and atorvastatin 40 mg by mouth once daily Intolerances: none Taking  medications as directed: yes Side effects thought to be attributed to current medication regimen: no Most recent GFR: 55 mL/min Most recent microalbumin: unknown, will order to determine if patient would be a good candidate for SGLT2i or MRA therapy  Recommend adequate hydration  Recommend regular aerobic exercise Discussed need for improved control of cholesterol   Anxiety/Depression: Controlled per patient Current medications: venlafaxine 150 mg by mouth daily, alprazolam 1 mg three time daily as needed for anxiety, and trazodone 200 mg by mouth at bedtime Patient reports that she follows with a psych provider Continue current medication management   Patient Goals/Self-Care Activities Over the next 90 days, patient will:  Take medications as prescribed Check blood pressure at least once daily, document, and provide at future appointments Target a minimum of 150 minutes of moderate intensity exercise weekly Come get labs drawn (fasting) in the next few days  Follow Up Plan: Telephone follow up appointment with care management team member scheduled for: 05/19/21      Medication Assistance: None required.  Patient affirms current coverage meets needs.  Patient's preferred pharmacy is:  [facility], [address] [place] [address] Phone: [phone] Fax: [phone]  Follow Up:  Patient agrees to Care Plan and Follow-up.  Plan: Telephone follow up appointment with care management team member scheduled for:  05/19/21  Kennon Holter, PharmD Clinical Pharmacist [facility] Primary  Care [phone]

## 2021-05-12 ENCOUNTER — Other Ambulatory Visit: Payer: Self-pay

## 2021-05-12 ENCOUNTER — Ambulatory Visit: Payer: PPO | Attending: Internal Medicine

## 2021-05-12 ENCOUNTER — Other Ambulatory Visit (HOSPITAL_BASED_OUTPATIENT_CLINIC_OR_DEPARTMENT_OTHER): Payer: Self-pay

## 2021-05-12 DIAGNOSIS — Z23 Encounter for immunization: Secondary | ICD-10-CM

## 2021-05-12 DIAGNOSIS — N1831 Chronic kidney disease, stage 3a: Secondary | ICD-10-CM | POA: Diagnosis not present

## 2021-05-12 DIAGNOSIS — E782 Mixed hyperlipidemia: Secondary | ICD-10-CM | POA: Diagnosis not present

## 2021-05-12 MED ORDER — PFIZER COVID-19 VAC BIVALENT 30 MCG/0.3ML IM SUSP
INTRAMUSCULAR | 0 refills | Status: DC
  Filled 2021-05-12: qty 0.3, 1d supply, fill #0

## 2021-05-12 NOTE — Progress Notes (Signed)
   Covid-19 Vaccination Clinic  Name:  [name]    MRN: [id number] DOB: [birth year]  05/12/2021    [name] was observed post Covid-19 immunization for 15 minutes without incident. Diamond L Brede "[name]" was provided with Vaccine Information Sheet and instruction to access the V-Safe system.     [name] was instructed to call 911 with any severe reactions post vaccine: Difficulty breathing  Swelling of face and throat  A fast heartbeat  A bad rash all over body  Dizziness and weakness   Immunizations Administered     Name Date Dose VIS Date Route   Pfizer Covid-19 Vaccine Bivalent Booster 05/12/2021  2:12 PM 0.3 mL 03/03/2021 Intramuscular   Manufacturer: [name]   Lot: [id number]   [name]: [id number]

## 2021-05-14 LAB — LIPID PANEL
Chol/HDL Ratio: 3.1 ratio (ref 0.0–4.4)
Cholesterol, Total: 189 mg/dL (ref 100–199)
HDL: 61 mg/dL (ref >39)
LDL Chol Calc (NIH): 98 mg/dL (ref 0–99)
Triglycerides: 177 mg/dL — ABNORMAL HIGH (ref 0–149)
VLDL Cholesterol Cal: 30 mg/dL (ref 5–40)

## 2021-05-14 LAB — MICROALBUMIN / CREATININE URINE RATIO
Creatinine, Urine: 87.7 mg/dL
Microalb/Creat Ratio: 4 mg/g creat (ref 0–29)
Microalbumin, Urine: 3.6 ug/mL

## 2021-05-14 LAB — TSH: TSH: 2.43 u[IU]/mL (ref 0.450–4.500)

## 2021-05-18 ENCOUNTER — Encounter (INDEPENDENT_AMBULATORY_CARE_PROVIDER_SITE_OTHER): Payer: Self-pay

## 2021-05-19 ENCOUNTER — Ambulatory Visit: Payer: PPO | Admitting: Pharmacist

## 2021-05-19 DIAGNOSIS — F32A Depression, unspecified: Secondary | ICD-10-CM

## 2021-05-19 DIAGNOSIS — F419 Anxiety disorder, unspecified: Secondary | ICD-10-CM

## 2021-05-19 DIAGNOSIS — N1831 Chronic kidney disease, stage 3a: Secondary | ICD-10-CM

## 2021-05-19 DIAGNOSIS — J45909 Unspecified asthma, uncomplicated: Secondary | ICD-10-CM

## 2021-05-19 DIAGNOSIS — E782 Mixed hyperlipidemia: Secondary | ICD-10-CM

## 2021-05-19 DIAGNOSIS — I10 Essential (primary) hypertension: Secondary | ICD-10-CM

## 2021-05-19 MED ORDER — EZETIMIBE 10 MG PO TABS: 10.0000 mg | ORAL_TABLET | Freq: Every day | ORAL | 3 refills | Status: DC

## 2021-05-19 NOTE — Patient Instructions (Signed)
Keniesha L Lariviere,  It was great to talk to you today!  Please call me with any questions or concerns.   Visit Information  Patient Goals/Self-Care Activities Over the next 90 days, patient will:  Take medications as prescribed Check blood pressure at least once daily, document, and provide at future appointments Target a minimum of 150 minutes of moderate intensity exercise weekly  Patient verbalizes understanding of instructions provided today and agrees to view in [place].   Face to Face appointment with care management team member scheduled for: 07/07/21  Kennon Holter, PharmD Clinical Pharmacist [facility] Primary Care [phone]

## 2021-05-19 NOTE — Chronic Care Management (AMB) (Signed)
Chronic Care Management Pharmacy Note  05/19/2021 Name:  [name] MRN:  [id number] DOB:  1961-06-22  Summary:  Hyperlipidemia: Continue atorvastatin 40 mg by mouth every evening  Consider adding ezetimibe 10 mg by mouth daily since LDL remains elevated above goal. Will discuss with PCP.  For elevated triglycerides, recommend over the counter fish oil (omega-3s) 1-2 capsules per day.  Repeat lipid panel in 4-12 weeks  Subjective: [name] is an 60 y.o. year old female who is a primary patient of Lindell Spar, MD.  The CCM team was consulted for assistance with disease management and care coordination needs.    Engaged with patient by telephone for follow up visit in response to provider referral for pharmacy case management and/or care coordination services.   Consent to Services:  The patient was given information about Chronic Care Management services, agreed to services, and gave verbal consent prior to initiation of services.  Please see initial visit note for detailed documentation.   Patient Care Team: Lindell Spar, MD as PCP - General (Internal Medicine) Beryle Lathe, [facility] (Pharmacist)  Objective:  Lab Results  Component Value Date   CREATININE 1.16 (H) 04/14/2021   CREATININE 1.17 (H) 01/29/2021   CREATININE 1.07 (H) 08/31/2020    Lab Results  Component Value Date   HGBA1C 5.3 06/02/2017   Last diabetic Eye exam: No results found for: HMDIABEYEEXA  Last diabetic Foot exam: No results found for: HMDIABFOOTEX      Component Value Date/Time   CHOL 189 05/12/2021 0812   TRIG 177 (H) 05/12/2021 0812   HDL 61 05/12/2021 0812   CHOLHDL 3.1 05/12/2021 0812   CHOLHDL 2.3 09/16/2015 0939   VLDL 26 09/16/2015 0939   LDLCALC 98 05/12/2021 0812    Hepatic Function Latest Ref Rng & Units 01/29/2021 08/31/2020 06/02/2017  Total Protein 6.0 - 8.5 g/dL 7.0 6.9 7.4  Albumin 3.8 - 4.9 g/dL 4.6 4.4 5.1  AST 0 - 40 IU/L 27 38 19  ALT 0 - 32  IU/L 48(H) 60(H) 17  Alk Phosphatase 44 - 121 IU/L 69 77 80  Total Bilirubin 0.0 - 1.2 mg/dL 0.6 0.5 0.9    Lab Results  Component Value Date/Time   TSH 2.430 05/12/2021 08:12 AM   TSH 1.770 08/31/2020 08:39 AM   FREET4 1.11 08/31/2020 08:39 AM    CBC Latest Ref Rng & Units 04/14/2021 08/31/2020 07/10/2017  WBC 4.0 - 10.5 K/uL 10.9(H) 5.7 7.5  Hemoglobin 12.0 - 15.0 g/dL 13.2 13.5 13.3  Hematocrit 36.0 - 46.0 % 38.4 39.9 39.9  Platelets 150 - 400 K/uL 313 289 304    Lab Results  Component Value Date/Time   VD25OH 41.6 08/31/2020 08:39 AM   VD25OH 36.8 06/02/2017 09:22 AM    Clinical ASCVD: Yes  The 10-year ASCVD risk score (Arnett DK, et al., 2019) is: 3.4%   Values used to calculate the score:     Age: 25 years     Sex: Female     Is Non-Hispanic African American: No     Diabetic: No     Tobacco smoker: No     Systolic Blood Pressure: 209 mmHg     Is BP treated: Yes     HDL Cholesterol: 61 mg/dL     Total Cholesterol: 189 mg/dL    Social History   Tobacco Use  Smoking Status Former   Packs/day: 1.00   Years: 19.00   Pack years: 19.00  Types: Cigarettes   Quit date: 09/01/1997   Years since quitting: 23.7  Smokeless Tobacco Never  Tobacco Comments   Quit smoking 1999 , previous 20 pack years   BP Readings from Last 3 Encounters:  05/04/21 133/79  04/16/21 107/68  04/14/21 124/77   Pulse Readings from Last 3 Encounters:  04/16/21 71  04/14/21 77  04/12/21 75   Wt Readings from Last 3 Encounters:  05/03/21 138 lb 12.8 oz (63 kg)  04/14/21 136 lb (61.7 kg)  04/12/21 140 lb (63.5 kg)    Assessment: Review of patient past medical history, allergies, medications, health status, including review of consultants reports, laboratory and other test data, was performed as part of comprehensive evaluation and provision of chronic care management services.   SDOH:  (Social Determinants of Health) assessments and interventions performed:    CCM Care  Plan  Allergies  Allergen Reactions   Morphine And Related Hives   Promethazine Hcl Other (See Comments)    Causes patient to become Hyper    Medications Reviewed Today     Reviewed by Beryle Lathe, [facility] (Pharmacist) on 05/11/21 at 1136  Med List Status: <None>   Medication Order Taking? Sig Documenting Provider Last Dose Status Informant  acetaminophen (TYLENOL) 500 MG tablet [id number] Yes Take 500-1,000 mg by mouth every 6 (six) hours as needed for moderate pain. [provider] Taking Active Self  ALPRAZolam Duanne Moron) 1 MG tablet [id number] Yes Take 1 tablet (1 mg total) by mouth 3 (three) times daily. Cloria Spring, MD Taking Active   atorvastatin (LIPITOR) 40 MG tablet [id number] Yes Take 1 tablet (40 mg total) by mouth daily. Lindell Spar, MD Taking Active            Med Note Beryle Lathe   Tue May 11, 2021 11:11 AM)    Calcium 500-125 MG-UNIT TABS [id number] Yes Take 1 tablet by mouth daily with supper. [provider] Taking Active Self  Carboxymethylcellulose Sod PF 0.5 % SOLN [id number] Yes Place 1 drop into both eyes daily as needed (dry eyes). [provider] Taking Active Self  diclofenac (CATAFLAM) 50 MG tablet [id number] Yes Take 1 tablet (50 mg total) by mouth 2 (two) times daily. Carole Civil, MD Taking Active Self  diclofenac Sodium (VOLTAREN) 1 % GEL [id number] Yes Apply 1 application topically daily as needed (pain). [provider] Taking Active Self  dicyclomine (BENTYL) 10 MG capsule [id number] Yes Take 1 capsule (10 mg total) by mouth every 12 (twelve) hours as needed (abdominal pain). Montez Morita, Quillian Quince, MD Taking Active Self  famotidine (PEPCID) 20 MG tablet [id number] Yes Take 20 mg by mouth daily as needed. [provider] Taking Active Self           Med Note Beryle Lathe   Tue May 11, 2021 11:12 AM)    fexofenadine (ALLEGRA) 180 MG tablet [id number] Yes Take 180 mg by mouth  daily. [provider] Taking Active Self  fluticasone (FLONASE) 50 MCG/ACT nasal spray [id number] Yes Place 2 sprays into both nostrils daily. [provider] Taking Active Self  lisinopril (ZESTRIL) 20 MG tablet [id number] Yes Take 1 tablet (20 mg total) by mouth 2 (two) times daily.  Patient taking differently: Take 20 mg by mouth daily.   Lindell Spar, MD Taking Active            Med Note Beryle Lathe   Tue May 11, 2021 11:12 AM)  lubiprostone (AMITIZA) 24 MCG capsule [id number] Yes Take 1 capsule (24 mcg total) by mouth 2 (two) times daily with a meal. Montez Morita, Quillian Quince, MD Taking Active Self           Med Note Darylene Price May 03, 2021  2:42 PM)  EOD  methylphenidate (RITALIN) 20 MG tablet [id number] Yes Take 1 tablet (20 mg total) by mouth 2 (two) times daily with breakfast and lunch. Cloria Spring, MD Taking Active   montelukast (SINGULAIR) 10 MG tablet [id number] Yes TAKE (1) TABLET BY MOUTH AT BEDTIME. Lindell Spar, MD Taking Active   Multiple Vitamin [facility] WITH MINERALS) TABS [id number] Yes Take 1 tablet by mouth daily with breakfast. [provider] Taking Active Self  nabumetone (RELAFEN) 500 MG tablet [id number] Yes Take 500 mg by mouth every 8 (eight) hours as needed (joint pain). [provider] Taking Active Self  nystatin (MYCOSTATIN/NYSTOP) powder [id number] Yes Apply 1 application topically 3 (three) times daily.  Patient taking differently: Apply 1 application topically 3 (three) times daily as needed.   Lindell Spar, MD Taking Active   Olopatadine HCl 0.2 % SOLN [id number] Yes Place 1 drop into both eyes in the morning and at bedtime. [provider] Taking Active Self  omeprazole (PRILOSEC) 40 MG capsule [id number] Yes TAKE ONE CAPSULE BY MOUTH TWICE DAILY. Montez Morita, Quillian Quince, MD Taking Active   oxybutynin (DITROPAN-XL) 10 MG 24 hr tablet [id number] Yes TAKE (1) TABLET BY MOUTH AT  BEDTIME Lindell Spar, MD Taking Active   Probiotic Product (TRUBIOTICS PO) [id number] Yes Take 1 capsule by mouth daily. Takes 25 cfu daily. [provider] Taking Active Self  pyridOXINE (VITAMIN B-6) 100 MG tablet [id number] Yes Take 100 mg by mouth daily. [provider] Taking Active Self  traZODone (DESYREL) 100 MG tablet [id number] Yes Take 2 tablets (200 mg total) by mouth at bedtime. Cloria Spring, MD Taking Active   venlafaxine XR (EFFEXOR XR) 150 MG 24 hr capsule [id number] Yes Take 1 capsule (150 mg total) by mouth daily with breakfast. 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   History of hemorrhagic stroke with residual hemiplegia ([place]) 06/15/2020   Urinary incontinence 06/15/2020   Asthma due to seasonal allergies 06/15/2020   Vaginal itching 03/16/2020   S/P right knee arthroscopy 07/13/17 07/20/2017   Derangement of posterior horn of medial meniscus of right knee    Chondromalacia, patella, right    IBS (irritable bowel syndrome) 05/11/2015   Gait instability 01/06/2015   Dyspareunia, female 12/11/2014   DDD (degenerative disc disease), lumbar 02/18/2014   Elevated levels of transaminase & lactic acid dehydrogenase 12/16/2013   Cerebral infarction ([place]) 11/01/2013   Postmenopausal atrophic vaginitis 11/01/2013   Lipoma of abdominal wall 08/14/2013   Back pain 05/29/2013   Paresthesia of foot 02/12/2013   Hypertension    GERD (gastroesophageal reflux disease)    Depression 11/17/2011   Insomnia 11/17/2011   Mixed hyperlipidemia 09/20/2011    Anxiety 09/20/2011  Immunization History  Administered Date(s) Administered   Influenza Split 08/13/2012, 05/11/2013   Influenza,inj,Quad PF,6+ Mos 05/11/2015, 03/24/2019, 05/10/2021   Influenza-Unspecified 05/10/2014, 04/17/2017, 03/16/2020   PFIZER(Purple Top)SARS-COV-2 Vaccination 09/26/2019, 10/19/2019, 08/12/2020   Pfizer Covid-19 Vaccine Bivalent Booster 81yr & up 05/12/2021   Tdap 04/30/2008, 02/12/2013    Conditions to be addressed/monitored: HTN, HLD, CKD Stage 3a, Anxiety, Depression, and Asthma  Care Plan : Medication Management  Updates made by WBeryle Lathe RMelrosesince 05/19/2021 12:00 AM     Problem: HTN, HLD, CKD, Asthma/Allergies, Anxiety/Depression   Priority: High  Onset Date: 03/11/2021     Long-Range Goal: Disease Progression Prevention   Start Date: 03/11/2021  Expected End Date: 06/09/2021  Recent Progress: On track  Priority: High  Note:   Current Barriers:  Unable to achieve control of hyperlipidemia  Pharmacist Clinical Goal(s):  Over the next 90 days, patient will Achieve control of hyperlipidemia as evidenced by improved LDL and improved triglycerides through collaboration with PharmD and provider.   Interventions: 1:1 collaboration with PLindell Spar MD regarding development and update of comprehensive plan of care as evidenced by provider attestation and co-signature Inter-disciplinary care team collaboration (see longitudinal plan of care) Comprehensive medication review performed; medication list updated in electronic medical record  Hypertension: Blood pressure under good control. Blood pressure is at goal of <130/80 mmHg per 2017 AHA/ACC guidelines. Current medications: lisinopril 20 mg by mouth once daily Patient has stopped taking amlodipine and has self-decreased lisinopril down to 228mdaily Intolerances: none Taking medications as directed: yes Side effects thought to be attributed to current medication regimen:  no Current diet: modified FODMOP/DASH diet (follows with Ms CrHarriett Sineor nutrition) Current exercise: not discussed today Current home blood pressure: 113/78, 117/76, 125/79, 131/84, 134/82, 122/78 mmHg per patient verbal report Continue lisinopril 20 mg by mouth once daily Encourage dietary sodium restriction/DASH diet Recommend regular aerobic exercise Recommend home blood pressure monitoring to discuss at next visit Reviewed risks of hypertension, principles of treatment and consequences of untreated hypertension  Hyperlipidemia: Uncontrolled. LDL above goal of <70 due to very high risk given established clinical ASCVD (stroke in 1999) per 2020 AACE/ACE guidelines and TG above goal of <150 per 2020 AACE/ACE guidelines. Current medications: atorvastatin 40 mg by mouth once daily Intolerances: none Taking medications as directed: yes Side effects thought to be attributed to current medication regimen: no Continue atorvastatin 40 mg by mouth every evening  Recommend regular aerobic exercise Reviewed risks of hyperlipidemia, principles of treatment and consequences of untreated hyperlipidemia Consider adding ezetimibe 10 mg by mouth daily since LDL remains elevated above goal. Will discuss with PCP.  For elevated triglycerides, recommend over the counter fish oil (omega-3s) 1-2 capsules per day.  Repeat lipid panel in 4-12 weeks  Asthma/Allergies: Controlled.  Current treatment: montelukast (Singulair) 10 mg by mouth once daily in the evening, Allegra and Flonase Patient denies the need for a rescue inhaler as she does not normally have shortness of breath unless she exerts herself more than normal Continue current medication management Recommend allergen avoidance (environmental control) Encouraged regular physical activity for general health benefits  Chronic Kidney Disease Stage 3a - GFR 45-59 (Mild to Moderately Reduced Function): Appropriately managed Current medications:  lisinopril 20 mg by mouth once daily and atorvastatin 40 mg by mouth once daily Intolerances: none Taking medications as directed: yes Side effects thought to be attributed to current medication regimen: no Most recent GFR: 55 mL/min Most recent microalbumin/creatinine ratio: within normal limits Recommend  adequate hydration  Recommend regular aerobic exercise Discussed need for improved control of cholesterol  Patient does not meet criteria for strong consideration of SGLT2 inhibitor therapy at this time  Anxiety/Depression: Controlled per patient Current medications: venlafaxine 150 mg by mouth daily, alprazolam 1 mg three time daily as needed for anxiety, and trazodone 200 mg by mouth at bedtime Patient reports that she follows with a psych provider Continue current medication management   Patient Goals/Self-Care Activities Over the next 90 days, patient will:  Take medications as prescribed Check blood pressure at least once daily, document, and provide at future appointments Target a minimum of 150 minutes of moderate intensity exercise weekly  Follow Up Plan: Face to Face appointment with care management team member scheduled for: 07/07/21      Medication Assistance: None required.  Patient affirms current coverage meets needs.  Patient's preferred pharmacy is:  [facility], [address] [place] [address] Phone: [phone] Fax: [phone]  Follow Up:  Patient agrees to Care Plan and Follow-up.  Plan: Face to Face appointment with care management team member scheduled for: 07/07/21  Kennon Holter, PharmD Clinical Pharmacist [facility] [phone]

## 2021-05-22 ENCOUNTER — Other Ambulatory Visit: Payer: Self-pay | Admitting: Internal Medicine

## 2021-05-22 DIAGNOSIS — J45909 Unspecified asthma, uncomplicated: Secondary | ICD-10-CM

## 2021-05-24 ENCOUNTER — Ambulatory Visit (HOSPITAL_COMMUNITY): Payer: PPO

## 2021-05-25 ENCOUNTER — Encounter: Payer: Self-pay | Admitting: Nutrition

## 2021-05-31 ENCOUNTER — Ambulatory Visit (INDEPENDENT_AMBULATORY_CARE_PROVIDER_SITE_OTHER): Payer: PPO | Admitting: Gastroenterology

## 2021-05-31 ENCOUNTER — Other Ambulatory Visit: Payer: Self-pay

## 2021-05-31 ENCOUNTER — Encounter (INDEPENDENT_AMBULATORY_CARE_PROVIDER_SITE_OTHER): Payer: Self-pay | Admitting: Gastroenterology

## 2021-05-31 ENCOUNTER — Telehealth: Payer: Self-pay | Admitting: Adult Health

## 2021-05-31 VITALS — BP 118/74 | HR 76 | Temp 97.6°F | Ht 63.0 in | Wt 138.0 lb

## 2021-05-31 DIAGNOSIS — K317 Polyp of stomach and duodenum: Secondary | ICD-10-CM

## 2021-05-31 DIAGNOSIS — K7581 Nonalcoholic steatohepatitis (NASH): Secondary | ICD-10-CM | POA: Diagnosis not present

## 2021-05-31 DIAGNOSIS — K581 Irritable bowel syndrome with constipation: Secondary | ICD-10-CM | POA: Diagnosis not present

## 2021-05-31 NOTE — Progress Notes (Signed)
Maylon Peppers, M.D. Gastroenterology & Hepatology [facility] [address] [place], [place] [address]  Primary Care Physician: Lindell Spar, MD [address] [place] [address]  I will communicate my assessment and recommendations to the referring MD via EMR.  Problems: IBS-C Fundic gland polyp with low-grade dysplasia -due for surveillance in 2024 NASH  History of Present Illness: [name] is a 60 y.o. female with past medical history of bipolar disorder, cervical cancer, depression, hyperlipidemia, hypertension, IBS, stroke, CKD stage III and NASH, who presents for evaluation of chronic constipation and diarrhea, follow-up of IBS-C.  The patient was last seen on 04/01/2021. At that time, the patient was scheduled to undergo an EGD with findings described below.  Celiac serologies were checked which were negative.  She was given a prescription for Bentyl 1 tablet every 12 hours and she was increased on her dosage of Amitiza to 24 mcg every 12 hours. Underwent an abdominal ultrasound that showed hepatic cytosis without any other changes.  She was also advised to follow a low FODMAP diet.  As she was having issues with her bowel movements, it was finally decided she would take the Amitiza every other day and MiraLAX on a daily basis.  The patient states that she has felt some improvement of her symptoms after she change her diet which she has been trying to modify for a combination of low FODMAP and DASHdiet.  Has not been able to lose much weight but overall states that she has very infrequent episodes of pain in her right upper quadrant.  In fact she has been able to move her bowels more frequently almost on a daily basis with the current regimen of medication. The patient denies having any nausea, vomiting, fever, chills, hematochezia, melena, hematemesis, abdominal distention, jaundice, pruritus or weight loss.  Last EGD:  04/16/2021, no alterations were found in the esophagus, this was empirically dilated with a Savary dilator up to 18 mm.  Biopsies from esophagus were within normal limits.  There were a few erosions in the stomach.  Biopsies were negative for H. pylori.  A 12 mm polyp was snared from the gastric fundus which showed fundic gland polyp with low-grade dysplasia.  Duodenum was normal with biopsies negative for any adenopathies.  Last Colonoscopy: 08/2018 -performed by Dr. Carol Ada.  Per report within normal limits, had biopsies of the right and left colon which were within normal limits. Last EUS 2017 - The pancreatic parenchyma was normal as well as the PD caliber. The PD was very small and difficult to measure. The CBD was normal in size (4.3 mm) and there was no evidence of any stones. No gallbladder was visualized, which is expected in her post cholecystectomy state. No other abnormalities were identified.    Past Medical History: Past Medical History:  Diagnosis Date   Allergy    grass, dust , mold   Anxiety    Arthritis    Asthma due to seasonal allergies 06/15/2020   Bipolar disorder ([place])    Cancer (HCC)    cervical cancer   Carpal tunnel syndrome    Bilateral   Chest pain 09/2011   Cardiac cath-normal coronaries   Constipation    Depression    Difficulty urinating 05/31/2013   Elevated LFTs 12/16/2013   GERD (gastroesophageal reflux disease)    History of kidney stones    Hyperlipemia    Hyperlipidemia    Hypertension    Mild;  provoked by stress and anxiety   IBS (irritable bowel syndrome)    Intracerebral bleed (HCC)    No aneurysm; followed by Dr. Sherwood Gambler   Loss of weight 01/06/2015   Osteoporosis    Stage 3a chronic kidney disease ([place]) 03/16/2021   Stroke ([place]) 1999   hemorrhagic stroke; weakness of left side    Past Surgical History: Past Surgical History:  Procedure Laterality Date   ABDOMINAL HYSTERECTOMY     "cancer cells"   BIOPSY  04/16/2021   Procedure:  BIOPSY;  Surgeon: Harvel Quale, MD;  [facility];  Service: Gastroenterology;;  small, bowel, esophageal(proximal and distal);   BRAIN SURGERY  1999   to remove blood clot after stroke    CARDIAC CATHETERIZATION  2016   CERVICAL FUSION     CHOLECYSTECTOMY N/A 10/14/2014   Procedure: LAPAROSCOPIC CHOLECYSTECTOMY WITH INTRAOPERATIVE CHOLANGIOGRAM;  Surgeon: Jackolyn Confer, MD;  Location: [facility];  Service: General;  Laterality: N/A;   CHONDROPLASTY Right 07/13/2017   Procedure: CHONDROPLASTY of patella;  Surgeon: Carole Civil, MD;  Location: [facility];  Service: Orthopedics;  Laterality: Right;   ESOPHAGEAL DILATION N/A 04/16/2021   Procedure: ESOPHAGEAL DILATION;  Surgeon: Harvel Quale, MD;  [facility];  Service: Gastroenterology;  Laterality: N/A;   ESOPHAGOGASTRODUODENOSCOPY (EGD) WITH PROPOFOL N/A 04/16/2021   Procedure: ESOPHAGOGASTRODUODENOSCOPY (EGD) WITH PROPOFOL;  Surgeon: Harvel Quale, MD;  [facility];  Service: Gastroenterology;  Laterality: N/A;  1:35, pt knows to arrive at 9:45   EUS N/A 08/21/2015   Procedure: ESOPHAGEAL ENDOSCOPIC ULTRASOUND (EUS) RADIAL;  Surgeon: Carol Ada, MD;  Location: [facility];  Service: Endoscopy;  Laterality: N/A;   KNEE ARTHROSCOPY WITH MEDIAL MENISECTOMY Right 07/13/2017   Procedure: KNEE ARTHROSCOPY WITH PARTIAL MEDIAL MENISECTOMY;  Surgeon: Carole Civil, MD;  Location: [facility];  Service: Orthopedics;  Laterality: Right;   LEFT HEART CATHETERIZATION WITH CORONARY ANGIOGRAM N/A 09/23/2011   Procedure: LEFT HEART CATHETERIZATION WITH CORONARY ANGIOGRAM;  Surgeon: Thayer Headings, MD;  Location: [facility] C[facility];  Service: Cardiovascular;  Laterality: N/A;   LIPOMA EXCISION Left 11/18/2013   Procedure: EXCISION OF SOFT TISSUE MASS-LEFT THIGH;  Surgeon: Jamesetta So, MD;  Location: [facility];  Service: General;  Laterality: Left;   POLYPECTOMY  04/16/2021   Procedure:  POLYPECTOMY;  Surgeon: Harvel Quale, MD;  [facility];  Service: Gastroenterology;;  gastric   RECTOCELE REPAIR     x2   RECTOCELE REPAIR N/A 04/04/2017   Procedure: POSTERIOR REPAIR (RECTOCELE);  Surgeon: Jonnie Kind, MD;  Location: [facility];  Service: Gynecology;  Laterality: N/A;    Family History: Family History  Problem Relation Age of Onset   Cancer Mother        breast    Hypertension Mother    Hyperlipidemia Mother    Depression Mother    Anxiety disorder Mother    COPD Mother    Arthritis Mother        rheumatoid   Drug abuse Sister    Coronary artery disease Paternal Grandfather    Coronary artery disease Paternal Uncle    Depression Cousin    Drug abuse Cousin     Social History: Social History   Tobacco Use  Smoking Status Former   Packs/day: 1.00   Years: 19.00   Pack years: 19.00   Types: Cigarettes   Quit date: 09/01/1997   Years since quitting: 23.7  Smokeless Tobacco Never  Tobacco Comments  Quit smoking 1999 , previous 20 pack years   Social History   Substance and Sexual Activity  Alcohol Use Yes   Comment: 1 drink every other week   Social History   Substance and Sexual Activity  Drug Use No    Allergies: Allergies  Allergen Reactions   Morphine And Related Hives   Promethazine Hcl Other (See Comments)    Causes patient to become Hyper    Medications: Current Outpatient Medications  Medication Sig Dispense Refill   acetaminophen (TYLENOL) 500 MG tablet Take 500-1,000 mg by mouth every 6 (six) hours as needed for moderate pain.     ALPRAZolam (XANAX) 1 MG tablet Take 1 tablet (1 mg total) by mouth 3 (three) times daily. 90 tablet 2   atorvastatin (LIPITOR) 40 MG tablet Take 1 tablet (40 mg total) by mouth daily. 90 tablet 3   Calcium 500-125 MG-UNIT TABS Take 1 tablet by mouth daily with supper.     Carboxymethylcellulose Sod PF 0.5 % SOLN Place 1 drop into both eyes daily as needed (dry eyes).      COVID-19 mRNA bivalent vaccine, Pfizer, (PFIZER COVID-19 VAC BIVALENT) injection Inject into the muscle. 0.3 mL 0   diclofenac (CATAFLAM) 50 MG tablet Take 1 tablet (50 mg total) by mouth 2 (two) times daily. 90 tablet 3   diclofenac Sodium (VOLTAREN) 1 % GEL Apply 1 application topically daily as needed (pain).     dicyclomine (BENTYL) 10 MG capsule Take 1 capsule (10 mg total) by mouth every 12 (twelve) hours as needed (abdominal pain). 60 capsule 2   ezetimibe (ZETIA) 10 MG tablet Take 1 tablet (10 mg total) by mouth daily. 90 tablet 3   famotidine (PEPCID) 20 MG tablet Take 20 mg by mouth daily as needed.     fexofenadine (ALLEGRA) 180 MG tablet Take 180 mg by mouth daily.     fluticasone (FLONASE) 50 MCG/ACT nasal spray Place 2 sprays into both nostrils daily.     lisinopril (ZESTRIL) 20 MG tablet Take 1 tablet (20 mg total) by mouth 2 (two) times daily. (Patient taking differently: Take 20 mg by mouth daily.) 180 tablet 1   lubiprostone (AMITIZA) 24 MCG capsule Take 1 capsule (24 mcg total) by mouth 2 (two) times daily with a meal. 180 capsule 1   methylphenidate (RITALIN) 20 MG tablet Take 1 tablet (20 mg total) by mouth 2 (two) times daily with breakfast and lunch. 60 tablet 0   montelukast (SINGULAIR) 10 MG tablet TAKE (1) TABLET BY MOUTH AT BEDTIME. 30 tablet 3   Multiple Vitamin (MULITIVITAMIN WITH MINERALS) TABS Take 1 tablet by mouth daily with breakfast.     nabumetone (RELAFEN) 500 MG tablet Take 500 mg by mouth every 8 (eight) hours as needed (joint pain).     nystatin (MYCOSTATIN/NYSTOP) powder Apply 1 application topically 3 (three) times daily. (Patient taking differently: Apply 1 application topically 3 (three) times daily as needed.) 15 g 2   Olopatadine HCl 0.2 % SOLN Place 1 drop into both eyes in the morning and at bedtime.     omeprazole (PRILOSEC) 40 MG capsule TAKE ONE CAPSULE BY MOUTH TWICE DAILY. 180 capsule 0   oxybutynin (DITROPAN-XL) 10 MG 24 hr tablet TAKE (1) TABLET  BY MOUTH AT BEDTIME 90 tablet 0   Probiotic Product (TRUBIOTICS PO) Take 1 capsule by mouth daily. Takes 25 cfu daily.     pyridOXINE (VITAMIN B-6) 100 MG tablet Take 100 mg by mouth daily.  traZODone (DESYREL) 100 MG tablet Take 2 tablets (200 mg total) by mouth at bedtime. 60 tablet 2   venlafaxine XR (EFFEXOR XR) 150 MG 24 hr capsule Take 1 capsule (150 mg total) by mouth daily with breakfast. 90 capsule 2   No current facility-administered medications for this visit.    Review of Systems: GENERAL: negative for malaise, night sweats HEENT: No changes in hearing or vision, no nose bleeds or other nasal problems. NECK: Negative for lumps, goiter, pain and significant neck swelling RESPIRATORY: Negative for cough, wheezing CARDIOVASCULAR: Negative for chest pain, leg swelling, palpitations, orthopnea GI: SEE HPI MUSCULOSKELETAL: Negative for joint pain or swelling, back pain, and muscle pain. SKIN: Negative for lesions, rash PSYCH: Negative for sleep disturbance, mood disorder and recent psychosocial stressors. HEMATOLOGY Negative for prolonged bleeding, bruising easily, and swollen nodes. ENDOCRINE: Negative for cold or heat intolerance, polyuria, polydipsia and goiter. NEURO: negative for tremor, gait imbalance, syncope and seizures. The remainder of the review of systems is noncontributory.   Physical Exam: BP 118/74 (BP Location: Right Arm, Patient Position: Sitting, Cuff Size: Normal)   Pulse 76   Temp 97.6 F (36.4 C) (Oral)   Ht 5' 3"  (1.6 m)   Wt 138 lb 0.3 oz (62.6 kg)   BMI 24.45 kg/m  GENERAL: The patient is AO x3, in no acute distress. HEENT: Head is normocephalic and atraumatic. EOMI are intact. Mouth is well hydrated and without lesions. NECK: Supple. No masses LUNGS: Clear to auscultation. No presence of rhonchi/wheezing/rales. Adequate chest expansion HEART: RRR, normal s1 and s2. ABDOMEN: Soft, nontender, no guarding, no peritoneal signs, and nondistended.  BS +. No masses. EXTREMITIES: Without any cyanosis, clubbing, rash, lesions or edema. NEUROLOGIC: AOx3, no focal motor deficit. SKIN: no jaundice, no rashes  Imaging/Labs: as above  I personally reviewed and interpreted the available labs, imaging and endoscopic files.  Impression and Plan: [name] is a 60 y.o. female with past medical history of bipolar disorder, cervical cancer, depression, hyperlipidemia, hypertension, IBS, stroke, CKD stage III and NASH, who presents for evaluation of chronic constipation and diarrhea, follow-up of IBS-C.  The patient has presented major improvement of her gastrointestinal symptoms with recommendation of dietary changes but also while implementing a mix of MiraLAX and Amitiza.  I encouraged her to continue this regimen as she has presented a substantial improvement of her symptoms.  She will continue modifying her diet so she can have some benefits in terms of health but also to relieve her symptoms.  Regarding her NASH, we will repeat a CMP in a year to see if her aminotransferases have improved even further.  She has a low BMI so her NASH may be related to genetic please position and hyperlipidemia.  Finally, she was found to have a fundic gland polyp with low-grade dysplasia recently.  We will need to repeat an EGD for surveillance in 2 years.  -Continue current dietary regimen - Continue Miralax daily - Continue Amitiza 24 mcg BID every other day - Continue Bentyl 10 mg as needed for abdominal pain - Repeat CMP in one year -Repeat EGD in 2 years  All questions were answered.      Harvel Quale, MD Gastroenterology and Hepatology [facility]

## 2021-05-31 NOTE — Patient Instructions (Signed)
Continue current dietary regimen Continue Miralax daily Continue Amitiza every other day Continue Bentyl

## 2021-05-31 NOTE — Telephone Encounter (Signed)
See mychart.  

## 2021-06-02 ENCOUNTER — Ambulatory Visit: Payer: PPO | Admitting: Nutrition

## 2021-06-02 ENCOUNTER — Other Ambulatory Visit: Payer: Self-pay | Admitting: Adult Health

## 2021-06-02 DIAGNOSIS — J45909 Unspecified asthma, uncomplicated: Secondary | ICD-10-CM

## 2021-06-02 DIAGNOSIS — N941 Unspecified dyspareunia: Secondary | ICD-10-CM

## 2021-06-02 DIAGNOSIS — I129 Hypertensive chronic kidney disease with stage 1 through stage 4 chronic kidney disease, or unspecified chronic kidney disease: Secondary | ICD-10-CM | POA: Diagnosis not present

## 2021-06-02 DIAGNOSIS — F32A Depression, unspecified: Secondary | ICD-10-CM

## 2021-06-02 DIAGNOSIS — N1831 Chronic kidney disease, stage 3a: Secondary | ICD-10-CM

## 2021-06-02 DIAGNOSIS — E782 Mixed hyperlipidemia: Secondary | ICD-10-CM | POA: Diagnosis not present

## 2021-06-02 NOTE — Progress Notes (Signed)
Referred to [facility] gyn urology ?? PT

## 2021-06-22 ENCOUNTER — Encounter (INDEPENDENT_AMBULATORY_CARE_PROVIDER_SITE_OTHER): Payer: Self-pay | Admitting: Gastroenterology

## 2021-06-22 ENCOUNTER — Other Ambulatory Visit (INDEPENDENT_AMBULATORY_CARE_PROVIDER_SITE_OTHER): Payer: Self-pay | Admitting: Gastroenterology

## 2021-06-22 DIAGNOSIS — K582 Mixed irritable bowel syndrome: Secondary | ICD-10-CM

## 2021-06-22 NOTE — Telephone Encounter (Signed)
Last seen 05/31/2021  for IBS

## 2021-06-22 NOTE — Telephone Encounter (Signed)
Pt asking for refill on bentyl. Last office visit 05/31/21

## 2021-07-01 ENCOUNTER — Encounter (INDEPENDENT_AMBULATORY_CARE_PROVIDER_SITE_OTHER): Payer: Self-pay | Admitting: Gastroenterology

## 2021-07-01 ENCOUNTER — Other Ambulatory Visit (INDEPENDENT_AMBULATORY_CARE_PROVIDER_SITE_OTHER): Payer: Self-pay | Admitting: Gastroenterology

## 2021-07-01 DIAGNOSIS — G8929 Other chronic pain: Secondary | ICD-10-CM

## 2021-07-01 NOTE — Progress Notes (Signed)
Hi Darius Bump,   Can you please schedule a CT angio abdomen pelvis? Dx: chronic abdominal pain  Thanks,  Maylon Peppers, MD Gastroenterology and Hepatology [facility]

## 2021-07-07 ENCOUNTER — Other Ambulatory Visit: Payer: Self-pay

## 2021-07-07 ENCOUNTER — Ambulatory Visit (INDEPENDENT_AMBULATORY_CARE_PROVIDER_SITE_OTHER): Payer: PPO | Admitting: Pharmacist

## 2021-07-07 ENCOUNTER — Ambulatory Visit (INDEPENDENT_AMBULATORY_CARE_PROVIDER_SITE_OTHER): Payer: PPO | Admitting: Internal Medicine

## 2021-07-07 ENCOUNTER — Encounter: Payer: Self-pay | Admitting: Internal Medicine

## 2021-07-07 VITALS — BP 112/64 | HR 79 | Resp 18 | Ht 63.0 in | Wt 135.1 lb

## 2021-07-07 DIAGNOSIS — D7589 Other specified diseases of blood and blood-forming organs: Secondary | ICD-10-CM | POA: Diagnosis not present

## 2021-07-07 DIAGNOSIS — Z1159 Encounter for screening for other viral diseases: Secondary | ICD-10-CM

## 2021-07-07 DIAGNOSIS — R7303 Prediabetes: Secondary | ICD-10-CM | POA: Diagnosis not present

## 2021-07-07 DIAGNOSIS — Z0001 Encounter for general adult medical examination with abnormal findings: Secondary | ICD-10-CM | POA: Insufficient documentation

## 2021-07-07 DIAGNOSIS — I10 Essential (primary) hypertension: Secondary | ICD-10-CM

## 2021-07-07 DIAGNOSIS — N1831 Chronic kidney disease, stage 3a: Secondary | ICD-10-CM

## 2021-07-07 DIAGNOSIS — Z23 Encounter for immunization: Secondary | ICD-10-CM

## 2021-07-07 DIAGNOSIS — J45909 Unspecified asthma, uncomplicated: Secondary | ICD-10-CM

## 2021-07-07 DIAGNOSIS — E559 Vitamin D deficiency, unspecified: Secondary | ICD-10-CM | POA: Diagnosis not present

## 2021-07-07 DIAGNOSIS — E782 Mixed hyperlipidemia: Secondary | ICD-10-CM

## 2021-07-07 DIAGNOSIS — F419 Anxiety disorder, unspecified: Secondary | ICD-10-CM

## 2021-07-07 DIAGNOSIS — F32A Depression, unspecified: Secondary | ICD-10-CM

## 2021-07-07 HISTORY — DX: Encounter for general adult medical examination with abnormal findings: Z00.01

## 2021-07-07 MED ORDER — LISINOPRIL 20 MG PO TABS: 20.0000 mg | ORAL_TABLET | Freq: Every day | ORAL | 1 refills | Status: DC

## 2021-07-07 NOTE — Assessment & Plan Note (Signed)
Last BMP showed GFR of 55 Previous BMPs showed GFR > 60. Avoid nephrotoxic agents including NSAIDs, had told her to avoid Nabumetone in the last visit

## 2021-07-07 NOTE — Patient Instructions (Addendum)
Please continue taking medications as prescribed.  Please continue to follow low salt diet and ambulate as tolerated.  Please avoid taking Nabumetone while taking Diclofenac.  Please get fasting blood tests done before the next visit.  Please consider getting Shingrix vaccine at your local pharmacy.

## 2021-07-07 NOTE — Assessment & Plan Note (Signed)
BP Readings from Last 1 Encounters:  07/07/21 112/64   Well-controlled with lisinopril 20 mg QD Counseled for compliance with the medications Advised DASH diet and moderate exercise/walking, at least 150 mins/week

## 2021-07-07 NOTE — Assessment & Plan Note (Signed)
Physical exam as documented above. Fasting labs ordered. PCV20 in the office today.

## 2021-07-07 NOTE — Progress Notes (Signed)
Established Patient Office Visit  Subjective:  Patient ID: [name], female    DOB: 10/18/1960  Age: 61 y.o. MRN: [id number]  CC:  Chief Complaint  Patient presents with   Annual Exam    Annual exam pt has been checking bp at home been running fine     HPI [name] is a 61 y.o. female with past medical history of HTN, CKD, asthma due to seasonal allergies, h/o hemorrhagic stroke in 1999 (unclear etiology), HLD, anxiety with depression, urinary incontinence and GERD who presents for annual physical.  HTN: BP is well-controlled. Takes medications regularly.  She has been taking lisinopril 20 mg daily. Patient denies headache, dizziness, chest pain, dyspnea or palpitations.  HLD: She is on Lipitor and Zetia currently, tolerating them well.  She complains of chronic urinary urgency and incontinence, for which she takes oxybutynin.  She wants to continue taking it for now.  She denies any dysuria or hematuria currently.  She received PCV20 in the office today.  Past Medical History:  Diagnosis Date   Allergy    grass, dust , mold   Anxiety    Arthritis    Asthma due to seasonal allergies 06/15/2020   Bipolar disorder ([place])    Cancer ([place])    cervical cancer   Carpal tunnel syndrome    Bilateral   Chest pain 09/2011   Cardiac cath-normal coronaries   Constipation    Depression    Difficulty urinating 05/31/2013   Elevated LFTs 12/16/2013   GERD (gastroesophageal reflux disease)    History of kidney stones    Hyperlipemia    Hyperlipidemia    Hypertension    Mild; provoked by stress and anxiety   IBS (irritable bowel syndrome)    Intracerebral bleed ([place])    No aneurysm; followed by Dr. Sherwood Gambler   Loss of weight 01/06/2015   Osteoporosis    Stage 3a chronic kidney disease ([place]) 03/16/2021   Stroke ([place]) 1999   hemorrhagic stroke; weakness of left side    Past Surgical History:  Procedure Laterality Date   ABDOMINAL HYSTERECTOMY     "cancer cells"    BIOPSY  04/16/2021   Procedure: BIOPSY;  Surgeon: Montez Morita, Quillian Quince, MD;  Location: [facility];  Service: Gastroenterology;;  small, bowel, esophageal(proximal and distal);   [place]   to remove blood clot after stroke    CARDIAC CATHETERIZATION  2016   CERVICAL FUSION     CHOLECYSTECTOMY N/A 10/14/2014   Procedure: LAPAROSCOPIC CHOLECYSTECTOMY WITH INTRAOPERATIVE CHOLANGIOGRAM;  Surgeon: Jackolyn Confer, MD;  Location: [facility];  Service: General;  Laterality: N/A;   CHONDROPLASTY Right 07/13/2017   Procedure: CHONDROPLASTY of patella;  Surgeon: Carole Civil, MD;  Location: [facility];  Service: Orthopedics;  Laterality: Right;   ESOPHAGEAL DILATION N/A 04/16/2021   Procedure: ESOPHAGEAL DILATION;  Surgeon: Harvel Quale, MD;  [facility];  Service: Gastroenterology;  Laterality: N/A;   ESOPHAGOGASTRODUODENOSCOPY (EGD) WITH PROPOFOL N/A 04/16/2021   Procedure: ESOPHAGOGASTRODUODENOSCOPY (EGD) WITH PROPOFOL;  Surgeon: Harvel Quale, MD;  [facility];  Service: Gastroenterology;  Laterality: N/A;  1:35, pt knows to arrive at 9:45   EUS N/A 08/21/2015   Procedure: ESOPHAGEAL ENDOSCOPIC ULTRASOUND (EUS) RADIAL;  Surgeon: Carol Ada, MD;  [facility]: [facility];  Service: Endoscopy;  Laterality: N/A;   KNEE ARTHROSCOPY WITH MEDIAL MENISECTOMY Right 07/13/2017   Procedure: KNEE ARTHROSCOPY WITH PARTIAL MEDIAL MENISECTOMY;  Surgeon: Carole Civil, MD;  Location: [facility];  Service: Orthopedics;  Laterality: Right;   LEFT HEART CATHETERIZATION WITH CORONARY ANGIOGRAM N/A 09/23/2011   Procedure: LEFT HEART CATHETERIZATION WITH CORONARY ANGIOGRAM;  Surgeon: Thayer Headings, MD;  Location: [facility];  Service: Cardiovascular;  Laterality: N/A;   LIPOMA EXCISION Left 11/18/2013   Procedure: EXCISION OF SOFT TISSUE MASS-LEFT THIGH;  Surgeon: Jamesetta So, MD;  Location: [facility];  Service: General;  Laterality: Left;   POLYPECTOMY   04/16/2021   Procedure: POLYPECTOMY;  Surgeon: Harvel Quale, MD;  [facility];  Service: Gastroenterology;;  gastric   RECTOCELE REPAIR     x2   RECTOCELE REPAIR N/A 04/04/2017   Procedure: POSTERIOR REPAIR (RECTOCELE);  Surgeon: Jonnie Kind, MD;  Location: [facility];  Service: Gynecology;  Laterality: N/A;    Family History  Problem Relation Age of Onset   Cancer Mother        breast    Hypertension Mother    Hyperlipidemia Mother    Depression Mother    Anxiety disorder Mother    COPD Mother    Arthritis Mother        rheumatoid   Drug abuse Sister    Coronary artery disease Paternal Grandfather    Coronary artery disease Paternal Uncle    Depression Cousin    Drug abuse Cousin     Social History   Socioeconomic History   Marital status: Married    Spouse name: Sonia Side   Number of children: 0   Years of education: HS   Highest education level: Not on file  Occupational History   Occupation: unemployed    Comment: pending disability  Tobacco Use   Smoking status: Former    Packs/day: 1.00    Years: 19.00    Pack years: 19.00    Types: Cigarettes    Quit date: 09/01/1997    Years since quitting: 23.8   Smokeless tobacco: Never   Tobacco comments:    Quit smoking 1999 , previous 20 pack years  Vaping Use   Vaping Use: Never used  Substance and Sexual Activity   Alcohol use: Yes    Comment: 1 drink every other week   Drug use: No   Sexual activity: Not Currently    Partners: Male    Birth control/protection: Surgical    Comment: hyst   Other Topics Concern   Not on file  Social History Narrative   Currently unable to work   Lives in [place]   Married   Patient drinks 1 cup of caffeine daily.   Patient is right handed.    Joined the [employer] to get more exercise   Social Determinants of Health   Financial Resource Strain: Low Risk    Difficulty of Paying Living Expenses: Not hard at all  Food Insecurity: No Food Insecurity    Worried About Charity fundraiser in the Last Year: Never true   Arboriculturist in the Last Year: Never true  Transportation Needs: No Transportation Needs   Lack of Transportation (Medical): No   Lack of Transportation (Non-Medical): No  Physical Activity: Inactive   Days of Exercise per Week: 0 days   Minutes of Exercise per Session: 0 min  Stress: No Stress Concern Present   Feeling of Stress : Not at all  Social Connections: Moderately Isolated   Frequency of Communication with Friends and Family: More than three times a week   Frequency of Social Gatherings with Friends  and Family: More than three times a week   Attends Religious Services: Never   Active Member of Clubs or Organizations: No   Attends Archivist Meetings: Never   Marital Status: Married  Human resources officer Violence: Not At Risk   Fear of Current or Ex-Partner: No   Emotionally Abused: No   Physically Abused: No   Sexually Abused: No    Outpatient Medications Prior to Visit  Medication Sig Dispense Refill   acetaminophen (TYLENOL) 500 MG tablet Take 500-1,000 mg by mouth every 6 (six) hours as needed for moderate pain.     ALPRAZolam (XANAX) 1 MG tablet Take 1 tablet (1 mg total) by mouth 3 (three) times daily. 90 tablet 2   atorvastatin (LIPITOR) 40 MG tablet Take 1 tablet (40 mg total) by mouth daily. 90 tablet 3   Calcium 500-125 MG-UNIT TABS Take 1 tablet by mouth daily with supper.     Carboxymethylcellulose Sod PF 0.5 % SOLN Place 1 drop into both eyes daily as needed (dry eyes).     diclofenac (CATAFLAM) 50 MG tablet Take 1 tablet (50 mg total) by mouth 2 (two) times daily. 90 tablet 3   diclofenac Sodium (VOLTAREN) 1 % GEL Apply 1 application topically daily as needed (pain).     dicyclomine (BENTYL) 10 MG capsule TAKE ONE CAPSULE EVERY 12 HOURS AS NEEDED 180 capsule 3   ezetimibe (ZETIA) 10 MG tablet Take 1 tablet (10 mg total) by mouth daily. 90 tablet 3   famotidine (PEPCID) 20 MG tablet Take  20 mg by mouth daily as needed.     fexofenadine (ALLEGRA) 180 MG tablet Take 180 mg by mouth daily.     fluticasone (FLONASE) 50 MCG/ACT nasal spray Place 2 sprays into both nostrils daily.     lubiprostone (AMITIZA) 24 MCG capsule Take 1 capsule (24 mcg total) by mouth 2 (two) times daily with a meal. 180 capsule 1   methylphenidate (RITALIN) 20 MG tablet Take 1 tablet (20 mg total) by mouth 2 (two) times daily with breakfast and lunch. 60 tablet 0   montelukast (SINGULAIR) 10 MG tablet TAKE (1) TABLET BY MOUTH AT BEDTIME. 30 tablet 3   Multiple Vitamin (MULITIVITAMIN WITH MINERALS) TABS Take 1 tablet by mouth daily with breakfast.     nabumetone (RELAFEN) 500 MG tablet Take 500 mg by mouth every 8 (eight) hours as needed (joint pain).     nystatin (MYCOSTATIN/NYSTOP) powder Apply 1 application topically 3 (three) times daily. (Patient taking differently: Apply 1 application topically 3 (three) times daily as needed.) 15 g 2   Olopatadine HCl 0.2 % SOLN Place 1 drop into both eyes in the morning and at bedtime.     Omega-3 Fatty Acids (FISH OIL) 1000 MG CAPS Take 2,000 mg by mouth.     omeprazole (PRILOSEC) 40 MG capsule TAKE ONE CAPSULE BY MOUTH TWICE DAILY. 180 capsule 0   oxybutynin (DITROPAN-XL) 10 MG 24 hr tablet TAKE (1) TABLET BY MOUTH AT BEDTIME 90 tablet 0   Probiotic Product (TRUBIOTICS PO) Take 1 capsule by mouth daily. Takes 25 cfu daily.     pyridOXINE (VITAMIN B-6) 100 MG tablet Take 100 mg by mouth daily.     traZODone (DESYREL) 100 MG tablet Take 2 tablets (200 mg total) by mouth at bedtime. 60 tablet 2   venlafaxine XR (EFFEXOR XR) 150 MG 24 hr capsule Take 1 capsule (150 mg total) by mouth daily with breakfast. 90 capsule 2   COVID-19  mRNA bivalent vaccine, Pfizer, (PFIZER COVID-19 VAC BIVALENT) injection Inject into the muscle. 0.3 mL 0   lisinopril (ZESTRIL) 20 MG tablet Take 1 tablet (20 mg total) by mouth 2 (two) times daily. (Patient taking differently: Take 20 mg by mouth  daily.) 180 tablet 1   No facility-administered medications prior to visit.    Allergies  Allergen Reactions   Morphine And Related Hives   Promethazine Hcl Other (See Comments)    Causes patient to become Hyper    ROS Review of Systems  Constitutional:  Negative for chills and fever.  HENT:  Negative for congestion, sinus pressure, sinus pain and sore throat.   Eyes:  Negative for pain and discharge.  Respiratory:  Negative for cough and shortness of breath.   Cardiovascular:  Negative for chest pain and palpitations.  Gastrointestinal:  Positive for constipation. Negative for abdominal pain, diarrhea, nausea and vomiting.  Endocrine: Negative for polydipsia and polyuria.  Genitourinary:  Negative for dysuria and hematuria.  Musculoskeletal:  Negative for neck pain and neck stiffness.  Skin:  Negative for rash.  Allergic/Immunologic: Positive for environmental allergies.  Neurological:  Negative for dizziness and weakness.  Psychiatric/Behavioral:  Negative for agitation and behavioral problems.      Objective:    Physical Exam Vitals reviewed.  Constitutional:      General: Diamond L Butson "[name]" is not in acute distress.    Appearance: Diamond L Livers "[name]" is not diaphoretic.  HENT:     Head: Normocephalic and atraumatic.     Nose: Nose normal. No congestion.     Mouth/Throat:     Mouth: Mucous membranes are moist.     Pharynx: No posterior oropharyngeal erythema.  Eyes:     General: No scleral icterus.    Extraocular Movements: Extraocular movements intact.  Cardiovascular:     Rate and Rhythm: Normal rate and regular rhythm.     Pulses: Normal pulses.     Heart sounds: Normal heart sounds. No murmur heard. Pulmonary:     Breath sounds: Normal breath sounds. No wheezing or rales.  Abdominal:     Palpations: Abdomen is soft.     Tenderness: There is no abdominal tenderness.  Musculoskeletal:     Cervical back: Neck supple. No tenderness.     Right lower  leg: No edema.     Left lower leg: No edema.  Skin:    General: Skin is warm.     Findings: No rash.  Neurological:     General: No focal deficit present.     Mental Status: Diamond L Zaborowski "[name]" is alert and oriented to person, place, and time.     Sensory: No sensory deficit.     Motor: No weakness.     Comments: Facial droop, chronic  Psychiatric:        Mood and Affect: Mood normal.        Behavior: Behavior normal.    BP 112/64 (BP Location: Right Arm, Patient Position: Sitting, Cuff Size: Normal)    Pulse 79    Resp 18    Ht 5' 3"  (1.6 m)    Wt 135 lb 1.9 oz (61.3 kg)    SpO2 97%    BMI 23.94 kg/m  Wt Readings from Last 3 Encounters:  07/07/21 135 lb 1.9 oz (61.3 kg)  05/31/21 138 lb 0.3 oz (62.6 kg)  05/03/21 138 lb 12.8 oz (63 kg)    Lab Results  Component Value Date   TSH 2.430 05/12/2021  Lab Results  Component Value Date   WBC 10.9 (H) 04/14/2021   HGB 13.2 04/14/2021   HCT 38.4 04/14/2021   MCV 101.6 (H) 04/14/2021   PLT 313 04/14/2021   Lab Results  Component Value Date   NA 139 04/14/2021   K 4.1 04/14/2021   CO2 30 04/14/2021   GLUCOSE 112 (H) 04/14/2021   BUN 24 (H) 04/14/2021   CREATININE 1.16 (H) 04/14/2021   BILITOT 0.6 01/29/2021   ALKPHOS 69 01/29/2021   AST 27 01/29/2021   ALT 48 (H) 01/29/2021   PROT 7.0 01/29/2021   ALBUMIN 4.6 01/29/2021   CALCIUM 9.5 04/14/2021   ANIONGAP 8 04/14/2021   EGFR 54 (L) 01/29/2021   Lab Results  Component Value Date   CHOL 189 05/12/2021   Lab Results  Component Value Date   HDL 61 05/12/2021   Lab Results  Component Value Date   LDLCALC 98 05/12/2021   Lab Results  Component Value Date   TRIG 177 (H) 05/12/2021   Lab Results  Component Value Date   CHOLHDL 3.1 05/12/2021   Lab Results  Component Value Date   HGBA1C 5.3 06/02/2017      Assessment & Plan:   Problem List Items Addressed This Visit       Encounter for general adult medical examination with abnormal findings -  Primary   Physical exam as documented above. Fasting labs ordered. PCV20 in the office today.      Relevant Orders  CMP14+EGFR  CBC with Differential/Platelet    Cardiovascular and Mediastinum   Hypertension    BP Readings from Last 1 Encounters:  07/07/21 112/64  Well-controlled with lisinopril 20 mg QD Counseled for compliance with the medications Advised DASH diet and moderate exercise/walking, at least 150 mins/week       Relevant Medications   lisinopril (ZESTRIL) 20 MG tablet     Genitourinary   Stage 3a chronic kidney disease (HCC)    Last BMP showed GFR of 55 Previous BMPs showed GFR > 60. Avoid nephrotoxic agents including NSAIDs, had told her to avoid Nabumetone in the last visit        Other   Other Visit Diagnoses     Need for hepatitis C screening test       Relevant Orders   Hepatitis C Antibody   Vitamin D deficiency       Relevant Orders   VITAMIN D 25 Hydroxy (Vit-D Deficiency, Fractures)   Macrocytosis without anemia       Relevant Orders   B12   Prediabetes       Relevant Orders   Hemoglobin A1c   Need for pneumococcal vaccination       Relevant Orders   Pneumococcal conjugate vaccine 20-valent (Prevnar 20) (Completed)       Meds ordered this encounter  Medications   lisinopril (ZESTRIL) 20 MG tablet    Sig: Take 1 tablet (20 mg total) by mouth daily.    Dispense:  90 tablet    Refill:  1    Dose change    Follow-up: Return in about 4 months (around 11/04/2021) for HTN and CKD.    Lindell Spar, MD

## 2021-07-08 NOTE — Patient Instructions (Signed)
Chaneka L Amacher,  It was great to talk to you today!  Please call me with any questions or concerns.   Visit Information  Following are the goals we discussed today:  Patient Goals/Self-Care Activities Over the next 90 days, patient will:  Take medications as prescribed Check blood pressure at least once daily, document, and provide at future appointments Target a minimum of 150 minutes of moderate intensity exercise weekly  Plan: Face to Face appointment with care management team member scheduled for: 11/04/21  Kennon Holter, PharmD, BCACP, CPP Clinical Pharmacist Practitioner [facility] [phone]  Please call the care guide team at [phone] if you need to cancel or reschedule your appointment.   Patient verbalizes understanding of instructions provided today and agrees to view in [place].

## 2021-07-08 NOTE — Chronic Care Management (AMB) (Signed)
Chronic Care Management Pharmacy Note  07/08/2021 Name:  [name] MRN:  [id number] DOB:  09-29-1960  Summary: Hyperlipidemia - Goal on Track (progressing): YES.: Uncontrolled. LDL above goal of <70 due to very high risk given established clinical ASCVD (stroke in 1999 per patient) per 2020 AACE/ACE guidelines and TG above goal of <150 per 2020 AACE/ACE guidelines. Stroke from 1999 not definitive and unlikely to be ischemic. See MRI report from 1999: Continue atorvastatin 40 mg by mouth once daily and ezetimibe 10 mg by mouth once daily For elevated triglycerides, recommend over the counter fish oil (omega-3s) 1-2 capsules per day.  Repeat lipid panel in May before next PCP appointment  Subjective: [name] is an 61 y.o. year old female who is a primary patient of Lindell Spar, MD.  The CCM team was consulted for assistance with disease management and care coordination needs.    Engaged with patient face to face for follow up visit in response to provider referral for pharmacy case management and/or care coordination services.   Consent to Services:  The patient was given information about Chronic Care Management services, agreed to services, and gave verbal consent prior to initiation of services.  Please see initial visit note for detailed documentation.   Patient Care Team: Lindell Spar, MD as PCP - General (Internal Medicine) Beryle Lathe, [facility] (Pharmacist)  Objective:  Lab Results  Component Value Date   CREATININE 1.16 (H) 04/14/2021   CREATININE 1.17 (H) 01/29/2021   CREATININE 1.07 (H) 08/31/2020    Lab Results  Component Value Date   HGBA1C 5.3 06/02/2017   Last diabetic Eye exam: No results found for: HMDIABEYEEXA  Last diabetic Foot exam: No results found for: HMDIABFOOTEX      Component Value Date/Time   CHOL 189 05/12/2021 0812   TRIG 177 (H) 05/12/2021 0812   HDL 61 05/12/2021 0812   CHOLHDL 3.1 05/12/2021 0812   CHOLHDL 2.3  09/16/2015 0939   VLDL 26 09/16/2015 0939   LDLCALC 98 05/12/2021 0812    Hepatic Function Latest Ref Rng & Units 01/29/2021 08/31/2020 06/02/2017  Total Protein 6.0 - 8.5 g/dL 7.0 6.9 7.4  Albumin 3.8 - 4.9 g/dL 4.6 4.4 5.1  AST 0 - 40 IU/L 27 38 19  ALT 0 - 32 IU/L 48(H) 60(H) 17  Alk Phosphatase 44 - 121 IU/L 69 77 80  Total Bilirubin 0.0 - 1.2 mg/dL 0.6 0.5 0.9    Lab Results  Component Value Date/Time   TSH 2.430 05/12/2021 08:12 AM   TSH 1.770 08/31/2020 08:39 AM   FREET4 1.11 08/31/2020 08:39 AM    CBC Latest Ref Rng & Units 04/14/2021 08/31/2020 07/10/2017  WBC 4.0 - 10.5 K/uL 10.9(H) 5.7 7.5  Hemoglobin 12.0 - 15.0 g/dL 13.2 13.5 13.3  Hematocrit 36.0 - 46.0 % 38.4 39.9 39.9  Platelets 150 - 400 K/uL 313 289 304    Lab Results  Component Value Date/Time   VD25OH 41.6 08/31/2020 08:39 AM   VD25OH 36.8 06/02/2017 09:22 AM    Clinical ASCVD:  questionable The 10-year ASCVD risk score (Arnett DK, et al., 2019) is: 2.4%   Values used to calculate the score:     Age: 37 years     Sex: Female     Is Non-Hispanic African American: No     Diabetic: No     Tobacco smoker: No     Systolic Blood Pressure: 419 mmHg     Is BP  treated: Yes     HDL Cholesterol: 61 mg/dL     Total Cholesterol: 189 mg/dL    Social History   Tobacco Use  Smoking Status Former   Packs/day: 1.00   Years: 19.00   Pack years: 19.00   Types: Cigarettes   Quit date: 09/01/1997   Years since quitting: 23.8  Smokeless Tobacco Never  Tobacco Comments   Quit smoking 1999 , previous 20 pack years   BP Readings from Last 3 Encounters:  07/07/21 112/64  05/31/21 118/74  05/04/21 133/79   Pulse Readings from Last 3 Encounters:  07/07/21 79  05/31/21 76  04/16/21 71   Wt Readings from Last 3 Encounters:  07/07/21 135 lb 1.9 oz (61.3 kg)  05/31/21 138 lb 0.3 oz (62.6 kg)  05/03/21 138 lb 12.8 oz (63 kg)    Assessment: Review of patient past medical history, allergies, medications, health  status, including review of consultants reports, laboratory and other test data, was performed as part of comprehensive evaluation and provision of chronic care management services.   SDOH:  (Social Determinants of Health) assessments and interventions performed:    CCM Care Plan  Allergies  Allergen Reactions   Morphine And Related Hives   Promethazine Hcl Other (See Comments)    Causes patient to become Hyper    Medications Reviewed Today     Reviewed by Beryle Lathe, [facility] (Pharmacist) on 07/08/21 at [phone]  Med List Status: <None>   Medication Order Taking? Sig Documenting Provider Last Dose Status Informant  acetaminophen (TYLENOL) 500 MG tablet [id number] Yes Take 500-1,000 mg by mouth every 6 (six) hours as needed for moderate pain. [provider] Taking Active Self  ALPRAZolam Duanne Moron) 1 MG tablet [id number] Yes Take 1 tablet (1 mg total) by mouth 3 (three) times daily. Cloria Spring, MD Taking Active   atorvastatin (LIPITOR) 40 MG tablet [id number] Yes Take 1 tablet (40 mg total) by mouth daily. Lindell Spar, MD Taking Active            Med Note Beryle Lathe   Tue May 11, 2021 11:11 AM)    Calcium 500-125 MG-UNIT TABS [id number] Yes Take 1 tablet by mouth daily with supper. [provider] Taking Active Self  Carboxymethylcellulose Sod PF 0.5 % SOLN [id number] Yes Place 1 drop into both eyes daily as needed (dry eyes). [provider] Taking Active Self  diclofenac (CATAFLAM) 50 MG tablet [id number] Yes Take 1 tablet (50 mg total) by mouth 2 (two) times daily. Carole Civil, MD Taking Active Self  diclofenac Sodium (VOLTAREN) 1 % GEL [id number] Yes Apply 1 application topically daily as needed (pain). [provider] Taking Active Self  dicyclomine (BENTYL) 10 MG capsule [id number] Yes TAKE ONE CAPSULE EVERY 12 HOURS AS NEEDED Montez Morita, Daniel, MD Taking Active   ezetimibe (ZETIA) 10 MG tablet [id number] Yes Take 1  tablet (10 mg total) by mouth daily. Lindell Spar, MD Taking Active   famotidine (PEPCID) 20 MG tablet [id number] Yes Take 20 mg by mouth daily as needed. [provider] Taking Active Self           Med Note Beryle Lathe   Tue May 11, 2021 11:12 AM)    fexofenadine (ALLEGRA) 180 MG tablet [id number] Yes Take 180 mg by mouth daily. [provider] Taking Active Self  fluticasone (FLONASE) 50 MCG/ACT nasal spray [id number]  Place 2 sprays into both nostrils daily. [provider]  Active  Self  lisinopril (ZESTRIL) 20 MG tablet [id number] Yes Take 1 tablet (20 mg total) by mouth daily. Lindell Spar, MD Taking Active   lubiprostone [facility]) 24 MCG capsule [id number] Yes Take 1 capsule (24 mcg total) by mouth 2 (two) times daily with a meal. Montez Morita, Quillian Quince, MD Taking Active Self           Med Note Darylene Price May 03, 2021  2:42 PM)  EOD  methylphenidate (RITALIN) 20 MG tablet [id number] Yes Take 1 tablet (20 mg total) by mouth 2 (two) times daily with breakfast and lunch. Cloria Spring, MD Taking Active   montelukast (SINGULAIR) 10 MG tablet [id number] Yes TAKE (1) TABLET BY MOUTH AT BEDTIME. Lindell Spar, MD Taking Active   Multiple Vitamin [facility] WITH MINERALS) TABS [id number] Yes Take 1 tablet by mouth daily with breakfast. [provider] Taking Active Self  nabumetone (RELAFEN) 500 MG tablet [id number] Yes Take 500 mg by mouth every 8 (eight) hours as needed (joint pain). [provider] Taking Active Self  nystatin (MYCOSTATIN/NYSTOP) powder [id number] Yes Apply 1 application topically 3 (three) times daily.  Patient taking differently: Apply 1 application topically 3 (three) times daily as needed.   Lindell Spar, MD Taking Active   Olopatadine HCl 0.2 % SOLN [id number] Yes Place 1 drop into both eyes in the morning and at bedtime. [provider] Taking Active Self  Omega-3 Fatty Acids (FISH OIL)  1000 MG CAPS [id number] Yes Take 2,000 mg by mouth. [provider] Taking Active   omeprazole (PRILOSEC) 40 MG capsule [id number] Yes TAKE ONE CAPSULE BY MOUTH TWICE DAILY. Montez Morita, Quillian Quince, MD Taking Active   oxybutynin (DITROPAN-XL) 10 MG 24 hr tablet [id number] Yes TAKE (1) TABLET BY MOUTH AT BEDTIME Lindell Spar, MD Taking Active   Probiotic Product (TRUBIOTICS PO) [id number] Yes Take 1 capsule by mouth daily. Takes 25 cfu daily. [provider] Taking Active Self  pyridOXINE (VITAMIN B-6) 100 MG tablet [id number] Yes Take 100 mg by mouth daily. [provider] Taking Active Self  traZODone (DESYREL) 100 MG tablet [id number] Yes Take 2 tablets (200 mg total) by mouth at bedtime. Cloria Spring, MD Taking Active   venlafaxine XR (EFFEXOR XR) 150 MG 24 hr capsule [id number] Yes Take 1 capsule (150 mg total) by mouth daily with breakfast. 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   History of hemorrhagic stroke with residual hemiplegia ([place]) 06/15/2020   Urinary incontinence 06/15/2020   Asthma due to seasonal allergies 06/15/2020   Vaginal itching 03/16/2020   S/P right knee arthroscopy 07/13/17 07/20/2017   Derangement of posterior horn of medial meniscus of right knee    Chondromalacia, patella, right    IBS (irritable bowel syndrome) 05/11/2015   Gait instability 01/06/2015   DDD (degenerative disc disease), lumbar 02/18/2014   Postmenopausal atrophic vaginitis 11/01/2013   Lipoma of abdominal wall 08/14/2013   Back pain 05/29/2013   Paresthesia of foot 02/12/2013   Hypertension     GERD (gastroesophageal reflux disease)    Depression  11/17/2011   Insomnia 11/17/2011   Mixed hyperlipidemia 09/20/2011   Anxiety 09/20/2011    Immunization History  Administered Date(s) Administered   Influenza Split 08/13/2012, 05/11/2013   Influenza,inj,Quad PF,6+ Mos 05/11/2015, 03/24/2019, 05/10/2021   Influenza-Unspecified 05/10/2014, 04/17/2017, 03/16/2020   PFIZER(Purple Top)SARS-COV-2 Vaccination 09/26/2019, 10/19/2019, 08/12/2020   PNEUMOCOCCAL CONJUGATE-20 07/07/2021   Pfizer Covid-19 Vaccine Bivalent Booster 73yr & up 05/12/2021   Tdap 04/30/2008, 02/12/2013    Conditions to be addressed/monitored: HTN, HLD, CKD Stage 3a, Anxiety, Depression, and Asthma  Care Plan : Medication Management  Updates made by WBeryle Lathe [place]Clay[place]since 07/08/2021 12:00 AM     Problem: HTN, HLD, CKD, Asthma/Allergies, Anxiety/Depression   Priority: High  Onset Date: 03/11/2021     Long-Range Goal: Disease Progression Prevention   Start Date: 03/11/2021  Expected End Date: 06/09/2021  Recent Progress: On track  Priority: High  Note:   Current Barriers:  Unable to achieve control of hyperlipidemia  Pharmacist Clinical Goal(s):  Over the next 90 days, patient will Achieve control of hyperlipidemia as evidenced by improved LDL and improved triglycerides through collaboration with PharmD and provider.   Interventions: 1:1 collaboration with PLindell Spar MD regarding development and update of comprehensive plan of care as evidenced by provider attestation and co-signature Inter-disciplinary care team collaboration (see longitudinal plan of care) Comprehensive medication review performed; medication list updated in electronic medical record  Hypertension - Condition stable. Not addressed this visit.: Blood pressure under good control. Blood pressure is at goal of <130/80 mmHg per 2017 AHA/ACC guidelines. Current medications: lisinopril 20 mg by mouth once daily Patient stopped  taking amlodipine and has self-decreased lisinopril down to 242mdaily Intolerances: none Taking medications as directed: yes Side effects thought to be attributed to current medication regimen: no Current diet: modified FODMOP/DASH diet (follows with Ms CrHarriett Sineor nutrition) Current exercise: not discussed today Current home blood pressure: <130/80 mmHg Continue lisinopril 20 mg by mouth once daily Encourage dietary sodium restriction/DASH diet Recommend regular aerobic exercise Recommend home blood pressure monitoring to discuss at next visit Reviewed risks of hypertension, principles of treatment and consequences of untreated hypertension  Hyperlipidemia - Goal on Track (progressing): YES.: Uncontrolled. LDL above goal of <70 due to very high risk given established clinical ASCVD (stroke in 1999 per patient) per 2020 AACE/ACE guidelines and TG above goal of <150 per 2020 AACE/ACE guidelines. Stroke from 1999 not definitive and unlikely to be ischemic. See MRI report from 1999: STATUS POST CVA IN APRIL, 1999. UPON REVIEWING THE PATIENT'S PRIOR CT SCANS, IT APPEARS THERE WAS A RIGHT FRONTAL HEMATOMA WHICH WAS EVACUATED. A REPORT FROM ANGIOGRAM PERFORMED 4/99 DID NOT DETECT ANY ARTERIAL VENOUS MALFORMATION.  THERE IS AN AREA OF ENCEPHALOMALACIA IN THE RIGHT FRONTAL LOBE IN THE REGION WHERE THE PATIENT HAD RIGHT FRONTAL LOBE HEMATOMA WHICH WAS EVACUATED. IN THE CENTRAL PORTION OF THIS ENCEPHALOMALACIA, THERE IS MILD ENHANCEMENT.  THIS MAY SIMPLY REPRESENT POST-HEMORRHAGE-TYPE CHANGES. TO COMPLETELY EXCLUDE A SUBTLE VASCULAR MALFORMATION, AN ANGIOGRAM OR MAGNETIC RESONANCE ANGIOGRAM COULD BE OBTAINED FOR FURTHER DELINEATION. THERE IS NO EVIDENCE FOR HYDROCEPHALUS OR MIDLINE SHIFT. BY MR SCAN, I DO NOT DETECT EVIDENCE OF AN ACUTE HEMORRHAGE. THERE IS SURROUNDING GLIOSIS AT THE ENCEPHALOMALACIA SITE.  NONSPECIFIC PERIVENTRICULAR WHITE MATTER TYPE CHANGES ARE NOTED. A SMALL FOCAL AREA OF SUBCORTICAL  WHITE MATTER TYPE CHANGE LEFT ANTERIOR FRONTAL LOBE. THESE LATTER FINDINGS ARE NONSPECIFIC. CONSIDERATIONS WOULD INCLUDE CHANGES SECONDARY TO UNDERLYING SMALL VESSEL DISEASE, VASCULITIS, DEMYELINATING PROCESS, INFLAMMATORY PROCESS OR CHANGES AS  HAVE BEEN DESCRIBED IN PATIENT'S EXPERIENCE OF MIGRAINE HEADACHES. THERE IS NO EVIDENCE OF AN ACUTE INFARCT. Current medications: atorvastatin 40 mg by mouth once daily and ezetimibe 10 mg by mouth once daily Intolerances: none Taking medications as directed: yes Side effects thought to be attributed to current medication regimen: no Recommend regular aerobic exercise Reviewed risks of hyperlipidemia, principles of treatment and consequences of untreated hyperlipidemia Continue atorvastatin 40 mg by mouth once daily and ezetimibe 10 mg by mouth once daily For elevated triglycerides, recommend over the counter fish oil (omega-3s) 1-2 capsules per day.  Repeat lipid panel in May before next PCP appointment  Asthma/Allergies - Condition stable. Not addressed this visit.: Controlled.  Current treatment: montelukast (Singulair) 10 mg by mouth once daily in the evening, Allegra, and Flonase Patient denies the need for a rescue inhaler as she does not normally have shortness of breath unless she exerts herself more than normal Continue current medication management Recommend allergen avoidance (environmental control) Encouraged regular physical activity for general health benefits  Chronic Kidney Disease Stage 3a - GFR 45-59 (Mild to Moderately Reduced Function) - Condition stable. Not addressed this visit.: Appropriately managed Current medications: lisinopril 20 mg by mouth once daily and atorvastatin 40 mg by mouth once daily Intolerances: none Taking medications as directed: yes Side effects thought to be attributed to current medication regimen: no Most recent GFR: 55 mL/min Most recent microalbumin/creatinine ratio: within normal limits Recommend  adequate hydration  Recommend regular aerobic exercise Discussed need for improved control of cholesterol  Patient does not meet criteria for strong consideration of SGLT2 inhibitor therapy at this time  Anxiety/Depression - Condition stable. Not addressed this visit.: Controlled per patient Current medications: venlafaxine 150 mg by mouth daily, alprazolam 1 mg three time daily as needed for anxiety, and trazodone 200 mg by mouth at bedtime Patient reports that she follows with a psych provider Continue current medication management   Patient Goals/Self-Care Activities Over the next 90 days, patient will:  Take medications as prescribed Check blood pressure at least once daily, document, and provide at future appointments Target a minimum of 150 minutes of moderate intensity exercise weekly  Follow Up Plan: Face to Face appointment with care management team member scheduled for: 11/04/21      Medication Assistance: None required.  Patient affirms current coverage meets needs.  Patient's preferred pharmacy is:  [place], [place] [place] [address] Phone: [phone] Fax: [phone]  Follow Up:  Patient agrees to Care Plan and Follow-up.  Plan: Face to Face appointment with care management team member scheduled for: 11/04/21  Kennon Holter, PharmD, [place], CPP Clinical Pharmacist Practitioner [facility] [phone]

## 2021-07-12 ENCOUNTER — Encounter (INDEPENDENT_AMBULATORY_CARE_PROVIDER_SITE_OTHER): Payer: Self-pay | Admitting: Gastroenterology

## 2021-07-12 ENCOUNTER — Other Ambulatory Visit (INDEPENDENT_AMBULATORY_CARE_PROVIDER_SITE_OTHER): Payer: Self-pay | Admitting: Gastroenterology

## 2021-07-12 DIAGNOSIS — R140 Abdominal distension (gaseous): Secondary | ICD-10-CM

## 2021-07-12 DIAGNOSIS — K581 Irritable bowel syndrome with constipation: Secondary | ICD-10-CM

## 2021-07-12 DIAGNOSIS — K580 Irritable bowel syndrome with diarrhea: Secondary | ICD-10-CM

## 2021-07-12 MED ORDER — RIFAXIMIN 550 MG PO TABS
550.0000 mg | ORAL_TABLET | Freq: Three times a day (TID) | ORAL | 0 refills | Status: AC
Start: 2021-07-12 — End: 2021-07-26

## 2021-07-16 DIAGNOSIS — J343 Hypertrophy of nasal turbinates: Secondary | ICD-10-CM | POA: Diagnosis not present

## 2021-07-16 DIAGNOSIS — J310 Chronic rhinitis: Secondary | ICD-10-CM | POA: Diagnosis not present

## 2021-07-16 DIAGNOSIS — J342 Deviated nasal septum: Secondary | ICD-10-CM | POA: Diagnosis not present

## 2021-07-17 ENCOUNTER — Other Ambulatory Visit (HOSPITAL_COMMUNITY): Payer: Self-pay | Admitting: Psychiatry

## 2021-07-19 ENCOUNTER — Other Ambulatory Visit: Payer: Self-pay | Admitting: Orthopedic Surgery

## 2021-07-21 ENCOUNTER — Ambulatory Visit: Payer: PPO | Admitting: Obstetrics and Gynecology

## 2021-07-21 ENCOUNTER — Encounter: Payer: Self-pay | Admitting: Obstetrics and Gynecology

## 2021-07-21 ENCOUNTER — Other Ambulatory Visit: Payer: Self-pay

## 2021-07-21 VITALS — BP 102/68 | HR 95 | Ht 63.0 in | Wt 135.0 lb

## 2021-07-21 DIAGNOSIS — N3281 Overactive bladder: Secondary | ICD-10-CM | POA: Diagnosis not present

## 2021-07-21 DIAGNOSIS — N952 Postmenopausal atrophic vaginitis: Secondary | ICD-10-CM

## 2021-07-21 DIAGNOSIS — M62838 Other muscle spasm: Secondary | ICD-10-CM

## 2021-07-21 DIAGNOSIS — N941 Unspecified dyspareunia: Secondary | ICD-10-CM | POA: Diagnosis not present

## 2021-07-21 MED ORDER — ESTRADIOL 0.1 MG/GM VA CREA: 0.5000 g | TOPICAL_CREAM | VAGINAL | 11 refills | Status: DC

## 2021-07-21 MED ORDER — TOLTERODINE TARTRATE ER 4 MG PO CP24: 4.0000 mg | ORAL_CAPSULE | Freq: Every day | ORAL | 5 refills | Status: DC

## 2021-07-21 MED ORDER — CYCLOBENZAPRINE HCL 5 MG PO TABS: 5.0000 mg | ORAL_TABLET | Freq: Three times a day (TID) | ORAL | 1 refills | Status: DC | PRN

## 2021-07-21 NOTE — Patient Instructions (Addendum)
Start vaginal estrogen therapy nightly for two weeks then 2 times weekly at night for treatment of vaginal atrophy (dryness of the vaginal tissues).  Please let us know if the prescription is too expensive and we can look for alternative options.    Vulvovaginal moisturizer Options: Vitamin E oil (pump or capsule) or cream (Gene's Vit E Cream) Coconut oil Silicone-based lubricant for use during intercourse ("wet platinum" is a brand available at most drugstores) Crisco Consider the ingredients of the product - the fewer the ingredients the better!  Directions for Use: Clean and dry your hands Gently dab the vulvar/vaginal area dry as needed Apply a pea-sized amount of the moisturizer onto your fingertip Using you other hand, open the labia  Apply the moisturizer to the vulvar/vaginal tissues Wear loose fitting underwear/clothing if possible following application Use moisturize up to 3 times daily as desired.
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
## 2021-07-22 ENCOUNTER — Encounter: Payer: Self-pay | Admitting: Obstetrics and Gynecology

## 2021-07-22 DIAGNOSIS — N3941 Urge incontinence: Secondary | ICD-10-CM

## 2021-07-23 ENCOUNTER — Encounter: Payer: Self-pay | Admitting: Internal Medicine

## 2021-07-23 ENCOUNTER — Other Ambulatory Visit: Payer: Self-pay | Admitting: Otolaryngology

## 2021-07-25 ENCOUNTER — Encounter: Payer: Self-pay | Admitting: Internal Medicine

## 2021-07-25 ENCOUNTER — Encounter (INDEPENDENT_AMBULATORY_CARE_PROVIDER_SITE_OTHER): Payer: Self-pay | Admitting: Gastroenterology

## 2021-07-25 ENCOUNTER — Other Ambulatory Visit (INDEPENDENT_AMBULATORY_CARE_PROVIDER_SITE_OTHER): Payer: Self-pay | Admitting: Gastroenterology

## 2021-07-26 ENCOUNTER — Other Ambulatory Visit: Payer: Self-pay | Admitting: Internal Medicine

## 2021-07-26 ENCOUNTER — Encounter (HOSPITAL_COMMUNITY): Payer: Self-pay

## 2021-07-26 ENCOUNTER — Telehealth (HOSPITAL_COMMUNITY): Payer: PPO | Admitting: Psychiatry

## 2021-07-26 ENCOUNTER — Encounter (INDEPENDENT_AMBULATORY_CARE_PROVIDER_SITE_OTHER): Payer: Self-pay

## 2021-07-26 DIAGNOSIS — N3941 Urge incontinence: Secondary | ICD-10-CM

## 2021-07-26 MED ORDER — OXYBUTYNIN CHLORIDE ER 10 MG PO TB24: 10.0000 mg | ORAL_TABLET | Freq: Every day | ORAL | 3 refills | Status: DC

## 2021-07-26 MED ORDER — OXYBUTYNIN CHLORIDE ER 10 MG PO TB24: 10.0000 mg | ORAL_TABLET | Freq: Every day | ORAL | 0 refills | Status: DC

## 2021-07-26 NOTE — Telephone Encounter (Signed)
Please let her know she will need a follow up appointment for further refills.

## 2021-07-26 NOTE — Telephone Encounter (Signed)
Note sent in [place].

## 2021-07-27 ENCOUNTER — Telehealth (HOSPITAL_COMMUNITY): Payer: PPO | Admitting: Psychiatry

## 2021-07-27 ENCOUNTER — Other Ambulatory Visit: Payer: Self-pay

## 2021-07-27 ENCOUNTER — Encounter (HOSPITAL_COMMUNITY): Payer: Self-pay | Admitting: Psychiatry

## 2021-07-27 DIAGNOSIS — F332 Major depressive disorder, recurrent severe without psychotic features: Secondary | ICD-10-CM

## 2021-07-27 DIAGNOSIS — R413 Other amnesia: Secondary | ICD-10-CM | POA: Diagnosis not present

## 2021-07-27 MED ORDER — VENLAFAXINE HCL ER 150 MG PO CP24: 150.0000 mg | ORAL_CAPSULE | Freq: Every day | ORAL | 2 refills | Status: DC

## 2021-07-27 MED ORDER — TRAZODONE HCL 100 MG PO TABS: 200.0000 mg | ORAL_TABLET | Freq: Every day | ORAL | 2 refills | Status: DC

## 2021-07-27 MED ORDER — METHYLPHENIDATE HCL 20 MG PO TABS: 20.0000 mg | ORAL_TABLET | Freq: Two times a day (BID) | ORAL | 0 refills | Status: DC

## 2021-07-27 MED ORDER — ALPRAZOLAM 1 MG PO TABS: 1.0000 mg | ORAL_TABLET | Freq: Three times a day (TID) | ORAL | 2 refills | Status: DC

## 2021-07-27 NOTE — Progress Notes (Signed)
Virtual Visit via Telephone Note  I connected with [place] on 07/27/21 at 11:20 AM EST by telephone and verified that I am speaking with the correct person using two identifiers.  Location: Patient: home Provider: office   I discussed the limitations, risks, security and privacy concerns of performing an evaluation and management service by telephone and the availability of in person appointments. I also discussed with the patient that there may be a patient responsible charge related to this service. The patient expressed understanding and agreed to proceed.      I discussed the assessment and treatment plan with the patient. The patient was provided an opportunity to ask questions and all were answered. The patient agreed with the plan and demonstrated an understanding of the instructions.   The patient was advised to call back or seek an in-person evaluation if the symptoms worsen or if the condition fails to improve as anticipated.  I provided 15 minutes of non-face-to-face time during this encounter.   [name], MD  [facility] MD/PA/NP OP Progress Note  07/27/2021 11:46 AM [name]  MRN:  [id number]  Chief Complaint:  Chief Complaint   Depression; Anxiety; Follow-up    HPI: T his patient is a 61 year old married white female who lives with her husband in [place]. She has no children. 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   The patient was referred by her primary physician, Dr. [name], for further assessment and treatment of depression anxiety and focus problems.   The patient states that she did well most of her life until she had a stroke in 42 at age 36. She had a cerebral aneurysm that burst and she had to have a hematoma evacuated from the right frontal lobe. She has had resulting difficulties ever since and still has weakness on the left side of her body and poor fine motor skills in her left hand.  She is right handed. She states that she gets older her symptoms worsen.   The patient often feels anxious particular in crowds. She has frequent panic attacks. She takes Xanax 1 mg twice a day but is reluctant to take more. Shortly after she had her stroke she saw psychiatrist in [place] and was placed on the Xanax. Dr. [name] later put her on Effexor and she is now up to 150 mg per day. [name] not sure if it's helping. She has difficulty sleeping but trazodone helps to some degree. She also is significant problems with focus and attention span. Her thoughts ramble. She'll start one thing and go the next. She can't complete tasks. She finds this extremely frustrating. Her mood is labile at times and she'll get angry quickly and for no reason. She denies auditory or visual hallucinations or paranoia. She does not use drugs and very rarely takes a drink. She admits that time she has passive suicidal ideation but would never hurt her self because of her faith  Patient returns for follow-up after 3 months.  For the most part she is doing okay.  She still having a lot of chronic pain in her joints from arthritis.  She is also experiencing bladder leakage and is undergoing studies with a urologist.  She has gotten herself a new puppy which is taking up a lot of her time as well as new fish tanks.  Overall her mood has been good and she denies significant depression anxiety thoughts of self-harm or  suicidal ideation.  The methylphenidate is still helping her focus and alertness Visit Diagnosis:    ICD-10-CM   1. Major depressive disorder, recurrent, severe without psychotic features ([name])  F33.2     2. Memory deficit  R41.3       Past Psychiatric History: Past outpatient treatment for depression  Past Medical History:  Past Medical History:  Diagnosis Date   Allergy    grass, dust , mold   Anxiety    Arthritis    Asthma due to seasonal allergies 06/15/2020   Bipolar disorder ([place])    Carpal  tunnel syndrome    Bilateral   Chest pain 09/2011   Cardiac cath-normal coronaries   Constipation    Depression    Difficulty urinating 05/31/2013   Elevated LFTs 12/16/2013   GERD (gastroesophageal reflux disease)    History of kidney stones    Hyperlipemia    Hyperlipidemia    Hypertension    Mild; provoked by stress and anxiety   IBS (irritable bowel syndrome)    Intracerebral bleed ([place])    No aneurysm; followed by Dr. Sherwood Gambler   Loss of weight 01/06/2015   Osteoporosis    Stage 3a chronic kidney disease ([place]) 03/16/2021   Stroke ([place]) 1999   hemorrhagic stroke; weakness of left side    Past Surgical History:  Procedure Laterality Date   ABDOMINAL HYSTERECTOMY     "cancer cells"   BIOPSY  04/16/2021   Procedure: BIOPSY;  Surgeon: Montez Morita, Quillian Quince, MD;  [facility];  Service: Gastroenterology;;  small, bowel, esophageal(proximal and distal);   BRAIN SURGERY  1999   to remove blood clot after stroke    CARDIAC CATHETERIZATION  2016   CERVICAL FUSION     CHOLECYSTECTOMY N/A 10/14/2014   Procedure: LAPAROSCOPIC CHOLECYSTECTOMY WITH INTRAOPERATIVE CHOLANGIOGRAM;  Surgeon: Jackolyn Confer, MD;  Location: [facility];  Service: General;  Laterality: N/A;   CHONDROPLASTY Right 07/13/2017   Procedure: CHONDROPLASTY of patella;  Surgeon: Carole Civil, MD;  Location: [facility];  Service: Orthopedics;  Laterality: Right;   ESOPHAGEAL DILATION N/A 04/16/2021   Procedure: ESOPHAGEAL DILATION;  Surgeon: Harvel Quale, MD;  [facility];  Service: Gastroenterology;  Laterality: N/A;   ESOPHAGOGASTRODUODENOSCOPY (EGD) WITH PROPOFOL N/A 04/16/2021   Procedure: ESOPHAGOGASTRODUODENOSCOPY (EGD) WITH PROPOFOL;  Surgeon: Harvel Quale, MD;  [facility];  Service: Gastroenterology;  Laterality: N/A;  1:35, pt knows to arrive at 9:45   EUS N/A 08/21/2015   Procedure: ESOPHAGEAL ENDOSCOPIC ULTRASOUND (EUS) RADIAL;  Surgeon: Carol Ada, MD;  [facility];  Service: Endoscopy;  Laterality: N/A;   KNEE ARTHROSCOPY WITH MEDIAL MENISECTOMY Right 07/13/2017   Procedure: KNEE ARTHROSCOPY WITH PARTIAL MEDIAL MENISECTOMY;  Surgeon: Carole Civil, MD;  Location: [facility];  Service: Orthopedics;  Laterality: Right;   LEFT HEART CATHETERIZATION WITH CORONARY ANGIOGRAM N/A 09/23/2011   Procedure: LEFT HEART CATHETERIZATION WITH CORONARY ANGIOGRAM;  Surgeon: Thayer Headings, MD;  Location: [facility];  Service: Cardiovascular;  Laterality: N/A;   LIPOMA EXCISION Left 11/18/2013   Procedure: EXCISION OF SOFT TISSUE MASS-LEFT THIGH;  Surgeon: Jamesetta So, MD;  Location[facility];  Service: General;  Laterality: Left;   POLYPECTOMY  04/16/2021   Procedure: POLYPECTOMY;  Surgeon: Harvel Quale, MD;  [facility];  Service: Gastroenterology;;  gastric   RECTOCELE REPAIR     x2   RECTOCELE REPAIR N/A 04/04/2017   Procedure: POSTERIOR REPAIR (RECTOCELE);  Surgeon:  Jonnie Kind, MD;  Location: [facility];  Service: Gynecology;  Laterality: N/A;    Family Psychiatric History: see below  Family History:  Family History  Problem Relation Age of Onset   Cancer Mother        breast    Hypertension Mother    Hyperlipidemia Mother    Depression Mother    Anxiety disorder Mother    COPD Mother    Arthritis Mother        rheumatoid   Drug abuse Sister    Coronary artery disease Paternal Grandfather    Coronary artery disease Paternal Uncle    Depression Cousin    Drug abuse Cousin     Social History:  Social History   Socioeconomic History   Marital status: Married    Spouse name: Sonia Side   Number of children: 0   Years of education: HS   Highest education level: Not on file  Occupational History   Occupation: unemployed    Comment: pending disability  Tobacco Use   Smoking status: Former    Packs/day: 1.00    Years: 19.00    Pack years: 19.00    Types: Cigarettes    Quit date: 09/01/1997     Years since quitting: 23.9   Smokeless tobacco: Never   Tobacco comments:    Quit smoking 1999 , previous 20 pack years  Vaping Use   Vaping Use: Never used  Substance and Sexual Activity   Alcohol use: Yes    Comment: 1 drink every other week   Drug use: No   Sexual activity: Not Currently    Partners: Male    Birth control/protection: Surgical    Comment: hyst   Other Topics Concern   Not on file  Social History Narrative   Currently unable to work   Lives in [place]   Married   Patient drinks 1 cup of caffeine daily.   Patient is right handed.    Joined the [employer] to get more exercise   Social Determinants of Health   Financial Resource Strain: Low Risk    Difficulty of Paying Living Expenses: Not hard at all  Food Insecurity: No Food Insecurity   Worried About Charity fundraiser in the Last Year: Never true   Arboriculturist in the Last Year: Never true  Transportation Needs: No Transportation Needs   Lack of Transportation (Medical): No   Lack of Transportation (Non-Medical): No  Physical Activity: Inactive   Days of Exercise per Week: 0 days   Minutes of Exercise per Session: 0 min  Stress: No Stress Concern Present   Feeling of Stress : Not at all  Social Connections: Moderately Isolated   Frequency of Communication with Friends and Family: More than three times a week   Frequency of Social Gatherings with Friends and Family: More than three times a week   Attends Religious Services: Never   Marine scientist or Organizations: No   Attends Archivist Meetings: Never   Marital Status: Married    Allergies:  Allergies  Allergen Reactions   Morphine And Related Hives   Promethazine Hcl Other (See Comments)    Causes patient to become Hyper    Metabolic Disorder Labs: Lab Results  Component Value Date   HGBA1C 5.3 06/02/2017   MPG 117 (H) 01/06/2015   MPG 117 (H) 02/17/2014   No results found for: PROLACTIN Lab Results  Component  Value Date   CHOL 189  05/12/2021   TRIG 177 (H) 05/12/2021   HDL 61 05/12/2021   CHOLHDL 3.1 05/12/2021   VLDL 26 09/16/2015   LDLCALC 98 05/12/2021   LDLCALC 105 (H) 08/31/2020   Lab Results  Component Value Date   TSH 2.430 05/12/2021   TSH 1.770 08/31/2020    Therapeutic Level Labs: No results found for: LITHIUM No results found for: VALPROATE No components found for:  CBMZ  Current Medications: Current Outpatient Medications  Medication Sig Dispense Refill   methylphenidate (RITALIN) 20 MG tablet Take 1 tablet (20 mg total) by mouth 2 (two) times daily with breakfast and lunch. 60 tablet 0   methylphenidate (RITALIN) 20 MG tablet Take 1 tablet (20 mg total) by mouth 2 (two) times daily with breakfast and lunch. 60 tablet 0   acetaminophen (TYLENOL) 500 MG tablet Take 500-1,000 mg by mouth every 6 (six) hours as needed for moderate pain.     ALPRAZolam (XANAX) 1 MG tablet Take 1 tablet (1 mg total) by mouth 3 (three) times daily. 90 tablet 2   atorvastatin (LIPITOR) 40 MG tablet Take 1 tablet (40 mg total) by mouth daily. 90 tablet 3   Calcium 500-125 MG-UNIT TABS Take 1 tablet by mouth daily with supper.     Carboxymethylcellulose Sod PF 0.5 % SOLN Place 1 drop into both eyes daily as needed (dry eyes).     cyclobenzaprine (FLEXERIL) 5 MG tablet Take 1 tablet (5 mg total) by mouth 3 (three) times daily as needed for muscle spasms. 30 tablet 1   diclofenac (CATAFLAM) 50 MG tablet TAKE (1) TABLET BY MOUTH TWICE DAILY. 90 tablet 0   diclofenac Sodium (VOLTAREN) 1 % GEL Apply 1 application topically daily as needed (pain).     dicyclomine (BENTYL) 10 MG capsule TAKE ONE CAPSULE EVERY 12 HOURS AS NEEDED 180 capsule 3   estradiol (ESTRACE) 0.1 MG/GM vaginal cream Place 0.5 g vaginally 2 (two) times a week. Place 0.5g nightly for two weeks then twice a week after 30 g 11   ezetimibe (ZETIA) 10 MG tablet Take 1 tablet (10 mg total) by mouth daily. 90 tablet 3   famotidine (PEPCID) 20  MG tablet Take 20 mg by mouth daily as needed.     fexofenadine (ALLEGRA) 180 MG tablet Take 180 mg by mouth daily.     fluticasone (FLONASE) 50 MCG/ACT nasal spray Place 2 sprays into both nostrils daily.     lisinopril (ZESTRIL) 20 MG tablet Take 1 tablet (20 mg total) by mouth daily. 90 tablet 1   lubiprostone (AMITIZA) 24 MCG capsule Take 1 capsule (24 mcg total) by mouth 2 (two) times daily with a meal. 180 capsule 1   methylphenidate (RITALIN) 20 MG tablet Take 1 tablet (20 mg total) by mouth 2 (two) times daily with breakfast and lunch. 60 tablet 0   montelukast (SINGULAIR) 10 MG tablet TAKE (1) TABLET BY MOUTH AT BEDTIME. 30 tablet 3   Multiple Vitamin (MULITIVITAMIN WITH MINERALS) TABS Take 1 tablet by mouth daily with breakfast.     nystatin (MYCOSTATIN/NYSTOP) powder Apply 1 application topically 3 (three) times daily. (Patient taking differently: Apply 1 application topically 3 (three) times daily as needed.) 15 g 2   Olopatadine HCl 0.2 % SOLN Place 1 drop into both eyes in the morning and at bedtime.     Omega-3 Fatty Acids (FISH OIL) 1000 MG CAPS Take 2,000 mg by mouth.     omeprazole (PRILOSEC) 40 MG capsule TAKE ONE CAPSULE BY  MOUTH TWICE DAILY. 180 capsule 0   oxybutynin (DITROPAN-XL) 10 MG 24 hr tablet Take 1 tablet (10 mg total) by mouth at bedtime. 90 tablet 3   Probiotic Product (TRUBIOTICS PO) Take 1 capsule by mouth daily. Takes 25 cfu daily.     pyridOXINE (VITAMIN B-6) 100 MG tablet Take 100 mg by mouth daily.     traZODone (DESYREL) 100 MG tablet Take 2 tablets (200 mg total) by mouth at bedtime. 60 tablet 2   venlafaxine XR (EFFEXOR XR) 150 MG 24 hr capsule Take 1 capsule (150 mg total) by mouth daily with breakfast. 90 capsule 2   No current facility-administered medications for this visit.     Musculoskeletal: Strength & Muscle Tone: na Gait & Station: na Patient leans: N/A  Psychiatric Specialty Exam: Review of Systems  Genitourinary:  Positive for urgency.   Musculoskeletal:  Positive for arthralgias and myalgias.  All other systems reviewed and are negative.  There were no vitals taken for this visit.There is no height or weight on file to calculate BMI.  General Appearance: NA  Eye Contact:  NA  Speech:  Clear and Coherent  Volume:  Normal  Mood:  Euthymic  Affect:  NA  Thought Process:  Goal Directed  Orientation:  Full (Time, Place, and Person)  Thought Content: Rumination   Suicidal Thoughts:  No  Homicidal Thoughts:  No  Memory:  Immediate;   Good Recent;   Good Remote;   Good  Judgement:  Good  Insight:  Fair  Psychomotor Activity:  Decreased  Concentration:  Concentration: Good and Attention Span: Good  Recall:  [place] of Knowledge: Good  Language: Good  Akathisia:  No  Handed:  Right  AIMS (if indicated): not done  Assets:  Communication Skills Desire for Improvement Resilience Social Support Talents/Skills  ADL's:  Intact  Cognition: WNL  Sleep:  Good   Screenings: AUDIT    Flowsheet Row Clinical Support from 01/06/2021 in [facility]  Alcohol Use Disorder Identification Test Final Score (AUDIT) 4      MDI    Flowsheet Row Office Visit from 01/22/2016 in [facility]  Total Score (max 50) 34      Mini-Mental    Flowsheet Row Office Visit from 02/13/2015 in [facility]  Total Score (max 30 points ) 26      PHQ2-9    Flowsheet Row Video Visit from 07/27/2021 in [facility] Office Visit from 07/07/2021 in [facility] Office Visit from 05/04/2021 in [facility] Nutrition from 05/03/2021 in [facility] Video Visit from 04/26/2021 in [facility]  PHQ-2 Total Score 0 0 0 1 0  PHQ-9 Total Score -- -- 0 -- --      SBQ-R    Flowsheet Row Office Visit from 01/22/2016 in [facility]  SBQ-R Total Score 14.1      Flowsheet Row Video Visit from 07/27/2021 in [facility] Admission (Discharged) from 04/16/2021 in [facility] from 04/14/2021 in [facility] No Risk No Risk No Risk        Assessment and Plan: This patient is a 61 year old female with a history depression anxiety and problems with focus and energy.  She is doing well on her current regimen.  She will continue methylphenidate 20 mg twice daily for focus and alertness,  trazodone 200 mg at bedtime for sleep, Effexor XR 150 mg daily for depression and Xanax 1 mg up to 3 times daily for anxiety.  She will return to see me in 3 months   [name], MD 07/27/2021, 11:46 AM

## 2021-07-28 ENCOUNTER — Encounter: Payer: Self-pay | Admitting: Obstetrics and Gynecology

## 2021-07-29 NOTE — Telephone Encounter (Signed)
I spoke to the patient and she need clarification on when to use the coconut oil and if the dilators should be used with the estrogen cream. I told the patient I will call her back later today.

## 2021-08-02 ENCOUNTER — Ambulatory Visit (INDEPENDENT_AMBULATORY_CARE_PROVIDER_SITE_OTHER): Payer: PPO | Admitting: Gastroenterology

## 2021-08-03 DIAGNOSIS — I10 Essential (primary) hypertension: Secondary | ICD-10-CM

## 2021-08-03 DIAGNOSIS — E782 Mixed hyperlipidemia: Secondary | ICD-10-CM

## 2021-08-03 DIAGNOSIS — F32A Depression, unspecified: Secondary | ICD-10-CM | POA: Diagnosis not present

## 2021-08-03 DIAGNOSIS — J45909 Unspecified asthma, uncomplicated: Secondary | ICD-10-CM | POA: Diagnosis not present

## 2021-08-03 DIAGNOSIS — N1831 Chronic kidney disease, stage 3a: Secondary | ICD-10-CM

## 2021-08-05 ENCOUNTER — Encounter (INDEPENDENT_AMBULATORY_CARE_PROVIDER_SITE_OTHER): Payer: Self-pay | Admitting: Gastroenterology

## 2021-08-09 ENCOUNTER — Encounter: Payer: Self-pay | Admitting: Obstetrics and Gynecology

## 2021-08-10 ENCOUNTER — Encounter: Payer: Self-pay | Admitting: Orthopedic Surgery

## 2021-08-10 ENCOUNTER — Encounter (INDEPENDENT_AMBULATORY_CARE_PROVIDER_SITE_OTHER): Payer: Self-pay | Admitting: Gastroenterology

## 2021-08-11 ENCOUNTER — Encounter: Payer: Self-pay | Admitting: Internal Medicine

## 2021-08-12 ENCOUNTER — Encounter (INDEPENDENT_AMBULATORY_CARE_PROVIDER_SITE_OTHER): Payer: Self-pay

## 2021-08-12 ENCOUNTER — Other Ambulatory Visit (INDEPENDENT_AMBULATORY_CARE_PROVIDER_SITE_OTHER): Payer: Self-pay | Admitting: Gastroenterology

## 2021-08-12 DIAGNOSIS — R140 Abdominal distension (gaseous): Secondary | ICD-10-CM

## 2021-08-12 DIAGNOSIS — K581 Irritable bowel syndrome with constipation: Secondary | ICD-10-CM

## 2021-08-12 NOTE — Telephone Encounter (Signed)
Note sent to the patient via My Chart to see if she took this already and if so course complete.

## 2021-08-19 ENCOUNTER — Encounter (INDEPENDENT_AMBULATORY_CARE_PROVIDER_SITE_OTHER): Payer: Self-pay | Admitting: Gastroenterology

## 2021-08-20 ENCOUNTER — Ambulatory Visit (HOSPITAL_COMMUNITY)
Admission: RE | Admit: 2021-08-20 | Discharge: 2021-08-20 | Disposition: A | Discharge status: Home / Self Care | Payer: PPO | Source: Ambulatory Visit | Attending: Gastroenterology | Admitting: Gastroenterology

## 2021-08-20 ENCOUNTER — Encounter (INDEPENDENT_AMBULATORY_CARE_PROVIDER_SITE_OTHER): Payer: Self-pay | Admitting: Gastroenterology

## 2021-08-20 ENCOUNTER — Other Ambulatory Visit (INDEPENDENT_AMBULATORY_CARE_PROVIDER_SITE_OTHER): Payer: Self-pay | Admitting: Gastroenterology

## 2021-08-20 ENCOUNTER — Other Ambulatory Visit: Payer: Self-pay

## 2021-08-20 DIAGNOSIS — G8929 Other chronic pain: Secondary | ICD-10-CM | POA: Diagnosis not present

## 2021-08-20 DIAGNOSIS — R109 Unspecified abdominal pain: Secondary | ICD-10-CM | POA: Diagnosis not present

## 2021-08-20 DIAGNOSIS — K449 Diaphragmatic hernia without obstruction or gangrene: Secondary | ICD-10-CM | POA: Diagnosis not present

## 2021-08-20 DIAGNOSIS — K581 Irritable bowel syndrome with constipation: Secondary | ICD-10-CM

## 2021-08-20 LAB — POCT I-STAT CREATININE: Creatinine, Ser: 1.4 mg/dL — ABNORMAL HIGH (ref 0.44–1.00)

## 2021-08-20 MED ORDER — IOHEXOL 350 MG/ML SOLN
100.0000 mL | Freq: Once | INTRAVENOUS | Status: AC | PRN
  Administered 2021-08-20: 85 mL via INTRAVENOUS

## 2021-08-20 MED ORDER — PEG 3350-KCL-NA BICARB-NACL 420 G PO SOLR: 4000.0000 mL | Freq: Once | ORAL | 0 refills | Status: AC

## 2021-08-23 ENCOUNTER — Other Ambulatory Visit: Payer: Self-pay

## 2021-08-23 ENCOUNTER — Ambulatory Visit: Payer: PPO | Admitting: Orthopedic Surgery

## 2021-08-23 ENCOUNTER — Encounter (HOSPITAL_BASED_OUTPATIENT_CLINIC_OR_DEPARTMENT_OTHER): Payer: Self-pay | Admitting: Otolaryngology

## 2021-08-23 DIAGNOSIS — G8929 Other chronic pain: Secondary | ICD-10-CM | POA: Diagnosis not present

## 2021-08-23 DIAGNOSIS — M25562 Pain in left knee: Secondary | ICD-10-CM

## 2021-08-23 DIAGNOSIS — M7051 Other bursitis of knee, right knee: Secondary | ICD-10-CM | POA: Diagnosis not present

## 2021-08-23 NOTE — Progress Notes (Signed)
Chief Complaint  Patient presents with   Knee Pain    Bilateral/ requests injections   Encounter Diagnoses  Name Primary?   Pes anserinus bursitis of right knee Yes   Chronic pain of left knee    REQUESTED INJECTIONS   Procedure note left knee injection   verbal consent was obtained to inject left knee joint  Timeout was completed to confirm the site of injection  The medications used were depomedrol 40 mg and 1% lidocaine 3 cc Anesthesia was provided by ethyl chloride and the skin was prepped with alcohol.  After cleaning the skin with alcohol a 20-gauge needle was used to inject the left knee joint. There were no complications. A sterile bandage was applied.    Procedure note right knee injection for bursitis   verbal consent was obtained to inject right knee PES BURSA  Timeout was completed to confirm the site of injection  The medications used were 40 mg of Depo-Medrol and 1% lidocaine 3 cc  Anesthesia was provided by ethyl chloride and the skin was prepped with alcohol.  After cleaning the skin with alcohol a 25-gauge needle was used to inject the right knee bursa.  There were no complications and a sterile bandage was applied

## 2021-08-24 ENCOUNTER — Encounter: Payer: Self-pay | Admitting: Internal Medicine

## 2021-08-25 ENCOUNTER — Encounter (HOSPITAL_BASED_OUTPATIENT_CLINIC_OR_DEPARTMENT_OTHER)
Admission: RE | Admit: 2021-08-25 | Discharge: 2021-08-25 | Disposition: A | Discharge status: Home / Self Care | Payer: PPO | Source: Ambulatory Visit | Attending: Otolaryngology | Admitting: Otolaryngology

## 2021-08-25 DIAGNOSIS — Z01812 Encounter for preprocedural laboratory examination: Secondary | ICD-10-CM | POA: Insufficient documentation

## 2021-08-25 LAB — BASIC METABOLIC PANEL
Anion gap: 8 (ref 5–15)
BUN: 22 mg/dL — ABNORMAL HIGH (ref 6–20)
CO2: 29 mmol/L (ref 22–32)
Calcium: 9.3 mg/dL (ref 8.9–10.3)
Chloride: 102 mmol/L (ref 98–111)
Creatinine, Ser: 1.1 mg/dL — ABNORMAL HIGH (ref 0.44–1.00)
GFR, Estimated: 58 mL/min — ABNORMAL LOW (ref >60)
Glucose, Bld: 106 mg/dL — ABNORMAL HIGH (ref 70–99)
Potassium: 4.1 mmol/L (ref 3.5–5.1)
Sodium: 139 mmol/L (ref 135–145)

## 2021-08-28 ENCOUNTER — Encounter (INDEPENDENT_AMBULATORY_CARE_PROVIDER_SITE_OTHER): Payer: Self-pay | Admitting: Gastroenterology

## 2021-08-30 ENCOUNTER — Ambulatory Visit (HOSPITAL_BASED_OUTPATIENT_CLINIC_OR_DEPARTMENT_OTHER): Payer: PPO | Admitting: Certified Registered"

## 2021-08-30 ENCOUNTER — Other Ambulatory Visit: Payer: Self-pay

## 2021-08-30 ENCOUNTER — Encounter (HOSPITAL_BASED_OUTPATIENT_CLINIC_OR_DEPARTMENT_OTHER)
Admission: RE | Disposition: A | Discharge status: Home / Self Care | Payer: Self-pay | Source: Home / Self Care | Attending: Otolaryngology

## 2021-08-30 ENCOUNTER — Ambulatory Visit (HOSPITAL_BASED_OUTPATIENT_CLINIC_OR_DEPARTMENT_OTHER)
Admission: RE | Admit: 2021-08-30 | Discharge: 2021-08-30 | Disposition: A | Discharge status: Home / Self Care | Payer: PPO | Attending: Otolaryngology | Admitting: Otolaryngology

## 2021-08-30 ENCOUNTER — Encounter (HOSPITAL_BASED_OUTPATIENT_CLINIC_OR_DEPARTMENT_OTHER): Payer: Self-pay | Admitting: Otolaryngology

## 2021-08-30 DIAGNOSIS — J342 Deviated nasal septum: Secondary | ICD-10-CM

## 2021-08-30 DIAGNOSIS — Z87891 Personal history of nicotine dependence: Secondary | ICD-10-CM | POA: Diagnosis not present

## 2021-08-30 DIAGNOSIS — I10 Essential (primary) hypertension: Secondary | ICD-10-CM | POA: Diagnosis not present

## 2021-08-30 DIAGNOSIS — J3489 Other specified disorders of nose and nasal sinuses: Secondary | ICD-10-CM | POA: Diagnosis not present

## 2021-08-30 DIAGNOSIS — J3089 Other allergic rhinitis: Secondary | ICD-10-CM | POA: Insufficient documentation

## 2021-08-30 DIAGNOSIS — J301 Allergic rhinitis due to pollen: Secondary | ICD-10-CM | POA: Diagnosis not present

## 2021-08-30 DIAGNOSIS — N1831 Chronic kidney disease, stage 3a: Secondary | ICD-10-CM

## 2021-08-30 DIAGNOSIS — J343 Hypertrophy of nasal turbinates: Secondary | ICD-10-CM | POA: Diagnosis not present

## 2021-08-30 DIAGNOSIS — I129 Hypertensive chronic kidney disease with stage 1 through stage 4 chronic kidney disease, or unspecified chronic kidney disease: Secondary | ICD-10-CM | POA: Diagnosis not present

## 2021-08-30 DIAGNOSIS — I69354 Hemiplegia and hemiparesis following cerebral infarction affecting left non-dominant side: Secondary | ICD-10-CM | POA: Insufficient documentation

## 2021-08-30 DIAGNOSIS — Z79899 Other long term (current) drug therapy: Secondary | ICD-10-CM | POA: Insufficient documentation

## 2021-08-30 DIAGNOSIS — N189 Chronic kidney disease, unspecified: Secondary | ICD-10-CM | POA: Diagnosis not present

## 2021-08-30 DIAGNOSIS — F418 Other specified anxiety disorders: Secondary | ICD-10-CM

## 2021-08-30 DIAGNOSIS — K219 Gastro-esophageal reflux disease without esophagitis: Secondary | ICD-10-CM | POA: Diagnosis not present

## 2021-08-30 DIAGNOSIS — I69392 Facial weakness following cerebral infarction: Secondary | ICD-10-CM | POA: Diagnosis not present

## 2021-08-30 HISTORY — DX: Presence of intraocular lens: Z96.1

## 2021-08-30 HISTORY — PX: NASAL SEPTOPLASTY W/ TURBINOPLASTY: SHX2070

## 2021-08-30 SURGERY — SEPTOPLASTY, NOSE, WITH NASAL TURBINATE REDUCTION
Anesthesia: General | Site: Nose | Laterality: Bilateral

## 2021-08-30 MED ORDER — ACETAMINOPHEN 500 MG PO TABS: 1000.0000 mg | ORAL_TABLET | Freq: Once | ORAL | Status: DC | PRN

## 2021-08-30 MED ORDER — PROPOFOL 10 MG/ML IV BOLUS
INTRAVENOUS | Status: DC | PRN
  Administered 2021-08-30: 110 mg via INTRAVENOUS

## 2021-08-30 MED ORDER — OXYMETAZOLINE HCL 0.05 % NA SOLN
NASAL | Status: DC | PRN
  Administered 2021-08-30: 1 via TOPICAL

## 2021-08-30 MED ORDER — MUPIROCIN 2 % EX OINT
TOPICAL_OINTMENT | CUTANEOUS | Status: AC
  Filled 2021-08-30: qty 44

## 2021-08-30 MED ORDER — LIDOCAINE 2% (20 MG/ML) 5 ML SYRINGE
INTRAMUSCULAR | Status: AC
  Filled 2021-08-30: qty 5

## 2021-08-30 MED ORDER — SUGAMMADEX SODIUM 200 MG/2ML IV SOLN
INTRAVENOUS | Status: DC | PRN
  Administered 2021-08-30: 200 mg via INTRAVENOUS

## 2021-08-30 MED ORDER — MUPIROCIN 2 % EX OINT
TOPICAL_OINTMENT | CUTANEOUS | Status: DC | PRN
  Administered 2021-08-30: 1 via TOPICAL

## 2021-08-30 MED ORDER — FENTANYL CITRATE (PF) 100 MCG/2ML IJ SOLN
INTRAMUSCULAR | Status: DC | PRN
  Administered 2021-08-30 (×2): 50 ug via INTRAVENOUS

## 2021-08-30 MED ORDER — PROPOFOL 10 MG/ML IV BOLUS
INTRAVENOUS | Status: AC
  Filled 2021-08-30: qty 20

## 2021-08-30 MED ORDER — ACETAMINOPHEN 10 MG/ML IV SOLN: 1000.0000 mg | Freq: Once | INTRAVENOUS | Status: DC | PRN

## 2021-08-30 MED ORDER — LACTATED RINGERS IV SOLN: INTRAVENOUS | Status: DC

## 2021-08-30 MED ORDER — ACETAMINOPHEN 160 MG/5ML PO SOLN: 1000.0000 mg | Freq: Once | ORAL | Status: DC | PRN

## 2021-08-30 MED ORDER — DEXAMETHASONE SODIUM PHOSPHATE 10 MG/ML IJ SOLN
INTRAMUSCULAR | Status: AC
Start: 2021-08-30 — End: ?
  Filled 2021-08-30: qty 1

## 2021-08-30 MED ORDER — ONDANSETRON HCL 4 MG/2ML IJ SOLN
INTRAMUSCULAR | Status: DC | PRN
Start: 2021-08-30 — End: 2021-08-30
  Administered 2021-08-30: 4 mg via INTRAVENOUS

## 2021-08-30 MED ORDER — ONDANSETRON HCL 4 MG/2ML IJ SOLN
INTRAMUSCULAR | Status: AC
  Filled 2021-08-30: qty 2

## 2021-08-30 MED ORDER — OXYCODONE HCL 5 MG PO TABS
ORAL_TABLET | ORAL | Status: AC
  Filled 2021-08-30: qty 1

## 2021-08-30 MED ORDER — PHENYLEPHRINE HCL (PRESSORS) 10 MG/ML IV SOLN
INTRAVENOUS | Status: DC | PRN
  Administered 2021-08-30 (×3): 40 ug via INTRAVENOUS

## 2021-08-30 MED ORDER — DEXAMETHASONE SODIUM PHOSPHATE 4 MG/ML IJ SOLN
INTRAMUSCULAR | Status: DC | PRN
Start: 2021-08-30 — End: 2021-08-30
  Administered 2021-08-30: 10 mg via INTRAVENOUS

## 2021-08-30 MED ORDER — FENTANYL CITRATE (PF) 100 MCG/2ML IJ SOLN
INTRAMUSCULAR | Status: AC
  Filled 2021-08-30: qty 2

## 2021-08-30 MED ORDER — AMOXICILLIN 875 MG PO TABS
875.0000 mg | ORAL_TABLET | Freq: Two times a day (BID) | ORAL | 0 refills | Status: AC
Start: 2021-08-30 — End: 2021-09-02

## 2021-08-30 MED ORDER — LIDOCAINE HCL (CARDIAC) PF 100 MG/5ML IV SOSY
PREFILLED_SYRINGE | INTRAVENOUS | Status: DC | PRN
  Administered 2021-08-30: 50 mg via INTRAVENOUS

## 2021-08-30 MED ORDER — LIDOCAINE-EPINEPHRINE 1 %-1:100000 IJ SOLN
INTRAMUSCULAR | Status: AC
  Filled 2021-08-30: qty 2

## 2021-08-30 MED ORDER — ACETAMINOPHEN 500 MG PO TABS: 1000.0000 mg | ORAL_TABLET | Freq: Once | ORAL | Status: DC

## 2021-08-30 MED ORDER — LIDOCAINE-EPINEPHRINE 1 %-1:100000 IJ SOLN
INTRAMUSCULAR | Status: DC | PRN
Start: 2021-08-30 — End: 2021-08-30
  Administered 2021-08-30: 5 mL

## 2021-08-30 MED ORDER — OXYCODONE-ACETAMINOPHEN 5-325 MG PO TABS: 1.0000 | ORAL_TABLET | ORAL | 0 refills | Status: AC | PRN

## 2021-08-30 MED ORDER — FENTANYL CITRATE (PF) 100 MCG/2ML IJ SOLN
25.0000 ug | INTRAMUSCULAR | Status: DC | PRN
  Administered 2021-08-30: 50 ug via INTRAVENOUS

## 2021-08-30 MED ORDER — AMISULPRIDE (ANTIEMETIC) 5 MG/2ML IV SOLN: 10.0000 mg | Freq: Once | INTRAVENOUS | Status: DC | PRN

## 2021-08-30 MED ORDER — OXYMETAZOLINE HCL 0.05 % NA SOLN
NASAL | Status: AC
  Filled 2021-08-30: qty 60

## 2021-08-30 MED ORDER — OXYCODONE HCL 5 MG/5ML PO SOLN: 5.0000 mg | Freq: Once | ORAL | Status: AC | PRN

## 2021-08-30 MED ORDER — MIDAZOLAM HCL 2 MG/2ML IJ SOLN
INTRAMUSCULAR | Status: AC
  Filled 2021-08-30: qty 2

## 2021-08-30 MED ORDER — ROCURONIUM BROMIDE 100 MG/10ML IV SOLN
INTRAVENOUS | Status: DC | PRN
  Administered 2021-08-30: 50 mg via INTRAVENOUS

## 2021-08-30 MED ORDER — OXYCODONE HCL 5 MG PO TABS
5.0000 mg | ORAL_TABLET | Freq: Once | ORAL | Status: AC | PRN
  Administered 2021-08-30: 5 mg via ORAL

## 2021-08-30 MED ORDER — PROPOFOL 10 MG/ML IV BOLUS
INTRAVENOUS | Status: AC
Start: 2021-08-30 — End: ?
  Filled 2021-08-30: qty 20

## 2021-08-30 SURGICAL SUPPLY — 31 items
ATTRACTOMAT 16X20 MAGNETIC DRP (DRAPES) IMPLANT
CANISTER SUCT 1200ML W/VALVE (MISCELLANEOUS) ×3 IMPLANT
COAGULATOR SUCT 8FR VV (MISCELLANEOUS) ×3 IMPLANT
DEFOGGER MIRROR 1QT (MISCELLANEOUS) ×3 IMPLANT
DRSG NASOPORE 8CM (GAUZE/BANDAGES/DRESSINGS) IMPLANT
DRSG TELFA 3X8 NADH (GAUZE/BANDAGES/DRESSINGS) IMPLANT
ELECT REM PT RETURN 9FT ADLT (ELECTROSURGICAL) ×2
ELECTRODE REM PT RTRN 9FT ADLT (ELECTROSURGICAL) ×2 IMPLANT
GLOVE SURG ENC MOIS LTX SZ7.5 (GLOVE) ×3 IMPLANT
GOWN STRL REUS W/ TWL LRG LVL3 (GOWN DISPOSABLE) ×4 IMPLANT
GOWN STRL REUS W/TWL LRG LVL3 (GOWN DISPOSABLE) ×4
NDL HYPO 25X1 1.5 SAFETY (NEEDLE) ×2 IMPLANT
NEEDLE HYPO 25X1 1.5 SAFETY (NEEDLE) ×2 IMPLANT
NS IRRIG 1000ML POUR BTL (IV SOLUTION) ×3 IMPLANT
PACK BASIN DAY SURGERY FS (CUSTOM PROCEDURE TRAY) ×3 IMPLANT
PACK ENT DAY SURGERY (CUSTOM PROCEDURE TRAY) ×3 IMPLANT
PAD DRESSING TELFA 3X8 NADH (GAUZE/BANDAGES/DRESSINGS) IMPLANT
SLEEVE SCD COMPRESS KNEE MED (STOCKING) ×1 IMPLANT
SPIKE FLUID TRANSFER (MISCELLANEOUS) IMPLANT
SPLINT NASAL AIRWAY SILICONE (MISCELLANEOUS) ×3 IMPLANT
SPONGE GAUZE 2X2 8PLY STRL LF (GAUZE/BANDAGES/DRESSINGS) ×3 IMPLANT
SPONGE NEURO XRAY DETECT 1X3 (DISPOSABLE) ×3 IMPLANT
SUT CHROMIC 4 0 P 3 18 (SUTURE) ×3 IMPLANT
SUT PLAIN 4 0 ~~LOC~~ 1 (SUTURE) ×3 IMPLANT
SUT PROLENE 3 0 PS 2 (SUTURE) ×3 IMPLANT
SUT VIC AB 4-0 P-3 18XBRD (SUTURE) IMPLANT
SUT VIC AB 4-0 P3 18 (SUTURE)
TOWEL GREEN STERILE FF (TOWEL DISPOSABLE) ×3 IMPLANT
TUBE SALEM SUMP 12R W/ARV (TUBING) IMPLANT
TUBE SALEM SUMP 16 FR W/ARV (TUBING) ×3 IMPLANT
YANKAUER SUCT BULB TIP NO VENT (SUCTIONS) ×3 IMPLANT

## 2021-08-30 NOTE — H&P (Signed)
Cc: Chronic nasal obstruction  HPI: The patient is a 61 year old female who presents today complaining of chronic nasal obstruction for 10+ years.  Her nasal obstruction is worse on the left side.  Her symptoms are usually worse at night.  She denies any recent sinus infection.  She has a history of environmental allergies.  She was previously found to be allergic to ragweed, dust, and molds.  She is currently on Allegra and Flonase.  The patient has been using Flonase nasal spray for 20+ years.  She has no previous history of ENT surgery.  The patient has a history of nasal trauma.  She did not seek any medical treatment for her nasal fractures.  Currently the patient denies any facial pain, fever, or visual change.   The patient's review of systems (constitutional, eyes, ENT, cardiovascular, respiratory, GI, musculoskeletal, skin, neurologic, psychiatric, endocrine, hematologic, allergic) is noted in the ROS questionnaire.  It is reviewed with the patient.  Major events: Cervical fusion, gallbladder removed, stroke, lens transplant.  Ongoing medical problems: Night sweats, hypertension, chest pain, stroke. asthma, chronic bronchitis, reflux, fatty liver, nausea, depression, arthritis, osteoarthritis.  Family health history: No HTN, DM, CAD, hearing loss or bleeding disorder.  Social history: The patient is married. She is disabled. She is a former smoker. She drinks one glass of wine at night. She denies the use of illegal drugs.    Exam: General: Communicates without difficulty, well nourished, no acute distress. Head: Normocephalic, no evidence injury, no tenderness, facial buttresses intact without stepoff. Face/sinus: No tenderness to palpation and percussion. Facial movement is normal and symmetric. Eyes: PERRL, EOMI. No scleral icterus, conjunctivae clear. Neuro: CN II exam reveals vision grossly intact.  No nystagmus at any point of gaze. Ears: Auricles well formed without lesions.  Ear canals  are intact without mass or lesion.  No erythema or edema is appreciated.  The TMs are intact without fluid. Nose: External evaluation reveals normal support and skin without lesions.  Dorsum is intact.  Anterior rhinoscopy reveals congested mucosa over anterior aspect of inferior turbinates and deviated septum.  No purulence noted. Oral:  Oral cavity and oropharynx are intact, symmetric, without erythema or edema.  Mucosa is moist without lesions. Neck: Full range of motion without pain.  There is no significant lymphadenopathy.  No masses palpable.  Thyroid bed within normal limits to palpation.  Parotid glands and submandibular glands equal bilaterally without mass.  Trachea is midline. Neuro:  CN 2-12 grossly intact. Gait normal. A flexible scope was inserted into the right nasal cavity.  Endoscopy of the interior nasal cavity, superior, inferior, and middle meatus was performed. The sphenoid-ethmoid recess was examined. Edematous mucosa was noted.  No polyp, mass, or lesion was appreciated. Severe nasal septal deviation noted. Olfactory cleft was clear.  Nasopharynx was clear.  Turbinates were hypertrophied but without mass.  The procedure was repeated on the contralateral side with similar findings.  The patient tolerated the procedure well.   Assessment  1.  Chronic rhinitis with nasal mucosal congestion, severe nasal septal deviation to the left, and bilateral inferior turbinate hypertrophy.  More than 95% of her nasal passageways are obstructed bilaterally.  2.  No polyps, mass, or lesions are noted on todays nasal endoscopy exam.  No acute infection is noted today.   Plan  1.  The physical exam and nasal endoscopy findings are reviewed with the patient.  2.  The patient should continue with the allergy treatment regimen of Allegra and Flonase.  3.  Nasal saline irrigation.  4.  In light of her chronic symptoms, she may benefit from undergoing surgical intervention with septoplasty and bilateral  turbinate reduction. The risks, benefits, alternatives and details of the procedures are extensively reviewed with the patient.  Questions are invited and answered.  5.  The patient would like to proceed with the procedures.

## 2021-08-30 NOTE — Op Note (Signed)
DATE OF PROCEDURE: 08/30/2021  OPERATIVE REPORT   SURGEON: Leta Baptist, MD   PREOPERATIVE DIAGNOSES:  1. Severe nasal septal deviation.  2. Bilateral inferior turbinate hypertrophy.  3. Chronic nasal obstruction.  POSTOPERATIVE DIAGNOSES:  1. Severe nasal septal deviation.  2. Bilateral inferior turbinate hypertrophy.  3. Chronic nasal obstruction.  PROCEDURE PERFORMED:  1. Septoplasty.  2. Bilateral partial inferior turbinate resection.   ANESTHESIA: General endotracheal tube anesthesia.   COMPLICATIONS: None.   ESTIMATED BLOOD LOSS: 100 mL.   INDICATION FOR PROCEDURE: [name] is a 61 y.o. female with a history of chronic nasal obstruction. The patient was treated with antihistamine, decongestant, and steroid nasal sprays. However, the patient continued to be symptomatic. On examination, the patient was noted to have bilateral severe inferior turbinate hypertrophy and significant nasal septal deviation, causing significant nasal obstruction. Based on the above findings, the decision was made for the patient to undergo the above-stated procedures. The risks, benefits, alternatives, and details of the procedures were discussed with the patient. Questions were invited and answered. Informed consent was obtained.   DESCRIPTION OF PROCEDURE: The patient was taken to the operating room and placed supine on the operating table. General endotracheal tube anesthesia was administered by the anesthesiologist. The patient was positioned, and prepped and draped in the standard fashion for nasal surgery. Pledgets soaked with Afrin were placed in both nasal cavities for decongestion. The pledgets were subsequently removed.   Examination of the nasal cavity revealed a severe nasal septal deviation. 1% lidocaine with 1:100,000 epinephrine was injected onto the nasal septum bilaterally. A hemitransfixion incision was made on the left side. The mucosal flap was carefully elevated on the left side. A  cartilaginous incision was made 1 cm superior to the caudal margin of the nasal septum. Mucosal flap was also elevated on the right side in the similar fashion. It should be noted that due to the severe septal deviation, the deviated portion of the cartilaginous and bony septum had to be removed in piecemeal fashion. Once the deviated portions were removed, a straight midline septum was achieved. The septum was then quilted with 4-0 plain gut sutures. The hemitransfixion incision was closed with interrupted 4-0 chromic sutures.   The inferior one half of both hypertrophied inferior turbinate was crossclamped with a Kelly clamp. The inferior one half of each inferior turbinate was then resected with a pair of cross cutting scissors. Hemostasis was achieved with a suction cautery device.  Doyle splints were applied to the nasal septum.  The care of the patient was turned over to the anesthesiologist. The patient was awakened from anesthesia without difficulty. The patient was extubated and transferred to the recovery room in good condition.   OPERATIVE FINDINGS: Severe nasal septal deviation and bilateral inferior turbinate hypertrophy.   SPECIMEN: None.   FOLLOWUP CARE: The patient be discharged home once she is awake and alert. The patient will be placed on Percocet p.r.n. pain, and amoxicillin 875 mg p.o. b.i.d. for 3 days. The patient will follow up in my office in 3 days for splint removal.   Quyen Cutsforth Raynelle Bring, MD

## 2021-08-30 NOTE — Discharge Instructions (Addendum)
POSTOPERATIVE INSTRUCTIONS FOR PATIENTS HAVING NASAL OR SINUS OPERATIONS °ACTIVITY: Restrict activity at home for the first two days, resting as much as possible. Light activity is best. You may usually return to work within a week. You should refrain from nose blowing, strenuous activity, or heavy lifting greater than 20lbs for a total of one week after your operation.  If sneezing cannot be avoided, sneeze with your mouth open. °DISCOMFORT: You may experience a dull headache and pressure along with nasal congestion and discharge. These symptoms may be worse during the first week after the operation but may last as long as two to four weeks.  Please take Tylenol or the pain medication that has been prescribed for you. Do not take aspirin or aspirin containing medications since they may cause bleeding.  You may experience symptoms of post nasal drainage, nasal congestion, headaches and fatigue for two or three months after your operation.  °BLEEDING: You may have some blood tinged nasal drainage for approximately two weeks after the operation.  The discharge will be worse for the first week.  Please call our office at [phone] or go to the nearest hospital emergency room if you experience any of the following: heavy, bright red blood from your nose or mouth that lasts longer than 15 minutes or coughing up or vomiting bright red blood or blood clots. °GENERAL CONSIDERATIONS: °A gauze dressing will be placed on your upper lip to absorb any drainage after the operation. You may need to change this several times a day.  If you do not have very much drainage, you may remove the dressing.  Remember that you may gently wipe your nose with a tissue and sniff in, but DO NOT blow your nose. °Please keep all of your postoperative appointments.  Your final results after the operation will depend on proper follow-up.  The initial visit is usually 2 to 5 days after the operation.  During this visit, the remaining nasal  packing and internal septal splints will be removed.  Your nasal and sinus cavities will be cleaned.  During the second visit, your nasal and sinus cavities will be cleaned again. Have someone drive you to your first two postoperative appointments.  °How you care for your nose after the operation will influence the results that you obtain.  You should follow all directions, take your medication as prescribed, and call our office [phone] with any problems or questions. °You may be more comfortable sleeping with your head elevated on two pillows. °Do not take any medications that we have not prescribed or recommended. °WARNING SIGNS: if any of the following should occur, please call our office: °Persistent fever greater than 102F. °Persistent vomiting. °Severe and constant pain that is not relieved by prescribed pain medication. °Trauma to the nose. °Rash or unusual side effects from any medicines. ° ° ° °Post Anesthesia Home Care Instructions ° °Activity: °Get plenty of rest for the remainder of the day. A responsible individual must stay with you for 24 hours following the procedure.  °For the next 24 hours, DO NOT: °-Drive a car °-Operate machinery °-Drink alcoholic beverages °-Take any medication unless instructed by your physician °-Make any legal decisions or sign important papers. ° °Meals: °Start with liquid foods such as gelatin or soup. Progress to regular foods as tolerated. Avoid greasy, spicy, heavy foods. If nausea and/or vomiting occur, drink only clear liquids until the nausea and/or vomiting subsides. Call your physician if vomiting continues. ° °Special Instructions/Symptoms: °Your throat may feel dry   or sore from the anesthesia or the breathing tube placed in your throat during surgery. If this causes discomfort, gargle with warm salt water. The discomfort should disappear within 24 hours. ° °If you had a scopolamine patch placed behind your ear for the management of post- operative nausea  and/or vomiting: ° °1. The medication in the patch is effective for 72 hours, after which it should be removed.  Wrap patch in a tissue and discard in the trash. Wash hands thoroughly with soap and water. °2. You may remove the patch earlier than 72 hours if you experience unpleasant side effects which may include dry mouth, dizziness or visual disturbances. °3. Avoid touching the patch. Wash your hands with soap and water after contact with the patch. °    °

## 2021-08-30 NOTE — Anesthesia Postprocedure Evaluation (Signed)
Anesthesia Post Note  Patient: [name]  Procedure(s) Performed: NASAL SEPTOPLASTY WITH BILATERAL TURBINATE REDUCTION (Bilateral: Nose)     Patient location during evaluation: PACU Anesthesia Type: General Level of consciousness: awake Pain management: pain level controlled Vital Signs Assessment: post-procedure vital signs reviewed and stable Respiratory status: spontaneous breathing and respiratory function stable Cardiovascular status: stable Postop Assessment: no apparent nausea or vomiting Anesthetic complications: no   No notable events documented.  Last Vitals:  Vitals:   08/30/21 0930 08/30/21 1002  BP: 130/73 131/68  Pulse: 85 89  Resp: 15 15  Temp:  (!) 36.2 C  SpO2: 92% 97%    Last Pain:  Vitals:   08/30/21 0947  TempSrc:   PainSc: 4                  Candra R Awesome Jared

## 2021-08-30 NOTE — Anesthesia Preprocedure Evaluation (Signed)
Anesthesia Evaluation  Patient identified by MRN, date of birth, ID band Patient awake    Reviewed: Allergy & Precautions, NPO status , Patient's Chart, lab work & pertinent test results  History of Anesthesia Complications Negative for: history of anesthetic complications  Airway Mallampati: III  TM Distance: >3 FB Neck ROM: Full  Mouth opening: Limited Mouth Opening  Dental  (+) Teeth Intact, Dental Advisory Given   Pulmonary neg shortness of breath, asthma , neg sleep apnea, neg COPD, neg recent URI, former smoker,    breath sounds clear to auscultation       Cardiovascular hypertension, Pt. on medications (-) angina(-) Past MI and (-) CHF  Rhythm:Regular     Neuro/Psych PSYCHIATRIC DISORDERS Anxiety Depression Crani in 1999 for stroke, residual left sided weakness, left facial droop with tongue deviated to right on exam  Neuromuscular disease CVA, Residual Symptoms    GI/Hepatic GERD  Medicated and Controlled,(+) Hepatitis -  Endo/Other  negative endocrine ROS  Renal/GU CRFRenal diseaseLab Results      Component                Value               Date                      CREATININE               1.10 (H)            08/25/2021                Musculoskeletal  (+) Arthritis ,   Abdominal   Peds  Hematology negative hematology ROS (+)   Anesthesia Other Findings   Reproductive/Obstetrics                             Anesthesia Physical Anesthesia Plan  ASA: 3  Anesthesia Plan: General   Post-op Pain Management: Tylenol PO (pre-op)*   Induction: Intravenous  PONV Risk Score and Plan: 3 and Ondansetron and Dexamethasone  Airway Management Planned: Oral ETT  Additional Equipment: None  Intra-op Plan:   Post-operative Plan: Extubation in OR  Informed Consent: I have reviewed the patients History and Physical, chart, labs and discussed the procedure including the risks, benefits  and alternatives for the proposed anesthesia with the patient or authorized representative who has indicated his/her understanding and acceptance.     Dental advisory given  Plan Discussed with: CRNA and Anesthesiologist  Anesthesia Plan Comments:         Anesthesia Quick Evaluation

## 2021-08-30 NOTE — Anesthesia Procedure Notes (Signed)
Procedure Name: Intubation Date/Time: 08/30/2021 7:51 AM Performed by: Verita Lamb, CRNA Pre-anesthesia Checklist: Patient identified, Emergency Drugs available, Suction available and Patient being monitored Patient Re-evaluated:Patient Re-evaluated prior to induction Oxygen Delivery Method: Circle system utilized Preoxygenation: Pre-oxygenation with 100% oxygen Induction Type: IV induction Ventilation: Mask ventilation without difficulty Laryngoscope Size: Glidescope and 3 Grade View: Grade I Tube type: Oral Tube size: 7.0 mm Number of attempts: 1 Airway Equipment and Method: Stylet, Oral airway and Rigid stylet Placement Confirmation: ETT inserted through vocal cords under direct vision, positive ETCO2, breath sounds checked- equal and bilateral and CO2 detector Secured at: 22 cm Tube secured with: Tape Dental Injury: Teeth and Oropharynx as per pre-operative assessment  Difficulty Due To: Difficulty was anticipated, Difficult Airway- due to anterior larynx and Difficult Airway- due to limited oral opening Comments: Pt has a high arch palate and has a receding chin, small oral opening.  Planned for difficult air way with glidescope available and ready.  Miller 2 blade could only see epiglottis (easy mask without oral airway).  Glidescope used and achieved a grade I view with a mac 3 blade.  Easily and atraumatically intubated with glidescope. Bbs.  Ointment placed on lips because they are very dry and chapped.  Secured tube to lower lip/midline per surgeon request

## 2021-08-30 NOTE — Transfer of Care (Signed)
Immediate Anesthesia Transfer of Care Note  Patient: [name]  Procedure(s) Performed: NASAL SEPTOPLASTY WITH BILATERAL TURBINATE REDUCTION (Bilateral: Nose)  Patient Location: PACU  Anesthesia Type:General  Level of Consciousness: awake, alert  and oriented  Airway & Oxygen Therapy: Patient Spontanous Breathing and Patient connected to face mask oxygen  Post-op Assessment: Report given to RN and Post -op Vital signs reviewed and stable  Post vital signs: Reviewed and stable  Last Vitals:  Vitals Value Taken Time  BP 130/71 08/30/21 0843  Temp    Pulse 99 08/30/21 0844  Resp 13 08/30/21 0844  SpO2 90 % 08/30/21 0844  Vitals shown include unvalidated device data.  Last Pain:  Vitals:   08/30/21 0649  TempSrc: Oral  PainSc: 0-No pain         Complications: No notable events documented.

## 2021-08-31 ENCOUNTER — Encounter (HOSPITAL_BASED_OUTPATIENT_CLINIC_OR_DEPARTMENT_OTHER): Payer: Self-pay | Admitting: Otolaryngology

## 2021-09-03 ENCOUNTER — Ambulatory Visit: Payer: Self-pay | Admitting: Obstetrics and Gynecology

## 2021-09-16 ENCOUNTER — Telehealth (HOSPITAL_COMMUNITY): Payer: Self-pay | Admitting: *Deleted

## 2021-09-16 ENCOUNTER — Encounter (HOSPITAL_COMMUNITY): Payer: Self-pay

## 2021-09-16 ENCOUNTER — Other Ambulatory Visit (HOSPITAL_COMMUNITY): Payer: Self-pay | Admitting: Psychiatry

## 2021-09-16 MED ORDER — LISDEXAMFETAMINE DIMESYLATE 40 MG PO CAPS: 40.0000 mg | ORAL_CAPSULE | ORAL | 0 refills | Status: DC

## 2021-09-16 NOTE — Telephone Encounter (Signed)
Opened in Error.

## 2021-09-19 ENCOUNTER — Other Ambulatory Visit: Payer: Self-pay | Admitting: Obstetrics and Gynecology

## 2021-09-19 ENCOUNTER — Encounter: Payer: Self-pay | Admitting: Obstetrics and Gynecology

## 2021-09-19 ENCOUNTER — Encounter (HOSPITAL_COMMUNITY): Payer: Self-pay

## 2021-09-19 ENCOUNTER — Other Ambulatory Visit: Payer: Self-pay | Admitting: Internal Medicine

## 2021-09-19 DIAGNOSIS — J45909 Unspecified asthma, uncomplicated: Secondary | ICD-10-CM

## 2021-09-19 DIAGNOSIS — M62838 Other muscle spasm: Secondary | ICD-10-CM

## 2021-09-21 ENCOUNTER — Ambulatory Visit (INDEPENDENT_AMBULATORY_CARE_PROVIDER_SITE_OTHER): Payer: PPO | Admitting: Gastroenterology

## 2021-09-21 ENCOUNTER — Encounter (INDEPENDENT_AMBULATORY_CARE_PROVIDER_SITE_OTHER): Payer: Self-pay | Admitting: Gastroenterology

## 2021-09-21 ENCOUNTER — Other Ambulatory Visit: Payer: Self-pay

## 2021-09-21 VITALS — BP 143/81 | HR 91 | Temp 97.6°F | Ht 63.0 in | Wt 140.1 lb

## 2021-09-21 DIAGNOSIS — G8929 Other chronic pain: Secondary | ICD-10-CM | POA: Diagnosis not present

## 2021-09-21 DIAGNOSIS — R109 Unspecified abdominal pain: Secondary | ICD-10-CM

## 2021-09-21 DIAGNOSIS — K219 Gastro-esophageal reflux disease without esophagitis: Secondary | ICD-10-CM

## 2021-09-21 DIAGNOSIS — K581 Irritable bowel syndrome with constipation: Secondary | ICD-10-CM | POA: Diagnosis not present

## 2021-09-21 NOTE — Patient Instructions (Signed)
Please continue with amitiza once daily and 1 capful of miralax daily with good result, if you are noticing harder stools I would add in another dose of miralax, if this does not provide results, you can take second dose of amitiza.  ?You can try phazyme or beano for your gas, though this is likely related to certain foods you are eating ?Please try keeping a food journal with the low FODMAP diet, trying to eliminate a few FODMAPs at a time, if you do not notice any change in symptoms, you can slowly add them back in. ?Please continue to avoid foods that tend to cause more issues ?I am hesitant to increase your bentyl to three times per day as this could cause worsening of constipation ?Please continue to stay well hydrated  ? ?Follow up 1 year ?

## 2021-09-21 NOTE — Progress Notes (Signed)
? ?Referring Provider: Lindell Spar, MD ?Primary Care Physician:  Lindell Spar, MD ?Primary GI Physician: Jenetta Downer ? ?Chief Complaint  ?Patient presents with  ? Follow-up  ?  Patient here today for a follow up visit, to follow up on constipation.   ? ?HPI:   ?[name] is a 61 y.o. female with past medical history of bipolar disorder, cervical cancer, depression, hyperlipidemia, hypertension, IBS, stroke, CKD stage III and NASH ? ?Patient presenting today for follow up of constipation. ? ?Last seen November 2022. Noted improvement in symtoms after changing diet and trying low FODMAP and DASH diet. Having infrequent RUQ pain. Recommended she continue with diet, continue miralax daily, amitiza 93mg every other day, bentyl 156mPRN for abdominal pain. Repeat EGD in 2 years.  ? ?She states that she was told she can take amitiza 2433mBID and Miralax TID. She states if she does this she will have multiple watery BMs per day that can last all day long. She is currently taking amitiza 86m37mnce daily and Miralax 1 capful once daily. Sometimes she has to take an extra dose of amitza. Typically is having 1 BM per day on this regimen. She was having some pain in her Right side, previously that has resolved. Does have some lower abdominal pain at times.  She is trying to drink a lot of water. She tries to to pay attention to what she eats. She is not following any certain diet at this time.  ? ?She is experiencing a lot of gas, taking gas x but not having much relief from this. Unsure if it is worse with certain foods. She has kept a food journal in the past and seen a dietician. She is unable to follow the low FODMAP diet strictly. She is using bentyl BID with good results, inquires if she can take this TID. ? ?Tells me she is planning to join a gym soon so that she can start exercising more. ? ?No red flag symptoms. Patient denies melena, hematochezia, nausea, vomiting, diarrhea, dysphagia, odyonophagia,  early satiety or weight loss.  ? ?Last EGD: 04/16/2021, no alterations were found in the esophagus, this was empirically dilated with a Savary dilator up to 18 mm.  Biopsies from esophagus were within normal limits.  There were a few erosions in the stomach.  Biopsies were negative for H. pylori.  A 12 mm polyp was snared from the gastric fundus which showed fundic gland polyp with low-grade dysplasia.  Duodenum was normal with biopsies negative for any adenopathies. ? Last Colonoscopy: 08/2018 -performed by Dr. PatrCarol Adaer report within normal limits, had biopsies of the right and left colon which were within normal limits. ?Last EUS 2017 - The pancreatic parenchyma was normal as well as the PD caliber. The PD was very small and difficult to measure. The CBD ?was normal in size (4.3 mm) and there was no evidence of any stones. No gallbladder was visualized, which is expected in her post ?cholecystectomy state. No other abnormalities were identified. ? ?Past Medical History:  ?Diagnosis Date  ? Allergy   ? grass, dust , mold  ? Anxiety   ? Arthritis   ? Asthma due to seasonal allergies 06/15/2020  ? Bipolar disorder (HCC)Ada? Carpal tunnel syndrome   ? Bilateral  ? Chest pain 09/2011  ? Cardiac cath-normal coronaries  ? Constipation   ? Depression   ? Difficulty urinating 05/31/2013  ? Elevated LFTs 12/16/2013  ? GERD (  gastroesophageal reflux disease)   ? History of kidney stones   ? Hyperlipemia   ? Hyperlipidemia   ? Hypertension   ? Mild; provoked by stress and anxiety  ? IBS (irritable bowel syndrome)   ? Intracerebral bleed ([place])   ? No aneurysm; followed by Dr. Sherwood Gambler  ? Lens replaced   ? "lense transplant" 2022; pt states lense don't dilate or constrict  ? Loss of weight 01/06/2015  ? Osteoporosis   ? Stage 3a chronic kidney disease ([place]) 03/16/2021  ? Stroke [facility]) 1999  ? hemorrhagic stroke; weakness of left side  ? ? ?Past Surgical History:  ?Procedure Laterality Date  ? ABDOMINAL HYSTERECTOMY    ?  "cancer cells"  ? BIOPSY  04/16/2021  ? Procedure: BIOPSY;  Surgeon: Montez Morita, Quillian Quince, MD;  [facility]: [facility];  Service: Gastroenterology;;  small, bowel, esophageal(proximal and distal);  ? [place]  ? to remove blood clot after stroke   ? CARDIAC CATHETERIZATION  2016  ? CERVICAL FUSION    ? CHOLECYSTECTOMY N/A 10/14/2014  ? Procedure: LAPAROSCOPIC CHOLECYSTECTOMY WITH INTRAOPERATIVE CHOLANGIOGRAM;  Surgeon: Jackolyn Confer, MD;  Location: [place];  Service: General;  Laterality: N/A;  ? CHONDROPLASTY Right 07/13/2017  ? Procedure: CHONDROPLASTY of patella;  Surgeon: Carole Civil, MD;  Location: [facility];  Service: Orthopedics;  Laterality: Right;  ? ESOPHAGEAL DILATION N/A 04/16/2021  ? Procedure: ESOPHAGEAL DILATION;  Surgeon: Montez Morita, Quillian Quince, MD;  [facility];  Service: Gastroenterology;  Laterality: N/A;  ? ESOPHAGOGASTRODUODENOSCOPY (EGD) WITH PROPOFOL N/A 04/16/2021  ? Procedure: ESOPHAGOGASTRODUODENOSCOPY (EGD) WITH PROPOFOL;  Surgeon: Harvel Quale, MD;  [facility];  Service: Gastroenterology;  Laterality: N/A;  1:35, pt knows to arrive at 9:45  ? EUS N/A 08/21/2015  ? Procedure: ESOPHAGEAL ENDOSCOPIC ULTRASOUND (EUS) RADIAL;  Surgeon: Carol Ada, MD;  Location: [facility];  Service: Endoscopy;  Laterality: N/A;  ? KNEE ARTHROSCOPY WITH MEDIAL MENISECTOMY Right 07/13/2017  ? Procedure: KNEE ARTHROSCOPY WITH PARTIAL MEDIAL MENISECTOMY;  Surgeon: Carole Civil, MD;  Location: [facility];  Service: Orthopedics;  Laterality: Right;  ? LEFT HEART CATHETERIZATION WITH CORONARY ANGIOGRAM N/A 09/23/2011  ? Procedure: LEFT HEART CATHETERIZATION WITH CORONARY ANGIOGRAM;  Surgeon: Thayer Headings, MD;  [facility];  Service: Cardiovascular;  Laterality: N/A;  ? LIPOMA EXCISION Left 11/18/2013  ? Procedure: EXCISION OF SOFT TISSUE MASS-LEFT THIGH;  Surgeon: Jamesetta So, MD;  Location: [facility];  Service: General;  Laterality: Left;   ? NASAL SEPTOPLASTY W/ TURBINOPLASTY Bilateral 08/30/2021  ? Procedure: NASAL SEPTOPLASTY WITH BILATERAL TURBINATE REDUCTION;  Surgeon: Leta Baptist, MD;  Location: [place];  Service: ENT;  Laterality: Bilateral;  ? POLYPECTOMY  04/16/2021  ? Procedure: POLYPECTOMY;  Surgeon: Harvel Quale, MD;  [facility];  Service: Gastroenterology;;  gastric  ? RECTOCELE REPAIR    ? x2  ? RECTOCELE REPAIR N/A 04/04/2017  ? Procedure: POSTERIOR REPAIR (RECTOCELE);  Surgeon: Jonnie Kind, MD;  Location: [facility];  Service: Gynecology;  Laterality: N/A;  ? ? ?Current Outpatient Medications  ?Medication Sig Dispense Refill  ? acetaminophen (TYLENOL) 500 MG tablet Take 500-1,000 mg by mouth every 6 (six) hours as needed for moderate pain.    ? ALPRAZolam (XANAX) 1 MG tablet Take 1 tablet (1 mg total) by mouth 3 (three) times daily. 90 tablet 2  ? atorvastatin (LIPITOR) 40 MG tablet Take 1 tablet (40 mg total) by mouth daily. [place]  tablet 3  ? Calcium 500-125 MG-UNIT TABS Take 1 tablet by mouth daily with supper.    ? Carboxymethylcellulose Sod PF 0.5 % SOLN Place 1 drop into both eyes daily as needed (dry eyes).    ? cyclobenzaprine (FLEXERIL) 5 MG tablet TAKE 1 TABLET BY MOUTH THREE TIMES DAILY AS NEEDED FOR MUSCLE SPASMS. 30 tablet 0  ? diclofenac Sodium (VOLTAREN) 1 % GEL Apply 1 application topically daily as needed (pain).    ? dicyclomine (BENTYL) 10 MG capsule TAKE ONE CAPSULE EVERY 12 HOURS AS NEEDED 180 capsule 3  ? estradiol (ESTRACE) 0.1 MG/GM vaginal cream Place 0.5 g vaginally 2 (two) times a week. Place 0.5g nightly for two weeks then twice a week after 30 g 11  ? ezetimibe (ZETIA) 10 MG tablet Take 1 tablet (10 mg total) by mouth daily. 90 tablet 3  ? famotidine (PEPCID) 20 MG tablet Take 20 mg by mouth daily as needed.    ? fluticasone (FLONASE) 50 MCG/ACT nasal spray Place 2 sprays into both nostrils daily.    ? lisdexamfetamine (VYVANSE) 40 MG capsule Take 1 capsule (40 mg total)  by mouth every morning. 30 capsule 0  ? lisinopril (ZESTRIL) 20 MG tablet Take 1 tablet (20 mg total) by mouth daily. 90 tablet 1  ? lubiprostone (AMITIZA) 24 MCG capsule Take 1 capsule (24 mcg total) by

## 2021-09-30 ENCOUNTER — Encounter: Payer: Self-pay | Admitting: Internal Medicine

## 2021-10-01 ENCOUNTER — Other Ambulatory Visit: Payer: Self-pay | Admitting: *Deleted

## 2021-10-01 DIAGNOSIS — R7303 Prediabetes: Secondary | ICD-10-CM

## 2021-10-01 DIAGNOSIS — K7581 Nonalcoholic steatohepatitis (NASH): Secondary | ICD-10-CM

## 2021-10-06 ENCOUNTER — Encounter: Payer: Self-pay | Admitting: Internal Medicine

## 2021-10-07 ENCOUNTER — Encounter (INDEPENDENT_AMBULATORY_CARE_PROVIDER_SITE_OTHER): Payer: Self-pay | Admitting: Gastroenterology

## 2021-10-07 ENCOUNTER — Encounter: Payer: PPO | Attending: Internal Medicine | Admitting: Nutrition

## 2021-10-07 DIAGNOSIS — I10 Essential (primary) hypertension: Secondary | ICD-10-CM | POA: Insufficient documentation

## 2021-10-07 DIAGNOSIS — K7581 Nonalcoholic steatohepatitis (NASH): Secondary | ICD-10-CM | POA: Insufficient documentation

## 2021-10-07 DIAGNOSIS — K589 Irritable bowel syndrome without diarrhea: Secondary | ICD-10-CM | POA: Insufficient documentation

## 2021-10-07 DIAGNOSIS — N1831 Chronic kidney disease, stage 3a: Secondary | ICD-10-CM | POA: Insufficient documentation

## 2021-10-07 NOTE — Progress Notes (Signed)
Medical Nutrition Therapy  ?Appointment Start time:  1100 Appointment End time:  1200 ? ?Primary concerns today: Fatty liver, IBS, ?Referral diagnosis: K75.81, K58.9 ?Preferred learning style:  no preferences   ?Learning readiness: ready  ? ? ?NUTRITION ASSESSMENT  ?61 yr old female with  NASH, GERD, HTN and  IBS and Hyperlipidemia and CKD. ?BP has been 124/76 and has been well controlled at home. ?Reduced Lisinopril to once a day. ?Wants to improve her health and learn how to eat healthier to make her liver better. ?Just started the DASH diet and is feeling better since eating more fruits, vegetables and whole grains. ? ?Has followed the FODMAP some to help GI issues. ? ?Diet is high processed  and refined breads and food products. Diet is low in fiber and whole plant based foods. ? ? ? ?Anthropometrics  ?Wt Readings from Last 3 Encounters:  ?09/21/21 140 lb 1.6 oz (63.5 kg)  ?08/30/21 140 lb 3.4 oz (63.6 kg)  ?07/21/21 135 lb (61.2 kg)  ? ?Ht Readings from Last 3 Encounters:  ?09/21/21 5' 3"  (1.6 m)  ?08/30/21 5' 3"  (1.6 m)  ?07/21/21 5' 3"  (1.6 m)  ? ?There is no height or weight on file to calculate BMI. ?@BMIFA @ ?Facility age limit for growth percentiles is 20 years. ?Facility age limit for growth percentiles is 20 years.  ? ?Clinical ?Medical Hx: IBS, HTN,  ?Medications: see chart ?Labs:  ? ?  Latest Ref Rng & Units 08/25/2021  ?  2:43 PM 08/20/2021  ? 11:21 AM 04/14/2021  ?  2:31 PM  ?CMP  ?Glucose 70 - 99 mg/dL 106    112    ?BUN 6 - 20 mg/dL 22    24    ?Creatinine 0.44 - 1.00 mg/dL 1.10   1.40   1.16    ?Sodium 135 - 145 mmol/L 139    139    ?Potassium 3.5 - 5.1 mmol/L 4.1    4.1    ?Chloride 98 - 111 mmol/L 102    101    ?CO2 22 - 32 mmol/L 29    30    ?Calcium 8.9 - 10.3 mg/dL 9.3    9.5    ? ?Lipid Panel  ?   ?Component Value Date/Time  ? CHOL 189 05/12/2021 0812  ? TRIG 177 (H) 05/12/2021 7741  ? HDL 61 05/12/2021 0812  ? CHOLHDL 3.1 05/12/2021 0812  ? CHOLHDL 2.3 09/16/2015 0939  ? VLDL 26 09/16/2015  0939  ? [place] 98 05/12/2021 0812  ? LABVLDL 30 05/12/2021 0812  ? ? ?Notable Signs/Symptoms: GI Issues,  ? ?Lifestyle & Dietary Hx ?Lives with her husband.  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 Don't eat out much. ?Has had a lot of GI issues. Has IBS-C/D. ?She has been following the DASH diet. ? ? ?Estimated daily fluid intake: 45 oz ?Supplements: VIt D, Calcium, Black Cohosh ?Sleep: 8-9 hours ?Stress / self-care: none ?Current average weekly physical activity: Works on her flower bed. ? ?24-Hr Dietary Recall ?First Meal: 1/2 bagel with cream cheese, coffee ?Snack: water ?Second Meal: [place] wrap with [place], mayo, tomatoes ?Snack:  ?Third Meal: Chicken and dumplings, water ?Snack:graham crackers, and coffee ? ?Estimated Energy Needs ?Calories: 1500 ?Carbohydrate: 170 g ?Protein: 112g ?Fat: 42g ? ? ?NUTRITION DIAGNOSIS  ?NI-5.11.2 Predicted excessive nutrient intake As related to IBS.  As evidenced by chronic constipation, and diarrhea and GI issues of pain, cramping and nausea. ? ? ?NUTRITION INTERVENTION  ?Nutrition education (E-1) on the  following topics:  ?Low Salt Diet ?Side effects of Black Cohosh ?Lifestyle Medicine and FODMAP diet  ? ?- Whole Food, Plant Predominant Nutrition is highly recommended: Eat Plenty of vegetables, Mushrooms, fruits, Legumes, Whole Grains, Nuts, seeds in lieu of processed meats, processed snacks/pastries red meat, poultry, eggs.  ?  ?-It is better to avoid simple carbohydrates including: Cakes, Sweet Desserts, Ice Cream, Soda (diet and regular), Sweet Tea, Candies, Chips, Cookies, Store Bought Juices, Alcohol in Excess of  1-2 drinks a day, Lemonade,  Artificial Sweeteners, Doughnuts, Coffee Creamers, "Sugar-free" Products, etc, etc.  This is not a complete list..... ? ?Exercise: If you are able: 30 -60 minutes a day ,4 days a week, or 150 minutes a week.  The longer the better.  Combine stretch, strength, and aerobic activities.  If you were told in the past that you have high risk for  cardiovascular diseases, you may seek evaluation by your heart doctor prior to initiating moderate to intense exercise programs ? ?Handouts Provided Include  ?My Plate ?Low Salt diet ?FODMAP handout ? ?Learning Style & Readiness for Change ?Teaching method utilized: Visual & Auditory  ?Demonstrated degree of understanding via: Teach Back  ?Barriers to learning/adherence to lifestyle change: none ? ?Goals Established by Pt ?Goals ? ?Follow FODMAP diet ?Drink 80 oz of water ?Walk 15 minutes a day ?Try  4 oz of prune juice if  needed for bowels. ? ? ?MONITORING & EVALUATION ?Dietary intake, weekly physical activity, and GI issues and BP in 1 month. ? ?Next Steps  ?Patient is to work on following a low salt diet. ? ?

## 2021-10-07 NOTE — Patient Instructions (Signed)
Goals ? ?Follow FODMAP diet ?Drink 80 oz of water ?Walk 15 minutes a day ?Try  4 oz of prune juice if  needed for bowels. ?

## 2021-10-13 ENCOUNTER — Encounter: Payer: Self-pay | Admitting: Obstetrics and Gynecology

## 2021-10-13 ENCOUNTER — Ambulatory Visit: Payer: PPO | Admitting: Obstetrics and Gynecology

## 2021-10-13 VITALS — BP 104/69 | HR 73

## 2021-10-13 DIAGNOSIS — M62838 Other muscle spasm: Secondary | ICD-10-CM

## 2021-10-13 DIAGNOSIS — N941 Unspecified dyspareunia: Secondary | ICD-10-CM

## 2021-10-13 DIAGNOSIS — N3281 Overactive bladder: Secondary | ICD-10-CM

## 2021-10-13 MED ORDER — LIDOCAINE-PRILOCAINE 2.5-2.5 % EX CREA: 1.0000 "application " | TOPICAL_CREAM | CUTANEOUS | 5 refills | Status: DC | PRN

## 2021-10-13 MED ORDER — CYCLOBENZAPRINE HCL 5 MG PO TABS: 5.0000 mg | ORAL_TABLET | Freq: Three times a day (TID) | ORAL | 11 refills | Status: DC | PRN

## 2021-10-13 NOTE — Progress Notes (Signed)
[facility] ?Return Visit ? ?SUBJECTIVE  ?History of Present Illness: ?[name] is a 61 y.o. female seen in follow-up for vaginal atrophy, pelvic floor muscle spasm and overactive bladder.  ? ?Last visit she was started on tolterodine for OAB but this caused her "kidneys to hurt" and she requested to be placed back on oxybutynin 42m ER, which was ordered for her. Does not currently have bladder leakage.  ? ?She is using the vaginal dilators. She has worked up to the larger set of dilators.  Was using it every night until she had some issues with her bowels and then has stopped it frequently (on FODMAP diet). She is taking the flexeril at night and it does help with the pelvic pain.  ? ?Now using the estrogen twice a week.  ? ?Past Medical History: ?Patient  has a past medical history of Allergy, Anxiety, Arthritis, Asthma due to seasonal allergies (06/15/2020), Bipolar disorder ([place], Carpal tunnel syndrome, Chest pain (09/2011), Constipation, Depression, Difficulty urinating (05/31/2013), Elevated LFTs (12/16/2013), GERD (gastroesophageal reflux disease), History of kidney stones, Hyperlipemia, Hyperlipidemia, Hypertension, IBS (irritable bowel syndrome), Intracerebral bleed ([place], Lens replaced, Loss of weight (01/06/2015), Osteoporosis, Stage 3a chronic kidney disease ([place] (03/16/2021), and Stroke ([place] ([place]).  ? ?Past Surgical History: ?She  has a past surgical history that includes Cervical fusion; Rectocele repair; Lipoma excision (Left, 11/18/2013); left heart catheterization with coronary angiogram (N/A, 09/23/2011); Cholecystectomy (N/A, 10/14/2014); EUS (N/A, 08/21/2015); Rectocele repair (N/A, 04/04/2017); Abdominal hysterectomy; Cardiac catheterization (2016); Brain surgery (1999); Knee arthroscopy with medial menisectomy (Right, 07/13/2017); Chondroplasty (Right, 07/13/2017); Esophagogastroduodenoscopy (egd) with propofol (N/A, 04/16/2021); Esophageal dilation (N/A, 04/16/2021); biopsy  (04/16/2021); polypectomy (04/16/2021); and Nasal septoplasty w/ turbinoplasty (Bilateral, 08/30/2021).  ? ?Medications: ?She has a current medication list which includes the following prescription(s): acetaminophen, alprazolam, atorvastatin, calcium, carboxymethylcellulose sod pf, diclofenac sodium, dicyclomine, estradiol, ezetimibe, famotidine, lidocaine-prilocaine, lisdexamfetamine, lisinopril, loratadine, lubiprostone, methylphenidate, montelukast, multivitamin with minerals, nystatin, olopatadine hcl, fish oil, omeprazole, oxybutynin, probiotic product, pyridoxine, trazodone, and cyclobenzaprine.  ? ?Allergies: ?Patient is allergic to morphine and related and promethazine hcl.  ? ?Social History: ?Patient  reports that Jasiel L. Cdebaca "LJeani Hawking quit smoking about 24 years ago. Maryfer L. Iracheta "[name]"'s smoking use included cigarettes. Cambri L. Harper "LJeani Hawking has a 19.00 pack-year smoking history. Jayonna L. Delamora "LJeani Hawking has never used smokeless tobacco. Shawny L. Bourbeau "LJeani Hawking reports current alcohol use. Kathrynne L. Lichtenberger "LJeani Hawking reports that Deyjah L. Pasch "LJeani Hawking does not use drugs.  ?  ?  ?OBJECTIVE  ?  ? ?Physical Exam: ?Vitals:  ? 10/13/21 1529  ?BP: 104/69  ?Pulse: 73  ? ?Gen: No apparent distress, A&O x 3. ? ?Detailed Urogynecologic Evaluation:  ?Deferred.   ? ?ASSESSMENT AND PLAN  ?  ?[name] a 61y.o. with:  ?1. Dyspareunia, female   ?2. Levator spasm   ?3. Overactive bladder   ? ?Dyspareunia/ Levator spasm ?- continue with vaginal dilators ?- prescribed lidocaine/ prilocaine cream to place at introitus as needed prior to dilator therapy or intercourse.  ?- continue vaginal estrace cream twice a week ? ?2. OAB ?- continue on oxybutynin 128mER ? ?Return 6 months or sooner if needed ? ?MiJaquita FoldsMD ? ? ? ?

## 2021-10-14 NOTE — Progress Notes (Signed)
Submitted PA for Lidocaine-Prilocaine 2.5-2.5% cream on Cover my Meds. ?Key: [id number]  ?PA Case ID: [id number]  ?Rx #: [id number] ?  ?Status: Approved  ?Prior Auth: Coverage Start Date:10/14/2021; Coverage End Date:01/12/2022  ?--- ? ?Patient paid for rx out of pocket ? ?  ?

## 2021-10-14 NOTE — Patient Instructions (Incomplete)
Cough-stable ?May use Xopenex 2 puffs every 4-6 hours as needed for cough, wheeze, tightness in chest, or shortness of breath. ?Continue Alvesco 80 mcg 1 puff twice a day. Make sure and use this every day ? ?Seasonal and perennial allergic rhinitis(ragweed, indoor and outdoor, cat, dog) ?Continue Allegra once a day as needed for runny nose or itching ?Continue Singulair 10 mg once a day ?Continue Flonase 1 to 2 sprays each nostril once a day as needed for stuffy nose ?May use sinus rinse as needed for nasal symptoms.  Use this prior to any medicated nasal sprays ?Continue azelastine nasal spray 1-2 sprays each nostril 1-2 times a day as needed runny nose/drainage ?May use saline gel for nasal dryness ? ?Reflux ?Continue omeprazole once a day ?Continue Tums as needed ? ?Please let us know if this treatment plan is not working well for you ?Schedule a follow-up appointment in months ?

## 2021-10-15 ENCOUNTER — Encounter: Payer: Self-pay | Admitting: Orthopedic Surgery

## 2021-10-15 ENCOUNTER — Ambulatory Visit: Payer: PPO | Admitting: Family

## 2021-10-17 ENCOUNTER — Other Ambulatory Visit (INDEPENDENT_AMBULATORY_CARE_PROVIDER_SITE_OTHER): Payer: Self-pay | Admitting: Gastroenterology

## 2021-10-20 ENCOUNTER — Other Ambulatory Visit: Payer: Self-pay

## 2021-10-20 DIAGNOSIS — M5136 Other intervertebral disc degeneration, lumbar region: Secondary | ICD-10-CM

## 2021-10-20 DIAGNOSIS — G8929 Other chronic pain: Secondary | ICD-10-CM

## 2021-10-21 DIAGNOSIS — H43812 Vitreous degeneration, left eye: Secondary | ICD-10-CM | POA: Diagnosis not present

## 2021-10-21 DIAGNOSIS — Z961 Presence of intraocular lens: Secondary | ICD-10-CM | POA: Diagnosis not present

## 2021-10-21 DIAGNOSIS — H35432 Paving stone degeneration of retina, left eye: Secondary | ICD-10-CM | POA: Diagnosis not present

## 2021-10-21 NOTE — Patient Instructions (Addendum)
Cough-stable ?Start azelastine nasal spray using 1 spray each nostril twice a as needed for runny nose/drainage down throat ? ?Seasonal and perennial allergic rhinitis(ragweed, indoor and outdoor molds, cat, dog) ?Continue Claritin once a day as needed for runny nose or itching ?Continue Singulair 10 mg once a day ?Continue Flonase 1 to 2 sprays each nostril once a day as needed for stuffy nose ?May use sinus rinse as needed for nasal symptoms.  Use this prior to any medicated nasal sprays ?Start azelastine nasal spray 1-2 sprays each nostril 1-2 times a day as needed runny nose/drainage ?May use saline gel for nasal dryness ?CPT code information has not was given for her to find out the cost to start allergy injections.  She will call our office if she is interested in starting ? ?Reflux ?Continue omeprazole once a day ?Continue Tums as needed ? ?Please let us know if this treatment plan is not working well for you ?Schedule a follow-up appointment in 3 months sooner if needed ?

## 2021-10-22 ENCOUNTER — Other Ambulatory Visit: Payer: Self-pay

## 2021-10-22 ENCOUNTER — Ambulatory Visit: Payer: PPO | Admitting: Family

## 2021-10-22 ENCOUNTER — Encounter: Payer: Self-pay | Admitting: Family

## 2021-10-22 ENCOUNTER — Encounter (HOSPITAL_COMMUNITY): Payer: Self-pay | Admitting: Psychiatry

## 2021-10-22 ENCOUNTER — Telehealth (INDEPENDENT_AMBULATORY_CARE_PROVIDER_SITE_OTHER): Payer: PPO | Admitting: Psychiatry

## 2021-10-22 VITALS — BP 120/76 | HR 77 | Temp 97.6°F | Resp 17 | Ht 63.0 in | Wt 139.6 lb

## 2021-10-22 DIAGNOSIS — R059 Cough, unspecified: Secondary | ICD-10-CM

## 2021-10-22 DIAGNOSIS — J3089 Other allergic rhinitis: Secondary | ICD-10-CM | POA: Diagnosis not present

## 2021-10-22 DIAGNOSIS — F332 Major depressive disorder, recurrent severe without psychotic features: Secondary | ICD-10-CM

## 2021-10-22 MED ORDER — ALPRAZOLAM 1 MG PO TABS: 1.0000 mg | ORAL_TABLET | Freq: Three times a day (TID) | ORAL | 2 refills | Status: DC

## 2021-10-22 MED ORDER — TRAZODONE HCL 100 MG PO TABS
200.0000 mg | ORAL_TABLET | Freq: Every day | ORAL | 2 refills | Status: DC
Start: 2021-10-22 — End: 2021-11-22

## 2021-10-22 MED ORDER — AZELASTINE HCL 0.1 % NA SOLN
NASAL | 3 refills | Status: DC
Start: 2021-10-22 — End: 2022-02-15

## 2021-10-22 MED ORDER — METHYLPHENIDATE HCL 20 MG PO TABS: 20.0000 mg | ORAL_TABLET | Freq: Two times a day (BID) | ORAL | 0 refills | Status: DC

## 2021-10-22 MED ORDER — VENLAFAXINE HCL ER 150 MG PO CP24: 150.0000 mg | ORAL_CAPSULE | Freq: Every day | ORAL | 2 refills | Status: DC

## 2021-10-22 NOTE — Progress Notes (Signed)
Virtual Visit via Telephone Note ? ?I connected with [name] on 10/22/21 at 10:00 AM EDT by telephone and verified that I am speaking with the correct person using two identifiers. ? ?Location: ?Patient: home ?Provider: office ?  ?I discussed the limitations, risks, security and privacy concerns of performing an evaluation and management service by telephone and the availability of in person appointments. I also discussed with the patient that there may be a patient responsible charge related to this service. The patient expressed understanding and agreed to proceed. ? ? ? ?  ?I discussed the assessment and treatment plan with the patient. The patient was provided an opportunity to ask questions and all were answered. The patient agreed with the plan and demonstrated an understanding of the instructions. ?  ?The patient was advised to call back or seek an in-person evaluation if the symptoms worsen or if the condition fails to improve as anticipated. ? ?I provided 15 minutes of non-face-to-face time during this encounter. ? ? ?[name], MD ? ?BH MD/PA/NP OP Progress Note ? ?10/22/2021 10:28 AM ?[name]  ?MRN:  [id number] ? ?Chief Complaint:  ?Chief Complaint  ?Patient presents with  ? ADD  ? Anxiety  ? Depression  ? Follow-up  ? ?HPI: T his patient is a 61 year old married white female who lives with her husband in [place]. She has no children. 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 ? ?The patient returns after 3 months regarding her depression and anxiety and difficulties with focus.  She states she has not been able to find the methylphenidate at any of the pharmacies.  Consequently she is more tired unfocused and wants to sleep all the time.  She is also having problems with arthralgias as well as chronic constipation and diarrhea and abdominal pain.  She is followed by GI.  At times she is very frustrated with all of the symptoms as it  keeps her from doing things that she would like to do.  She states she did much better when she had the methylphenidate but currently her pharmacy is out.  I urged her to call around to various pharmacies so we can get her back started on it. ?Visit Diagnosis:  ?  ICD-10-CM   ?1. Major depressive disorder, recurrent, severe without psychotic features ([place])  F33.2   ?  ? ? ?Past Psychiatric History: Past outpatient treatment for depression ? ?Past Medical History:  ?Past Medical History:  ?Diagnosis Date  ? Allergy   ? grass, dust , mold  ? Anxiety   ? Arthritis   ? Asthma due to seasonal allergies 06/15/2020  ? Bipolar disorder ([place])   ? Carpal tunnel syndrome   ? Bilateral  ? Chest pain 09/2011  ? Cardiac cath-normal coronaries  ? Constipation   ? Depression   ? Difficulty urinating 05/31/2013  ? Elevated LFTs 12/16/2013  ? GERD (gastroesophageal reflux disease)   ? History of kidney stones   ? Hyperlipemia   ? Hyperlipidemia   ? Hypertension   ? Mild; provoked by stress and anxiety  ? IBS (irritable bowel syndrome)   ? Intracerebral bleed ([place])   ? No aneurysm; followed by Dr. Sherwood Gambler  ? Lens replaced   ? "lense transplant" 2022; pt states lense don't dilate or constrict  ? Loss of weight 01/06/2015  ? Osteoporosis   ? Stage 3a chronic kidney disease ([place]) 03/16/2021  ? Stroke [facility]) 1999  ?  hemorrhagic stroke; weakness of left side  ?  ?Past Surgical History:  ?Procedure Laterality Date  ? ABDOMINAL HYSTERECTOMY    ? "cancer cells"  ? BIOPSY  04/16/2021  ? Procedure: BIOPSY;  Surgeon: Montez Morita, Quillian Quince, MD;  [facility];  Service: Gastroenterology;;  small, bowel, esophageal(proximal and distal);  ? [place]  ? to remove blood clot after stroke   ? CARDIAC CATHETERIZATION  2016  ? CERVICAL FUSION    ? CHOLECYSTECTOMY N/A 10/14/2014  ? Procedure: LAPAROSCOPIC CHOLECYSTECTOMY WITH INTRAOPERATIVE CHOLANGIOGRAM;  Surgeon: Jackolyn Confer, MD;  Location: [facility];  Service: General;   Laterality: N/A;  ? CHONDROPLASTY Right 07/13/2017  ? Procedure: CHONDROPLASTY of patella;  Surgeon: Carole Civil, MD;  Location: [facility];  Service: Orthopedics;  Laterality: Right;  ? ESOPHAGEAL DILATION N/A 04/16/2021  ? Procedure: ESOPHAGEAL DILATION;  Surgeon: Montez Morita, Quillian Quince, MD;  [facility];  Service: Gastroenterology;  Laterality: N/A;  ? ESOPHAGOGASTRODUODENOSCOPY (EGD) WITH PROPOFOL N/A 04/16/2021  ? Procedure: ESOPHAGOGASTRODUODENOSCOPY (EGD) WITH PROPOFOL;  Surgeon: Harvel Quale, MD;  [facility];  Service: Gastroenterology;  Laterality: N/A;  1:35, pt knows to arrive at 9:45  ? EUS N/A 08/21/2015  ? Procedure: ESOPHAGEAL ENDOSCOPIC ULTRASOUND (EUS) RADIAL;  Surgeon: Carol Ada, MD;  [facility];  Service: Endoscopy;  Laterality: N/A;  ? KNEE ARTHROSCOPY WITH MEDIAL MENISECTOMY Right 07/13/2017  ? Procedure: KNEE ARTHROSCOPY WITH PARTIAL MEDIAL MENISECTOMY;  Surgeon: Carole Civil, MD;  Location: [facility];  Service: Orthopedics;  Laterality: Right;  ? LEFT HEART CATHETERIZATION WITH CORONARY ANGIOGRAM N/A 09/23/2011  ? Procedure: LEFT HEART CATHETERIZATION WITH CORONARY ANGIOGRAM;  Surgeon: Thayer Headings, MD;  Location: [facility];  Service: Cardiovascular;  Laterality: N/A;  ? LIPOMA EXCISION Left 11/18/2013  ? Procedure: EXCISION OF SOFT TISSUE MASS-LEFT THIGH;  Surgeon: Jamesetta So, MD;  Location: [facility];  Service: General;  Laterality: Left;  ? NASAL SEPTOPLASTY W/ TURBINOPLASTY Bilateral 08/30/2021  ? Procedure: NASAL SEPTOPLASTY WITH BILATERAL TURBINATE REDUCTION;  Surgeon: Leta Baptist, MD;  Location: [facility];  Service: ENT;  Laterality: Bilateral;  ? POLYPECTOMY  04/16/2021  ? Procedure: POLYPECTOMY;  Surgeon: Harvel Quale, MD;  [facility];  Service: Gastroenterology;;  gastric  ? RECTOCELE REPAIR    ? x2  ? RECTOCELE REPAIR N/A 04/04/2017  ? Procedure: POSTERIOR REPAIR (RECTOCELE);   Surgeon: Jonnie Kind, MD;  Location: [facility];  Service: Gynecology;  Laterality: N/A;  ? ? ?Family Psychiatric History: see below ? ?Family History:  ?Family History  ?Problem Relation Age of Onset  ? Cancer Mother   ?     breast   ? Hypertension Mother   ? Hyperlipidemia Mother   ? Depression Mother   ? Anxiety disorder Mother   ? COPD Mother   ? Arthritis Mother   ?     rheumatoid  ? Drug abuse Sister   ? Coronary artery disease Paternal Grandfather   ? Coronary artery disease Paternal Uncle   ? Depression Cousin   ? Drug abuse Cousin   ? ? ?Social History:  ?Social History  ? ?Socioeconomic History  ? Marital status: Married  ?  Spouse name: Sonia Side  ? Number of children: 0  ? Years of education: HS  ? Highest education level: Not on file  ?Occupational History  ? Occupation: unemployed  ?  Comment: pending disability  ?Tobacco Use  ? Smoking status: Former  ?  Packs/day: 1.00  ?  Years: 19.00  ?  Pack years: 19.00  ?  Types: Cigarettes  ?  Quit date: 09/01/1997  ?  Years since quitting: 24.1  ? Smokeless tobacco: Never  ? Tobacco comments:  ?  Quit smoking 1999 , previous 20 pack years  ?Vaping Use  ? Vaping Use: Never used  ?Substance and Sexual Activity  ? Alcohol use: Yes  ?  Comment: 1 drink every other week  ? Drug use: No  ? Sexual activity: Not Currently  ?  Partners: Male  ?  Birth control/protection: Surgical  ?  Comment: hyst   ?Other Topics Concern  ? Not on file  ?Social History Narrative  ? Currently unable to work  ? Lives in [place]  ? Married  ? Patient drinks 1 cup of caffeine daily.  ? Patient is right handed.   ? Joined the [employer] to get more exercise  ? ?Social Determinants of Health  ? ?Financial Resource Strain: Low Risk   ? Difficulty of Paying Living Expenses: Not hard at all  ?Food Insecurity: No Food Insecurity  ? Worried About Charity fundraiser in the Last Year: Never true  ? Ran Out of Food in the Last Year: Never true  ?Transportation Needs: No Transportation Needs  ? Lack of  Transportation (Medical): No  ? Lack of Transportation (Non-Medical): No  ?Physical Activity: Inactive  ? Days of Exercise per Week: 0 days  ? Minutes of Exercise per Session: 0 min  ?Stress: No Stress Concern Present

## 2021-10-22 NOTE — Progress Notes (Signed)
? ?[address], [place] [address] ?Dept: [phone] ? ?FOLLOW UP NOTE ? ?Patient ID: [name], female    DOB: 06/01/1961  Age: 61 y.o. MRN: [id number] ?Date of Office Visit: 10/22/2021 ? ?Assessment  ?Chief Complaint: No chief complaint on file. ? ?HPI ?[name] is a 61 year old female who presents today for follow-up of shortness of breath, cough, and seasonal and perennial allergic rhinitis.  She was last seen on September 09, 2020.  Since her last office visit she had bilateral turbinate reduction on August 30, 2021 by Dr. Benjamine Mola. ? ?She reports that she continues to cough, but it is not every night.  She feels that the cough is due to something in her throat.  She denies wheezing, tightness in her chest, and shortness of breath.  She has not used the Xopenex that was prescribed previously or Alvesco 80 mcg that was prescribed previously. ? ?Seasonal and perennial allergic rhinitis is reported as doing better since having  sinus surgery.  She would like to start allergy injections and has questions about getting these injections at home.  She reports that she used to do allergy injections at home.  Instructed her that we now only do injections in the office due to the risk of anaphylaxis.  She is going to call her insurance to find out the cost of getting allergy injections.  She reports some postnasal drip at night and denies rhinorrhea and nasal congestion.  She has not had any sinus infections since we last saw her.  She takes Claritin 10 mg once a day, Singulair 10 mg at night, Flonase nasal spray at night, and saline rinses as needed.  She is not using azelastine nasal spray.  She reports that she does have a dog at home, but does not feel like the dog is the problem. ? ?Reflux is reported as controlled with omeprazole once a day.  She denies any heartburn or reflux symptoms. ? ? ?Drug Allergies:  ?Allergies  ?Allergen Reactions  ? Morphine And Related Hives  ? Promethazine  Hcl Other (See Comments)  ?  Causes patient to become Hyper  ? ? ?Review of Systems: ?Review of Systems  ?Constitutional:  Negative for chills and fever.  ?HENT:    ?     Reports some postnasal drip and denies rhinorrhea and nasal congestion  ?Eyes:   ?     Reports some itchy eyes and some dry eyes.  She reports she has Pataday eyedrops and refresh.  She her eye doctor yesterday.  ?Respiratory:  Positive for cough. Negative for shortness of breath and wheezing.   ?     She reports cough due to post nasal drip and denies rhinorrhea and nasal congestion  ?Cardiovascular:  Negative for chest pain and palpitations.  ?Gastrointestinal:   ?     Denies heartburn or reflux symptoms with omeprazole once a day  ?Genitourinary:  Negative for frequency.  ?Skin:  Negative for itching and rash.  ?Neurological:  Negative for headaches.  ?Endo/Heme/Allergies:  Positive for environmental allergies.  ? ? ?Physical Exam: ?BP 120/76   Pulse 77   Temp 97.6 ?F (36.4 ?C) (Temporal)   Resp 17   Ht 5' 3"  (1.6 m)   Wt 139 lb 9.6 oz (63.3 kg)   SpO2 94%   BMI 24.73 kg/m?   ? ?Physical Exam ?Constitutional:   ?   Appearance: Normal appearance.  ?HENT:  ?   Head: Normocephalic and atraumatic.  ?  Comments: Pharynx normal, eyes normal, ears normal, nose normal ?   Right Ear: Tympanic membrane, ear canal and external ear normal.  ?   Left Ear: Tympanic membrane, ear canal and external ear normal.  ?   Nose: Nose normal.  ?   Mouth/Throat:  ?   Mouth: Mucous membranes are moist.  ?   Pharynx: Oropharynx is clear.  ?Eyes:  ?   Conjunctiva/sclera: Conjunctivae normal.  ?Cardiovascular:  ?   Rate and Rhythm: Normal rate and regular rhythm.  ?   Heart sounds: Normal heart sounds.  ?Pulmonary:  ?   Effort: Pulmonary effort is normal.  ?   Breath sounds: Normal breath sounds.  ?   Comments: Lungs clear to auscultation ?Musculoskeletal:  ?   Cervical back: Neck supple.  ?Skin: ?   General: Skin is warm.  ?Neurological:  ?   Mental Status: Halei L  Isham "[name]" is alert and oriented to person, place, and time.  ?Psychiatric:     ?   Mood and Affect: Mood normal.     ?   Behavior: Behavior normal.     ?   Thought Content: Thought content normal.     ?   Judgment: Judgment normal.  ? ? ?Diagnostics: ?None ? ?Assessment and Plan: ?1. Seasonal and perennial allergic rhinitis   ?2. Cough, unspecified type   ? ? ?Meds ordered this encounter  ?Medications  ? azelastine (ASTELIN) 0.1 % nasal spray  ?  Sig: Place 1 spray in each nostril twice a day as needed for runny nose/drainage down the throat  ?  Dispense:  30 mL  ?  Refill:  3  ? ? ?Patient Instructions  ?Cough-stable ?Start azelastine nasal spray using 1 spray each nostril twice a as needed for runny nose/drainage down throat ? ?Seasonal and perennial allergic rhinitis(ragweed, indoor and outdoor molds, cat, dog) ?Continue Claritin once a day as needed for runny nose or itching ?Continue Singulair 10 mg once a day ?Continue Flonase 1 to 2 sprays each nostril once a day as needed for stuffy nose ?May use sinus rinse as needed for nasal symptoms.  Use this prior to any medicated nasal sprays ?Start azelastine nasal spray 1-2 sprays each nostril 1-2 times a day as needed runny nose/drainage ?May use saline gel for nasal dryness ?CPT code information has not was given for her to find out the cost to start allergy injections.  She will call our office if she is interested in starting ? ?Reflux ?Continue omeprazole once a day ?Continue Tums as needed ? ?Please let us know if this treatment plan is not working well for you ?Schedule a follow-up appointment in 3 months sooner if needed ?Return in about 3 months (around 01/21/2022), or if symptoms worsen or fail to improve. ?  ? ?Thank you for the opportunity to care for this patient.  Please do not hesitate to contact me with questions. ? ?Althea Charon, FNP ?[facility] ? ? ? ? ?

## 2021-10-25 ENCOUNTER — Telehealth (HOSPITAL_COMMUNITY): Payer: Self-pay | Admitting: *Deleted

## 2021-10-25 ENCOUNTER — Other Ambulatory Visit (HOSPITAL_BASED_OUTPATIENT_CLINIC_OR_DEPARTMENT_OTHER): Payer: Self-pay

## 2021-10-25 MED ORDER — METHYLPHENIDATE HCL 20 MG PO TABS: 20.0000 mg | ORAL_TABLET | Freq: Two times a day (BID) | ORAL | 0 refills | Status: DC

## 2021-10-25 NOTE — Telephone Encounter (Signed)
Patient aware.

## 2021-10-25 NOTE — Telephone Encounter (Signed)
Patient called stating [facility] do not have methylphenidate (RITALIN) 20 MG tablet in stock. Per pt she [facility] [address] in [place] have it in stock and would like for provider to please send it there.  ? ? ?

## 2021-10-25 NOTE — Addendum Note (Signed)
Addended by: Berniece Andreas T on: 10/25/2021 10:33 AM ? ? Modules accepted: Orders ? ?

## 2021-10-25 NOTE — Telephone Encounter (Signed)
Send to requested [facility]. ?

## 2021-10-25 NOTE — Telephone Encounter (Signed)
LMOM

## 2021-10-29 ENCOUNTER — Other Ambulatory Visit: Payer: Self-pay | Admitting: Adult Health

## 2021-10-29 MED ORDER — FLUCONAZOLE 150 MG PO TABS: ORAL_TABLET | ORAL | 1 refills | Status: DC

## 2021-10-29 NOTE — Progress Notes (Signed)
Will rx diflcuan ?

## 2021-11-01 ENCOUNTER — Other Ambulatory Visit: Payer: Self-pay | Admitting: Orthopedic Surgery

## 2021-11-01 DIAGNOSIS — Z1159 Encounter for screening for other viral diseases: Secondary | ICD-10-CM | POA: Diagnosis not present

## 2021-11-01 DIAGNOSIS — D7589 Other specified diseases of blood and blood-forming organs: Secondary | ICD-10-CM | POA: Diagnosis not present

## 2021-11-01 DIAGNOSIS — R7303 Prediabetes: Secondary | ICD-10-CM | POA: Diagnosis not present

## 2021-11-01 DIAGNOSIS — E559 Vitamin D deficiency, unspecified: Secondary | ICD-10-CM | POA: Diagnosis not present

## 2021-11-01 DIAGNOSIS — Z0001 Encounter for general adult medical examination with abnormal findings: Secondary | ICD-10-CM | POA: Diagnosis not present

## 2021-11-02 LAB — HEMOGLOBIN A1C
Est. average glucose Bld gHb Est-mCnc: 111 mg/dL
Hgb A1c MFr Bld: 5.5 % (ref 4.8–5.6)

## 2021-11-02 LAB — CMP14+EGFR
ALT: 29 IU/L (ref 0–32)
AST: 18 IU/L (ref 0–40)
Albumin/Globulin Ratio: 1.8 (ref 1.2–2.2)
Albumin: 4.7 g/dL (ref 3.8–4.9)
Alkaline Phosphatase: 103 IU/L (ref 44–121)
BUN/Creatinine Ratio: 19 (ref 9–23)
BUN: 20 mg/dL (ref 6–24)
Bilirubin Total: 0.3 mg/dL (ref 0.0–1.2)
CO2: 26 mmol/L (ref 20–29)
Calcium: 9.9 mg/dL (ref 8.7–10.2)
Chloride: 103 mmol/L (ref 96–106)
Creatinine, Ser: 1.06 mg/dL — ABNORMAL HIGH (ref 0.57–1.00)
Globulin, Total: 2.6 g/dL (ref 1.5–4.5)
Glucose: 101 mg/dL — ABNORMAL HIGH (ref 70–99)
Potassium: 5 mmol/L (ref 3.5–5.2)
Sodium: 144 mmol/L (ref 134–144)
Total Protein: 7.3 g/dL (ref 6.0–8.5)
eGFR: 61 mL/min/{1.73_m2} (ref >59)

## 2021-11-02 LAB — CBC WITH DIFFERENTIAL/PLATELET
Basophils Absolute: 0.1 10*3/uL (ref 0.0–0.2)
Basos: 1 %
EOS (ABSOLUTE): 0.2 10*3/uL (ref 0.0–0.4)
Eos: 4 %
Hematocrit: 36.5 % (ref 34.0–46.6)
Hemoglobin: 12.5 g/dL (ref 11.1–15.9)
Immature Grans (Abs): 0.1 10*3/uL (ref 0.0–0.1)
Immature Granulocytes: 1 %
Lymphocytes Absolute: 1.9 10*3/uL (ref 0.7–3.1)
Lymphs: 30 %
MCH: 33.2 pg — ABNORMAL HIGH (ref 26.6–33.0)
MCHC: 34.2 g/dL (ref 31.5–35.7)
MCV: 97 fL (ref 79–97)
Monocytes Absolute: 0.5 10*3/uL (ref 0.1–0.9)
Monocytes: 8 %
Neutrophils Absolute: 3.4 10*3/uL (ref 1.4–7.0)
Neutrophils: 56 %
Platelets: 349 10*3/uL (ref 150–450)
RBC: 3.77 x10E6/uL (ref 3.77–5.28)
RDW: 12.5 % (ref 11.7–15.4)
WBC: 6.1 10*3/uL (ref 3.4–10.8)

## 2021-11-02 LAB — HEPATITIS C ANTIBODY: Hep C Virus Ab: NONREACTIVE

## 2021-11-02 LAB — VITAMIN D 25 HYDROXY (VIT D DEFICIENCY, FRACTURES): Vit D, 25-Hydroxy: 53.4 ng/mL (ref 30.0–100.0)

## 2021-11-02 LAB — VITAMIN B12: Vitamin B-12: 1026 pg/mL (ref 232–1245)

## 2021-11-02 NOTE — Patient Instructions (Addendum)
Cough-stable ?Stop azelastine nasal spray due to it causing a burning sensation and also since you are not really having a cough ? ?Seasonal and perennial allergic rhinitis(ragweed, indoor and outdoor molds, cat, dog) ?Continue Claritin once a day as needed for runny nose or itching ?Continue Singulair 10 mg once a day ?Continue Flonase 1 to 2 sprays each nostril once a day as needed for stuffy nose ?May use sinus rinse as needed for nasal symptoms.  Use this prior to any medicated nasal sprays ?Continue azelastine nasal spray 1-2 sprays each nostril 1-2 times a day as needed runny nose/drainage ?May use saline gel for nasal dryness ?Continue to consider allergy injections.  After you have called your insurance to find out if allergy injections are affordable give our office a call and we can set up appointment to start allergy injections ? ?Reflux ?Continue omeprazole once a day ?Continue Tums as needed ? ?Please let us know if this treatment plan is not working well for you ?Schedule a follow-up appointment in 3 months sooner if needed ?

## 2021-11-03 ENCOUNTER — Ambulatory Visit: Payer: PPO | Admitting: Family

## 2021-11-03 ENCOUNTER — Encounter: Payer: Self-pay | Admitting: Family

## 2021-11-03 ENCOUNTER — Telehealth: Payer: Self-pay | Admitting: Family

## 2021-11-03 ENCOUNTER — Encounter: Payer: Self-pay | Admitting: Nutrition

## 2021-11-03 VITALS — BP 128/80 | HR 86 | Temp 97.9°F | Resp 16 | Ht 63.0 in | Wt 138.0 lb

## 2021-11-03 DIAGNOSIS — J3089 Other allergic rhinitis: Secondary | ICD-10-CM

## 2021-11-03 DIAGNOSIS — J302 Other seasonal allergic rhinitis: Secondary | ICD-10-CM

## 2021-11-03 DIAGNOSIS — R058 Other specified cough: Secondary | ICD-10-CM

## 2021-11-03 MED ORDER — FLUTICASONE PROPIONATE 50 MCG/ACT NA SUSP: 1.0000 | Freq: Every day | NASAL | 5 refills | Status: DC

## 2021-11-03 MED ORDER — MONTELUKAST SODIUM 10 MG PO TABS: 10.0000 mg | ORAL_TABLET | Freq: Every day | ORAL | 1 refills | Status: DC

## 2021-11-03 MED ORDER — OMEPRAZOLE 40 MG PO CPDR
40.0000 mg | DELAYED_RELEASE_CAPSULE | Freq: Every day | ORAL | 5 refills | Status: DC
Start: 2021-11-03 — End: 2022-03-16

## 2021-11-03 NOTE — Telephone Encounter (Signed)
Hi [name], ? ?[name] has tried calling her health insurance to find out the cost of starting allergy injections and she was instructed that we needed to submit a claim before they could tell her a cost. Can you possibly help her? ? ?Thank you, ?Althea Charon, FNP ?

## 2021-11-03 NOTE — Progress Notes (Signed)
? ?[address], [place] [address] ?Dept: [phone] ? ?FOLLOW UP NOTE ? ?Patient ID: [name], female    DOB: July 11, 1960  Age: 61 y.o. MRN: [id number] ?Date of Office Visit: 11/03/2021 ? ?Assessment  ?Chief Complaint: Allergic Rhinitis  and Asthma ? ?HPI ?[name] is a 61 year old female who presents today for follow-up of seasonal and perennial allergic rhinitis, and cough.  She was last seen on October 22, 2021 by myself.  Since her last office visit she reports that she has been diagnosed as prediabetic. ? ?Cough is reported as well.  She reports that she has not really had a cough. ? ?Seasonal and perennial allergic rhinitis is reported as moderately controlled with Singulair 10 mg once a day, Claritin 10 mg once a day, and Flonase 1 to 2 sprays each nostril once a day.  She stopped using azelastine nasal spray due to it causing a burning sensation and it ran down her throat and caused her to gag..  She reports a little bit of postnasal drip and denies rhinorrhea and nasal congestion.  She has not had any sinus infections since we last saw her.  She is interested in starting allergy injections, but has not called to find out the cost of the injections.  There was some misunderstanding of what she needed to do.  All questions answered.  Instructed her to call us if she has any other questions. ? ?Reflux is reported as controlled with omeprazole once a day and Tums as needed.  She denies heartburn or reflux. ? ?She has questions today about nutrition and her diagnosis of prediabetes.  Instructed that she could speak with her primary care physician about setting her up with a nutritionist.  She reports that she has spoken with one in the past, but was not able to get the foods that they recommended. ? ? ?Drug Allergies:  ?Allergies  ?Allergen Reactions  ? Morphine And Related Hives  ? Promethazine Hcl Other (See Comments)  ?  Causes patient to become Hyper  ? ? ?Review of  Systems: ?Review of Systems  ?Constitutional:  Negative for chills and fever.  ?HENT:    ?     Reports may be a little bit of postnasal drip and denies rhinorrhea and nasal congestion  ?Eyes:   ?     Reports itchy watery eyes for which she has eyedrops  ?Respiratory:  Negative for cough, shortness of breath and wheezing.   ?     Reports that she has not really had an issue with cough now.  ?Cardiovascular:  Positive for chest pain. Negative for palpitations.  ?     Reports chest pain only when she is upset or anxious.  Denies palpitations  ?Gastrointestinal:   ?     Denies heartburn or reflux symptoms with omeprazole once a day and Tums as needed  ?Genitourinary:  Negative for frequency.  ?Skin:  Negative for itching and rash.  ?Neurological:  Positive for headaches.  ?     Reports headaches.  She reports that Dr. Benjamine Mola, her ENT, said that she could have these for 3 months after sinus surgery.  ?Endo/Heme/Allergies:  Positive for environmental allergies.  ? ? ?Physical Exam: ?BP 128/80   Pulse 86   Temp 97.9 ?F (36.6 ?C)   Resp 16   Ht 5' 3"  (1.6 m)   Wt 138 lb (62.6 kg)   SpO2 96%   BMI 24.45 kg/m?   ? ?Physical Exam ?  Constitutional:   ?   Appearance: Normal appearance.  ?HENT:  ?   Head: Normocephalic and atraumatic.  ?   Comments: Pharynx normal, eyes normal, ears normal, nose normal ?   Right Ear: Tympanic membrane, ear canal and external ear normal.  ?   Left Ear: Tympanic membrane, ear canal and external ear normal.  ?   Nose: Nose normal.  ?   Mouth/Throat:  ?   Mouth: Mucous membranes are moist.  ?   Pharynx: Oropharynx is clear.  ?Eyes:  ?   Conjunctiva/sclera: Conjunctivae normal.  ?Cardiovascular:  ?   Rate and Rhythm: Normal rate and regular rhythm.  ?   Heart sounds: Normal heart sounds.  ?Pulmonary:  ?   Effort: Pulmonary effort is normal.  ?   Breath sounds: Normal breath sounds.  ?   Comments: Lungs clear to auscultation ?Musculoskeletal:  ?   Cervical back: Neck supple.  ?Skin: ?   General:  Skin is warm.  ?Neurological:  ?   Mental Status: [name] "[name]" is alert and oriented to person, place, and time.  ?Psychiatric:     ?   Mood and Affect: Mood normal.     ?   Behavior: Behavior normal.     ?   Thought Content: Thought content normal.     ?   Judgment: Judgment normal.  ? ? ?Diagnostics: ?FVC 2.74 L (89%), FEV1 1.83 L, (75%).  Predicted FVC 3.09 L, FEV1 2.44 L.  Spirometry indicates possible mild obstruction.  She denies cough, wheeze, tightness in chest, and shortness of breath. ? ?Assessment and Plan: ?1. Seasonal and perennial allergic rhinitis   ?2. Other cough   ? ? ?Meds ordered this encounter  ?Medications  ? montelukast (SINGULAIR) 10 MG tablet  ?  Sig: Take 1 tablet (10 mg total) by mouth at bedtime.  ?  Dispense:  90 tablet  ?  Refill:  1  ? fluticasone (FLONASE) 50 MCG/ACT nasal spray  ?  Sig: Place 1-2 sprays into both nostrils daily.  ?  Dispense:  16 g  ?  Refill:  5  ? omeprazole (PRILOSEC) 40 MG capsule  ?  Sig: Take 1 capsule (40 mg total) by mouth daily.  ?  Dispense:  30 capsule  ?  Refill:  5  ? ? ?Patient Instructions  ?Cough-stable ?Stop azelastine nasal spray due to it causing a burning sensation and also since you are not really having a cough ? ?Seasonal and perennial allergic rhinitis(ragweed, indoor and outdoor molds, cat, dog) ?Continue Claritin once a day as needed for runny nose or itching ?Continue Singulair 10 mg once a day ?Continue Flonase 1 to 2 sprays each nostril once a day as needed for stuffy nose ?May use sinus rinse as needed for nasal symptoms.  Use this prior to any medicated nasal sprays ?Continue azelastine nasal spray 1-2 sprays each nostril 1-2 times a day as needed runny nose/drainage ?May use saline gel for nasal dryness ?Continue to consider allergy injections.  After you have called your insurance to find out if allergy injections are affordable give our office a call and we can set up appointment to start allergy  injections ? ?Reflux ?Continue omeprazole once a day ?Continue Tums as needed ? ?Please let us know if this treatment plan is not working well for you ?Schedule a follow-up appointment in 3 months sooner if needed ?Return in about 3 months (around 02/03/2022), or if symptoms worsen or fail to improve. ?  ? ?  Thank you for the opportunity to care for this patient.  Please do not hesitate to contact me with questions. ? ?Althea Charon, FNP ?[facility] ? ? ? ? ?

## 2021-11-03 NOTE — Telephone Encounter (Signed)
Thank you :)

## 2021-11-04 ENCOUNTER — Encounter: Payer: Self-pay | Admitting: Internal Medicine

## 2021-11-04 ENCOUNTER — Ambulatory Visit: Payer: PPO

## 2021-11-04 ENCOUNTER — Ambulatory Visit (INDEPENDENT_AMBULATORY_CARE_PROVIDER_SITE_OTHER): Payer: PPO | Admitting: Internal Medicine

## 2021-11-04 VITALS — BP 132/84 | HR 82 | Resp 18 | Ht 63.0 in | Wt 139.2 lb

## 2021-11-04 DIAGNOSIS — M47816 Spondylosis without myelopathy or radiculopathy, lumbar region: Secondary | ICD-10-CM

## 2021-11-04 DIAGNOSIS — I69359 Hemiplegia and hemiparesis following cerebral infarction affecting unspecified side: Secondary | ICD-10-CM

## 2021-11-04 DIAGNOSIS — I10 Essential (primary) hypertension: Secondary | ICD-10-CM

## 2021-11-04 DIAGNOSIS — K581 Irritable bowel syndrome with constipation: Secondary | ICD-10-CM | POA: Diagnosis not present

## 2021-11-04 NOTE — Assessment & Plan Note (Signed)
In 1999, had residual left sided hemiplegia, no neurologic symptoms related to stroke currently ?On statin and Zetia currently ?
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
## 2021-11-04 NOTE — Assessment & Plan Note (Signed)
BP Readings from Last 1 Encounters:  ?11/04/21 132/84  ? ?Well-controlled with lisinopril 20 mg QD ?Counseled for compliance with the medications ?Advised DASH diet and moderate exercise/walking, at least 150 mins/week ?

## 2021-11-04 NOTE — Assessment & Plan Note (Addendum)
Chronic low back pain ?Previous MRI of lumbar spine showed lumbar spondylosis ?Takes Tylenol and Flexeril as needed ?Has tried Voltaren gel ?Advised to avoid heavy lifting and frequent bending ?Referred to [facility] -Dr. Ernestina Patches as she wants to discuss epidural injections ?

## 2021-11-04 NOTE — Patient Instructions (Addendum)
Please continue taking medications as prescribed. ? ?Okay to take Fish oil 1000 mg twice daily. ? ?Please continue to follow diet as suggested by your GI physician. ?

## 2021-11-04 NOTE — Assessment & Plan Note (Signed)
IBS-C, well controlled with Amitiza, but has diarrhea with it ?Advised to alternate with Miralax ?Followed by GI ?

## 2021-11-05 ENCOUNTER — Telehealth (INDEPENDENT_AMBULATORY_CARE_PROVIDER_SITE_OTHER): Payer: Self-pay

## 2021-11-05 ENCOUNTER — Encounter: Payer: Self-pay | Admitting: Internal Medicine

## 2021-11-05 NOTE — Telephone Encounter (Signed)
Patient made aware of all. ?

## 2021-11-05 NOTE — Telephone Encounter (Signed)
Patient decided not to go through with allergy injections.  ?

## 2021-11-05 NOTE — Telephone Encounter (Signed)
Thanks for the update.  ?Is she taking Amitiza 1 in the morning and one at night? That's the correct way to take it. She can double her Miralax if needed. ?

## 2021-11-05 NOTE — Telephone Encounter (Signed)
Per MY chart. I have mailed the diet to her. Vanderbilt Ranieri  twill you please mail me the FODMAP diet that dr Jenetta Downer wants me follow  I had blood work done and I can have some leeway on what I can eat. Also mention to him that I don?t have a BM unless I take 2 Amzita, doubling upon my Mirilax. Thank you. ?

## 2021-11-05 NOTE — Telephone Encounter (Signed)
Ok. Thank you for the update

## 2021-11-08 ENCOUNTER — Telehealth: Payer: Self-pay | Admitting: Internal Medicine

## 2021-11-08 NOTE — Telephone Encounter (Signed)
Error

## 2021-11-09 ENCOUNTER — Other Ambulatory Visit: Payer: Self-pay | Admitting: *Deleted

## 2021-11-09 ENCOUNTER — Encounter: Payer: Self-pay | Admitting: Internal Medicine

## 2021-11-09 ENCOUNTER — Ambulatory Visit (INDEPENDENT_AMBULATORY_CARE_PROVIDER_SITE_OTHER): Payer: PPO | Admitting: Internal Medicine

## 2021-11-09 ENCOUNTER — Other Ambulatory Visit: Payer: Self-pay | Admitting: Orthopedic Surgery

## 2021-11-09 VITALS — BP 122/68 | HR 75 | Resp 18 | Ht 63.0 in | Wt 140.0 lb

## 2021-11-09 DIAGNOSIS — J029 Acute pharyngitis, unspecified: Secondary | ICD-10-CM

## 2021-11-09 MED ORDER — LIDOCAINE VISCOUS HCL 2 % MT SOLN: 5.0000 mL | Freq: Three times a day (TID) | OROMUCOSAL | 0 refills | Status: DC | PRN

## 2021-11-09 NOTE — Progress Notes (Signed)
? ?Acute Office Visit ? ?Subjective:  ? ? Patient ID: [name], female    DOB: 1961/01/20, 61 y.o.   MRN: [id number] ? ?Chief Complaint  ?Patient presents with  ? Sore Throat  ?  Pt has sore throat since 11-06-21 hurts to swallow no cough congestion or fever   ? ? ?HPI ?Patient is in today for c/o sore throat for the last 3 days. She has pain with swallowing as well.  She denies any fever, chills.  She has chronic nasal congestion/sinusitis, for which she takes Claritin and uses Flonase.  She denies any dyspnea or wheezing currently.  She has tried doing salt water gargling with some relief. ? ?Past Medical History:  ?Diagnosis Date  ? Allergy   ? grass, dust , mold  ? Anxiety   ? Arthritis   ? Asthma due to seasonal allergies 06/15/2020  ? Bipolar disorder ([place])   ? Carpal tunnel syndrome   ? Bilateral  ? Chest pain 09/2011  ? Cardiac cath-normal coronaries  ? Constipation   ? Depression   ? Difficulty urinating 05/31/2013  ? Elevated LFTs 12/16/2013  ? GERD (gastroesophageal reflux disease)   ? History of kidney stones   ? Hyperlipemia   ? Hyperlipidemia   ? Hypertension   ? Mild; provoked by stress and anxiety  ? IBS (irritable bowel syndrome)   ? Intracerebral bleed ([place])   ? No aneurysm; followed by Dr. Sherwood Gambler  ? Lens replaced   ? "lense transplant" 2022; pt states lense don't dilate or constrict  ? Loss of weight 01/06/2015  ? Osteoporosis   ? Stage 3a chronic kidney disease ([place]) 03/16/2021  ? Stroke [facility]) 1999  ? hemorrhagic stroke; weakness of left side  ? ? ?Past Surgical History:  ?Procedure Laterality Date  ? ABDOMINAL HYSTERECTOMY    ? "cancer cells"  ? BIOPSY  04/16/2021  ? Procedure: BIOPSY;  Surgeon: Montez Morita, Quillian Quince, MD;  [facility];  Service: Gastroenterology;;  small, bowel, esophageal(proximal and distal);  ? [place]  ? to remove blood clot after stroke   ? CARDIAC CATHETERIZATION  2016  ? CERVICAL FUSION    ? CHOLECYSTECTOMY N/A 10/14/2014  ? Procedure:  LAPAROSCOPIC CHOLECYSTECTOMY WITH INTRAOPERATIVE CHOLANGIOGRAM;  Surgeon: Jackolyn Confer, MD;  Location: [place];  Service: General;  Laterality: N/A;  ? CHONDROPLASTY Right 07/13/2017  ? Procedure: CHONDROPLASTY of patella;  Surgeon: Carole Civil, MD;  Location: [facility];  Service: Orthopedics;  Laterality: Right;  ? ESOPHAGEAL DILATION N/A 04/16/2021  ? Procedure: ESOPHAGEAL DILATION;  Surgeon: Montez Morita, Quillian Quince, MD;  [facility];  Service: Gastroenterology;  Laterality: N/A;  ? ESOPHAGOGASTRODUODENOSCOPY (EGD) WITH PROPOFOL N/A 04/16/2021  ? Procedure: ESOPHAGOGASTRODUODENOSCOPY (EGD) WITH PROPOFOL;  Surgeon: Harvel Quale, MD;  [facility];  Service: Gastroenterology;  Laterality: N/A;  1:35, pt knows to arrive at 9:45  ? EUS N/A 08/21/2015  ? Procedure: ESOPHAGEAL ENDOSCOPIC ULTRASOUND (EUS) RADIAL;  Surgeon: Carol Ada, MD;  [facility];  Service: Endoscopy;  Laterality: N/A;  ? KNEE ARTHROSCOPY WITH MEDIAL MENISECTOMY Right 07/13/2017  ? Procedure: KNEE ARTHROSCOPY WITH PARTIAL MEDIAL MENISECTOMY;  Surgeon: Carole Civil, MD;  [facility];  Service: Orthopedics;  Laterality: Right;  ? LEFT HEART CATHETERIZATION WITH CORONARY ANGIOGRAM N/A 09/23/2011  ? Procedure: LEFT HEART CATHETERIZATION WITH CORONARY ANGIOGRAM;  Surgeon: Thayer Headings, MD;  Location: [facility] CATH LAB;  Service: Cardiovascular;  Laterality: N/A;  ? LIPOMA EXCISION  Left 11/18/2013  ? Procedure: EXCISION OF SOFT TISSUE MASS-LEFT THIGH;  Surgeon: Jamesetta So, MD;  [facility];  Service: General;  Laterality: Left;  ? NASAL SEPTOPLASTY W/ TURBINOPLASTY Bilateral 08/30/2021  ? Procedure: NASAL SEPTOPLASTY WITH BILATERAL TURBINATE REDUCTION;  Surgeon: Leta Baptist, MD;  Location: [place];  Service: ENT;  Laterality: Bilateral;  ? POLYPECTOMY  04/16/2021  ? Procedure: POLYPECTOMY;  Surgeon: Harvel Quale, MD;  [facility];  Service:  Gastroenterology;;  gastric  ? RECTOCELE REPAIR    ? x2  ? RECTOCELE REPAIR N/A 04/04/2017  ? Procedure: POSTERIOR REPAIR (RECTOCELE);  Surgeon: Jonnie Kind, MD;  Location: [facility];  Service: Gynecology;  Laterality: N/A;  ? ? ?Family History  ?Problem Relation Age of Onset  ? Cancer Mother   ?     breast   ? Hypertension Mother   ? Hyperlipidemia Mother   ? Depression Mother   ? Anxiety disorder Mother   ? COPD Mother   ? Arthritis Mother   ?     rheumatoid  ? Drug abuse Sister   ? Coronary artery disease Paternal Grandfather   ? Coronary artery disease Paternal Uncle   ? Depression Cousin   ? Drug abuse Cousin   ? ? ?Social History  ? ?Socioeconomic History  ? Marital status: Married  ?  Spouse name: Sonia Side  ? Number of children: 0  ? Years of education: HS  ? Highest education level: Not on file  ?Occupational History  ? Occupation: unemployed  ?  Comment: pending disability  ?Tobacco Use  ? Smoking status: Former  ?  Packs/day: 1.00  ?  Years: 19.00  ?  Pack years: 19.00  ?  Types: Cigarettes  ?  Quit date: 09/01/1997  ?  Years since quitting: 24.2  ? Smokeless tobacco: Never  ? Tobacco comments:  ?  Quit smoking 1999 , previous 20 pack years  ?Vaping Use  ? Vaping Use: Never used  ?Substance and Sexual Activity  ? Alcohol use: Yes  ?  Comment: 1 drink every other week  ? Drug use: No  ? Sexual activity: Not Currently  ?  Partners: Male  ?  Birth control/protection: Surgical  ?  Comment: hyst   ?Other Topics Concern  ? Not on file  ?Social History Narrative  ? Currently unable to work  ? Lives in [place]  ? Married  ? Patient drinks 1 cup of caffeine daily.  ? Patient is right handed.   ? Joined the [employer] to get more exercise  ? ?Social Determinants of Health  ? ?Financial Resource Strain: Low Risk   ? Difficulty of Paying Living Expenses: Not hard at all  ?Food Insecurity: No Food Insecurity  ? Worried About Charity fundraiser in the Last Year: Never true  ? Ran Out of Food in the Last Year: Never true   ?Transportation Needs: No Transportation Needs  ? Lack of Transportation (Medical): No  ? Lack of Transportation (Non-Medical): No  ?Physical Activity: Inactive  ? Days of Exercise per Week: 0 days  ? Minutes of Exercise per Session: 0 min  ?Stress: No Stress Concern Present  ? Feeling of Stress : Not at all  ?Social Connections: Moderately Isolated  ? Frequency of Communication with Friends and Family: More than three times a week  ? Frequency of Social Gatherings with Friends and Family: More than three times a week  ? Attends Religious Services: Never  ?  Active Member of Clubs or Organizations: No  ? Attends Archivist Meetings: Never  ? Marital Status: Married  ?Intimate Partner Violence: Not At Risk  ? Fear of Current or Ex-Partner: No  ? Emotionally Abused: No  ? Physically Abused: No  ? Sexually Abused: No  ? ? ?Outpatient Medications Prior to Visit  ?Medication Sig Dispense Refill  ? acetaminophen (TYLENOL) 500 MG tablet Take 500-1,000 mg by mouth every 6 (six) hours as needed for moderate pain.    ? ALPRAZolam (XANAX) 1 MG tablet Take 1 tablet (1 mg total) by mouth 3 (three) times daily. 90 tablet 2  ? atorvastatin (LIPITOR) 40 MG tablet Take 1 tablet (40 mg total) by mouth daily. 90 tablet 3  ? azelastine (ASTELIN) 0.1 % nasal spray Place 1 spray in each nostril twice a day as needed for runny nose/drainage down the throat 30 mL 3  ? Calcium 500-125 MG-UNIT TABS Take 1 tablet by mouth daily with supper.    ? Carboxymethylcellulose Sod PF 0.5 % SOLN Place 1 drop into both eyes daily as needed (dry eyes).    ? cyclobenzaprine (FLEXERIL) 5 MG tablet Take 1 tablet (5 mg total) by mouth 3 (three) times daily as needed. for muscle spams 30 tablet 11  ? diclofenac (CATAFLAM) 50 MG tablet TAKE (1) TABLET BY MOUTH TWICE DAILY. 90 tablet 0  ? diclofenac Sodium (VOLTAREN) 1 % GEL Apply 1 application topically daily as needed (pain).    ? dicyclomine (BENTYL) 10 MG capsule TAKE ONE CAPSULE EVERY 12 HOURS AS  NEEDED 180 capsule 3  ? estradiol (ESTRACE) 0.1 MG/GM vaginal cream Place 0.5 g vaginally 2 (two) times a week. Place 0.5g nightly for two weeks then twice a week after 30 g 11  ? ezetimibe (ZETIA) 10 MG tablet Take 1

## 2021-11-10 ENCOUNTER — Ambulatory Visit: Payer: PPO | Admitting: Family Medicine

## 2021-11-11 ENCOUNTER — Encounter: Payer: Self-pay | Admitting: Orthopedic Surgery

## 2021-11-11 ENCOUNTER — Ambulatory Visit (INDEPENDENT_AMBULATORY_CARE_PROVIDER_SITE_OTHER): Payer: PPO | Admitting: *Deleted

## 2021-11-11 DIAGNOSIS — E782 Mixed hyperlipidemia: Secondary | ICD-10-CM

## 2021-11-11 DIAGNOSIS — I10 Essential (primary) hypertension: Secondary | ICD-10-CM

## 2021-11-11 NOTE — Patient Instructions (Signed)
Visit Information  ? ?Thank you for taking time to visit with me today. Please don't hesitate to contact me if I can be of assistance to you before our next scheduled telephone appointment. ? ?Following are the goals we discussed today:  ?Take medications as prescribed   ?Attend all scheduled provider appointments ?Call pharmacy for medication refills 3-7 days in advance of running out of medications ?Attend church or other social activities ?Perform all self care activities independently  ?Perform IADL's (shopping, preparing meals, housekeeping, managing finances) independently ?Call provider office for new concerns or questions  ?check blood pressure weekly ?choose a place to take my blood pressure (home, clinic or office, retail store) ?write blood pressure results in a log or diary ?take blood pressure log to all doctor appointments ?call doctor for signs and symptoms of high blood pressure ?keep all doctor appointments ?take medications for blood pressure exactly as prescribed ?begin an exercise program ?eat more whole grains, fruits and vegetables, lean meats and healthy fats ?take all medications exactly as prescribed ?call doctor with any symptoms you believe are related to your medicine ?call doctor when you experience any new symptoms ? ? ?Low-Sodium Eating Plan ?Sodium, which is an element that makes up salt, helps you maintain a healthy balance of fluids in your body. Too much sodium can increase your blood pressure and cause fluid and waste to be held in your body. ?Your health care provider or dietitian may recommend following this plan if you have high blood pressure (hypertension), kidney disease, liver disease, or heart failure. Eating less sodium can help lower your blood pressure, reduce swelling, and protect your heart, liver, and kidneys. ?What are tips for following this plan? ?Reading food labels ?The Nutrition Facts label lists the amount of sodium in one serving of the food. If you eat more  than one serving, you must multiply the listed amount of sodium by the number of servings. ?Choose foods with less than 140 mg of sodium per serving. ?Avoid foods with 300 mg of sodium or more per serving. ?Shopping ? ?Look for lower-sodium products, often labeled as "low-sodium" or "no salt added." ?Always check the sodium content, even if foods are labeled as "unsalted" or "no salt added." ?Buy fresh foods. ?Avoid canned foods and pre-made or frozen meals. ?Avoid canned, cured, or processed meats. ?Buy breads that have less than 80 mg of sodium per slice. ?Cooking ? ?Eat more home-cooked food and less restaurant, buffet, and fast food. ?Avoid adding salt when cooking. Use salt-free seasonings or herbs instead of table salt or sea salt. Check with your health care provider or pharmacist before using salt substitutes. ?Cook with plant-based oils, such as canola, sunflower, or olive oil. ?Meal planning ?When eating at a restaurant, ask that your food be prepared with less salt or no salt, if possible. Avoid dishes labeled as brined, pickled, cured, smoked, or made with soy sauce, miso, or teriyaki sauce. ?Avoid foods that contain MSG (monosodium glutamate). MSG is sometimes added to [place] food, bouillon, and some canned foods. ?Make meals that can be grilled, baked, poached, roasted, or steamed. These are generally made with less sodium. ?General information ?Most people on this plan should limit their sodium intake to 1,500-2,000 mg (milligrams) of sodium each day. ?What foods should I eat? ?Fruits ?Fresh, frozen, or canned fruit. Fruit juice. ?Vegetables ?Fresh or frozen vegetables. "No salt added" canned vegetables. "No salt added" tomato sauce and paste. Low-sodium or reduced-sodium tomato and vegetable juice. ?Grains ?Low-sodium cereals,  including oats, puffed wheat and rice, and shredded wheat. Low-sodium crackers. Unsalted rice. Unsalted pasta. Low-sodium bread. Whole-grain breads and whole-grain  pasta. ?Meats and other proteins ?Fresh or frozen (no salt added) meat, poultry, seafood, and fish. Low-sodium canned tuna and salmon. Unsalted nuts. Dried peas, beans, and lentils without added salt. Unsalted canned beans. Eggs. Unsalted nut butters. ?Dairy ?Milk. Soy milk. Cheese that is naturally low in sodium, such as ricotta cheese, fresh mozzarella, or Swiss cheese. Low-sodium or reduced-sodium cheese. Cream cheese. Yogurt. ?Seasonings and condiments ?Fresh and dried herbs and spices. Salt-free seasonings. Low-sodium mustard and ketchup. Sodium-free salad dressing. Sodium-free light mayonnaise. Fresh or refrigerated horseradish. Lemon juice. Vinegar. ?Other foods ?Homemade, reduced-sodium, or low-sodium soups. Unsalted popcorn and pretzels. Low-salt or salt-free chips. ?The items listed above may not be a complete list of foods and beverages you can eat. Contact a dietitian for more information. ?What foods should I avoid? ?Vegetables ?Sauerkraut, pickled vegetables, and relishes. Olives. [place] fries. Onion rings. Regular canned vegetables (not low-sodium or reduced-sodium). Regular canned tomato sauce and paste (not low-sodium or reduced-sodium). Regular tomato and vegetable juice (not low-sodium or reduced-sodium). Frozen vegetables in sauces. ?Grains ?Instant hot cereals. Bread stuffing, pancake, and biscuit mixes. Croutons. Seasoned rice or pasta mixes. Noodle soup cups. Boxed or frozen macaroni and cheese. Regular salted crackers. Self-rising flour. ?Meats and other proteins ?Meat or fish that is salted, canned, smoked, spiced, or pickled. Precooked or cured meat, such as sausages or meat loaves. Berniece Salines. Ham. Pepperoni. Hot dogs. Corned beef. Chipped beef. Salt pork. Jerky. Pickled herring. Anchovies and sardines. Regular canned tuna. Salted nuts. ?Dairy ?Processed cheese and cheese spreads. Hard cheeses. Cheese curds. Blue cheese. Feta cheese. String cheese. Regular cottage cheese. Buttermilk. Canned  milk. ?Fats and oils ?Salted butter. Regular margarine. Ghee. Bacon fat. ?Seasonings and condiments ?Onion salt, garlic salt, seasoned salt, table salt, and sea salt. Canned and packaged gravies. Worcestershire sauce. Tartar sauce. Barbecue sauce. Teriyaki sauce. Soy sauce, including reduced-sodium. Steak sauce. Fish sauce. Oyster sauce. Cocktail sauce. Horseradish that you find on the shelf. Regular ketchup and mustard. Meat flavorings and tenderizers. Bouillon cubes. Hot sauce. Pre-made or packaged marinades. Pre-made or packaged taco seasonings. Relishes. Regular salad dressings. Salsa. ?Other foods ?Salted popcorn and pretzels. Corn chips and puffs. Potato and tortilla chips. Canned or dried soups. Pizza. Frozen entrees and pot pies. ?The items listed above may not be a complete list of foods and beverages you should avoid. Contact a dietitian for more information. ?Summary ?Eating less sodium can help lower your blood pressure, reduce swelling, and protect your heart, liver, and kidneys. ?Most people on this plan should limit their sodium intake to 1,500-2,000 mg (milligrams) of sodium each day. ?Canned, boxed, and frozen foods are high in sodium. Restaurant foods, fast foods, and pizza are also very high in sodium. You also get sodium by adding salt to food. ?Try to cook at home, eat more fresh fruits and vegetables, and eat less fast food and canned, processed, or prepared foods. ?This information is not intended to replace advice given to you by your health care provider. Make sure you discuss any questions you have with your health care provider. ?Document Revised: 07/26/2019 Document Reviewed: 05/22/2019 ?Elsevier Patient Education ? [place]. ?Heart-Healthy Eating Plan ?Many factors influence your heart (coronary) health, including eating and exercise habits. Coronary risk increases with abnormal blood fat (lipid) levels. Heart-healthy meal planning includes limiting unhealthy fats, increasing  healthy fats, and making other diet and lifestyle changes. ?  What is my plan? ?Your health care provider may recommend that you: ?Limit your fat intake to _________% or less of your total calories each day. ?Limit you

## 2021-11-11 NOTE — Chronic Care Management (AMB) (Signed)
?Chronic Care Management  ? ?CCM RN Visit Note ? ?11/11/2021 ?Name: [name] MRN: [id number] DOB: Jan 05, 1961 ? ?Subjective: ?[name] is a 61 y.o. year old female who is a primary care patient of Lindell Spar, MD. The care management team was consulted for assistance with disease management and care coordination needs.   ? ?Engaged with patient by telephone for initial visit in response to provider referral for case management and/or care coordination services.  ? ?Consent to Services:  ?The patient was given the following information about Chronic Care Management services today, agreed to services, and gave verbal consent: 1. CCM service includes personalized support from designated clinical staff supervised by the primary care provider, including individualized plan of care and coordination with other care providers 2. 24/7 contact phone numbers for assistance for urgent and routine care needs. 3. Service will only be billed when office clinical staff spend 20 minutes or more in a month to coordinate care. 4. Only one practitioner may furnish and bill the service in a calendar month. 5.The patient may stop CCM services at any time (effective at the end of the month) by phone call to the office staff. 6. The patient will be responsible for cost sharing (co-pay) of up to 20% of the service fee (after annual deductible is met). Patient agreed to services and consent obtained. ? ?Patient agreed to services and verbal consent obtained.  ? ?Assessment: Review of patient past medical history, allergies, medications, health status, including review of consultants reports, laboratory and other test data, was performed as part of comprehensive evaluation and provision of chronic care management services.  ? ?SDOH (Social Determinants of Health) assessments and interventions performed:  ?SDOH Interventions   ? ?Flowsheet Row Most Recent Value  ?SDOH Interventions   ?Food Insecurity Interventions Intervention  Not Indicated  ?Transportation Interventions Intervention Not Indicated  ? ?  ?  ? ?[place] ? ?Allergies  ?Allergen Reactions  ? Morphine And Related Hives  ? Promethazine Hcl Other (See Comments)  ?  Causes patient to become Hyper  ? ? ?Outpatient Encounter Medications as of 11/11/2021  ?Medication Sig Note  ? acetaminophen (TYLENOL) 500 MG tablet Take 500-1,000 mg by mouth every 6 (six) hours as needed for moderate pain.   ? ALPRAZolam (XANAX) 1 MG tablet Take 1 tablet (1 mg total) by mouth 3 (three) times daily.   ? atorvastatin (LIPITOR) 40 MG tablet Take 1 tablet (40 mg total) by mouth daily.   ? azelastine (ASTELIN) 0.1 % nasal spray Place 1 spray in each nostril twice a day as needed for runny nose/drainage down the throat   ? Calcium 500-125 MG-UNIT TABS Take 1 tablet by mouth daily with supper.   ? Carboxymethylcellulose Sod PF 0.5 % SOLN Place 1 drop into both eyes daily as needed (dry eyes).   ? cyclobenzaprine (FLEXERIL) 5 MG tablet Take 1 tablet (5 mg total) by mouth 3 (three) times daily as needed. for muscle spams   ? diclofenac (CATAFLAM) 50 MG tablet TAKE (1) TABLET BY MOUTH TWICE DAILY.   ? diclofenac Sodium (VOLTAREN) 1 % GEL Apply 1 application topically daily as needed (pain).   ? dicyclomine (BENTYL) 10 MG capsule TAKE ONE CAPSULE EVERY 12 HOURS AS NEEDED   ? estradiol (ESTRACE) 0.1 MG/GM vaginal cream Place 0.5 g vaginally 2 (two) times a week. Place 0.5g nightly for two weeks then twice a week after   ? ezetimibe (ZETIA) 10 MG tablet Take 1  tablet (10 mg total) by mouth daily.   ? famotidine (PEPCID) 20 MG tablet Take 20 mg by mouth daily as needed.   ? fluticasone (FLONASE) 50 MCG/ACT nasal spray Place 1-2 sprays into both nostrils daily.   ? lidocaine-prilocaine (EMLA) cream Apply 1 application. topically as needed. Place on vagina. Wipe off excess after a few minutes   ? lisinopril (ZESTRIL) 20 MG tablet Take 1 tablet (20 mg total) by mouth daily.   ? loratadine (CLARITIN) 10 MG  tablet Take 10 mg by mouth daily.   ? magic mouthwash (lidocaine, diphenhydrAMINE, alum & mag hydroxide) suspension Swish and swallow 5 mLs 3 (three) times daily as needed for mouth pain.   ? methylphenidate (RITALIN) 20 MG tablet Take 1 tablet (20 mg total) by mouth 2 (two) times daily with breakfast and lunch.   ? montelukast (SINGULAIR) 10 MG tablet Take 1 tablet (10 mg total) by mouth at bedtime.   ? Multiple Vitamin (MULITIVITAMIN WITH MINERALS) TABS Take 1 tablet by mouth daily with breakfast.   ? nystatin (MYCOSTATIN/NYSTOP) powder Apply 1 application topically 3 (three) times daily. (Patient taking differently: Apply 1 application. topically 3 (three) times daily as needed.)   ? Olopatadine HCl 0.2 % SOLN Place 1 drop into both eyes in the morning and at bedtime.   ? Omega-3 Fatty Acids (FISH OIL) 1000 MG CAPS Take 2,000 mg by mouth.   ? omeprazole (PRILOSEC) 40 MG capsule Take 1 capsule (40 mg total) by mouth daily.   ? oxybutynin (DITROPAN-XL) 10 MG 24 hr tablet Take 1 tablet (10 mg total) by mouth at bedtime.   ? Probiotic Product (TRUBIOTICS PO) Take 1 capsule by mouth daily. Takes 25 cfu daily.   ? pyridOXINE (VITAMIN B-6) 100 MG tablet Take 100 mg by mouth daily.   ? traZODone (DESYREL) 100 MG tablet Take 2 tablets (200 mg total) by mouth at bedtime.   ? venlafaxine XR (EFFEXOR XR) 150 MG 24 hr capsule Take 1 capsule (150 mg total) by mouth daily with breakfast.   ? lubiprostone (AMITIZA) 24 MCG capsule Take 1 capsule (24 mcg total) by mouth 2 (two) times daily with a meal. (Patient not taking: Reported on 11/11/2021) 05/03/2021: EOD  ? ?No facility-administered encounter medications on file as of 11/11/2021.  ? ? ?Patient Active Problem List  ? Diagnosis Date Noted  ? Lumbar spondylosis 11/04/2021  ? Encounter for general adult medical examination with abnormal findings 07/07/2021  ? Gastric polyp 05/31/2021  ? NASH (nonalcoholic steatohepatitis) 04/01/2021  ? Dysphagia 04/01/2021  ? Stage 3a chronic  kidney disease ([place]) 03/16/2021  ? Breast pain 03/02/2021  ? Arthritis 01/31/2021  ? 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 History of hemorrhagic stroke with residual hemiplegia ([place]) 06/15/2020  ? Urinary incontinence 06/15/2020  ? Asthma due to seasonal allergies 06/15/2020  ? Vaginal itching 03/16/2020  ? S/P right knee arthroscopy 07/13/17 07/20/2017  ? Derangement of posterior horn of medial meniscus of right knee   ? Chondromalacia, patella, right   ? IBS (irritable bowel syndrome) 05/11/2015  ? Gait instability 01/06/2015  ? DDD (degenerative disc disease), lumbar 02/18/2014  ? Postmenopausal atrophic vaginitis 11/01/2013  ? Lipoma of abdominal wall 08/14/2013  ? Back pain 05/29/2013  ? Paresthesia of foot 02/12/2013  ? Hypertension   ? GERD (gastroesophageal reflux disease)   ? Depression 11/17/2011  ? Insomnia 11/17/2011  ? Mixed hyperlipidemia 09/20/2011  ? Anxiety 09/20/2011  ? ? ?Conditions to be addressed/monitored:HTN and HLD ? ?Care  Plan : RN Care Manager Plan of Care  ?Updates made by Kassie Mends, RN since 11/11/2021 12:00 AM  ?  ? ?Problem: No plan of care established for management of chronic disease state  (HTN, HLD)   ?Priority: High  ?  ? ?Long-Range Goal: Development of plan of care for chronic disease management  (HTN, HLD)   ?Start Date: 11/11/2021  ?Expected End Date: 05/10/2022  ?Priority: High  ?Note:   ?Current Barriers:  ?Knowledge Deficits related to plan of care for management of HTN and HLD  ?Chronic Disease Management support and education needs related to HTN and HLD ?Patient reports she lives with spouse, is independent in all aspects of her care, continues to drive, checks blood pressure on occasion, works in her yard and is thinking of joining the gym, tries to adhere to healthy diet and drinks mostly water. ?Pt has no advanced directives- has documents at home to complete. ? ?RNCM Clinical Goal(s):  ?Patient will verbalize understanding of plan for management of HTN and HLD as  evidenced by patient report, review of EHR and  through collaboration with RN Care manager, provider, and care team.  ? ?Interventions: ?1:1 collaboration with primary care provider regarding development and update

## 2021-11-19 ENCOUNTER — Encounter: Payer: Self-pay | Admitting: Radiology

## 2021-11-22 ENCOUNTER — Encounter (HOSPITAL_COMMUNITY): Payer: Self-pay | Admitting: Psychiatry

## 2021-11-22 ENCOUNTER — Encounter: Payer: Self-pay | Admitting: Orthopaedic Surgery

## 2021-11-22 ENCOUNTER — Encounter (HOSPITAL_COMMUNITY): Payer: Self-pay

## 2021-11-22 ENCOUNTER — Other Ambulatory Visit (HOSPITAL_COMMUNITY): Payer: Self-pay | Admitting: Psychiatry

## 2021-11-22 ENCOUNTER — Telehealth (INDEPENDENT_AMBULATORY_CARE_PROVIDER_SITE_OTHER): Payer: PPO | Admitting: Psychiatry

## 2021-11-22 DIAGNOSIS — F988 Other specified behavioral and emotional disorders with onset usually occurring in childhood and adolescence: Secondary | ICD-10-CM | POA: Diagnosis not present

## 2021-11-22 DIAGNOSIS — F332 Major depressive disorder, recurrent severe without psychotic features: Secondary | ICD-10-CM | POA: Diagnosis not present

## 2021-11-22 MED ORDER — METHYLPHENIDATE HCL 20 MG PO TABS: 20.0000 mg | ORAL_TABLET | Freq: Two times a day (BID) | ORAL | 0 refills | Status: DC

## 2021-11-22 MED ORDER — ALPRAZOLAM 1 MG PO TABS: 1.0000 mg | ORAL_TABLET | Freq: Three times a day (TID) | ORAL | 2 refills | Status: DC

## 2021-11-22 MED ORDER — TRAZODONE HCL 100 MG PO TABS: 200.0000 mg | ORAL_TABLET | Freq: Every day | ORAL | 2 refills | Status: DC

## 2021-11-22 MED ORDER — VENLAFAXINE HCL ER 150 MG PO CP24: 150.0000 mg | ORAL_CAPSULE | Freq: Every day | ORAL | 2 refills | Status: DC

## 2021-11-22 NOTE — Progress Notes (Signed)
Virtual Visit via Telephone Note  I connected with [name] on 11/22/21 at 10:20 AM EDT by telephone and verified that I am speaking with the correct person using two identifiers.  Location: Patient: home Provider: office   I discussed the limitations, risks, security and privacy concerns of performing an evaluation and management service by telephone and the availability of in person appointments. I also discussed with the patient that there may be a patient responsible charge related to this service. The patient expressed understanding and agreed to proceed.      I discussed the assessment and treatment plan with the patient. The patient was provided an opportunity to ask questions and all were answered. The patient agreed with the plan and demonstrated an understanding of the instructions.   The patient was advised to call back or seek an in-person evaluation if the symptoms worsen or if the condition fails to improve as anticipated.  I provided 20 minutes of non-face-to-face time during this encounter.   [name], MD  [facility] MD/PA/NP OP Progress Note  11/22/2021 11:10 AM [name]  MRN:  [id number]  Chief Complaint:  Chief Complaint  Patient presents with   Anxiety   Depression   ADD   Follow-up   HPI: T his patient is a 61 year old married white female who lives with her husband in [place]. She has no children. 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  The patient returns for follow-up after 4 weeks regarding her depression, anxiety and troubles with focus.  She still having some problems getting the methylphenidate but was able to get it last time.  It does help her stay more on track and gives her a boost of energy.  Right now she is frustrated because her back hurts so badly nothing really seems to help.  She has tried acupuncture but its not doing much for her in fact she thinks it is making things  worse.  She has been given a referral to pain management but has not been able to get in yet.  She states her back hurts so bad in the mornings is hard for her to get going and she really misses working out in her garden.  She is frustrated with the symptoms but denies thoughts of self-harm or suicidal ideation. Visit Diagnosis:    ICD-10-CM   1. Major depressive disorder, recurrent, severe without psychotic features ([place])  F33.2     2. ADD (attention deficit disorder) without hyperactivity  F98.8       Past Psychiatric History: Past outpatient treatment for depression  Past Medical History:  Past Medical History:  Diagnosis Date   Allergy    grass, dust , mold   Anxiety    Arthritis    Asthma due to seasonal allergies 06/15/2020   Bipolar disorder ([place])    Carpal tunnel syndrome    Bilateral   Chest pain 09/2011   Cardiac cath-normal coronaries   Constipation    Depression    Difficulty urinating 05/31/2013   Elevated LFTs 12/16/2013   GERD (gastroesophageal reflux disease)    History of kidney stones    Hyperlipemia    Hyperlipidemia    Hypertension    Mild; provoked by stress and anxiety   IBS (irritable bowel syndrome)    Intracerebral bleed ([place])    No aneurysm; followed by Dr. Carolan Shiver replaced    "lense transplant" 2022; pt states  lense don't dilate or constrict   Loss of weight 01/06/2015   Osteoporosis    Stage 3a chronic kidney disease ([place]) 03/16/2021   Stroke ([place]) 1999   hemorrhagic stroke; weakness of left side    Past Surgical History:  Procedure Laterality Date   ABDOMINAL HYSTERECTOMY     "cancer cells"   BIOPSY  04/16/2021   Procedure: BIOPSY;  Surgeon: Montez Morita, Quillian Quince, MD;  [facility];  Service: Gastroenterology;;  small, bowel, esophageal(proximal and distal);   BRAIN SURGERY  1999   to remove blood clot after stroke    CARDIAC CATHETERIZATION  2016   CERVICAL FUSION     CHOLECYSTECTOMY N/A 10/14/2014    Procedure: LAPAROSCOPIC CHOLECYSTECTOMY WITH INTRAOPERATIVE CHOLANGIOGRAM;  Surgeon: Jackolyn Confer, MD;  Location: [facility];  Service: General;  Laterality: N/A;   CHONDROPLASTY Right 07/13/2017   Procedure: CHONDROPLASTY of patella;  Surgeon: Carole Civil, MD;  Location: [facility];  Service: Orthopedics;  Laterality: Right;   ESOPHAGEAL DILATION N/A 04/16/2021   Procedure: ESOPHAGEAL DILATION;  Surgeon: Harvel Quale, MD;  [facility];  Service: Gastroenterology;  Laterality: N/A;   ESOPHAGOGASTRODUODENOSCOPY (EGD) WITH PROPOFOL N/A 04/16/2021   Procedure: ESOPHAGOGASTRODUODENOSCOPY (EGD) WITH PROPOFOL;  Surgeon: Harvel Quale, MD;  [facility];  Service: Gastroenterology;  Laterality: N/A;  1:35, pt knows to arrive at 9:45   EUS N/A 08/21/2015   Procedure: ESOPHAGEAL ENDOSCOPIC ULTRASOUND (EUS) RADIAL;  Surgeon: Carol Ada, MD;  [facility];  Service: Endoscopy;  Laterality: N/A;   KNEE ARTHROSCOPY WITH MEDIAL MENISECTOMY Right 07/13/2017   Procedure: KNEE ARTHROSCOPY WITH PARTIAL MEDIAL MENISECTOMY;  Surgeon: Carole Civil, MD;  Location: [facility];  Service: Orthopedics;  Laterality: Right;   LEFT HEART CATHETERIZATION WITH CORONARY ANGIOGRAM N/A 09/23/2011   Procedure: LEFT HEART CATHETERIZATION WITH CORONARY ANGIOGRAM;  Surgeon: Thayer Headings, MD;  Location: [facility];  Service: Cardiovascular;  Laterality: N/A;   LIPOMA EXCISION Left 11/18/2013   Procedure: EXCISION OF SOFT TISSUE MASS-LEFT THIGH;  Surgeon: Jamesetta So, MD;  Location[facility];  Service: General;  Laterality: Left;   NASAL SEPTOPLASTY W/ TURBINOPLASTY Bilateral 08/30/2021   Procedure: NASAL SEPTOPLASTY WITH BILATERAL TURBINATE REDUCTION;  Surgeon: Leta Baptist, MD;  Location: [facility];  Service: ENT;  Laterality: Bilateral;   POLYPECTOMY  04/16/2021   Procedure: POLYPECTOMY;  Surgeon: Harvel Quale, MD;  [facility];   Service: Gastroenterology;;  gastric   RECTOCELE REPAIR     x2   RECTOCELE REPAIR N/A 04/04/2017   Procedure: POSTERIOR REPAIR (RECTOCELE);  Surgeon: Jonnie Kind, MD;  Location[facility];  Service: Gynecology;  Laterality: N/A;    Family Psychiatric History: see below  Family History:  Family History  Problem Relation Age of Onset   Cancer Mother        breast    Hypertension Mother    Hyperlipidemia Mother    Depression Mother    Anxiety disorder Mother    COPD Mother    Arthritis Mother        rheumatoid   Drug abuse Sister    Coronary artery disease Paternal Grandfather    Coronary artery disease Paternal Uncle    Depression Cousin    Drug abuse Cousin     Social History:  Social History   Socioeconomic History   Marital status: Married    Spouse name: Sonia Side   Number of children: 0   Years of  education: HS   Highest education level: Not on file  Occupational History   Occupation: unemployed    Comment: pending disability  Tobacco Use   Smoking status: Former    Packs/day: 1.00    Years: 19.00    Pack years: 19.00    Types: Cigarettes    Quit date: 09/01/1997    Years since quitting: 24.2   Smokeless tobacco: Never   Tobacco comments:    Quit smoking 1999 , previous 20 pack years  Vaping Use   Vaping Use: Never used  Substance and Sexual Activity   Alcohol use: Yes    Comment: 1 drink every other week   Drug use: No   Sexual activity: Not Currently    Partners: Male    Birth control/protection: Surgical    Comment: hyst   Other Topics Concern   Not on file  Social History Narrative   Currently unable to work   Lives in [place]   Married   Patient drinks 1 cup of caffeine daily.   Patient is right handed.    Joined the [employer] to get more exercise   Social Determinants of Health   Financial Resource Strain: Low Risk    Difficulty of Paying Living Expenses: Not hard at all  Food Insecurity: No Food Insecurity   Worried About Charity fundraiser  in the Last Year: Never true   Arboriculturist in the Last Year: Never true  Transportation Needs: No Transportation Needs   Lack of Transportation (Medical): No   Lack of Transportation (Non-Medical): No  Physical Activity: Inactive   Days of Exercise per Week: 0 days   Minutes of Exercise per Session: 0 min  Stress: No Stress Concern Present   Feeling of Stress : Not at all  Social Connections: Moderately Isolated   Frequency of Communication with Friends and Family: More than three times a week   Frequency of Social Gatherings with Friends and Family: More than three times a week   Attends Religious Services: Never   Marine scientist or Organizations: No   Attends Archivist Meetings: Never   Marital Status: Married    Allergies:  Allergies  Allergen Reactions   Morphine And Related Hives   Promethazine Hcl Other (See Comments)    Causes patient to become Hyper    Metabolic Disorder Labs: Lab Results  Component Value Date   HGBA1C 5.5 11/01/2021   MPG 117 (H) 01/06/2015   MPG 117 (H) 02/17/2014   No results found for: PROLACTIN Lab Results  Component Value Date   CHOL 189 05/12/2021   TRIG 177 (H) 05/12/2021   HDL 61 05/12/2021   CHOLHDL 3.1 05/12/2021   VLDL 26 09/16/2015   LDLCALC 98 05/12/2021   LDLCALC 105 (H) 08/31/2020   Lab Results  Component Value Date   TSH 2.430 05/12/2021   TSH 1.770 08/31/2020    Therapeutic Level Labs: No results found for: LITHIUM No results found for: VALPROATE No components found for:  CBMZ  Current Medications: Current Outpatient Medications  Medication Sig Dispense Refill   acetaminophen (TYLENOL) 500 MG tablet Take 500-1,000 mg by mouth every 6 (six) hours as needed for moderate pain.     ALPRAZolam (XANAX) 1 MG tablet Take 1 tablet (1 mg total) by mouth 3 (three) times daily. 90 tablet 2   atorvastatin (LIPITOR) 40 MG tablet Take 1 tablet (40 mg total) by mouth daily. 90 tablet 3   azelastine  (  ASTELIN) 0.1 % nasal spray Place 1 spray in each nostril twice a day as needed for runny nose/drainage down the throat 30 mL 3   Calcium 500-125 MG-UNIT TABS Take 1 tablet by mouth daily with supper.     Carboxymethylcellulose Sod PF 0.5 % SOLN Place 1 drop into both eyes daily as needed (dry eyes).     cyclobenzaprine (FLEXERIL) 5 MG tablet Take 1 tablet (5 mg total) by mouth 3 (three) times daily as needed. for muscle spams 30 tablet 11   diclofenac (CATAFLAM) 50 MG tablet TAKE (1) TABLET BY MOUTH TWICE DAILY. 90 tablet 0   diclofenac Sodium (VOLTAREN) 1 % GEL Apply 1 application topically daily as needed (pain).     dicyclomine (BENTYL) 10 MG capsule TAKE ONE CAPSULE EVERY 12 HOURS AS NEEDED 180 capsule 3   estradiol (ESTRACE) 0.1 MG/GM vaginal cream Place 0.5 g vaginally 2 (two) times a week. Place 0.5g nightly for two weeks then twice a week after 30 g 11   ezetimibe (ZETIA) 10 MG tablet Take 1 tablet (10 mg total) by mouth daily. 90 tablet 3   famotidine (PEPCID) 20 MG tablet Take 20 mg by mouth daily as needed.     fluticasone (FLONASE) 50 MCG/ACT nasal spray Place 1-2 sprays into both nostrils daily. 16 g 5   lidocaine-prilocaine (EMLA) cream Apply 1 application. topically as needed. Place on vagina. Wipe off excess after a few minutes 30 g 5   lisinopril (ZESTRIL) 20 MG tablet Take 1 tablet (20 mg total) by mouth daily. 90 tablet 1   loratadine (CLARITIN) 10 MG tablet Take 10 mg by mouth daily.     lubiprostone (AMITIZA) 24 MCG capsule Take 1 capsule (24 mcg total) by mouth 2 (two) times daily with a meal. (Patient not taking: Reported on 11/11/2021) 180 capsule 1   magic mouthwash (lidocaine, diphenhydrAMINE, alum & mag hydroxide) suspension Swish and swallow 5 mLs 3 (three) times daily as needed for mouth pain. 360 mL 0   methylphenidate (RITALIN) 20 MG tablet Take 1 tablet (20 mg total) by mouth 2 (two) times daily with breakfast and lunch. 60 tablet 0   montelukast (SINGULAIR) 10 MG  tablet Take 1 tablet (10 mg total) by mouth at bedtime. 90 tablet 1   Multiple Vitamin (MULITIVITAMIN WITH MINERALS) TABS Take 1 tablet by mouth daily with breakfast.     nystatin (MYCOSTATIN/NYSTOP) powder Apply 1 application topically 3 (three) times daily. (Patient taking differently: Apply 1 application. topically 3 (three) times daily as needed.) 15 g 2   Olopatadine HCl 0.2 % SOLN Place 1 drop into both eyes in the morning and at bedtime.     Omega-3 Fatty Acids (FISH OIL) 1000 MG CAPS Take 2,000 mg by mouth.     omeprazole (PRILOSEC) 40 MG capsule Take 1 capsule (40 mg total) by mouth daily. 30 capsule 5   oxybutynin (DITROPAN-XL) 10 MG 24 hr tablet Take 1 tablet (10 mg total) by mouth at bedtime. 90 tablet 3   Probiotic Product (TRUBIOTICS PO) Take 1 capsule by mouth daily. Takes 25 cfu daily.     pyridOXINE (VITAMIN B-6) 100 MG tablet Take 100 mg by mouth daily.     traZODone (DESYREL) 100 MG tablet Take 2 tablets (200 mg total) by mouth at bedtime. 60 tablet 2   venlafaxine XR (EFFEXOR XR) 150 MG 24 hr capsule Take 1 capsule (150 mg total) by mouth daily with breakfast. 90 capsule 2   No current  facility-administered medications for this visit.     Musculoskeletal: Strength & Muscle Tone: na Gait & Station: na Patient leans: N/A  Psychiatric Specialty Exam: Review of Systems  Constitutional:  Positive for fatigue.  Gastrointestinal:  Positive for constipation and diarrhea.  Musculoskeletal:  Positive for arthralgias, back pain and myalgias.  Psychiatric/Behavioral:  Positive for dysphoric mood.   All other systems reviewed and are negative.  There were no vitals taken for this visit.There is no height or weight on file to calculate BMI.  General Appearance: NA  Eye Contact:  NA  Speech:  Clear and Coherent  Volume:  Normal  Mood:  Anxious  Affect:  NA  Thought Process:  Goal Directed  Orientation:  Full (Time, Place, and Person)  Thought Content: WDL   Suicidal  Thoughts:  No  Homicidal Thoughts:  No  Memory:  Immediate;   Good Recent;   Fair Remote;   Fair  Judgement:  Good  Insight:  Fair  Psychomotor Activity:  Decreased  Concentration:  Concentration: Fair and Attention Span: Fair  Recall:  [employer] of Knowledge: Good  Language: Good  Akathisia:  No  Handed:  Right  AIMS (if indicated): not done  Assets:  Communication Skills Desire for Improvement Resilience Social Support Talents/Skills  ADL's:  Intact  Cognition: WNL  Sleep:  Good   Screenings: AUDIT    Flowsheet Row Clinical Support from 01/06/2021 in [facility]  Alcohol Use Disorder Identification Test Final Score (AUDIT) 4      MDI    Flowsheet Row Office Visit from 01/22/2016 in [facility]  Total Score (max 50) 34      Mini-Mental    [name] Office Visit from 02/13/2015 in [facility]  Total Score (max 30 points ) 26      PHQ2-9    Flowsheet Row Video Visit from 11/22/2021 in [facility] from 11/11/2021 in [facility] Office Visit from 11/09/2021 in [facility] Office Visit from 11/04/2021 in [facility] Video Visit from 10/22/2021 in [facility]  PHQ-2 Total Score 2 0 0 3 2  PHQ-9 Total Score 9 -- 0 3 9      SBQ-R    Flowsheet Row Office Visit from 01/22/2016 in [facility]  SBQ-R Total Score 14.1      Flowsheet Row Video Visit from 11/22/2021 in [facility] Video Visit from 10/22/2021 in [facility] Video Visit from 07/27/2021 in [facility] Error: Q3, 4, or 5 should not be populated when Q2 is No Error: Q3, 4, or 5 should not be populated when Q2  is No No Risk        Assessment and Plan: This patient is a 61 year old female with a history of depression anxiety and problems with focus memory and energy.  She does better when she can get the methylphenidate 20 mg twice daily for focus and alertness.  Because it is on a national shortage right now its been problematic at times.  We will continue with the best we can.  Her main focus right now seems to be on getting in with pain management so we will continue Effexor XR 150 mg daily for depression, trazodone 200 mg at bedtime for sleep and Xanax 1 mg 3 times daily for anxiety.  She will return to  see me in 3 months  Collaboration of Care: Collaboration of Care: Primary Care Provider AEB chart notes are shared with PCP on the epic system  Patient/Guardian was advised Release of Information must be obtained prior to any record release in order to collaborate their care with an outside provider. Patient/Guardian was advised if they have not already done so to contact the registration department to sign all necessary forms in order for [place] to release information regarding their care.   Consent: Patient/Guardian gives verbal consent for treatment and assignment of benefits for services provided during this visit. Patient/Guardian expressed understanding and agreed to proceed.    [name], MD 11/22/2021, 11:10 AM

## 2021-11-23 ENCOUNTER — Encounter: Payer: Self-pay | Admitting: Obstetrics and Gynecology

## 2021-11-24 ENCOUNTER — Other Ambulatory Visit: Payer: Self-pay | Admitting: *Deleted

## 2021-11-24 DIAGNOSIS — J029 Acute pharyngitis, unspecified: Secondary | ICD-10-CM

## 2021-11-24 DIAGNOSIS — R1319 Other dysphagia: Secondary | ICD-10-CM

## 2021-11-25 ENCOUNTER — Ambulatory Visit (INDEPENDENT_AMBULATORY_CARE_PROVIDER_SITE_OTHER): Payer: PPO

## 2021-11-25 ENCOUNTER — Encounter: Payer: Self-pay | Admitting: Orthopaedic Surgery

## 2021-11-25 ENCOUNTER — Ambulatory Visit: Payer: PPO | Admitting: Orthopaedic Surgery

## 2021-11-25 ENCOUNTER — Other Ambulatory Visit: Payer: Self-pay | Admitting: Obstetrics and Gynecology

## 2021-11-25 VITALS — Ht 63.0 in | Wt 140.0 lb

## 2021-11-25 DIAGNOSIS — M545 Low back pain, unspecified: Secondary | ICD-10-CM | POA: Diagnosis not present

## 2021-11-25 DIAGNOSIS — N941 Unspecified dyspareunia: Secondary | ICD-10-CM

## 2021-11-25 DIAGNOSIS — M542 Cervicalgia: Secondary | ICD-10-CM | POA: Diagnosis not present

## 2021-11-25 DIAGNOSIS — G8929 Other chronic pain: Secondary | ICD-10-CM

## 2021-11-25 DIAGNOSIS — M62838 Other muscle spasm: Secondary | ICD-10-CM
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
## 2021-11-26 ENCOUNTER — Encounter: Payer: Self-pay | Admitting: Orthopedic Surgery

## 2021-11-26 ENCOUNTER — Encounter: Payer: Self-pay | Admitting: Orthopaedic Surgery

## 2021-11-26 NOTE — Telephone Encounter (Signed)
[facility] patient

## 2021-11-30 ENCOUNTER — Encounter (INDEPENDENT_AMBULATORY_CARE_PROVIDER_SITE_OTHER): Payer: PPO

## 2021-11-30 DIAGNOSIS — R1031 Right lower quadrant pain: Secondary | ICD-10-CM | POA: Diagnosis not present

## 2021-12-01 DIAGNOSIS — Z87891 Personal history of nicotine dependence: Secondary | ICD-10-CM | POA: Diagnosis not present

## 2021-12-01 DIAGNOSIS — I10 Essential (primary) hypertension: Secondary | ICD-10-CM

## 2021-12-01 DIAGNOSIS — E785 Hyperlipidemia, unspecified: Secondary | ICD-10-CM

## 2021-12-05 ENCOUNTER — Other Ambulatory Visit (INDEPENDENT_AMBULATORY_CARE_PROVIDER_SITE_OTHER): Payer: Self-pay | Admitting: Gastroenterology

## 2021-12-05 DIAGNOSIS — K582 Mixed irritable bowel syndrome: Secondary | ICD-10-CM

## 2021-12-06 ENCOUNTER — Ambulatory Visit: Payer: PPO | Admitting: Nutrition

## 2021-12-06 NOTE — Telephone Encounter (Signed)
Last seen by [facility] 09/21/21 and note states to continue amitiza. Has follow up in march 2024

## 2021-12-09 ENCOUNTER — Telehealth: Payer: PPO

## 2021-12-13 ENCOUNTER — Encounter: Payer: Self-pay | Admitting: Internal Medicine

## 2021-12-13 ENCOUNTER — Ambulatory Visit (HOSPITAL_COMMUNITY)
Admission: RE | Admit: 2021-12-13 | Discharge: 2021-12-13 | Disposition: A | Discharge status: Home / Self Care | Payer: PPO | Source: Ambulatory Visit | Attending: Orthopaedic Surgery | Admitting: Orthopaedic Surgery

## 2021-12-13 DIAGNOSIS — M545 Low back pain, unspecified: Secondary | ICD-10-CM | POA: Diagnosis not present

## 2021-12-13 DIAGNOSIS — G8929 Other chronic pain: Secondary | ICD-10-CM | POA: Diagnosis not present

## 2021-12-13 DIAGNOSIS — M47816 Spondylosis without myelopathy or radiculopathy, lumbar region: Secondary | ICD-10-CM | POA: Diagnosis not present

## 2021-12-13 DIAGNOSIS — M5126 Other intervertebral disc displacement, lumbar region: Secondary | ICD-10-CM | POA: Diagnosis not present

## 2021-12-14 ENCOUNTER — Telehealth (INDEPENDENT_AMBULATORY_CARE_PROVIDER_SITE_OTHER): Payer: Self-pay

## 2021-12-14 NOTE — Telephone Encounter (Signed)
[name] is scheduled for a ct on Friday 12/17/21 at 5:00 at [facility] and she is aware

## 2021-12-14 NOTE — Telephone Encounter (Signed)
hi Darius Bump , I have ordered a CT A/P with contrast on Ms. [name] for RLQ pain, just wanted to make you aware for scheduling, thanks!

## 2021-12-15 ENCOUNTER — Ambulatory Visit: Payer: PPO | Admitting: Orthopaedic Surgery

## 2021-12-16 ENCOUNTER — Ambulatory Visit: Payer: PPO | Admitting: Orthopaedic Surgery

## 2021-12-17 ENCOUNTER — Encounter (HOSPITAL_BASED_OUTPATIENT_CLINIC_OR_DEPARTMENT_OTHER): Payer: Self-pay

## 2021-12-17 ENCOUNTER — Ambulatory Visit (HOSPITAL_BASED_OUTPATIENT_CLINIC_OR_DEPARTMENT_OTHER)
Admission: RE | Admit: 2021-12-17 | Discharge: 2021-12-17 | Disposition: A | Discharge status: Home / Self Care | Payer: PPO | Source: Ambulatory Visit | Attending: Gastroenterology | Admitting: Gastroenterology

## 2021-12-17 DIAGNOSIS — R1031 Right lower quadrant pain: Secondary | ICD-10-CM | POA: Insufficient documentation

## 2021-12-17 DIAGNOSIS — K2289 Other specified disease of esophagus: Secondary | ICD-10-CM | POA: Diagnosis not present

## 2021-12-17 LAB — POCT I-STAT CREATININE: Creatinine, Ser: 1.1 mg/dL — ABNORMAL HIGH (ref 0.44–1.00)

## 2021-12-17 MED ORDER — IOHEXOL 300 MG/ML  SOLN
100.0000 mL | Freq: Once | INTRAMUSCULAR | Status: AC | PRN
  Administered 2021-12-17: 80 mL via INTRAVENOUS

## 2021-12-19 NOTE — Telephone Encounter (Addendum)
Please see the MyChart message reply(ies) for my assessment and plan.    This patient gave consent for this Medical Advice Message and is aware that it may result in a bill to [employer], as well as the possibility of receiving a bill for a co-payment or deductible. They are an established patient, but are not seeking medical advice exclusively about a problem treated during an in person or video visit in the last seven days. I did not recommend an in person or video visit within seven days of my reply.    I spent a total of 30 minutes cumulative time within 7 days through [employer].  Gabriel Rung, NP

## 2021-12-20 ENCOUNTER — Encounter (INDEPENDENT_AMBULATORY_CARE_PROVIDER_SITE_OTHER): Payer: Self-pay

## 2021-12-21 NOTE — Telephone Encounter (Signed)
LMTRC

## 2021-12-23 ENCOUNTER — Ambulatory Visit (INDEPENDENT_AMBULATORY_CARE_PROVIDER_SITE_OTHER): Payer: PPO | Admitting: Orthopaedic Surgery

## 2021-12-23 DIAGNOSIS — S32010S Wedge compression fracture of first lumbar vertebra, sequela: Secondary | ICD-10-CM | POA: Diagnosis not present

## 2021-12-23 DIAGNOSIS — S32000A Wedge compression fracture of unspecified lumbar vertebra, initial encounter for closed fracture: Secondary | ICD-10-CM | POA: Insufficient documentation

## 2021-12-24 NOTE — Telephone Encounter (Signed)
LMTRC

## 2021-12-27 ENCOUNTER — Encounter (INDEPENDENT_AMBULATORY_CARE_PROVIDER_SITE_OTHER): Payer: Self-pay | Admitting: Gastroenterology

## 2021-12-27 ENCOUNTER — Other Ambulatory Visit: Payer: Self-pay

## 2021-12-27 ENCOUNTER — Ambulatory Visit (INDEPENDENT_AMBULATORY_CARE_PROVIDER_SITE_OTHER): Payer: PPO | Admitting: Gastroenterology

## 2021-12-27 VITALS — BP 135/80 | HR 70 | Temp 97.6°F | Ht 63.0 in | Wt 139.7 lb

## 2021-12-27 DIAGNOSIS — G8929 Other chronic pain: Secondary | ICD-10-CM

## 2021-12-27 DIAGNOSIS — K581 Irritable bowel syndrome with constipation: Secondary | ICD-10-CM | POA: Diagnosis not present

## 2021-12-27 DIAGNOSIS — M5136 Other intervertebral disc degeneration, lumbar region: Secondary | ICD-10-CM

## 2021-12-27 DIAGNOSIS — S32010S Wedge compression fracture of first lumbar vertebra, sequela: Secondary | ICD-10-CM

## 2021-12-27 DIAGNOSIS — M51369 Other intervertebral disc degeneration, lumbar region without mention of lumbar back pain or lower extremity pain: Secondary | ICD-10-CM

## 2021-12-27 DIAGNOSIS — M545 Low back pain, unspecified: Secondary | ICD-10-CM

## 2021-12-27 MED ORDER — MOTEGRITY 1 MG PO TABS: 1.0000 | ORAL_TABLET | Freq: Two times a day (BID) | ORAL | 1 refills | Status: DC

## 2021-12-28 ENCOUNTER — Other Ambulatory Visit (HOSPITAL_COMMUNITY): Payer: Self-pay | Admitting: Internal Medicine

## 2021-12-28 DIAGNOSIS — Z1231 Encounter for screening mammogram for malignant neoplasm of breast: Secondary | ICD-10-CM

## 2021-12-30 ENCOUNTER — Other Ambulatory Visit: Payer: Self-pay | Admitting: *Deleted

## 2021-12-30 DIAGNOSIS — N1831 Chronic kidney disease, stage 3a: Secondary | ICD-10-CM

## 2022-01-03 ENCOUNTER — Encounter (HOSPITAL_COMMUNITY): Payer: Self-pay

## 2022-01-07 ENCOUNTER — Other Ambulatory Visit (HOSPITAL_COMMUNITY): Payer: Self-pay | Admitting: Psychiatry

## 2022-01-07 MED ORDER — METHYLPHENIDATE HCL 20 MG PO TABS: 20.0000 mg | ORAL_TABLET | Freq: Two times a day (BID) | ORAL | 0 refills | Status: DC

## 2022-01-07 NOTE — Telephone Encounter (Signed)
I sent in 3 prescriptions at your last appt for the next 3 months. But I sent one in again today. They should not be losing these

## 2022-01-12 ENCOUNTER — Encounter: Payer: Self-pay | Admitting: Internal Medicine

## 2022-01-16 ENCOUNTER — Other Ambulatory Visit: Payer: Self-pay | Admitting: Internal Medicine

## 2022-01-16 ENCOUNTER — Encounter (HOSPITAL_COMMUNITY): Payer: Self-pay | Admitting: Physical Therapy

## 2022-01-16 DIAGNOSIS — I10 Essential (primary) hypertension: Secondary | ICD-10-CM

## 2022-01-17 ENCOUNTER — Encounter: Payer: Self-pay | Admitting: Internal Medicine

## 2022-01-18 ENCOUNTER — Ambulatory Visit (HOSPITAL_COMMUNITY): Payer: PPO | Admitting: Physical Therapy

## 2022-01-18 NOTE — Telephone Encounter (Signed)
Per pt pharmacy patient picked up script on 01/12/22.

## 2022-01-25 ENCOUNTER — Encounter (INDEPENDENT_AMBULATORY_CARE_PROVIDER_SITE_OTHER): Payer: PPO | Admitting: Gastroenterology

## 2022-01-25 DIAGNOSIS — K582 Mixed irritable bowel syndrome: Secondary | ICD-10-CM

## 2022-01-25 NOTE — Patient Instructions (Incomplete)
Cough-stable Stopped azelastine nasal spray due to it causing a burning sensation  Seasonal and perennial allergic rhinitis(ragweed, indoor and outdoor molds, cat, dog) Continue Claritin once a day as needed for runny nose or itching Continue Singulair 10 mg once a day Continue Flonase 1 to 2 sprays each nostril once a day as needed for stuffy nose May use sinus rinse as needed for nasal symptoms.  Use this prior to any medicated nasal sprays May use saline gel for nasal dryness  Reflux Continue omeprazole once a day Continue Tums as needed  Please let us know if this treatment plan is not working well for you Schedule a follow-up appointment in  months sooner if needed

## 2022-01-26 ENCOUNTER — Encounter: Payer: Self-pay | Admitting: Family

## 2022-01-26 ENCOUNTER — Ambulatory Visit: Payer: PPO | Admitting: Family

## 2022-01-26 VITALS — BP 110/74 | HR 92 | Temp 98.0°F | Resp 16 | Ht 64.0 in | Wt 139.1 lb

## 2022-01-26 DIAGNOSIS — K219 Gastro-esophageal reflux disease without esophagitis: Secondary | ICD-10-CM | POA: Diagnosis not present

## 2022-01-26 DIAGNOSIS — J3089 Other allergic rhinitis: Secondary | ICD-10-CM | POA: Diagnosis not present

## 2022-01-26 DIAGNOSIS — J302 Other seasonal allergic rhinitis: Secondary | ICD-10-CM

## 2022-01-26 NOTE — Progress Notes (Signed)
[facility], [address] [place] [address] Dept: [phone]  FOLLOW UP NOTE  Patient ID: [name], female    DOB: August 09, 1960  Age: 61 y.o. MRN: [id number] Date of Office Visit: 01/26/2022  Assessment  Chief Complaint: Allergic Rhinitis  (Has felt wonderful since she went to see the ENT. )  HPI [name] is a 61 year old female who presents today for follow-up of seasonal and perennial allergic rhinitis, cough, and gastroesophageal reflux disease.  She was last seen on Nov 03, 2021 by myself.  She denies any new diagnosis or surgery since her last office visit.  She has joined a gym and is trying to get strength back in her legs and lose some weight.  Seasonal and perennial allergic rhinitis is reported as controlled with Claritin 10 mg once a day, Singulair 10 mg once a day, and saline rinse as needed.  She has not needed fluticasone nasal spray or saline gel.  She reports since having sinus surgery by Dr. Benjamine Mola that her allergies have been great.  She denies rhinorrhea, nasal congestion, and postnasal drip.  She has not had any sinus infections since we last saw her.  Reflux is reported as moderately controlled with omeprazole as needed.  She reports 1 episode of reflux in the past month.  Cough is reported as no longer being a problem.  She denies cough, wheeze, tightness in chest, shortness of breath, and nocturnal awakenings due to breathing problems.  She has not needed to use an inhaler.     Drug Allergies:  Allergies  Allergen Reactions   Morphine And Related Hives   Promethazine Hcl Other (See Comments)    Causes patient to become Hyper    Review of Systems: Review of Systems  Constitutional:  Negative for chills and fever.  HENT:         Denies rhinorrhea, nasal congestion, and postnasal drip  Eyes:        Denies itchy watery eyes  Respiratory:  Negative for cough, shortness of breath and wheezing.        Denies cough, wheeze, tightness in  chest, shortness of breath, and nocturnal awakenings due to breathing problems  Cardiovascular:  Negative for chest pain and palpitations.  Gastrointestinal:        Reports reflux symptoms once in the past month  Genitourinary:  Positive for frequency.       Reports increased frequency of urination.  Is currently on a medication for incontinence.  Skin:  Negative for itching and rash.  Neurological:  Positive for headaches.       Reports headaches once in a while for which she will take Coricidin  Endo/Heme/Allergies:  Positive for environmental allergies.     Physical Exam: BP 110/74   Pulse 92   Temp 98 F (36.7 C)   Resp 16   Ht 5' 4"  (1.626 m)   Wt 139 lb 2 oz (63.1 kg)   SpO2 96%   BMI 23.88 kg/m    Physical Exam Constitutional:      Appearance: Normal appearance.  HENT:     Head: Normocephalic and atraumatic.     Comments: Pharynx normal, eyes normal, ears normal, nose normal    Right Ear: Tympanic membrane, ear canal and external ear normal.     Left Ear: Tympanic membrane, ear canal and external ear normal.     Nose: Nose normal.     Mouth/Throat:     Mouth: Mucous membranes are moist.  Pharynx: Oropharynx is clear.  Eyes:     Conjunctiva/sclera: Conjunctivae normal.  Cardiovascular:     Rate and Rhythm: Regular rhythm.     Heart sounds: Normal heart sounds.  Pulmonary:     Effort: Pulmonary effort is normal.     Breath sounds: Normal breath sounds.     Comments: Lungs clear to auscultation Musculoskeletal:     Cervical back: Neck supple.  Skin:    General: Skin is warm.  Neurological:     Mental Status: [name] "[name]" is alert and oriented to person, place, and time.  Psychiatric:        Mood and Affect: Mood normal.        Behavior: Behavior normal.        Thought Content: Thought content normal.        Judgment: Judgment normal.     Diagnostics:  none  Assessment and Plan: 1. Seasonal and perennial allergic rhinitis   2.  Gastroesophageal reflux disease, unspecified whether esophagitis present     No orders of the defined types were placed in this encounter.   Patient Instructions  Cough-stable Reports no cough  Seasonal and perennial allergic rhinitis(ragweed, indoor and outdoor molds, cat, dog) Continue Claritin once a day as needed for runny nose or itching Continue Singulair 10 mg once a day Continue Flonase 1 to 2 sprays each nostril once a day as needed for stuffy nose May use sinus rinse as needed for nasal symptoms.  Use this prior to any medicated nasal sprays May use saline gel for nasal dryness  Reflux Continue omeprazole once a day as needed Continue Tums as needed  Please let us know if this treatment plan is not working well for you Schedule a follow-up appointment in 1 year sooner if needed Return in about 1 year (around 01/27/2023), or if symptoms worsen or fail to improve.    Thank you for the opportunity to care for this patient.  Please do not hesitate to contact me with questions.  Althea Charon, FNP [facility]

## 2022-02-01 DIAGNOSIS — L608 Other nail disorders: Secondary | ICD-10-CM | POA: Diagnosis not present

## 2022-02-01 DIAGNOSIS — L603 Nail dystrophy: Secondary | ICD-10-CM | POA: Diagnosis not present

## 2022-02-01 DIAGNOSIS — Z5189 Encounter for other specified aftercare: Secondary | ICD-10-CM | POA: Diagnosis not present

## 2022-02-02 NOTE — Telephone Encounter (Signed)
Please see the MyChart message reply(ies) for my assessment and plan.    This patient gave consent for this Medical Advice Message and is aware that it may result in a bill to [employer], as well as the possibility of receiving a bill for a co-payment or deductible. They are an established patient, but are not seeking medical advice exclusively about a problem treated during an in person or video visit in the last seven days. I did not recommend an in person or video visit within seven days of my reply.    I spent a total of 12 minutes cumulative time within 7 days through [employer].  Harvel Quale, MD

## 2022-02-02 NOTE — Telephone Encounter (Signed)
I called patient and she states she has been having this abdominal pain since she has been seeing you. I asked her how she was taking bentyl because it was prescribed one every 12 hours and she states it does help with pain. She states that was the way the med is prescribed but she thought you told her she could take more often if she needed to. Please advise.

## 2022-02-03 ENCOUNTER — Telehealth: Payer: PPO

## 2022-02-07 ENCOUNTER — Ambulatory Visit (INDEPENDENT_AMBULATORY_CARE_PROVIDER_SITE_OTHER): Payer: PPO | Admitting: Orthopedic Surgery

## 2022-02-07 ENCOUNTER — Ambulatory Visit (HOSPITAL_COMMUNITY): Payer: PPO | Attending: Orthopedic Surgery | Admitting: Physical Therapy

## 2022-02-07 ENCOUNTER — Encounter (HOSPITAL_COMMUNITY): Payer: Self-pay | Admitting: Physical Therapy

## 2022-02-07 ENCOUNTER — Other Ambulatory Visit: Payer: Self-pay | Admitting: Orthopedic Surgery

## 2022-02-07 DIAGNOSIS — M5136 Other intervertebral disc degeneration, lumbar region: Secondary | ICD-10-CM | POA: Diagnosis not present

## 2022-02-07 DIAGNOSIS — G8929 Other chronic pain: Secondary | ICD-10-CM | POA: Insufficient documentation

## 2022-02-07 DIAGNOSIS — M5442 Lumbago with sciatica, left side: Secondary | ICD-10-CM | POA: Insufficient documentation

## 2022-02-07 DIAGNOSIS — R29898 Other symptoms and signs involving the musculoskeletal system: Secondary | ICD-10-CM | POA: Insufficient documentation

## 2022-02-07 DIAGNOSIS — M7051 Other bursitis of knee, right knee: Secondary | ICD-10-CM | POA: Diagnosis not present

## 2022-02-07 DIAGNOSIS — M6281 Muscle weakness (generalized): Secondary | ICD-10-CM | POA: Diagnosis not present

## 2022-02-07 DIAGNOSIS — M7052 Other bursitis of knee, left knee: Secondary | ICD-10-CM | POA: Diagnosis not present

## 2022-02-07 DIAGNOSIS — M62838 Other muscle spasm: Secondary | ICD-10-CM | POA: Diagnosis not present

## 2022-02-07 DIAGNOSIS — S32010S Wedge compression fracture of first lumbar vertebra, sequela: Secondary | ICD-10-CM | POA: Diagnosis not present

## 2022-02-07 DIAGNOSIS — R2689 Other abnormalities of gait and mobility: Secondary | ICD-10-CM | POA: Insufficient documentation

## 2022-02-07 DIAGNOSIS — M5459 Other low back pain: Secondary | ICD-10-CM | POA: Diagnosis not present

## 2022-02-07 DIAGNOSIS — M545 Low back pain, unspecified: Secondary | ICD-10-CM | POA: Insufficient documentation

## 2022-02-07 NOTE — Therapy (Signed)
OUTPATIENT PHYSICAL THERAPY THORACOLUMBAR EVALUATION   Patient Name: [name] MRN: [id number] DOB:Dec 25, 1960, 61 y.o., female Today's Date: 02/07/2022   PT End of Session - 02/07/22 0949     Visit Number 1    Number of Visits 6    Date for PT Re-Evaluation 03/21/22    Authorization Type [employer] (no auth, MCR guidelines)    PT Start Time 0949    PT Stop Time 1025    PT Time Calculation (min) 36 min    Activity Tolerance Patient tolerated treatment well    Behavior During Therapy [facility] for tasks assessed/performed             Past Medical History:  Diagnosis Date   Allergy    grass, dust , mold   Anxiety    Arthritis    Asthma due to seasonal allergies 06/15/2020   Bipolar disorder ([place])    Carpal tunnel syndrome    Bilateral   Chest pain 09/2011   Cardiac cath-normal coronaries   Constipation    Depression    Difficulty urinating 05/31/2013   Elevated LFTs 12/16/2013   GERD (gastroesophageal reflux disease)    History of kidney stones    Hyperlipemia    Hyperlipidemia    Hypertension    Mild; provoked by stress and anxiety   IBS (irritable bowel syndrome)    Intracerebral bleed ([facility])    No aneurysm; followed by Dr. Carolan Shiver replaced    "lense transplant" 2022; pt states lense don't dilate or constrict   Loss of weight 01/06/2015   Osteoporosis    Stage 3a chronic kidney disease ([place]) 03/16/2021   Stroke ([place]) 1999   hemorrhagic stroke; weakness of left side   Past Surgical History:  Procedure Laterality Date   ABDOMINAL HYSTERECTOMY     "cancer cells"   BIOPSY  04/16/2021   Procedure: BIOPSY;  Surgeon: Montez Morita, Quillian Quince, MD;  [facility];  Service: Gastroenterology;;  small, bowel, esophageal(proximal and distal);   BRAIN SURGERY  1999   to remove blood clot after stroke    CARDIAC CATHETERIZATION  2016   CERVICAL FUSION     CHOLECYSTECTOMY N/A 10/14/2014   Procedure: LAPAROSCOPIC CHOLECYSTECTOMY WITH  INTRAOPERATIVE CHOLANGIOGRAM;  Surgeon: Jackolyn Confer, MD;  Location: [place];  Service: General;  Laterality: N/A;   CHONDROPLASTY Right 07/13/2017   Procedure: CHONDROPLASTY of patella;  Surgeon: Carole Civil, MD;  Location: [facility];  Service: Orthopedics;  Laterality: Right;   ESOPHAGEAL DILATION N/A 04/16/2021   Procedure: ESOPHAGEAL DILATION;  Surgeon: Harvel Quale, MD;  [facility];  Service: Gastroenterology;  Laterality: N/A;   ESOPHAGOGASTRODUODENOSCOPY (EGD) WITH PROPOFOL N/A 04/16/2021   Procedure: ESOPHAGOGASTRODUODENOSCOPY (EGD) WITH PROPOFOL;  Surgeon: Harvel Quale, MD;  [facility];  Service: Gastroenterology;  Laterality: N/A;  1:35, pt knows to arrive at 9:45   EUS N/A 08/21/2015   Procedure: ESOPHAGEAL ENDOSCOPIC ULTRASOUND (EUS) RADIAL;  Surgeon: Carol Ada, MD;  [facility]: [facility];  Service: Endoscopy;  Laterality: N/A;   KNEE ARTHROSCOPY WITH MEDIAL MENISECTOMY Right 07/13/2017   Procedure: KNEE ARTHROSCOPY WITH PARTIAL MEDIAL MENISECTOMY;  Surgeon: Carole Civil, MD;  Location: [facility];  Service: Orthopedics;  Laterality: Right;   LEFT HEART CATHETERIZATION WITH CORONARY ANGIOGRAM N/A 09/23/2011   Procedure: LEFT HEART CATHETERIZATION WITH CORONARY ANGIOGRAM;  Surgeon: Thayer Headings, MD;  Location: [facility];  Service: Cardiovascular;  Laterality: N/A;   LIPOMA EXCISION Left 11/18/2013  Procedure: EXCISION OF SOFT TISSUE MASS-LEFT THIGH;  Surgeon: Jamesetta So, MD;  Location: [facility];  Service: General;  Laterality: Left;   NASAL SEPTOPLASTY W/ TURBINOPLASTY Bilateral 08/30/2021   Procedure: NASAL SEPTOPLASTY WITH BILATERAL TURBINATE REDUCTION;  Surgeon: Leta Baptist, MD;  Location: [place];  Service: ENT;  Laterality: Bilateral;   POLYPECTOMY  04/16/2021   Procedure: POLYPECTOMY;  Surgeon: Harvel Quale, MD;  [facility];  Service: Gastroenterology;;  gastric   RECTOCELE  REPAIR     x2   RECTOCELE REPAIR N/A 04/04/2017   Procedure: POSTERIOR REPAIR (RECTOCELE);  Surgeon: Jonnie Kind, MD;  Location: [facility];  Service: Gynecology;  Laterality: N/A;   Patient Active Problem List   Diagnosis Date Noted   Lumbar compression fracture ([place]) 12/23/2021   Lumbar spondylosis 11/04/2021   Encounter for general adult medical examination with abnormal findings 07/07/2021   Gastric polyp 05/31/2021   NASH (nonalcoholic steatohepatitis) 04/01/2021   Dysphagia 04/01/2021   Stage 3a chronic kidney disease ([place]) 03/16/2021   Breast pain 03/02/2021   Arthritis 01/31/2021   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   History of hemorrhagic stroke with residual hemiplegia ([place]) 06/15/2020   Urinary incontinence 06/15/2020   Asthma due to seasonal allergies 06/15/2020   Vaginal itching 03/16/2020   S/P right knee arthroscopy 07/13/17 07/20/2017   Derangement of posterior horn of medial meniscus of right knee    Chondromalacia, patella, right    IBS (irritable bowel syndrome) 05/11/2015   Gait instability 01/06/2015   DDD (degenerative disc disease), lumbar 02/18/2014   Postmenopausal atrophic vaginitis 11/01/2013   Lipoma of abdominal wall 08/14/2013   Back pain 05/29/2013   Paresthesia of foot 02/12/2013   Hypertension    GERD (gastroesophageal reflux disease)    Depression 11/17/2011   Insomnia 11/17/2011   Mixed hyperlipidemia 09/20/2011   Anxiety 09/20/2011    PCP: Ihor Dow MD  REFERRING PROVIDER: Carole Civil, MD   REFERRING DIAG: M54.50,G89.29 (ICD-10-CM) - Chronic midline low back pain, unspecified whether sciatica present S32.010S (ICD-10-CM) - Compression fracture of L1 vertebra, sequela M51.36 (ICD-10-CM) - DDD (degenerative disc disease), lumbar M54.42,G89.29 (ICD-10-CM) - Chronic left-sided low back pain with left-sided sciatica   Rationale for Evaluation and Treatment Rehabilitation  THERAPY DIAG:  Other low back pain  Muscle weakness  (generalized)  Other abnormalities of gait and mobility  Other symptoms and signs involving the musculoskeletal system  ONSET DATE: Chronic  SUBJECTIVE:  SUBJECTIVE STATEMENT: Patient states she has arthritis in her back. She has been doing acupuncture but hasn't noticed much change. Standing at kitchen sink makes her back really hurt. She had PT in past but did not have any relief. She had chiro in past and they did heat and estim. She likes to work out in the yard. Symptoms in back have gotten worse since fracture in 2017. Her balance is not good. She was going to come in a couple months ago but fell and rescheduled. Has different braces she can use when doing yard work.    PERTINENT HISTORY:  Chronic LBP, CKD, hx bipolar disorder, HTN, HLD, IBS, osteoporosis, hx CVA, R knee pain  PAIN:  Are you having pain? Yes: NPRS scale: 4/10 Pain location: low back Pain description: achy Aggravating factors: standing at sink, vacuuming, mopping, lifting Relieving factors: none   PRECAUTIONS: None  WEIGHT BEARING RESTRICTIONS No  FALLS:  Has patient fallen in last 6 months? Yes. Number of falls 1  LIVING ENVIRONMENT: Lives with: lives with their spouse Lives in: Mobile home Stairs: Yes: External: 6 steps; on right going up Has following equipment at home: None  OCCUPATION: Disability  PLOF: Independent  PATIENT GOALS decrease pain   OBJECTIVE:   DIAGNOSTIC FINDINGS:  MRI 12/13/21 IMPRESSION: 1. Limited degenerative changes with no neural impingement. 2. Remote L1 superior endplate fracture, also seen in 2017.  PATIENT SURVEYS:  FOTO not uploaded - complete next session  SCREENING FOR RED FLAGS: Bowel or bladder incontinence: No Spinal tumors: No Cauda equina syndrome: No Compression  fracture: Yes: hx L1 compression fracture 2017 Abdominal aneurysm: No  COGNITION:  Overall cognitive status: Within functional limits for tasks assessed     SENSATION: WFL   POSTURE: rounded shoulders, forward head, and decreased lumbar lordosis  PALPATION: Decreased lumbar lordosis  LUMBAR ROM:   Active  A/PROM  eval  Flexion 0% limited *  Extension 25% limited*  Right lateral flexion 25% limited *   Left lateral flexion 25% limited*  Right rotation 25% limited  Left rotation 25% limited   (Blank rows = not tested)*=pain  LOWER EXTREMITY ROM:   WFL for tasks assessed  Active  Right eval Left eval  Hip flexion    Hip extension    Hip abduction    Hip adduction    Hip internal rotation    Hip external rotation    Knee flexion    Knee extension    Ankle dorsiflexion    Ankle plantarflexion    Ankle inversion    Ankle eversion     (Blank rows = not tested)  LOWER EXTREMITY MMT:    MMT Right eval Left eval  Hip flexion 4+ 4  Hip extension 4 * 4 *  Hip abduction 4+ 4-  Hip adduction    Hip internal rotation    Hip external rotation    Knee flexion 4+ 4+  Knee extension 4+ 4+  Ankle dorsiflexion 5 5  Ankle plantarflexion    Ankle inversion    Ankle eversion     (Blank rows = not tested) *= pain    FUNCTIONAL TESTS:  5 times sit to stand: 12.62 seconds without UE support 2 minute walk test: 325 feet  GAIT: Distance walked: 325 feet Assistive device utilized: None Level of assistance: Complete Independence Comments: 2MWT, slow, increasing LBP at 1:40, decreased foot clearance bilaterally     TODAY'S TREATMENT  02/07/22 Bridge 2x 10    PATIENT EDUCATION:  Education details: Patient educated on exam findings, POC, scope of PT, HEP. Person educated: Patient Education method: Explanation, Demonstration, and Handouts Education comprehension: verbalized understanding, returned demonstration, verbal cues required, and tactile cues  required   HOME EXERCISE PROGRAM: Access Code: [email or web address] URL: [email or web address] Date: 02/07/2022 - Supine Bridge  - 2 x daily - 7 x weekly - 2 sets - 10 reps  ASSESSMENT:  CLINICAL IMPRESSION: Patient a 61 y.o. y.o. female who was seen today for physical therapy evaluation and treatment for chronic LBP. Patient presents with pain limited deficits in lumbar spine and LE strength, ROM, endurance, activity tolerance, and functional mobility with ADL. Patient is having to modify and restrict ADL as indicated by outcome measure score as well as subjective information and objective measures which is affecting overall participation. Patient will benefit from skilled physical therapy in order to improve function and reduce impairment.   OBJECTIVE IMPAIRMENTS decreased activity tolerance, decreased endurance, decreased mobility, difficulty walking, decreased ROM, decreased strength, increased muscle spasms, impaired flexibility, improper body mechanics, postural dysfunction, and pain.   ACTIVITY LIMITATIONS carrying, lifting, bending, standing, squatting, stairs, transfers, locomotion level, and caring for others  PARTICIPATION LIMITATIONS: meal prep, cleaning, laundry, shopping, community activity, and yard work  PERSONAL FACTORS Time since onset of injury/illness/exacerbation and 3+ comorbidities: Chronic LBP, CKD, hx bipolar disorder, HTN, HLD, IBS, osteoporosis, hx CVA, R knee pain  are also affecting patient's functional outcome.   REHAB POTENTIAL: Good  CLINICAL DECISION MAKING: Stable/uncomplicated  EVALUATION COMPLEXITY: Low   GOALS: Goals reviewed with patient? Yes  SHORT TERM GOALS: Target date: 02/28/2022  Patient will be independent with HEP in order to improve functional outcomes. Baseline:  Goal status: INITIAL  2.  Patient will report at least 25% improvement in symptoms for improved quality of life. Baseline:  Goal status: INITIAL   LONG TERM GOALS:  Target date: 03/21/2022  Patient will report at least 75% improvement in symptoms for improved quality of life. Baseline:  Goal status: INITIAL  2.  Patient will demonstrate grade of 5/5 MMT grade in all tested musculature as evidence of improved strength to assist with stair ambulation and gait.   Baseline: see MMT Goal status: INITIAL  3.  Patient will demonstrate at least 25% improvement in lumbar ROM in all restricted planes for improved ability to move trunk while completing chores. Baseline: see AROM Goal status: INITIAL  4.  Patient will be able to ambulate at least 425 feet in 2MWT in order to demonstrate improved tolerance to activity. Baseline: 02/07/22 325 feet Goal status: INITIAL  5.  Patient will be able to complete 5x STS in under 11 seconds in order to reduce the risk of falls. Baseline: 02/07/22 12.62 seconds Goal status: INITIAL     PLAN: PT FREQUENCY: 1x/week  PT DURATION: 6 weeks  PLANNED INTERVENTIONS: Therapeutic exercises, Therapeutic activity, Neuromuscular re-education, Balance training, Gait training, Patient/Family education, Joint manipulation, Joint mobilization, Stair training, Orthotic/Fit training, DME instructions, Aquatic Therapy, Dry Needling, Electrical stimulation, Spinal manipulation, Spinal mobilization, Cryotherapy, Moist heat, Compression bandaging, scar mobilization, Splintting, Taping, Traction, Ultrasound, Ionotophoresis 50m/ml Dexamethasone, and Manual therapy  PLAN FOR NEXT SESSION: complete FOTO, f/u with HEP, core/glute strength, postural strengthening   AVianne BullsZaunegger, PT 02/07/2022, 10:32 AM

## 2022-02-07 NOTE — Patient Instructions (Signed)

## 2022-02-07 NOTE — Progress Notes (Signed)
The patient has requested an injection  Complaint medial proximal tibial pain over the pes bursa  Diagnosis  Encounter Diagnosis  Name Primary?   Pes anserinus bursitis of both knees Yes     After appropriate timeout for site confirmation medication confirmation,  The pes bursa area of the right and left knee was prepped with alcohol and ethyl chloride spray.  The injection was performed at the pes bursa right and left knee  Medication Depomedrol 40 mg and 1% lidocaine plain   There were no complications  The patient was observed for any reactions there were none and the patient was discharged.   FU PRN

## 2022-02-08 ENCOUNTER — Encounter (HOSPITAL_COMMUNITY): Payer: Self-pay | Admitting: Physical Therapy

## 2022-02-08 NOTE — Therapy (Unsigned)
OUTPATIENT PHYSICAL THERAPY FEMALE PELVIC EVALUATION   Patient Name: [name] MRN: [id number] DOB:11-18-1960, 61 y.o., female Today's Date: 02/08/2022    Past Medical History:  Diagnosis Date   Allergy    grass, dust , mold   Anxiety    Arthritis    Asthma due to seasonal allergies 06/15/2020   Bipolar disorder (HCC)    Carpal tunnel syndrome    Bilateral   Chest pain 09/2011   Cardiac cath-normal coronaries   Constipation    Depression    Difficulty urinating 05/31/2013   Elevated LFTs 12/16/2013   GERD (gastroesophageal reflux disease)    History of kidney stones    Hyperlipemia    Hyperlipidemia    Hypertension    Mild; provoked by stress and anxiety   IBS (irritable bowel syndrome)    Intracerebral bleed (HCC)    No aneurysm; followed by Dr. Athena Masse replaced    "lense transplant" 2022; pt states lense don't dilate or constrict   Loss of weight 01/06/2015   Osteoporosis    Stage 3a chronic kidney disease (HCC) 03/16/2021   Stroke (HCC) 1999   hemorrhagic stroke; weakness of left side   Past Surgical History:  Procedure Laterality Date   ABDOMINAL HYSTERECTOMY     "cancer cells"   BIOPSY  04/16/2021   Procedure: BIOPSY;  Surgeon: Marguerita Merles, Reuel Boom, MD;  Location: [facility];  Service: Gastroenterology;;  small, bowel, esophageal(proximal and distal);   BRAIN SURGERY  1999   to remove blood clot after stroke    CARDIAC CATHETERIZATION  2016   CERVICAL FUSION     CHOLECYSTECTOMY N/A 10/14/2014   Procedure: LAPAROSCOPIC CHOLECYSTECTOMY WITH INTRAOPERATIVE CHOLANGIOGRAM;  Surgeon: Avel Peace, MD;  Location: [facility];  Service: General;  Laterality: N/A;   CHONDROPLASTY Right 07/13/2017   Procedure: CHONDROPLASTY of patella;  Surgeon: Vickki Hearing, MD;  Location: [facility];  Service: Orthopedics;  Laterality: Right;   ESOPHAGEAL DILATION N/A 04/16/2021   Procedure: ESOPHAGEAL DILATION;  Surgeon: Dolores Frame, MD;   Location: [facility];  Service: Gastroenterology;  Laterality: N/A;   ESOPHAGOGASTRODUODENOSCOPY (EGD) WITH PROPOFOL N/A 04/16/2021   Procedure: ESOPHAGOGASTRODUODENOSCOPY (EGD) WITH PROPOFOL;  Surgeon: Dolores Frame, MD;  [facility]: [facility];  Service: Gastroenterology;  Laterality: N/A;  1:35, pt knows to arrive at 9:45   EUS N/A 08/21/2015   Procedure: ESOPHAGEAL ENDOSCOPIC ULTRASOUND (EUS) RADIAL;  Surgeon: [name], MD;  Location: [facility];  Service: Endoscopy;  Laterality: N/A;   KNEE ARTHROSCOPY WITH MEDIAL MENISECTOMY Right 07/13/2017   Procedure: KNEE ARTHROSCOPY WITH PARTIAL MEDIAL MENISECTOMY;  Surgeon: Vickki Hearing, MD;  Location: [facility];  Service: Orthopedics;  Laterality: Right;   LEFT HEART CATHETERIZATION WITH CORONARY ANGIOGRAM N/A 09/23/2011   Procedure: LEFT HEART CATHETERIZATION WITH CORONARY ANGIOGRAM;  Surgeon: Vesta Mixer, MD;  Location[facility];  Service: Cardiovascular;  Laterality: N/A;   LIPOMA EXCISION Left 11/18/2013   Procedure: EXCISION OF SOFT TISSUE MASS-LEFT THIGH;  Surgeon: Dalia Heading, MD;  Location: [facility];  Service: General;  Laterality: Left;   NASAL SEPTOPLASTY W/ TURBINOPLASTY Bilateral 08/30/2021   Procedure: NASAL SEPTOPLASTY WITH BILATERAL TURBINATE REDUCTION;  Surgeon: Newman Pies, MD;  Location: [facility];  Service: ENT;  Laterality: Bilateral;   POLYPECTOMY  04/16/2021   Procedure: POLYPECTOMY;  Surgeon: Dolores Frame, MD;  [facility]: [facility];  Service: Gastroenterology;;  gastric   RECTOCELE REPAIR     x2  RECTOCELE REPAIR N/A 04/04/2017   Procedure: POSTERIOR REPAIR (RECTOCELE);  Surgeon: Tilda Burrow, MD;  Location: [facility];  Service: Gynecology;  Laterality: N/A;   Patient Active Problem List   Diagnosis Date Noted   Dystrophia unguium 02/01/2022   Lumbar compression fracture (HCC) 12/23/2021   Lumbar spondylosis 11/04/2021   Encounter for general adult medical  examination with abnormal findings 07/07/2021   Gastric polyp 05/31/2021   NASH (nonalcoholic steatohepatitis) 04/01/2021   Dysphagia 04/01/2021   Stage 3a chronic kidney disease (HCC) 03/16/2021   Breast pain 03/02/2021   Arthritis 01/31/2021   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   History of hemorrhagic stroke with residual hemiplegia (HCC) 06/15/2020   Urinary incontinence 06/15/2020   Asthma due to seasonal allergies 06/15/2020   Vaginal itching 03/16/2020   S/P right knee arthroscopy 07/13/17 07/20/2017   Derangement of posterior horn of medial meniscus of right knee    Chondromalacia, patella, right    IBS (irritable bowel syndrome) 05/11/2015   Gait instability 01/06/2015   DDD (degenerative disc disease), lumbar 02/18/2014   Postmenopausal atrophic vaginitis 11/01/2013   Lipoma of abdominal wall 08/14/2013   Back pain 05/29/2013   Paresthesia of foot 02/12/2013   Hypertension    GERD (gastroesophageal reflux disease)    Depression 11/17/2011   Insomnia 11/17/2011   Mixed hyperlipidemia 09/20/2011   Anxiety 09/20/2011    PCP: Anabel Halon, MD  REFERRING PROVIDER: Marguerita Beards, MD  REFERRING DIAG:  N94.10 (ICD-10-CM) - Dyspareunia, female  [phone] (ICD-10-CM) - Levator spasm    THERAPY DIAG:  No diagnosis found.  Rationale for Evaluation and Treatment Rehabilitation  ONSET DATE: ***  SUBJECTIVE:                                                                                                                                                                                           SUBJECTIVE STATEMENT: *** Fluid intake: {Yes/No:304960894}    PAIN:  Are you having pain? {yes/no:20286} NPRS scale: ***/10 Pain location: {pelvic pain location:27098}  Pain type: {type:313116} Pain description: {PAIN DESCRIPTION:21022940}   Aggravating factors: *** Relieving factors: ***  PRECAUTIONS: {Therapy precautions:24002}  WEIGHT BEARING RESTRICTIONS {Yes  ***/No:24003}  FALLS:  Has patient fallen in last 6 months? {fallsyesno:27318}  LIVING ENVIRONMENT: Lives with: {[facility] lives with:25569::"lives with their family"} Lives in: {Lives in:25570} Stairs: {opstairs:27293} Has following equipment at home: {Assistive devices:23999}  OCCUPATION: ***  PLOF: {PLOF:24004}  PATIENT GOALS ***  PERTINENT HISTORY:  Hysterectomy; osteoporosis; IBS; Stage 3a chronic kidney disease; stroke; rectocele repair x 3 Sexual abuse: {Yes/No:304960894}  BOWEL MOVEMENT Pain with bowel movement: {yes/no:20286} Type of bowel movement:{PT BM  type:27100} Fully empty rectum: {Yes/No:304960894} Leakage: {Yes/No:304960894} Pads: {Yes/No:304960894} Fiber supplement: {Yes/No:304960894}  URINATION Pain with urination: {yes/no:20286} Fully empty bladder: {Yes/No:304960894} Stream: {PT urination:27102} Urgency: {Yes/No:304960894} Frequency: *** Leakage: {PT leakage:27103} Pads: {Yes/No:304960894}  INTERCOURSE Pain with intercourse: {pain with intercourse PA:27099} Ability to have vaginal penetration:  {Yes/No:304960894} Climax: *** Marinoff Scale: ***/3  PREGNANCY Vaginal deliveries *** Tearing {Yes***/No:304960894} C-section deliveries *** Currently pregnant {Yes***/No:304960894}  PROLAPSE {PT prolapse:27101}    OBJECTIVE:   DIAGNOSTIC FINDINGS:  PVR of 101 ml was obtained by bladder scan.  PATIENT SURVEYS:  {rehab surveys:24030}  PFIQ-7 ***  COGNITION:  Overall cognitive status: {cognition:24006}     SENSATION:  Light touch: {intact/deficits:24005}  Proprioception: {intact/deficits:24005}  MUSCLE LENGTH: Hamstrings: Right *** deg; Left *** deg Thomas test: Right *** deg; Left *** deg  LUMBAR SPECIAL TESTS:  {lumbar special test:25242}  FUNCTIONAL TESTS:  {Functional tests:24029}  GAIT: Distance walked: *** Assistive device utilized: {Assistive devices:23999} Level of assistance: {Levels of assistance:24026} Comments:  ***               POSTURE: {posture:25561}   PELVIC ALIGNMENT:  LUMBARAROM/PROM  A/PROM A/PROM  eval  Flexion   Extension   Right lateral flexion   Left lateral flexion   Right rotation   Left rotation    (Blank rows = not tested)  LOWER EXTREMITY ROM:  {AROM/PROM:27142} ROM Right eval Left eval  Hip flexion    Hip extension    Hip abduction    Hip adduction    Hip internal rotation    Hip external rotation    Knee flexion    Knee extension    Ankle dorsiflexion    Ankle plantarflexion    Ankle inversion    Ankle eversion     (Blank rows = not tested)  LOWER EXTREMITY MMT:  MMT Right eval Left eval  Hip flexion    Hip extension    Hip abduction    Hip adduction    Hip internal rotation    Hip external rotation    Knee flexion    Knee extension    Ankle dorsiflexion    Ankle plantarflexion    Ankle inversion    Ankle eversion      PALPATION:   General  ***                External Perineal Exam ***                             Internal Pelvic Floor ***  Patient confirms identification and approves PT to assess internal pelvic floor and treatment {yes/no:20286}  PELVIC MMT:   MMT eval  Vaginal   Internal Anal Sphincter   External Anal Sphincter   Puborectalis   Diastasis Recti   (Blank rows = not tested)        TONE: ***  PROLAPSE: ***  TODAY'S TREATMENT  EVAL ***   PATIENT EDUCATION:  Education details: *** Person educated: {Person educated:25204} Education method: {Education Method:25205} Education comprehension: {Education Comprehension:25206}   HOME EXERCISE PROGRAM: ***  ASSESSMENT:  CLINICAL IMPRESSION: Patient is a *** y.o. *** who was seen today for physical therapy evaluation and treatment for ***.    OBJECTIVE IMPAIRMENTS {opptimpairments:25111}.   ACTIVITY LIMITATIONS {activitylimitations:27494}  PARTICIPATION LIMITATIONS: {participationrestrictions:25113}  PERSONAL FACTORS {Personal factors:25162} are  also affecting patient's functional outcome.   REHAB POTENTIAL: {rehabpotential:25112}  CLINICAL DECISION MAKING: {clinical decision making:25114}  EVALUATION COMPLEXITY: {Evaluation complexity:25115}  GOALS: Goals reviewed with patient? {yes/no:20286}  SHORT TERM GOALS: Target date: {follow up:25551}  *** Baseline: Goal status: {GOALSTATUS:25110}  2.  *** Baseline:  Goal status: {GOALSTATUS:25110}  3.  *** Baseline:  Goal status: {GOALSTATUS:25110}  4.  *** Baseline:  Goal status: {GOALSTATUS:25110}  5.  *** Baseline:  Goal status: {GOALSTATUS:25110}  6.  *** Baseline:  Goal status: {GOALSTATUS:25110}  LONG TERM GOALS: Target date: {follow up:25551}   *** Baseline:  Goal status: {GOALSTATUS:25110}  2.  *** Baseline:  Goal status: {GOALSTATUS:25110}  3.  *** Baseline:  Goal status: {GOALSTATUS:25110}  4.  *** Baseline:  Goal status: {GOALSTATUS:25110}  5.  *** Baseline:  Goal status: {GOALSTATUS:25110}  6.  *** Baseline:  Goal status: {GOALSTATUS:25110}  PLAN: PT FREQUENCY: {rehab frequency:25116}  PT DURATION: {rehab duration:25117}  PLANNED INTERVENTIONS: {rehab planned interventions:25118::"Therapeutic exercises","Therapeutic activity","Neuromuscular re-education","Balance training","Gait training","Patient/Family education","Self Care","Joint mobilization"}  PLAN FOR NEXT SESSION: ***   Aadam Zhen, PT 02/08/2022, 12:00 PM

## 2022-02-10 ENCOUNTER — Encounter: Payer: PPO | Attending: Obstetrics and Gynecology | Admitting: Physical Therapy

## 2022-02-10 ENCOUNTER — Encounter: Payer: Self-pay | Admitting: Physical Therapy

## 2022-02-10 ENCOUNTER — Ambulatory Visit (INDEPENDENT_AMBULATORY_CARE_PROVIDER_SITE_OTHER): Payer: PPO | Admitting: *Deleted

## 2022-02-10 ENCOUNTER — Other Ambulatory Visit: Payer: Self-pay

## 2022-02-10 DIAGNOSIS — R102 Pelvic and perineal pain: Secondary | ICD-10-CM | POA: Insufficient documentation

## 2022-02-10 DIAGNOSIS — I10 Essential (primary) hypertension: Secondary | ICD-10-CM

## 2022-02-10 DIAGNOSIS — M62838 Other muscle spasm: Secondary | ICD-10-CM | POA: Diagnosis not present

## 2022-02-10 DIAGNOSIS — E782 Mixed hyperlipidemia: Secondary | ICD-10-CM

## 2022-02-10 DIAGNOSIS — M6281 Muscle weakness (generalized): Secondary | ICD-10-CM | POA: Diagnosis not present

## 2022-02-10 NOTE — Patient Instructions (Signed)
Visit Information  Thank you for taking time to visit with me today. Please don't hesitate to contact me if I can be of assistance to you before our next scheduled telephone appointment.  Following are the goals we discussed today:  Take medications as prescribed   Attend all scheduled provider appointments Call pharmacy for medication refills 3-7 days in advance of running out of medications Attend church or other social activities Perform all self care activities independently  Perform IADL's (shopping, preparing meals, housekeeping, managing finances) independently Call provider office for new concerns or questions  check blood pressure weekly choose a place to take my blood pressure (home, clinic or office, retail store) write blood pressure results in a log or diary take blood pressure log to all doctor appointments call doctor for signs and symptoms of high blood pressure keep all doctor appointments take medications for blood pressure exactly as prescribed begin an exercise program report new symptoms to your doctor eat more whole grains, fruits and vegetables, lean meats and healthy fats - take all medications exactly as prescribed - call doctor with any symptoms you believe are related to your medicine - call doctor when you experience any new symptoms - adhere to prescribed diet: heart healthy and low sodium Follow low sodium and heart healthy diet Continue to get outside and walk Continue working with outpatient physical therapist Case closure today- please talk with your doctor if you have any future care management needs  Please call the care guide team at [phone] if you need to cancel or reschedule your appointment.   If you are experiencing a Mental Health or [place] or need someone to talk to, please call the Suicide and Crisis Lifeline: 988 call the [place] National Suicide Prevention Lifeline: 254-737-8848 or TTY: 934-246-9882 TTY  832-785-4485) to talk to a trained counselor call 1-800-273-TALK (toll free, 24 hour hotline) go to [facility] [address] [employer] [phone]) call the [place]: [phone] call 911   Patient verbalizes understanding of instructions and care plan provided today and agrees to view in [place]. Active MyChart status and patient understanding of how to access instructions and care plan via MyChart confirmed with patient.     Jacqlyn Larsen [facility], BSN RN Case Manager [facility] [phone]

## 2022-02-10 NOTE — Chronic Care Management (AMB) (Signed)
Chronic Care Management   CCM RN Visit Note  02/10/2022 Name: [name] MRN: [id number] DOB: 10/14/1960  Subjective: [name] is a 61 y.o. year old female who is a primary care patient of Lindell Spar, MD. The care management team was consulted for assistance with disease management and care coordination needs.    Engaged with patient by telephone for follow up visit in response to provider referral for case management and/or care coordination services.   Consent to Services:  The patient was given information about Chronic Care Management services, agreed to services, and gave verbal consent prior to initiation of services.  Please see initial visit note for detailed documentation.   Patient agreed to services and verbal consent obtained.   Assessment: Review of patient past medical history, allergies, medications, health status, including review of consultants reports, laboratory and other test data, was performed as part of comprehensive evaluation and provision of chronic care management services.   SDOH (Social Determinants of Health) assessments and interventions performed:    CCM Care Plan  Allergies  Allergen Reactions   Morphine And Related Hives   Promethazine Hcl Other (See Comments)    Causes patient to become Hyper    Outpatient Encounter Medications as of 02/10/2022  Medication Sig   acetaminophen (TYLENOL) 500 MG tablet Take 500-1,000 mg by mouth every 6 (six) hours as needed for moderate pain.   ALPRAZolam (XANAX) 1 MG tablet Take 1 tablet (1 mg total) by mouth 3 (three) times daily.   atorvastatin (LIPITOR) 40 MG tablet Take 1 tablet (40 mg total) by mouth daily.   Calcium 500-125 MG-UNIT TABS Take 1 tablet by mouth daily with supper.   Carboxymethylcellulose Sod PF 0.5 % SOLN Place 1 drop into both eyes daily as needed (dry eyes).   cyclobenzaprine (FLEXERIL) 5 MG tablet Take 1 tablet (5 mg total) by mouth 3 (three) times daily as needed. for  muscle spams   diclofenac (CATAFLAM) 50 MG tablet TAKE (1) TABLET BY MOUTH TWICE DAILY.   diclofenac Sodium (VOLTAREN) 1 % GEL Apply 1 application topically daily as needed (pain).   dicyclomine (BENTYL) 10 MG capsule TAKE ONE CAPSULE EVERY 12 HOURS AS NEEDED   estradiol (ESTRACE) 0.1 MG/GM vaginal cream Place 0.5 g vaginally 2 (two) times a week. Place 0.5g nightly for two weeks then twice a week after   ezetimibe (ZETIA) 10 MG tablet Take 1 tablet (10 mg total) by mouth daily.   famotidine (PEPCID) 20 MG tablet Take 20 mg by mouth daily as needed.   lidocaine-prilocaine (EMLA) cream Apply 1 application. topically as needed. Place on vagina. Wipe off excess after a few minutes   lisinopril (ZESTRIL) 20 MG tablet TAKE 1 TABLET BY MOUTH DAILY.   loratadine (CLARITIN) 10 MG tablet Take 10 mg by mouth daily.   methylphenidate (RITALIN) 20 MG tablet Take 1 tablet (20 mg total) by mouth 2 (two) times daily with breakfast and lunch.   montelukast (SINGULAIR) 10 MG tablet Take 1 tablet (10 mg total) by mouth at bedtime.   Multiple Vitamin (MULITIVITAMIN WITH MINERALS) TABS Take 1 tablet by mouth daily with breakfast.   nystatin (MYCOSTATIN/NYSTOP) powder Apply 1 application topically 3 (three) times daily. (Patient taking differently: Apply 1 application  topically 3 (three) times daily as needed.)   Olopatadine HCl 0.2 % SOLN Place 1 drop into both eyes in the morning and at bedtime.   Omega-3 Fatty Acids (FISH OIL) 1000 MG CAPS Take 2,000 mg  by mouth.   omeprazole (PRILOSEC) 40 MG capsule Take 1 capsule (40 mg total) by mouth daily.   oxybutynin (DITROPAN-XL) 10 MG 24 hr tablet Take 1 tablet (10 mg total) by mouth at bedtime.   Probiotic Product (TRUBIOTICS PO) Take 1 capsule by mouth daily. Takes 25 cfu daily.   pyridOXINE (VITAMIN B-6) 100 MG tablet Take 100 mg by mouth daily.   traZODone (DESYREL) 100 MG tablet Take 2 tablets (200 mg total) by mouth at bedtime.   venlafaxine XR (EFFEXOR XR) 150 MG  24 hr capsule Take 1 capsule (150 mg total) by mouth daily with breakfast.   azelastine (ASTELIN) 0.1 % nasal spray Place 1 spray in each nostril twice a day as needed for runny nose/drainage down the throat (Patient not taking: Reported on 01/26/2022)   fluticasone (FLONASE) 50 MCG/ACT nasal spray Place 1-2 sprays into both nostrils daily. (Patient not taking: Reported on 01/26/2022)   magic mouthwash (lidocaine, diphenhydrAMINE, alum & mag hydroxide) suspension Swish and swallow 5 mLs 3 (three) times daily as needed for mouth pain.   methylphenidate (RITALIN) 20 MG tablet Take 1 tablet (20 mg total) by mouth 2 (two) times daily with breakfast and lunch.   methylphenidate (RITALIN) 20 MG tablet Take 1 tablet (20 mg total) by mouth 2 (two) times daily with breakfast and lunch.   Prucalopride Succinate (MOTEGRITY) 1 MG TABS Take 1 tablet by mouth 2 (two) times daily.   No facility-administered encounter medications on file as of 02/10/2022.    Patient Active Problem List   Diagnosis Date Noted   Dystrophia unguium 02/01/2022   Lumbar compression fracture ([place]) 12/23/2021   Lumbar spondylosis 11/04/2021   Encounter for general adult medical examination with abnormal findings 07/07/2021   Gastric polyp 05/31/2021   NASH (nonalcoholic steatohepatitis) 04/01/2021   Dysphagia 04/01/2021   Stage 3a chronic kidney disease ([place]) 03/16/2021   Breast pain 03/02/2021   Arthritis 01/31/2021   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   History of hemorrhagic stroke with residual hemiplegia ([place]) 06/15/2020   Urinary incontinence 06/15/2020   Asthma due to seasonal allergies 06/15/2020   Vaginal itching 03/16/2020   S/P right knee arthroscopy 07/13/17 07/20/2017   Derangement of posterior horn of medial meniscus of right knee    Chondromalacia, patella, right    IBS (irritable bowel syndrome) 05/11/2015   Gait instability 01/06/2015   DDD (degenerative disc disease), lumbar 02/18/2014   Postmenopausal atrophic  vaginitis 11/01/2013   Lipoma of abdominal wall 08/14/2013   Back pain 05/29/2013   Paresthesia of foot 02/12/2013   Hypertension    GERD (gastroesophageal reflux disease)    Depression 11/17/2011   Insomnia 11/17/2011   Mixed hyperlipidemia 09/20/2011   Anxiety 09/20/2011    Conditions to be addressed/monitored:HTN and HLD  Care Plan : RN Care Manager Plan of Care  Updates made by Kassie Mends, RN since 02/10/2022 12:00 AM  Completed 02/10/2022   Problem: No plan of care established for management of chronic disease state  (HTN, HLD) Resolved 02/10/2022  Priority: High     Long-Range Goal: Development of plan of care for chronic disease management  (HTN, HLD) Completed 02/10/2022  Start Date: 11/11/2021  Expected End Date: 05/10/2022  Priority: High  Note:   Current Barriers:  Knowledge Deficits related to plan of care for management of HTN and HLD  Chronic Disease Management support and education needs related to HTN and HLD Patient reports she lives with spouse, is independent in all aspects of her  care, continues to drive, checks blood pressure daily and states "blood pressure very good", works in her yard and is thinking of joining the gym, tries to adhere to healthy diet and drinks mostly water. Pt has no advanced directives- has documents at home to complete. 02/10/22- patient reports she is going to outpatient physical therapy for her back pain and this will be for 6 weeks, using lidocaine patches with relief obtained, will be starting pelvic floor therapy exercises in the near future.    RNCM Clinical Goal(s):  Patient will verbalize understanding of plan for management of HTN and HLD as evidenced by patient report, review of EHR and  through collaboration with RN Care manager, provider, and care team.   Interventions: 1:1 collaboration with primary care provider regarding development and update of comprehensive plan of care as evidenced by provider attestation and  co-signature Inter-disciplinary care team collaboration (see longitudinal plan of care) Evaluation of current treatment plan related to  self management and patient's adherence to plan as established by provider   Hyperlipidemia:  (Status: New goal. Goal on Track (progressing): YES.) Long Term Goal  Lab Results  Component Value Date   CHOL 189 05/12/2021   HDL 61 05/12/2021   [place] 98 05/12/2021   TRIG 177 (H) 05/12/2021   CHOLHDL 3.1 05/12/2021     Medication review performed; medication list updated in electronic medical record.  Provider established cholesterol goals reviewed; Counseled on importance of regular laboratory monitoring as prescribed; Provided HLD educational materials; Reviewed importance of limiting foods high in cholesterol; Reviewed exercise goals and target of 150 minutes per week; Follow heart healthy diet Reviewed plan of care with patient including case closure today  Hypertension Interventions:  (Status:  New goal. and Goal on track:  Yes.) Long Term Goal Last practice recorded BP readings:  BP Readings from Last 3 Encounters:  11/09/21 122/68  11/04/21 132/84  11/03/21 128/80  Most recent eGFR/CrCl:  Lab Results  Component Value Date   EGFR 61 11/01/2021    No components found for: CRCL  Evaluation of current treatment plan related to hypertension self management and patient's adherence to plan as established by provider Reviewed medications with patient and discussed importance of compliance Counseled on the importance of exercise goals with target of 150 minutes per week Advised patient, providing education and rationale, to monitor blood pressure daily and record, calling PCP for findings outside established parameters Reviewed low sodium diet , importance of reading labels, limiting fast food  Patient Goals/Self-Care Activities: Take medications as prescribed   Attend all scheduled provider appointments Call pharmacy for medication refills  3-7 days in advance of running out of medications Attend church or other social activities Perform all self care activities independently  Perform IADL's (shopping, preparing meals, housekeeping, managing finances) independently Call provider office for new concerns or questions  check blood pressure weekly choose a place to take my blood pressure (home, clinic or office, retail store) write blood pressure results in a log or diary take blood pressure log to all doctor appointments call doctor for signs and symptoms of high blood pressure keep all doctor appointments take medications for blood pressure exactly as prescribed begin an exercise program report new symptoms to your doctor eat more whole grains, fruits and vegetables, lean meats and healthy fats - take all medications exactly as prescribed - call doctor with any symptoms you believe are related to your medicine - call doctor when you experience any new symptoms - adhere to prescribed  diet: heart healthy and low sodium Follow low sodium and heart healthy diet Continue to get outside and walk Continue working with outpatient physical therapist Case closure today- please talk with your doctor if you have any future care management needs       Plan:No further follow up required: case closure today  Jacqlyn Larsen [facility] [place], BSN RN Case Manager [facility] [phone]

## 2022-02-11 ENCOUNTER — Ambulatory Visit (HOSPITAL_COMMUNITY)
Admission: RE | Admit: 2022-02-11 | Discharge: 2022-02-11 | Disposition: A | Discharge status: Home / Self Care | Payer: PPO | Source: Ambulatory Visit | Attending: Internal Medicine | Admitting: Internal Medicine

## 2022-02-11 DIAGNOSIS — Z1231 Encounter for screening mammogram for malignant neoplasm of breast: Secondary | ICD-10-CM

## 2022-02-15 ENCOUNTER — Telehealth (INDEPENDENT_AMBULATORY_CARE_PROVIDER_SITE_OTHER): Payer: PPO | Admitting: Psychiatry

## 2022-02-15 ENCOUNTER — Encounter (HOSPITAL_COMMUNITY): Payer: PPO

## 2022-02-15 ENCOUNTER — Encounter (HOSPITAL_COMMUNITY): Payer: Self-pay | Admitting: Psychiatry

## 2022-02-15 DIAGNOSIS — F988 Other specified behavioral and emotional disorders with onset usually occurring in childhood and adolescence: Secondary | ICD-10-CM | POA: Diagnosis not present

## 2022-02-15 DIAGNOSIS — F332 Major depressive disorder, recurrent severe without psychotic features: Secondary | ICD-10-CM

## 2022-02-15 MED ORDER — METHYLPHENIDATE HCL 20 MG PO TABS
20.0000 mg | ORAL_TABLET | Freq: Two times a day (BID) | ORAL | 0 refills | Status: DC
Start: 2022-02-15 — End: 2022-02-17

## 2022-02-15 MED ORDER — ALPRAZOLAM 1 MG PO TABS
1.0000 mg | ORAL_TABLET | Freq: Three times a day (TID) | ORAL | 2 refills | Status: DC
Start: 2022-02-15 — End: 2022-05-10

## 2022-02-15 MED ORDER — VENLAFAXINE HCL ER 150 MG PO CP24: 150.0000 mg | ORAL_CAPSULE | Freq: Every day | ORAL | 2 refills | Status: DC

## 2022-02-15 MED ORDER — TRAZODONE HCL 100 MG PO TABS: 200.0000 mg | ORAL_TABLET | Freq: Every day | ORAL | 2 refills | Status: DC

## 2022-02-15 MED ORDER — METHYLPHENIDATE HCL 20 MG PO TABS: 20.0000 mg | ORAL_TABLET | Freq: Two times a day (BID) | ORAL | 0 refills | Status: DC

## 2022-02-15 NOTE — Progress Notes (Signed)
Virtual Visit via Telephone Note  I connected with [name] on 02/15/22 at  1:00 PM EDT by telephone and verified that I am speaking with the correct person using two identifiers.  Location: Patient: home Provider: home office   I discussed the limitations, risks, security and privacy concerns of performing an evaluation and management service by telephone and the availability of in person appointments. I also discussed with the patient that there may be a patient responsible charge related to this service. The patient expressed understanding and agreed to proceed.     I discussed the assessment and treatment plan with the patient. The patient was provided an opportunity to ask questions and all were answered. The patient agreed with the plan and demonstrated an understanding of the instructions.   The patient was advised to call back or seek an in-person evaluation if the symptoms worsen or if the condition fails to improve as anticipated.  I provided 15 minutes of non-face-to-face time during this encounter.   [name], MD  [facility] MD/PA/NP OP Progress Note  02/15/2022 1:25 PM [name]  MRN:  [id number]  Chief Complaint:  Chief Complaint  Patient presents with   Anxiety   Depression   ADD   Follow-up   HPI: T his patient is a 61 year old married white female who lives with her husband in [place]. She has no children. 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   The patient returns for follow-up after 3 months.  This is regarding her depression anxiety and trouble with focus.  Overall she is doing fairly well despite irritable bowel symptoms as well as back and knee pain.  She is trying to stay active by working in her garden and she and her husband have joined a gym.  She states that she "gets aggravated easily" but denies significant depression anxiety insomnia thoughts of self-harm or suicide.  The  methylphenidate continues to help her focus and short-term memory.  She is sleeping well Visit Diagnosis:    ICD-10-CM   1. Major depressive disorder, recurrent, severe without psychotic features ([place])  F33.2     2. ADD (attention deficit disorder) without hyperactivity  F98.8       Past Psychiatric History: Past out patient treatment for depression  Past Medical History:  Past Medical History:  Diagnosis Date   Allergy    grass, dust , mold   Anxiety    Arthritis    Asthma due to seasonal allergies 06/15/2020   Bipolar disorder ([place])    Carpal tunnel syndrome    Bilateral   Chest pain 09/2011   Cardiac cath-normal coronaries   Constipation    Depression    Difficulty urinating 05/31/2013   Elevated LFTs 12/16/2013   GERD (gastroesophageal reflux disease)    History of kidney stones    Hyperlipemia    Hyperlipidemia    Hypertension    Mild; provoked by stress and anxiety   IBS (irritable bowel syndrome)    Intracerebral bleed (HCC)    No aneurysm; followed by Dr. Carolan Shiver replaced    "lense transplant" 2022; pt states lense don't dilate or constrict   Loss of weight 01/06/2015   Osteoporosis    Stage 3a chronic kidney disease ([place]) 03/16/2021   Stroke ([place]) 1999   hemorrhagic stroke; weakness of left side    Past Surgical History:  Procedure Laterality Date   ABDOMINAL HYSTERECTOMY     "  cancer cells"   BIOPSY  04/16/2021   Procedure: BIOPSY;  Surgeon: Montez Morita, Quillian Quince, MD;  [facility];  Service: Gastroenterology;;  small, bowel, esophageal(proximal and distal);   BRAIN SURGERY  1999   to remove blood clot after stroke    CARDIAC CATHETERIZATION  2016   CERVICAL FUSION     CHOLECYSTECTOMY N/A 10/14/2014   Procedure: LAPAROSCOPIC CHOLECYSTECTOMY WITH INTRAOPERATIVE CHOLANGIOGRAM;  Surgeon: Jackolyn Confer, MD;  Location: [place];  Service: General;  Laterality: N/A;   CHONDROPLASTY Right 07/13/2017   Procedure: CHONDROPLASTY of patella;   Surgeon: Carole Civil, MD;  Location: [facility];  Service: Orthopedics;  Laterality: Right;   ESOPHAGEAL DILATION N/A 04/16/2021   Procedure: ESOPHAGEAL DILATION;  Surgeon: Harvel Quale, MD;  [facility];  Service: Gastroenterology;  Laterality: N/A;   ESOPHAGOGASTRODUODENOSCOPY (EGD) WITH PROPOFOL N/A 04/16/2021   Procedure: ESOPHAGOGASTRODUODENOSCOPY (EGD) WITH PROPOFOL;  Surgeon: Harvel Quale, MD;  [facility];  Service: Gastroenterology;  Laterality: N/A;  1:35, pt knows to arrive at 9:45   EUS N/A 08/21/2015   Procedure: ESOPHAGEAL ENDOSCOPIC ULTRASOUND (EUS) RADIAL;  Surgeon: Carol Ada, MD;  [facility];  Service: Endoscopy;  Laterality: N/A;   KNEE ARTHROSCOPY WITH MEDIAL MENISECTOMY Right 07/13/2017   Procedure: KNEE ARTHROSCOPY WITH PARTIAL MEDIAL MENISECTOMY;  Surgeon: Carole Civil, MD;  [facility];  Service: Orthopedics;  Laterality: Right;   LEFT HEART CATHETERIZATION WITH CORONARY ANGIOGRAM N/A 09/23/2011   Procedure: LEFT HEART CATHETERIZATION WITH CORONARY ANGIOGRAM;  Surgeon: Thayer Headings, MD;  Location: [facility] C[facility];  Service: Cardiovascular;  Laterality: N/A;   LIPOMA EXCISION Left 11/18/2013   Procedure: EXCISION OF SOFT TISSUE MASS-LEFT THIGH;  Surgeon: Jamesetta So, MD;  [facility];  Service: General;  Laterality: Left;   NASAL SEPTOPLASTY W/ TURBINOPLASTY Bilateral 08/30/2021   Procedure: NASAL SEPTOPLASTY WITH BILATERAL TURBINATE REDUCTION;  Surgeon: Leta Baptist, MD;  Location: [place];  Service: ENT;  Laterality: Bilateral;   POLYPECTOMY  04/16/2021   Procedure: POLYPECTOMY;  Surgeon: Harvel Quale, MD;  [facility];  Service: Gastroenterology;;  gastric   RECTOCELE REPAIR     x2   RECTOCELE REPAIR N/A 04/04/2017   Procedure: POSTERIOR REPAIR (RECTOCELE);  Surgeon: Jonnie Kind, MD;  [facility];  Service: Gynecology;  Laterality: N/A;     Family Psychiatric History: See below  Family History:  Family History  Problem Relation Age of Onset   Cancer Mother        breast    Hypertension Mother    Hyperlipidemia Mother    Depression Mother    Anxiety disorder Mother    COPD Mother    Arthritis Mother        rheumatoid   Drug abuse Sister    Coronary artery disease Paternal Grandfather    Coronary artery disease Paternal Uncle    Depression Cousin    Drug abuse Cousin     Social History:  Social History   Socioeconomic History   Marital status: Married    Spouse name: Sonia Side   Number of children: 0   Years of education: HS   Highest education level: Not on file  Occupational History   Occupation: unemployed    Comment: pending disability  Tobacco Use   Smoking status: Former    Packs/day: 1.00    Years: 19.00    Total pack years: 19.00    Types: Cigarettes  Quit date: 09/01/1997    Years since quitting: 24.4    Passive exposure: Past   Smokeless tobacco: Never   Tobacco comments:    Quit smoking 1999 , previous 20 pack years  Vaping Use   Vaping Use: Never used  Substance and Sexual Activity   Alcohol use: Yes    Comment: 1 drink every other week   Drug use: No   Sexual activity: Not Currently    Partners: Male    Birth control/protection: Surgical    Comment: hyst   Other Topics Concern   Not on file  Social History Narrative   Currently unable to work   Lives in [place]   Married   Patient drinks 1 cup of caffeine daily.   Patient is right handed.    Joined the [employer] to get more exercise   Social Determinants of Health   Financial Resource Strain: Low Risk  (01/06/2021)   Overall Financial Resource Strain (CARDIA)    Difficulty of Paying Living Expenses: Not hard at all  Food Insecurity: No Food Insecurity (11/11/2021)   Hunger Vital Sign    Worried About Running Out of Food in the Last Year: Never true    Ran Out of Food in the Last Year: Never true  Transportation Needs: No  Transportation Needs (11/11/2021)   PRAPARE - Hydrologist (Medical): No    Lack of Transportation (Non-Medical): No  Physical Activity: Inactive (01/06/2021)   Exercise Vital Sign    Days of Exercise per Week: 0 days    Minutes of Exercise per Session: 0 min  Stress: No Stress Concern Present (01/06/2021)   [place]    Feeling of Stress : Not at all  Social Connections: Moderately Isolated (01/06/2021)   Social Connection and Isolation Panel [NHANES]    Frequency of Communication with Friends and Family: More than three times a week    Frequency of Social Gatherings with Friends and Family: More than three times a week    Attends Religious Services: Never    Marine scientist or Organizations: No    Attends Archivist Meetings: Never    Marital Status: Married    Allergies:  Allergies  Allergen Reactions   Morphine And Related Hives   Promethazine Hcl Other (See Comments)    Causes patient to become Hyper    Metabolic Disorder Labs: Lab Results  Component Value Date   HGBA1C 5.5 11/01/2021   MPG 117 (H) 01/06/2015   MPG 117 (H) 02/17/2014   No results found for: "PROLACTIN" Lab Results  Component Value Date   CHOL 189 05/12/2021   TRIG 177 (H) 05/12/2021   HDL 61 05/12/2021   CHOLHDL 3.1 05/12/2021   VLDL 26 09/16/2015   LDLCALC 98 05/12/2021   LDLCALC 105 (H) 08/31/2020   Lab Results  Component Value Date   TSH 2.430 05/12/2021   TSH 1.770 08/31/2020    Therapeutic Level Labs: No results found for: "LITHIUM" No results found for: "VALPROATE" No results found for: "CBMZ"  Current Medications: Current Outpatient Medications  Medication Sig Dispense Refill   acetaminophen (TYLENOL) 500 MG tablet Take 500-1,000 mg by mouth every 6 (six) hours as needed for moderate pain.     ALPRAZolam (XANAX) 1 MG tablet Take 1 tablet (1 mg total) by mouth 3 (three)  times daily. 90 tablet 2   atorvastatin (LIPITOR) 40 MG tablet Take 1 tablet (40  mg total) by mouth daily. 90 tablet 3   Calcium 500-125 MG-UNIT TABS Take 1 tablet by mouth daily with supper.     Carboxymethylcellulose Sod PF 0.5 % SOLN Place 1 drop into both eyes daily as needed (dry eyes).     cyclobenzaprine (FLEXERIL) 5 MG tablet Take 1 tablet (5 mg total) by mouth 3 (three) times daily as needed. for muscle spams 30 tablet 11   diclofenac (CATAFLAM) 50 MG tablet TAKE (1) TABLET BY MOUTH TWICE DAILY. 90 tablet 0   diclofenac Sodium (VOLTAREN) 1 % GEL Apply 1 application topically daily as needed (pain).     dicyclomine (BENTYL) 10 MG capsule TAKE ONE CAPSULE EVERY 12 HOURS AS NEEDED 180 capsule 3   estradiol (ESTRACE) 0.1 MG/GM vaginal cream Place 0.5 g vaginally 2 (two) times a week. Place 0.5g nightly for two weeks then twice a week after 30 g 11   ezetimibe (ZETIA) 10 MG tablet Take 1 tablet (10 mg total) by mouth daily. 90 tablet 3   famotidine (PEPCID) 20 MG tablet Take 20 mg by mouth daily as needed.     lidocaine-prilocaine (EMLA) cream Apply 1 application. topically as needed. Place on vagina. Wipe off excess after a few minutes 30 g 5   lisinopril (ZESTRIL) 20 MG tablet TAKE 1 TABLET BY MOUTH DAILY. 90 tablet 0   loratadine (CLARITIN) 10 MG tablet Take 10 mg by mouth daily.     methylphenidate (RITALIN) 20 MG tablet Take 1 tablet (20 mg total) by mouth 2 (two) times daily with breakfast and lunch. 60 tablet 0   methylphenidate (RITALIN) 20 MG tablet Take 1 tablet (20 mg total) by mouth 2 (two) times daily with breakfast and lunch. 60 tablet 0   methylphenidate (RITALIN) 20 MG tablet Take 1 tablet (20 mg total) by mouth 2 (two) times daily with breakfast and lunch. 60 tablet 0   montelukast (SINGULAIR) 10 MG tablet Take 1 tablet (10 mg total) by mouth at bedtime. 90 tablet 1   Multiple Vitamin (MULITIVITAMIN WITH MINERALS) TABS Take 1 tablet by mouth daily with breakfast.     nystatin  (MYCOSTATIN/NYSTOP) powder Apply 1 application topically 3 (three) times daily. (Patient taking differently: Apply 1 application  topically 3 (three) times daily as needed.) 15 g 2   Olopatadine HCl 0.2 % SOLN Place 1 drop into both eyes in the morning and at bedtime.     Omega-3 Fatty Acids (FISH OIL) 1000 MG CAPS Take 2,000 mg by mouth.     omeprazole (PRILOSEC) 40 MG capsule Take 1 capsule (40 mg total) by mouth daily. 30 capsule 5   oxybutynin (DITROPAN-XL) 10 MG 24 hr tablet Take 1 tablet (10 mg total) by mouth at bedtime. 90 tablet 3   Probiotic Product (TRUBIOTICS PO) Take 1 capsule by mouth daily. Takes 25 cfu daily.     Prucalopride Succinate (MOTEGRITY) 1 MG TABS Take 1 tablet by mouth 2 (two) times daily. 180 tablet 1   pyridOXINE (VITAMIN B-6) 100 MG tablet Take 100 mg by mouth daily.     traZODone (DESYREL) 100 MG tablet Take 2 tablets (200 mg total) by mouth at bedtime. 60 tablet 2   venlafaxine XR (EFFEXOR XR) 150 MG 24 hr capsule Take 1 capsule (150 mg total) by mouth daily with breakfast. 90 capsule 2   No current facility-administered medications for this visit.     Musculoskeletal: Strength & Muscle Tone: na Gait & Station: na Patient leans: N/A  Psychiatric  Specialty Exam: Review of Systems  Gastrointestinal:  Positive for constipation and diarrhea.  Musculoskeletal:  Positive for arthralgias and back pain.  All other systems reviewed and are negative.   There were no vitals taken for this visit.There is no height or weight on file to calculate BMI.  General Appearance: NA  Eye Contact:  NA  Speech:  Clear and Coherent  Volume:  Normal  Mood:  Euthymic  Affect:  NA  Thought Process:  Goal Directed  Orientation:  Full (Time, Place, and Person)  Thought Content: WDL   Suicidal Thoughts:  No  Homicidal Thoughts:  No  Memory:  Immediate;   Good Recent;   Fair Remote;   NA  Judgement:  Good  Insight:  Fair  Psychomotor Activity:  Decreased  Concentration:   Concentration: Good and Attention Span: Good  Recall:  Good  Fund of Knowledge: Good  Language: Good  Akathisia:  No  Handed:  Right  AIMS (if indicated): not done  Assets:  Communication Skills Desire for Improvement Resilience Social Support Talents/Skills  ADL's:  Intact  Cognition: WNL  Sleep:  Good   Screenings: AUDIT    Flowsheet Row Clinical Support from 01/06/2021 in [facility]  Alcohol Use Disorder Identification Test Final Score (AUDIT) 4      MDI    Flowsheet Row Office Visit from 01/22/2016 in [facility]  Total Score (max 50) 34      Mini-Mental    Flowsheet Row Office Visit from 02/13/2015 in [facility]  Total Score (max 30 points ) 26      PHQ2-9    Flowsheet Row Video Visit from 02/15/2022 in [facility] Video Visit from 11/22/2021 in [facility] Management from 11/11/2021 in [facility] Office Visit from 11/09/2021 in [facility] Office Visit from 11/04/2021 in [facility]  PHQ-2 Total Score 0 2 0 0 3  PHQ-9 Total Score -- 9 -- 0 3      SBQ-R    [place] Visit from 01/22/2016 in [facility]  SBQ-R Total Score 14.1      Flowsheet Row Video Visit from 02/15/2022 in [facility] Video Visit from 11/22/2021 in [facility] Video Visit from 10/22/2021 in [facility] No Risk Error: Q3, 4, or 5 should not be populated when Q2 is No Error: Q3, 4, or 5 should not be populated when Q2 is No        Assessment and Plan: Patient is a 61 year old female with a history of depression anxiety and problems with focus memory and energy.  She is doing well on her  current regimen.  She will continue methylphenidate 20 mg twice daily for ADD, Effexor XR 150 mg daily for depression, trazodone 200 mg at bedtime for sleep and Xanax 1 mg 3 times daily for anxiety.  She will return to see me in 3 months  Collaboration of Care: Collaboration of Care: Primary Care Provider AEB notes are shared with PCP on the epic system  Patient/Guardian was advised Release of Information must be obtained prior to any record release in order to collaborate their care with an outside provider. Patient/Guardian was advised if they have not already done so to contact the registration department to sign all necessary forms in order for [place] to release information regarding their care.  Consent: Patient/Guardian gives verbal consent for treatment and assignment of benefits for services provided during this visit. Patient/Guardian expressed understanding and agreed to proceed.    [name], MD 02/15/2022, 1:25 PM

## 2022-02-16 ENCOUNTER — Encounter (HOSPITAL_COMMUNITY): Payer: Self-pay

## 2022-02-17 ENCOUNTER — Other Ambulatory Visit (HOSPITAL_COMMUNITY): Payer: Self-pay | Admitting: Psychiatry

## 2022-02-17 ENCOUNTER — Encounter: Payer: Self-pay | Admitting: Physical Therapy

## 2022-02-17 ENCOUNTER — Encounter: Payer: PPO | Admitting: Physical Therapy

## 2022-02-17 DIAGNOSIS — M6281 Muscle weakness (generalized): Secondary | ICD-10-CM

## 2022-02-17 DIAGNOSIS — M62838 Other muscle spasm: Secondary | ICD-10-CM

## 2022-02-17 DIAGNOSIS — R102 Pelvic and perineal pain: Secondary | ICD-10-CM

## 2022-02-17 MED ORDER — METHYLPHENIDATE HCL 20 MG PO TABS: 20.0000 mg | ORAL_TABLET | Freq: Two times a day (BID) | ORAL | 0 refills | Status: DC

## 2022-02-17 MED ORDER — METHYLPHENIDATE HCL 20 MG PO TABS
20.0000 mg | ORAL_TABLET | Freq: Two times a day (BID) | ORAL | 0 refills | Status: DC
Start: 2022-02-17 — End: 2022-04-28

## 2022-02-17 NOTE — Patient Instructions (Addendum)
Lubrication Used for intercourse to reduce friction Avoid ones that have glycerin, nonoxynol-9, petroleum, propylene glycol, chlorhexidine gluconate, warming gels, tingling gels, icing or cooling gel, scented Avoid parabens due to a preservative similar to female sex hormone May need to be reapplied once or several times during sexual activity Can be applied to both partners genitals prior to vaginal penetration to minimize friction or irritation Prevent irritation and mucosal tears that cause post coital pain and increased the risk of vaginal and urinary tract infections Oil-based lubricants cannot be used with condoms due to breaking them down.  Least likely to irritate vaginal tissue.  Plant based-lubes are safe Silicone-based lubrication are thicker and last long and used for post-menopausal women  Vaginal Lubricators Here is a list of some suggested lubricators you can use for intercourse. Use the most hypoallergenic product.  You can place on you or your partner.  Slippery Stuff ( water based) Sylk or Sliquid Natural H2O ( good  if frequent UTI's)- walmart, amazon Sliquid organics silk-(aloe and silicone based ) [employer] (www.blossom-organics.com)- (aloe based ) Coconut oil, olive oil -not good with condoms  PJur Woman Nude- (water based) amazon Uberlube- ( silicon) [place] has an organic one Yes lubricant- (water based and has plant oil based similar to silicone) [employer] Platinum-Silicone, [employer], [facility] Olive and Bee intimate cream-  www.oliveandbee.com.au Pink - [employer] Erosense Sync- walmart, amazon Coconu- coconu.com [place] [employer] to avoid in lubricants are glycerin, warming gels, tingling gels, icing or cooling  gels, and scented gels.  Also avoid Vaseline. KY jelly,  and Astroglide contain chlorhexidine which kills good bacteria(lactobacilli)  Things to avoid in the vaginal area Do not use things to irritate the  vulvar area No lotions- see below Soaps you  can use :Aveeno, [place], Good Clean Love cleanser if needed. Must be gentle No deodorants No douches Good to sleep without underwear to let the vaginal area to air out No scrubbing: spread the lips to let warm water rinse over labias and pat dry  Creams that can be used on the Vulva Area V [employer], [employer]mart Vital V [place] Salve Julva- [employer] Botanical Pro-Meno [place] Cream Coconut oil, olive oil Cleo by [employer] labial moisturizer -[place],  [place] Releveum ( lidocaine) or [place] Yes Moisturizer    Earlie Counts, PT [facility] [place], [place], [place] [address] W: [phone] [email or web address]   Soft tissue work to the perineal area Massage the outside of the perineal area working form the pubic bone done with a circular motion Place lubricated finger into the canal and press on the back and move finger back and ou Then swosh your finger on the sides 20 x each side.   PROTOCOL FOR VAGINAL DILATORS   Wash dilator with soap and water prior to insertion.    Lay on your back reclined. Knees are to be up and apart while on your bed or in the bathtub with warm water.   Lubricate the end of the dilator with a water-soluble lubricant.  Separate the labia.   Tense the pelvic floor muscles than relax; while relaxing, slide lubricated dilator( round side of dilator) into the vagina.  Insert toward the direction of your spine. Dilator should feel snug and no pain more than 3/10.   Tense muscles again while holding the dilator so it does not get pushed out; relax and slide it in a little further.   Try  blowing out as if filling a balloon; this may relax the muscles and allow penetration.  Repeat blowing out to insert dilator further.  Keep dilator in for 10 minutes if tolerate, with the pelvic floor muscles relaxed to further stretch the canal.   Never  force the dilator into the canal. Once the dilator is comfortable start to move in and out, side to side, move your hips in different directions  3-4 times per week Progression of dilator.  When you are able to place dilator into vaginal canal and feel no pain or able to move without difficulty you are ready for the next size.  Before you go to the next size start with the original size for 2 minutes then use the next size up for 5 minutes. When the next size up is easy to use, do not have to start with the smaller size.

## 2022-02-17 NOTE — Therapy (Signed)
OUTPATIENT PHYSICAL THERAPY TREATMENT NOTE   Patient Name: [name] MRN: [id number] DOB:1960/09/30, 61 y.o., female Today's Date: 02/17/2022  PCP: Lindell Spar, MD REFERRING PROVIDER: Jaquita Folds, MD  END OF SESSION:   PT End of Session - 02/17/22 0837     Visit Number 2   pelvic   Date for PT Re-Evaluation 05/05/22    Authorization Type [employer] (no auth, MCR guidelines)    Authorization - Visit Number 3    Authorization - Number of Visits 10    PT Start Time 0830    PT Stop Time 0915    PT Time Calculation (min) 45 min    Activity Tolerance Patient tolerated treatment well    Behavior During Therapy WFL for tasks assessed/performed             Past Medical History:  Diagnosis Date   Allergy    grass, dust , mold   Anxiety    Arthritis    Asthma due to seasonal allergies 06/15/2020   Bipolar disorder ([place])    Carpal tunnel syndrome    Bilateral   Chest pain 09/2011   Cardiac cath-normal coronaries   Constipation    Depression    Difficulty urinating 05/31/2013   Elevated LFTs 12/16/2013   GERD (gastroesophageal reflux disease)    History of kidney stones    Hyperlipemia    Hyperlipidemia    Hypertension    Mild; provoked by stress and anxiety   IBS (irritable bowel syndrome)    Intracerebral bleed ([facility])    No aneurysm; followed by Dr. Carolan Shiver replaced    "lense transplant" 2022; pt states lense don't dilate or constrict   Loss of weight 01/06/2015   Osteoporosis    Stage 3a chronic kidney disease ([place]) 03/16/2021   Stroke ([place]) 1999   hemorrhagic stroke; weakness of left side   Past Surgical History:  Procedure Laterality Date   ABDOMINAL HYSTERECTOMY     "cancer cells"   BIOPSY  04/16/2021   Procedure: BIOPSY;  Surgeon: Montez Morita, Quillian Quince, MD;  [facility]: [facility];  Service: Gastroenterology;;  small, bowel, esophageal(proximal and distal);   BRAIN SURGERY  1999   to remove blood clot after  stroke    CARDIAC CATHETERIZATION  2016   CERVICAL FUSION     CHOLECYSTECTOMY N/A 10/14/2014   Procedure: LAPAROSCOPIC CHOLECYSTECTOMY WITH INTRAOPERATIVE CHOLANGIOGRAM;  Surgeon: Jackolyn Confer, MD;  Location: [place];  Service: General;  Laterality: N/A;   CHONDROPLASTY Right 07/13/2017   Procedure: CHONDROPLASTY of patella;  Surgeon: Carole Civil, MD;  Location: [facility];  Service: Orthopedics;  Laterality: Right;   ESOPHAGEAL DILATION N/A 04/16/2021   Procedure: ESOPHAGEAL DILATION;  Surgeon: Harvel Quale, MD;  [facility];  Service: Gastroenterology;  Laterality: N/A;   ESOPHAGOGASTRODUODENOSCOPY (EGD) WITH PROPOFOL N/A 04/16/2021   Procedure: ESOPHAGOGASTRODUODENOSCOPY (EGD) WITH PROPOFOL;  Surgeon: Harvel Quale, MD;  [facility];  Service: Gastroenterology;  Laterality: N/A;  1:35, pt knows to arrive at 9:45   EUS N/A 08/21/2015   Procedure: ESOPHAGEAL ENDOSCOPIC ULTRASOUND (EUS) RADIAL;  Surgeon: Carol Ada, MD;  Location: [facility];  Service: Endoscopy;  Laterality: N/A;   KNEE ARTHROSCOPY WITH MEDIAL MENISECTOMY Right 07/13/2017   Procedure: KNEE ARTHROSCOPY WITH PARTIAL MEDIAL MENISECTOMY;  Surgeon: Carole Civil, MD;  Location: [facility];  Service: Orthopedics;  Laterality: Right;   LEFT HEART CATHETERIZATION WITH CORONARY ANGIOGRAM N/A 09/23/2011   Procedure: LEFT HEART  CATHETERIZATION WITH CORONARY ANGIOGRAM;  Surgeon: Thayer Headings, MD;  Location[facility];  Service: Cardiovascular;  Laterality: N/A;   LIPOMA EXCISION Left 11/18/2013   Procedure: EXCISION OF SOFT TISSUE MASS-LEFT THIGH;  Surgeon: Jamesetta So, MD;  Location: [facility];  Service: General;  Laterality: Left;   NASAL SEPTOPLASTY W/ TURBINOPLASTY Bilateral 08/30/2021   Procedure: NASAL SEPTOPLASTY WITH BILATERAL TURBINATE REDUCTION;  Surgeon: Leta Baptist, MD;  Location: [place];  Service: ENT;  Laterality: Bilateral;   POLYPECTOMY  04/16/2021    Procedure: POLYPECTOMY;  Surgeon: Harvel Quale, MD;  [facility];  Service: Gastroenterology;;  gastric   RECTOCELE REPAIR     x2   RECTOCELE REPAIR N/A 04/04/2017   Procedure: POSTERIOR REPAIR (RECTOCELE);  Surgeon: Jonnie Kind, MD;  Location: [facility];  Service: Gynecology;  Laterality: N/A;   Patient Active Problem List   Diagnosis Date Noted   Dystrophia unguium 02/01/2022   Lumbar compression fracture ([place]) 12/23/2021   Lumbar spondylosis 11/04/2021   Encounter for general adult medical examination with abnormal findings 07/07/2021   Gastric polyp 05/31/2021   NASH (nonalcoholic steatohepatitis) 04/01/2021   Dysphagia 04/01/2021   Stage 3a chronic kidney disease ([place]) 03/16/2021   Breast pain 03/02/2021   Arthritis 01/31/2021   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   History of hemorrhagic stroke with residual hemiplegia ([place]) 06/15/2020   Urinary incontinence 06/15/2020   Asthma due to seasonal allergies 06/15/2020   Vaginal itching 03/16/2020   S/P right knee arthroscopy 07/13/17 07/20/2017   Derangement of posterior horn of medial meniscus of right knee    Chondromalacia, patella, right    IBS (irritable bowel syndrome) 05/11/2015   Gait instability 01/06/2015   DDD (degenerative disc disease), lumbar 02/18/2014   Postmenopausal atrophic vaginitis 11/01/2013   Lipoma of abdominal wall 08/14/2013   Back pain 05/29/2013   Paresthesia of foot 02/12/2013   Hypertension    GERD (gastroesophageal reflux disease)    Depression 11/17/2011   Insomnia 11/17/2011   Mixed hyperlipidemia 09/20/2011   Anxiety 09/20/2011   REFERRING PROVIDER: Jaquita Folds, MD   REFERRING DIAG:  N94.10 (ICD-10-CM) - Dyspareunia, female  [phone] (ICD-10-CM) - Levator spasm      THERAPY DIAG:  Muscle weakness (generalized)   Other muscle spasm   Pelvic pain   Rationale for Evaluation and Treatment Rehabilitation   ONSET DATE: 2013   SUBJECTIVE:  SUBJECTIVE STATEMENT: I was comfortable with you.     PAIN:  Are you having pain? Yes NPRS scale: 9/10 dilator for initial penetration and no pain after it goes further Pain location: Vaginal   Pain type: sharp and tight Pain description: intermittent    Aggravating factors: using the dilators Relieving factors: no dilator or vaginal penetration   PRECAUTIONS: Other: osteoporosis   WEIGHT BEARING RESTRICTIONS No   FALLS:  Has patient fallen in last 6 months? Yes. Number of falls 1 time and she is attending therapy for back and the balance   LIVING ENVIRONMENT: Lives with: lives with their spouse   OCCUPATION: disable   PLOF: Independent   PATIENT GOALS  be able to have penile penetration   PERTINENT HISTORY:  Hysterectomy; osteoporosis; IBS; Stage 3a chronic kidney disease; stroke; rectocele repair x 3 Sexual abuse: No   BOWEL MOVEMENT Pain with bowel movement: Yes Type of bowel movement:Type (Bristol Stool Scale) Type 7 or Type 4 wit fiber, Frequency 1 time per day unless she has diarrhea then multiple times, Strain Yes, and Splinting no Fully empty rectum: Yes: not all of the time.  Leakage: No Pads: No Fiber supplement: Yes: taking fiber   URINATION Pain with urination: No Fully empty bladder: Yes: not sure  Stream: Strong Urgency: Yes: sometimes feels she has to go to the bathroom but no urine comes out.  Frequency: 2-3 times per day, 2 times per night Leakage: Urge to void and Walking to the bathroom Pads: Yes: changes every time she goes to the bathroom   INTERCOURSE Pain with intercourse: Initial Penetration Ability to have vaginal penetration:  No since the rectocele surgery Marinoff Scale: 3/3 T rouble with orgasm     OBJECTIVE:    DIAGNOSTIC FINDINGS:  PVR of 101 ml was  obtained by bladder scan.     PATIENT SURVEYS:  none     COGNITION:            Overall cognitive status: Within functional limits for tasks assessed ; She has short term memory loss from stroke.                   SENSATION:            Light touch: Appears intact            Proprioception: Appears intact                      POSTURE: No Significant postural limitations               PELVIC ALIGNMENT:     LOWER EXTREMITY NID:[id number] hip ROM is full       LOWER EXTREMITY MMT:   MMT Right eval Left eval  Hip flexion 4/5 4/5  Hip extension 4/5 4/5  Hip abduction 4/5 4/5  Hip adduction 4/5 4/5     PALPATION:   General  tightness in bilateral hip adductors                 External Perineal Exam decreased mobility of the urogenital daphragm                             Internal Pelvic Floor tenderness in the posterior vaginal canal, along the introitus, levator ani and obturator internist   Patient confirms identification and approves PT to assess internal pelvic floor and treatment Yes   PELVIC MMT:  MMT eval  Vaginal 2/5  (Blank rows = not tested)         TONE: increased   PROLAPSE: none   TODAY'S TREATMENT  02/17/2022 Manual: Soft tissue mobilization:massage the vulvar area and urogenitial diaphragm then showed husband what to do and he returned demonstration.  Internal pelvic floor techniques:No emotional/communication barriers or cognitive limitation. Patient is motivated to learn. Patient understands and agrees with treatment goals and plan. PT explains patient will be examined in standing, sitting, and lying down to see how their muscles and joints work. When they are ready, they will be asked to remove their underwear so PT can examine their perineum. The patient is also given the option of providing their own chaperone as one is not provided in our facility. The patient also has the right and is explained the right to defer or refuse any part of the  evaluation or treatment including the internal exam. With the patient's consent, PT will use one gloved finger to gently assess the muscles of the pelvic floor, seeing how well it contracts and relaxes and if there is muscle symmetry. After, the patient will get dressed and PT and patient will discuss exam findings and plan of care. PT and patient discuss plan of care, schedule, attendance policy and HEP activities.  Therapist placed her finger into the vaginal canal to work on the post. Vaginal canal and show her husband on how to work the pelvic floor muscles with correct pressure Therapist had husband place blue dilator into the vaginal canal as she explained how to insert the dilator and move it around to stretch the tissue Self-care: Educated and husband on using water based lubricant with dilators.     EVAL Date: 02/10/2022 HEP established-see below     PATIENT EDUCATION: 02/17/2022 Education details: how to use the dilator, how to massage the perineal area, introitus and post vaginal canal Person educated: Patient and Spouse Education method: Explanation, Demonstration, Tactile cues, Verbal cues, and Handouts Education comprehension: verbalized understanding, returned demonstration, verbal cues required, tactile cues required, and needs further education    HOME EXERCISE PROGRAM: See above.    ASSESSMENT:   CLINICAL IMPRESSION: Patient would benefit from skilled therapy to improve pelvic floor pain and reduce leakage. Patient husband was educated on how to perform manual work on the patients perineum. Patient husband was educated on how to insert the dilator into her vagina with no more than 3/10 pain and move it around to expand the vaginal canal. She did not have more than 2/10 pain. Paitient will benefit from skilled therapy to work on tissue expansion of the vagina for penetration and increase strength to reduce leakage.      OBJECTIVE IMPAIRMENTS decreased activity tolerance,  decreased endurance, decreased strength, increased fascial restrictions, impaired tone, and pain.    ACTIVITY LIMITATIONS continence and toileting   PARTICIPATION LIMITATIONS: interpersonal relationship and community activity   PERSONAL FACTORS Hysterectomy; osteoporosis; IBS; Stage 3a chronic kidney disease; stroke; rectocele repair x 3 are also affecting patient's functional outcome.    REHAB POTENTIAL: Excellent   CLINICAL DECISION MAKING: Stable/uncomplicated   EVALUATION COMPLEXITY: Moderate     GOALS: Goals reviewed with patient? Yes   SHORT TERM GOALS: Target date: 03/10/2022   Patient and husband were instructed on how to perform manual work to the pelvic floor to improve tissue mobility.  Baseline: Goal status: Met 02/17/2022   2.  Patient and her husband were educated on how to use the dilator vaginally  so she is not having pain >/= 3/10.  Baseline:  Goal status: Met 02/17/2022   3.  Patient is able to perform diaphragmatic breathing to relax the pelvic floor and able to bulge the pelvic floor.  Baseline:  Goal status: INITIAL     LONG TERM GOALS: Target date: 05/05/2022    Patient is independent with advanced HEP for the pelvic floor to reduce leakage.  Baseline:  Goal status: INITIAL   2.  Patient is able to use the largest dilator due to improved mobility of the vaginal tissue and her ability to relax the pelvic floor.  Baseline:  Goal status: INITIAL   3.  Patient is able to have the urge to urinate and urine is able to come out of the urethra due to improved relaxation of the pelvic floor.  Baseline:  Goal status: INITIAL   4.  Patient is able to have penile penetration with pain level </= 1-2/10 due to improved tissue lengthening.  Baseline:  Goal status: INITIAL   5.  Pelvic floor strength is 3/5 so she is able to reduce her urinary leakage >/= 80%.  Baseline:  Goal status: INITIAL       PLAN: PT FREQUENCY: 1x/week   PT DURATION: 12 weeks    PLANNED INTERVENTIONS: Therapeutic exercises, Therapeutic activity, Neuromuscular re-education, Balance training, Self Care, Dry Needling, Electrical stimulation, Cryotherapy, Moist heat, Taping, Biofeedback, and Manual therapy   PLAN FOR NEXT SESSION: review the use of the dilator and see if needs the next size, manual work to abdomen, diaphragmatic breathing, therapist performing manual work to the pelvic floor    Earlie Counts, PT 02/17/22 11:36 AM

## 2022-02-23 ENCOUNTER — Ambulatory Visit (HOSPITAL_COMMUNITY): Payer: PPO

## 2022-02-23 ENCOUNTER — Encounter (HOSPITAL_COMMUNITY): Payer: Self-pay

## 2022-02-23 DIAGNOSIS — M5459 Other low back pain: Secondary | ICD-10-CM | POA: Diagnosis not present

## 2022-02-23 DIAGNOSIS — R2689 Other abnormalities of gait and mobility: Secondary | ICD-10-CM

## 2022-02-23 DIAGNOSIS — R29898 Other symptoms and signs involving the musculoskeletal system: Secondary | ICD-10-CM

## 2022-02-23 DIAGNOSIS — M62838 Other muscle spasm: Secondary | ICD-10-CM

## 2022-02-23 DIAGNOSIS — M6281 Muscle weakness (generalized): Secondary | ICD-10-CM

## 2022-02-23 NOTE — Therapy (Addendum)
OUTPATIENT PHYSICAL THERAPY THORACOLUMBAR TREATMENT   Patient Name: [name] MRN: [id number] DOB:14-Sep-1960, 60 y.o., female Today's Date: 02/23/2022   PT End of Session - 02/23/22 1459     Visit Number 2    Number of Visits 6    Date for PT Re-Evaluation 05/05/22    Authorization Type [employer] (no auth, MCR guidelines)    Authorization - Visit Number 2    Authorization - Number of Visits 6    PT Start Time 3976   late sign in   PT Stop Time 1523    PT Time Calculation (min) 38 min    Activity Tolerance Patient tolerated treatment well    Behavior During Therapy WFL for tasks assessed/performed              Past Medical History:  Diagnosis Date   Allergy    grass, dust , mold   Anxiety    Arthritis    Asthma due to seasonal allergies 06/15/2020   Bipolar disorder ([place])    Carpal tunnel syndrome    Bilateral   Chest pain 09/2011   Cardiac cath-normal coronaries   Constipation    Depression    Difficulty urinating 05/31/2013   Elevated LFTs 12/16/2013   GERD (gastroesophageal reflux disease)    History of kidney stones    Hyperlipemia    Hyperlipidemia    Hypertension    Mild; provoked by stress and anxiety   IBS (irritable bowel syndrome)    Intracerebral bleed (H[facility])    No aneurysm; followed by Dr. Carolan Shiver replaced    "lense transplant" 2022; pt states lense don't dilate or constrict   Loss of weight 01/06/2015   Osteoporosis    Stage 3a chronic kidney disease ([place]) 03/16/2021   Stroke ([place]) 1999   hemorrhagic stroke; weakness of left side   Past Surgical History:  Procedure Laterality Date   ABDOMINAL HYSTERECTOMY     "cancer cells"   BIOPSY  04/16/2021   Procedure: BIOPSY;  Surgeon: Montez Morita, Quillian Quince, MD;  Location: [facility];  Service: Gastroenterology;;  small, bowel, esophageal(proximal and distal);   [place]   to remove blood clot after stroke    CARDIAC CATHETERIZATION  2016   CERVICAL  FUSION     CHOLECYSTECTOMY N/A 10/14/2014   Procedure: LAPAROSCOPIC CHOLECYSTECTOMY WITH INTRAOPERATIVE CHOLANGIOGRAM;  Surgeon: Jackolyn Confer, MD;  Location: [facility];  Service: General;  Laterality: N/A;   CHONDROPLASTY Right 07/13/2017   Procedure: CHONDROPLASTY of patella;  Surgeon: Carole Civil, MD;  Location: [facility];  Service: Orthopedics;  Laterality: Right;   ESOPHAGEAL DILATION N/A 04/16/2021   Procedure: ESOPHAGEAL DILATION;  Surgeon: Harvel Quale, MD;  [facility];  Service: Gastroenterology;  Laterality: N/A;   ESOPHAGOGASTRODUODENOSCOPY (EGD) WITH PROPOFOL N/A 04/16/2021   Procedure: ESOPHAGOGASTRODUODENOSCOPY (EGD) WITH PROPOFOL;  Surgeon: Harvel Quale, MD;  [facility];  Service: Gastroenterology;  Laterality: N/A;  1:35, pt knows to arrive at 9:45   EUS N/A 08/21/2015   Procedure: ESOPHAGEAL ENDOSCOPIC ULTRASOUND (EUS) RADIAL;  Surgeon: Carol Ada, MD;  Location: [facility];  Service: Endoscopy;  Laterality: N/A;   KNEE ARTHROSCOPY WITH MEDIAL MENISECTOMY Right 07/13/2017   Procedure: KNEE ARTHROSCOPY WITH PARTIAL MEDIAL MENISECTOMY;  Surgeon: Carole Civil, MD;  Location: [facility];  Service: Orthopedics;  Laterality: Right;   LEFT HEART CATHETERIZATION WITH CORONARY ANGIOGRAM N/A 09/23/2011   Procedure: LEFT HEART CATHETERIZATION WITH CORONARY ANGIOGRAM;  Surgeon:  Thayer Headings, MD;  Location: [facility];  Service: Cardiovascular;  Laterality: N/A;   LIPOMA EXCISION Left 11/18/2013   Procedure: EXCISION OF SOFT TISSUE MASS-LEFT THIGH;  Surgeon: Jamesetta So, MD;  Location: [facility];  Service: General;  Laterality: Left;   NASAL SEPTOPLASTY W/ TURBINOPLASTY Bilateral 08/30/2021   Procedure: NASAL SEPTOPLASTY WITH BILATERAL TURBINATE REDUCTION;  Surgeon: Leta Baptist, MD;  Location: [facility];  Service: ENT;  Laterality: Bilateral;   POLYPECTOMY  04/16/2021   Procedure: POLYPECTOMY;  Surgeon: Harvel Quale, MD;  [facility];  Service: Gastroenterology;;  gastric   RECTOCELE REPAIR     x2   RECTOCELE REPAIR N/A 04/04/2017   Procedure: POSTERIOR REPAIR (RECTOCELE);  Surgeon: Jonnie Kind, MD;  Location: [facility];  Service: Gynecology;  Laterality: N/A;   Patient Active Problem List   Diagnosis Date Noted   Dystrophia unguium 02/01/2022   Lumbar compression fracture ([place]) 12/23/2021   Lumbar spondylosis 11/04/2021   Encounter for general adult medical examination with abnormal findings 07/07/2021   Gastric polyp 05/31/2021   NASH (nonalcoholic steatohepatitis) 04/01/2021   Dysphagia 04/01/2021   Stage 3a chronic kidney disease ([place]) 03/16/2021   Breast pain 03/02/2021   Arthritis 01/31/2021   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   History of hemorrhagic stroke with residual hemiplegia ([place]) 06/15/2020   Urinary incontinence 06/15/2020   Asthma due to seasonal allergies 06/15/2020   Vaginal itching 03/16/2020   S/P right knee arthroscopy 07/13/17 07/20/2017   Derangement of posterior horn of medial meniscus of right knee    Chondromalacia, patella, right    IBS (irritable bowel syndrome) 05/11/2015   Gait instability 01/06/2015   DDD (degenerative disc disease), lumbar 02/18/2014   Postmenopausal atrophic vaginitis 11/01/2013   Lipoma of abdominal wall 08/14/2013   Back pain 05/29/2013   Paresthesia of foot 02/12/2013   Hypertension    GERD (gastroesophageal reflux disease)    Depression 11/17/2011   Insomnia 11/17/2011   Mixed hyperlipidemia 09/20/2011   Anxiety 09/20/2011    PCP: Ihor Dow MD  REFERRING PROVIDER: Carole Civil, MD   REFERRING DIAG: M54.50,G89.29 (ICD-10-CM) - Chronic midline low back pain, unspecified whether sciatica present S32.010S (ICD-10-CM) - Compression fracture of L1 vertebra, sequela M51.36 (ICD-10-CM) - DDD (degenerative disc disease), lumbar M54.42,G89.29 (ICD-10-CM) - Chronic left-sided low back pain with left-sided sciatica    Rationale for Evaluation and Treatment Rehabilitation  THERAPY DIAG:  Muscle weakness (generalized)  Other muscle spasm  Other low back pain  Other abnormalities of gait and mobility  Other symptoms and signs involving the musculoskeletal system  ONSET DATE: Chronic  SUBJECTIVE:  SUBJECTIVE STATEMENT:  Pt stated she has lower back pain scale 4/10.  Reports she has began HEP, stated more difficult to complete on bed and dog gets her on when completing on floor.  Stated she has been standing peeling potatos with increased pain following.  Pt questions whether to wear back brace when picking weeds outside.  Reports she has history of CVA and has had 3 falls this year.  Stated she does walk with walking stick when walking for long periods of time.    Eval subjective: Patient states she has arthritis in her back. She has been doing acupuncture but hasn't noticed much change. Standing at kitchen sink makes her back really hurt. She had PT in past but did not have any relief. She had chiro in past and they did heat and estim. She likes to work out in the yard. Symptoms in back have gotten worse since fracture in 2017. Her balance is not good. She was going to come in a couple months ago but fell and rescheduled. Has different braces she can use when doing yard work.    PERTINENT HISTORY:  Chronic LBP, CKD, hx bipolar disorder, HTN, HLD, IBS, osteoporosis, hx CVA, R knee pain  PAIN:  Are you having pain? Yes: NPRS scale: 4/10 Pain location: low back Pain description: achy Aggravating factors: standing at sink, vacuuming, mopping, lifting Relieving factors: none   PRECAUTIONS: None  WEIGHT BEARING RESTRICTIONS No  FALLS:  Has patient fallen in last 6 months? Yes. Number of falls 1  LIVING  ENVIRONMENT: Lives with: lives with their spouse Lives in: Mobile home Stairs: Yes: External: 6 steps; on right going up Has following equipment at home: None  OCCUPATION: Disability  PLOF: Independent  PATIENT GOALS decrease pain   OBJECTIVE:   DIAGNOSTIC FINDINGS:  MRI 12/13/21 IMPRESSION: 1. Limited degenerative changes with no neural impingement. 2. Remote L1 superior endplate fracture, also seen in 2017.  PATIENT SURVEYS:  FOTO not uploaded - complete next session  SCREENING FOR RED FLAGS: Bowel or bladder incontinence: No Spinal tumors: No Cauda equina syndrome: No Compression fracture: Yes: hx L1 compression fracture 2017 Abdominal aneurysm: No  COGNITION:  Overall cognitive status: Within functional limits for tasks assessed     SENSATION: WFL   POSTURE: rounded shoulders, forward head, and decreased lumbar lordosis  PALPATION: Decreased lumbar lordosis  LUMBAR ROM:   Active  A/PROM  eval  Flexion 0% limited *  Extension 25% limited*  Right lateral flexion 25% limited *   Left lateral flexion 25% limited*  Right rotation 25% limited  Left rotation 25% limited   (Blank rows = not tested)*=pain  LOWER EXTREMITY ROM:   WFL for tasks assessed  Active  Right eval Left eval  Hip flexion    Hip extension    Hip abduction    Hip adduction    Hip internal rotation    Hip external rotation    Knee flexion    Knee extension    Ankle dorsiflexion    Ankle plantarflexion    Ankle inversion    Ankle eversion     (Blank rows = not tested)  LOWER EXTREMITY MMT:    MMT Right eval Left eval  Hip flexion 4+ 4  Hip extension 4 * 4 *  Hip abduction 4+ 4-  Hip adduction    Hip internal rotation    Hip external rotation    Knee flexion 4+ 4+  Knee extension 4+ 4+  Ankle dorsiflexion 5  5  Ankle plantarflexion    Ankle inversion    Ankle eversion     (Blank rows = not tested) *= pain    FUNCTIONAL TESTS:  5 times sit to stand: 12.62 seconds  without UE support 2 minute walk test: 325 feet  GAIT: Distance walked: 325 feet Assistive device utilized: None Level of assistance: Complete Independence Comments: 2MWT, slow, increasing LBP at 1:40, decreased foot clearance bilaterally     TODAY'S TREATMENT  02/23/22: Reviewed goals FOTO Bridge Ab set paired with breathing Marching with ab set  02/07/22 Bridge 2x 10    PATIENT EDUCATION:  Education details: Patient educated on exam findings, POC, scope of PT, HEP. Person educated: Patient Education method: Explanation, Demonstration, and Handouts Education comprehension: verbalized understanding, returned demonstration, verbal cues required, and tactile cues required   HOME EXERCISE PROGRAM: Access Code: [email or web address] URL: [email or web address] Date: 02/07/2022 - Supine Bridge  - 2 x daily - 7 x weekly - 2 sets - 10 reps 02/23/22 Supine march 10x5"  ASSESSMENT:  CLINICAL IMPRESSION: Reviewed goals, educated importance of HEP compliance for maximal benefits.  Pt able to recall and demonstrate appropriate mechanics with current HEP.  FOTO complete with score 49.6% indicating increased difficulty with functional tasks.  Session focus on core and proximal strengthening.  Pt educated on paired exhalation with exerction to reduce holding breath through session.  EOS pt reports dull ache, no report of increased pain.     OBJECTIVE IMPAIRMENTS decreased activity tolerance, decreased endurance, decreased mobility, difficulty walking, decreased ROM, decreased strength, increased muscle spasms, impaired flexibility, improper body mechanics, postural dysfunction, and pain.   ACTIVITY LIMITATIONS carrying, lifting, bending, standing, squatting, stairs, transfers, locomotion level, and caring for others  PARTICIPATION LIMITATIONS: meal prep, cleaning, laundry, shopping, community activity, and yard work  PERSONAL FACTORS Time since onset of injury/illness/exacerbation and  3+ comorbidities: Chronic LBP, CKD, hx bipolar disorder, HTN, HLD, IBS, osteoporosis, hx CVA, R knee pain  are also affecting patient's functional outcome.   REHAB POTENTIAL: Good  CLINICAL DECISION MAKING: Stable/uncomplicated  EVALUATION COMPLEXITY: Low   GOALS: Goals reviewed with patient? Yes  SHORT TERM GOALS: Target date: 02/28/2022  Patient will be independent with HEP in order to improve functional outcomes. Baseline:  Goal status: IN PROGRESS  2.  Patient will report at least 25% improvement in symptoms for improved quality of life. Baseline:  Goal status: IN PROGRESS   LONG TERM GOALS: Target date: 03/21/2022  Patient will report at least 75% improvement in symptoms for improved quality of life. Baseline:  Goal status: IN PROGRESS  2.  Patient will demonstrate grade of 5/5 MMT grade in all tested musculature as evidence of improved strength to assist with stair ambulation and gait.   Baseline: see MMT Goal status: IN PROGRESS  3.  Patient will demonstrate at least 25% improvement in lumbar ROM in all restricted planes for improved ability to move trunk while completing chores. Baseline: see AROM Goal status: IN PROGRESS  4.  Patient will be able to ambulate at least 425 feet in 2MWT in order to demonstrate improved tolerance to activity. Baseline: 02/07/22 325 feet Goal status: IN PROGRESS  5.  Patient will be able to complete 5x STS in under 11 seconds in order to reduce the risk of falls. Baseline: 02/07/22 12.62 seconds Goal status: IN PROGRESS     PLAN: PT FREQUENCY: 1x/week  PT DURATION: 6 weeks  PLANNED INTERVENTIONS: Therapeutic exercises, Therapeutic activity, Neuromuscular re-education, Balance training, Gait training, Patient/Family education,  Joint manipulation, Joint mobilization, Stair training, Orthotic/Fit training, DME instructions, Aquatic Therapy, Dry Needling, Electrical stimulation, Spinal manipulation, Spinal mobilization, Cryotherapy,  Moist heat, Compression bandaging, scar mobilization, Splintting, Taping, Traction, Ultrasound, Ionotophoresis 83m/ml Dexamethasone, and Manual therapy  PLAN FOR NEXT SESSION:  f/u with HEP, core/glute strength, postural strengthening  CIhor Austin LPTA/CLT; CBIS [phone] CAldona Lento PTA 02/23/2022, 5:53 PM

## 2022-02-24 ENCOUNTER — Encounter: Payer: Self-pay | Admitting: Orthopedic Surgery

## 2022-02-26 ENCOUNTER — Encounter: Payer: Self-pay | Admitting: Orthopaedic Surgery

## 2022-02-28 ENCOUNTER — Other Ambulatory Visit: Payer: Self-pay | Admitting: Orthopaedic Surgery

## 2022-03-01 ENCOUNTER — Encounter (HOSPITAL_COMMUNITY): Payer: Self-pay

## 2022-03-01 ENCOUNTER — Ambulatory Visit (HOSPITAL_COMMUNITY): Payer: PPO

## 2022-03-01 DIAGNOSIS — R2689 Other abnormalities of gait and mobility: Secondary | ICD-10-CM

## 2022-03-01 DIAGNOSIS — M6281 Muscle weakness (generalized): Secondary | ICD-10-CM

## 2022-03-01 DIAGNOSIS — M5459 Other low back pain: Secondary | ICD-10-CM

## 2022-03-01 DIAGNOSIS — M62838 Other muscle spasm: Secondary | ICD-10-CM

## 2022-03-01 NOTE — Therapy (Signed)
OUTPATIENT PHYSICAL THERAPY THORACOLUMBAR TREATMENT   Patient Name: [name] MRN: [id number] DOB:03-10-1961, 61 y.o., female Today's Date: 03/01/2022   PT End of Session - 03/01/22 1358     Visit Number 3    Number of Visits 6    Date for PT Re-Evaluation 05/05/22    Authorization Type [employer] (no auth, MCR guidelines)    Authorization - Visit Number 3    Authorization - Number of Visits 6    PT Start Time 5784    PT Stop Time 6962    PT Time Calculation (min) 39 min    Activity Tolerance Patient tolerated treatment well    Behavior During Therapy WFL for tasks assessed/performed               Past Medical History:  Diagnosis Date   Allergy    grass, dust , mold   Anxiety    Arthritis    Asthma due to seasonal allergies 06/15/2020   Bipolar disorder ([place])    Carpal tunnel syndrome    Bilateral   Chest pain 09/2011   Cardiac cath-normal coronaries   Constipation    Depression    Difficulty urinating 05/31/2013   Elevated LFTs 12/16/2013   GERD (gastroesophageal reflux disease)    History of kidney stones    Hyperlipemia    Hyperlipidemia    Hypertension    Mild; provoked by stress and anxiety   IBS (irritable bowel syndrome)    Intracerebral bleed ([facility])    No aneurysm; followed by Dr. Carolan Shiver replaced    "lense transplant" 2022; pt states lense don't dilate or constrict   Loss of weight 01/06/2015   Osteoporosis    Stage 3a chronic kidney disease ([place]) 03/16/2021   Stroke ([place]) 1999   hemorrhagic stroke; weakness of left side   Past Surgical History:  Procedure Laterality Date   ABDOMINAL HYSTERECTOMY     "cancer cells"   BIOPSY  04/16/2021   Procedure: BIOPSY;  Surgeon: Montez Morita, Quillian Quince, MD;  Location: [facility];  Service: Gastroenterology;;  small, bowel, esophageal(proximal and distal);   BRAIN SURGERY  1999   to remove blood clot after stroke    CARDIAC CATHETERIZATION  2016   CERVICAL FUSION      CHOLECYSTECTOMY N/A 10/14/2014   Procedure: LAPAROSCOPIC CHOLECYSTECTOMY WITH INTRAOPERATIVE CHOLANGIOGRAM;  Surgeon: Jackolyn Confer, MD;  Location: [place];  Service: General;  Laterality: N/A;   CHONDROPLASTY Right 07/13/2017   Procedure: CHONDROPLASTY of patella;  Surgeon: Carole Civil, MD;  Location: [facility];  Service: Orthopedics;  Laterality: Right;   ESOPHAGEAL DILATION N/A 04/16/2021   Procedure: ESOPHAGEAL DILATION;  Surgeon: Harvel Quale, MD;  [facility];  Service: Gastroenterology;  Laterality: N/A;   ESOPHAGOGASTRODUODENOSCOPY (EGD) WITH PROPOFOL N/A 04/16/2021   Procedure: ESOPHAGOGASTRODUODENOSCOPY (EGD) WITH PROPOFOL;  Surgeon: Harvel Quale, MD;  [facility];  Service: Gastroenterology;  Laterality: N/A;  1:35, pt knows to arrive at 9:45   EUS N/A 08/21/2015   Procedure: ESOPHAGEAL ENDOSCOPIC ULTRASOUND (EUS) RADIAL;  Surgeon: Carol Ada, MD;  Location: [facility];  Service: Endoscopy;  Laterality: N/A;   KNEE ARTHROSCOPY WITH MEDIAL MENISECTOMY Right 07/13/2017   Procedure: KNEE ARTHROSCOPY WITH PARTIAL MEDIAL MENISECTOMY;  Surgeon: Carole Civil, MD;  Location: [facility];  Service: Orthopedics;  Laterality: Right;   LEFT HEART CATHETERIZATION WITH CORONARY ANGIOGRAM N/A 09/23/2011   Procedure: LEFT HEART CATHETERIZATION WITH CORONARY ANGIOGRAM;  Surgeon: Wonda Cheng Nahser,  MD;  Location: [facility];  Service: Cardiovascular;  Laterality: N/A;   LIPOMA EXCISION Left 11/18/2013   Procedure: EXCISION OF SOFT TISSUE MASS-LEFT THIGH;  Surgeon: Jamesetta So, MD;  Location: [facility];  Service: General;  Laterality: Left;   NASAL SEPTOPLASTY W/ TURBINOPLASTY Bilateral 08/30/2021   Procedure: NASAL SEPTOPLASTY WITH BILATERAL TURBINATE REDUCTION;  Surgeon: Leta Baptist, MD;  Location: [place];  Service: ENT;  Laterality: Bilateral;   POLYPECTOMY  04/16/2021   Procedure: POLYPECTOMY;  Surgeon: Harvel Quale, MD;   [facility];  Service: Gastroenterology;;  gastric   RECTOCELE REPAIR     x2   RECTOCELE REPAIR N/A 04/04/2017   Procedure: POSTERIOR REPAIR (RECTOCELE);  Surgeon: Jonnie Kind, MD;  Location: [facility];  Service: Gynecology;  Laterality: N/A;   Patient Active Problem List   Diagnosis Date Noted   Dystrophia unguium 02/01/2022   Lumbar compression fracture ([place]) 12/23/2021   Lumbar spondylosis 11/04/2021   Encounter for general adult medical examination with abnormal findings 07/07/2021   Gastric polyp 05/31/2021   NASH (nonalcoholic steatohepatitis) 04/01/2021   Dysphagia 04/01/2021   Stage 3a chronic kidney disease ([place]) 03/16/2021   Breast pain 03/02/2021   Arthritis 01/31/2021   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   History of hemorrhagic stroke with residual hemiplegia ([place]) 06/15/2020   Urinary incontinence 06/15/2020   Asthma due to seasonal allergies 06/15/2020   Vaginal itching 03/16/2020   S/P right knee arthroscopy 07/13/17 07/20/2017   Derangement of posterior horn of medial meniscus of right knee    Chondromalacia, patella, right    IBS (irritable bowel syndrome) 05/11/2015   Gait instability 01/06/2015   DDD (degenerative disc disease), lumbar 02/18/2014   Postmenopausal atrophic vaginitis 11/01/2013   Lipoma of abdominal wall 08/14/2013   Back pain 05/29/2013   Paresthesia of foot 02/12/2013   Hypertension    GERD (gastroesophageal reflux disease)    Depression 11/17/2011   Insomnia 11/17/2011   Mixed hyperlipidemia 09/20/2011   Anxiety 09/20/2011    PCP: Ihor Dow MD  REFERRING PROVIDER: Carole Civil, MD   REFERRING DIAG: M54.50,G89.29 (ICD-10-CM) - Chronic midline low back pain, unspecified whether sciatica present S32.010S (ICD-10-CM) - Compression fracture of L1 vertebra, sequela M51.36 (ICD-10-CM) - DDD (degenerative disc disease), lumbar M54.42,G89.29 (ICD-10-CM) - Chronic left-sided low back pain with left-sided sciatica   Rationale  for Evaluation and Treatment Rehabilitation  THERAPY DIAG:  Muscle weakness (generalized)  Other muscle spasm  Other low back pain  Other abnormalities of gait and mobility  ONSET DATE: Chronic  SUBJECTIVE:  SUBJECTIVE STATEMENT: Pt stated she is having more GI issues than back pain today.  Reports she has been complaint with HEP, has difficulty with pairing breathing.  Arrived with husband Sonia Side.  Eval subjective: Patient states she has arthritis in her back. She has been doing acupuncture but hasn't noticed much change. Standing at kitchen sink makes her back really hurt. She had PT in past but did not have any relief. She had chiro in past and they did heat and estim. She likes to work out in the yard. Symptoms in back have gotten worse since fracture in 2017. Her balance is not good. She was going to come in a couple months ago but fell and rescheduled. Has different braces she can use when doing yard work.    PERTINENT HISTORY:  Chronic LBP, CKD, hx bipolar disorder, HTN, HLD, IBS, osteoporosis, hx CVA, R knee pain  PAIN:  Are you having pain? Yes: NPRS scale: no pain besides cramping due to GI issues 0/10 Pain location: low back Pain description: achy Aggravating factors: standing at sink, vacuuming, mopping, lifting Relieving factors: none   PRECAUTIONS: None  WEIGHT BEARING RESTRICTIONS No  FALLS:  Has patient fallen in last 6 months? Yes. Number of falls 1  LIVING ENVIRONMENT: Lives with: lives with their spouse Lives in: Mobile home Stairs: Yes: External: 6 steps; on right going up Has following equipment at home: None  OCCUPATION: Disability  PLOF: Independent  PATIENT GOALS decrease pain   OBJECTIVE:   DIAGNOSTIC FINDINGS:  MRI 12/13/21 IMPRESSION: 1. Limited  degenerative changes with no neural impingement. 2. Remote L1 superior endplate fracture, also seen in 2017.  PATIENT SURVEYS:  FOTO not uploaded - complete next session  SCREENING FOR RED FLAGS: Bowel or bladder incontinence: No Spinal tumors: No Cauda equina syndrome: No Compression fracture: Yes: hx L1 compression fracture 2017 Abdominal aneurysm: No  COGNITION:  Overall cognitive status: Within functional limits for tasks assessed     SENSATION: WFL   POSTURE: rounded shoulders, forward head, and decreased lumbar lordosis  PALPATION: Decreased lumbar lordosis  LUMBAR ROM:   Active  A/PROM  eval  Flexion 0% limited *  Extension 25% limited*  Right lateral flexion 25% limited *   Left lateral flexion 25% limited*  Right rotation 25% limited  Left rotation 25% limited   (Blank rows = not tested)*=pain  LOWER EXTREMITY ROM:   WFL for tasks assessed  Active  Right eval Left eval  Hip flexion    Hip extension    Hip abduction    Hip adduction    Hip internal rotation    Hip external rotation    Knee flexion    Knee extension    Ankle dorsiflexion    Ankle plantarflexion    Ankle inversion    Ankle eversion     (Blank rows = not tested)  LOWER EXTREMITY MMT:    MMT Right eval Left eval  Hip flexion 4+ 4  Hip extension 4 * 4 *  Hip abduction 4+ 4-  Hip adduction    Hip internal rotation    Hip external rotation    Knee flexion 4+ 4+  Knee extension 4+ 4+  Ankle dorsiflexion 5 5  Ankle plantarflexion    Ankle inversion    Ankle eversion     (Blank rows = not tested) *= pain    FUNCTIONAL TESTS:  5 times sit to stand: 12.62 seconds without UE support 2 minute walk test: 325 feet  GAIT:  Distance walked: 325 feet Assistive device utilized: None Level of assistance: Complete Independence Comments: 2MWT, slow, increasing LBP at 1:40, decreased foot clearance bilaterally     TODAY'S TREATMENT  03/01/22: Supine: Deep breathing x 1  min Decompression paired with inhale and exhale upon exertion 10x 3" 02/23/22: Reviewed goals FOTO Bridge Ab set paired with breathing Marching with ab set  02/07/22 Bridge 2x 10    PATIENT EDUCATION:  Education details: Patient educated on exam findings, POC, scope of PT, HEP. Person educated: Patient Education method: Explanation, Demonstration, and Handouts Education comprehension: verbalized understanding, returned demonstration, verbal cues required, and tactile cues required   HOME EXERCISE PROGRAM: Access Code: [email or web address] URL: [email or web address] Date: 02/07/2022 - Supine Bridge  - 2 x daily - 7 x weekly - 2 sets - 10 reps 02/23/22 Supine march 10x5"  03/01/22: Decompression 2-5, RTB rows  ASSESSMENT:  CLINICAL IMPRESSION: Reviewed breathing and encouraged to exhale with exertion.  Added decompression and seated/standing postural strengthening exercises to HEP.  Pt able to complete all exercises with good form and minimal cueing for abdominal activaiton prior movement.  No reports of pain through session.       OBJECTIVE IMPAIRMENTS decreased activity tolerance, decreased endurance, decreased mobility, difficulty walking, decreased ROM, decreased strength, increased muscle spasms, impaired flexibility, improper body mechanics, postural dysfunction, and pain.   ACTIVITY LIMITATIONS carrying, lifting, bending, standing, squatting, stairs, transfers, locomotion level, and caring for others  PARTICIPATION LIMITATIONS: meal prep, cleaning, laundry, shopping, community activity, and yard work  PERSONAL FACTORS Time since onset of injury/illness/exacerbation and 3+ comorbidities: Chronic LBP, CKD, hx bipolar disorder, HTN, HLD, IBS, osteoporosis, hx CVA, R knee pain  are also affecting patient's functional outcome.   REHAB POTENTIAL: Good  CLINICAL DECISION MAKING: Stable/uncomplicated  EVALUATION COMPLEXITY: Low   GOALS: Goals reviewed with patient?  Yes  SHORT TERM GOALS: Target date: 02/28/2022  Patient will be independent with HEP in order to improve functional outcomes. Baseline:  Goal status: IN PROGRESS  2.  Patient will report at least 25% improvement in symptoms for improved quality of life. Baseline:  Goal status: IN PROGRESS   LONG TERM GOALS: Target date: 03/21/2022  Patient will report at least 75% improvement in symptoms for improved quality of life. Baseline:  Goal status: IN PROGRESS  2.  Patient will demonstrate grade of 5/5 MMT grade in all tested musculature as evidence of improved strength to assist with stair ambulation and gait.   Baseline: see MMT Goal status: IN PROGRESS  3.  Patient will demonstrate at least 25% improvement in lumbar ROM in all restricted planes for improved ability to move trunk while completing chores. Baseline: see AROM Goal status: IN PROGRESS  4.  Patient will be able to ambulate at least 425 feet in 2MWT in order to demonstrate improved tolerance to activity. Baseline: 02/07/22 325 feet Goal status: IN PROGRESS  5.  Patient will be able to complete 5x STS in under 11 seconds in order to reduce the risk of falls. Baseline: 02/07/22 12.62 seconds Goal status: IN PROGRESS     PLAN: PT FREQUENCY: 1x/week  PT DURATION: 6 weeks  PLANNED INTERVENTIONS: Therapeutic exercises, Therapeutic activity, Neuromuscular re-education, Balance training, Gait training, Patient/Family education, Joint manipulation, Joint mobilization, Stair training, Orthotic/Fit training, DME instructions, Aquatic Therapy, Dry Needling, Electrical stimulation, Spinal manipulation, Spinal mobilization, Cryotherapy, Moist heat, Compression bandaging, scar mobilization, Splintting, Taping, Traction, Ultrasound, Ionotophoresis 70m/ml Dexamethasone, and Manual therapy  PLAN FOR NEXT SESSION:  Progress core/glute strength, postural strengthening.  Add RTB decompression and shoulder extension with postural strengthening,  add controlled STS.  Ihor Austin, LPTA/CLT; CBIS [phone]  Aldona Lento, PTA 03/01/2022, 2:37 PM

## 2022-03-03 ENCOUNTER — Encounter: Payer: Self-pay | Admitting: Physical Therapy

## 2022-03-03 ENCOUNTER — Encounter: Payer: PPO | Admitting: Physical Therapy

## 2022-03-03 DIAGNOSIS — R102 Pelvic and perineal pain: Secondary | ICD-10-CM

## 2022-03-03 DIAGNOSIS — M6281 Muscle weakness (generalized): Secondary | ICD-10-CM | POA: Diagnosis not present

## 2022-03-03 DIAGNOSIS — I10 Essential (primary) hypertension: Secondary | ICD-10-CM

## 2022-03-03 DIAGNOSIS — E785 Hyperlipidemia, unspecified: Secondary | ICD-10-CM

## 2022-03-03 DIAGNOSIS — M549 Dorsalgia, unspecified: Secondary | ICD-10-CM | POA: Diagnosis not present

## 2022-03-03 DIAGNOSIS — Z978 Presence of other specified devices: Secondary | ICD-10-CM | POA: Diagnosis not present

## 2022-03-03 DIAGNOSIS — M62838 Other muscle spasm: Secondary | ICD-10-CM

## 2022-03-03 NOTE — Therapy (Signed)
OUTPATIENT PHYSICAL THERAPY TREATMENT NOTE   Patient Name: [name] MRN: [id number] DOB:Oct 31, 1960, 61 y.o., female Today's Date: 03/03/2022  PCP: Lindell Spar, MD REFERRING PROVIDER: Jaquita Folds, MD  END OF SESSION:   PT End of Session - 03/03/22 1600     Visit Number 3   pelvic floor   Date for PT Re-Evaluation 05/05/22    Authorization Type [employer] (no auth, MCR guidelines)    Authorization - Visit Number 4    Authorization - Number of Visits 10    PT Start Time 1600    PT Stop Time 8270    PT Time Calculation (min) 53 min    Activity Tolerance Patient tolerated treatment well    Behavior During Therapy WFL for tasks assessed/performed             Past Medical History:  Diagnosis Date   Allergy    grass, dust , mold   Anxiety    Arthritis    Asthma due to seasonal allergies 06/15/2020   Bipolar disorder ([place])    Carpal tunnel syndrome    Bilateral   Chest pain 09/2011   Cardiac cath-normal coronaries   Constipation    Depression    Difficulty urinating 05/31/2013   Elevated LFTs 12/16/2013   GERD (gastroesophageal reflux disease)    History of kidney stones    Hyperlipemia    Hyperlipidemia    Hypertension    Mild; provoked by stress and anxiety   IBS (irritable bowel syndrome)    Intracerebral bleed ([facility])    No aneurysm; followed by Dr. Carolan Shiver replaced    "lense transplant" 2022; pt states lense don't dilate or constrict   Loss of weight 01/06/2015   Osteoporosis    Stage 3a chronic kidney disease ([place]) 03/16/2021   Stroke ([place]) 1999   hemorrhagic stroke; weakness of left side   Past Surgical History:  Procedure Laterality Date   ABDOMINAL HYSTERECTOMY     "cancer cells"   BIOPSY  04/16/2021   Procedure: BIOPSY;  Surgeon: Montez Morita, Quillian Quince, MD;  Location: [facility];  Service: Gastroenterology;;  small, bowel, esophageal(proximal and distal);   BRAIN SURGERY  1999   to remove blood clot  after stroke    CARDIAC CATHETERIZATION  2016   CERVICAL FUSION     CHOLECYSTECTOMY N/A 10/14/2014   Procedure: LAPAROSCOPIC CHOLECYSTECTOMY WITH INTRAOPERATIVE CHOLANGIOGRAM;  Surgeon: Jackolyn Confer, MD;  Location: [facility];  Service: General;  Laterality: N/A;   CHONDROPLASTY Right 07/13/2017   Procedure: CHONDROPLASTY of patella;  Surgeon: Carole Civil, MD;  Location: [facility];  Service: Orthopedics;  Laterality: Right;   ESOPHAGEAL DILATION N/A 04/16/2021   Procedure: ESOPHAGEAL DILATION;  Surgeon: Harvel Quale, MD;  [facility];  Service: Gastroenterology;  Laterality: N/A;   ESOPHAGOGASTRODUODENOSCOPY (EGD) WITH PROPOFOL N/A 04/16/2021   Procedure: ESOPHAGOGASTRODUODENOSCOPY (EGD) WITH PROPOFOL;  Surgeon: Harvel Quale, MD;  [facility];  Service: Gastroenterology;  Laterality: N/A;  1:35, pt knows to arrive at 9:45   EUS N/A 08/21/2015   Procedure: ESOPHAGEAL ENDOSCOPIC ULTRASOUND (EUS) RADIAL;  Surgeon: Carol Ada, MD;  Location: [facility];  Service: Endoscopy;  Laterality: N/A;   KNEE ARTHROSCOPY WITH MEDIAL MENISECTOMY Right 07/13/2017   Procedure: KNEE ARTHROSCOPY WITH PARTIAL MEDIAL MENISECTOMY;  Surgeon: Carole Civil, MD;  Location: [facility];  Service: Orthopedics;  Laterality: Right;   LEFT HEART CATHETERIZATION WITH CORONARY ANGIOGRAM N/A 09/23/2011   Procedure: LEFT  HEART CATHETERIZATION WITH CORONARY ANGIOGRAM;  Surgeon: Thayer Headings, MD;  Location: [facility];  Service: Cardiovascular;  Laterality: N/A;   LIPOMA EXCISION Left 11/18/2013   Procedure: EXCISION OF SOFT TISSUE MASS-LEFT THIGH;  Surgeon: Jamesetta So, MD;  Location: [facility];  Service: General;  Laterality: Left;   NASAL SEPTOPLASTY W/ TURBINOPLASTY Bilateral 08/30/2021   Procedure: NASAL SEPTOPLASTY WITH BILATERAL TURBINATE REDUCTION;  Surgeon: Leta Baptist, MD;  Location: [facility];  Service: ENT;  Laterality: Bilateral;   POLYPECTOMY   04/16/2021   Procedure: POLYPECTOMY;  Surgeon: Harvel Quale, MD;  [facility];  Service: Gastroenterology;;  gastric   RECTOCELE REPAIR     x2   RECTOCELE REPAIR N/A 04/04/2017   Procedure: POSTERIOR REPAIR (RECTOCELE);  Surgeon: Jonnie Kind, MD;  Location: [facility];  Service: Gynecology;  Laterality: N/A;   Patient Active Problem List   Diagnosis Date Noted   Dystrophia unguium 02/01/2022   Lumbar compression fracture ([place]) 12/23/2021   Lumbar spondylosis 11/04/2021   Encounter for general adult medical examination with abnormal findings 07/07/2021   Gastric polyp 05/31/2021   NASH (nonalcoholic steatohepatitis) 04/01/2021   Dysphagia 04/01/2021   Stage 3a chronic kidney disease ([place]) 03/16/2021   Breast pain 03/02/2021   Arthritis 01/31/2021   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   History of hemorrhagic stroke with residual hemiplegia ([place]) 06/15/2020   Urinary incontinence 06/15/2020   Asthma due to seasonal allergies 06/15/2020   Vaginal itching 03/16/2020   S/P right knee arthroscopy 07/13/17 07/20/2017   Derangement of posterior horn of medial meniscus of right knee    Chondromalacia, patella, right    IBS (irritable bowel syndrome) 05/11/2015   Gait instability 01/06/2015   DDD (degenerative disc disease), lumbar 02/18/2014   Postmenopausal atrophic vaginitis 11/01/2013   Lipoma of abdominal wall 08/14/2013   Back pain 05/29/2013   Paresthesia of foot 02/12/2013   Hypertension    GERD (gastroesophageal reflux disease)    Depression 11/17/2011   Insomnia 11/17/2011   Mixed hyperlipidemia 09/20/2011   Anxiety 09/20/2011   REFERRING DIAG:  N94.10 (ICD-10-CM) - Dyspareunia, female  M62.838 (ICD-10-CM) - Levator spasm      THERAPY DIAG:  Muscle weakness (generalized)   Other muscle spasm   Pelvic pain   Rationale for Evaluation and Treatment Rehabilitation   ONSET DATE: 2013   SUBJECTIVE:                                                                                                                                                                                             SUBJECTIVE STATEMENT: I was  comfortable with you.     PAIN:  Are you having pain? Yes NPRS scale: 9/10 dilator for initial penetration and no pain after it goes further Pain location: Vaginal   Pain type: sharp and tight Pain description: intermittent    Aggravating factors: using the dilators Relieving factors: no dilator or vaginal penetration   PRECAUTIONS: Other: osteoporosis   WEIGHT BEARING RESTRICTIONS No   FALLS:  Has patient fallen in last 6 months? Yes. Number of falls 1 time and she is attending therapy for back and the balance   LIVING ENVIRONMENT: Lives with: lives with their spouse   OCCUPATION: disable   PLOF: Independent   PATIENT GOALS  be able to have penile penetration   PERTINENT HISTORY:  Hysterectomy; osteoporosis; IBS; Stage 3a chronic kidney disease; stroke; rectocele repair x 3 Sexual abuse: No   BOWEL MOVEMENT Pain with bowel movement: Yes Type of bowel movement:Type (Bristol Stool Scale) Type 7 or Type 4 wit fiber, Frequency 1 time per day unless she has diarrhea then multiple times, Strain Yes, and Splinting no Fully empty rectum: Yes: not all of the time.  Leakage: No Pads: No Fiber supplement: Yes: taking fiber   URINATION Pain with urination: No Fully empty bladder: Yes: not sure  Stream: Strong Urgency: Yes: sometimes feels she has to go to the bathroom but no urine comes out.  Frequency: 2-3 times per day, 2 times per night Leakage: Urge to void and Walking to the bathroom Pads: Yes: changes every time she goes to the bathroom   INTERCOURSE Pain with intercourse: Initial Penetration Ability to have vaginal penetration:  No since the rectocele surgery Marinoff Scale: 3/3 T rouble with orgasm     OBJECTIVE:    DIAGNOSTIC FINDINGS:  PVR of 101 ml was obtained by bladder scan.     PATIENT  SURVEYS:  none     COGNITION:            Overall cognitive status: Within functional limits for tasks assessed ; She has short term memory loss from stroke.                   SENSATION:            Light touch: Appears intact            Proprioception: Appears intact                      POSTURE: No Significant postural limitations               PELVIC ALIGNMENT:     LOWER EXTREMITY [id number] hip ROM is full       LOWER EXTREMITY MMT:   MMT Right eval Left eval  Hip flexion 4/5 4/5  Hip extension 4/5 4/5  Hip abduction 4/5 4/5  Hip adduction 4/5 4/5     PALPATION:   General  tightness in bilateral hip adductors                 External Perineal Exam decreased mobility of the urogenital daphragm                             Internal Pelvic Floor tenderness in the posterior vaginal canal, along the introitus, levator ani and obturator internist   Patient confirms identification and approves PT to assess internal pelvic floor and treatment Yes   PELVIC MMT:   MMT eval  Vaginal  2/5  (Blank rows = not tested)         TONE: increased   PROLAPSE: none   TODAY'S TREATMENT  03/03/2022 Manual: Soft tissue mobilization:circular massage to the abdomen and educated patient and her husband on the massage.  Myofascial release:tissue rolling of the abdomen, release of the mesenteric root, release suprapubically,  released around the umbilicus Internal pelvic floor techniques: No emotional/communication barriers or cognitive limitation. Patient is motivated to learn. Patient understands and agrees with treatment goals and plan. PT explains patient will be examined in standing, sitting, and lying down to see how their muscles and joints work. When they are ready, they will be asked to remove their underwear so PT can examine their perineum. The patient is also given the option of providing their own chaperone as one is not provided in our facility. The patient also has the  right and is explained the right to defer or refuse any part of the evaluation or treatment including the internal exam. With the patient's consent, PT will use one gloved finger to gently assess the muscles of the pelvic floor, seeing how well it contracts and relaxes and if there is muscle symmetry. After, the patient will get dressed and PT and patient will discuss exam findings and plan of care. PT and patient discuss plan of care, schedule, attendance policy and HEP activities.  Going through the vaginal canal working on the perineal body and the sides of the introitus to lengthen the tissue Then place the blue dilator into the vaginal canal going slowly monitoring for pain No pain so went with the green dilator and had 2/10 pain with the initial penetration and was able to place in 3/4  Self-care: Reviewed on how to progress to the next size dilator    02/17/2022 Manual: Soft tissue mobilization:massage the vulvar area and urogenitial diaphragm then showed husband what to do and he returned demonstration.  Internal pelvic floor techniques:No emotional/communication barriers or cognitive limitation. Patient is motivated to learn. Patient understands and agrees with treatment goals and plan. PT explains patient will be examined in standing, sitting, and lying down to see how their muscles and joints work. When they are ready, they will be asked to remove their underwear so PT can examine their perineum. The patient is also given the option of providing their own chaperone as one is not provided in our facility. The patient also has the right and is explained the right to defer or refuse any part of the evaluation or treatment including the internal exam. With the patient's consent, PT will use one gloved finger to gently assess the muscles of the pelvic floor, seeing how well it contracts and relaxes and if there is muscle symmetry. After, the patient will get dressed and PT and patient will discuss  exam findings and plan of care. PT and patient discuss plan of care, schedule, attendance policy and HEP activities.  Therapist placed her finger into the vaginal canal to work on the post. Vaginal canal and show her husband on how to work the pelvic floor muscles with correct pressure Therapist had husband place blue dilator into the vaginal canal as she explained how to insert the dilator and move it around to stretch the tissue Self-care: Educated and husband on using water based lubricant with dilators.     EVAL Date: 02/10/2022 HEP established-see below     PATIENT EDUCATION: 02/17/2022 Education details: how to use the dilator, how to massage the perineal area, introitus  and post vaginal canal Person educated: Patient and Spouse Education method: Explanation, Demonstration, Tactile cues, Verbal cues, and Handouts Education comprehension: verbalized understanding, returned demonstration, verbal cues required, tactile cues required, and needs further education     HOME EXERCISE PROGRAM: See above.    ASSESSMENT:   CLINICAL IMPRESSION: Patient would benefit from skilled therapy to improve pelvic floor pain and reduce leakage. Patient and husband understand what to do for abdominal massage. Her abdomen was softer. Filomena Jungling was abl eto have the next size dilator into the vaginal canal with no more than 2/10 pain and then pain went to 0/10. She was able to have the next size dilator 3/4 of the way into the vaginal canal. Patient was able to urinate easily after therapy. Patient will benefit from skilled therapy to work on tissue expansion of the vagina for penetration and increase strength to reduce leakage.      OBJECTIVE IMPAIRMENTS decreased activity tolerance, decreased endurance, decreased strength, increased fascial restrictions, impaired tone, and pain.    ACTIVITY LIMITATIONS continence and toileting   PARTICIPATION LIMITATIONS: interpersonal relationship and community activity    PERSONAL FACTORS Hysterectomy; osteoporosis; IBS; Stage 3a chronic kidney disease; stroke; rectocele repair x 3 are also affecting patient's functional outcome.    REHAB POTENTIAL: Excellent   CLINICAL DECISION MAKING: Stable/uncomplicated   EVALUATION COMPLEXITY: Moderate     GOALS: Goals reviewed with patient? Yes   SHORT TERM GOALS: Target date: 03/10/2022   Patient and husband were instructed on how to perform manual work to the pelvic floor to improve tissue mobility.  Baseline: Goal status: Met 02/17/2022   2.  Patient and her husband were educated on how to use the dilator vaginally so she is not having pain >/= 3/10.  Baseline:  Goal status: Met 02/17/2022   3.  Patient is able to perform diaphragmatic breathing to relax the pelvic floor and able to bulge the pelvic floor.  Baseline:  Goal status: INITIAL     LONG TERM GOALS: Target date: 05/05/2022    Patient is independent with advanced HEP for the pelvic floor to reduce leakage.  Baseline:  Goal status: INITIAL   2.  Patient is able to use the largest dilator due to improved mobility of the vaginal tissue and her ability to relax the pelvic floor.  Baseline:  Goal status: INITIAL   3.  Patient is able to have the urge to urinate and urine is able to come out of the urethra due to improved relaxation of the pelvic floor.  Baseline:  Goal status: INITIAL   4.  Patient is able to have penile penetration with pain level </= 1-2/10 due to improved tissue lengthening.  Baseline:  Goal status: INITIAL   5.  Pelvic floor strength is 3/5 so she is able to reduce her urinary leakage >/= 80%.  Baseline:  Goal status: INITIAL       PLAN: PT FREQUENCY: 1x/week   PT DURATION: 12 weeks   PLANNED INTERVENTIONS: Therapeutic exercises, Therapeutic activity, Neuromuscular re-education, Balance training, Self Care, Dry Needling, Electrical stimulation, Cryotherapy, Moist heat, Taping, Biofeedback, and Manual therapy    PLAN FOR NEXT SESSION: review the use of the dilator and see if needs the next size,  therapist performing manual work to the pelvic floor    Earlie Counts, PT 03/03/22 4:53 PM

## 2022-03-04 ENCOUNTER — Encounter: Payer: Self-pay | Admitting: Internal Medicine

## 2022-03-08 ENCOUNTER — Encounter (HOSPITAL_COMMUNITY): Payer: PPO | Admitting: Physical Therapy

## 2022-03-08 NOTE — Telephone Encounter (Signed)
fyi

## 2022-03-14 NOTE — Telephone Encounter (Signed)
Patient wants to proceed with swallowing test. Please have Ann set up please. Thanks

## 2022-03-15 ENCOUNTER — Encounter (HOSPITAL_COMMUNITY): Payer: PPO | Admitting: Physical Therapy

## 2022-03-16 ENCOUNTER — Other Ambulatory Visit: Payer: Self-pay | Admitting: *Deleted

## 2022-03-16 MED ORDER — OMEPRAZOLE 40 MG PO CPDR: 40.0000 mg | DELAYED_RELEASE_CAPSULE | Freq: Every day | ORAL | 5 refills | Status: DC

## 2022-03-21 ENCOUNTER — Other Ambulatory Visit: Payer: Self-pay | Admitting: Internal Medicine

## 2022-03-21 DIAGNOSIS — K6389 Other specified diseases of intestine: Secondary | ICD-10-CM | POA: Diagnosis not present

## 2022-03-21 DIAGNOSIS — R140 Abdominal distension (gaseous): Secondary | ICD-10-CM | POA: Diagnosis not present

## 2022-03-21 DIAGNOSIS — A048 Other specified bacterial intestinal infections: Secondary | ICD-10-CM | POA: Diagnosis not present

## 2022-03-21 DIAGNOSIS — E785 Hyperlipidemia, unspecified: Secondary | ICD-10-CM

## 2022-03-22 ENCOUNTER — Ambulatory Visit (HOSPITAL_COMMUNITY): Payer: PPO | Admitting: Physical Therapy

## 2022-03-23 ENCOUNTER — Ambulatory Visit: Payer: PPO | Attending: Obstetrics and Gynecology | Admitting: Physical Therapy

## 2022-03-23 ENCOUNTER — Encounter: Payer: Self-pay | Admitting: Physical Therapy

## 2022-03-23 DIAGNOSIS — R102 Pelvic and perineal pain: Secondary | ICD-10-CM | POA: Insufficient documentation

## 2022-03-23 DIAGNOSIS — M6281 Muscle weakness (generalized): Secondary | ICD-10-CM | POA: Insufficient documentation

## 2022-03-23 DIAGNOSIS — M62838 Other muscle spasm: Secondary | ICD-10-CM | POA: Diagnosis not present

## 2022-03-23 NOTE — Therapy (Addendum)
OUTPATIENT PHYSICAL THERAPY TREATMENT NOTE   Patient Name: [name] MRN: [id number] DOB:March 23, 1961, 61 y.o., female Today's Date: 03/23/2022  PCP: Lindell Spar, MD REFERRING PROVIDER: Jaquita Folds, MD    END OF SESSION:   PT End of Session - 03/23/22 1453     Visit Number 4    Date for PT Re-Evaluation 05/05/22    Authorization Type [employer] (no auth, MCR guidelines)    Authorization - Visit Number 5    Authorization - Number of Visits 10    PT Start Time 1450    PT Stop Time 1525    PT Time Calculation (min) 35 min    Activity Tolerance Patient tolerated treatment well    Behavior During Therapy WFL for tasks assessed/performed             Past Medical History:  Diagnosis Date   Allergy    grass, dust , mold   Anxiety    Arthritis    Asthma due to seasonal allergies 06/15/2020   Bipolar disorder ([place])    Carpal tunnel syndrome    Bilateral   Chest pain 09/2011   Cardiac cath-normal coronaries   Constipation    Depression    Difficulty urinating 05/31/2013   Elevated LFTs 12/16/2013   GERD (gastroesophageal reflux disease)    History of kidney stones    Hyperlipemia    Hyperlipidemia    Hypertension    Mild; provoked by stress and anxiety   IBS (irritable bowel syndrome)    Intracerebral bleed ([facility])    No aneurysm; followed by Dr. Carolan Shiver replaced    "lense transplant" 2022; pt states lense don't dilate or constrict   Loss of weight 01/06/2015   Osteoporosis    Stage 3a chronic kidney disease ([place]) 03/16/2021   Stroke ([place]) 1999   hemorrhagic stroke; weakness of left side   Past Surgical History:  Procedure Laterality Date   ABDOMINAL HYSTERECTOMY     "cancer cells"   BIOPSY  04/16/2021   Procedure: BIOPSY;  Surgeon: Montez Morita, Quillian Quince, MD;  [facility]: [facility];  Service: Gastroenterology;;  small, bowel, esophageal(proximal and distal);   BRAIN SURGERY  1999   to remove blood clot after stroke     CARDIAC CATHETERIZATION  2016   CERVICAL FUSION     CHOLECYSTECTOMY N/A 10/14/2014   Procedure: LAPAROSCOPIC CHOLECYSTECTOMY WITH INTRAOPERATIVE CHOLANGIOGRAM;  Surgeon: Jackolyn Confer, MD;  Location: [facility];  Service: General;  Laterality: N/A;   CHONDROPLASTY Right 07/13/2017   Procedure: CHONDROPLASTY of patella;  Surgeon: Carole Civil, MD;  Location: [facility];  Service: Orthopedics;  Laterality: Right;   ESOPHAGEAL DILATION N/A 04/16/2021   Procedure: ESOPHAGEAL DILATION;  Surgeon: Harvel Quale, MD;  [facility];  Service: Gastroenterology;  Laterality: N/A;   ESOPHAGOGASTRODUODENOSCOPY (EGD) WITH PROPOFOL N/A 04/16/2021   Procedure: ESOPHAGOGASTRODUODENOSCOPY (EGD) WITH PROPOFOL;  Surgeon: Harvel Quale, MD;  [facility];  Service: Gastroenterology;  Laterality: N/A;  1:35, pt knows to arrive at 9:45   EUS N/A 08/21/2015   Procedure: ESOPHAGEAL ENDOSCOPIC ULTRASOUND (EUS) RADIAL;  Surgeon: Carol Ada, MD;  Location: [facility];  Service: Endoscopy;  Laterality: N/A;   KNEE ARTHROSCOPY WITH MEDIAL MENISECTOMY Right 07/13/2017   Procedure: KNEE ARTHROSCOPY WITH PARTIAL MEDIAL MENISECTOMY;  Surgeon: Carole Civil, MD;  Location: [facility];  Service: Orthopedics;  Laterality: Right;   LEFT HEART CATHETERIZATION WITH CORONARY ANGIOGRAM N/A 09/23/2011   Procedure: LEFT HEART  CATHETERIZATION WITH CORONARY ANGIOGRAM;  Surgeon: Thayer Headings, MD;  Location: [facility];  Service: Cardiovascular;  Laterality: N/A;   LIPOMA EXCISION Left 11/18/2013   Procedure: EXCISION OF SOFT TISSUE MASS-LEFT THIGH;  Surgeon: Jamesetta So, MD;  Location: [facility];  Service: General;  Laterality: Left;   NASAL SEPTOPLASTY W/ TURBINOPLASTY Bilateral 08/30/2021   Procedure: NASAL SEPTOPLASTY WITH BILATERAL TURBINATE REDUCTION;  Surgeon: Leta Baptist, MD;  Location: [facility];  Service: ENT;  Laterality: Bilateral;   POLYPECTOMY  04/16/2021    Procedure: POLYPECTOMY;  Surgeon: Harvel Quale, MD;  [facility];  Service: Gastroenterology;;  gastric   RECTOCELE REPAIR     x2   RECTOCELE REPAIR N/A 04/04/2017   Procedure: POSTERIOR REPAIR (RECTOCELE);  Surgeon: Jonnie Kind, MD;  Location: [facility];  Service: Gynecology;  Laterality: N/A;   Patient Active Problem List   Diagnosis Date Noted   Dystrophia unguium 02/01/2022   Lumbar compression fracture ([place]) 12/23/2021   Lumbar spondylosis 11/04/2021   Encounter for general adult medical examination with abnormal findings 07/07/2021   Gastric polyp 05/31/2021   NASH (nonalcoholic steatohepatitis) 04/01/2021   Dysphagia 04/01/2021   Stage 3a chronic kidney disease ([place]) 03/16/2021   Breast pain 03/02/2021   Arthritis 01/31/2021   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   History of hemorrhagic stroke with residual hemiplegia ([place]) 06/15/2020   Urinary incontinence 06/15/2020   Asthma due to seasonal allergies 06/15/2020   Vaginal itching 03/16/2020   S/P right knee arthroscopy 07/13/17 07/20/2017   Derangement of posterior horn of medial meniscus of right knee    Chondromalacia, patella, right    IBS (irritable bowel syndrome) 05/11/2015   Gait instability 01/06/2015   DDD (degenerative disc disease), lumbar 02/18/2014   Postmenopausal atrophic vaginitis 11/01/2013   Lipoma of abdominal wall 08/14/2013   Back pain 05/29/2013   Paresthesia of foot 02/12/2013   Hypertension    GERD (gastroesophageal reflux disease)    Depression 11/17/2011   Insomnia 11/17/2011   Mixed hyperlipidemia 09/20/2011   Anxiety 09/20/2011   REFERRING DIAG:  N94.10 (ICD-10-CM) - Dyspareunia, female  M62.838 (ICD-10-CM) - Levator spasm      THERAPY DIAG:  Muscle weakness (generalized)   Other muscle spasm   Pelvic pain   Rationale for Evaluation and Treatment Rehabilitation   ONSET DATE: 2013   SUBJECTIVE:                                                                                                                                                                                             SUBJECTIVE STATEMENT: I have had  constant diarrhea so not able to do the dilator as often. I am on the same dilator due to not feeling well to use them. The stool is getting firmer. Taking more fiber and is helping the stool.    PAIN:  Are you having pain? Yes NPRS scale: 9/10 dilator for initial penetration and no pain after it goes further Pain location: Vaginal   Pain type: sharp and tight Pain description: intermittent    Aggravating factors: using the dilators Relieving factors: no dilator or vaginal penetration   PRECAUTIONS: Other: osteoporosis   WEIGHT BEARING RESTRICTIONS No   FALLS:  Has patient fallen in last 6 months? Yes. Number of falls 1 time and she is attending therapy for back and the balance   LIVING ENVIRONMENT: Lives with: lives with their spouse   OCCUPATION: disable   PLOF: Independent   PATIENT GOALS  be able to have penile penetration   PERTINENT HISTORY:  Hysterectomy; osteoporosis; IBS; Stage 3a chronic kidney disease; stroke; rectocele repair x 3 Sexual abuse: No   BOWEL MOVEMENT 03/23/2022 Pain with bowel movement: No Type of bowel movement:Type (Bristol Stool Scale) Type 7 or Type 4 wit fiber, Frequency 1 time per day unless she has diarrhea then multiple times, Strain Yes, and Splinting no Fully empty rectum: Yes Leakage: No Pads: No Fiber supplement: Yes: taking fiber   URINATION 03/23/2022 Pain with urination: No Fully empty bladder: Yes: not sure  Stream: Strong Urgency: Yes: sometimes feels she has to go to the bathroom but no urine comes out.  Frequency: 2-3 times per day, 2 times per night Leakage: Urge to void and Walking to the bathroom (happening less) Pads: Yes: changes every time she goes to the bathroom   INTERCOURSE Pain with intercourse: Initial Penetration Ability to have vaginal penetration:  No  since the rectocele surgery Marinoff Scale: 3/3 T rouble with orgasm     OBJECTIVE:    DIAGNOSTIC FINDINGS:  PVR of 101 ml was obtained by bladder scan.     PATIENT SURVEYS:  none     COGNITION:            Overall cognitive status: Within functional limits for tasks assessed ; She has short term memory loss from stroke.                   SENSATION:            Light touch: Appears intact            Proprioception: Appears intact                      POSTURE: No Significant postural limitations               PELVIC ALIGNMENT:     LOWER EXTREMITY [id number] hip ROM is full       LOWER EXTREMITY MMT:   MMT Right eval Left eval  Hip flexion 4/5 4/5  Hip extension 4/5 4/5  Hip abduction 4/5 4/5  Hip adduction 4/5 4/5     PALPATION:   General  tightness in bilateral hip adductors                 External Perineal Exam decreased mobility of the urogenital daphragm                             Internal Pelvic Floor tenderness in the posterior vaginal canal, along the  introitus, levator ani and obturator internist   Patient confirms identification and approves PT to assess internal pelvic floor and treatment Yes   PELVIC MMT:   MMT eval 03/23/2022  Vaginal 2/5 3/5 with weak hug of the therapist finger.   (Blank rows = not tested)         TONE: increased   PROLAPSE: none   TODAY'S TREATMENT  03/23/2022 Manual: Internal pelvic floor techniques:No emotional/communication barriers or cognitive limitation. Patient is motivated to learn. Patient understands and agrees with treatment goals and plan. PT explains patient will be examined in standing, sitting, and lying down to see how their muscles and joints work. When they are ready, they will be asked to remove their underwear so PT can examine their perineum. The patient is also given the option of providing their own chaperone as one is not provided in our facility. The patient also has the right and is  explained the right to defer or refuse any part of the evaluation or treatment including the internal exam. With the patient's consent, PT will use one gloved finger to gently assess the muscles of the pelvic floor, seeing how well it contracts and relaxes and if there is muscle symmetry. After, the patient will get dressed and PT and patient will discuss exam findings and plan of care. PT and patient discuss plan of care, schedule, attendance policy and HEP activities.    Going through the vagina working on the posterior wall, along the perineal body, along the superior transverse perineum to elongate the tissue and prepare for the dilator Neuromuscular re-education: Down training:contract then relax placing the 2/4 dilator while monitoring for pain and going slowly placing it all the way in. Then went to the 3/4 dilator and placed in 2 inches with some pain at that point and some resistance.     03/03/2022 Manual: Soft tissue mobilization:circular massage to the abdomen and educated patient and her husband on the massage.  Myofascial release:tissue rolling of the abdomen, release of the mesenteric root, release suprapubically,  released around the umbilicus Internal pelvic floor techniques: No emotional/communication barriers or cognitive limitation. Patient is motivated to learn. Patient understands and agrees with treatment goals and plan. PT explains patient will be examined in standing, sitting, and lying down to see how their muscles and joints work. When they are ready, they will be asked to remove their underwear so PT can examine their perineum. The patient is also given the option of providing their own chaperone as one is not provided in our facility. The patient also has the right and is explained the right to defer or refuse any part of the evaluation or treatment including the internal exam. With the patient's consent, PT will use one gloved finger to gently assess the muscles of the pelvic  floor, seeing how well it contracts and relaxes and if there is muscle symmetry. After, the patient will get dressed and PT and patient will discuss exam findings and plan of care. PT and patient discuss plan of care, schedule, attendance policy and HEP activities.  Going through the vaginal canal working on the perineal body and the sides of the introitus to lengthen the tissue Then place the blue dilator into the vaginal canal going slowly monitoring for pain No pain so went with the green dilator and had 2/10 pain with the initial penetration and was able to place in 3/4   Self-care: Reviewed on how to progress to the next size dilator  02/17/2022 Manual: Soft tissue mobilization:massage the vulvar area and urogenitial diaphragm then showed husband what to do and he returned demonstration.  Internal pelvic floor techniques:No emotional/communication barriers or cognitive limitation. Patient is motivated to learn. Patient understands and agrees with treatment goals and plan. PT explains patient will be examined in standing, sitting, and lying down to see how their muscles and joints work. When they are ready, they will be asked to remove their underwear so PT can examine their perineum. The patient is also given the option of providing their own chaperone as one is not provided in our facility. The patient also has the right and is explained the right to defer or refuse any part of the evaluation or treatment including the internal exam. With the patient's consent, PT will use one gloved finger to gently assess the muscles of the pelvic floor, seeing how well it contracts and relaxes and if there is muscle symmetry. After, the patient will get dressed and PT and patient will discuss exam findings and plan of care. PT and patient discuss plan of care, schedule, attendance policy and HEP activities.  Therapist placed her finger into the vaginal canal to work on the post. Vaginal canal and show her  husband on how to work the pelvic floor muscles with correct pressure Therapist had husband place blue dilator into the vaginal canal as she explained how to insert the dilator and move it around to stretch the tissue Self-care: Educated and husband on using water based lubricant with dilators.        PATIENT EDUCATION: 02/17/2022 Education details: how to use the dilator, how to massage the perineal area, introitus and post vaginal canal Person educated: Patient and Spouse Education method: Explanation, Demonstration, Tactile cues, Verbal cues, and Handouts Education comprehension: verbalized understanding, returned demonstration, verbal cues required, tactile cues required, and needs further education     HOME EXERCISE PROGRAM: See above.    ASSESSMENT:   CLINICAL IMPRESSION: Patient would benefit from skilled therapy to improve pelvic floor pain and reduce leakage. Patient has the urge to urinate and no urine comes out. Patient has not used the dilator since last session due to digestive issues and diarrhea going on. I had to have a lot of tests to determine what is going on and will have more. Patient was able to tolerate the 3/4 dilator without discomfort. She was able to tolerate 2 inches of the 3/4 dilator with minimal pain. She will be using the 2/4 then 3/4 dilator at home. Pelvic floor strength is 3/5 but does not fully hug the therapist finger. Patient will benefit from skilled therapy to work on tissue expansion of the vagina for penetration and increase strength to reduce leakage.      OBJECTIVE IMPAIRMENTS decreased activity tolerance, decreased endurance, decreased strength, increased fascial restrictions, impaired tone, and pain.    ACTIVITY LIMITATIONS continence and toileting   PARTICIPATION LIMITATIONS: interpersonal relationship and community activity   PERSONAL FACTORS Hysterectomy; osteoporosis; IBS; Stage 3a chronic kidney disease; stroke; rectocele repair x 3 are  also affecting patient's functional outcome.    REHAB POTENTIAL: Excellent   CLINICAL DECISION MAKING: Stable/uncomplicated   EVALUATION COMPLEXITY: Moderate     GOALS: Goals reviewed with patient? Yes   SHORT TERM GOALS: Target date: 03/10/2022   Patient and husband were instructed on how to perform manual work to the pelvic floor to improve tissue mobility.  Baseline: Goal status: Met 02/17/2022   2.  Patient and her husband were  educated on how to use the dilator vaginally so she is not having pain >/= 3/10.  Baseline:  Goal status: Met 02/17/2022   3.  Patient is able to perform diaphragmatic breathing to relax the pelvic floor and able to bulge the pelvic floor.  Baseline:  Goal status: Met 03/23/2022     LONG TERM GOALS: Target date: 05/05/2022    Patient is independent with advanced HEP for the pelvic floor to reduce leakage.  Baseline:  Goal status: INITIAL   2.  Patient is able to use the largest dilator due to improved mobility of the vaginal tissue and her ability to relax the pelvic floor.  Baseline:  Goal status: INITIAL   3.  Patient is able to have the urge to urinate and urine is able to come out of the urethra due to improved relaxation of the pelvic floor.  Baseline:  Goal status: INITIAL   4.  Patient is able to have penile penetration with pain level </= 1-2/10 due to improved tissue lengthening.  Baseline:  Goal status: INITIAL   5.  Pelvic floor strength is 3/5 so she is able to reduce her urinary leakage >/= 80%.  Baseline:  Goal status: INITIAL       PLAN: PT FREQUENCY: 1x/week   PT DURATION: 12 weeks   PLANNED INTERVENTIONS: Therapeutic exercises, Therapeutic activity, Neuromuscular re-education, Balance training, Self Care, Dry Needling, Electrical stimulation, Cryotherapy, Moist heat, Taping, Biofeedback, and Manual therapy   PLAN FOR NEXT SESSION: review the use of the dilator and see if needs the next size,  therapist performing manual  work to the pelvic floor on the posterior aspect, work on pelvic drop.    Earlie Counts, PT 03/23/22 3:30 PM  PHYSICAL THERAPY DISCHARGE SUMMARY  Visits from Start of Care: 4  Current functional level related to goals / functional outcomes: See above.   Remaining deficits: Patient is presently healing form a compression fracture at L2. She is to wear a brace at all times except for when she is sleeping. Therapist and patient decided she is to be discharged at this time to focus on her back healing.    Education / Equipment: HEP   Patient agrees to discharge. Patient goals were not met. Patient is being discharged due to a change in medical status. Thank you for the referral. Earlie Counts, PT 05/03/22 1:35 PM

## 2022-03-24 ENCOUNTER — Encounter (HOSPITAL_COMMUNITY): Payer: PPO | Admitting: Physical Therapy

## 2022-03-28 ENCOUNTER — Encounter: Payer: Self-pay | Admitting: *Deleted

## 2022-03-28 DIAGNOSIS — J310 Chronic rhinitis: Secondary | ICD-10-CM | POA: Diagnosis not present

## 2022-03-28 DIAGNOSIS — Z90710 Acquired absence of both cervix and uterus: Secondary | ICD-10-CM | POA: Insufficient documentation

## 2022-03-28 DIAGNOSIS — J343 Hypertrophy of nasal turbinates: Secondary | ICD-10-CM | POA: Diagnosis not present

## 2022-03-29 ENCOUNTER — Other Ambulatory Visit (HOSPITAL_COMMUNITY): Payer: Self-pay | Admitting: Psychiatry

## 2022-03-29 ENCOUNTER — Other Ambulatory Visit: Payer: Self-pay | Admitting: Family

## 2022-03-29 ENCOUNTER — Ambulatory Visit (INDEPENDENT_AMBULATORY_CARE_PROVIDER_SITE_OTHER): Payer: PPO

## 2022-03-29 ENCOUNTER — Encounter (INDEPENDENT_AMBULATORY_CARE_PROVIDER_SITE_OTHER): Payer: Self-pay | Admitting: Gastroenterology

## 2022-03-29 ENCOUNTER — Telehealth (HOSPITAL_COMMUNITY): Payer: Self-pay | Admitting: *Deleted

## 2022-03-29 ENCOUNTER — Telehealth (INDEPENDENT_AMBULATORY_CARE_PROVIDER_SITE_OTHER): Payer: Self-pay

## 2022-03-29 DIAGNOSIS — Z0000 Encounter for general adult medical examination without abnormal findings: Secondary | ICD-10-CM | POA: Diagnosis not present

## 2022-03-29 MED ORDER — METHYLPHENIDATE HCL 20 MG PO TABS: 20.0000 mg | ORAL_TABLET | Freq: Two times a day (BID) | ORAL | 0 refills | Status: DC

## 2022-03-29 NOTE — Progress Notes (Signed)
Subjective:   Daizee L Villacres is a 61 y.o. female who presents for Medicare Annual (Subsequent) preventive examination.  Review of Systems    I connected with  [place] on 03/29/22 by a audio enabled telemedicine application and verified that I am speaking with the correct person using two identifiers.  Patient Location: Home  Provider Location: Office/Clinic  I discussed the limitations of evaluation and management by telemedicine. The patient expressed understanding and agreed to proceed.        Objective:    There were no vitals filed for this visit. There is no height or weight on file to calculate BMI.     02/10/2022   10:34 AM 02/07/2022   10:00 AM 11/11/2021    4:15 PM 08/30/2021    6:44 AM 04/14/2021    2:26 PM 01/06/2021    4:03 PM 03/18/2018   12:09 PM  Advanced Directives  Does Patient Have a Medical Advance Directive? No No No No No No No  Would patient like information on creating a medical advance directive? No - Patient declined No - Patient declined No - Patient declined No - Patient declined No - Patient declined No - Patient declined     Current Medications (verified) Outpatient Encounter Medications as of 03/29/2022  Medication Sig   acetaminophen (TYLENOL) 500 MG tablet Take 500-1,000 mg by mouth every 6 (six) hours as needed for moderate pain.   ALPRAZolam (XANAX) 1 MG tablet Take 1 tablet (1 mg total) by mouth 3 (three) times daily.   atorvastatin (LIPITOR) 40 MG tablet TAKE ONE TABLET BY MOUTH ONCE DAILY.   Calcium 500-125 MG-UNIT TABS Take 1 tablet by mouth daily with supper.   Carboxymethylcellulose Sod PF 0.5 % SOLN Place 1 drop into both eyes daily as needed (dry eyes).   cyclobenzaprine (FLEXERIL) 5 MG tablet Take 1 tablet (5 mg total) by mouth 3 (three) times daily as needed. for muscle spams   diclofenac (CATAFLAM) 50 MG tablet TAKE (1) TABLET BY MOUTH TWICE DAILY.   diclofenac Sodium (VOLTAREN) 1 % GEL Apply 1 application topically daily  as needed (pain).   dicyclomine (BENTYL) 10 MG capsule TAKE ONE CAPSULE EVERY 12 HOURS AS NEEDED   estradiol (ESTRACE) 0.1 MG/GM vaginal cream Place 0.5 g vaginally 2 (two) times a week. Place 0.5g nightly for two weeks then twice a week after   ezetimibe (ZETIA) 10 MG tablet Take 1 tablet (10 mg total) by mouth daily.   famotidine (PEPCID) 20 MG tablet Take 20 mg by mouth daily as needed.   lidocaine-prilocaine (EMLA) cream Apply 1 application. topically as needed. Place on vagina. Wipe off excess after a few minutes   lisinopril (ZESTRIL) 20 MG tablet TAKE 1 TABLET BY MOUTH DAILY.   loratadine (CLARITIN) 10 MG tablet Take 10 mg by mouth daily.   methylphenidate (RITALIN) 20 MG tablet Take 1 tablet (20 mg total) by mouth 2 (two) times daily with breakfast and lunch.   methylphenidate (RITALIN) 20 MG tablet Take 1 tablet (20 mg total) by mouth 2 (two) times daily with breakfast and lunch.   methylphenidate (RITALIN) 20 MG tablet Take 1 tablet (20 mg total) by mouth 2 (two) times daily with breakfast and lunch.   montelukast (SINGULAIR) 10 MG tablet Take 1 tablet (10 mg total) by mouth at bedtime.   Multiple Vitamin (MULITIVITAMIN WITH MINERALS) TABS Take 1 tablet by mouth daily with breakfast.   nystatin (MYCOSTATIN/NYSTOP) powder Apply 1 application topically 3 (three)  times daily. (Patient taking differently: Apply 1 application  topically 3 (three) times daily as needed.)   Olopatadine HCl 0.2 % SOLN Place 1 drop into both eyes in the morning and at bedtime.   Omega-3 Fatty Acids (FISH OIL) 1000 MG CAPS Take 2,000 mg by mouth.   omeprazole (PRILOSEC) 40 MG capsule Take 1 capsule (40 mg total) by mouth daily.   oxybutynin (DITROPAN-XL) 10 MG 24 hr tablet Take 1 tablet (10 mg total) by mouth at bedtime.   Probiotic Product (TRUBIOTICS PO) Take 1 capsule by mouth daily. Takes 25 cfu daily.   Prucalopride Succinate (MOTEGRITY) 1 MG TABS Take 1 tablet by mouth 2 (two) times daily.   pyridOXINE  (VITAMIN B-6) 100 MG tablet Take 100 mg by mouth daily.   traZODone (DESYREL) 100 MG tablet Take 2 tablets (200 mg total) by mouth at bedtime.   venlafaxine XR (EFFEXOR XR) 150 MG 24 hr capsule Take 1 capsule (150 mg total) by mouth daily with breakfast.   No facility-administered encounter medications on file as of 03/29/2022.    Allergies (verified) Morphine and related and Promethazine hcl   History: Past Medical History:  Diagnosis Date   Allergy    grass, dust , mold   Anxiety    Arthritis    Asthma due to seasonal allergies 06/15/2020   Bipolar disorder ([place])    Carpal tunnel syndrome    Bilateral   Chest pain 09/2011   Cardiac cath-normal coronaries   Constipation    Depression    Difficulty urinating 05/31/2013   Elevated LFTs 12/16/2013   GERD (gastroesophageal reflux disease)    History of kidney stones    Hyperlipemia    Hyperlipidemia    Hypertension    Mild; provoked by stress and anxiety   IBS (irritable bowel syndrome)    Intracerebral bleed ([place])    No aneurysm; followed by Dr. Carolan Shiver replaced    "lense transplant" 2022; pt states lense don't dilate or constrict   Loss of weight 01/06/2015   Osteoporosis    Stage 3a chronic kidney disease ([place]) 03/16/2021   Stroke ([place]) 1999   hemorrhagic stroke; weakness of left side   Past Surgical History:  Procedure Laterality Date   BIOPSY  04/16/2021   Procedure: BIOPSY;  Surgeon: Montez Morita, Quillian Quince, MD;  [facility];  Service: Gastroenterology;;  small, bowel, esophageal(proximal and distal);   [place]   to remove blood clot after stroke    CARDIAC CATHETERIZATION  2016   CERVICAL FUSION     CHOLECYSTECTOMY N/A 10/14/2014   Procedure: LAPAROSCOPIC CHOLECYSTECTOMY WITH INTRAOPERATIVE CHOLANGIOGRAM;  Surgeon: Jackolyn Confer, MD;  Location: [facility];  Service: General;  Laterality: N/A;   CHONDROPLASTY Right 07/13/2017   Procedure: CHONDROPLASTY of patella;  Surgeon:  Carole Civil, MD;  Location: [facility];  Service: Orthopedics;  Laterality: Right;   ESOPHAGEAL DILATION N/A 04/16/2021   Procedure: ESOPHAGEAL DILATION;  Surgeon: Harvel Quale, MD;  [facility];  Service: Gastroenterology;  Laterality: N/A;   ESOPHAGOGASTRODUODENOSCOPY (EGD) WITH PROPOFOL N/A 04/16/2021   Procedure: ESOPHAGOGASTRODUODENOSCOPY (EGD) WITH PROPOFOL;  Surgeon: Harvel Quale, MD;  [facility];  Service: Gastroenterology;  Laterality: N/A;  1:35, pt knows to arrive at 9:45   EUS N/A 08/21/2015   Procedure: ESOPHAGEAL ENDOSCOPIC ULTRASOUND (EUS) RADIAL;  Surgeon: Carol Ada, MD;  [facility]: [facility];  Service: Endoscopy;  Laterality: N/A;   KNEE ARTHROSCOPY WITH MEDIAL MENISECTOMY  Right 07/13/2017   Procedure: KNEE ARTHROSCOPY WITH PARTIAL MEDIAL MENISECTOMY;  Surgeon: Carole Civil, MD;  Location: [facility];  Service: Orthopedics;  Laterality: Right;   LEFT HEART CATHETERIZATION WITH CORONARY ANGIOGRAM N/A 09/23/2011   Procedure: LEFT HEART CATHETERIZATION WITH CORONARY ANGIOGRAM;  Surgeon: Thayer Headings, MD;  Location: [facility];  Service: Cardiovascular;  Laterality: N/A;   LIPOMA EXCISION Left 11/18/2013   Procedure: EXCISION OF SOFT TISSUE MASS-LEFT THIGH;  Surgeon: Jamesetta So, MD;  Location[facility];  Service: General;  Laterality: Left;   NASAL SEPTOPLASTY W/ TURBINOPLASTY Bilateral 08/30/2021   Procedure: NASAL SEPTOPLASTY WITH BILATERAL TURBINATE REDUCTION;  Surgeon: Leta Baptist, MD;  Location: [facility];  Service: ENT;  Laterality: Bilateral;   POLYPECTOMY  04/16/2021   Procedure: POLYPECTOMY;  Surgeon: Harvel Quale, MD;  [facility];  Service: Gastroenterology;;  gastric   RECTOCELE REPAIR     x2   RECTOCELE REPAIR N/A 04/04/2017   Procedure: POSTERIOR REPAIR (RECTOCELE);  Surgeon: Jonnie Kind, MD;  Location: [facility];  Service: Gynecology;  Laterality: N/A;   TOTAL  ABDOMINAL HYSTERECTOMY     Family History  Problem Relation Age of Onset   Cancer Mother        breast    Hypertension Mother    Hyperlipidemia Mother    Depression Mother    Anxiety disorder Mother    COPD Mother    Arthritis Mother        rheumatoid   Drug abuse Sister    Coronary artery disease Paternal Grandfather    Coronary artery disease Paternal Uncle    Depression Cousin    Drug abuse Cousin    Social History   Socioeconomic History   Marital status: Married    Spouse name: Sonia Side   Number of children: 0   Years of education: HS   Highest education level: Not on file  Occupational History   Occupation: unemployed    Comment: pending disability  Tobacco Use   Smoking status: Former    Packs/day: 1.00    Years: 19.00    Total pack years: 19.00    Types: Cigarettes    Quit date: 09/01/1997    Years since quitting: 24.5    Passive exposure: Past   Smokeless tobacco: Never   Tobacco comments:    Quit smoking 1999 , previous 20 pack years  Vaping Use   Vaping Use: Never used  Substance and Sexual Activity   Alcohol use: Yes    Comment: 1 drink every other week   Drug use: No   Sexual activity: Not Currently    Partners: Male    Birth control/protection: Surgical    Comment: hyst   Other Topics Concern   Not on file  Social History Narrative   Currently unable to work   Lives in [place]   Married   Patient drinks 1 cup of caffeine daily.   Patient is right handed.    [facility]ined [facility] [employer] to get more exercise   Social Determinants of Health   Financial Resource Strain: Low Risk  (01/06/2021)   Overall Financial Resource Strain (CARDIA)    Difficulty of Paying Living Expenses: Not hard at all  Food Insecurity: No Food Insecurity (11/11/2021)   Hunger Vital Sign    Worried About Running Out of Food in the Last Year: Never true    Ran Out of Food in the Last Year: Never true  Transportation Needs: No Transportation  Needs (11/11/2021)   PRAPARE -  Hydrologist (Medical): No    Lack of Transportation (Non-Medical): No  Physical Activity: Inactive (01/06/2021)   Exercise Vital Sign    Days of Exercise per Week: 0 days    Minutes of Exercise per Session: 0 min  Stress: No Stress Concern Present (01/06/2021)   [place]    Feeling of Stress : Not at all  Social Connections: Moderately Isolated (01/06/2021)   Social Connection and Isolation Panel [NHANES]    Frequency of Communication with Friends and Family: More than three times a week    Frequency of Social Gatherings with Friends and Family: More than three times a week    Attends Religious Services: Never    Marine scientist or Organizations: No    Attends Archivist Meetings: Never    Marital Status: Married    Tobacco Counseling Counseling given: Not Answered Tobacco comments: Quit smoking 1999 , previous 20 pack years   Clinical Intake:                 Diabetic?no         Activities of Daily Living    08/30/2021    6:50 AM 04/14/2021    2:25 PM  In your present state of health, do you have any difficulty performing the following activities:  Hearing? 0 0  Vision? 0 0  Difficulty concentrating or making decisions? 0 0  Walking or climbing stairs? 1 0  Dressing or bathing? 0 0    Patient Care Team: Lindell Spar, MD as PCP - General (Internal Medicine)  Indicate any recent Medical Services you may have received from other than [facility] providers in the past year (date may be approximate).     Assessment:   This is a routine wellness examination for [place].  Hearing/Vision screen No results found.  Dietary issues and exercise activities discussed:     Goals Addressed   None   Depression Screen    02/15/2022    1:09 PM 11/22/2021   10:54 AM 11/11/2021    4:26 PM 11/11/2021    4:08 PM 11/09/2021   10:59 AM 11/04/2021    4:02 PM 10/22/2021    10:10 AM  PHQ 2/9 Scores  PHQ - 2 Score   0 0 0 3   PHQ- 9 Score     0 3      Information is confidential and restricted. Go to Review Flowsheets to unlock data.     Fall Risk    11/11/2021    4:24 PM 11/11/2021    4:09 PM 11/09/2021   10:59 AM 11/04/2021    4:02 PM 10/07/2021   11:13 AM  Fall Risk   Falls in the past year? 0 0 0 0 0  Number falls in past yr:   0 0 0  Injury with Fall?   0 0 0  Risk for fall due to :   No Fall Risks No Fall Risks   Follow up   Falls evaluation completed Falls evaluation completed     [place]:  Any stairs in or around the home? No  If so, are there any without handrails? No  Home free of loose throw rugs in walkways, pet beds, electrical cords, etc? Yes  Adequate lighting in your home to reduce risk of falls? Yes   ASSISTIVE DEVICES UTILIZED TO  PREVENT FALLS:  Life alert? No  Use of a cane, walker or w/c? No  Grab bars in the bathroom? No  Shower chair or bench in shower? No  Elevated toilet seat or a handicapped toilet? No    Cognitive Function:    02/13/2015    8:33 AM  MMSE - Mini Mental State Exam  Orientation to time 4  Orientation to Place 5  Registration 2  Attention/ Calculation 5  Recall 2  Language- name 2 objects 2  Language- repeat 1  Language- follow 3 step command 3  Language- read & follow direction 1  Write a sentence 1  Copy design 0  Total score 26        Immunizations Immunization History  Administered Date(s) Administered   Influenza Split 08/13/2012, 05/11/2013   Influenza, Quadrivalent, Recombinant, Inj, Pf 03/17/2022   Influenza,inj,Quad PF,6+ Mos 05/11/2015, 03/24/2019, 05/10/2021   Influenza-Unspecified 05/10/2014, 04/17/2017, 03/16/2020   PFIZER(Purple Top)SARS-COV-2 Vaccination 09/26/2019, 10/19/2019, 08/12/2020   PNEUMOCOCCAL CONJUGATE-20 07/07/2021   Pfizer Covid-19 Vaccine Bivalent Booster 48yr & up 05/12/2021   Tdap 04/30/2008, 02/12/2013   Zoster  Recombinat (Shingrix) 03/17/2022    TDAP status: Up to date  Flu Vaccine status: Up to date    Covid-19 vaccine status: Completed vaccines  Qualifies for Shingles Vaccine? Yes   Zostavax completed No   Shingrix Completed?: Yes  Screening Tests Health Maintenance  Topic Date Due   COVID-19 Vaccine (5 - Pfizer risk series) 07/07/2021   Zoster Vaccines- Shingrix (2 of 2) 05/12/2022   MAMMOGRAM  02/12/2023   TETANUS/TDAP  02/13/2023   COLONOSCOPY (Pts 45-470yrInsurance coverage will need to be confirmed)  08/22/2028   INFLUENZA VACCINE  Completed   Hepatitis C Screening  Completed   HIV Screening  Completed   HPV VACCINES  Aged Out    Health Maintenance  Health Maintenance Due  Topic Date Due   COVID-19 Vaccine (5 - Pfizer risk series) 07/07/2021    Colorectal cancer screening: Type of screening: Colonoscopy. Completed 08/22/18. Repeat every 10 years  Mammogram status: Completed 02/11/22. Repeat every year    Lung Cancer Screening: (Low Dose CT Chest recommended if Age 59-80ears, 30 pack-year currently smoking OR have quit w/in 15years.) does not qualify.   Lung Cancer Screening Referral: no  Additional Screening:  Hepatitis C Screening: does qualify; Completed 11/01/21  Vision Screening: Recommended annual ophthalmology exams for early detection of glaucoma and other disorders of the eye. Is the patient up to date with their annual eye exam?  Yes  Who is the provider or what is the name of the office in which the patient attends annual eye exams? N/A If pt is not established with a provider, would they like to be referred to a provider to establish care? No .   Dental Screening: Recommended annual dental exams for proper oral hygiene  Community Resource Referral / Chronic Care Management: CRR required this visit?  No   CCM required this visit?  No      Plan:     I have personally reviewed and noted the following in the patient's chart:   Medical  and social history Use of alcohol, tobacco or illicit drugs  Current medications and supplements including opioid prescriptions. Patient is currently taking opioid prescriptions. Information provided to patient regarding non-opioid alternatives. Patient advised to discuss non-opioid treatment plan with their provider. Functional ability and status Nutritional status Physical activity Advanced directives List of other physicians Hospitalizations, surgeries, and ER visits in  previous 12 months Vitals Screenings to include cognitive, depression, and falls Referrals and appointments  In addition, I have reviewed and discussed with patient certain preventive protocols, quality metrics, and best practice recommendations. A written personalized care plan for preventive services as well as general preventive health recommendations were provided to patient.     Quentin Angst, [place]   03/29/2022

## 2022-03-29 NOTE — Telephone Encounter (Signed)
It is in your stack to look at in your office upon return.  Dr. Jenetta Downer is out of the office and will not return until the end of the week next week. He will review and reach out to up his return. ===View-only below this line===   ----- Message -----      From:Azrielle L Speros "[name]"      Sent:03/29/2022 11:31 AM EDT        QL:[id number] Montez Morita, MD   Subject:breath test   Dr. Loletha Grayer I have spoke to [facility] to set up the esophageal mono entry test. And I was told you received the results on 9 /20. But you have not posted the results yet. Also I was told that  since I have such a bad gag reflex, I could ask you for a light sedation during this test. Please advise. If you have not received my results I need to call them back.

## 2022-03-29 NOTE — Telephone Encounter (Signed)
sent 

## 2022-03-29 NOTE — Patient Instructions (Signed)
    Harkey , Thank you for taking time to come for your Medicare Wellness Visit. I appreciate your ongoing commitment to your health goals. Please review the following plan we discussed and let me know if I can assist you in the future.   These are the goals we discussed:  Goals      Exercise 150 minutes per week (moderate activity)     Medication Management     Patient Goals/Self-Care Activities Over the next 90 days, patient will:  Take medications as prescribed Check blood pressure at least once daily, document, and provide at future appointments Target a minimum of 150 minutes of moderate intensity exercise weekly       Patient Stated     Figure out what's causing side pain, follows with GI        This is a list of the screening recommended for you and due dates:  Health Maintenance  Topic Date Due   COVID-19 Vaccine (5 - Pfizer risk series) 07/07/2021   Zoster (Shingles) Vaccine (2 of 2) 05/12/2022   Mammogram  02/12/2023   Tetanus Vaccine  02/13/2023   Colon Cancer Screening  08/22/2028   Flu Shot  Completed   Hepatitis C Screening: USPSTF Recommendation to screen - Ages 18-79 yo.  Completed   HIV Screening  Completed   HPV Vaccine  Aged Out

## 2022-03-29 NOTE — Telephone Encounter (Signed)
Patient called stating she would like for provider to please send her Ritalin [address] [facility] [id number] [place] not have them in stock for this month.

## 2022-03-31 ENCOUNTER — Other Ambulatory Visit: Payer: Self-pay | Admitting: Internal Medicine

## 2022-03-31 ENCOUNTER — Encounter: Payer: Self-pay | Admitting: Internal Medicine

## 2022-03-31 DIAGNOSIS — K219 Gastro-esophageal reflux disease without esophagitis: Secondary | ICD-10-CM

## 2022-03-31 MED ORDER — OMEPRAZOLE 40 MG PO CPDR: 40.0000 mg | DELAYED_RELEASE_CAPSULE | Freq: Two times a day (BID) | ORAL | 5 refills | Status: DC

## 2022-04-03 ENCOUNTER — Other Ambulatory Visit: Payer: Self-pay | Admitting: Orthopedic Surgery

## 2022-04-04 ENCOUNTER — Ambulatory Visit: Payer: PPO | Admitting: Physical Therapy

## 2022-04-05 ENCOUNTER — Encounter: Payer: Self-pay | Admitting: Physical Therapy

## 2022-04-08 ENCOUNTER — Encounter: Payer: Self-pay | Admitting: Internal Medicine

## 2022-04-08 ENCOUNTER — Other Ambulatory Visit (INDEPENDENT_AMBULATORY_CARE_PROVIDER_SITE_OTHER): Payer: Self-pay | Admitting: Gastroenterology

## 2022-04-08 DIAGNOSIS — K63829 Intestinal methanogen overgrowth, unspecified: Secondary | ICD-10-CM

## 2022-04-08 MED ORDER — NEOMYCIN SULFATE 500 MG PO TABS: 500.0000 mg | ORAL_TABLET | Freq: Two times a day (BID) | ORAL | 0 refills | Status: AC

## 2022-04-08 MED ORDER — RIFAXIMIN 550 MG PO TABS: 550.0000 mg | ORAL_TABLET | Freq: Three times a day (TID) | ORAL | 0 refills | Status: AC

## 2022-04-08 NOTE — Telephone Encounter (Signed)
I received the results from the breath test performed on 03/21/2022 which showed increased production of methane up to 67 ppm in 15 minutes with a secondary peak of 103 bpm at 90 minutes consistent with Archaea SIMO  We will send a prescription for a combination of rifaximin and neomycin for 2 weeks.  Patient understood and agreed.

## 2022-04-11 NOTE — Telephone Encounter (Signed)
Samples of Xifaxan were given to the patient.

## 2022-04-12 ENCOUNTER — Other Ambulatory Visit: Payer: Self-pay | Admitting: Orthopaedic Surgery

## 2022-04-12 ENCOUNTER — Encounter: Payer: Self-pay | Admitting: Orthopaedic Surgery

## 2022-04-12 ENCOUNTER — Encounter: Payer: Self-pay | Admitting: Orthopedic Surgery

## 2022-04-12 MED ORDER — TRAMADOL HCL 50 MG PO TABS: 50.0000 mg | ORAL_TABLET | Freq: Two times a day (BID) | ORAL | 0 refills | Status: DC | PRN

## 2022-04-12 NOTE — Progress Notes (Signed)
Follow on her tailbone with ecchymosis.  She is able to ambulate but has pain when she bends or twists.  She is requesting something for pain.  She requested muscle relaxant but is already on Xanax 1 mg 90 tablets monthly.  Ultram 20 tablets 1 p.o. every 12 as needed pain prescribed.  She can follow-up with me in a few weeks if she is having persistent symptoms.

## 2022-04-12 NOTE — Telephone Encounter (Signed)
noted 

## 2022-04-13 ENCOUNTER — Encounter: Payer: Self-pay | Admitting: Internal Medicine

## 2022-04-13 NOTE — Telephone Encounter (Signed)
noted 

## 2022-04-13 NOTE — Telephone Encounter (Signed)
Pt does not want an appt scheduled

## 2022-04-15 ENCOUNTER — Other Ambulatory Visit: Payer: Self-pay

## 2022-04-15 ENCOUNTER — Emergency Department (HOSPITAL_COMMUNITY): Payer: PPO

## 2022-04-15 ENCOUNTER — Encounter (HOSPITAL_COMMUNITY): Payer: Self-pay | Admitting: *Deleted

## 2022-04-15 ENCOUNTER — Ambulatory Visit: Payer: PPO | Admitting: Obstetrics and Gynecology

## 2022-04-15 ENCOUNTER — Emergency Department (HOSPITAL_COMMUNITY)
Admission: EM | Admit: 2022-04-15 | Discharge: 2022-04-15 | Disposition: A | Discharge status: Home / Self Care | Payer: PPO | Attending: Emergency Medicine | Admitting: Emergency Medicine

## 2022-04-15 DIAGNOSIS — M160 Bilateral primary osteoarthritis of hip: Secondary | ICD-10-CM | POA: Diagnosis not present

## 2022-04-15 DIAGNOSIS — W01198A Fall on same level from slipping, tripping and stumbling with subsequent striking against other object, initial encounter: Secondary | ICD-10-CM | POA: Diagnosis not present

## 2022-04-15 DIAGNOSIS — Z79899 Other long term (current) drug therapy: Secondary | ICD-10-CM | POA: Insufficient documentation

## 2022-04-15 DIAGNOSIS — R918 Other nonspecific abnormal finding of lung field: Secondary | ICD-10-CM | POA: Diagnosis not present

## 2022-04-15 DIAGNOSIS — I10 Essential (primary) hypertension: Secondary | ICD-10-CM | POA: Diagnosis not present

## 2022-04-15 DIAGNOSIS — M545 Low back pain, unspecified: Secondary | ICD-10-CM | POA: Diagnosis not present

## 2022-04-15 DIAGNOSIS — S32020A Wedge compression fracture of second lumbar vertebra, initial encounter for closed fracture: Secondary | ICD-10-CM | POA: Insufficient documentation

## 2022-04-15 DIAGNOSIS — M25551 Pain in right hip: Secondary | ICD-10-CM | POA: Insufficient documentation

## 2022-04-15 DIAGNOSIS — M533 Sacrococcygeal disorders, not elsewhere classified: Secondary | ICD-10-CM | POA: Diagnosis not present

## 2022-04-15 DIAGNOSIS — J9811 Atelectasis: Secondary | ICD-10-CM | POA: Diagnosis not present

## 2022-04-15 DIAGNOSIS — S299XXA Unspecified injury of thorax, initial encounter: Secondary | ICD-10-CM | POA: Diagnosis present

## 2022-04-15 MED ORDER — HYDROCODONE-ACETAMINOPHEN 5-325 MG PO TABS: 1.0000 | ORAL_TABLET | ORAL | 0 refills | Status: DC | PRN

## 2022-04-15 MED ORDER — HYDROCODONE-ACETAMINOPHEN 5-325 MG PO TABS
1.0000 | ORAL_TABLET | Freq: Once | ORAL | Status: AC
  Administered 2022-04-15: 1 via ORAL
  Filled 2022-04-15: qty 1

## 2022-04-15 NOTE — Discharge Instructions (Signed)
Wear the brace provided to protect your back.  Avoid any activity that worsens your symptoms. You may take the hydrocodone prescribed for pain relief.  This will make you drowsy - do not drive within 4 hours of taking this medication.

## 2022-04-15 NOTE — ED Provider Notes (Signed)
[facility] EMERGENCY DEPARTMENT Provider Note   CSN: [id number] Arrival date & time: 04/15/22  1013     History {Add pertinent medical, surgical, social history, OB history to HPI:1} Chief Complaint  Patient presents with   [name] is a 61 y.o. female with a history including hypertension, GERD, IBS, history of CVA with residual left-sided weakness, osteoporosis and hyperlipidemia presenting for evaluation of persistent pain after losing her balance and falling 3 days ago.  She states she was getting out of bed in the morning, she usually uses her dresser to balance her, return to pet her dog who is on the bed and fell landing against her dresser and another table.  She hit her left shoulder blade region on her dresser which she states is bruised but is no longer painful, landed directly on her buttocks and hit her right rib cage area against a table.  She has persistent pain along her right rib cage area, also her right hip midline lower back and coccyx region.  She reached out to her PCP and also her orthopedist, tramadol was called in for her which she has been taking but states she is not getting pain relief with this medication.  She denies weakness or numbness in her lower extremities as new or increased symptoms since her fall.  Pain is worsened with sitting and movement after being stationary.  She has also been applying heat to these areas with no improvement in her symptoms.  No head injury.  No chronic anticoagulants.  The history is provided by the patient.       Home Medications Prior to Admission medications   Medication Sig Start Date End Date Taking? Authorizing Provider  acetaminophen (TYLENOL) 500 MG tablet Take 500-1,000 mg by mouth every 6 (six) hours as needed for moderate pain.    [provider]  ALPRAZolam Duanne Moron) 1 MG tablet Take 1 tablet (1 mg total) by mouth 3 (three) times daily. 02/15/22   Cloria Spring, MD  atorvastatin (LIPITOR)  40 MG tablet TAKE ONE TABLET BY MOUTH ONCE DAILY. 03/21/22   Lindell Spar, MD  Calcium 500-125 MG-UNIT TABS Take 1 tablet by mouth daily with supper.    [provider]  Carboxymethylcellulose Sod PF 0.5 % SOLN Place 1 drop into both eyes daily as needed (dry eyes).    [provider]  cyclobenzaprine (FLEXERIL) 5 MG tablet Take 1 tablet (5 mg total) by mouth 3 (three) times daily as needed. for muscle spams 10/13/21   Jaquita Folds, MD  diclofenac (CATAFLAM) 50 MG tablet TAKE (1) TABLET BY MOUTH TWICE DAILY. 04/04/22   Carole Civil, MD  diclofenac Sodium (VOLTAREN) 1 % GEL Apply 1 application topically daily as needed (pain).    [provider]  dicyclomine (BENTYL) 10 MG capsule TAKE ONE CAPSULE EVERY 12 HOURS AS NEEDED 06/22/21   Montez Morita, Quillian Quince, MD  estradiol (ESTRACE) 0.1 MG/GM vaginal cream Place 0.5 g vaginally 2 (two) times a week. Place 0.5g nightly for two weeks then twice a week after 07/22/21   Jaquita Folds, MD  ezetimibe (ZETIA) 10 MG tablet Take 1 tablet (10 mg total) by mouth daily. 05/19/21   Lindell Spar, MD  famotidine (PEPCID) 20 MG tablet Take 20 mg by mouth daily as needed.    [provider]  lidocaine-prilocaine (EMLA) cream Apply 1 application. topically as needed. Place on vagina. Wipe off excess after a few minutes  10/13/21   Jaquita Folds, MD  lisinopril (ZESTRIL) 20 MG tablet TAKE 1 TABLET BY MOUTH DAILY. 01/17/22   Lindell Spar, MD  loratadine (CLARITIN) 10 MG tablet Take 10 mg by mouth daily.    [provider]  methylphenidate (RITALIN) 20 MG tablet Take 1 tablet (20 mg total) by mouth 2 (two) times daily with breakfast and lunch. 02/17/22 02/17/23  Cloria Spring, MD  methylphenidate (RITALIN) 20 MG tablet Take 1 tablet (20 mg total) by mouth 2 (two) times daily with breakfast and lunch. 02/17/22 02/17/23  Cloria Spring, MD  methylphenidate (RITALIN) 20 MG tablet Take 1 tablet (20  mg total) by mouth 2 (two) times daily with breakfast and lunch. 03/29/22 03/29/23  Cloria Spring, MD  montelukast (SINGULAIR) 10 MG tablet TAKE (1) TABLET BY MOUTH AT BEDTIME. 03/30/22   Althea Charon, FNP  Multiple Vitamin (MULITIVITAMIN WITH MINERALS) TABS Take 1 tablet by mouth daily with breakfast.    [provider]  neomycin (MYCIFRADIN) 500 MG tablet Take 1 tablet (500 mg total) by mouth 2 (two) times daily for 14 days. 04/08/22 04/22/22  Harvel Quale, MD  nystatin (MYCOSTATIN/NYSTOP) powder Apply 1 application topically 3 (three) times daily. Patient taking differently: Apply 1 application  topically 3 (three) times daily as needed. 01/28/21   Lindell Spar, MD  Olopatadine HCl 0.2 % SOLN Place 1 drop into both eyes in the morning and at bedtime.    [provider]  Omega-3 Fatty Acids (FISH OIL) 1000 MG CAPS Take 2,000 mg by mouth.    [provider]  omeprazole (PRILOSEC) 40 MG capsule Take 1 capsule (40 mg total) by mouth 2 (two) times daily. 03/31/22   Lindell Spar, MD  oxybutynin (DITROPAN-XL) 10 MG 24 hr tablet Take 1 tablet (10 mg total) by mouth at bedtime. 07/26/21   Jaquita Folds, MD  Probiotic Product (TRUBIOTICS PO) Take 1 capsule by mouth daily. Takes 25 cfu daily.    [provider]  Prucalopride Succinate (MOTEGRITY) 1 MG TABS Take 1 tablet by mouth 2 (two) times daily. 12/27/21   Harvel Quale, MD  pyridOXINE (VITAMIN B-6) 100 MG tablet Take 100 mg by mouth daily.    [provider]  rifaximin (XIFAXAN) 550 MG TABS tablet Take 1 tablet (550 mg total) by mouth 3 (three) times daily for 14 days. 04/08/22 04/22/22  Harvel Quale, MD  traMADol (ULTRAM) 50 MG tablet Take 1 tablet (50 mg total) by mouth every 12 (twelve) hours as needed. 04/12/22   Marybelle Killings, MD  traZODone (DESYREL) 100 MG tablet Take 2 tablets (200 mg total) by mouth at bedtime. 02/15/22   Cloria Spring, MD  venlafaxine  XR (EFFEXOR XR) 150 MG 24 hr capsule Take 1 capsule (150 mg total) by mouth daily with breakfast. 02/15/22   Cloria Spring, MD      Allergies    Morphine and related and Promethazine hcl    Review of Systems   Review of Systems  Constitutional:  Negative for fever.  Musculoskeletal:  Positive for arthralgias. Negative for joint swelling and myalgias.  Skin:  Negative for wound.  Neurological:  Negative for weakness and numbness.       No new weakness or numbness in her extremities.    Physical Exam Updated Vital Signs BP (!) 147/88 (BP Location: Right Arm)   Pulse 79   Temp 97.8 F (36.6 C) (Oral)   Resp  18   Ht 5' 4"  (1.626 m)   Wt 62.6 kg   SpO2 94%   BMI 23.69 kg/m  Physical Exam Vitals and nursing note reviewed.  Constitutional:      Appearance: Diamond L Pricer "[name]" is well-developed.  HENT:     Head: Normocephalic and atraumatic.  Eyes:     Conjunctiva/sclera: Conjunctivae normal.  Cardiovascular:     Rate and Rhythm: Normal rate and regular rhythm.     Heart sounds: Normal heart sounds.  Pulmonary:     Effort: Pulmonary effort is normal.     Breath sounds: Normal breath sounds. No wheezing.  Abdominal:     General: There is no distension.     Palpations: Abdomen is soft.     Tenderness: There is no abdominal tenderness.  Musculoskeletal:        General: Tenderness present. No swelling or deformity. Normal range of motion.     Cervical back: Neck supple.     Lumbar back: Bony tenderness present. No swelling or deformity.       Back:     Right hip: Bony tenderness present. No deformity.     Left hip: No deformity.     Comments: To palpation midline lumbar through coccyx region.  No palpable deformities or bruising noted.  Faint bruising noted across the bridge of the left scapula, nontender.  Tender to palpation at right greater trochanter.  Skin:    General: Skin is warm and dry.  Neurological:     Mental Status: Diamond L Vigeant "[name]" is alert.      ED Results / Procedures / Treatments   Labs (all labs ordered are listed, but only abnormal results are displayed) Labs Reviewed - No data to display  EKG None  Radiology No results found.  Procedures Procedures  {Document cardiac monitor, telemetry assessment procedure when appropriate:1}  Medications Ordered in ED Medications  HYDROcodone-acetaminophen (NORCO/VICODIN) 5-325 MG per tablet 1 tablet (has no administration in time range)    ED Course/ Medical Decision Making/ A&P                           Medical Decision Making Amount and/or Complexity of Data Reviewed Radiology: ordered.  Risk Prescription drug management.     {Document critical care time when appropriate:1} {Document review of labs and clinical decision tools ie heart score, Chads2Vasc2 etc:1}  {Document your independent review of radiology images, and any outside records:1} {Document your discussion with family members, caretakers, and with consultants:1} {Document social determinants of health affecting pt's care:1} {Document your decision making why or why not admission, treatments were needed:1} Final Clinical Impression(s) / ED Diagnoses Final diagnoses:  None    Rx / DC Orders ED Discharge Orders     None

## 2022-04-15 NOTE — Progress Notes (Signed)
Orthopedic Tech Progress Note Patient Details:  [name] 03/01/1961 [id number]  Patient ID: [name], female   DOB: 04/03/1961, 61 y.o.   MRN: [id number] Order placed with [facility] for TLSO brace. Vernona Rieger 04/15/2022, 1:52 PM

## 2022-04-15 NOTE — ED Notes (Signed)
Awaiting TLSO to be delivered to the ED

## 2022-04-15 NOTE — ED Triage Notes (Signed)
Pt states she got up out of bed and lost her balance causing her to hit her left shoulder blade and land on her bottom; pt states the pain has gotten increasingly worse and has a bruise to left shoulder blade and bottom of buttocks with pain more to right hip

## 2022-04-18 ENCOUNTER — Ambulatory Visit: Payer: PPO | Attending: Obstetrics and Gynecology | Admitting: Physical Therapy

## 2022-04-18 DIAGNOSIS — M62838 Other muscle spasm: Secondary | ICD-10-CM | POA: Insufficient documentation

## 2022-04-18 DIAGNOSIS — M6281 Muscle weakness (generalized): Secondary | ICD-10-CM | POA: Insufficient documentation

## 2022-04-18 DIAGNOSIS — R102 Pelvic and perineal pain: Secondary | ICD-10-CM | POA: Insufficient documentation

## 2022-04-19 DIAGNOSIS — M5459 Other low back pain: Secondary | ICD-10-CM | POA: Diagnosis not present

## 2022-04-20 ENCOUNTER — Telehealth: Payer: Self-pay

## 2022-04-20 NOTE — Telephone Encounter (Signed)
     Patient  visit on 10/13  at [facility] you been able to follow up with your primary care physician? yes  The patient was or was not able to obtain any needed medicine or equipment. yes  Are there diet recommendations that you are having difficulty following? na  Patient expresses understanding of discharge instructions and education provided has no other needs at this time.  yes    [facility], Care Management  [phone] [address], [place], [place] [address] Phone: [phone] Email: [email or web address]

## 2022-04-21 ENCOUNTER — Encounter: Payer: Self-pay | Admitting: Orthopaedic Surgery

## 2022-04-24 DIAGNOSIS — M5459 Other low back pain: Secondary | ICD-10-CM | POA: Diagnosis not present

## 2022-04-27 ENCOUNTER — Encounter (INDEPENDENT_AMBULATORY_CARE_PROVIDER_SITE_OTHER): Payer: Self-pay

## 2022-04-27 NOTE — Telephone Encounter (Signed)
noted 

## 2022-04-28 ENCOUNTER — Ambulatory Visit (INDEPENDENT_AMBULATORY_CARE_PROVIDER_SITE_OTHER): Payer: PPO | Admitting: Orthopaedic Surgery

## 2022-04-28 ENCOUNTER — Ambulatory Visit (INDEPENDENT_AMBULATORY_CARE_PROVIDER_SITE_OTHER): Payer: PPO | Admitting: Gastroenterology

## 2022-04-28 ENCOUNTER — Encounter: Payer: Self-pay | Admitting: Orthopaedic Surgery

## 2022-04-28 ENCOUNTER — Encounter (INDEPENDENT_AMBULATORY_CARE_PROVIDER_SITE_OTHER): Payer: Self-pay | Admitting: Gastroenterology

## 2022-04-28 VITALS — BP 124/75 | HR 80 | Temp 97.8°F | Ht 64.0 in | Wt 140.5 lb

## 2022-04-28 VITALS — Ht 64.0 in | Wt 135.0 lb

## 2022-04-28 DIAGNOSIS — K582 Mixed irritable bowel syndrome: Secondary | ICD-10-CM

## 2022-04-28 DIAGNOSIS — S32010S Wedge compression fracture of first lumbar vertebra, sequela: Secondary | ICD-10-CM | POA: Diagnosis not present

## 2022-04-28 DIAGNOSIS — R1319 Other dysphagia: Secondary | ICD-10-CM | POA: Diagnosis not present

## 2022-04-28 DIAGNOSIS — S32020A Wedge compression fracture of second lumbar vertebra, initial encounter for closed fracture: Secondary | ICD-10-CM | POA: Diagnosis not present

## 2022-04-28 DIAGNOSIS — K63829 Intestinal methanogen overgrowth, unspecified: Secondary | ICD-10-CM

## 2022-04-28 NOTE — Patient Instructions (Signed)
Please call back to the office once you have stopped the pain medications or if trouble swallowing worsens

## 2022-04-28 NOTE — Progress Notes (Signed)
Office Visit Note   Patient: [name]           Date of Birth: [birth year]           MRN: [id number] Visit Date: 04/28/2022              Requested by: Lindell Spar, MD [address] [place],  [place] [address] PCP: Lindell Spar, MD   Assessment & Plan: Visit Diagnoses: L2 fracture  Plan: Patient has old L1 compression fracture healed.  New L2 compression fracture with minimal loss of height and no retropulsion.  I think she can stay with the brace we will get a bone density test she likely needs to be additionally on some vitamin D in addition to calcium she is already taking and may need Fosamax treatment.  She can follow-up in 1 month and I will call her with results of bone density.  Follow-Up Instructions: No follow-ups on file.   Orders:  No orders of the defined types were placed in this encounter.  No orders of the defined types were placed in this encounter.     Procedures: No procedures performed   Clinical Data: No additional findings.   Subjective: Chief Complaint  Patient presents with   Lower Back - Fracture    Fall 04/12/2022    HPI patient called and asked to be seen.  She has old history of L1 compression fracture from a fall few years ago which healed nicely.  She had another fall 04/12/2022 has an L2 compression fracture seen on imaging studies and states she was seen elsewhere and they recommended vertebroplasty or kyphoplasty.  Patient had been seen in our clinic states she wants to stay with the same orthopedic group she has been using a brace states that the exam to the pubic bone.  She has CD with her of the MRI of her lumbar spine.  She has had some problems with GI problems has used some tramadol she has stage III kidney disease but cannot take plain Tylenol.  No associated bowel or bladder symptoms.  Review of Systems history of stage III kidney disease, fatty liver.  Current acute L2 compression fracture after fall old L1 compression  fracture.   Objective: Vital Signs: Ht 5' 4"  (1.626 m)   Wt 135 lb (61.2 kg)   BMI 23.17 kg/m   Physical Exam Constitutional:      Appearance: [name] "[name]" is well-developed.  HENT:     Head: Normocephalic.     Right Ear: External ear normal.     Left Ear: External ear normal. There is no impacted cerumen.  Eyes:     Pupils: Pupils are equal, round, and reactive to light.  Neck:     Thyroid: No thyromegaly.     Trachea: No tracheal deviation.  Cardiovascular:     Rate and Rhythm: Normal rate.  Pulmonary:     Effort: Pulmonary effort is normal.  Abdominal:     Palpations: Abdomen is soft.  Musculoskeletal:     Cervical back: No rigidity.  Skin:    General: Skin is warm and dry.  Neurological:     Mental Status: [name] "[name]" is alert and oriented to person, place, and time.  Psychiatric:        Behavior: Behavior normal.     Ortho Exam lower extremities are intact she is amatory.  Specialty Comments:  No specialty comments available.  Imaging: No results found.  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   History of hemorrhagic stroke with residual hemiplegia ([facility]) 06/15/2020   Urinary incontinence 06/15/2020   Asthma due to seasonal allergies 06/15/2020   Vaginal itching 03/16/2020   S/P right knee arthroscopy 07/13/17 07/20/2017   Derangement of posterior horn of medial meniscus of right knee    Chondromalacia, patella, right    IBS  (irritable bowel syndrome) 05/11/2015   Gait instability 01/06/2015   DDD (degenerative disc disease), lumbar 02/18/2014   Postmenopausal atrophic vaginitis 11/01/2013   Lipoma of abdominal wall 08/14/2013   Back pain 05/29/2013   Paresthesia of foot 02/12/2013   Hypertension    GERD (gastroesophageal reflux disease)    Depression 11/17/2011   Insomnia 11/17/2011   Mixed hyperlipidemia 09/20/2011   Anxiety 09/20/2011   Past Medical History:  Diagnosis Date   Allergy    grass, dust , mold   Anxiety    Arthritis    Asthma due to seasonal allergies 06/15/2020   Bipolar disorder ([facility])    Carpal tunnel syndrome    Bilateral   Chest pain 09/2011   Cardiac cath-normal coronaries   Constipation    Depression    Difficulty urinating 05/31/2013   Elevated LFTs 12/16/2013   GERD (gastroesophageal reflux disease)    History of kidney stones    Hyperlipemia    Hyperlipidemia    Hypertension    Mild; provoked by stress and anxiety   IBS (irritable bowel syndrome)    Intracerebral bleed ([facility])    No aneurysm; followed by Dr. Carolan Shiver replaced    "lense transplant" 2022; pt states lense don't dilate or constrict   Loss of weight 01/06/2015   Osteoporosis    Stage 3a chronic kidney disease ([facility]) 03/16/2021   Stroke ([facility]) 1999   hemorrhagic stroke; weakness of left side    Family History  Problem Relation Age of Onset   Cancer Mother        breast    Hypertension Mother    Hyperlipidemia Mother    Depression Mother    Anxiety disorder Mother    COPD Mother    Arthritis Mother        rheumatoid   Drug abuse Sister    Coronary artery disease Paternal Grandfather    Coronary artery disease Paternal Uncle    Depression Cousin    Drug abuse Cousin     Past Surgical History:  Procedure Laterality Date   BIOPSY  04/16/2021   Procedure: BIOPSY;  Surgeon: Harvel Quale, MD;  [facility]: [facility];  Service: Gastroenterology;;  small, bowel,  esophageal(proximal and distal);   BRAIN SURGERY  1999   to remove blood clot after stroke    CARDIAC CATHETERIZATION  2016   CERVICAL FUSION     CHOLECYSTECTOMY N/A 10/14/2014   Procedure: LAPAROSCOPIC CHOLECYSTECTOMY WITH INTRAOPERATIVE CHOLANGIOGRAM;  Surgeon: Jackolyn Confer, MD;  Location: [facility];  Service: General;  Laterality: N/A;   CHONDROPLASTY Right 07/13/2017   Procedure: CHONDROPLASTY of patella;  Surgeon: Aline Brochure,  Tim Lair, MD;  Location: [facility];  Service: Orthopedics;  Laterality: Right;   ESOPHAGEAL DILATION N/A 04/16/2021   Procedure: ESOPHAGEAL DILATION;  Surgeon: Harvel Quale, MD;  [facility];  Service: Gastroenterology;  Laterality: N/A;   ESOPHAGOGASTRODUODENOSCOPY (EGD) WITH PROPOFOL N/A 04/16/2021   Procedure: ESOPHAGOGASTRODUODENOSCOPY (EGD) WITH PROPOFOL;  Surgeon: Harvel Quale, MD;  [facility];  Service: Gastroenterology;  Laterality: N/A;  1:35, pt knows to arrive at 9:45   EUS N/A 08/21/2015   Procedure: ESOPHAGEAL ENDOSCOPIC ULTRASOUND (EUS) RADIAL;  Surgeon: Carol Ada, MD;  Location: [facility];  Service: Endoscopy;  Laterality: N/A;   KNEE ARTHROSCOPY WITH MEDIAL MENISECTOMY Right 07/13/2017   Procedure: KNEE ARTHROSCOPY WITH PARTIAL MEDIAL MENISECTOMY;  Surgeon: Carole Civil, MD;  Location: [facility];  Service: Orthopedics;  Laterality: Right;   LEFT HEART CATHETERIZATION WITH CORONARY ANGIOGRAM N/A 09/23/2011   Procedure: LEFT HEART CATHETERIZATION WITH CORONARY ANGIOGRAM;  Surgeon: Thayer Headings, MD;  Location: [facility];  Service: Cardiovascular;  Laterality: N/A;   LIPOMA EXCISION Left 11/18/2013   Procedure: EXCISION OF SOFT TISSUE MASS-LEFT THIGH;  Surgeon: Jamesetta So, MD;  Location: [facility];  Service: General;  Laterality: Left;   NASAL SEPTOPLASTY W/ TURBINOPLASTY Bilateral 08/30/2021   Procedure: NASAL SEPTOPLASTY WITH BILATERAL TURBINATE REDUCTION;  Surgeon: Leta Baptist, MD;  Location:  [facility];  Service: ENT;  Laterality: Bilateral;   POLYPECTOMY  04/16/2021   Procedure: POLYPECTOMY;  Surgeon: Harvel Quale, MD;  [facility];  Service: Gastroenterology;;  gastric   RECTOCELE REPAIR     x2   RECTOCELE REPAIR N/A 04/04/2017   Procedure: POSTERIOR REPAIR (RECTOCELE);  Surgeon: Jonnie Kind, MD;  Location: [facility];  Service: Gynecology;  Laterality: N/A;   TOTAL ABDOMINAL HYSTERECTOMY     Social History   Occupational History   Occupation: unemployed    Comment: pending disability  Tobacco Use   Smoking status: Former    Packs/day: 1.00    Years: 19.00    Total pack years: 19.00    Types: Cigarettes    Quit date: 09/01/1997    Years since quitting: 24.6    Passive exposure: Past   Smokeless tobacco: Never   Tobacco comments:    Quit smoking 1999 , previous 20 pack years  Vaping Use   Vaping Use: Never used  Substance and Sexual Activity   Alcohol use: Yes    Comment: 1 drink every other week   Drug use: No   Sexual activity: Not Currently    Partners: Male    Birth control/protection: Surgical    Comment: hyst

## 2022-04-28 NOTE — Progress Notes (Signed)
Maylon Peppers, M.D. Gastroenterology & Hepatology [facility] [address] [place], [place] [address]  Primary Care Physician: Lindell Spar, MD [address] [place] [address]  I will communicate my assessment and recommendations to the referring MD via EMR.  Problems: SIMO IBS-M NASH  History of Present Illness: [name] is a 60 y.o. female with past medical history of bipolar disorder, cervical cancer, depression, hyperlipidemia, hypertension, IBS, stroke, SIMO, CKD stage III and NASH, who presents for follow up of SIMO.  The patient was last seen on 12/27/2021. At that time, the patient was advised to stop MiraLAX and to start [place].  The patient reported that she was having significant symptoms and could not afford many of the medications.  She finally agreed on proceeding with breath test to rule out SIMO.  Breath test was performed on 03/21/2022 which came back positive for  [place].  She finished her 2 week course of Xifaxan and neomycin. States the bloating completely resolved.  Patient reports that she had a recent fall and had a fracture at the L2 level.   She was put on a brace. She was seen today by orthopedics for follow-up.She was put on hydrocodone since then. She doubled up the dose of her Benefiber and Miralax, which has kept her regular to have bowel movements. She is having a BM every morning.  She reports having hiccups after eating. Sometimes she feels that the food is "stuck in her throat". No odynophagia.  The patient denies having any nausea, vomiting, fever, chills, hematochezia, melena, hematemesis, abdominal distention, abdominal pain, diarrhea, jaundice, pruritus or weight loss.  Last EGD: 04/16/2021, no alterations were found in the esophagus, this was empirically dilated with a Savary dilator up to 18 mm.  Biopsies from esophagus were within normal limits.  There were a few erosions in  the stomach.  Biopsies were negative for H. pylori.  A 12 mm polyp was snared from the gastric fundus which showed fundic gland polyp with low-grade dysplasia.  Duodenum was normal with biopsies negative for any adenopathies.  Last Colonoscopy: 08/2018 -performed by Dr. Carol Ada.  Per report within normal limits, had biopsies of the right and left colon which were within normal limits. Last EUS 2017 - The pancreatic parenchyma was normal as well as the PD caliber. The PD was very small and difficult to measure. The CBD was normal in size (4.3 mm) and there was no evidence of any stones. No gallbladder was visualized, which is expected in her post cholecystectomy state. No other abnormalities were identified.  Past Medical History: Past Medical History:  Diagnosis Date   Allergy    grass, dust , mold   Anxiety    Arthritis    Asthma due to seasonal allergies 06/15/2020   Bipolar disorder ([place])    Carpal tunnel syndrome    Bilateral   Chest pain 09/2011   Cardiac cath-normal coronaries   Constipation    Depression    Difficulty urinating 05/31/2013   Elevated LFTs 12/16/2013   GERD (gastroesophageal reflux disease)    History of kidney stones    Hyperlipemia    Hyperlipidemia    Hypertension    Mild; provoked by stress and anxiety   IBS (irritable bowel syndrome)    Intracerebral bleed ([place])    No aneurysm; followed by Dr. Carolan Shiver replaced    "lense transplant" 2022; pt states lense don't dilate or constrict  Loss of weight 01/06/2015   Osteoporosis    Stage 3a chronic kidney disease ([place]) 03/16/2021   Stroke ([place]) 1999   hemorrhagic stroke; weakness of left side    Past Surgical History: Past Surgical History:  Procedure Laterality Date   BIOPSY  04/16/2021   Procedure: BIOPSY;  Surgeon: Montez Morita, Quillian Quince, MD;  [facility]: [facility];  Service: Gastroenterology;;  small, bowel, esophageal(proximal and distal);   BRAIN SURGERY  1999   to remove blood  clot after stroke    CARDIAC CATHETERIZATION  2016   CERVICAL FUSION     CHOLECYSTECTOMY N/A 10/14/2014   Procedure: LAPAROSCOPIC CHOLECYSTECTOMY WITH INTRAOPERATIVE CHOLANGIOGRAM;  Surgeon: Jackolyn Confer, MD;  Location: [facility];  Service: General;  Laterality: N/A;   CHONDROPLASTY Right 07/13/2017   Procedure: CHONDROPLASTY of patella;  Surgeon: Carole Civil, MD;  Location: [facility];  Service: Orthopedics;  Laterality: Right;   ESOPHAGEAL DILATION N/A 04/16/2021   Procedure: ESOPHAGEAL DILATION;  Surgeon: Harvel Quale, MD;  [facility];  Service: Gastroenterology;  Laterality: N/A;   ESOPHAGOGASTRODUODENOSCOPY (EGD) WITH PROPOFOL N/A 04/16/2021   Procedure: ESOPHAGOGASTRODUODENOSCOPY (EGD) WITH PROPOFOL;  Surgeon: Harvel Quale, MD;  [facility];  Service: Gastroenterology;  Laterality: N/A;  1:35, pt knows to arrive at 9:45   EUS N/A 08/21/2015   Procedure: ESOPHAGEAL ENDOSCOPIC ULTRASOUND (EUS) RADIAL;  Surgeon: Carol Ada, MD;  Location: [facility];  Service: Endoscopy;  Laterality: N/A;   KNEE ARTHROSCOPY WITH MEDIAL MENISECTOMY Right 07/13/2017   Procedure: KNEE ARTHROSCOPY WITH PARTIAL MEDIAL MENISECTOMY;  Surgeon: Carole Civil, MD;  Location: [facility];  Service: Orthopedics;  Laterality: Right;   LEFT HEART CATHETERIZATION WITH CORONARY ANGIOGRAM N/A 09/23/2011   Procedure: LEFT HEART CATHETERIZATION WITH CORONARY ANGIOGRAM;  Surgeon: Thayer Headings, MD;  Location: [facility];  Service: Cardiovascular;  Laterality: N/A;   LIPOMA EXCISION Left 11/18/2013   Procedure: EXCISION OF SOFT TISSUE MASS-LEFT THIGH;  Surgeon: Jamesetta So, MD;  Location: [facility];  Service: General;  Laterality: Left;   NASAL SEPTOPLASTY W/ TURBINOPLASTY Bilateral 08/30/2021   Procedure: NASAL SEPTOPLASTY WITH BILATERAL TURBINATE REDUCTION;  Surgeon: Leta Baptist, MD;  Location: [facility];  Service: ENT;  Laterality: Bilateral;    POLYPECTOMY  04/16/2021   Procedure: POLYPECTOMY;  Surgeon: Harvel Quale, MD;  [facility];  Service: Gastroenterology;;  gastric   RECTOCELE REPAIR     x2   RECTOCELE REPAIR N/A 04/04/2017   Procedure: POSTERIOR REPAIR (RECTOCELE);  Surgeon: Jonnie Kind, MD;  Location: [facility];  Service: Gynecology;  Laterality: N/A;   TOTAL ABDOMINAL HYSTERECTOMY      Family History: Family History  Problem Relation Age of Onset   Cancer Mother        breast    Hypertension Mother    Hyperlipidemia Mother    Depression Mother    Anxiety disorder Mother    COPD Mother    Arthritis Mother        rheumatoid   Drug abuse Sister    Coronary artery disease Paternal Grandfather    Coronary artery disease Paternal Uncle    Depression Cousin    Drug abuse Cousin     Social History: Social History   Tobacco Use  Smoking Status Former   Packs/day: 1.00   Years: 19.00   Total pack years: 19.00   Types: Cigarettes   Quit date: 09/01/1997   Years since quitting: 24.6   Passive  exposure: Past  Smokeless Tobacco Never  Tobacco Comments   Quit smoking 1999 , previous 20 pack years   Social History   Substance and Sexual Activity  Alcohol Use Yes   Comment: 1 drink every other week   Social History   Substance and Sexual Activity  Drug Use No    Allergies: Allergies  Allergen Reactions   Morphine And Related Hives   Promethazine Hcl Other (See Comments)    Causes patient to become Hyper    Medications: Current Outpatient Medications  Medication Sig Dispense Refill   acetaminophen (TYLENOL) 500 MG tablet Take 500-1,000 mg by mouth every 6 (six) hours as needed for moderate pain.     ALPRAZolam (XANAX) 1 MG tablet Take 1 tablet (1 mg total) by mouth 3 (three) times daily. 90 tablet 2   atorvastatin (LIPITOR) 40 MG tablet TAKE ONE TABLET BY MOUTH ONCE DAILY. 90 tablet 0   Calcium 500-125 MG-UNIT TABS Take 1 tablet by mouth daily with supper.      Carboxymethylcellulose Sod PF 0.5 % SOLN Place 1 drop into both eyes daily as needed (dry eyes).     cyclobenzaprine (FLEXERIL) 5 MG tablet Take 1 tablet (5 mg total) by mouth 3 (three) times daily as needed. for muscle spams 30 tablet 11   diclofenac (CATAFLAM) 50 MG tablet TAKE (1) TABLET BY MOUTH TWICE DAILY. 90 tablet 0   diclofenac Sodium (VOLTAREN) 1 % GEL Apply 1 application topically daily as needed (pain).     estradiol (ESTRACE) 0.1 MG/GM vaginal cream Place 0.5 g vaginally 2 (two) times a week. Place 0.5g nightly for two weeks then twice a week after 30 g 11   ezetimibe (ZETIA) 10 MG tablet Take 1 tablet (10 mg total) by mouth daily. 90 tablet 3   famotidine (PEPCID) 20 MG tablet Take 20 mg by mouth 2 (two) times daily. As needed     fexofenadine (ALLEGRA) 180 MG tablet Take 180 mg by mouth daily.     HYDROcodone-acetaminophen (NORCO/VICODIN) 5-325 MG tablet Take 1 tablet by mouth every 4 (four) hours as needed. 20 tablet 0   lidocaine-prilocaine (EMLA) cream Apply 1 Application topically at bedtime.     methylphenidate (RITALIN) 20 MG tablet Take 1 tablet (20 mg total) by mouth 2 (two) times daily with breakfast and lunch. 60 tablet 0   montelukast (SINGULAIR) 10 MG tablet TAKE (1) TABLET BY MOUTH AT BEDTIME. 90 tablet 0   Multiple Vitamin (MULITIVITAMIN WITH MINERALS) TABS Take 1 tablet by mouth daily with breakfast.     Nystatin POWD by Does not apply route. As needed.     Olopatadine HCl 0.2 % SOLN Place 1 drop into both eyes in the morning and at bedtime.     Omega-3 Fatty Acids (FISH OIL) 1000 MG CAPS Take 2,000 mg by mouth.     omeprazole (PRILOSEC) 40 MG capsule Take 1 capsule (40 mg total) by mouth 2 (two) times daily. 60 capsule 5   oxybutynin (DITROPAN-XL) 10 MG 24 hr tablet Take 1 tablet (10 mg total) by mouth at bedtime. 90 tablet 3   Probiotic Product (TRUBIOTICS PO) Take 1 capsule by mouth daily. Takes 25 cfu daily.     pyridOXINE (VITAMIN B-6) 100 MG tablet Take 100 mg  by mouth daily.     traMADol (ULTRAM) 50 MG tablet Take 1 tablet (50 mg total) by mouth every 12 (twelve) hours as needed. 20 tablet 0   traZODone (DESYREL) 100 MG tablet Take  2 tablets (200 mg total) by mouth at bedtime. 60 tablet 2   venlafaxine XR (EFFEXOR XR) 150 MG 24 hr capsule Take 1 capsule (150 mg total) by mouth daily with breakfast. 90 capsule 2   lisinopril (ZESTRIL) 20 MG tablet TAKE 1 TABLET BY MOUTH DAILY. (Patient not taking: Reported on 04/28/2022) 90 tablet 0   No current facility-administered medications for this visit.    Review of Systems: GENERAL: negative for malaise, night sweats HEENT: No changes in hearing or vision, no nose bleeds or other nasal problems. NECK: Negative for lumps, goiter, pain and significant neck swelling RESPIRATORY: Negative for cough, wheezing CARDIOVASCULAR: Negative for chest pain, leg swelling, palpitations, orthopnea GI: SEE HPI MUSCULOSKELETAL: Negative for joint pain or swelling, back pain, and muscle pain. SKIN: Negative for lesions, rash PSYCH: Negative for sleep disturbance, mood disorder and recent psychosocial stressors. HEMATOLOGY Negative for prolonged bleeding, bruising easily, and swollen nodes. ENDOCRINE: Negative for cold or heat intolerance, polyuria, polydipsia and goiter. NEURO: negative for tremor, gait imbalance, syncope and seizures. The remainder of the review of systems is noncontributory.   Physical Exam: BP 124/75 (BP Location: Right Arm, Patient Position: Sitting, Cuff Size: Small)   Pulse 80   Temp 97.8 F (36.6 C) (Oral)   Ht 5' 4"  (1.626 m)   Wt 140 lb 8 oz (63.7 kg)   BMI 24.12 kg/m  GENERAL: The patient is AO x3, in no acute distress. HEENT: Head is normocephalic and atraumatic. EOMI are intact. Mouth is well hydrated and without lesions. NECK: Supple. No masses LUNGS: Clear to auscultation. No presence of rhonchi/wheezing/rales. Adequate chest expansion HEART: RRR, normal s1 and s2. ABDOMEN: Soft,  nontender, no guarding, no peritoneal signs, and nondistended. BS +. No masses. EXTREMITIES: Without any cyanosis, clubbing, rash, lesions or edema. NEUROLOGIC: AOx3, no focal motor deficit. SKIN: no jaundice, no rashes Patient is using a back-abdominal brace.  Imaging/Labs: as above  I personally reviewed and interpreted the available labs, imaging and endoscopic files.  Impression and Plan: HIKARI TRIPP is a 60 y.o. female with past medical history of bipolar disorder, cervical cancer, depression, hyperlipidemia, hypertension, IBS, stroke, SIMO, CKD stage III and NASH, who presents for follow up of SIMO.  The patient has presented significant improvement of her symptoms after receiving antibiotic course for SIMO.  Her abdominal distention has completely resolved and she does not have any abdominal pain.  Is still having some episode of constipation but she has been able to manage it with MiraLAX and Benefiber, which she increased as opiates slightly worsening her constipation.  Overall she has felt much better in terms of her abdominal complaints which is reassuring.  She has presented some episodes of burping and intermittent dysphagia.  Ideally we should evaluate this when she is able to be of opiates as this may affect the testing, including barium swallow.  She believes that she will not be able to tolerate an esophageal manometry unless she is sedated for this.  I am not sure if they will be able to give sedation at [facility] for this purpose.  -Patient to call back to the office once she has stopped the pain medications or if trouble swallowing worsens -Continue with MiraLAX and Benefiber at the current dosage  All questions were answered.      Maylon Peppers, MD Gastroenterology and Hepatology [facility]

## 2022-05-02 ENCOUNTER — Telehealth (HOSPITAL_COMMUNITY): Payer: Self-pay | Admitting: *Deleted

## 2022-05-02 NOTE — Telephone Encounter (Signed)
Opened in Error.

## 2022-05-03 ENCOUNTER — Telehealth: Payer: Self-pay | Admitting: Physical Therapy

## 2022-05-03 NOTE — Telephone Encounter (Signed)
Spoke to patient on the phone. She is in a brace to help her back that is healing from a spinal fracture. MD wants her to keep her brace on and only take off when she is sleeping. Patient and therapist decided she is to stop physical therapy and be discharged at this time so she can focus on her back healing. She is to get a new prescription to resume therapy in the future. Earlie Counts, PT @10 /31/2023@ 1:31 PM'

## 2022-05-03 NOTE — Telephone Encounter (Signed)
noted 

## 2022-05-04 ENCOUNTER — Ambulatory Visit: Payer: PPO | Admitting: Physical Therapy

## 2022-05-05 ENCOUNTER — Other Ambulatory Visit (HOSPITAL_COMMUNITY): Payer: Self-pay | Admitting: Psychiatry

## 2022-05-05 ENCOUNTER — Telehealth (HOSPITAL_COMMUNITY): Payer: Self-pay | Admitting: *Deleted

## 2022-05-05 MED ORDER — METHYLPHENIDATE HCL 20 MG PO TABS: 20.0000 mg | ORAL_TABLET | Freq: Two times a day (BID) | ORAL | 0 refills | Status: DC

## 2022-05-05 NOTE — Telephone Encounter (Signed)
Its been 2xs dailynfor about a year, I sent it in, she needs appt

## 2022-05-05 NOTE — Telephone Encounter (Signed)
Patient called stating she is needing refills for her Ritalin 71m BID.   Patient would like to know why provider dropped her down to 256mBID instead of 2051mID.

## 2022-05-06 ENCOUNTER — Encounter: Payer: Self-pay | Admitting: Orthopaedic Surgery

## 2022-05-09 ENCOUNTER — Encounter: Payer: Self-pay | Admitting: Internal Medicine

## 2022-05-09 ENCOUNTER — Telehealth: Payer: Self-pay | Admitting: Orthopaedic Surgery

## 2022-05-09 ENCOUNTER — Ambulatory Visit (INDEPENDENT_AMBULATORY_CARE_PROVIDER_SITE_OTHER): Payer: PPO | Admitting: Internal Medicine

## 2022-05-09 VITALS — BP 118/75 | HR 84 | Ht 64.0 in | Wt 139.2 lb

## 2022-05-09 DIAGNOSIS — E782 Mixed hyperlipidemia: Secondary | ICD-10-CM | POA: Diagnosis not present

## 2022-05-09 DIAGNOSIS — I10 Essential (primary) hypertension: Secondary | ICD-10-CM

## 2022-05-09 DIAGNOSIS — N1831 Chronic kidney disease, stage 3a: Secondary | ICD-10-CM

## 2022-05-09 DIAGNOSIS — S32020S Wedge compression fracture of second lumbar vertebra, sequela: Secondary | ICD-10-CM

## 2022-05-09 MED ORDER — ATORVASTATIN CALCIUM 40 MG PO TABS: 40.0000 mg | ORAL_TABLET | Freq: Every day | ORAL | 3 refills | Status: DC

## 2022-05-09 MED ORDER — EZETIMIBE 10 MG PO TABS: 10.0000 mg | ORAL_TABLET | Freq: Every day | ORAL | 3 refills | Status: DC

## 2022-05-09 NOTE — Assessment & Plan Note (Signed)
Last BMP showed GFR of 61, improved Needs to maintain adequate hydration Avoid nephrotoxic agents including NSAIDs, had told her to avoid Nabumetone in the last visit Takes diclofenac for lumbar back pain for now

## 2022-05-09 NOTE — Telephone Encounter (Signed)
Informed patient with what provider stated and she verbalized understanding. Appt was made

## 2022-05-09 NOTE — Patient Instructions (Signed)
Please continue to take medications as prescribed.  Please continue to maintain adequate hydration by taking at least 64 ounces of fluid in a day.

## 2022-05-09 NOTE — Assessment & Plan Note (Signed)
On Atorvastatin 40 mg QD due to h/o CVA and uncontrolled HLD

## 2022-05-09 NOTE — Telephone Encounter (Signed)
Please see below.

## 2022-05-09 NOTE — Assessment & Plan Note (Signed)
Had a mechanical fall at home, L2 compression fracture Has a lumbar back brace, followed by orthopedic surgery On tramadol as needed for severe pain

## 2022-05-09 NOTE — Assessment & Plan Note (Signed)
BP Readings from Last 1 Encounters:  05/09/22 118/75   Well-controlled with lisinopril 20 mg QD Counseled for compliance with the medications Advised DASH diet and moderate exercise/walking, at least 150 mins/week

## 2022-05-09 NOTE — Telephone Encounter (Signed)
Pt states Dr Lorin Mercy called her.and asking for a call back after time this afternoon around 4pm. Pt phone number is [phone]

## 2022-05-09 NOTE — Progress Notes (Signed)
Established Patient Office Visit  Subjective:  Patient ID: [name], female    DOB: 1961-07-01  Age: 61 y.o. MRN: [id number]  CC:  Chief Complaint  Patient presents with   Follow-up    Follow up no concerns     HPI [name] is a 61 y.o. female with past medical history of HTN, CKD, asthma due to seasonal allergies, h/o hemorrhagic stroke in 1999 (unclear etiology), HLD, anxiety with depression, urinary incontinence and GERD who presents for f/u of her chronic medical conditions.  HTN: Her BP has been elevated lately at home due to back pain from lumbar compression fracture.  She has started taking lisinopril 20 mg daily again.  Her BP was well controlled today.  She denies any headache, chest pain or palpitations.  Lumbar compression fracture: She had a fall at nighttime at her home while using restroom.  She sustained a L2 compression fracture, has a brace and is followed by neurosurgeon.  She has low back pain, for which she takes tramadol as needed.  Denies any pain upon deep breathing.  She has started moderate activity as tolerated.       Past Medical History:  Diagnosis Date   Allergy    grass, dust , mold   Anxiety    Arthritis    Asthma due to seasonal allergies 06/15/2020   Bipolar disorder (HCC)    Carpal tunnel syndrome    Bilateral   Chest pain 09/2011   Cardiac cath-normal coronaries   Constipation    Depression    Difficulty urinating 05/31/2013   Elevated LFTs 12/16/2013   GERD (gastroesophageal reflux disease)    History of kidney stones    Hyperlipemia    Hyperlipidemia    Hypertension    Mild; provoked by stress and anxiety   IBS (irritable bowel syndrome)    Intracerebral bleed (HCC)    No aneurysm; followed by Dr. Athena Masse replaced    "lense transplant" 2022; pt states lense don't dilate or constrict   Loss of weight 01/06/2015   Osteoporosis    Stage 3a chronic kidney disease (HCC) 03/16/2021   Stroke (HCC) 1999    hemorrhagic stroke; weakness of left side    Past Surgical History:  Procedure Laterality Date   BIOPSY  04/16/2021   Procedure: BIOPSY;  Surgeon: Marguerita Merles, Reuel Boom, MD;  Location: [facility];  Service: Gastroenterology;;  small, bowel, esophageal(proximal and distal);   BRAIN SURGERY  1999   to remove blood clot after stroke    CARDIAC CATHETERIZATION  2016   CERVICAL FUSION     CHOLECYSTECTOMY N/A 10/14/2014   Procedure: LAPAROSCOPIC CHOLECYSTECTOMY WITH INTRAOPERATIVE CHOLANGIOGRAM;  Surgeon: Avel Peace, MD;  Location: [facility] OR;  Service: General;  Laterality: N/A;   CHONDROPLASTY Right 07/13/2017   Procedure: CHONDROPLASTY of patella;  Surgeon: Vickki Hearing, MD;  Location: [facility];  Service: Orthopedics;  Laterality: Right;   ESOPHAGEAL DILATION N/A 04/16/2021   Procedure: ESOPHAGEAL DILATION;  Surgeon: Dolores Frame, MD;  Location: [facility];  Service: Gastroenterology;  Laterality: N/A;   ESOPHAGOGASTRODUODENOSCOPY (EGD) WITH PROPOFOL N/A 04/16/2021   Procedure: ESOPHAGOGASTRODUODENOSCOPY (EGD) WITH PROPOFOL;  Surgeon: Dolores Frame, MD;  Location: [facility];  Service: Gastroenterology;  Laterality: N/A;  1:35, pt knows to arrive at 9:45   EUS N/A 08/21/2015   Procedure: ESOPHAGEAL ENDOSCOPIC ULTRASOUND (EUS) RADIAL;  Surgeon: [name], MD;  Location: [facility];  Service: Endoscopy;  Laterality: N/A;  KNEE ARTHROSCOPY WITH MEDIAL MENISECTOMY Right 07/13/2017   Procedure: KNEE ARTHROSCOPY WITH PARTIAL MEDIAL MENISECTOMY;  Surgeon: Vickki Hearing, MD;  Location: [facility];  Service: Orthopedics;  Laterality: Right;   LEFT HEART CATHETERIZATION WITH CORONARY ANGIOGRAM N/A 09/23/2011   Procedure: LEFT HEART CATHETERIZATION WITH CORONARY ANGIOGRAM;  Surgeon: Vesta Mixer, MD;  Location: [facility];  Service: Cardiovascular;  Laterality: N/A;   LIPOMA EXCISION Left 11/18/2013   Procedure: EXCISION OF SOFT TISSUE MASS-LEFT  THIGH;  Surgeon: Dalia Heading, MD;  Location: [facility];  Service: General;  Laterality: Left;   NASAL SEPTOPLASTY W/ TURBINOPLASTY Bilateral 08/30/2021   Procedure: NASAL SEPTOPLASTY WITH BILATERAL TURBINATE REDUCTION;  Surgeon: Newman Pies, MD;  Location: [facility];  Service: ENT;  Laterality: Bilateral;   POLYPECTOMY  04/16/2021   Procedure: POLYPECTOMY;  Surgeon: Dolores Frame, MD;  Location: [facility];  Service: Gastroenterology;;  gastric   RECTOCELE REPAIR     x2   RECTOCELE REPAIR N/A 04/04/2017   Procedure: POSTERIOR REPAIR (RECTOCELE);  Surgeon: Tilda Burrow, MD;  Location: [facility];  Service: Gynecology;  Laterality: N/A;   TOTAL ABDOMINAL HYSTERECTOMY      Family History  Problem Relation Age of Onset   Cancer Mother        breast    Hypertension Mother    Hyperlipidemia Mother    Depression Mother    Anxiety disorder Mother    COPD Mother    Arthritis Mother        rheumatoid   Drug abuse Sister    Coronary artery disease Paternal Grandfather    Coronary artery disease Paternal Uncle    Depression Cousin    Drug abuse Cousin     Social History   Socioeconomic History   Marital status: Married    Spouse name: [name]   Number of children: 0   Years of education: HS   Highest education level: Not on file  Occupational History   Occupation: unemployed    Comment: pending disability  Tobacco Use   Smoking status: Former    Packs/day: 1.00    Years: 19.00    Total pack years: 19.00    Types: Cigarettes    Quit date: 09/01/1997    Years since quitting: 24.7    Passive exposure: Past   Smokeless tobacco: Never   Tobacco comments:    Quit smoking 1999 , previous 20 pack years  Vaping Use   Vaping Use: Never used  Substance and Sexual Activity   Alcohol use: Yes    Comment: 1 drink every other week   Drug use: No   Sexual activity: Not Currently    Partners: Male    Birth control/protection: Surgical    Comment: hyst   Other  Topics Concern   Not on file  Social History Narrative   Currently unable to work   Lives in [place]   Married   Patient drinks 1 cup of caffeine daily.   Patient is right handed.    [facility]ined the [employer] to get more exercise   Social Determinants of Health   Financial Resource Strain: Low Risk  (03/29/2022)   Overall Financial Resource Strain (CARDIA)    Difficulty of Paying Living Expenses: Not hard at all  Food Insecurity: No Food Insecurity (03/29/2022)   Hunger Vital Sign    Worried About Running Out of Food in the Last Year: Never true    Ran Out of Food in the Last  Year: Never true  Transportation Needs: No Transportation Needs (03/29/2022)   PRAPARE - Administrator, Civil Service (Medical): No    Lack of Transportation (Non-Medical): No  Physical Activity: Inactive (03/29/2022)   Exercise Vital Sign    Days of Exercise per Week: 0 days    Minutes of Exercise per Session: 0 min  Stress: Stress Concern Present (03/29/2022)   [employer] of Occupational Health - Occupational Stress Questionnaire    Feeling of Stress : To some extent  Social Connections: Moderately Isolated (03/29/2022)   Social Connection and Isolation Panel [NHANES]    Frequency of Communication with Friends and Family: Twice a week    Frequency of Social Gatherings with Friends and Family: Once a week    Attends Religious Services: Never    Database administrator or Organizations: No    Attends Banker Meetings: Never    Marital Status: Married  Catering manager Violence: Not At Risk (03/29/2022)   Humiliation, Afraid, Rape, and Kick questionnaire    Fear of Current or Ex-Partner: No    Emotionally Abused: No    Physically Abused: No    Sexually Abused: No    Outpatient Medications Prior to Visit  Medication Sig Dispense Refill   acetaminophen (TYLENOL) 500 MG tablet Take 500-1,000 mg by mouth every 6 (six) hours as needed for moderate pain.     ALPRAZolam (XANAX) 1 MG tablet  Take 1 tablet (1 mg total) by mouth 3 (three) times daily. 90 tablet 2   Calcium 500-125 MG-UNIT TABS Take 1 tablet by mouth daily with supper.     Carboxymethylcellulose Sod PF 0.5 % SOLN Place 1 drop into both eyes daily as needed (dry eyes).     cyclobenzaprine (FLEXERIL) 5 MG tablet Take 1 tablet (5 mg total) by mouth 3 (three) times daily as needed. for muscle spams 30 tablet 11   diclofenac (CATAFLAM) 50 MG tablet TAKE (1) TABLET BY MOUTH TWICE DAILY. 90 tablet 0   diclofenac Sodium (VOLTAREN) 1 % GEL Apply 1 application topically daily as needed (pain).     estradiol (ESTRACE) 0.1 MG/GM vaginal cream Place 0.5 g vaginally 2 (two) times a week. Place 0.5g nightly for two weeks then twice a week after 30 g 11   famotidine (PEPCID) 20 MG tablet Take 20 mg by mouth 2 (two) times daily. As needed     fexofenadine (ALLEGRA) 180 MG tablet Take 180 mg by mouth daily.     lidocaine-prilocaine (EMLA) cream Apply 1 Application topically at bedtime.     lisinopril (ZESTRIL) 20 MG tablet TAKE 1 TABLET BY MOUTH DAILY. 90 tablet 0   methylphenidate (RITALIN) 20 MG tablet Take 1 tablet (20 mg total) by mouth 2 (two) times daily with breakfast and lunch. 60 tablet 0   montelukast (SINGULAIR) 10 MG tablet TAKE (1) TABLET BY MOUTH AT BEDTIME. 90 tablet 0   Multiple Vitamin (MULITIVITAMIN WITH MINERALS) TABS Take 1 tablet by mouth daily with breakfast.     Nystatin POWD by Does not apply route. As needed.     Olopatadine HCl 0.2 % SOLN Place 1 drop into both eyes in the morning and at bedtime.     Omega-3 Fatty Acids (FISH OIL) 1000 MG CAPS Take 2,000 mg by mouth.     omeprazole (PRILOSEC) 40 MG capsule Take 1 capsule (40 mg total) by mouth 2 (two) times daily. 60 capsule 5   oxybutynin (DITROPAN-XL) 10 MG 24 hr tablet  Take 1 tablet (10 mg total) by mouth at bedtime. 90 tablet 3   Probiotic Product (TRUBIOTICS PO) Take 1 capsule by mouth daily. Takes 25 cfu daily.     pyridOXINE (VITAMIN B-6) 100 MG tablet  Take 100 mg by mouth daily.     traMADol (ULTRAM) 50 MG tablet Take 1 tablet (50 mg total) by mouth every 12 (twelve) hours as needed. 20 tablet 0   traZODone (DESYREL) 100 MG tablet Take 2 tablets (200 mg total) by mouth at bedtime. 60 tablet 2   venlafaxine XR (EFFEXOR XR) 150 MG 24 hr capsule Take 1 capsule (150 mg total) by mouth daily with breakfast. 90 capsule 2   atorvastatin (LIPITOR) 40 MG tablet TAKE ONE TABLET BY MOUTH ONCE DAILY. 90 tablet 0   ezetimibe (ZETIA) 10 MG tablet Take 1 tablet (10 mg total) by mouth daily. 90 tablet 3   HYDROcodone-acetaminophen (NORCO/VICODIN) 5-325 MG tablet Take 1 tablet by mouth every 4 (four) hours as needed. (Patient not taking: Reported on 05/09/2022) 20 tablet 0   No facility-administered medications prior to visit.    Allergies  Allergen Reactions   Morphine And Related Hives   Promethazine Hcl Other (See Comments)    Causes patient to become Hyper    ROS Review of Systems  Constitutional:  Negative for chills and fever.  HENT:  Positive for sore throat. Negative for congestion, sinus pressure and sinus pain.   Eyes:  Negative for pain and discharge.  Respiratory:  Negative for cough and shortness of breath.   Cardiovascular:  Negative for chest pain and palpitations.  Gastrointestinal:  Positive for constipation. Negative for abdominal pain, diarrhea, nausea and vomiting.  Endocrine: Negative for polydipsia and polyuria.  Genitourinary:  Negative for dysuria and hematuria.  Musculoskeletal:  Positive for arthralgias and back pain. Negative for neck stiffness.  Skin:  Negative for rash.  Allergic/Immunologic: Positive for environmental allergies.  Neurological:  Negative for dizziness and weakness.  Psychiatric/Behavioral:  Negative for agitation and behavioral problems.       Objective:    Physical Exam Vitals reviewed.  Constitutional:      General: Katrine L Emmick "[name]" is not in acute distress.    Appearance: Shardee L  Sonn "[name]" is not diaphoretic.  HENT:     Head: Normocephalic and atraumatic.     Nose: Nose normal. No congestion.     Mouth/Throat:     Mouth: Mucous membranes are moist.     Pharynx: No posterior oropharyngeal erythema.  Eyes:     General: No scleral icterus.    Extraocular Movements: Extraocular movements intact.  Cardiovascular:     Rate and Rhythm: Normal rate and regular rhythm.     Pulses: Normal pulses.     Heart sounds: Normal heart sounds. No murmur heard. Pulmonary:     Breath sounds: Normal breath sounds. No wheezing or rales.  Musculoskeletal:     Cervical back: Neck supple. No tenderness.     Comments: Lumbar brace in place  Skin:    General: Skin is warm.     Findings: No rash.  Neurological:     General: No focal deficit present.     Mental Status: Yunuen L Willinger "[name]" is alert and oriented to person, place, and time.     Comments: Facial droop, chronic  Psychiatric:        Mood and Affect: Mood normal.        Behavior: Behavior normal.     BP  118/75 (BP Location: Right Arm, Patient Position: Sitting, Cuff Size: Normal)   Pulse 84   Ht 5\' 4"  (1.626 m)   Wt 139 lb 3.2 oz (63.1 kg)   SpO2 92%   BMI 23.89 kg/m  Wt Readings from Last 3 Encounters:  05/09/22 139 lb 3.2 oz (63.1 kg)  04/28/22 140 lb 8 oz (63.7 kg)  04/28/22 135 lb (61.2 kg)    Lab Results  Component Value Date   TSH 2.430 05/12/2021   Lab Results  Component Value Date   WBC 6.1 11/01/2021   HGB 12.5 11/01/2021   HCT 36.5 11/01/2021   MCV 97 11/01/2021   PLT 349 11/01/2021   Lab Results  Component Value Date   NA 144 11/01/2021   K 5.0 11/01/2021   CO2 26 11/01/2021   GLUCOSE 101 (H) 11/01/2021   BUN 20 11/01/2021   CREATININE 1.10 (H) 12/17/2021   BILITOT 0.3 11/01/2021   ALKPHOS 103 11/01/2021   AST 18 11/01/2021   ALT 29 11/01/2021   PROT 7.3 11/01/2021   ALBUMIN 4.7 11/01/2021   CALCIUM 9.9 11/01/2021   ANIONGAP 8 08/25/2021   EGFR 61 11/01/2021   Lab  Results  Component Value Date   CHOL 189 05/12/2021   Lab Results  Component Value Date   HDL 61 05/12/2021   Lab Results  Component Value Date   LDLCALC 98 05/12/2021   Lab Results  Component Value Date   TRIG 177 (H) 05/12/2021   Lab Results  Component Value Date   CHOLHDL 3.1 05/12/2021   Lab Results  Component Value Date   HGBA1C 5.5 11/01/2021      Assessment & Plan:   Problem List Items Addressed This Visit       Cardiovascular and Mediastinum   Hypertension - Primary    BP Readings from Last 1 Encounters:  05/09/22 118/75  Well-controlled with lisinopril 20 mg QD Counseled for compliance with the medications Advised DASH diet and moderate exercise/walking, at least 150 mins/week      Relevant Medications   atorvastatin (LIPITOR) 40 MG tablet   ezetimibe (ZETIA) 10 MG tablet     Musculoskeletal and Integument   Lumbar compression fracture (HCC)    Had a mechanical fall at home, L2 compression fracture Has a lumbar back brace, followed by orthopedic surgery On tramadol as needed for severe pain        Genitourinary   Stage 3a chronic kidney disease (HCC)    Last BMP showed GFR of 61, improved Needs to maintain adequate hydration Avoid nephrotoxic agents including NSAIDs, had told her to avoid Nabumetone in the last visit Takes diclofenac for lumbar back pain for now        Other   Mixed hyperlipidemia    On Atorvastatin 40 mg QD due to h/o CVA and uncontrolled HLD      Relevant Medications   atorvastatin (LIPITOR) 40 MG tablet   ezetimibe (ZETIA) 10 MG tablet    Meds ordered this encounter  Medications   atorvastatin (LIPITOR) 40 MG tablet    Sig: Take 1 tablet (40 mg total) by mouth daily.    Dispense:  90 tablet    Refill:  3   ezetimibe (ZETIA) 10 MG tablet    Sig: Take 1 tablet (10 mg total) by mouth daily.    Dispense:  90 tablet    Refill:  3    Follow-up: Return in about 4 months (around 09/07/2022).    Zay Yeargan  Concha Se,  MD

## 2022-05-10 ENCOUNTER — Encounter (HOSPITAL_COMMUNITY): Payer: Self-pay | Admitting: Psychiatry

## 2022-05-10 ENCOUNTER — Telehealth (HOSPITAL_COMMUNITY): Payer: PPO | Admitting: Psychiatry

## 2022-05-10 ENCOUNTER — Encounter: Payer: Self-pay | Admitting: Internal Medicine

## 2022-05-10 ENCOUNTER — Encounter (HOSPITAL_COMMUNITY): Payer: Self-pay

## 2022-05-10 DIAGNOSIS — F988 Other specified behavioral and emotional disorders with onset usually occurring in childhood and adolescence: Secondary | ICD-10-CM

## 2022-05-10 DIAGNOSIS — F332 Major depressive disorder, recurrent severe without psychotic features: Secondary | ICD-10-CM

## 2022-05-10 MED ORDER — METHYLPHENIDATE HCL 20 MG PO TABS: 20.0000 mg | ORAL_TABLET | Freq: Two times a day (BID) | ORAL | 0 refills | Status: DC

## 2022-05-10 MED ORDER — TRAZODONE HCL 100 MG PO TABS: 200.0000 mg | ORAL_TABLET | Freq: Every day | ORAL | 2 refills | Status: DC

## 2022-05-10 MED ORDER — ALPRAZOLAM 1 MG PO TABS: 1.0000 mg | ORAL_TABLET | Freq: Three times a day (TID) | ORAL | 2 refills | Status: DC

## 2022-05-10 MED ORDER — VENLAFAXINE HCL ER 150 MG PO CP24: 150.0000 mg | ORAL_CAPSULE | Freq: Every day | ORAL | 2 refills | Status: DC

## 2022-05-10 NOTE — Progress Notes (Signed)
Virtual Visit via Telephone Note  I connected with [name] on 05/10/22 at  1:40 PM EST by telephone and verified that I am speaking with the correct person using two identifiers.  Location: Patient: home Provider: office   I discussed the limitations, risks, security and privacy concerns of performing an evaluation and management service by telephone and the availability of in person appointments. I also discussed with the patient that there may be a patient responsible charge related to this service. The patient expressed understanding and agreed to proceed.      I discussed the assessment and treatment plan with the patient. The patient was provided an opportunity to ask questions and all were answered. The patient agreed with the plan and demonstrated an understanding of the instructions.   The patient was advised to call back or seek an in-person evaluation if the symptoms worsen or if the condition fails to improve as anticipated.  I provided 15 minutes of non-face-to-face time during this encounter.   [name], MD  [facility] MD/PA/NP OP Progress Note  05/10/2022 1:44 PM [name]  MRN:  [id number]  Chief Complaint:  Chief Complaint  Patient presents with   Anxiety   Depression   ADD   Follow-up   HPI:  T his patient is a 61 year old married white female who lives with her husband in [place]. She has no children. 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   The patient returns for follow-up after 3 months.  She states about 3 weeks ago she fell and fractured her L2 vertebrae.  She is now wearing a back brace.  She is still in a fair amount of pain.  She is on tramadol which is only helping to some degree.  She is trying to stay as active as she can by walking but she is not allowed to do much else.  She and her husband have a beach trip planned and at first she was not going to go but now she decided she is  going to try to go and enjoy herself the best that she can.  She feels that her medicines for depression and anxiety are still very helpful and she denies thoughts of self-harm or suicide.  The methylphenidate continues to help her focus and short-term memory. Visit Diagnosis:    ICD-10-CM   1. Major depressive disorder, recurrent, severe without psychotic features ([place])  F33.2     2. ADD (attention deficit disorder) without hyperactivity  F98.8       Past Psychiatric History: Past outpatient treatment for depression  Past Medical History:  Past Medical History:  Diagnosis Date   Allergy    grass, dust , mold   Anxiety    Arthritis    Asthma due to seasonal allergies 06/15/2020   Bipolar disorder ([place])    Carpal tunnel syndrome    Bilateral   Chest pain 09/2011   Cardiac cath-normal coronaries   Constipation    Depression    Difficulty urinating 05/31/2013   Elevated LFTs 12/16/2013   GERD (gastroesophageal reflux disease)    History of kidney stones    Hyperlipemia    Hyperlipidemia    Hypertension    Mild; provoked by stress and anxiety   IBS (irritable bowel syndrome)    Intracerebral bleed ([place])    No aneurysm; followed by Dr. Carolan Shiver replaced    "lense transplant" 2022; pt states  lense don't dilate or constrict   Loss of weight 01/06/2015   Osteoporosis    Stage 3a chronic kidney disease ([place]) 03/16/2021   Stroke ([place]) 1999   hemorrhagic stroke; weakness of left side    Past Surgical History:  Procedure Laterality Date   BIOPSY  04/16/2021   Procedure: BIOPSY;  Surgeon: Montez Morita, Quillian Quince, MD;  [facility];  Service: Gastroenterology;;  small, bowel, esophageal(proximal and distal);   BRAIN SURGERY  1999   to remove blood clot after stroke    CARDIAC CATHETERIZATION  2016   CERVICAL FUSION     CHOLECYSTECTOMY N/A 10/14/2014   Procedure: LAPAROSCOPIC CHOLECYSTECTOMY WITH INTRAOPERATIVE CHOLANGIOGRAM;  Surgeon: Jackolyn Confer, MD;   Location: [facility];  Service: General;  Laterality: N/A;   CHONDROPLASTY Right 07/13/2017   Procedure: CHONDROPLASTY of patella;  Surgeon: Carole Civil, MD;  Location: [facility];  Service: Orthopedics;  Laterality: Right;   ESOPHAGEAL DILATION N/A 04/16/2021   Procedure: ESOPHAGEAL DILATION;  Surgeon: Harvel Quale, MD;  [facility];  Service: Gastroenterology;  Laterality: N/A;   ESOPHAGOGASTRODUODENOSCOPY (EGD) WITH PROPOFOL N/A 04/16/2021   Procedure: ESOPHAGOGASTRODUODENOSCOPY (EGD) WITH PROPOFOL;  Surgeon: Harvel Quale, MD;  [facility];  Service: Gastroenterology;  Laterality: N/A;  1:35, pt knows to arrive at 9:45   EUS N/A 08/21/2015   Procedure: ESOPHAGEAL ENDOSCOPIC ULTRASOUND (EUS) RADIAL;  Surgeon: Carol Ada, MD;  [facility];  Service: Endoscopy;  Laterality: N/A;   KNEE ARTHROSCOPY WITH MEDIAL MENISECTOMY Right 07/13/2017   Procedure: KNEE ARTHROSCOPY WITH PARTIAL MEDIAL MENISECTOMY;  Surgeon: Carole Civil, MD;  [facility]: [facility];  Service: Orthopedics;  Laterality: Right;   LEFT HEART CATHETERIZATION WITH CORONARY ANGIOGRAM N/A 09/23/2011   Procedure: LEFT HEART CATHETERIZATION WITH CORONARY ANGIOGRAM;  Surgeon: Thayer Headings, MD;  Location: [facility] C[facility];  Service: Cardiovascular;  Laterality: N/A;   LIPOMA EXCISION Left 11/18/2013   Procedure: EXCISION OF SOFT TISSUE MASS-LEFT THIGH;  Surgeon: Jamesetta So, MD;  Location: [facility];  Service: General;  Laterality: Left;   NASAL SEPTOPLASTY W/ TURBINOPLASTY Bilateral 08/30/2021   Procedure: NASAL SEPTOPLASTY WITH BILATERAL TURBINATE REDUCTION;  Surgeon: Leta Baptist, MD;  Location: [place] [place];  Service: ENT;  Laterality: Bilateral;   POLYPECTOMY  04/16/2021   Procedure: POLYPECTOMY;  Surgeon: Harvel Quale, MD;  [facility];  Service: Gastroenterology;;  gastric   RECTOCELE REPAIR     x2   RECTOCELE REPAIR N/A 04/04/2017    Procedure: POSTERIOR REPAIR (RECTOCELE);  Surgeon: Jonnie Kind, MD;  Location[facility];  Service: Gynecology;  Laterality: N/A;   TOTAL ABDOMINAL HYSTERECTOMY      Family Psychiatric History: See below  Family History:  Family History  Problem Relation Age of Onset   Cancer Mother        breast    Hypertension Mother    Hyperlipidemia Mother    Depression Mother    Anxiety disorder Mother    COPD Mother    Arthritis Mother        rheumatoid   Drug abuse Sister    Coronary artery disease Paternal Grandfather    Coronary artery disease Paternal Uncle    Depression Cousin    Drug abuse Cousin     Social History:  Social History   Socioeconomic History   Marital status: Married    Spouse name: Sonia Side   Number of children: 0   Years of education: HS  Highest education level: Not on file  Occupational History   Occupation: unemployed    Comment: pending disability  Tobacco Use   Smoking status: Former    Packs/day: 1.00    Years: 19.00    Total pack years: 19.00    Types: Cigarettes    Quit date: 09/01/1997    Years since quitting: 24.7    Passive exposure: Past   Smokeless tobacco: Never   Tobacco comments:    Quit smoking 1999 , previous 20 pack years  Vaping Use   Vaping Use: Never used  Substance and Sexual Activity   Alcohol use: Yes    Comment: 1 drink every other week   Drug use: No   Sexual activity: Not Currently    Partners: Male    Birth control/protection: Surgical    Comment: hyst   Other Topics Concern   Not on file  Social History Narrative   Currently unable to work   [facility] [place]   Married   Patient drinks 1 cup of caffeine daily.   Patient is right handed.    [facility]ined the [employer] to get more exercise   Social Determinants of Health   Financial Resource Strain: Low Risk  (03/29/2022)   Overall Financial Resource Strain (CARDIA)    Difficulty of Paying Living Expenses: Not hard at all  Food Insecurity: No Food Insecurity (03/29/2022)    Hunger Vital Sign    Worried About Running Out of Food in the Last Year: Never true    Ran Out of Food in the Last Year: Never true  Transportation Needs: No Transportation Needs (03/29/2022)   PRAPARE - Hydrologist (Medical): No    Lack of Transportation (Non-Medical): No  Physical Activity: Inactive (03/29/2022)   Exercise Vital Sign    Days of Exercise per Week: 0 days    Minutes of Exercise per Session: 0 min  Stress: Stress Concern Present (03/29/2022)   [place]    Feeling of Stress : To some extent  Social Connections: Moderately Isolated (03/29/2022)   Social Connection and Isolation Panel [NHANES]    Frequency of Communication with Friends and Family: Twice a week    Frequency of Social Gatherings with Friends and Family: Once a week    Attends Religious Services: Never    Marine scientist or Organizations: No    Attends Archivist Meetings: Never    Marital Status: Married    Allergies:  Allergies  Allergen Reactions   Morphine And Related Hives   Promethazine Hcl Other (See Comments)    Causes patient to become Hyper    Metabolic Disorder Labs: Lab Results  Component Value Date   HGBA1C 5.5 11/01/2021   MPG 117 (H) 01/06/2015   MPG 117 (H) 02/17/2014   No results found for: "PROLACTIN" Lab Results  Component Value Date   CHOL 189 05/12/2021   TRIG 177 (H) 05/12/2021   HDL 61 05/12/2021   CHOLHDL 3.1 05/12/2021   VLDL 26 09/16/2015   LDLCALC 98 05/12/2021   LDLCALC 105 (H) 08/31/2020   Lab Results  Component Value Date   TSH 2.430 05/12/2021   TSH 1.770 08/31/2020    Therapeutic Level Labs: No results found for: "LITHIUM" No results found for: "VALPROATE" No results found for: "CBMZ"  Current Medications: Current Outpatient Medications  Medication Sig Dispense Refill   methylphenidate (RITALIN) 20 MG tablet Take 1 tablet (20 mg  total) by mouth 2 (two) times daily with breakfast and lunch. 60 tablet 0   methylphenidate (RITALIN) 20 MG tablet Take 1 tablet (20 mg total) by mouth 2 (two) times daily with breakfast and lunch. 60 tablet 0   acetaminophen (TYLENOL) 500 MG tablet Take 500-1,000 mg by mouth every 6 (six) hours as needed for moderate pain.     ALPRAZolam (XANAX) 1 MG tablet Take 1 tablet (1 mg total) by mouth 3 (three) times daily. 90 tablet 2   atorvastatin (LIPITOR) 40 MG tablet Take 1 tablet (40 mg total) by mouth daily. 90 tablet 3   Calcium 500-125 MG-UNIT TABS Take 1 tablet by mouth daily with supper.     Carboxymethylcellulose Sod PF 0.5 % SOLN Place 1 drop into both eyes daily as needed (dry eyes).     cyclobenzaprine (FLEXERIL) 5 MG tablet Take 1 tablet (5 mg total) by mouth 3 (three) times daily as needed. for muscle spams 30 tablet 11   diclofenac (CATAFLAM) 50 MG tablet TAKE (1) TABLET BY MOUTH TWICE DAILY. 90 tablet 0   diclofenac Sodium (VOLTAREN) 1 % GEL Apply 1 application topically daily as needed (pain).     estradiol (ESTRACE) 0.1 MG/GM vaginal cream Place 0.5 g vaginally 2 (two) times a week. Place 0.5g nightly for two weeks then twice a week after 30 g 11   ezetimibe (ZETIA) 10 MG tablet Take 1 tablet (10 mg total) by mouth daily. 90 tablet 3   famotidine (PEPCID) 20 MG tablet Take 20 mg by mouth 2 (two) times daily. As needed     fexofenadine (ALLEGRA) 180 MG tablet Take 180 mg by mouth daily.     HYDROcodone-acetaminophen (NORCO/VICODIN) 5-325 MG tablet Take 1 tablet by mouth every 4 (four) hours as needed. (Patient not taking: Reported on 05/09/2022) 20 tablet 0   lidocaine-prilocaine (EMLA) cream Apply 1 Application topically at bedtime.     lisinopril (ZESTRIL) 20 MG tablet TAKE 1 TABLET BY MOUTH DAILY. 90 tablet 0   methylphenidate (RITALIN) 20 MG tablet Take 1 tablet (20 mg total) by mouth 2 (two) times daily with breakfast and lunch. 60 tablet 0   montelukast (SINGULAIR) 10 MG tablet  TAKE (1) TABLET BY MOUTH AT BEDTIME. 90 tablet 0   Multiple Vitamin (MULITIVITAMIN WITH MINERALS) TABS Take 1 tablet by mouth daily with breakfast.     Nystatin POWD by Does not apply route. As needed.     Olopatadine HCl 0.2 % SOLN Place 1 drop into both eyes in the morning and at bedtime.     Omega-3 Fatty Acids (FISH OIL) 1000 MG CAPS Take 2,000 mg by mouth.     omeprazole (PRILOSEC) 40 MG capsule Take 1 capsule (40 mg total) by mouth 2 (two) times daily. 60 capsule 5   oxybutynin (DITROPAN-XL) 10 MG 24 hr tablet Take 1 tablet (10 mg total) by mouth at bedtime. 90 tablet 3   Probiotic Product (TRUBIOTICS PO) Take 1 capsule by mouth daily. Takes 25 cfu daily.     pyridOXINE (VITAMIN B-6) 100 MG tablet Take 100 mg by mouth daily.     traMADol (ULTRAM) 50 MG tablet Take 1 tablet (50 mg total) by mouth every 12 (twelve) hours as needed. 20 tablet 0   traZODone (DESYREL) 100 MG tablet Take 2 tablets (200 mg total) by mouth at bedtime. 60 tablet 2   venlafaxine XR (EFFEXOR XR) 150 MG 24 hr capsule Take 1 capsule (150 mg total) by mouth daily with  breakfast. 90 capsule 2   No current facility-administered medications for this visit.     Musculoskeletal: Strength & Muscle Tone: na Gait & Station: na Patient leans: N/A  Psychiatric Specialty Exam: Review of Systems  Musculoskeletal:  Positive for back pain.  All other systems reviewed and are negative.   There were no vitals taken for this visit.There is no height or weight on file to calculate BMI.  General Appearance: NA  Eye Contact:  NA  Speech:  Clear and Coherent  Volume:  Normal  Mood:  Euthymic  Affect:  NA  Thought Process:  Goal Directed  Orientation:  Full (Time, Place, and Person)  Thought Content: WDL   Suicidal Thoughts:  No  Homicidal Thoughts:  No  Memory:  Immediate;   Good Recent;   Fair Remote;   NA  Judgement:  Good  Insight:  Good  Psychomotor Activity:  Decreased  Concentration:  Concentration: Good and  Attention Span: Good  Recall:  Good  Fund of Knowledge: Good  Language: Good  Akathisia:  No  Handed:  Right  AIMS (if indicated): not done  Assets:  Communication Skills Desire for Improvement Resilience Social Support Talents/Skills  ADL's:  Intact  Cognition: WNL  Sleep:  Fair   Screenings: AUDIT    Flowsheet Row Clinical Support from 01/06/2021 in [facility]  Alcohol Use Disorder Identification Test Final Score (AUDIT) 4      MDI    Flowsheet Row Office Visit from 01/22/2016 in [facility]  Total Score (max 50) 34      Mini-Mental    Flowsheet Row Office Visit from 02/13/2015 in [facility]  Total Score (max 30 points ) 26      PHQ2-9    [place] Office Visit from 05/09/2022 in [facility] from 03/29/2022 in [facility] Video Visit from 02/15/2022 in [facility] Video Visit from 11/22/2021 in [facility] from 11/11/2021 in [facility]  PHQ-2 Total Score 1 1 0 2 0  PHQ-9 Total Score -- -- -- 9 --      SBQ-R    [place] Office Visit from 01/22/2016 in [facility]  SBQ-R Total Score 14.1      Flowsheet Row ED from 04/15/2022 in [facility] Video Visit from 02/15/2022 in [facility] Video Visit from 11/22/2021 in [facility] No Risk No Risk Error: Q3, 4, or 5 should not be populated when Q2 is No        Assessment and Plan: This patient is a 61 year old female with a history of depression anxiety and problems with focus memory and energy.  She continues to do well on her current regimen.  She will continue methylphenidate 20 mg twice daily for ADD, Effexor  XR 150 mg daily for depression, trazodone 200 mg at bedtime for sleep and Xanax 1 mg 3 times daily for anxiety.  She will return to see me in 3 months  Collaboration of Care: Collaboration of Care: Primary Care Provider AEB notes are shared with PCP on the epic system  Patient/Guardian was advised Release of Information must be obtained prior to any record release in order to collaborate their care with an outside provider. Patient/Guardian was advised if they have not already done so to contact the registration department to sign all necessary  forms in order for [place] to release information regarding their care.   Consent: Patient/Guardian gives verbal consent for treatment and assignment of benefits for services provided during this visit. Patient/Guardian expressed understanding and agreed to proceed.    [name], MD 05/10/2022, 1:44 PM

## 2022-05-11 ENCOUNTER — Other Ambulatory Visit: Payer: Self-pay | Admitting: Orthopaedic Surgery

## 2022-05-11 ENCOUNTER — Telehealth (HOSPITAL_COMMUNITY): Payer: PPO | Admitting: Psychiatry

## 2022-05-11 MED ORDER — HYDROCODONE-ACETAMINOPHEN 5-325 MG PO TABS: 1.0000 | ORAL_TABLET | Freq: Two times a day (BID) | ORAL | 0 refills | Status: DC | PRN

## 2022-05-11 NOTE — Telephone Encounter (Signed)
I don't understand this. She is supposed to get 60 not 30. How many were missing? Please call pharmacy to clarify

## 2022-05-11 NOTE — Telephone Encounter (Signed)
noted 

## 2022-05-18 ENCOUNTER — Encounter: Payer: Self-pay | Admitting: Physical Therapy

## 2022-05-21 ENCOUNTER — Telehealth: Payer: Self-pay | Admitting: Orthopedic Surgery

## 2022-05-23 ENCOUNTER — Other Ambulatory Visit: Payer: Self-pay | Admitting: Orthopaedic Surgery

## 2022-05-23 MED ORDER — TRAMADOL HCL 50 MG PO TABS: 50.0000 mg | ORAL_TABLET | Freq: Two times a day (BID) | ORAL | 0 refills | Status: DC | PRN

## 2022-05-23 NOTE — Telephone Encounter (Signed)
noted 

## 2022-05-24 ENCOUNTER — Other Ambulatory Visit: Payer: Self-pay | Admitting: Orthopaedic Surgery

## 2022-05-24 ENCOUNTER — Other Ambulatory Visit (HOSPITAL_COMMUNITY): Payer: Self-pay | Admitting: Psychiatry

## 2022-05-24 ENCOUNTER — Encounter: Payer: Self-pay | Admitting: Orthopedic Surgery

## 2022-05-24 MED ORDER — TRAZODONE HCL 100 MG PO TABS: 200.0000 mg | ORAL_TABLET | Freq: Every day | ORAL | 2 refills | Status: DC

## 2022-05-25 ENCOUNTER — Encounter (INDEPENDENT_AMBULATORY_CARE_PROVIDER_SITE_OTHER): Payer: Self-pay | Admitting: Gastroenterology

## 2022-05-29 ENCOUNTER — Other Ambulatory Visit: Payer: Self-pay | Admitting: Obstetrics and Gynecology

## 2022-05-29 DIAGNOSIS — M62838 Other muscle spasm: Secondary | ICD-10-CM

## 2022-05-30 ENCOUNTER — Telehealth: Payer: Self-pay | Admitting: Orthopaedic Surgery

## 2022-05-30 NOTE — Telephone Encounter (Signed)
Please see below. Patient is requesting call from you. Bone density test has been ordered.

## 2022-05-30 NOTE — Telephone Encounter (Signed)
Pt called requesting a call back from Dr Lorin Mercy. Pt states she was to have a bone density test and that has been some confusion and pt states she need to speak to Dr Lorin Mercy. Please call pt at [phone].

## 2022-06-01 ENCOUNTER — Other Ambulatory Visit: Payer: Self-pay | Admitting: Obstetrics and Gynecology

## 2022-06-01 ENCOUNTER — Ambulatory Visit (HOSPITAL_COMMUNITY)
Admission: RE | Admit: 2022-06-01 | Discharge: 2022-06-01 | Disposition: A | Discharge status: Home / Self Care | Payer: PPO | Source: Ambulatory Visit | Attending: Orthopaedic Surgery | Admitting: Orthopaedic Surgery

## 2022-06-01 DIAGNOSIS — E559 Vitamin D deficiency, unspecified: Secondary | ICD-10-CM | POA: Diagnosis not present

## 2022-06-01 DIAGNOSIS — S32020A Wedge compression fracture of second lumbar vertebra, initial encounter for closed fracture: Secondary | ICD-10-CM | POA: Insufficient documentation

## 2022-06-01 DIAGNOSIS — S32010S Wedge compression fracture of first lumbar vertebra, sequela: Secondary | ICD-10-CM | POA: Insufficient documentation

## 2022-06-01 DIAGNOSIS — R5383 Other fatigue: Secondary | ICD-10-CM | POA: Diagnosis not present

## 2022-06-01 DIAGNOSIS — M62838 Other muscle spasm: Secondary | ICD-10-CM

## 2022-06-01 DIAGNOSIS — M810 Age-related osteoporosis without current pathological fracture: Secondary | ICD-10-CM | POA: Diagnosis not present

## 2022-06-02 ENCOUNTER — Encounter: Payer: Self-pay | Admitting: Internal Medicine

## 2022-06-02 ENCOUNTER — Other Ambulatory Visit: Payer: Self-pay | Admitting: Internal Medicine

## 2022-06-02 DIAGNOSIS — M62838 Other muscle spasm: Secondary | ICD-10-CM

## 2022-06-02 MED ORDER — CYCLOBENZAPRINE HCL 5 MG PO TABS: 5.0000 mg | ORAL_TABLET | Freq: Three times a day (TID) | ORAL | 5 refills | Status: DC | PRN

## 2022-06-09 ENCOUNTER — Ambulatory Visit (INDEPENDENT_AMBULATORY_CARE_PROVIDER_SITE_OTHER): Payer: PPO | Admitting: Orthopaedic Surgery

## 2022-06-09 ENCOUNTER — Ambulatory Visit: Payer: Self-pay

## 2022-06-09 ENCOUNTER — Encounter: Payer: Self-pay | Admitting: Orthopaedic Surgery

## 2022-06-09 VITALS — Ht 64.0 in | Wt 139.0 lb

## 2022-06-09 DIAGNOSIS — S32020A Wedge compression fracture of second lumbar vertebra, initial encounter for closed fracture: Secondary | ICD-10-CM

## 2022-06-09 NOTE — Progress Notes (Signed)
Office Visit Note   Patient: [name]           Date of Birth: 1960-09-12           MRN: [id number] Visit Date: 06/09/2022              Requested by: Lindell Spar, MD [address] [place],  [place] [address] PCP: Lindell Spar, MD   Assessment & Plan: Visit Diagnoses:  1. Compression fracture of L2 vertebra, initial encounter [facility])     Plan: Patient is doing better only has problems if she forgets and bends down and tries to pick something up.  Patient has osteoporosis by bone density test will follow-up with Dr. Layne Benton.  Follow-Up Instructions: No follow-ups on file.   Orders:  Orders Placed This Encounter  Procedures   XR Lumbar Spine 2-3 Views   No orders of the defined types were placed in this encounter.     Procedures: No procedures performed   Clinical Data: No additional findings.   Subjective: Chief Complaint  Patient presents with   Lower Back - Follow-up, Fracture    HPI follow-up L2 compression fracture with previous L1 fracture.  She is in her TLSO brace which she can continue until Christmas.  She had her lab work done and also bone density test and we will get her scheduled to see Dr. Layne Benton.  She can follow-up with me with the single lateral lumbar x-ray look at L2 fracture in February.  She has noticed improvement week to week.  She has been calling almost daily with questions but not in the last week.  Review of Systems all other systems updated unchanged.   Objective: Vital Signs: Ht 5' 4"  (1.626 m)   Wt 139 lb (63 kg)   BMI 23.86 kg/m   Physical Exam Constitutional:      Appearance: Jackeline L Hellberg "[name]" is well-developed.  HENT:     Head: Normocephalic.     Right Ear: External ear normal.     Left Ear: External ear normal. There is no impacted cerumen.  Eyes:     Pupils: Pupils are equal, round, and reactive to light.  Neck:     Thyroid: No thyromegaly.     Trachea: No tracheal deviation.  Cardiovascular:      Rate and Rhythm: Normal rate.  Pulmonary:     Effort: Pulmonary effort is normal.  Abdominal:     Palpations: Abdomen is soft.  Musculoskeletal:     Cervical back: No rigidity.  Skin:    General: Skin is warm and dry.  Neurological:     Mental Status: Kyla L Petterson "[name]" is alert and oriented to person, place, and time.  Psychiatric:        Behavior: Behavior normal.     Ortho Exam patient is ambulatory normal heel gait.  TLSO brace is fitted appropriately.  Lower extremity neurologic strength is good.  Specialty Comments:  No specialty comments available.  Imaging: EXAM: DUAL X-RAY ABSORPTIOMETRY (DXA) FOR BONE MINERAL DENSITY   IMPRESSION: Your patient Kanita Gadsden completed a BMD test on 06/01/2022 using the [employer] (software version: 14.10) manufactured by [employer]. The following summarizes the results of our evaluation. Technologist: [facility] PATIENT BIOGRAPHICAL: Name: Alysia, Scism Patient ID: [id number] Birth Date: January 31, 1961 Height: 64.0 in. Gender: Female Exam Date: 06/01/2022 Weight: 138.0 lbs. Indications: Caucasian, Follow up Osteoporosis, Height Loss, History of Fracture (Adult), Partial Hysterectomy, Post Menopausal, Renal  Fractures: L-Spine, Ankle, Spine, Toe Treatments: Calcium, Multivitamin, Vitamin D DENSITOMETRY RESULTS: Site         Region     Measured Date Measured Age WHO Classification Young Adult T-score BMD         %Change vs. Previous Significant Change (*) DualFemur Neck Left 06/01/2022 59.5 Osteoporosis -3.1 0.606 g/cm2 -12.7% Yes DualFemur Neck Left 01/07/2016 53.1 Osteoporosis -2.5 0.694 g/cm2 - -   DualFemur Total Mean 06/01/2022 59.5 Osteoporosis -2.6 0.683 g/cm2 -8.7% Yes DualFemur Total Mean 01/07/2016 53.1 Osteopenia -2.1 0.748 g/cm2 - -   Left Forearm Radius 33% 06/01/2022 59.5 Osteopenia -2.4 0.544 g/cm2 - - ASSESSMENT: The BMD measured at Femur Neck Left is 0.606 g/cm2 with a T-score  of -3.1. This patient is considered osteoporotic according to [facility]) criteria. The scan quality is good. Compared with the prior study on 01/07/16, the BMD of the total mean shows a statistically significant decrease. Lumbar spine was excluded due to advanced degenerative changes and 2 compression fractures. World Pharmacologist [facility]) criteria for post-menopausal, Caucasian Women: Normal:       T-score at or above -1 SD Osteopenia:   T-score between -1 and -2.5 SD Osteoporosis: T-score at or below -2.5 SD   RECOMMENDATIONS: 1. All patients should optimize calcium and vitamin D intake. 2. Consider FDA-approved medical therapies in postmenopausal women and med aged 61 years and older, based on the following: a. A hip or vertebral (clinical or morphometric) fracture b. T-score< -2.5 at the femoral neck or spine after appropriate evaluation to exclude secondary causes c. Low bone mass (T-score between -1.0 and -2.5 at the femoral neck or spine) and a 10-year probability of a hip fracture > 3% or a 10-year probability of a major osteoporosis-related fracture > 20% based on the [place]-adapted WHO algorithm d. Clinician judgment and/or patient preferences may indicate treatment for people with 10-year fracture probabilities above or below these levels FOLLOW-UP: Patients with diagnosis of osteoporsis or at high risk for fracture should have regular bone mineral density tests. For patients eligible for Medicare, routine testing is allowed once every 2 years. The testing frequency can be increased to one year for patients who have rapidly progressing disease, those who are receiving or discontinuing medical therapy to restore bone mass, or have additional risk factors. I have reviewed this report, and agree with the above findings.   [facility]     Electronically Signed   By: Zerita Boers M.D.   On: 06/01/2022 12:09   PMFS History: Patient  Active Problem List   Diagnosis Date Noted   Intestinal methanogen overgrowth 04/28/2022   History of total abdominal hysterectomy 03/28/2022   Dystrophia unguium 02/01/2022   Lumbar compression fracture ([place]) 12/23/2021   Lumbar spondylosis 11/04/2021   Encounter for general adult medical examination with abnormal findings 07/07/2021   Gastric polyp 05/31/2021   NASH (nonalcoholic steatohepatitis) 04/01/2021   Dysphagia 04/01/2021   Stage 3a chronic kidney disease ([place]) 03/16/2021   Breast pain 03/02/2021   Arthritis 01/31/2021   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   History of hemorrhagic stroke with residual hemiplegia ([place]) 06/15/2020   Urinary incontinence 06/15/2020   Asthma due to seasonal allergies 06/15/2020   Vaginal itching 03/16/2020   S/P right knee arthroscopy 07/13/17 07/20/2017   Derangement of posterior horn of medial meniscus of right knee    Chondromalacia, patella, right    IBS (irritable bowel syndrome) 05/11/2015   Gait instability 01/06/2015   DDD (degenerative disc disease),  lumbar 02/18/2014   Postmenopausal atrophic vaginitis 11/01/2013   Lipoma of abdominal wall 08/14/2013   Back pain 05/29/2013   Paresthesia of foot 02/12/2013   Hypertension    Depression 11/17/2011   Insomnia 11/17/2011   Mixed hyperlipidemia 09/20/2011   Anxiety 09/20/2011   Past Medical History:  Diagnosis Date   Allergy    grass, dust , mold   Anxiety    Arthritis    Asthma due to seasonal allergies 06/15/2020   Bipolar disorder ([place])    Carpal tunnel syndrome    Bilateral   Chest pain 09/2011   Cardiac cath-normal coronaries   Constipation    Depression    Difficulty urinating 05/31/2013   Elevated LFTs 12/16/2013   GERD (gastroesophageal reflux disease)    History of kidney stones    Hyperlipemia    Hyperlipidemia    Hypertension    Mild; provoked by stress and anxiety   IBS (irritable bowel syndrome)    Intracerebral bleed (HCC)    No aneurysm; followed by Dr.  Carolan Shiver replaced    "lense transplant" 2022; pt states lense don't dilate or constrict   Loss of weight 01/06/2015   Osteoporosis    Stage 3a chronic kidney disease ([place]) 03/16/2021   Stroke ([place]) 1999   hemorrhagic stroke; weakness of left side    Family History  Problem Relation Age of Onset   Cancer Mother        breast    Hypertension Mother    Hyperlipidemia Mother    Depression Mother    Anxiety disorder Mother    COPD Mother    Arthritis Mother        rheumatoid   Drug abuse Sister    Coronary artery disease Paternal Grandfather    Coronary artery disease Paternal Uncle    Depression Cousin    Drug abuse Cousin     Past Surgical History:  Procedure Laterality Date   BIOPSY  04/16/2021   Procedure: BIOPSY;  Surgeon: Harvel Quale, MD;  [facility];  Service: Gastroenterology;;  small, bowel, esophageal(proximal and distal);   BRAIN SURGERY  1999   to remove blood clot after stroke    CARDIAC CATHETERIZATION  2016   CERVICAL FUSION     CHOLECYSTECTOMY N/A 10/14/2014   Procedure: LAPAROSCOPIC CHOLECYSTECTOMY WITH INTRAOPERATIVE CHOLANGIOGRAM;  Surgeon: Jackolyn Confer, MD;  Location: [place];  Service: General;  Laterality: N/A;   CHONDROPLASTY Right 07/13/2017   Procedure: CHONDROPLASTY of patella;  Surgeon: Carole Civil, MD;  [facility];  Service: Orthopedics;  Laterality: Right;   ESOPHAGEAL DILATION N/A 04/16/2021   Procedure: ESOPHAGEAL DILATION;  Surgeon: Harvel Quale, MD;  [facility];  Service: Gastroenterology;  Laterality: N/A;   ESOPHAGOGASTRODUODENOSCOPY (EGD) WITH PROPOFOL N/A 04/16/2021   Procedure: ESOPHAGOGASTRODUODENOSCOPY (EGD) WITH PROPOFOL;  Surgeon: Harvel Quale, MD;  [facility];  Service: Gastroenterology;  Laterality: N/A;  1:35, pt knows to arrive at 9:45   EUS N/A 08/21/2015   Procedure: ESOPHAGEAL ENDOSCOPIC ULTRASOUND (EUS) RADIAL;  Surgeon: Carol Ada, MD;  [facility];  Service: Endoscopy;  Laterality: N/A;   KNEE ARTHROSCOPY WITH MEDIAL MENISECTOMY Right 07/13/2017   Procedure: KNEE ARTHROSCOPY WITH PARTIAL MEDIAL MENISECTOMY;  Surgeon: Carole Civil, MD;  [facility];  Service: Orthopedics;  Laterality: Right;   LEFT HEART CATHETERIZATION WITH CORONARY ANGIOGRAM N/A 09/23/2011   Procedure: LEFT HEART CATHETERIZATION WITH CORONARY ANGIOGRAM;  Surgeon: Wonda Cheng Nahser,  MD;  Location: [place] CATH [facility];  Service: Cardiovascular;  Laterality: N/A;   LIPOMA EXCISION Left 11/18/2013   Procedure: EXCISION OF SOFT TISSUE MASS-LEFT THIGH;  Surgeon: Jamesetta So, MD;  [facility];  Service: General;  Laterality: Left;   NASAL SEPTOPLASTY W/ TURBINOPLASTY Bilateral 08/30/2021   Procedure: NASAL SEPTOPLASTY WITH BILATERAL TURBINATE REDUCTION;  Surgeon: Leta Baptist, MD;  Location: [place];  Service: ENT;  Laterality: Bilateral;   POLYPECTOMY  04/16/2021   Procedure: POLYPECTOMY;  Surgeon: Harvel Quale, MD;  [facility];  Service: Gastroenterology;;  gastric   RECTOCELE REPAIR     x2   RECTOCELE REPAIR N/A 04/04/2017   Procedure: POSTERIOR REPAIR (RECTOCELE);  Surgeon: Jonnie Kind, MD;  [facility];  Service: Gynecology;  Laterality: N/A;   TOTAL ABDOMINAL HYSTERECTOMY     Social History   Occupational History   Occupation: unemployed    Comment: pending disability  Tobacco Use   Smoking status: Former    Packs/day: 1.00    Years: 19.00    Total pack years: 19.00    Types: Cigarettes    Quit date: 09/01/1997    Years since quitting: 24.7    Passive exposure: Past   Smokeless tobacco: Never   Tobacco comments:    Quit smoking 1999 , previous 20 pack years  Vaping Use   Vaping Use: Never used  Substance and Sexual Activity   Alcohol use: Yes    Comment: 1 drink every other week   Drug use: No   Sexual activity: Not Currently    Partners: Male    Birth  control/protection: Surgical    Comment: hyst

## 2022-06-10 ENCOUNTER — Encounter: Payer: Self-pay | Admitting: Orthopaedic Surgery

## 2022-06-13 ENCOUNTER — Other Ambulatory Visit: Payer: Self-pay | Admitting: Orthopaedic Surgery

## 2022-06-13 ENCOUNTER — Other Ambulatory Visit (HOSPITAL_COMMUNITY): Payer: Self-pay | Admitting: Psychiatry

## 2022-06-13 MED ORDER — TRAMADOL HCL 50 MG PO TABS: 50.0000 mg | ORAL_TABLET | Freq: Two times a day (BID) | ORAL | 0 refills | Status: DC | PRN

## 2022-06-20 DIAGNOSIS — K2289 Other specified disease of esophagus: Secondary | ICD-10-CM | POA: Diagnosis not present

## 2022-06-20 DIAGNOSIS — R1310 Dysphagia, unspecified: Secondary | ICD-10-CM | POA: Diagnosis not present

## 2022-06-22 ENCOUNTER — Telehealth (INDEPENDENT_AMBULATORY_CARE_PROVIDER_SITE_OTHER): Payer: Self-pay | Admitting: Gastroenterology

## 2022-06-22 ENCOUNTER — Other Ambulatory Visit (HOSPITAL_COMMUNITY): Payer: Self-pay | Admitting: Psychiatry

## 2022-06-22 ENCOUNTER — Encounter (INDEPENDENT_AMBULATORY_CARE_PROVIDER_SITE_OTHER): Payer: Self-pay | Admitting: Gastroenterology

## 2022-06-22 NOTE — Telephone Encounter (Signed)
I called the patient to discuss findings of the most recent esophageal manometry.  This was performed on 06/20/2022.  There was presence of increased IRP of 18.7 with pan esophageal pressurization.  Notably, this was present with several swallows.  There was an abnormal deglutive inhibition.  This was concerning for possible EGJOO.  However, the patient was on opiates while this was performed and this could have caused these abnormalities and her dysphagia. Unfortunately, I although patient several times but she did not pick up the phone.  I left a detailed voice message with the findings.  Daily she should taper off her opiates and if persistent dysphagia she should have a repeat esophageal manometry performed off opiates.

## 2022-06-23 NOTE — Telephone Encounter (Signed)
Pt called in stating that she needed a refill on her Xanax sent to [facility]. Rx was sent 05/10/22 with 2 refills to [facility] [address]. Asked pt did she need [place] to send it to [place], pt states that she will pick up rx from [facility].

## 2022-06-28 ENCOUNTER — Encounter: Payer: Self-pay | Admitting: Orthopaedic Surgery

## 2022-06-29 ENCOUNTER — Other Ambulatory Visit: Payer: Self-pay | Admitting: Physician Assistant

## 2022-06-29 ENCOUNTER — Encounter: Payer: Self-pay | Admitting: Internal Medicine

## 2022-06-29 MED ORDER — TRAMADOL HCL 50 MG PO TABS: 50.0000 mg | ORAL_TABLET | Freq: Three times a day (TID) | ORAL | 0 refills | Status: DC | PRN

## 2022-06-29 NOTE — Telephone Encounter (Signed)
sent 

## 2022-06-30 ENCOUNTER — Other Ambulatory Visit: Payer: Self-pay | Admitting: Physician Assistant

## 2022-06-30 MED ORDER — TRAMADOL HCL 50 MG PO TABS: 50.0000 mg | ORAL_TABLET | Freq: Three times a day (TID) | ORAL | 0 refills | Status: DC | PRN

## 2022-06-30 NOTE — Telephone Encounter (Signed)
I resent

## 2022-07-02 ENCOUNTER — Encounter: Payer: Self-pay | Admitting: Physical Therapy

## 2022-07-02 ENCOUNTER — Encounter: Payer: Self-pay | Admitting: Orthopaedic Surgery

## 2022-07-06 ENCOUNTER — Encounter (INDEPENDENT_AMBULATORY_CARE_PROVIDER_SITE_OTHER): Payer: Self-pay | Admitting: Gastroenterology

## 2022-07-08 ENCOUNTER — Encounter: Payer: Self-pay | Admitting: Internal Medicine

## 2022-07-11 DIAGNOSIS — S22010D Wedge compression fracture of first thoracic vertebra, subsequent encounter for fracture with routine healing: Secondary | ICD-10-CM | POA: Diagnosis not present

## 2022-07-11 DIAGNOSIS — M810 Age-related osteoporosis without current pathological fracture: Secondary | ICD-10-CM | POA: Diagnosis not present

## 2022-07-12 NOTE — Telephone Encounter (Signed)
noted 

## 2022-07-19 NOTE — Telephone Encounter (Signed)
Spoke to patient she declined adding on any medication right now, will wait and discuss with Dr Layne Benton

## 2022-07-20 ENCOUNTER — Encounter: Payer: Self-pay | Admitting: Internal Medicine

## 2022-07-21 ENCOUNTER — Encounter: Payer: Self-pay | Admitting: Internal Medicine

## 2022-07-24 ENCOUNTER — Encounter: Payer: Self-pay | Admitting: Internal Medicine

## 2022-07-25 ENCOUNTER — Encounter: Payer: Self-pay | Admitting: Orthopaedic Surgery

## 2022-07-26 NOTE — Telephone Encounter (Signed)
Sent to patient.

## 2022-07-27 ENCOUNTER — Encounter: Payer: Self-pay | Admitting: Internal Medicine

## 2022-07-29 ENCOUNTER — Encounter: Payer: Self-pay | Admitting: Internal Medicine

## 2022-07-29 NOTE — Telephone Encounter (Signed)
Please advise 

## 2022-07-31 ENCOUNTER — Other Ambulatory Visit: Payer: Self-pay | Admitting: Obstetrics and Gynecology

## 2022-07-31 DIAGNOSIS — N3941 Urge incontinence: Secondary | ICD-10-CM

## 2022-08-01 ENCOUNTER — Encounter (INDEPENDENT_AMBULATORY_CARE_PROVIDER_SITE_OTHER): Payer: Self-pay | Admitting: Gastroenterology

## 2022-08-02 ENCOUNTER — Other Ambulatory Visit (HOSPITAL_COMMUNITY): Payer: Self-pay | Admitting: Psychiatry

## 2022-08-04 ENCOUNTER — Encounter: Payer: Self-pay | Admitting: Internal Medicine

## 2022-08-08 ENCOUNTER — Encounter: Payer: Self-pay | Admitting: Orthopaedic Surgery

## 2022-08-09 ENCOUNTER — Ambulatory Visit: Payer: PPO | Admitting: Internal Medicine

## 2022-08-09 ENCOUNTER — Encounter (INDEPENDENT_AMBULATORY_CARE_PROVIDER_SITE_OTHER): Payer: Self-pay | Admitting: Gastroenterology

## 2022-08-09 NOTE — Telephone Encounter (Signed)
Patient bentyl was taken off med list at 04/28/22 office visit

## 2022-08-11 ENCOUNTER — Ambulatory Visit (INDEPENDENT_AMBULATORY_CARE_PROVIDER_SITE_OTHER): Payer: PPO

## 2022-08-11 ENCOUNTER — Encounter: Payer: Self-pay | Admitting: Orthopaedic Surgery

## 2022-08-11 ENCOUNTER — Ambulatory Visit (INDEPENDENT_AMBULATORY_CARE_PROVIDER_SITE_OTHER): Payer: PPO | Admitting: Orthopaedic Surgery

## 2022-08-11 VITALS — Ht 64.0 in | Wt 139.0 lb

## 2022-08-11 DIAGNOSIS — S32010S Wedge compression fracture of first lumbar vertebra, sequela: Secondary | ICD-10-CM

## 2022-08-11 DIAGNOSIS — M549 Dorsalgia, unspecified: Secondary | ICD-10-CM

## 2022-08-11 DIAGNOSIS — S32020A Wedge compression fracture of second lumbar vertebra, initial encounter for closed fracture: Secondary | ICD-10-CM | POA: Diagnosis not present

## 2022-08-11 NOTE — Progress Notes (Addendum)
Office Visit Note   Patient: [name]           Date of Birth: 1961-01-30           MRN: [id number] Visit Date: 08/11/2022              Requested by: Lindell Spar, MD [address] [place],  [place] [address] PCP: Lindell Spar, MD   Assessment & Plan: Visit Diagnoses:  1. Compression fracture of L2 vertebra, initial encounter ([place])   2. Compression fracture of L1 vertebra, sequela   3. Mid back pain     Plan: L2 compression fracture healed.  She should avoid military presses as well as clean and jerk exercises.  She can do light hand weights gradually work on the left upper extremity strengthening.  Follow-Up Instructions: Return if symptoms worsen or fail to improve.   Orders:  Orders Placed This Encounter  Procedures   XR Lumbar Spine 2-3 Views   XR Thoracic Spine 2 View   No orders of the defined types were placed in this encounter.     Procedures: No procedures performed   Clinical Data: No additional findings.   Subjective: Chief Complaint  Patient presents with   Middle Back - Pain   Lower Back - Pain, Fracture, Follow-up    HPI patient returns she states she has felt sharp pop in between her shoulder blades that happened 2 weeks ago when she was stretching.  She is walking at home she is taking Tylenol for pain.  She is going back to the gym and is asking about weight limit restrictions.  She is trying to get approval from the drug manufacture for discount for the osteoporosis medicine which she thinks is $2000 a month which is cost prohibitive for her.  Dr. Layne Benton is managing her osteoporosis.  Review of Systems previous CVA with left-sided weakness.   Objective: Vital Signs: Ht '5\' 4"'$  (1.626 m)   Wt 139 lb (63 kg)   BMI 23.86 kg/m   Physical Exam Constitutional:      Appearance: Diamond L Stockinger "[name]" is well-developed.  HENT:     Head: Normocephalic.     Right Ear: External ear normal.     Left Ear: External ear normal. There  is no impacted cerumen.  Eyes:     Pupils: Pupils are equal, round, and reactive to light.  Neck:     Thyroid: No thyromegaly.     Trachea: No tracheal deviation.  Cardiovascular:     Rate and Rhythm: Normal rate.  Pulmonary:     Effort: Pulmonary effort is normal.  Abdominal:     Palpations: Abdomen is soft.  Musculoskeletal:     Cervical back: No rigidity.  Skin:    General: Skin is warm and dry.  Neurological:     Mental Status: Diamond L Brubacher "[name]" is alert and oriented to person, place, and time.  Psychiatric:        Behavior: Behavior normal.     Ortho Exam patient ambulates without walking aid.  Upper extremity global weakness.  Specialty Comments:  No specialty comments available.  Imaging: XR Lumbar Spine 2-3 Views  Result Date: 08/11/2022 [facility] lateral lumbar images obtained and reviewed this shows some abdominal aortic calcification.  L2 compression fracture 30% without progression.  Callus formation is noted. Impression: Chronic L2 compression with comparison images October 2023 stable.  XR Thoracic Spine 2 View  Result Date: 08/11/2022 AP lateral thoracic spine was  obtained and reviewed some calcification of the aortic knob noted.  No thoracic compression fractures.  Previously noted L2 compression fracture stable.  Mild endplate spurring mid thoracic region without kyphosis. Impression: Thoracic spine images negative for thoracic compression fracture.    PMFS History: Patient Active Problem List   Diagnosis Date Noted   Intestinal methanogen overgrowth 04/28/2022   History of total abdominal hysterectomy 03/28/2022   Dystrophia unguium 02/01/2022   Lumbar compression fracture ([place]) 12/23/2021   Lumbar spondylosis 11/04/2021   Encounter for general adult medical examination with abnormal findings 07/07/2021   Gastric polyp 05/31/2021   NASH (nonalcoholic steatohepatitis) 04/01/2021   Dysphagia 04/01/2021   Stage 3a chronic kidney disease ([place]) 03/16/2021    Breast pain 03/02/2021   Arthritis 01/31/2021   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   History of hemorrhagic stroke with residual hemiplegia ([place]) 06/15/2020   Urinary incontinence 06/15/2020   Asthma due to seasonal allergies 06/15/2020   Vaginal itching 03/16/2020   S/P right knee arthroscopy 07/13/17 07/20/2017   Derangement of posterior horn of medial meniscus of right knee    Chondromalacia, patella, right    IBS (irritable bowel syndrome) 05/11/2015   Gait instability 01/06/2015   DDD (degenerative disc disease), lumbar 02/18/2014   Postmenopausal atrophic vaginitis 11/01/2013   Lipoma of abdominal wall 08/14/2013   Back pain 05/29/2013   Paresthesia of foot 02/12/2013   Hypertension    Depression 11/17/2011   Insomnia 11/17/2011   Mixed hyperlipidemia 09/20/2011   Anxiety 09/20/2011   Past Medical History:  Diagnosis Date   Allergy    grass, dust , mold   Anxiety    Arthritis    Asthma due to seasonal allergies 06/15/2020   Bipolar disorder ([place])    Carpal tunnel syndrome    Bilateral   Chest pain 09/2011   Cardiac cath-normal coronaries   Constipation    Depression    Difficulty urinating 05/31/2013   Elevated LFTs 12/16/2013   GERD (gastroesophageal reflux disease)    History of kidney stones    Hyperlipemia    Hyperlipidemia    Hypertension    Mild; provoked by stress and anxiety   IBS (irritable bowel syndrome)    Intracerebral bleed (HCC)    No aneurysm; followed by Dr. Carolan Shiver replaced    "lense transplant" 2022; pt states lense don't dilate or constrict   Loss of weight 01/06/2015   Osteoporosis    Stage 3a chronic kidney disease ([place]) 03/16/2021   Stroke ([place]) 1999   hemorrhagic stroke; weakness of left side    Family History  Problem Relation Age of Onset   Cancer Mother        breast    Hypertension Mother    Hyperlipidemia Mother    Depression Mother    Anxiety disorder Mother    COPD Mother    Arthritis Mother        rheumatoid    Drug abuse Sister    Coronary artery disease Paternal Grandfather    Coronary artery disease Paternal Uncle    Depression Cousin    Drug abuse Cousin     Past Surgical History:  Procedure Laterality Date   BIOPSY  04/16/2021   Procedure: BIOPSY;  Surgeon: Montez Morita, Quillian Quince, MD;  [facility];  Service: Gastroenterology;;  small, bowel, esophageal(proximal and distal);   BRAIN SURGERY  1999   to remove blood clot after stroke    CARDIAC CATHETERIZATION  2016   CERVICAL FUSION  CHOLECYSTECTOMY N/A 10/14/2014   Procedure: LAPAROSCOPIC CHOLECYSTECTOMY WITH INTRAOPERATIVE CHOLANGIOGRAM;  Surgeon: Jackolyn Confer, MD;  Location: [place];  Service: General;  Laterality: N/A;   CHONDROPLASTY Right 07/13/2017   Procedure: CHONDROPLASTY of patella;  Surgeon: Carole Civil, MD;  [facility];  Service: Orthopedics;  Laterality: Right;   ESOPHAGEAL DILATION N/A 04/16/2021   Procedure: ESOPHAGEAL DILATION;  Surgeon: Harvel Quale, MD;  [facility];  Service: Gastroenterology;  Laterality: N/A;   ESOPHAGOGASTRODUODENOSCOPY (EGD) WITH PROPOFOL N/A 04/16/2021   Procedure: ESOPHAGOGASTRODUODENOSCOPY (EGD) WITH PROPOFOL;  Surgeon: Harvel Quale, MD;  [facility];  Service: Gastroenterology;  Laterality: N/A;  1:35, pt knows to arrive at 9:45   EUS N/A 08/21/2015   Procedure: ESOPHAGEAL ENDOSCOPIC ULTRASOUND (EUS) RADIAL;  Surgeon: Carol Ada, MD;  [facility];  Service: Endoscopy;  Laterality: N/A;   KNEE ARTHROSCOPY WITH MEDIAL MENISECTOMY Right 07/13/2017   Procedure: KNEE ARTHROSCOPY WITH PARTIAL MEDIAL MENISECTOMY;  Surgeon: Carole Civil, MD;  [facility];  Service: Orthopedics;  Laterality: Right;   LEFT HEART CATHETERIZATION WITH CORONARY ANGIOGRAM N/A 09/23/2011   Procedure: LEFT HEART CATHETERIZATION WITH CORONARY ANGIOGRAM;  Surgeon: Thayer Headings, MD;  Location: [facility];  Service:  Cardiovascular;  Laterality: N/A;   LIPOMA EXCISION Left 11/18/2013   Procedure: EXCISION OF SOFT TISSUE MASS-LEFT THIGH;  Surgeon: Jamesetta So, MD;  [facility];  Service: General;  Laterality: Left;   NASAL SEPTOPLASTY W/ TURBINOPLASTY Bilateral 08/30/2021   Procedure: NASAL SEPTOPLASTY WITH BILATERAL TURBINATE REDUCTION;  Surgeon: Leta Baptist, MD;  Location: [place];  Service: ENT;  Laterality: Bilateral;   POLYPECTOMY  04/16/2021   Procedure: POLYPECTOMY;  Surgeon: Harvel Quale, MD;  [facility];  Service: Gastroenterology;;  gastric   RECTOCELE REPAIR     x2   RECTOCELE REPAIR N/A 04/04/2017   Procedure: POSTERIOR REPAIR (RECTOCELE);  Surgeon: Jonnie Kind, MD;  [facility];  Service: Gynecology;  Laterality: N/A;   TOTAL ABDOMINAL HYSTERECTOMY     Social History   Occupational History   Occupation: unemployed    Comment: pending disability  Tobacco Use   Smoking status: Former    Packs/day: 1.00    Years: 19.00    Total pack years: 19.00    Types: Cigarettes    Quit date: 09/01/1997    Years since quitting: 24.9    Passive exposure: Past   Smokeless tobacco: Never   Tobacco comments:    Quit smoking 1999 , previous 20 pack years  Vaping Use   Vaping Use: Never used  Substance and Sexual Activity   Alcohol use: Yes    Comment: 1 drink every other week   Drug use: No   Sexual activity: Not Currently    Partners: Male    Birth control/protection: Surgical    Comment: hyst

## 2022-08-12 ENCOUNTER — Encounter: Payer: Self-pay | Admitting: Internal Medicine

## 2022-08-13 ENCOUNTER — Encounter (HOSPITAL_COMMUNITY): Payer: Self-pay

## 2022-08-15 ENCOUNTER — Encounter: Payer: Self-pay | Admitting: Internal Medicine

## 2022-08-15 ENCOUNTER — Encounter: Payer: Self-pay | Admitting: Orthopaedic Surgery

## 2022-08-15 ENCOUNTER — Encounter: Payer: Self-pay | Admitting: Orthopedic Surgery

## 2022-08-16 ENCOUNTER — Encounter (INDEPENDENT_AMBULATORY_CARE_PROVIDER_SITE_OTHER): Payer: Self-pay | Admitting: Gastroenterology

## 2022-08-17 ENCOUNTER — Encounter: Payer: Self-pay | Admitting: Internal Medicine

## 2022-08-18 ENCOUNTER — Encounter: Payer: Self-pay | Admitting: Orthopedic Surgery

## 2022-08-18 ENCOUNTER — Other Ambulatory Visit: Payer: Self-pay | Admitting: Internal Medicine

## 2022-08-18 ENCOUNTER — Ambulatory Visit (INDEPENDENT_AMBULATORY_CARE_PROVIDER_SITE_OTHER): Payer: PPO | Admitting: Orthopedic Surgery

## 2022-08-18 DIAGNOSIS — R7303 Prediabetes: Secondary | ICD-10-CM

## 2022-08-18 DIAGNOSIS — N1831 Chronic kidney disease, stage 3a: Secondary | ICD-10-CM

## 2022-08-18 DIAGNOSIS — F419 Anxiety disorder, unspecified: Secondary | ICD-10-CM

## 2022-08-18 DIAGNOSIS — M25562 Pain in left knee: Secondary | ICD-10-CM

## 2022-08-18 DIAGNOSIS — M25561 Pain in right knee: Secondary | ICD-10-CM

## 2022-08-18 DIAGNOSIS — E782 Mixed hyperlipidemia: Secondary | ICD-10-CM

## 2022-08-18 DIAGNOSIS — I10 Essential (primary) hypertension: Secondary | ICD-10-CM

## 2022-08-18 DIAGNOSIS — G8929 Other chronic pain: Secondary | ICD-10-CM | POA: Diagnosis not present

## 2022-08-18 DIAGNOSIS — K7581 Nonalcoholic steatohepatitis (NASH): Secondary | ICD-10-CM

## 2022-08-18 MED ORDER — METHYLPREDNISOLONE ACETATE 40 MG/ML IJ SUSP
40.0000 mg | Freq: Once | INTRAMUSCULAR | Status: AC
  Administered 2022-08-18: 40 mg via INTRA_ARTICULAR

## 2022-08-18 NOTE — Addendum Note (Signed)
Addended by: Carole Civil on: 08/18/2022 03:37 PM   Modules accepted: Level of Service

## 2022-08-18 NOTE — Patient Instructions (Signed)
You have received an injection of steroids into the joint. 15% of patients will have increased pain within the 24 hours postinjection.   This is transient and will go away.   We recommend that you use ice packs on the injection site for 20 minutes every 2 hours and extra strength Tylenol 2 tablets every 8 as needed until the pain resolves.  If you continue to have pain after taking the Tylenol and using the ice please call the office for further instructions.  THERE ARE SEVERAL TOPICAL MEDICATIONS YOU CAN USE  ASPERCREME BEN GAY VOLTAREN GEL  CAPZACIN

## 2022-08-18 NOTE — Progress Notes (Signed)
Chief Complaint  Patient presents with   Injections    Bilateral knees    Encounter Diagnoses  Name Primary?   Chronic pain of left knee Yes   Chronic pain of right knee     REQUESTS BILATERAL KNEE INJ   HAS ISSUE W TYLENOL (HER LIVER)   Procedure note for bilateral knee injections  Procedure note left knee injection verbal consent was obtained to inject left knee joint  Timeout was completed to confirm the site of injection  The medications used were 40 mg depomedrol and 3 cc of 1% lidocaine  Anesthesia was provided by ethyl chloride and the skin was prepped with alcohol.  After cleaning the skin with alcohol a 20-gauge needle was used to inject the left knee joint. There were no complications. A sterile bandage was applied.   Procedure note right knee injection verbal consent was obtained to inject right knee joint  Timeout was completed to confirm the site of injection  The medications used were 40 mg depomedrol and 3 cc of 1% lidocaine  Anesthesia was provided by ethyl chloride and the skin was prepped with alcohol.  After cleaning the skin with alcohol a 20-gauge needle was used to inject the right knee joint. There were no complications. A sterile bandage was applied.

## 2022-08-20 ENCOUNTER — Other Ambulatory Visit: Payer: Self-pay | Admitting: Internal Medicine

## 2022-08-20 ENCOUNTER — Other Ambulatory Visit (HOSPITAL_COMMUNITY): Payer: Self-pay | Admitting: Psychiatry

## 2022-08-20 ENCOUNTER — Encounter: Payer: Self-pay | Admitting: Orthopaedic Surgery

## 2022-08-20 ENCOUNTER — Encounter: Payer: Self-pay | Admitting: Internal Medicine

## 2022-08-20 ENCOUNTER — Encounter (INDEPENDENT_AMBULATORY_CARE_PROVIDER_SITE_OTHER): Payer: Self-pay | Admitting: Gastroenterology

## 2022-08-20 DIAGNOSIS — M62838 Other muscle spasm: Secondary | ICD-10-CM

## 2022-08-22 ENCOUNTER — Encounter: Payer: Self-pay | Admitting: *Deleted

## 2022-08-23 ENCOUNTER — Encounter: Payer: Self-pay | Admitting: Internal Medicine

## 2022-08-23 ENCOUNTER — Encounter (INDEPENDENT_AMBULATORY_CARE_PROVIDER_SITE_OTHER): Payer: Self-pay | Admitting: Gastroenterology

## 2022-08-24 ENCOUNTER — Encounter: Payer: Self-pay | Admitting: Orthopaedic Surgery

## 2022-08-24 DIAGNOSIS — M549 Dorsalgia, unspecified: Secondary | ICD-10-CM

## 2022-08-24 DIAGNOSIS — S32010S Wedge compression fracture of first lumbar vertebra, sequela: Secondary | ICD-10-CM

## 2022-08-24 DIAGNOSIS — S32020A Wedge compression fracture of second lumbar vertebra, initial encounter for closed fracture: Secondary | ICD-10-CM

## 2022-08-25 DIAGNOSIS — I10 Essential (primary) hypertension: Secondary | ICD-10-CM | POA: Diagnosis not present

## 2022-08-25 DIAGNOSIS — F419 Anxiety disorder, unspecified: Secondary | ICD-10-CM | POA: Diagnosis not present

## 2022-08-25 DIAGNOSIS — R7303 Prediabetes: Secondary | ICD-10-CM | POA: Diagnosis not present

## 2022-08-25 DIAGNOSIS — E782 Mixed hyperlipidemia: Secondary | ICD-10-CM | POA: Diagnosis not present

## 2022-08-25 DIAGNOSIS — N1831 Chronic kidney disease, stage 3a: Secondary | ICD-10-CM | POA: Diagnosis not present

## 2022-08-25 DIAGNOSIS — K7581 Nonalcoholic steatohepatitis (NASH): Secondary | ICD-10-CM | POA: Diagnosis not present

## 2022-08-26 ENCOUNTER — Other Ambulatory Visit: Payer: Self-pay

## 2022-08-26 ENCOUNTER — Encounter: Payer: Self-pay | Admitting: Orthopedic Surgery

## 2022-08-26 ENCOUNTER — Encounter: Payer: Self-pay | Admitting: Internal Medicine

## 2022-08-26 LAB — CMP14+EGFR
ALT: 25 IU/L (ref 0–32)
AST: 17 IU/L (ref 0–40)
Albumin/Globulin Ratio: 1.4 (ref 1.2–2.2)
Albumin: 4.6 g/dL (ref 3.8–4.9)
Alkaline Phosphatase: 126 IU/L — ABNORMAL HIGH (ref 44–121)
BUN/Creatinine Ratio: 16 (ref 9–23)
BUN: 19 mg/dL (ref 6–24)
Bilirubin Total: 0.4 mg/dL (ref 0.0–1.2)
CO2: 25 mmol/L (ref 20–29)
Calcium: 10 mg/dL (ref 8.7–10.2)
Chloride: 98 mmol/L (ref 96–106)
Creatinine, Ser: 1.16 mg/dL — ABNORMAL HIGH (ref 0.57–1.00)
Globulin, Total: 3.3 g/dL (ref 1.5–4.5)
Glucose: 101 mg/dL — ABNORMAL HIGH (ref 70–99)
Potassium: 4.9 mmol/L (ref 3.5–5.2)
Sodium: 139 mmol/L (ref 134–144)
Total Protein: 7.9 g/dL (ref 6.0–8.5)
eGFR: 54 mL/min/{1.73_m2} — ABNORMAL LOW (ref >59)

## 2022-08-26 LAB — HEMOGLOBIN A1C
Est. average glucose Bld gHb Est-mCnc: 120 mg/dL
Hgb A1c MFr Bld: 5.8 % — ABNORMAL HIGH (ref 4.8–5.6)

## 2022-08-26 LAB — CBC WITH DIFFERENTIAL/PLATELET
Basophils Absolute: 0 10*3/uL (ref 0.0–0.2)
Basos: 0 %
EOS (ABSOLUTE): 0.3 10*3/uL (ref 0.0–0.4)
Eos: 3 %
Hematocrit: 36.7 % (ref 34.0–46.6)
Hemoglobin: 12.1 g/dL (ref 11.1–15.9)
Immature Grans (Abs): 0.1 10*3/uL (ref 0.0–0.1)
Immature Granulocytes: 1 %
Lymphocytes Absolute: 2.1 10*3/uL (ref 0.7–3.1)
Lymphs: 18 %
MCH: 32.1 pg (ref 26.6–33.0)
MCHC: 33 g/dL (ref 31.5–35.7)
MCV: 97 fL (ref 79–97)
Monocytes Absolute: 0.6 10*3/uL (ref 0.1–0.9)
Monocytes: 6 %
Neutrophils Absolute: 8.4 10*3/uL — ABNORMAL HIGH (ref 1.4–7.0)
Neutrophils: 72 %
Platelets: 492 10*3/uL — ABNORMAL HIGH (ref 150–450)
RBC: 3.77 x10E6/uL (ref 3.77–5.28)
RDW: 13.2 % (ref 11.7–15.4)
WBC: 11.5 10*3/uL — ABNORMAL HIGH (ref 3.4–10.8)

## 2022-08-26 LAB — LIPID PANEL
Chol/HDL Ratio: 2.2 ratio (ref 0.0–4.4)
Cholesterol, Total: 138 mg/dL (ref 100–199)
HDL: 63 mg/dL (ref >39)
LDL Chol Calc (NIH): 52 mg/dL (ref 0–99)
Triglycerides: 133 mg/dL (ref 0–149)
VLDL Cholesterol Cal: 23 mg/dL (ref 5–40)

## 2022-08-26 LAB — VITAMIN D 25 HYDROXY (VIT D DEFICIENCY, FRACTURES): Vit D, 25-Hydroxy: 77.5 ng/mL (ref 30.0–100.0)

## 2022-08-26 LAB — TSH: TSH: 1.72 u[IU]/mL (ref 0.450–4.500)

## 2022-08-29 ENCOUNTER — Encounter (INDEPENDENT_AMBULATORY_CARE_PROVIDER_SITE_OTHER): Payer: Self-pay | Admitting: Gastroenterology

## 2022-08-29 ENCOUNTER — Ambulatory Visit (INDEPENDENT_AMBULATORY_CARE_PROVIDER_SITE_OTHER): Payer: PPO | Admitting: Internal Medicine

## 2022-08-29 ENCOUNTER — Encounter: Payer: Self-pay | Admitting: Internal Medicine

## 2022-08-29 ENCOUNTER — Ambulatory Visit (INDEPENDENT_AMBULATORY_CARE_PROVIDER_SITE_OTHER): Payer: PPO | Admitting: Gastroenterology

## 2022-08-29 VITALS — BP 113/74 | HR 89 | Ht 63.75 in | Wt 134.6 lb

## 2022-08-29 VITALS — BP 132/78 | HR 91 | Temp 97.1°F | Ht 64.0 in | Wt 135.2 lb

## 2022-08-29 DIAGNOSIS — K7581 Nonalcoholic steatohepatitis (NASH): Secondary | ICD-10-CM

## 2022-08-29 DIAGNOSIS — R143 Flatulence: Secondary | ICD-10-CM

## 2022-08-29 DIAGNOSIS — N1831 Chronic kidney disease, stage 3a: Secondary | ICD-10-CM

## 2022-08-29 DIAGNOSIS — M8000XA Age-related osteoporosis with current pathological fracture, unspecified site, initial encounter for fracture: Secondary | ICD-10-CM | POA: Insufficient documentation

## 2022-08-29 DIAGNOSIS — M8000XD Age-related osteoporosis with current pathological fracture, unspecified site, subsequent encounter for fracture with routine healing: Secondary | ICD-10-CM

## 2022-08-29 DIAGNOSIS — I69359 Hemiplegia and hemiparesis following cerebral infarction affecting unspecified side: Secondary | ICD-10-CM

## 2022-08-29 DIAGNOSIS — S32020S Wedge compression fracture of second lumbar vertebra, sequela: Secondary | ICD-10-CM

## 2022-08-29 DIAGNOSIS — K63829 Intestinal methanogen overgrowth, unspecified: Secondary | ICD-10-CM | POA: Diagnosis not present

## 2022-08-29 DIAGNOSIS — K582 Mixed irritable bowel syndrome: Secondary | ICD-10-CM

## 2022-08-29 DIAGNOSIS — I10 Essential (primary) hypertension: Secondary | ICD-10-CM | POA: Diagnosis not present

## 2022-08-29 DIAGNOSIS — R140 Abdominal distension (gaseous): Secondary | ICD-10-CM

## 2022-08-29 DIAGNOSIS — F339 Major depressive disorder, recurrent, unspecified: Secondary | ICD-10-CM | POA: Diagnosis not present

## 2022-08-29 NOTE — Assessment & Plan Note (Signed)
Well-controlled with Trazodone and Effexor Follows up with Psychiatrist 

## 2022-08-29 NOTE — Patient Instructions (Signed)
Please continue to take medications as prescribed. ? ?Please continue to follow low carb diet and perform moderate exercise/walking at least 150 mins/week. ?

## 2022-08-29 NOTE — Assessment & Plan Note (Addendum)
Last BMP showed GFR of 54, stable Needs to maintain adequate hydration Avoid nephrotoxic agents including NSAIDs, Dced Nabumetone and Diclofenac On Lisinopril

## 2022-08-29 NOTE — Assessment & Plan Note (Addendum)
Followed by GI - last visit note reviewed

## 2022-08-29 NOTE — Patient Instructions (Signed)
Proceed with SIBO breath test at [facility] daily Titrate Miralax to achieve more regular bowel movements - increase to 1.5 to 2 capfuls per day Continue daily intake of prunes

## 2022-08-29 NOTE — Assessment & Plan Note (Signed)
Had lumbar compression fracture Last DEXA scan reviewed Plan to start [facility]

## 2022-08-29 NOTE — Progress Notes (Signed)
Maylon Peppers, M.D. Gastroenterology & Hepatology [facility] [address] [place], [place] [address]  Primary Care Physician: Lindell Spar, MD [address] [place] [address]  I will communicate my assessment and recommendations to the referring MD via EMR.  Problems: Recurrent bloating SIMO IBS-M NASH  History of Present Illness: Britney L Skipwith is a 62 y.o. female with past medical history of bipolar disorder, cervical cancer, depression, hyperlipidemia, hypertension, IBS, stroke, SIMO, CKD stage III and NASH, who presents for follow up of bloating.   The patient was last seen on 04/28/2022. At that time, the patient was continued on MiraLAX and Benefiber.  Notably, she was in the past as she was presenting recurrent dysphagia episodes.  Manometry showed changes suggestive of EGJOO but she was taking tramadol at that time she had her manometry performed.  Patient also which has in the past few weeks with complaints of recurrent bloating and discomfort in her abdomen.  A referral was sent for SIBO testing.  Patient reports that she has been taking Benefiber 2 teas spoons and Miralax 1 capful daily compliantly. She took a suppository yesterday as she was constipated. She has been presenting increased flatulence with foul smell. Has tried to eat prunes regularly.  Feels she has been having intermittent cramping in the L flank and periumbilical area.  The patient denies having any nausea, vomiting, fever, chills, hematochezia, melena, hematemesis, diarrhea, jaundice, pruritus.  Most recent blood workup from 08/25/2022 showed CMP with AST 17, ALT 25, albumin 4.6, antibiotics 126, total bili 0.4, normal electrolytes, creatinine 1.16, WBC 11.5, hemoglobin 12.1 and platelets 492.  Last EGD: 04/16/2021, no alterations were found in the esophagus, this was empirically dilated with a Savary dilator up to 18 mm.  Biopsies from  esophagus were within normal limits.  There were a few erosions in the stomach.  Biopsies were negative for H. pylori.  A 12 mm polyp was snared from the gastric fundus which showed fundic gland polyp with low-grade dysplasia.  Duodenum was normal with biopsies negative for any adenopathies.  Last Colonoscopy: 08/2018 -performed by Dr. Carol Ada.  Per report within normal limits, had biopsies of the right and left colon which were within normal limits. Last EUS 2017 - The pancreatic parenchyma was normal as well as the PD caliber. The PD was very small and difficult to measure. The CBD was normal in size (4.3 mm) and there was no evidence of any stones. No gallbladder was visualized, which is expected in her post cholecystectomy state. No other abnormalities were identified.  Past Medical History: Past Medical History:  Diagnosis Date   Allergy    grass, dust , mold   Anxiety    Arthritis    Asthma due to seasonal allergies 06/15/2020   Bipolar disorder ([place])    Carpal tunnel syndrome    Bilateral   Chest pain 09/2011   Cardiac cath-normal coronaries   Constipation    Depression    Difficulty urinating 05/31/2013   Elevated LFTs 12/16/2013   Encounter for general adult medical examination with abnormal findings 07/07/2021   GERD (gastroesophageal reflux disease)    History of kidney stones    Hyperlipemia    Hyperlipidemia    Hypertension    Mild; provoked by stress and anxiety   IBS (irritable bowel syndrome)    Intracerebral bleed ([place])    No aneurysm; followed by Dr. Carolan Shiver replaced    "lense transplant" 2022;  pt states lense don't dilate or constrict   Loss of weight 01/06/2015   Osteoporosis    Stage 3a chronic kidney disease ([place]) 03/16/2021   Stroke ([place]) 1999   hemorrhagic stroke; weakness of left side    Past Surgical History: Past Surgical History:  Procedure Laterality Date   BIOPSY  04/16/2021   Procedure: BIOPSY;  Surgeon: Montez Morita,  Quillian Quince, MD;  [facility];  Service: Gastroenterology;;  small, bowel, esophageal(proximal and distal);   BRAIN SURGERY  1999   to remove blood clot after stroke    CARDIAC CATHETERIZATION  2016   CERVICAL FUSION     CHOLECYSTECTOMY N/A 10/14/2014   Procedure: LAPAROSCOPIC CHOLECYSTECTOMY WITH INTRAOPERATIVE CHOLANGIOGRAM;  Surgeon: Jackolyn Confer, MD;  Location: [place];  Service: General;  Laterality: N/A;   CHONDROPLASTY Right 07/13/2017   Procedure: CHONDROPLASTY of patella;  Surgeon: Carole Civil, MD;  Location: [facility];  Service: Orthopedics;  Laterality: Right;   ESOPHAGEAL DILATION N/A 04/16/2021   Procedure: ESOPHAGEAL DILATION;  Surgeon: Harvel Quale, MD;  [facility];  Service: Gastroenterology;  Laterality: N/A;   ESOPHAGOGASTRODUODENOSCOPY (EGD) WITH PROPOFOL N/A 04/16/2021   Procedure: ESOPHAGOGASTRODUODENOSCOPY (EGD) WITH PROPOFOL;  Surgeon: Harvel Quale, MD;  [facility];  Service: Gastroenterology;  Laterality: N/A;  1:35, pt knows to arrive at 9:45   EUS N/A 08/21/2015   Procedure: ESOPHAGEAL ENDOSCOPIC ULTRASOUND (EUS) RADIAL;  Surgeon: Carol Ada, MD;  [facility];  Service: Endoscopy;  Laterality: N/A;   KNEE ARTHROSCOPY WITH MEDIAL MENISECTOMY Right 07/13/2017   Procedure: KNEE ARTHROSCOPY WITH PARTIAL MEDIAL MENISECTOMY;  Surgeon: Carole Civil, MD;  Location: [facility];  Service: Orthopedics;  Laterality: Right;   LEFT HEART CATHETERIZATION WITH CORONARY ANGIOGRAM N/A 09/23/2011   Procedure: LEFT HEART CATHETERIZATION WITH CORONARY ANGIOGRAM;  Surgeon: Thayer Headings, MD;  Location: [facility] C[facility] LAB;  Service: Cardiovascular;  Laterality: N/A;   LIPOMA EXCISION Left 11/18/2013   Procedure: EXCISION OF SOFT TISSUE MASS-LEFT THIGH;  Surgeon: Jamesetta So, MD;  Location: [facility];  Service: General;  Laterality: Left;   NASAL SEPTOPLASTY W/ TURBINOPLASTY Bilateral 08/30/2021   Procedure: NASAL  SEPTOPLASTY WITH BILATERAL TURBINATE REDUCTION;  Surgeon: Leta Baptist, MD;  Location: [place];  Service: ENT;  Laterality: Bilateral;   POLYPECTOMY  04/16/2021   Procedure: POLYPECTOMY;  Surgeon: Harvel Quale, MD;  [facility];  Service: Gastroenterology;;  gastric   RECTOCELE REPAIR     x2   RECTOCELE REPAIR N/A 04/04/2017   Procedure: POSTERIOR REPAIR (RECTOCELE);  Surgeon: Jonnie Kind, MD;  Location: [facility];  Service: Gynecology;  Laterality: N/A;   TOTAL ABDOMINAL HYSTERECTOMY      Family History: Family History  Problem Relation Age of Onset   Cancer Mother        breast    Hypertension Mother    Hyperlipidemia Mother    Depression Mother    Anxiety disorder Mother    COPD Mother    Arthritis Mother        rheumatoid   Drug abuse Sister    Coronary artery disease Paternal Grandfather    Coronary artery disease Paternal Uncle    Depression Cousin    Drug abuse Cousin     Social History: Social History   Tobacco Use  Smoking Status Former   Packs/day: 1.00   Years: 19.00   Total pack years: 19.00   Types: Cigarettes   Quit date: 09/01/1997  Years since quitting: 25.0   Passive exposure: Past  Smokeless Tobacco Never  Tobacco Comments   Quit smoking 1999 , previous 20 pack years   Social History   Substance and Sexual Activity  Alcohol Use Yes   Comment: 1 drink every other week   Social History   Substance and Sexual Activity  Drug Use No    Allergies: Allergies  Allergen Reactions   Morphine And Related Hives   Promethazine Hcl Other (See Comments)    Causes patient to become Hyper    Medications: Current Outpatient Medications  Medication Sig Dispense Refill   acetaminophen (TYLENOL) 500 MG tablet Take 650 mg by mouth daily at 6 (six) AM.     ALPRAZolam (XANAX) 1 MG tablet Take 1 tablet (1 mg total) by mouth 3 (three) times daily. 90 tablet 2   atorvastatin (LIPITOR) 40 MG tablet Take 1 tablet (40  mg total) by mouth daily. 90 tablet 3   Calcium 500-125 MG-UNIT TABS Take 1 tablet by mouth daily with supper.     Carboxymethylcellulose Sod PF 0.5 % SOLN Place 1 drop into both eyes daily as needed (dry eyes).     cyclobenzaprine (FLEXERIL) 5 MG tablet TAKE 1 TABLET BY MOUTH THREE TIMES DAILY AS NEEDED FOR MUSCLE SPASMS. 30 tablet 1   diclofenac Sodium (VOLTAREN) 1 % GEL Apply 1 application topically daily as needed (pain).     estradiol (ESTRACE) 0.1 MG/GM vaginal cream Place 0.5 g vaginally 2 (two) times a week. Place 0.5g nightly for two weeks then twice a week after 30 g 11   ezetimibe (ZETIA) 10 MG tablet Take 1 tablet (10 mg total) by mouth daily. 90 tablet 3   famotidine (PEPCID) 20 MG tablet Take 20 mg by mouth 2 (two) times daily. As needed     fexofenadine (ALLEGRA) 180 MG tablet Take 180 mg by mouth daily.     lidocaine-prilocaine (EMLA) cream Apply 1 Application topically at bedtime.     lisinopril (ZESTRIL) 20 MG tablet TAKE 1 TABLET BY MOUTH DAILY. 90 tablet 0   methylphenidate (RITALIN) 20 MG tablet Take 1 tablet (20 mg total) by mouth 2 (two) times daily with breakfast and lunch. 60 tablet 0   montelukast (SINGULAIR) 10 MG tablet TAKE (1) TABLET BY MOUTH AT BEDTIME. 90 tablet 0   Multiple Vitamin (MULITIVITAMIN WITH MINERALS) TABS Take 1 tablet by mouth daily with breakfast.     Nystatin POWD by Does not apply route. As needed.     Olopatadine HCl 0.2 % SOLN Place 1 drop into both eyes in the morning and at bedtime.     Omega-3 Fatty Acids (FISH OIL) 1000 MG CAPS Take 2,000 mg by mouth.     omeprazole (PRILOSEC) 40 MG capsule Take 1 capsule (40 mg total) by mouth 2 (two) times daily. 60 capsule 5   OVER THE COUNTER MEDICATION Milk thistle once per day.  Mushrooms once daily.  Vit K 2 once per day.  Beet root gummies daily     oxybutynin (DITROPAN-XL) 10 MG 24 hr tablet TAKE (1) TABLET BY MOUTH AT BEDTIME 90 tablet 0   Probiotic Product (TRUBIOTICS PO) Take 1 capsule by  mouth daily. Takes 25 cfu daily.     pyridOXINE (VITAMIN B-6) 100 MG tablet Take 100 mg by mouth daily.     traZODone (DESYREL) 100 MG tablet TAKE (2) TABLETS BY MOUTH AT BEDTIME. 60 tablet 0   venlafaxine XR (EFFEXOR XR) 150 MG 24 hr  capsule Take 1 capsule (150 mg total) by mouth daily with breakfast. 90 capsule 2   HYDROcodone-acetaminophen (NORCO/VICODIN) 5-325 MG tablet Take 1 tablet by mouth every 4 (four) hours as needed. (Patient not taking: Reported on 08/29/2022) 20 tablet 0   TYMLOS 3120 MCG/1.56ML SOPN SMARTSIG:1 Pre-Filled Pen Syringe SUB-Q Every Night (Patient not taking: Reported on 08/29/2022)     No current facility-administered medications for this visit.    Review of Systems: GENERAL: negative for malaise, night sweats HEENT: No changes in hearing or vision, no nose bleeds or other nasal problems. NECK: Negative for lumps, goiter, pain and significant neck swelling RESPIRATORY: Negative for cough, wheezing CARDIOVASCULAR: Negative for chest pain, leg swelling, palpitations, orthopnea GI: SEE HPI MUSCULOSKELETAL: Negative for joint pain or swelling, back pain, and muscle pain. SKIN: Negative for lesions, rash PSYCH: Negative for sleep disturbance, mood disorder and recent psychosocial stressors. HEMATOLOGY Negative for prolonged bleeding, bruising easily, and swollen nodes. ENDOCRINE: Negative for cold or heat intolerance, polyuria, polydipsia and goiter. NEURO: negative for tremor, gait imbalance, syncope and seizures. The remainder of the review of systems is noncontributory.   Physical Exam: BP 132/78 (BP Location: Right Arm, Patient Position: Sitting, Cuff Size: Normal)   Pulse 91   Temp (!) 97.1 F (36.2 C) (Temporal)   Ht '5\' 4"'$  (1.626 m)   Wt 135 lb 3.2 oz (61.3 kg)   BMI 23.21 kg/m  GENERAL: The patient is AO x3, in no acute distress. HEENT: Head is normocephalic and atraumatic. EOMI are intact. Mouth is well hydrated and without lesions. NECK: Supple. No  masses LUNGS: Clear to auscultation. No presence of rhonchi/wheezing/rales. Adequate chest expansion HEART: RRR, normal s1 and s2. ABDOMEN: mildly tender in the R flank, no guarding, no peritoneal signs, and nondistended. BS +. No masses. EXTREMITIES: Without any cyanosis, clubbing, rash, lesions or edema. NEUROLOGIC: AOx3, no focal motor deficit. SKIN: no jaundice, no rashes  Imaging/Labs: as above  I personally reviewed and interpreted the available labs, imaging and endoscopic files.  Impression and Plan: GUILA HOMOLA is a 62 y.o. female with past medical history of bipolar disorder, cervical cancer, depression, hyperlipidemia, hypertension, IBS, stroke, SIMO, CKD stage III and NASH, who presents for follow up of bloating.'s mental events patient has presented with recurrent episodes of bloating and abdominal discomfort with foul smelling flatulence which are concerning for recurrent SIBO/SIMO.  She initially improved in the past with a course of neomycin and Xifaxan but her symptoms have not recurred since then.  I discussed in detail with her that we will need to proceed with the scheduled SIBO breath test to determine if she has Archaea bacteria in her gut.  She understood and agreed with this.  For now, we will continue with her current dose of Benefiber but I advised her to increase the use of MiraLAX to achieve more regular bowel movements.  -Proceed with SIBO breath test at [facility] daily -Titrate Miralax to achieve more regular bowel movements - increase to 1.5 to 2 capfuls per day -Continue daily intake of prunes  All questions were answered.      Maylon Peppers, MD Gastroenterology and Hepatology [facility]

## 2022-08-29 NOTE — Assessment & Plan Note (Signed)
Liver enzymes WNL Last [place] RUQ reviewed, showed hepatic steatosis Her BMI is 23.29, already follows low carb diet Followed by GI

## 2022-08-29 NOTE — Assessment & Plan Note (Signed)
Had a mechanical fall at home, L2 compression fracture Had a lumbar back brace, followed by orthopedic surgery Pain improved, but has has intermittent pain - RUQ area pain likely referred pain

## 2022-08-29 NOTE — Assessment & Plan Note (Signed)
In 1999, had residual left sided hemiplegia, no neurologic symptoms related to stroke currently ?On statin and Zetia currently ?

## 2022-08-29 NOTE — Assessment & Plan Note (Signed)
BP Readings from Last 1 Encounters:  08/29/22 113/74   Well-controlled with lisinopril 20 mg QD Counseled for compliance with the medications Advised DASH diet and moderate exercise/walking, at least 150 mins/week

## 2022-08-29 NOTE — Progress Notes (Signed)
Established Patient Office Visit  Subjective:  Patient ID: [name], female    DOB: Jun 04, 1961  Age: 62 y.o. MRN: [id number]  CC:  Chief Complaint  Patient presents with   Hypertension    Follow up    HPI [name] is a 62 y.o. female with past medical history of HTN, CKD, asthma due to seasonal allergies, h/o hemorrhagic stroke in 1999 (unclear etiology), HLD, anxiety with depression, urinary incontinence and GERD who presents for f/u of her chronic medical conditions.  HTN: Her BP has been wnl at home.  She has been taking lisinopril 20 mg daily again.  Her BP was well controlled today.  She denies any headache, chest pain or palpitations.  Lumbar compression fracture: She had a fall at nighttime at her home while using restroom in 10/23.  She sustained a L2 compression fracture, had a brace and is followed by neurosurgeon.  She has low back pain, for which she takes tramadol as needed.  Denies any pain upon deep breathing.  She has started moderate activity as tolerated.  Osteoporosis: She is planned to start Tymlos now.  CKD: Her last CMP showed GFR of 54.  Denies any dysuria, hematuria or urinary hesitance or resistance.  She takes oxybutynin for urinary incontinence.  NASH: Followed by GI.  She has RUQ area pain, which is more likely due to radiating pain from lumbar spine.  Her pain is intermittent, worse with movement and unrelated to food intake.  She has chronic bloating, and was found to have intestinal methanogen overgrowth. She is planned to see [place] for it.     Past Medical History:  Diagnosis Date   Allergy    grass, dust , mold   Anxiety    Arthritis    Asthma due to seasonal allergies 06/15/2020   Bipolar disorder ([place])    Carpal tunnel syndrome    Bilateral   Chest pain 09/2011   Cardiac cath-normal coronaries   Constipation    Depression    Difficulty urinating 05/31/2013   Elevated LFTs 12/16/2013   Encounter for general adult  medical examination with abnormal findings 07/07/2021   GERD (gastroesophageal reflux disease)    History of kidney stones    Hyperlipemia    Hyperlipidemia    Hypertension    Mild; provoked by stress and anxiety   IBS (irritable bowel syndrome)    Intracerebral bleed ([place])    No aneurysm; followed by Dr. Carolan Shiver replaced    "lense transplant" 2022; pt states lense don't dilate or constrict   Loss of weight 01/06/2015   Osteoporosis    Stage 3a chronic kidney disease ([place]) 03/16/2021   Stroke ([place]) 1999   hemorrhagic stroke; weakness of left side    Past Surgical History:  Procedure Laterality Date   BIOPSY  04/16/2021   Procedure: BIOPSY;  Surgeon: Montez Morita, Quillian Quince, MD;  [facility];  Service: Gastroenterology;;  small, bowel, esophageal(proximal and distal);   [place]   to remove blood clot after stroke    CARDIAC CATHETERIZATION  2016   CERVICAL FUSION     CHOLECYSTECTOMY N/A 10/14/2014   Procedure: LAPAROSCOPIC CHOLECYSTECTOMY WITH INTRAOPERATIVE CHOLANGIOGRAM;  Surgeon: Jackolyn Confer, MD;  Location: [facility];  Service: General;  Laterality: N/A;   CHONDROPLASTY Right 07/13/2017   Procedure: CHONDROPLASTY of patella;  Surgeon: Carole Civil, MD;  Location: [facility];  Service: Orthopedics;  Laterality: Right;   ESOPHAGEAL DILATION  N/A 04/16/2021   Procedure: ESOPHAGEAL DILATION;  Surgeon: Harvel Quale, MD;  [facility];  Service: Gastroenterology;  Laterality: N/A;   ESOPHAGOGASTRODUODENOSCOPY (EGD) WITH PROPOFOL N/A 04/16/2021   Procedure: ESOPHAGOGASTRODUODENOSCOPY (EGD) WITH PROPOFOL;  Surgeon: Harvel Quale, MD;  [facility];  Service: Gastroenterology;  Laterality: N/A;  1:35, pt knows to arrive at 9:45   EUS N/A 08/21/2015   Procedure: ESOPHAGEAL ENDOSCOPIC ULTRASOUND (EUS) RADIAL;  Surgeon: Carol Ada, MD;  [facility]: [facility];  Service: Endoscopy;  Laterality: N/A;   KNEE  ARTHROSCOPY WITH MEDIAL MENISECTOMY Right 07/13/2017   Procedure: KNEE ARTHROSCOPY WITH PARTIAL MEDIAL MENISECTOMY;  Surgeon: Carole Civil, MD;  Location: [facility];  Service: Orthopedics;  Laterality: Right;   LEFT HEART CATHETERIZATION WITH CORONARY ANGIOGRAM N/A 09/23/2011   Procedure: LEFT HEART CATHETERIZATION WITH CORONARY ANGIOGRAM;  Surgeon: Thayer Headings, MD;  Location: [facility];  Service: Cardiovascular;  Laterality: N/A;   LIPOMA EXCISION Left 11/18/2013   Procedure: EXCISION OF SOFT TISSUE MASS-LEFT THIGH;  Surgeon: Jamesetta So, MD;  Location: [facility];  Service: General;  Laterality: Left;   NASAL SEPTOPLASTY W/ TURBINOPLASTY Bilateral 08/30/2021   Procedure: NASAL SEPTOPLASTY WITH BILATERAL TURBINATE REDUCTION;  Surgeon: Leta Baptist, MD;  Location: [place];  Service: ENT;  Laterality: Bilateral;   POLYPECTOMY  04/16/2021   Procedure: POLYPECTOMY;  Surgeon: Harvel Quale, MD;  [facility];  Service: Gastroenterology;;  gastric   RECTOCELE REPAIR     x2   RECTOCELE REPAIR N/A 04/04/2017   Procedure: POSTERIOR REPAIR (RECTOCELE);  Surgeon: Jonnie Kind, MD;  Location: [facility];  Service: Gynecology;  Laterality: N/A;   TOTAL ABDOMINAL HYSTERECTOMY      Family History  Problem Relation Age of Onset   Cancer Mother        breast    Hypertension Mother    Hyperlipidemia Mother    Depression Mother    Anxiety disorder Mother    COPD Mother    Arthritis Mother        rheumatoid   Drug abuse Sister    Coronary artery disease Paternal Grandfather    Coronary artery disease Paternal Uncle    Depression Cousin    Drug abuse Cousin     Social History   Socioeconomic History   Marital status: Married    Spouse name: Sonia Side   Number of children: 0   Years of education: HS   Highest education level: Not on file  Occupational History   Occupation: unemployed    Comment: pending disability  Tobacco Use   Smoking status:  Former    Packs/day: 1.00    Years: 19.00    Total pack years: 19.00    Types: Cigarettes    Quit date: 09/01/1997    Years since quitting: 25.0    Passive exposure: Past   Smokeless tobacco: Never   Tobacco comments:    Quit smoking 1999 , previous 20 pack years  Vaping Use   Vaping Use: Never used  Substance and Sexual Activity   Alcohol use: Yes    Comment: 1 drink every other week   Drug use: No   Sexual activity: Not Currently    Partners: Male    Birth control/protection: Surgical    Comment: hyst   Other Topics Concern   Not on file  Social History Narrative   Currently unable to work   Lives in [place]   Married  Patient drinks 1 cup of caffeine daily.   Patient is right handed.    [facility]ined the [employer] to get more exercise   Social Determinants of Health   Financial Resource Strain: Low Risk  (03/29/2022)   Overall Financial Resource Strain (CARDIA)    Difficulty of Paying Living Expenses: Not hard at all  Food Insecurity: No Food Insecurity (03/29/2022)   Hunger Vital Sign    Worried About Running Out of Food in the Last Year: Never true    Ran Out of Food in the Last Year: Never true  Transportation Needs: No Transportation Needs (03/29/2022)   PRAPARE - Hydrologist (Medical): No    Lack of Transportation (Non-Medical): No  Physical Activity: Inactive (03/29/2022)   Exercise Vital Sign    Days of Exercise per Week: 0 days    Minutes of Exercise per Session: 0 min  Stress: Stress Concern Present (03/29/2022)   Halfway    Feeling of Stress : To some extent  Social Connections: Moderately Isolated (03/29/2022)   Social Connection and Isolation Panel [NHANES]    Frequency of Communication with Friends and Family: Twice a week    Frequency of Social Gatherings with Friends and Family: Once a week    Attends Religious Services: Never    Marine scientist or  Organizations: No    Attends Archivist Meetings: Never    Marital Status: Married  Human resources officer Violence: Not At Risk (03/29/2022)   Humiliation, Afraid, Rape, and Kick questionnaire    Fear of Current or Ex-Partner: No    Emotionally Abused: No    Physically Abused: No    Sexually Abused: No    Outpatient Medications Prior to Visit  Medication Sig Dispense Refill   acetaminophen (TYLENOL) 500 MG tablet Take 650 mg by mouth daily at 6 (six) AM.     ALPRAZolam (XANAX) 1 MG tablet Take 1 tablet (1 mg total) by mouth 3 (three) times daily. 90 tablet 2   atorvastatin (LIPITOR) 40 MG tablet Take 1 tablet (40 mg total) by mouth daily. 90 tablet 3   Calcium 500-125 MG-UNIT TABS Take 1 tablet by mouth daily with supper.     Carboxymethylcellulose Sod PF 0.5 % SOLN Place 1 drop into both eyes daily as needed (dry eyes).     cyclobenzaprine (FLEXERIL) 5 MG tablet TAKE 1 TABLET BY MOUTH THREE TIMES DAILY AS NEEDED FOR MUSCLE SPASMS. 30 tablet 1   diclofenac Sodium (VOLTAREN) 1 % GEL Apply 1 application topically daily as needed (pain).     estradiol (ESTRACE) 0.1 MG/GM vaginal cream Place 0.5 g vaginally 2 (two) times a week. Place 0.5g nightly for two weeks then twice a week after 30 g 11   ezetimibe (ZETIA) 10 MG tablet Take 1 tablet (10 mg total) by mouth daily. 90 tablet 3   famotidine (PEPCID) 20 MG tablet Take 20 mg by mouth 2 (two) times daily. As needed     fexofenadine (ALLEGRA) 180 MG tablet Take 180 mg by mouth daily.     lidocaine-prilocaine (EMLA) cream Apply 1 Application topically at bedtime.     lisinopril (ZESTRIL) 20 MG tablet TAKE 1 TABLET BY MOUTH DAILY. 90 tablet 0   methylphenidate (RITALIN) 20 MG tablet Take 1 tablet (20 mg total) by mouth 2 (two) times daily with breakfast and lunch. 60 tablet 0   montelukast (SINGULAIR) 10 MG tablet TAKE (1) TABLET  BY MOUTH AT BEDTIME. 90 tablet 0   Multiple Vitamin (MULITIVITAMIN WITH MINERALS) TABS Take 1 tablet by mouth  daily with breakfast.     Nystatin POWD by Does not apply route. As needed.     Olopatadine HCl 0.2 % SOLN Place 1 drop into both eyes in the morning and at bedtime.     Omega-3 Fatty Acids (FISH OIL) 1000 MG CAPS Take 2,000 mg by mouth.     omeprazole (PRILOSEC) 40 MG capsule Take 1 capsule (40 mg total) by mouth 2 (two) times daily. 60 capsule 5   OVER THE COUNTER MEDICATION Milk thistle once per day.  Mushrooms once daily.  Vit K 2 once per day.  Beet root gummies daily     oxybutynin (DITROPAN-XL) 10 MG 24 hr tablet TAKE (1) TABLET BY MOUTH AT BEDTIME 90 tablet 0   Probiotic Product (TRUBIOTICS PO) Take 1 capsule by mouth daily. Takes 25 cfu daily.     pyridOXINE (VITAMIN B-6) 100 MG tablet Take 100 mg by mouth daily.     traZODone (DESYREL) 100 MG tablet TAKE (2) TABLETS BY MOUTH AT BEDTIME. 60 tablet 0   TYMLOS 3120 MCG/1.56ML SOPN      venlafaxine XR (EFFEXOR XR) 150 MG 24 hr capsule Take 1 capsule (150 mg total) by mouth daily with breakfast. 90 capsule 2   HYDROcodone-acetaminophen (NORCO/VICODIN) 5-325 MG tablet Take 1 tablet by mouth every 4 (four) hours as needed. 20 tablet 0   No facility-administered medications prior to visit.    Allergies  Allergen Reactions   Morphine And Related Hives   Promethazine Hcl Other (See Comments)    Causes patient to become Hyper    ROS Review of Systems  Constitutional:  Negative for chills and fever.  HENT:  Negative for congestion, sinus pressure and sinus pain.   Eyes:  Negative for pain and discharge.  Respiratory:  Negative for cough and shortness of breath.   Cardiovascular:  Negative for chest pain and palpitations.  Gastrointestinal:  Positive for abdominal pain and constipation. Negative for diarrhea, nausea and vomiting.  Endocrine: Negative for polydipsia and polyuria.  Genitourinary:  Negative for dysuria and hematuria.  Musculoskeletal:  Positive for arthralgias and back pain. Negative for neck stiffness.  Skin:   Negative for rash.  Allergic/Immunologic: Positive for environmental allergies.  Neurological:  Negative for dizziness and weakness.  Psychiatric/Behavioral:  Negative for agitation and behavioral problems.       Objective:    Physical Exam Vitals reviewed.  Constitutional:      General: [name] "[name]" is not in acute distress.    Appearance: [name] "[name]" is not diaphoretic.  HENT:     Head: Normocephalic and atraumatic.     Nose: Nose normal. No congestion.     Mouth/Throat:     Mouth: Mucous membranes are moist.     Pharynx: No posterior oropharyngeal erythema.  Eyes:     General: No scleral icterus.    Extraocular Movements: Extraocular movements intact.  Cardiovascular:     Rate and Rhythm: Normal rate and regular rhythm.     Pulses: Normal pulses.     Heart sounds: Normal heart sounds. No murmur heard. Pulmonary:     Breath sounds: Normal breath sounds. No wheezing or rales.  Musculoskeletal:     Cervical back: Neck supple. No tenderness.     Comments: Lumbar brace in place  Skin:    General: Skin is warm.  Findings: No rash.  Neurological:     General: No focal deficit present.     Mental Status: [name] "[name]" is alert and oriented to person, place, and time.     Comments: Facial droop, chronic  Psychiatric:        Mood and Affect: Mood normal.        Behavior: Behavior normal.     BP 113/74   Pulse 89   Ht 5' 3.75" (1.619 m)   Wt 134 lb 9.6 oz (61.1 kg)   SpO2 95%   BMI 23.29 kg/m  Wt Readings from Last 3 Encounters:  08/29/22 134 lb 9.6 oz (61.1 kg)  08/29/22 135 lb 3.2 oz (61.3 kg)  08/11/22 139 lb (63 kg)    Lab Results  Component Value Date   TSH 1.720 08/25/2022   Lab Results  Component Value Date   WBC 11.5 (H) 08/25/2022   HGB 12.1 08/25/2022   HCT 36.7 08/25/2022   MCV 97 08/25/2022   PLT 492 (H) 08/25/2022   Lab Results  Component Value Date   NA 139 08/25/2022   K 4.9 08/25/2022   CO2 25  08/25/2022   GLUCOSE 101 (H) 08/25/2022   BUN 19 08/25/2022   CREATININE 1.16 (H) 08/25/2022   BILITOT 0.4 08/25/2022   ALKPHOS 126 (H) 08/25/2022   AST 17 08/25/2022   ALT 25 08/25/2022   PROT 7.9 08/25/2022   ALBUMIN 4.6 08/25/2022   CALCIUM 10.0 08/25/2022   ANIONGAP 8 08/25/2021   EGFR 54 (L) 08/25/2022   Lab Results  Component Value Date   CHOL 138 08/25/2022   Lab Results  Component Value Date   HDL 63 08/25/2022   Lab Results  Component Value Date   LDLCALC 52 08/25/2022   Lab Results  Component Value Date   TRIG 133 08/25/2022   Lab Results  Component Value Date   CHOLHDL 2.2 08/25/2022   Lab Results  Component Value Date   HGBA1C 5.8 (H) 08/25/2022      Assessment & Plan:   Problem List Items Addressed This Visit       Cardiovascular and Mediastinum   Hypertension - Primary    BP Readings from Last 1 Encounters:  08/29/22 113/74  Well-controlled with lisinopril 20 mg QD Counseled for compliance with the medications Advised DASH diet and moderate exercise/walking, at least 150 mins/week        Digestive   NASH (nonalcoholic steatohepatitis)    Liver enzymes WNL Last [place] RUQ reviewed, showed hepatic steatosis Her BMI is 23.29, already follows low carb diet Followed by GI      Intestinal methanogen overgrowth    Followed by GI - last visit note reviewed        Musculoskeletal and Integument   Lumbar compression fracture (HCC)    Had a mechanical fall at home, L2 compression fracture Had a lumbar back brace, followed by orthopedic surgery Pain improved, but has has intermittent pain - RUQ area pain likely referred pain      Osteoporosis with current pathological fracture    Had lumbar compression fracture Last DEXA scan reviewed Plan to start Tymlos        Genitourinary   Stage 3a chronic kidney disease ([place])    Last BMP showed GFR of 54, stable Needs to maintain adequate hydration Avoid nephrotoxic agents including NSAIDs,  Dced Nabumetone and Diclofenac On Lisinopril        Other   Depression  Well-controlled with Trazodone and Effexor Follows up with Psychiatrist      History of hemorrhagic stroke with residual hemiplegia ([place])    In 1999, had residual left sided hemiplegia, no neurologic symptoms related to stroke currently On statin and Zetia currently       No orders of the defined types were placed in this encounter.   Follow-up: Return in about 6 months (around 02/27/2023).    Lindell Spar, MD

## 2022-08-30 ENCOUNTER — Encounter: Payer: Self-pay | Admitting: Internal Medicine

## 2022-08-30 ENCOUNTER — Encounter: Payer: Self-pay | Admitting: Orthopaedic Surgery

## 2022-08-31 ENCOUNTER — Encounter (INDEPENDENT_AMBULATORY_CARE_PROVIDER_SITE_OTHER): Payer: Self-pay | Admitting: Gastroenterology

## 2022-09-01 ENCOUNTER — Encounter: Payer: Self-pay | Admitting: Radiology

## 2022-09-01 ENCOUNTER — Encounter: Payer: Self-pay | Admitting: Orthopaedic Surgery

## 2022-09-02 ENCOUNTER — Telehealth (INDEPENDENT_AMBULATORY_CARE_PROVIDER_SITE_OTHER): Payer: PPO | Admitting: Psychiatry

## 2022-09-02 ENCOUNTER — Encounter (HOSPITAL_COMMUNITY): Payer: Self-pay

## 2022-09-02 ENCOUNTER — Encounter (HOSPITAL_COMMUNITY): Payer: Self-pay | Admitting: Psychiatry

## 2022-09-02 DIAGNOSIS — F988 Other specified behavioral and emotional disorders with onset usually occurring in childhood and adolescence: Secondary | ICD-10-CM | POA: Diagnosis not present

## 2022-09-02 DIAGNOSIS — F332 Major depressive disorder, recurrent severe without psychotic features: Secondary | ICD-10-CM

## 2022-09-02 MED ORDER — METHYLPHENIDATE HCL 20 MG PO TABS: 20.0000 mg | ORAL_TABLET | Freq: Two times a day (BID) | ORAL | 0 refills | Status: DC

## 2022-09-02 MED ORDER — TRAZODONE HCL 100 MG PO TABS: ORAL_TABLET | ORAL | 0 refills | Status: DC

## 2022-09-02 MED ORDER — ALPRAZOLAM 1 MG PO TABS: 1.0000 mg | ORAL_TABLET | Freq: Three times a day (TID) | ORAL | 2 refills | Status: DC

## 2022-09-02 MED ORDER — VENLAFAXINE HCL ER 150 MG PO CP24: 150.0000 mg | ORAL_CAPSULE | Freq: Every day | ORAL | 2 refills | Status: DC

## 2022-09-02 NOTE — Progress Notes (Signed)
Virtual Visit via Telephone Note  I connected with [name] on 09/02/22 at 10:20 AM EST by telephone and verified that I am speaking with the correct person using two identifiers.  Location: Patient: home Provider: office   I discussed the limitations, risks, security and privacy concerns of performing an evaluation and management service by telephone and the availability of in person appointments. I also discussed with the patient that there may be a patient responsible charge related to this service. The patient expressed understanding and agreed to proceed.      I discussed the assessment and treatment plan with the patient. The patient was provided an opportunity to ask questions and all were answered. The patient agreed with the plan and demonstrated an understanding of the instructions.   The patient was advised to call back or seek an in-person evaluation if the symptoms worsen or if the condition fails to improve as anticipated.  I provided 15 minutes of non-face-to-face time during this encounter.   Diamond Spiller, MD  [facility] MD/PA/NP OP Progress Note  09/02/2022 10:44 AM [name]  MRN:  [id number]  Chief Complaint:  Chief Complaint  Patient presents with   Depression   Anxiety   Follow-up   HPI: T his patient is a 62 year old married white female who lives with her husband in [place]. She has no children. 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    The patient returns for follow-up after 3 months.  She states that she has been diagnosed with osteoporosis and osteoarthritis.  She is still having a lot of chronic back pain.  Back in the fall she fell and fractured her L2 vertebrae.  She tried tramadol but she decided to stop it because she did not "want to get hooked on anything."  She only uses Tylenol.  She is about to start physical therapy.  She is frustrated with the situation because she would like to be  more active and start working out in her garden.  She is hoping things will change when the weather gets warmer.  Overall her mood has been stable although she does have a little bit more anxiety.  The methylphenidate continues to help with her focus.  She denies any thoughts of self-harm or suicide. Visit Diagnosis:    ICD-10-CM   1. Major depressive disorder, recurrent, severe without psychotic features ([place])  F33.2     2. ADD (attention deficit disorder) without hyperactivity  F98.8       Past Psychiatric History: Past outpatient treatment for depression  Past Medical History:  Past Medical History:  Diagnosis Date   Allergy    grass, dust , mold   Anxiety    Arthritis    Asthma due to seasonal allergies 06/15/2020   Bipolar disorder ([place])    Carpal tunnel syndrome    Bilateral   Chest pain 09/2011   Cardiac cath-normal coronaries   Constipation    Depression    Difficulty urinating 05/31/2013   Elevated LFTs 12/16/2013   Encounter for general adult medical examination with abnormal findings 07/07/2021   GERD (gastroesophageal reflux disease)    History of kidney stones    Hyperlipemia    Hyperlipidemia    Hypertension    Mild; provoked by stress and anxiety   IBS (irritable bowel syndrome)    Intracerebral bleed ([place])    No aneurysm; followed by Dr. Carolan Shiver replaced    "  lense transplant" 2022; pt states lense don't dilate or constrict   Loss of weight 01/06/2015   Osteoporosis    Stage 3a chronic kidney disease ([place]) 03/16/2021   Stroke ([place]) 1999   hemorrhagic stroke; weakness of left side    Past Surgical History:  Procedure Laterality Date   BIOPSY  04/16/2021   Procedure: BIOPSY;  Surgeon: Montez Morita, Quillian Quince, MD;  [facility];  Service: Gastroenterology;;  small, bowel, esophageal(proximal and distal);   BRAIN SURGERY  1999   to remove blood clot after stroke    CARDIAC CATHETERIZATION  2016   CERVICAL FUSION      CHOLECYSTECTOMY N/A 10/14/2014   Procedure: LAPAROSCOPIC CHOLECYSTECTOMY WITH INTRAOPERATIVE CHOLANGIOGRAM;  Surgeon: Jackolyn Confer, MD;  Location: [place];  Service: General;  Laterality: N/A;   CHONDROPLASTY Right 07/13/2017   Procedure: CHONDROPLASTY of patella;  Surgeon: Carole Civil, MD;  Location: [facility];  Service: Orthopedics;  Laterality: Right;   ESOPHAGEAL DILATION N/A 04/16/2021   Procedure: ESOPHAGEAL DILATION;  Surgeon: Harvel Quale, MD;  [facility];  Service: Gastroenterology;  Laterality: N/A;   ESOPHAGOGASTRODUODENOSCOPY (EGD) WITH PROPOFOL N/A 04/16/2021   Procedure: ESOPHAGOGASTRODUODENOSCOPY (EGD) WITH PROPOFOL;  Surgeon: Harvel Quale, MD;  [facility];  Service: Gastroenterology;  Laterality: N/A;  1:35, pt knows to arrive at 9:45   EUS N/A 08/21/2015   Procedure: ESOPHAGEAL ENDOSCOPIC ULTRASOUND (EUS) RADIAL;  Surgeon: Carol Ada, MD;  [facility];  Service: Endoscopy;  Laterality: N/A;   KNEE ARTHROSCOPY WITH MEDIAL MENISECTOMY Right 07/13/2017   Procedure: KNEE ARTHROSCOPY WITH PARTIAL MEDIAL MENISECTOMY;  Surgeon: Carole Civil, MD;  Location: [facility];  Service: Orthopedics;  Laterality: Right;   LEFT HEART CATHETERIZATION WITH CORONARY ANGIOGRAM N/A 09/23/2011   Procedure: LEFT HEART CATHETERIZATION WITH CORONARY ANGIOGRAM;  Surgeon: Thayer Headings, MD;  Location[facility] C[facility] LAB;  Service: Cardiovascular;  Laterality: N/A;   LIPOMA EXCISION Left 11/18/2013   Procedure: EXCISION OF SOFT TISSUE MASS-LEFT THIGH;  Surgeon: Jamesetta So, MD;  Location: [facility];  Service: General;  Laterality: Left;   NASAL SEPTOPLASTY W/ TURBINOPLASTY Bilateral 08/30/2021   Procedure: NASAL SEPTOPLASTY WITH BILATERAL TURBINATE REDUCTION;  Surgeon: Leta Baptist, MD;  Location: [place];  Service: ENT;  Laterality: Bilateral;   POLYPECTOMY  04/16/2021   Procedure: POLYPECTOMY;  Surgeon: Harvel Quale, MD;  [facility];  Service: Gastroenterology;;  gastric   RECTOCELE REPAIR     x2   RECTOCELE REPAIR N/A 04/04/2017   Procedure: POSTERIOR REPAIR (RECTOCELE);  Surgeon: Jonnie Kind, MD;  Location: [facility];  Service: Gynecology;  Laterality: N/A;   TOTAL ABDOMINAL HYSTERECTOMY      Family Psychiatric History: See below  Family History:  Family History  Problem Relation Age of Onset   Cancer Mother        breast    Hypertension Mother    Hyperlipidemia Mother    Depression Mother    Anxiety disorder Mother    COPD Mother    Arthritis Mother        rheumatoid   Drug abuse Sister    Coronary artery disease Paternal Grandfather    Coronary artery disease Paternal Uncle    Depression Cousin    Drug abuse Cousin     Social History:  Social History   Socioeconomic History   Marital status: Married    Spouse name: Sonia Side   Number of children: 0  Years of education: HS   Highest education level: Not on file  Occupational History   Occupation: unemployed    Comment: pending disability  Tobacco Use   Smoking status: Former    Packs/day: 1.00    Years: 19.00    Total pack years: 19.00    Types: Cigarettes    Quit date: 09/01/1997    Years since quitting: 25.0    Passive exposure: Past   Smokeless tobacco: Never   Tobacco comments:    Quit smoking 1999 , previous 20 pack years  Vaping Use   Vaping Use: Never used  Substance and Sexual Activity   Alcohol use: Yes    Comment: 1 drink every other week   Drug use: No   Sexual activity: Not Currently    Partners: Male    Birth control/protection: Surgical    Comment: hyst   Other Topics Concern   Not on file  Social History Narrative   Currently unable to work   Lives in [place]   Married   Patient drinks 1 cup of caffeine daily.   Patient is right handed.    Joined the [employer] to get more exercise   Social Determinants of Health   Financial Resource Strain: Low Risk  (03/29/2022)   Overall  Financial Resource Strain (CARDIA)    Difficulty of Paying Living Expenses: Not hard at all  Food Insecurity: No Food Insecurity (03/29/2022)   Hunger Vital Sign    Worried About Running Out of Food in the Last Year: Never true    Ran Out of Food in the Last Year: Never true  Transportation Needs: No Transportation Needs (03/29/2022)   PRAPARE - Hydrologist (Medical): No    Lack of Transportation (Non-Medical): No  Physical Activity: Inactive (03/29/2022)   Exercise Vital Sign    Days of Exercise per Week: 0 days    Minutes of Exercise per Session: 0 min  Stress: Stress Concern Present (03/29/2022)   [place]    Feeling of Stress : To some extent  Social Connections: Moderately Isolated (03/29/2022)   Social Connection and Isolation Panel [NHANES]    Frequency of Communication with Friends and Family: Twice a week    Frequency of Social Gatherings with Friends and Family: Once a week    Attends Religious Services: Never    Marine scientist or Organizations: No    Attends Archivist Meetings: Never    Marital Status: Married    Allergies:  Allergies  Allergen Reactions   Morphine And Related Hives   Promethazine Hcl Other (See Comments)    Causes patient to become Hyper    Metabolic Disorder Labs: Lab Results  Component Value Date   HGBA1C 5.8 (H) 08/25/2022   MPG 117 (H) 01/06/2015   MPG 117 (H) 02/17/2014   No results found for: "PROLACTIN" Lab Results  Component Value Date   CHOL 138 08/25/2022   TRIG 133 08/25/2022   HDL 63 08/25/2022   CHOLHDL 2.2 08/25/2022   VLDL 26 09/16/2015   LDLCALC 52 08/25/2022   LDLCALC 98 05/12/2021   Lab Results  Component Value Date   TSH 1.720 08/25/2022   TSH 2.430 05/12/2021    Therapeutic Level Labs: No results found for: "LITHIUM" No results found for: "VALPROATE" No results found for: "CBMZ"  Current  Medications: Current Outpatient Medications  Medication Sig Dispense Refill   methylphenidate (RITALIN) 20 MG  tablet Take 1 tablet (20 mg total) by mouth 2 (two) times daily with breakfast and lunch. 60 tablet 0   methylphenidate (RITALIN) 20 MG tablet Take 1 tablet (20 mg total) by mouth 2 (two) times daily with breakfast and lunch. 60 tablet 0   acetaminophen (TYLENOL) 500 MG tablet Take 650 mg by mouth daily at 6 (six) AM.     ALPRAZolam (XANAX) 1 MG tablet Take 1 tablet (1 mg total) by mouth 3 (three) times daily. 90 tablet 2   atorvastatin (LIPITOR) 40 MG tablet Take 1 tablet (40 mg total) by mouth daily. 90 tablet 3   Calcium 500-125 MG-UNIT TABS Take 1 tablet by mouth daily with supper.     Carboxymethylcellulose Sod PF 0.5 % SOLN Place 1 drop into both eyes daily as needed (dry eyes).     cyclobenzaprine (FLEXERIL) 5 MG tablet TAKE 1 TABLET BY MOUTH THREE TIMES DAILY AS NEEDED FOR MUSCLE SPASMS. 30 tablet 1   diclofenac Sodium (VOLTAREN) 1 % GEL Apply 1 application topically daily as needed (pain).     estradiol (ESTRACE) 0.1 MG/GM vaginal cream Place 0.5 g vaginally 2 (two) times a week. Place 0.5g nightly for two weeks then twice a week after 30 g 11   ezetimibe (ZETIA) 10 MG tablet Take 1 tablet (10 mg total) by mouth daily. 90 tablet 3   famotidine (PEPCID) 20 MG tablet Take 20 mg by mouth 2 (two) times daily. As needed     fexofenadine (ALLEGRA) 180 MG tablet Take 180 mg by mouth daily.     lidocaine-prilocaine (EMLA) cream Apply 1 Application topically at bedtime.     lisinopril (ZESTRIL) 20 MG tablet TAKE 1 TABLET BY MOUTH DAILY. 90 tablet 0   methylphenidate (RITALIN) 20 MG tablet Take 1 tablet (20 mg total) by mouth 2 (two) times daily with breakfast and lunch. 60 tablet 0   montelukast (SINGULAIR) 10 MG tablet TAKE (1) TABLET BY MOUTH AT BEDTIME. 90 tablet 0   Multiple Vitamin (MULITIVITAMIN WITH MINERALS) TABS Take 1 tablet by mouth daily with breakfast.     Nystatin POWD by  Does not apply route. As needed.     Olopatadine HCl 0.2 % SOLN Place 1 drop into both eyes in the morning and at bedtime.     Omega-3 Fatty Acids (FISH OIL) 1000 MG CAPS Take 2,000 mg by mouth.     omeprazole (PRILOSEC) 40 MG capsule Take 1 capsule (40 mg total) by mouth 2 (two) times daily. 60 capsule 5   OVER THE COUNTER MEDICATION Milk thistle once per day.  Mushrooms once daily.  Vit K 2 once per day.  Beet root gummies daily     oxybutynin (DITROPAN-XL) 10 MG 24 hr tablet TAKE (1) TABLET BY MOUTH AT BEDTIME 90 tablet 0   Probiotic Product (TRUBIOTICS PO) Take 1 capsule by mouth daily. Takes 25 cfu daily.     pyridOXINE (VITAMIN B-6) 100 MG tablet Take 100 mg by mouth daily.     traZODone (DESYREL) 100 MG tablet TAKE (2) TABLETS BY MOUTH AT BEDTIME. 60 tablet 0   TYMLOS 3120 MCG/1.56ML SOPN      venlafaxine XR (EFFEXOR XR) 150 MG 24 hr capsule Take 1 capsule (150 mg total) by mouth daily with breakfast. 90 capsule 2   No current facility-administered medications for this visit.     Musculoskeletal: Strength & Muscle Tone: na Gait & Station: na Patient leans: N/A  Psychiatric Specialty Exam: Review of Systems  Gastrointestinal:  Positive for diarrhea.  Musculoskeletal:  Positive for arthralgias and back pain.  All other systems reviewed and are negative.   There were no vitals taken for this visit.There is no height or weight on file to calculate BMI.  General Appearance: NA  Eye Contact:  NA  Speech:  Clear and Coherent  Volume:  Normal  Mood:  Euthymic  Affect:  NA  Thought Process:  Goal Directed  Orientation:  Full (Time, Place, and Person)  Thought Content: Rumination   Suicidal Thoughts:  No  Homicidal Thoughts:  No  Memory:  Immediate;   Good Recent;   Fair Remote;   Fair  Judgement:  Good  Insight:  Fair  Psychomotor Activity:  Decreased  Concentration:  Concentration: Good and Attention Span: Good  Recall:  Good  Fund of Knowledge: Good  Language:  Good  Akathisia:  No  Handed:  Right  AIMS (if indicated): not done  Assets:  Communication Skills Desire for Improvement Resilience Social Support  ADL's:  Intact  Cognition: WNL  Sleep:  Good   Screenings: AUDIT    Flowsheet Row Clinical Support from 01/06/2021 in [facility]  Alcohol Use Disorder Identification Test Final Score (AUDIT) 4      GAD-7    Flowsheet Row Office Visit from 08/29/2022 in [facility]  Total GAD-7 Score 6      MDI    [place] Office Visit from 01/22/2016 in [place] at [facility]  Total Score (max 50) 34      Mini-Mental    Flowsheet Row Office Visit from 02/13/2015 in [facility]  Total Score (max 30 points ) 26      PHQ2-9    [place] Visit from 08/29/2022 in [facility] Office Visit from 05/09/2022 in [facility] Clinical Support from 03/29/2022 in [facility] Video Visit from 02/15/2022 in [place] at [place] Video Visit from 11/22/2021 in [place] at [facility] Total Score '2 1 1 '$ 0 2  PHQ-9 Total Score 5 -- -- -- 9      SBQ-R    [place] Office Visit from 01/22/2016 in [place] at [place] Total Score 14.1      [place] [facility] from 04/15/2022 in [facility] Video Visit from 02/15/2022 in [place] at [place] Video Visit from 11/22/2021 in [place] at [place] No Risk No Risk Error: Q3, 4, or 5 should not be populated when Q2 is No        Assessment and Plan: This patient is a 62 year old female with a history depression anxiety and problems with focus and energy.  Despite her chronic pain  she is doing well on her current regimen.  She will continue methylphenidate 20 mg twice daily for ADD, Effexor XR 150 mg daily for depression, trazodone 200 mg at bedtime for sleep and Xanax 1 mg 3 times daily for anxiety.  She will return to see me in 3 months  Collaboration of Care: Collaboration of Care: Primary Care Provider AEB notes are shared with PCP on the epic system  Patient/Guardian was advised Release of Information must be obtained prior to any record release in order to collaborate their care with an  outside provider. Patient/Guardian was advised if they have not already done so to contact the registration department to sign all necessary forms in order for [place] to release information regarding their care.   Consent: Patient/Guardian gives verbal consent for treatment and assignment of benefits for services provided during this visit. Patient/Guardian expressed understanding and agreed to proceed.    Diamond Spiller, MD 09/02/2022, 10:44 AM

## 2022-09-03 ENCOUNTER — Encounter: Payer: Self-pay | Admitting: Internal Medicine

## 2022-09-05 DIAGNOSIS — M810 Age-related osteoporosis without current pathological fracture: Secondary | ICD-10-CM | POA: Diagnosis not present

## 2022-09-06 ENCOUNTER — Other Ambulatory Visit: Payer: Self-pay | Admitting: Family

## 2022-09-06 ENCOUNTER — Encounter: Payer: Self-pay | Admitting: Orthopaedic Surgery

## 2022-09-06 ENCOUNTER — Other Ambulatory Visit: Payer: Self-pay

## 2022-09-06 MED ORDER — MONTELUKAST SODIUM 10 MG PO TABS: ORAL_TABLET | ORAL | 0 refills | Status: DC

## 2022-09-07 ENCOUNTER — Encounter: Payer: Self-pay | Admitting: Internal Medicine

## 2022-09-07 ENCOUNTER — Ambulatory Visit: Payer: PPO | Admitting: Internal Medicine

## 2022-09-08 ENCOUNTER — Encounter: Payer: Self-pay | Admitting: Orthopaedic Surgery

## 2022-09-08 DIAGNOSIS — S32019D Unspecified fracture of first lumbar vertebra, subsequent encounter for fracture with routine healing: Secondary | ICD-10-CM | POA: Diagnosis not present

## 2022-09-08 DIAGNOSIS — M6281 Muscle weakness (generalized): Secondary | ICD-10-CM | POA: Diagnosis not present

## 2022-09-08 DIAGNOSIS — M545 Low back pain, unspecified: Secondary | ICD-10-CM | POA: Diagnosis not present

## 2022-09-09 NOTE — Telephone Encounter (Signed)
noted 

## 2022-09-11 ENCOUNTER — Encounter: Payer: Self-pay | Admitting: Orthopedic Surgery

## 2022-09-14 ENCOUNTER — Encounter: Payer: Self-pay | Admitting: Orthopaedic Surgery

## 2022-09-14 DIAGNOSIS — M6281 Muscle weakness (generalized): Secondary | ICD-10-CM | POA: Diagnosis not present

## 2022-09-14 DIAGNOSIS — M545 Low back pain, unspecified: Secondary | ICD-10-CM | POA: Diagnosis not present

## 2022-09-14 DIAGNOSIS — S32019D Unspecified fracture of first lumbar vertebra, subsequent encounter for fracture with routine healing: Secondary | ICD-10-CM | POA: Diagnosis not present

## 2022-09-16 ENCOUNTER — Encounter: Payer: Self-pay | Admitting: Internal Medicine

## 2022-09-18 ENCOUNTER — Other Ambulatory Visit: Payer: Self-pay | Admitting: Internal Medicine

## 2022-09-18 DIAGNOSIS — M62838 Other muscle spasm: Secondary | ICD-10-CM

## 2022-09-19 ENCOUNTER — Ambulatory Visit (INDEPENDENT_AMBULATORY_CARE_PROVIDER_SITE_OTHER): Payer: PPO | Admitting: Gastroenterology

## 2022-09-30 ENCOUNTER — Encounter: Payer: Self-pay | Admitting: Internal Medicine

## 2022-10-03 ENCOUNTER — Other Ambulatory Visit: Payer: Self-pay | Admitting: Internal Medicine

## 2022-10-03 ENCOUNTER — Other Ambulatory Visit: Payer: Self-pay

## 2022-10-03 ENCOUNTER — Encounter: Payer: Self-pay | Admitting: Orthopedic Surgery

## 2022-10-03 DIAGNOSIS — M6281 Muscle weakness (generalized): Secondary | ICD-10-CM | POA: Diagnosis not present

## 2022-10-03 DIAGNOSIS — M62838 Other muscle spasm: Secondary | ICD-10-CM

## 2022-10-03 DIAGNOSIS — S32019D Unspecified fracture of first lumbar vertebra, subsequent encounter for fracture with routine healing: Secondary | ICD-10-CM | POA: Diagnosis not present

## 2022-10-03 DIAGNOSIS — I10 Essential (primary) hypertension: Secondary | ICD-10-CM

## 2022-10-03 MED ORDER — LISINOPRIL 20 MG PO TABS: 20.0000 mg | ORAL_TABLET | Freq: Every day | ORAL | 0 refills | Status: DC

## 2022-10-04 ENCOUNTER — Encounter (INDEPENDENT_AMBULATORY_CARE_PROVIDER_SITE_OTHER): Payer: Self-pay | Admitting: Gastroenterology

## 2022-10-05 ENCOUNTER — Encounter: Payer: Self-pay | Admitting: Orthopaedic Surgery

## 2022-10-05 ENCOUNTER — Other Ambulatory Visit: Payer: Self-pay

## 2022-10-05 ENCOUNTER — Other Ambulatory Visit: Payer: Self-pay | Admitting: Internal Medicine

## 2022-10-05 DIAGNOSIS — M62838 Other muscle spasm: Secondary | ICD-10-CM

## 2022-10-05 MED ORDER — CYCLOBENZAPRINE HCL 5 MG PO TABS: 5.0000 mg | ORAL_TABLET | Freq: Three times a day (TID) | ORAL | 0 refills | Status: DC | PRN

## 2022-10-06 ENCOUNTER — Encounter: Payer: Self-pay | Admitting: Orthopedic Surgery

## 2022-10-06 ENCOUNTER — Other Ambulatory Visit: Payer: Self-pay

## 2022-10-06 ENCOUNTER — Other Ambulatory Visit (INDEPENDENT_AMBULATORY_CARE_PROVIDER_SITE_OTHER): Payer: PPO

## 2022-10-06 ENCOUNTER — Ambulatory Visit (INDEPENDENT_AMBULATORY_CARE_PROVIDER_SITE_OTHER): Payer: PPO | Admitting: Orthopedic Surgery

## 2022-10-06 DIAGNOSIS — G8929 Other chronic pain: Secondary | ICD-10-CM

## 2022-10-06 DIAGNOSIS — Z9889 Other specified postprocedural states: Secondary | ICD-10-CM

## 2022-10-06 DIAGNOSIS — M25561 Pain in right knee: Secondary | ICD-10-CM

## 2022-10-06 NOTE — Progress Notes (Addendum)
Chief Complaint  Patient presents with   Knee Pain    Right/ asking for right knee xray  states went to physical therapy and then was diagnosed with osteoporosis pain down entire right leg     HPI: 62 year old female had previous knee arthroscopy see below  Comes in with right knee pain after some type of knee extension exercise with an open Chain technique which caused her to have severe pain in the anterior knee and medial knee radiating down her leg.   procedure arthroscopy right knee partial medial meniscectomy and chondroplasty of the [id number]   Past Medical History:  Diagnosis Date   Allergy    grass, dust , mold   Anxiety    Arthritis    Asthma due to seasonal allergies 06/15/2020   Bipolar disorder    Carpal tunnel syndrome    Bilateral   Chest pain 09/2011   Cardiac cath-normal coronaries   Constipation    Depression    Difficulty urinating 05/31/2013   Elevated LFTs 12/16/2013   Encounter for general adult medical examination with abnormal findings 07/07/2021   GERD (gastroesophageal reflux disease)    History of kidney stones    Hyperlipemia    Hyperlipidemia    Hypertension    Mild; provoked by stress and anxiety   IBS (irritable bowel syndrome)    Intracerebral bleed    No aneurysm; followed by Dr. Carolan Shiver replaced    "lense transplant" 2022; pt states lense don't dilate or constrict   Loss of weight 01/06/2015   Osteoporosis    Stage 3a chronic kidney disease 03/16/2021   Stroke 1999   hemorrhagic stroke; weakness of left side    There were no vitals taken for this visit.   General appearance: Well-developed well-nourished no gross deformities  Cardiovascular normal pulse and perfusion normal color without edema  Neurologically no sensation loss or deficits or pathologic reflexes  Psychological: Awake alert and oriented x3 mood and affect normal  Skin no lacerations or ulcerations no nodularity no palpable masses, no erythema or  nodularity  Musculoskeletal: Patellar compression reproduces pain behind the kneecap otherwise full range of motion no effusion ligaments are stable muscle tone is normal  Imaging normal x-rays in the office today  A/P  Chondromalacia of the patella aggravated by open chain exercises  Recommend close chain exercises and quad sets for knee strengthening  Procedure note right knee injection   verbal consent was obtained to inject right knee joint  Timeout was completed to confirm the site of injection  The medications used were depomedrol 40 mg and 1% lidocaine 3 cc Anesthesia was provided by ethyl chloride and the skin was prepped with alcohol.  After cleaning the skin with alcohol a 20-gauge needle was used to inject the right knee joint. There were no complications. A sterile bandage was applied.     Fu prn

## 2022-10-07 ENCOUNTER — Other Ambulatory Visit: Payer: Self-pay | Admitting: Radiology

## 2022-10-07 DIAGNOSIS — G8929 Other chronic pain: Secondary | ICD-10-CM

## 2022-10-08 ENCOUNTER — Encounter: Payer: Self-pay | Admitting: Orthopaedic Surgery

## 2022-10-08 ENCOUNTER — Encounter: Payer: Self-pay | Admitting: Orthopedic Surgery

## 2022-10-10 ENCOUNTER — Telehealth: Payer: Self-pay

## 2022-10-10 NOTE — Telephone Encounter (Signed)
-----   Message from Caffie Damme, RT sent at 10/07/2022  1:38 PM EDT ----- Hyaluronic acid injection right knee/ prefer single injection on this patient if possible please

## 2022-10-10 NOTE — Telephone Encounter (Signed)
VOB submitted for Monovisc, right knee  

## 2022-10-12 ENCOUNTER — Encounter: Payer: Self-pay | Admitting: Internal Medicine

## 2022-10-12 ENCOUNTER — Encounter: Payer: Self-pay | Admitting: Orthopedic Surgery

## 2022-10-12 ENCOUNTER — Encounter (INDEPENDENT_AMBULATORY_CARE_PROVIDER_SITE_OTHER): Payer: Self-pay

## 2022-10-13 ENCOUNTER — Encounter: Payer: Self-pay | Admitting: Orthopedic Surgery

## 2022-10-13 ENCOUNTER — Other Ambulatory Visit: Payer: Self-pay | Admitting: Internal Medicine

## 2022-10-13 ENCOUNTER — Telehealth (INDEPENDENT_AMBULATORY_CARE_PROVIDER_SITE_OTHER): Payer: PPO | Admitting: Gastroenterology

## 2022-10-13 DIAGNOSIS — M62838 Other muscle spasm: Secondary | ICD-10-CM

## 2022-10-14 ENCOUNTER — Encounter: Payer: Self-pay | Admitting: Orthopaedic Surgery

## 2022-10-14 ENCOUNTER — Other Ambulatory Visit: Payer: Self-pay | Admitting: Internal Medicine

## 2022-10-14 ENCOUNTER — Encounter: Payer: Self-pay | Admitting: Internal Medicine

## 2022-10-14 DIAGNOSIS — M62838 Other muscle spasm: Secondary | ICD-10-CM

## 2022-10-14 MED ORDER — CYCLOBENZAPRINE HCL 5 MG PO TABS
5.0000 mg | ORAL_TABLET | Freq: Three times a day (TID) | ORAL | 2 refills | Status: DC | PRN
Start: 2022-10-14 — End: 2023-01-16

## 2022-10-15 ENCOUNTER — Encounter: Payer: Self-pay | Admitting: Orthopedic Surgery

## 2022-10-16 ENCOUNTER — Encounter: Payer: Self-pay | Admitting: Orthopaedic Surgery

## 2022-10-16 ENCOUNTER — Other Ambulatory Visit: Payer: Self-pay | Admitting: Internal Medicine

## 2022-10-16 ENCOUNTER — Encounter: Payer: Self-pay | Admitting: Orthopedic Surgery

## 2022-10-16 DIAGNOSIS — K219 Gastro-esophageal reflux disease without esophagitis: Secondary | ICD-10-CM

## 2022-10-17 ENCOUNTER — Telehealth (INDEPENDENT_AMBULATORY_CARE_PROVIDER_SITE_OTHER): Payer: PPO | Admitting: Gastroenterology

## 2022-10-17 ENCOUNTER — Encounter (INDEPENDENT_AMBULATORY_CARE_PROVIDER_SITE_OTHER): Payer: Self-pay | Admitting: Gastroenterology

## 2022-10-17 DIAGNOSIS — K582 Mixed irritable bowel syndrome: Secondary | ICD-10-CM | POA: Diagnosis not present

## 2022-10-17 NOTE — Patient Instructions (Signed)
-  Will continue with current benefiber regimen -Increase water intake, aim for atleast 64 oz per day -Increase fruits, veggies and whole grains, kiwi and prunes are especially good for constipation  Follow up 6 months

## 2022-10-17 NOTE — Progress Notes (Signed)
Primary Care Physician:  Anabel Halon, MD  Primary GI: Levon Hedger   Patient Location: Home   Provider Location: [facility]   Reason for Visit:    Persons present on the virtual encounter, with roles: Arth Nicastro L. Jeanmarie Hubert, MSN, APRN, AGNP-C, Remedy Barbuto patient    Total time (minutes) spent on medical discussion: 5 minutes  Virtual Visit via Telephone visit is conducted virtually and was requested by patient.   I connected with [name] on 10/17/22 at  8:15 AM EDT by telephone and verified that I am speaking with the correct person using two identifiers.   I discussed the limitations, risks, security and privacy concerns of performing an evaluation and management service by telephone and the availability of in person appointments. I also discussed with the patient that there may be a patient responsible charge related to this service. The patient expressed understanding and agreed to proceed.  Chief Complaint  Patient presents with   nash    Follow up visit. Patient was on schedule for consult but told me she was thought visit was just a follow up. She reports she is doing better with IBS and has no concerns.    History of Present Illness: [name] is a 62 y.o. female with past medical history of bipolar disorder, cervical cancer, depression, hyperlipidemia, hypertension, IBS, stroke, SIMO, CKD stage III and NASH, who presents for follow up of IBS.    At last visit in February, h/o complaints of recurrent bloating and discomfort in her abdomen.  A referral was sent for SIBO testing. taking Benefiber 2 teas spoons and Miralax 1 capful daily compliantly. She has been presenting increased flatulence with foul smell. Has tried to eat prunes regularly. Feels she has been having intermittent cramping in the L flank and periumbilical area.   Recommended to proceed with SIBO testing, continue benefiber daily, titrate miralax and continue with daily prunes    Present:  Reports her stomach is feeling much better. Using more benefiber as miralax causes her diarrhea. She uses this only on rare occasion if she goes a day without a BM. She is doing 3t of benefiber daily, having a BM daily. Denies abdominal pain. Has abdominal cramping with occasional diarrhea which she has not had recently. She reports she did not do SIBO breath testing. No rectal bleeding, melena, nausea, or vomiting. Appetite is good.   Last EGD: 04/16/2021, no alterations were found in the esophagus, this was empirically dilated with a Savary dilator up to 18 mm.  Biopsies from esophagus were within normal limits.  There were a few erosions in the stomach.  Biopsies were negative for H. pylori.  A 12 mm polyp was snared from the gastric fundus which showed fundic gland polyp with low-grade dysplasia.  Duodenum was normal with biopsies negative for any adenopathies.  Last Colonoscopy: 08/2018 -performed by Dr. [name].  Per report within normal limits, had biopsies of the right and left colon which were within normal limits. Last EUS 2017 - The pancreatic parenchyma was normal as well as the PD caliber. The PD was very small and difficult to measure. The CBD was normal in size (4.3 mm) and there was no evidence of any stones. No gallbladder was visualized, which is expected in her post cholecystectomy state. No other abnormalities were identified.  Past Medical History:  Diagnosis Date   Allergy    grass, dust , mold   Anxiety    Arthritis  Asthma due to seasonal allergies 06/15/2020   Bipolar disorder    Carpal tunnel syndrome    Bilateral   Chest pain 09/2011   Cardiac cath-normal coronaries   Constipation    Depression    Difficulty urinating 05/31/2013   Elevated LFTs 12/16/2013   Encounter for general adult medical examination with abnormal findings 07/07/2021   GERD (gastroesophageal reflux disease)    History of kidney stones    Hyperlipemia    Hyperlipidemia     Hypertension    Mild; provoked by stress and anxiety   IBS (irritable bowel syndrome)    Intracerebral bleed    No aneurysm; followed by Dr. Athena Masse replaced    "lense transplant" 2022; pt states lense don't dilate or constrict   Loss of weight 01/06/2015   Osteoporosis    Stage 3a chronic kidney disease 03/16/2021   Stroke 1999   hemorrhagic stroke; weakness of left side     Past Surgical History:  Procedure Laterality Date   BIOPSY  04/16/2021   Procedure: BIOPSY;  Surgeon: Marguerita Merles, Reuel Boom, MD;  Location: [facility];  Service: Gastroenterology;;  small, bowel, esophageal(proximal and distal);   BRAIN SURGERY  1999   to remove blood clot after stroke    CARDIAC CATHETERIZATION  2016   CERVICAL FUSION     CHOLECYSTECTOMY N/A 10/14/2014   Procedure: LAPAROSCOPIC CHOLECYSTECTOMY WITH INTRAOPERATIVE CHOLANGIOGRAM;  Surgeon: Avel Peace, MD;  Location: [facility] OR;  Service: General;  Laterality: N/A;   CHONDROPLASTY Right 07/13/2017   Procedure: CHONDROPLASTY of patella;  Surgeon: Vickki Hearing, MD;  Location: [facility];  Service: Orthopedics;  Laterality: Right;   ESOPHAGEAL DILATION N/A 04/16/2021   Procedure: ESOPHAGEAL DILATION;  Surgeon: Dolores Frame, MD;  Location: [facility];  Service: Gastroenterology;  Laterality: N/A;   ESOPHAGOGASTRODUODENOSCOPY (EGD) WITH PROPOFOL N/A 04/16/2021   Procedure: ESOPHAGOGASTRODUODENOSCOPY (EGD) WITH PROPOFOL;  Surgeon: Dolores Frame, MD;  [facility]: [facility];  Service: Gastroenterology;  Laterality: N/A;  1:35, pt knows to arrive at 9:45   EUS N/A 08/21/2015   Procedure: ESOPHAGEAL ENDOSCOPIC ULTRASOUND (EUS) RADIAL;  Surgeon: [name], MD;  Location: [facility];  Service: Endoscopy;  Laterality: N/A;   KNEE ARTHROSCOPY WITH MEDIAL MENISECTOMY Right 07/13/2017   Procedure: KNEE ARTHROSCOPY WITH PARTIAL MEDIAL MENISECTOMY;  Surgeon: Vickki Hearing, MD;  Location: [facility];   Service: Orthopedics;  Laterality: Right;   LEFT HEART CATHETERIZATION WITH CORONARY ANGIOGRAM N/A 09/23/2011   Procedure: LEFT HEART CATHETERIZATION WITH CORONARY ANGIOGRAM;  Surgeon: Vesta Mixer, MD;  Location[facility];  Service: Cardiovascular;  Laterality: N/A;   LIPOMA EXCISION Left 11/18/2013   Procedure: EXCISION OF SOFT TISSUE MASS-LEFT THIGH;  Surgeon: Dalia Heading, MD;  Location: [facility];  Service: General;  Laterality: Left;   NASAL SEPTOPLASTY W/ TURBINOPLASTY Bilateral 08/30/2021   Procedure: NASAL SEPTOPLASTY WITH BILATERAL TURBINATE REDUCTION;  Surgeon: Newman Pies, MD;  Location: [facility];  Service: ENT;  Laterality: Bilateral;   POLYPECTOMY  04/16/2021   Procedure: POLYPECTOMY;  Surgeon: Dolores Frame, MD;  [facility];  Service: Gastroenterology;;  gastric   RECTOCELE REPAIR     x2   RECTOCELE REPAIR N/A 04/04/2017   Procedure: POSTERIOR REPAIR (RECTOCELE);  Surgeon: Tilda Burrow, MD;  Location: [facility];  Service: Gynecology;  Laterality: N/A;   TOTAL ABDOMINAL HYSTERECTOMY       Current Meds  Medication Sig   acetaminophen (TYLENOL) 500 MG  tablet Take 650 mg by mouth daily at 6 (six) AM.   ALPRAZolam (XANAX) 1 MG tablet Take 1 tablet (1 mg total) by mouth 3 (three) times daily.   atorvastatin (LIPITOR) 40 MG tablet Take 1 tablet (40 mg total) by mouth daily.   Calcium 500-125 MG-UNIT TABS Take 1 tablet by mouth daily with supper.   Carboxymethylcellulose Sod PF 0.5 % SOLN Place 1 drop into both eyes daily as needed (dry eyes).   cetirizine (ZYRTEC) 10 MG tablet Take 10 mg by mouth daily.   cyclobenzaprine (FLEXERIL) 5 MG tablet Take 1 tablet (5 mg total) by mouth 3 (three) times daily as needed. for muscle spams   diclofenac Sodium (VOLTAREN) 1 % GEL Apply 1 application topically daily as needed (pain).   estradiol (ESTRACE) 0.1 MG/GM vaginal cream Place 0.5 g vaginally 2 (two) times a week. Place 0.5g nightly for two  weeks then twice a week after   ezetimibe (ZETIA) 10 MG tablet Take 1 tablet (10 mg total) by mouth daily.   famotidine (PEPCID) 20 MG tablet Take 20 mg by mouth 2 (two) times daily. As needed   lidocaine-prilocaine (EMLA) cream Apply 1 Application topically at bedtime.   lisinopril (ZESTRIL) 20 MG tablet Take 1 tablet (20 mg total) by mouth daily.   methylphenidate (RITALIN) 20 MG tablet Take 1 tablet (20 mg total) by mouth 2 (two) times daily with breakfast and lunch.   methylphenidate (RITALIN) 20 MG tablet Take 1 tablet (20 mg total) by mouth 2 (two) times daily with breakfast and lunch.   methylphenidate (RITALIN) 20 MG tablet Take 1 tablet (20 mg total) by mouth 2 (two) times daily with breakfast and lunch.   montelukast (SINGULAIR) 10 MG tablet TAKE (1) TABLET BY MOUTH AT BEDTIME.   Multiple Vitamin (MULITIVITAMIN WITH MINERALS) TABS Take 1 tablet by mouth daily with breakfast.   Nystatin POWD by Does not apply route. As needed.   Olopatadine HCl 0.2 % SOLN Place 1 drop into both eyes in the morning and at bedtime.   Omega-3 Fatty Acids (FISH OIL) 1000 MG CAPS Take 2,000 mg by mouth.   omeprazole (PRILOSEC) 40 MG capsule Take 1 capsule (40 mg total) by mouth 2 (two) times daily.   OVER THE COUNTER MEDICATION Milk thistle once per day.  Mushrooms once daily.  Vit K 2 once per day.  Beet root gummies daily   oxybutynin (DITROPAN-XL) 10 MG 24 hr tablet TAKE (1) TABLET BY MOUTH AT BEDTIME   Probiotic Product (TRUBIOTICS PO) Take 1 capsule by mouth daily. Takes 25 cfu daily.   pyridOXINE (VITAMIN B-6) 100 MG tablet Take 100 mg by mouth daily.   traZODone (DESYREL) 100 MG tablet TAKE (2) TABLETS BY MOUTH AT BEDTIME.   TYMLOS 3120 MCG/1.56ML SOPN One qhs   venlafaxine XR (EFFEXOR XR) 150 MG 24 hr capsule Take 1 capsule (150 mg total) by mouth daily with breakfast.   [DISCONTINUED] fexofenadine (ALLEGRA) 180 MG tablet Take 180 mg by mouth daily.     Family History  Problem Relation Age  of Onset   Cancer Mother        breast    Hypertension Mother    Hyperlipidemia Mother    Depression Mother    Anxiety disorder Mother    COPD Mother    Arthritis Mother        rheumatoid   Drug abuse Sister    Coronary artery disease Paternal Grandfather    Coronary artery disease Paternal  Uncle    Depression Cousin    Drug abuse Cousin     Social History   Socioeconomic History   Marital status: Married    Spouse name: Dorene Sorrow   Number of children: 0   Years of education: HS   Highest education level: Not on file  Occupational History   Occupation: unemployed    Comment: pending disability  Tobacco Use   Smoking status: Former    Packs/day: 1.00    Years: 19.00    Additional pack years: 0.00    Total pack years: 19.00    Types: Cigarettes    Quit date: 09/01/1997    Years since quitting: 25.1    Passive exposure: Past   Smokeless tobacco: Never   Tobacco comments:    Quit smoking 1999 , previous 20 pack years  Vaping Use   Vaping Use: Never used  Substance and Sexual Activity   Alcohol use: Yes    Comment: 1 drink every other week   Drug use: No   Sexual activity: Not Currently    Partners: Male    Birth control/protection: Surgical    Comment: hyst   Other Topics Concern   Not on file  Social History Narrative   Currently unable to work   Lives in [place]   Married   Patient drinks 1 cup of caffeine daily.   Patient is right handed.    Joined the [employer] to get more exercise   Social Determinants of Health   Financial Resource Strain: Low Risk  (03/29/2022)   Overall Financial Resource Strain (CARDIA)    Difficulty of Paying Living Expenses: Not hard at all  Food Insecurity: No Food Insecurity (03/29/2022)   Hunger Vital Sign    Worried About Running Out of Food in the Last Year: Never true    Ran Out of Food in the Last Year: Never true  Transportation Needs: No Transportation Needs (03/29/2022)   PRAPARE - Administrator, Civil Service  (Medical): No    Lack of Transportation (Non-Medical): No  Physical Activity: Inactive (03/29/2022)   Exercise Vital Sign    Days of Exercise per Week: 0 days    Minutes of Exercise per Session: 0 min  Stress: Stress Concern Present (03/29/2022)   [employer] of Occupational Health - Occupational Stress Questionnaire    Feeling of Stress : To some extent  Social Connections: Moderately Isolated (03/29/2022)   Social Connection and Isolation Panel [NHANES]    Frequency of Communication with Friends and Family: Twice a week    Frequency of Social Gatherings with Friends and Family: Once a week    Attends Religious Services: Never    Database administrator or Organizations: No    Attends Engineer, structural: Never    Marital Status: Married   Review of Systems: Gen: Denies fever, chills, anorexia. Denies fatigue, weakness, weight loss.  CV: Denies chest pain, palpitations, syncope, peripheral edema, and claudication. Resp: Denies dyspnea at rest, cough, wheezing, coughing up blood, and pleurisy. GI: see HPI Derm: Denies rash, itching, dry skin Psych: Denies depression, anxiety, memory loss, confusion. No homicidal or suicidal ideation.  Heme: Denies bruising, bleeding, and enlarged lymph nodes.  Observations/Objective: No distress. Unable to perform physical exam due to telephone encounter. No video available.   Assessment and Plan: [name] is a 62 y.o. female with past medical history of bipolar disorder, cervical cancer, depression, hyperlipidemia, hypertension, IBS, stroke, SIMO, CKD stage III and NASH,  who presents for follow up of IBS.    IBS is doing well currently, with benefiber daily. Having a BM usually every day, using miralax very rarely. Abdominal pain and cramping are also very rare. She has no complaints today. No red flag symptoms. Patient denies melena, hematochezia, nausea, vomiting, diarrhea, constipation, dysphagia, odyonophagia, early satiety  or weight loss.   -Will continue with current benefiber regimen -Increase water intake, aim for atleast 64 oz per day -Increase fruits, veggies and whole grains, kiwi and prunes are especially good for constipation   Follow Up Instructions: 6 months    I discussed the assessment and treatment plan with the patient. The patient was provided an opportunity to ask questions and all were answered. The patient agreed with the plan and demonstrated an understanding of the instructions.   The patient was advised to call back or seek an in-person evaluation if the symptoms worsen or if the condition fails to improve as anticipated.  I provided 5 minutes of NON face-to-face time during this Telephone encounter.  Jyl Chico L. Jeanmarie Hubert, MSN, APRN, AGNP-C Adult-Gerontology Nurse Practitioner [facility]  I have reviewed the note and agree with the APP's assessment as described in this progress note  Katrinka Blazing, MD Gastroenterology and Hepatology [facility] Gastroenterology

## 2022-10-18 ENCOUNTER — Encounter: Payer: Self-pay | Admitting: Orthopedic Surgery

## 2022-10-18 ENCOUNTER — Encounter: Payer: Self-pay | Admitting: Orthopaedic Surgery

## 2022-10-20 DIAGNOSIS — S32019D Unspecified fracture of first lumbar vertebra, subsequent encounter for fracture with routine healing: Secondary | ICD-10-CM | POA: Diagnosis not present

## 2022-10-20 DIAGNOSIS — M6281 Muscle weakness (generalized): Secondary | ICD-10-CM | POA: Diagnosis not present

## 2022-10-20 DIAGNOSIS — M545 Low back pain, unspecified: Secondary | ICD-10-CM | POA: Diagnosis not present

## 2022-10-23 ENCOUNTER — Encounter (INDEPENDENT_AMBULATORY_CARE_PROVIDER_SITE_OTHER): Payer: Self-pay | Admitting: Gastroenterology

## 2022-10-24 ENCOUNTER — Other Ambulatory Visit (INDEPENDENT_AMBULATORY_CARE_PROVIDER_SITE_OTHER): Payer: PPO

## 2022-10-24 ENCOUNTER — Ambulatory Visit (INDEPENDENT_AMBULATORY_CARE_PROVIDER_SITE_OTHER): Payer: PPO | Admitting: Orthopedic Surgery

## 2022-10-24 ENCOUNTER — Telehealth: Payer: Self-pay

## 2022-10-24 VITALS — BP 122/67 | HR 76 | Ht 63.0 in | Wt 134.0 lb

## 2022-10-24 DIAGNOSIS — M25571 Pain in right ankle and joints of right foot: Secondary | ICD-10-CM

## 2022-10-24 NOTE — Progress Notes (Signed)
Chief Complaint  Patient presents with   Ankle Pain    Right - no injury started hurting when knee was bothering her feels like she is over compensating   62 year old female with chondromalacia of the right knee, I rechecked her 2019 knee scope she had patella chondroplasty for large central ridge fissure, think she may be overcompensating for her right knee pain giving her some lateral foot and ankle pain without any ankle trauma  Her knee pain began when she put 2 pound weight on her right foot and try to do a knee extension.  No other trauma.  Today her ankle is stable she was tender along the peroneal tendons.  There is no reason to think she has a tear  Inconclusive as to why she has pain there could be from compensating from the knee but I doubt it  We have tried to get Orthovisc for her knee we do not have any other treatment options for her other than the injections we gave her and the arthritis medication that she is on  Awaiting Orthovisc or hyaluronic acid approval

## 2022-10-24 NOTE — Telephone Encounter (Signed)
Please precert patient for HA injection RT knee

## 2022-10-25 ENCOUNTER — Other Ambulatory Visit (HOSPITAL_COMMUNITY): Payer: Self-pay | Admitting: Psychiatry

## 2022-10-25 ENCOUNTER — Encounter: Payer: Self-pay | Admitting: Orthopaedic Surgery

## 2022-10-25 DIAGNOSIS — H35432 Paving stone degeneration of retina, left eye: Secondary | ICD-10-CM | POA: Diagnosis not present

## 2022-10-25 DIAGNOSIS — Z961 Presence of intraocular lens: Secondary | ICD-10-CM | POA: Diagnosis not present

## 2022-10-25 DIAGNOSIS — H43812 Vitreous degeneration, left eye: Secondary | ICD-10-CM | POA: Diagnosis not present

## 2022-10-26 ENCOUNTER — Telehealth: Payer: Self-pay

## 2022-10-26 NOTE — Telephone Encounter (Signed)
This has been completed.

## 2022-10-26 NOTE — Telephone Encounter (Signed)
Please schedule patient for gel injection.  All information has been noted under the referrals tab.  Thank you.

## 2022-10-27 ENCOUNTER — Encounter (INDEPENDENT_AMBULATORY_CARE_PROVIDER_SITE_OTHER): Payer: Self-pay | Admitting: Gastroenterology

## 2022-10-28 ENCOUNTER — Encounter (HOSPITAL_COMMUNITY): Payer: Self-pay

## 2022-10-30 ENCOUNTER — Other Ambulatory Visit: Payer: Self-pay | Admitting: Obstetrics and Gynecology

## 2022-10-30 DIAGNOSIS — N3941 Urge incontinence: Secondary | ICD-10-CM

## 2022-10-31 ENCOUNTER — Encounter (INDEPENDENT_AMBULATORY_CARE_PROVIDER_SITE_OTHER): Payer: Self-pay

## 2022-10-31 ENCOUNTER — Encounter (HOSPITAL_COMMUNITY): Payer: Self-pay

## 2022-11-01 ENCOUNTER — Ambulatory Visit (INDEPENDENT_AMBULATORY_CARE_PROVIDER_SITE_OTHER): Payer: PPO | Admitting: Gastroenterology

## 2022-11-01 ENCOUNTER — Encounter (INDEPENDENT_AMBULATORY_CARE_PROVIDER_SITE_OTHER): Payer: Self-pay | Admitting: Gastroenterology

## 2022-11-01 ENCOUNTER — Encounter: Payer: Self-pay | Admitting: Internal Medicine

## 2022-11-01 DIAGNOSIS — R197 Diarrhea, unspecified: Secondary | ICD-10-CM

## 2022-11-01 NOTE — Telephone Encounter (Signed)
Noted  

## 2022-11-01 NOTE — Progress Notes (Signed)
Patient was checked in my nursing staff, however, I attempted to reach patient via telephone x3 for her visit unsuccessfully, therefore virtual visit was not completed today.

## 2022-11-07 ENCOUNTER — Ambulatory Visit: Payer: PPO | Admitting: Orthopedic Surgery

## 2022-11-07 DIAGNOSIS — M1711 Unilateral primary osteoarthritis, right knee: Secondary | ICD-10-CM

## 2022-11-07 DIAGNOSIS — M25571 Pain in right ankle and joints of right foot: Secondary | ICD-10-CM

## 2022-11-07 DIAGNOSIS — Z9889 Other specified postprocedural states: Secondary | ICD-10-CM

## 2022-11-07 DIAGNOSIS — M2241 Chondromalacia patellae, right knee: Secondary | ICD-10-CM

## 2022-11-07 DIAGNOSIS — G8929 Other chronic pain: Secondary | ICD-10-CM

## 2022-11-07 MED ORDER — HYALURONAN 88 MG/4ML IX SOSY
88.0000 mg | PREFILLED_SYRINGE | Freq: Once | INTRA_ARTICULAR | Status: AC
  Administered 2022-11-07: 88 mg via INTRA_ARTICULAR

## 2022-11-07 NOTE — Patient Instructions (Addendum)
Call Dr [name] Carls to schedule appointment for your ankle/ foot pain Phone: [phone]

## 2022-11-07 NOTE — Progress Notes (Unsigned)
Appointment for Monovisc injection right knee  Encounter Diagnoses  Name Primary?   Pain in right ankle and joints of right foot Yes   Chronic pain of right knee    S/P right knee arthroscopy 07/13/17    Chondromalacia, patella, right    Primary osteoarthritis of right knee      This patient had arthroscopic surgery of the right knee she had some chondromalacia of her patella that is come back to bite her.  It seems to have started after some exercises done at the local [facility]  Images show arthritis of the joint  Cortisone as an anti-inflammatory is failed to provide relief  Patient is here for Monovisc injection  Consent and timeout were obtained to inject the right knee with Monovisc  This was done through a lateral approach with the knee flexed  Alcohol and ethyl chloride were used  No complication   Recheck in 6 weeks  Addendum the patient has been seen for right ankle pain medial side with some pain on the lateral side as well she is tender in this area it is over the posterior tibial tendon she already has orthotics  Recommend podiatry consult for posterior tibial tendon dysfunction

## 2022-11-08 ENCOUNTER — Ambulatory Visit (INDEPENDENT_AMBULATORY_CARE_PROVIDER_SITE_OTHER): Payer: PPO | Admitting: Internal Medicine

## 2022-11-08 ENCOUNTER — Encounter: Payer: Self-pay | Admitting: Orthopaedic Surgery

## 2022-11-08 ENCOUNTER — Encounter: Payer: Self-pay | Admitting: Internal Medicine

## 2022-11-08 ENCOUNTER — Telehealth: Payer: Self-pay | Admitting: Radiology

## 2022-11-08 VITALS — BP 121/77 | HR 83 | Ht 63.0 in | Wt 136.0 lb

## 2022-11-08 DIAGNOSIS — I10 Essential (primary) hypertension: Secondary | ICD-10-CM | POA: Diagnosis not present

## 2022-11-08 DIAGNOSIS — N1831 Chronic kidney disease, stage 3a: Secondary | ICD-10-CM

## 2022-11-08 DIAGNOSIS — R7303 Prediabetes: Secondary | ICD-10-CM

## 2022-11-08 DIAGNOSIS — Z803 Family history of malignant neoplasm of breast: Secondary | ICD-10-CM | POA: Diagnosis not present

## 2022-11-08 DIAGNOSIS — E782 Mixed hyperlipidemia: Secondary | ICD-10-CM | POA: Diagnosis not present

## 2022-11-08 DIAGNOSIS — M8000XS Age-related osteoporosis with current pathological fracture, unspecified site, sequela: Secondary | ICD-10-CM

## 2022-11-08 DIAGNOSIS — R5382 Chronic fatigue, unspecified: Secondary | ICD-10-CM | POA: Diagnosis not present

## 2022-11-08 DIAGNOSIS — N3941 Urge incontinence: Secondary | ICD-10-CM | POA: Diagnosis not present

## 2022-11-08 DIAGNOSIS — M5136 Other intervertebral disc degeneration, lumbar region: Secondary | ICD-10-CM | POA: Diagnosis not present

## 2022-11-08 DIAGNOSIS — M51369 Other intervertebral disc degeneration, lumbar region without mention of lumbar back pain or lower extremity pain: Secondary | ICD-10-CM

## 2022-11-08 MED ORDER — OXYBUTYNIN CHLORIDE ER 10 MG PO TB24: 10.0000 mg | ORAL_TABLET | Freq: Every day | ORAL | 1 refills | Status: DC

## 2022-11-08 NOTE — Telephone Encounter (Signed)
scheduled

## 2022-11-08 NOTE — Telephone Encounter (Signed)
-----   Message from [facility] May, RT sent at 11/08/2022  8:50 AM EDT ----- Can you burn a cd for me of her ankle?  And call patient advise when ready to pickup?   CD's and envelopes to your left.  ----- Message ----- From: Caffie Damme, RT Sent: 11/07/2022  12:07 PM EDT To: Cherre Huger, RT  She is calling for new patient appointment, likely will be at least a week  ----- Message ----- From: May, Wendy, RT Sent: 11/07/2022  11:55 AM EDT To: Waldon Reining; Amy Carlynn Purl, RT  When does she need it by?   ----- Message ----- From: Caffie Damme, RT Sent: 11/07/2022  11:49 AM EDT To: Toniann Fail May, RT; Faith R Strader  Can we get xray CD of ankle ready for her? I can't, I get to the last step and its grayed out, I told patient we would let her know when ready she is going to see Dr Nolen Mu and needs xray disk.

## 2022-11-08 NOTE — Patient Instructions (Signed)
You are being referred to physical therapy for back pain.  Please continue to take medications as prescribed.  Please continue to follow low salt diet and perform moderate exercise/walking at least 150 mins/week.  Please get fasting blood tests done before the next visit.

## 2022-11-08 NOTE — Telephone Encounter (Signed)
Patient has sent another message to Dr. Ophelia Charter this morning. Waiting for his response.

## 2022-11-08 NOTE — Telephone Encounter (Signed)
Called and advised patient that CD was ready for pickup at front desk.  

## 2022-11-09 ENCOUNTER — Other Ambulatory Visit: Payer: Self-pay

## 2022-11-09 ENCOUNTER — Encounter: Payer: Self-pay | Admitting: Internal Medicine

## 2022-11-09 DIAGNOSIS — R5382 Chronic fatigue, unspecified: Secondary | ICD-10-CM | POA: Insufficient documentation

## 2022-11-09 DIAGNOSIS — M5136 Other intervertebral disc degeneration, lumbar region: Secondary | ICD-10-CM

## 2022-11-09 NOTE — Assessment & Plan Note (Signed)
On Oxybutynin - has chronic dry mouth, but prefers to continue oxybutynin Used to follow-up with urogynecology

## 2022-11-09 NOTE — Progress Notes (Signed)
Established Patient Office Visit  Subjective:  Patient ID: [name], female    DOB: [birth year]  Age: 62 y.o. MRN: [id number]  CC:  Chief Complaint  Patient presents with   Fatigue    Patient states she has chronic fatigue, and stays tired all the time    HPI [name] is a 62 y.o. female with past medical history of HTN, CKD, asthma due to seasonal allergies, h/o hemorrhagic stroke in 1999 (unclear etiology), HLD, anxiety with depression, urinary incontinence and GERD who presents for c/o chronic fatigue and some other concerns.  Chronic fatigue: She reports chronic fatigue, but denies any fever, chills, chronic cough, hemoptysis, night sweats or recent weight loss.  She is currently taking Ritalin for ADD.  She has noticed fluctuation in her energy levels on daily basis.  She has a history of MDD and GAD as well, currently followed by psychiatry.  Overactive bladder: She takes oxybutynin for overactive bladder. She has seen urogynecology for it.  She has chronic dry mouth, but prefers to continue oxybutynin for now.  Denies any dysuria or hematuria currently.  She has chronic low back pain, had lumbar compression fracture in 02/24. Followed by spine surgery.  She is now on Tymlos for osteoporosis, but reports worsening of her back pain since starting it.  She has completed physical therapy, but reports that she was improving while doing physical therapy.  She reports family history of breast cancer.  Her mother had breast cancer and a maternal aunt also had ovarian cancer.  She asked for cancer gene analysis.  Past Medical History:  Diagnosis Date   Allergy    grass, dust , mold   Anxiety    Arthritis    Asthma due to seasonal allergies 06/15/2020   Bipolar disorder (HCC)    Carpal tunnel syndrome    Bilateral   Chest pain 09/2011   Cardiac cath-normal coronaries   Constipation    Depression    Difficulty urinating 05/31/2013   Elevated LFTs 12/16/2013    Encounter for general adult medical examination with abnormal findings 07/07/2021   GERD (gastroesophageal reflux disease)    History of kidney stones    Hyperlipemia    Hyperlipidemia    Hypertension    Mild; provoked by stress and anxiety   IBS (irritable bowel syndrome)    Intracerebral bleed (HCC)    No aneurysm; followed by Dr. Athena Masse replaced    "lense transplant" 2022; pt states lense don't dilate or constrict   Loss of weight 01/06/2015   Osteoporosis    Stage 3a chronic kidney disease (HCC) 03/16/2021   Stroke (HCC) 1999   hemorrhagic stroke; weakness of left side    Past Surgical History:  Procedure Laterality Date   BIOPSY  04/16/2021   Procedure: BIOPSY;  Surgeon: Marguerita Merles, Reuel Boom, MD;  Location: [facility];  Service: Gastroenterology;;  small, bowel, esophageal(proximal and distal);   BRAIN SURGERY  1999   to remove blood clot after stroke    CARDIAC CATHETERIZATION  2016   CERVICAL FUSION     CHOLECYSTECTOMY N/A 10/14/2014   Procedure: LAPAROSCOPIC CHOLECYSTECTOMY WITH INTRAOPERATIVE CHOLANGIOGRAM;  Surgeon: Avel Peace, MD;  Location: [facility] OR;  Service: General;  Laterality: N/A;   CHONDROPLASTY Right 07/13/2017   Procedure: CHONDROPLASTY of patella;  Surgeon: Vickki Hearing, MD;  Location: [facility];  Service: Orthopedics;  Laterality: Right;   ESOPHAGEAL DILATION N/A 04/16/2021   Procedure: ESOPHAGEAL DILATION;  Surgeon: Levon Hedger  Alisia Ferrari, MD;  Location: [facility];  Service: Gastroenterology;  Laterality: N/A;   ESOPHAGOGASTRODUODENOSCOPY (EGD) WITH PROPOFOL N/A 04/16/2021   Procedure: ESOPHAGOGASTRODUODENOSCOPY (EGD) WITH PROPOFOL;  Surgeon: Dolores Frame, MD;  Location: [facility];  Service: Gastroenterology;  Laterality: N/A;  1:35, pt knows to arrive at 9:45   EUS N/A 08/21/2015   Procedure: ESOPHAGEAL ENDOSCOPIC ULTRASOUND (EUS) RADIAL;  Surgeon: [name], MD;  Location: [facility];  Service:  Endoscopy;  Laterality: N/A;   KNEE ARTHROSCOPY WITH MEDIAL MENISECTOMY Right 07/13/2017   Procedure: KNEE ARTHROSCOPY WITH PARTIAL MEDIAL MENISECTOMY;  Surgeon: Vickki Hearing, MD;  Location: [facility];  Service: Orthopedics;  Laterality: Right;   LEFT HEART CATHETERIZATION WITH CORONARY ANGIOGRAM N/A 09/23/2011   Procedure: LEFT HEART CATHETERIZATION WITH CORONARY ANGIOGRAM;  Surgeon: Vesta Mixer, MD;  Location: [facility];  Service: Cardiovascular;  Laterality: N/A;   LIPOMA EXCISION Left 11/18/2013   Procedure: EXCISION OF SOFT TISSUE MASS-LEFT THIGH;  Surgeon: Dalia Heading, MD;  Location: [facility];  Service: General;  Laterality: Left;   NASAL SEPTOPLASTY W/ TURBINOPLASTY Bilateral 08/30/2021   Procedure: NASAL SEPTOPLASTY WITH BILATERAL TURBINATE REDUCTION;  Surgeon: Newman Pies, MD;  Location: [facility];  Service: ENT;  Laterality: Bilateral;   POLYPECTOMY  04/16/2021   Procedure: POLYPECTOMY;  Surgeon: Dolores Frame, MD;  Location: [facility];  Service: Gastroenterology;;  gastric   RECTOCELE REPAIR     x2   RECTOCELE REPAIR N/A 04/04/2017   Procedure: POSTERIOR REPAIR (RECTOCELE);  Surgeon: Tilda Burrow, MD;  Location: [facility];  Service: Gynecology;  Laterality: N/A;   TOTAL ABDOMINAL HYSTERECTOMY      Family History  Problem Relation Age of Onset   Cancer Mother        breast    Hypertension Mother    Hyperlipidemia Mother    Depression Mother    Anxiety disorder Mother    COPD Mother    Arthritis Mother        rheumatoid   Drug abuse Sister    Coronary artery disease Paternal Grandfather    Coronary artery disease Paternal Uncle    Depression Cousin    Drug abuse Cousin     Social History   Socioeconomic History   Marital status: Married    Spouse name: Dorene Sorrow   Number of children: 0   Years of education: HS   Highest education level: Not on file  Occupational History   Occupation: unemployed    Comment: pending disability   Tobacco Use   Smoking status: Former    Packs/day: 1.00    Years: 19.00    Additional pack years: 0.00    Total pack years: 19.00    Types: Cigarettes    Quit date: 09/01/1997    Years since quitting: 25.2    Passive exposure: Past   Smokeless tobacco: Never   Tobacco comments:    Quit smoking 1999 , previous 20 pack years  Vaping Use   Vaping Use: Never used  Substance and Sexual Activity   Alcohol use: Yes    Comment: 1 drink every other week   Drug use: No   Sexual activity: Not Currently    Partners: Male    Birth control/protection: Surgical    Comment: hyst   Other Topics Concern   Not on file  Social History Narrative   Currently unable to work   Lives in [place]   Married   Patient drinks 1  cup of caffeine daily.   Patient is right handed.    Joined the [employer] to get more exercise   Social Determinants of Health   Financial Resource Strain: Low Risk  (03/29/2022)   Overall Financial Resource Strain (CARDIA)    Difficulty of Paying Living Expenses: Not hard at all  Food Insecurity: No Food Insecurity (03/29/2022)   Hunger Vital Sign    Worried About Running Out of Food in the Last Year: Never true    Ran Out of Food in the Last Year: Never true  Transportation Needs: No Transportation Needs (03/29/2022)   PRAPARE - Administrator, Civil Service (Medical): No    Lack of Transportation (Non-Medical): No  Physical Activity: Inactive (03/29/2022)   Exercise Vital Sign    Days of Exercise per Week: 0 days    Minutes of Exercise per Session: 0 min  Stress: Stress Concern Present (03/29/2022)   [employer] of Occupational Health - Occupational Stress Questionnaire    Feeling of Stress : To some extent  Social Connections: Moderately Isolated (03/29/2022)   Social Connection and Isolation Panel [NHANES]    Frequency of Communication with Friends and Family: Twice a week    Frequency of Social Gatherings with Friends and Family: Once a week    Attends  Religious Services: Never    Database administrator or Organizations: No    Attends Banker Meetings: Never    Marital Status: Married  Catering manager Violence: Not At Risk (03/29/2022)   Humiliation, Afraid, Rape, and Kick questionnaire    Fear of Current or Ex-Partner: No    Emotionally Abused: No    Physically Abused: No    Sexually Abused: No    Outpatient Medications Prior to Visit  Medication Sig Dispense Refill   acetaminophen (TYLENOL 8 HOUR) 650 MG CR tablet Take 650 mg by mouth every 8 (eight) hours.     acetaminophen (TYLENOL) 500 MG tablet Take 500 mg by mouth. As needed     ALPRAZolam (XANAX) 1 MG tablet Take 1 tablet (1 mg total) by mouth 3 (three) times daily. 90 tablet 2   atorvastatin (LIPITOR) 40 MG tablet Take 1 tablet (40 mg total) by mouth daily. 90 tablet 3   Calcium 500-125 MG-UNIT TABS Take 1 tablet by mouth daily with supper.     Carboxymethylcellulose Sod PF 0.5 % SOLN Place 1 drop into both eyes daily as needed (dry eyes).     cetirizine (ZYRTEC) 10 MG tablet Take 10 mg by mouth daily.     cyclobenzaprine (FLEXERIL) 5 MG tablet Take 1 tablet (5 mg total) by mouth 3 (three) times daily as needed. for muscle spams 90 tablet 2   diclofenac Sodium (VOLTAREN) 1 % GEL Apply 1 application topically daily as needed (pain).     estradiol (ESTRACE) 0.1 MG/GM vaginal cream Place 0.5 g vaginally 2 (two) times a week. Place 0.5g nightly for two weeks then twice a week after 30 g 11   ezetimibe (ZETIA) 10 MG tablet Take 1 tablet (10 mg total) by mouth daily. 90 tablet 3   famotidine (PEPCID) 20 MG tablet Take 20 mg by mouth 2 (two) times daily. As needed     lidocaine-prilocaine (EMLA) cream Apply 1 Application topically at bedtime.     lisinopril (ZESTRIL) 20 MG tablet Take 1 tablet (20 mg total) by mouth daily. 90 tablet 0   methylphenidate (RITALIN) 20 MG tablet Take 1 tablet (20 mg total) by  mouth 2 (two) times daily with breakfast and lunch. 60 tablet 0    methylphenidate (RITALIN) 20 MG tablet Take 1 tablet (20 mg total) by mouth 2 (two) times daily with breakfast and lunch. 60 tablet 0   methylphenidate (RITALIN) 20 MG tablet Take 1 tablet (20 mg total) by mouth 2 (two) times daily with breakfast and lunch. 60 tablet 0   montelukast (SINGULAIR) 10 MG tablet TAKE (1) TABLET BY MOUTH AT BEDTIME. 90 tablet 0   Multiple Vitamin (MULITIVITAMIN WITH MINERALS) TABS Take 1 tablet by mouth daily with breakfast.     Nystatin POWD by Does not apply route. As needed.     Olopatadine HCl 0.2 % SOLN Place 1 drop into both eyes in the morning and at bedtime.     Omega-3 Fatty Acids (FISH OIL) 1000 MG CAPS Take 2,000 mg by mouth.     omeprazole (PRILOSEC) 40 MG capsule TAKE 1 CAPSULE BY MOUTH TWICE DAILY 60 capsule 0   OVER THE COUNTER MEDICATION Milk thistle once per day.  Mushrooms once daily.  Vit K 2 once per day.  Beet root gummies daily     Probiotic Product (TRUBIOTICS PO) Take 1 capsule by mouth daily. Takes 25 cfu daily.     pyridOXINE (VITAMIN B-6) 100 MG tablet Take 100 mg by mouth daily.     traZODone (DESYREL) 100 MG tablet TAKE (2) TABLETS BY MOUTH AT BEDTIME. 60 tablet 0   TYMLOS 3120 MCG/1.56ML SOPN One qhs     venlafaxine XR (EFFEXOR XR) 150 MG 24 hr capsule Take 1 capsule (150 mg total) by mouth daily with breakfast. 90 capsule 2   oxybutynin (DITROPAN-XL) 10 MG 24 hr tablet TAKE (1) TABLET BY MOUTH AT BEDTIME 90 tablet 0   No facility-administered medications prior to visit.    Allergies  Allergen Reactions   Morphine And Related Hives   Promethazine Hcl Other (See Comments)    Causes patient to become Hyper    ROS Review of Systems  Constitutional:  Positive for fatigue. Negative for chills and fever.  HENT:  Negative for congestion, sinus pressure and sinus pain.   Eyes:  Negative for pain and discharge.  Respiratory:  Negative for cough and shortness of breath.   Cardiovascular:  Negative for chest pain and palpitations.   Gastrointestinal:  Positive for abdominal pain and constipation. Negative for diarrhea, nausea and vomiting.  Endocrine: Negative for polydipsia and polyuria.  Genitourinary:  Negative for dysuria and hematuria.  Musculoskeletal:  Positive for arthralgias and back pain. Negative for neck stiffness.  Skin:  Negative for rash.  Allergic/Immunologic: Positive for environmental allergies.  Neurological:  Negative for dizziness and weakness.  Psychiatric/Behavioral:  Positive for dysphoric mood. Negative for agitation and behavioral problems. The patient is nervous/anxious.       Objective:    Physical Exam Vitals reviewed.  Constitutional:      General: [name] "[name]" is not in acute distress.    Appearance: [name] "[name]" is not diaphoretic.  HENT:     Head: Normocephalic and atraumatic.     Nose: Nose normal. No congestion.     Mouth/Throat:     Mouth: Mucous membranes are moist.     Pharynx: No posterior oropharyngeal erythema.  Eyes:     General: No scleral icterus.    Extraocular Movements: Extraocular movements intact.  Cardiovascular:     Rate and Rhythm: Normal rate and regular rhythm.     Pulses: Normal pulses.  Heart sounds: Normal heart sounds. No murmur heard. Pulmonary:     Breath sounds: Normal breath sounds. No wheezing or rales.  Musculoskeletal:     Cervical back: Neck supple. No tenderness.     Lumbar back: Tenderness present.  Skin:    General: Skin is warm.     Findings: No rash.  Neurological:     General: No focal deficit present.     Mental Status: [name] "[name]" is alert and oriented to person, place, and time.     Comments: Facial droop, chronic  Psychiatric:        Mood and Affect: Mood normal.        Behavior: Behavior normal.     BP 121/77 (BP Location: Right Arm, Patient Position: Sitting, Cuff Size: Normal)   Pulse 83   Ht 5\' 3"  (1.6 m)   Wt 136 lb (61.7 kg)   SpO2 91%   BMI 24.09 kg/m  Wt Readings from  Last 3 Encounters:  11/08/22 136 lb (61.7 kg)  10/24/22 134 lb (60.8 kg)  08/29/22 134 lb 9.6 oz (61.1 kg)    Lab Results  Component Value Date   TSH 1.720 08/25/2022   Lab Results  Component Value Date   WBC 11.5 (H) 08/25/2022   HGB 12.1 08/25/2022   HCT 36.7 08/25/2022   MCV 97 08/25/2022   PLT 492 (H) 08/25/2022   Lab Results  Component Value Date   NA 139 08/25/2022   K 4.9 08/25/2022   CO2 25 08/25/2022   GLUCOSE 101 (H) 08/25/2022   BUN 19 08/25/2022   CREATININE 1.16 (H) 08/25/2022   BILITOT 0.4 08/25/2022   ALKPHOS 126 (H) 08/25/2022   AST 17 08/25/2022   ALT 25 08/25/2022   PROT 7.9 08/25/2022   ALBUMIN 4.6 08/25/2022   CALCIUM 10.0 08/25/2022   ANIONGAP 8 08/25/2021   EGFR 54 (L) 08/25/2022   Lab Results  Component Value Date   CHOL 138 08/25/2022   Lab Results  Component Value Date   HDL 63 08/25/2022   Lab Results  Component Value Date   LDLCALC 52 08/25/2022   Lab Results  Component Value Date   TRIG 133 08/25/2022   Lab Results  Component Value Date   CHOLHDL 2.2 08/25/2022   Lab Results  Component Value Date   HGBA1C 5.8 (H) 08/25/2022      Assessment & Plan:   Problem List Items Addressed This Visit       Cardiovascular and Mediastinum   Hypertension    BP Readings from Last 1 Encounters:  11/08/22 121/77  Well-controlled with lisinopril 20 mg QD Counseled for compliance with the medications Advised DASH diet and moderate exercise/walking, at least 150 mins/week      Relevant Orders   CBC   CMP14+EGFR     Musculoskeletal and Integument   DDD (degenerative disc disease), lumbar    Has history of DDD of lumbar spine and had recent lumbar compression fracture Would avoid oral NSAIDs due to history of GERD and CKD Tylenol as needed for now Flexeril as needed for muscle spasms Referred to PT      Relevant Orders   Ambulatory referral to Physical Therapy   Osteoporosis with current pathological fracture    Had  lumbar compression fracture Last DEXA scan reviewed On Tymlos        Genitourinary   Stage 3a chronic kidney disease (HCC)    Last BMP showed GFR of 54, stable Needs to  maintain adequate hydration Avoid nephrotoxic agents including NSAIDs, Dced Nabumetone and Diclofenac On Lisinopril      Relevant Orders   CBC   Vitamin D (25 hydroxy)   CMP14+EGFR     Other   Mixed hyperlipidemia    On Atorvastatin 40 mg QD due to h/o CVA and uncontrolled HLD      Relevant Orders   Lipid Profile   Urinary incontinence    On Oxybutynin - has chronic dry mouth, but prefers to continue oxybutynin Used to follow-up with urogynecology      Relevant Medications   oxybutynin (DITROPAN-XL) 10 MG 24 hr tablet   Family history of breast cancer    Her mother had breast cancer and a maternal aunt had ovarian cancer Check BRCA analysis      Relevant Orders   BRCAssure Comprehensive Panel   Chronic fatigue - Primary    Likely multifactorial - her chronic low back pain from compression fracture has limited her physical activity MDD, GAD and ADD also contributing to her apathy, followed by psychiatry Currently does not have any B symptoms Up to date with colonoscopy Has CKD, but electrolytes are WNL Check CBC, CMP, TSH, B12 level      Relevant Orders   CBC   TSH   Vitamin D (25 hydroxy)   B12   CMP14+EGFR   Other Visit Diagnoses     Prediabetes       Relevant Orders   Hemoglobin A1c       Meds ordered this encounter  Medications   oxybutynin (DITROPAN-XL) 10 MG 24 hr tablet    Sig: Take 1 tablet (10 mg total) by mouth at bedtime.    Dispense:  90 tablet    Refill:  1    Follow-up: Return in about 3 months (around 02/08/2023).    Anabel Halon, MD

## 2022-11-09 NOTE — Assessment & Plan Note (Signed)
Her mother had breast cancer and a maternal aunt had ovarian cancer Check BRCA analysis

## 2022-11-09 NOTE — Assessment & Plan Note (Signed)
On Atorvastatin 40 mg QD due to h/o CVA and uncontrolled HLD 

## 2022-11-09 NOTE — Assessment & Plan Note (Signed)
Has history of DDD of lumbar spine and had recent lumbar compression fracture Would avoid oral NSAIDs due to history of GERD and CKD Tylenol as needed for now Flexeril as needed for muscle spasms Referred to PT

## 2022-11-09 NOTE — Assessment & Plan Note (Signed)
BP Readings from Last 1 Encounters:  11/08/22 121/77   Well-controlled with lisinopril 20 mg QD Counseled for compliance with the medications Advised DASH diet and moderate exercise/walking, at least 150 mins/week

## 2022-11-09 NOTE — Assessment & Plan Note (Signed)
Likely multifactorial - her chronic low back pain from compression fracture has limited her physical activity MDD, GAD and ADD also contributing to her apathy, followed by psychiatry Currently does not have any B symptoms Up to date with colonoscopy Has CKD, but electrolytes are WNL Check CBC, CMP, TSH, B12 level

## 2022-11-09 NOTE — Assessment & Plan Note (Signed)
Had lumbar compression fracture Last DEXA scan reviewed On Tymlos

## 2022-11-09 NOTE — Assessment & Plan Note (Signed)
Last BMP showed GFR of 54, stable Needs to maintain adequate hydration Avoid nephrotoxic agents including NSAIDs, Dced Nabumetone and Diclofenac On Lisinopril 

## 2022-11-13 ENCOUNTER — Encounter (INDEPENDENT_AMBULATORY_CARE_PROVIDER_SITE_OTHER): Payer: Self-pay

## 2022-11-14 ENCOUNTER — Encounter (HOSPITAL_COMMUNITY): Payer: Self-pay

## 2022-11-14 ENCOUNTER — Encounter (HOSPITAL_COMMUNITY): Payer: Self-pay | Admitting: Psychiatry

## 2022-11-14 ENCOUNTER — Telehealth (INDEPENDENT_AMBULATORY_CARE_PROVIDER_SITE_OTHER): Payer: PPO | Admitting: Psychiatry

## 2022-11-14 DIAGNOSIS — F332 Major depressive disorder, recurrent severe without psychotic features: Secondary | ICD-10-CM | POA: Diagnosis not present

## 2022-11-14 DIAGNOSIS — F988 Other specified behavioral and emotional disorders with onset usually occurring in childhood and adolescence: Secondary | ICD-10-CM | POA: Diagnosis not present

## 2022-11-14 DIAGNOSIS — R413 Other amnesia: Secondary | ICD-10-CM | POA: Diagnosis not present

## 2022-11-14 MED ORDER — VENLAFAXINE HCL ER 150 MG PO CP24: 150.0000 mg | ORAL_CAPSULE | Freq: Every day | ORAL | 2 refills | Status: DC

## 2022-11-14 MED ORDER — METHYLPHENIDATE HCL ER (OSM) 36 MG PO TBCR: 36.0000 mg | EXTENDED_RELEASE_TABLET | Freq: Every day | ORAL | 0 refills | Status: DC

## 2022-11-14 MED ORDER — TRAZODONE HCL 100 MG PO TABS: ORAL_TABLET | ORAL | 2 refills | Status: DC

## 2022-11-14 MED ORDER — ALPRAZOLAM 1 MG PO TABS: 1.0000 mg | ORAL_TABLET | Freq: Three times a day (TID) | ORAL | 2 refills | Status: DC

## 2022-11-14 NOTE — Progress Notes (Signed)
Virtual Visit via Telephone Note  I connected with [name] on 11/14/22 at  1:00 PM EDT by telephone and verified that I am speaking with the correct person using two identifiers.  Location: Patient: home Provider: office   I discussed the limitations, risks, security and privacy concerns of performing an evaluation and management service by telephone and the availability of in person appointments. I also discussed with the patient that there may be a patient responsible charge related to this service. The patient expressed understanding and agreed to proceed.    I discussed the assessment and treatment plan with the patient. The patient was provided an opportunity to ask questions and all were answered. The patient agreed with the plan and demonstrated an understanding of the instructions.   The patient was advised to call back or seek an in-person evaluation if the symptoms worsen or if the condition fails to improve as anticipated.  I provided 20 minutes of non-face-to-face time during this encounter.   [name], MD  [facility] MD/PA/NP OP Progress Note  11/14/2022 1:29 PM [name]  MRN:  [id number]  Chief Complaint:  Chief Complaint  Patient presents with   Depression   Anxiety   Follow-up   ADD   HPI: T his patient is a 62 year old married white female who lives with her husband in [place]. She has no children. 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    The patient returns for follow-up after 2-1/2 months.  She states that she has found to have severe ostial porosis and is now on nightly injectable medication for it.  She has chronic back and knee pain that makes it difficult to do a lot of things.  She states that she is "constantly aggravated" because she cannot do the things she wants to do.  She also suffers from chronic fatigue and states that she no longer feels like the methylphenidate is helping with  her focus.  We have tried Adderall in the past which did not help and caused side effects.  I suggested we try Concerta which is longer acting and she is willing to give it a try.  She still feels somewhat depressed but it is mostly related to her chronic pain.  She denies any suicidal ideation or plan. Visit Diagnosis:    ICD-10-CM   1. Major depressive disorder, recurrent, severe without psychotic features (HCC)  F33.2     2. ADD (attention deficit disorder) without hyperactivity  F98.8     3. Memory deficit  R41.3       Past Psychiatric History: Past outpatient treatment for depression  Past Medical History:  Past Medical History:  Diagnosis Date   Allergy    grass, dust , mold   Anxiety    Arthritis    Asthma due to seasonal allergies 06/15/2020   Bipolar disorder (HCC)    Carpal tunnel syndrome    Bilateral   Chest pain 09/2011   Cardiac cath-normal coronaries   Constipation    Depression    Difficulty urinating 05/31/2013   Elevated LFTs 12/16/2013   Encounter for general adult medical examination with abnormal findings 07/07/2021   GERD (gastroesophageal reflux disease)    History of kidney stones    Hyperlipemia    Hyperlipidemia    Hypertension    Mild; provoked by stress and anxiety   IBS (irritable bowel syndrome)    Intracerebral bleed (HCC)  No aneurysm; followed by Dr. Athena Masse replaced    "lense transplant" 2022; pt states lense don't dilate or constrict   Loss of weight 01/06/2015   Osteoporosis    Stage 3a chronic kidney disease (HCC) 03/16/2021   Stroke (HCC) 1999   hemorrhagic stroke; weakness of left side    Past Surgical History:  Procedure Laterality Date   BIOPSY  04/16/2021   Procedure: BIOPSY;  Surgeon: Marguerita Merles, Reuel Boom, MD;  [facility];  Service: Gastroenterology;;  small, bowel, esophageal(proximal and distal);   BRAIN SURGERY  1999   to remove blood clot after stroke    CARDIAC CATHETERIZATION  2016    CERVICAL FUSION     CHOLECYSTECTOMY N/A 10/14/2014   Procedure: LAPAROSCOPIC CHOLECYSTECTOMY WITH INTRAOPERATIVE CHOLANGIOGRAM;  Surgeon: Avel Peace, MD;  Location: [facility] OR;  Service: General;  Laterality: N/A;   CHONDROPLASTY Right 07/13/2017   Procedure: CHONDROPLASTY of patella;  Surgeon: Vickki Hearing, MD;  Location: [facility];  Service: Orthopedics;  Laterality: Right;   ESOPHAGEAL DILATION N/A 04/16/2021   Procedure: ESOPHAGEAL DILATION;  Surgeon: Dolores Frame, MD;  [facility];  Service: Gastroenterology;  Laterality: N/A;   ESOPHAGOGASTRODUODENOSCOPY (EGD) WITH PROPOFOL N/A 04/16/2021   Procedure: ESOPHAGOGASTRODUODENOSCOPY (EGD) WITH PROPOFOL;  Surgeon: Dolores Frame, MD;  [facility];  Service: Gastroenterology;  Laterality: N/A;  1:35, pt knows to arrive at 9:45   EUS N/A 08/21/2015   Procedure: ESOPHAGEAL ENDOSCOPIC ULTRASOUND (EUS) RADIAL;  Surgeon: [name], MD;  [facility];  Service: Endoscopy;  Laterality: N/A;   KNEE ARTHROSCOPY WITH MEDIAL MENISECTOMY Right 07/13/2017   Procedure: KNEE ARTHROSCOPY WITH PARTIAL MEDIAL MENISECTOMY;  Surgeon: Vickki Hearing, MD;  Location: [facility];  Service: Orthopedics;  Laterality: Right;   LEFT HEART CATHETERIZATION WITH CORONARY ANGIOGRAM N/A 09/23/2011   Procedure: LEFT HEART CATHETERIZATION WITH CORONARY ANGIOGRAM;  Surgeon: Vesta Mixer, MD;  Location[facility];  Service: Cardiovascular;  Laterality: N/A;   LIPOMA EXCISION Left 11/18/2013   Procedure: EXCISION OF SOFT TISSUE MASS-LEFT THIGH;  Surgeon: Dalia Heading, MD;  Location: [facility];  Service: General;  Laterality: Left;   NASAL SEPTOPLASTY W/ TURBINOPLASTY Bilateral 08/30/2021   Procedure: NASAL SEPTOPLASTY WITH BILATERAL TURBINATE REDUCTION;  Surgeon: Newman Pies, MD;  Location: [facility];  Service: ENT;  Laterality: Bilateral;   POLYPECTOMY  04/16/2021   Procedure: POLYPECTOMY;  Surgeon:  Dolores Frame, MD;  [facility];  Service: Gastroenterology;;  gastric   RECTOCELE REPAIR     x2   RECTOCELE REPAIR N/A 04/04/2017   Procedure: POSTERIOR REPAIR (RECTOCELE);  Surgeon: Tilda Burrow, MD;  Location: [facility];  Service: Gynecology;  Laterality: N/A;   TOTAL ABDOMINAL HYSTERECTOMY      Family Psychiatric History: See below  Family History:  Family History  Problem Relation Age of Onset   Cancer Mother        breast    Hypertension Mother    Hyperlipidemia Mother    Depression Mother    Anxiety disorder Mother    COPD Mother    Arthritis Mother        rheumatoid   Drug abuse Sister    Coronary artery disease Paternal Grandfather    Coronary artery disease Paternal Uncle    Depression Cousin    Drug abuse Cousin     Social History:  Social History   Socioeconomic History   Marital status: Married  Spouse name: Dorene Sorrow   Number of children: 0   Years of education: HS   Highest education level: Not on file  Occupational History   Occupation: unemployed    Comment: pending disability  Tobacco Use   Smoking status: Former    Packs/day: 1.00    Years: 19.00    Additional pack years: 0.00    Total pack years: 19.00    Types: Cigarettes    Quit date: 09/01/1997    Years since quitting: 25.2    Passive exposure: Past   Smokeless tobacco: Never   Tobacco comments:    Quit smoking 1999 , previous 20 pack years  Vaping Use   Vaping Use: Never used  Substance and Sexual Activity   Alcohol use: Yes    Comment: 1 drink every other week   Drug use: No   Sexual activity: Not Currently    Partners: Male    Birth control/protection: Surgical    Comment: hyst   Other Topics Concern   Not on file  Social History Narrative   Currently unable to work   Lives in [place]   Married   Patient drinks 1 cup of caffeine daily.   Patient is right handed.    Joined the [employer] to get more exercise   Social Determinants of Health   Financial  Resource Strain: Low Risk  (03/29/2022)   Overall Financial Resource Strain (CARDIA)    Difficulty of Paying Living Expenses: Not hard at all  Food Insecurity: No Food Insecurity (03/29/2022)   Hunger Vital Sign    Worried About Running Out of Food in the Last Year: Never true    Ran Out of Food in the Last Year: Never true  Transportation Needs: No Transportation Needs (03/29/2022)   PRAPARE - Administrator, Civil Service (Medical): No    Lack of Transportation (Non-Medical): No  Physical Activity: Inactive (03/29/2022)   Exercise Vital Sign    Days of Exercise per Week: 0 days    Minutes of Exercise per Session: 0 min  Stress: Stress Concern Present (03/29/2022)   [employer] of Occupational Health - Occupational Stress Questionnaire    Feeling of Stress : To some extent  Social Connections: Moderately Isolated (03/29/2022)   Social Connection and Isolation Panel [NHANES]    Frequency of Communication with Friends and Family: Twice a week    Frequency of Social Gatherings with Friends and Family: Once a week    Attends Religious Services: Never    Database administrator or Organizations: No    Attends Banker Meetings: Never    Marital Status: Married    Allergies:  Allergies  Allergen Reactions   Morphine And Related Hives   Promethazine Hcl Other (See Comments)    Causes patient to become Hyper    Metabolic Disorder Labs: Lab Results  Component Value Date   HGBA1C 5.8 (H) 08/25/2022   MPG 117 (H) 01/06/2015   MPG 117 (H) 02/17/2014   No results found for: "PROLACTIN" Lab Results  Component Value Date   CHOL 138 08/25/2022   TRIG 133 08/25/2022   HDL 63 08/25/2022   CHOLHDL 2.2 08/25/2022   VLDL 26 09/16/2015   LDLCALC 52 08/25/2022   LDLCALC 98 05/12/2021   Lab Results  Component Value Date   TSH 1.720 08/25/2022   TSH 2.430 05/12/2021    Therapeutic Level Labs: No results found for: "LITHIUM" No results found for:  "VALPROATE" No results found for: "  CBMZ"  Current Medications: Current Outpatient Medications  Medication Sig Dispense Refill   methylphenidate (CONCERTA) 36 MG PO CR tablet Take 1 tablet (36 mg total) by mouth daily. 30 tablet 0   methylphenidate (CONCERTA) 36 MG PO CR tablet Take 1 tablet (36 mg total) by mouth daily. 30 tablet 0   acetaminophen (TYLENOL 8 HOUR) 650 MG CR tablet Take 650 mg by mouth every 8 (eight) hours.     acetaminophen (TYLENOL) 500 MG tablet Take 500 mg by mouth. As needed     ALPRAZolam (XANAX) 1 MG tablet Take 1 tablet (1 mg total) by mouth 3 (three) times daily. 60 tablet 2   atorvastatin (LIPITOR) 40 MG tablet Take 1 tablet (40 mg total) by mouth daily. 90 tablet 3   Calcium 500-125 MG-UNIT TABS Take 1 tablet by mouth daily with supper.     Carboxymethylcellulose Sod PF 0.5 % SOLN Place 1 drop into both eyes daily as needed (dry eyes).     cetirizine (ZYRTEC) 10 MG tablet Take 10 mg by mouth daily.     cyclobenzaprine (FLEXERIL) 5 MG tablet Take 1 tablet (5 mg total) by mouth 3 (three) times daily as needed. for muscle spams 90 tablet 2   diclofenac Sodium (VOLTAREN) 1 % GEL Apply 1 application topically daily as needed (pain).     estradiol (ESTRACE) 0.1 MG/GM vaginal cream Place 0.5 g vaginally 2 (two) times a week. Place 0.5g nightly for two weeks then twice a week after 30 g 11   ezetimibe (ZETIA) 10 MG tablet Take 1 tablet (10 mg total) by mouth daily. 90 tablet 3   famotidine (PEPCID) 20 MG tablet Take 20 mg by mouth 2 (two) times daily. As needed     lidocaine-prilocaine (EMLA) cream Apply 1 Application topically at bedtime.     lisinopril (ZESTRIL) 20 MG tablet Take 1 tablet (20 mg total) by mouth daily. 90 tablet 0   methylphenidate (RITALIN) 20 MG tablet Take 1 tablet (20 mg total) by mouth 2 (two) times daily with breakfast and lunch. 60 tablet 0   methylphenidate (RITALIN) 20 MG tablet Take 1 tablet (20 mg total) by mouth 2 (two) times daily with  breakfast and lunch. 60 tablet 0   methylphenidate (RITALIN) 20 MG tablet Take 1 tablet (20 mg total) by mouth 2 (two) times daily with breakfast and lunch. 60 tablet 0   montelukast (SINGULAIR) 10 MG tablet TAKE (1) TABLET BY MOUTH AT BEDTIME. 90 tablet 0   Multiple Vitamin (MULITIVITAMIN WITH MINERALS) TABS Take 1 tablet by mouth daily with breakfast.     Nystatin POWD by Does not apply route. As needed.     Olopatadine HCl 0.2 % SOLN Place 1 drop into both eyes in the morning and at bedtime.     Omega-3 Fatty Acids (FISH OIL) 1000 MG CAPS Take 2,000 mg by mouth.     omeprazole (PRILOSEC) 40 MG capsule TAKE 1 CAPSULE BY MOUTH TWICE DAILY 60 capsule 0   OVER THE COUNTER MEDICATION Milk thistle once per day.  Mushrooms once daily.  Vit K 2 once per day.  Beet root gummies daily     oxybutynin (DITROPAN-XL) 10 MG 24 hr tablet Take 1 tablet (10 mg total) by mouth at bedtime. 90 tablet 1   Probiotic Product (TRUBIOTICS PO) Take 1 capsule by mouth daily. Takes 25 cfu daily.     pyridOXINE (VITAMIN B-6) 100 MG tablet Take 100 mg by mouth daily.  traZODone (DESYREL) 100 MG tablet TAKE (2) TABLETS BY MOUTH AT BEDTIME. 60 tablet 2   TYMLOS 3120 MCG/1.56ML SOPN One qhs     venlafaxine XR (EFFEXOR XR) 150 MG 24 hr capsule Take 1 capsule (150 mg total) by mouth daily with breakfast. 90 capsule 2   No current facility-administered medications for this visit.     Musculoskeletal: Strength & Muscle Tone: na Gait & Station: na Patient leans: N/A  Psychiatric Specialty Exam: Review of Systems  Constitutional:  Positive for fatigue.  Musculoskeletal:  Positive for arthralgias, back pain and myalgias.  Psychiatric/Behavioral:  Positive for decreased concentration.   All other systems reviewed and are negative.   There were no vitals taken for this visit.There is no height or weight on file to calculate BMI.  General Appearance: NA  Eye Contact:  NA  Speech:  Clear and Coherent  Volume:   Normal  Mood:  Dysphoric and Irritable  Affect:  NA  Thought Process:  Goal Directed  Orientation:  Full (Time, Place, and Person)  Thought Content: Rumination   Suicidal Thoughts:  No  Homicidal Thoughts:  No  Memory:  Immediate;   Good Recent;   Fair Remote;   Fair  Judgement:  Good  Insight:  Fair  Psychomotor Activity:  Decreased  Concentration:  Concentration: Poor and Attention Span: Poor  Recall:  Fair  Fund of Knowledge: Good  Language: Good  Akathisia:  No  Handed:  Right  AIMS (if indicated): not done  Assets:  Communication Skills Desire for Improvement Resilience Social Support  ADL's:  Intact  Cognition: WNL  Sleep:  Good   Screenings: AUDIT    Flowsheet Row Clinical Support from 01/06/2021 in [facility]  Alcohol Use Disorder Identification Test Final Score (AUDIT) 4      GAD-7    Flowsheet Row Office Visit from 08/29/2022 in [facility]  Total GAD-7 Score 6      MDI    Flowsheet Row Office Visit from 01/22/2016 in [facility]  Total Score (max 50) 34      Mini-Mental    Flowsheet Row Office Visit from 02/13/2015 in [facility]  Total Score (max 30 points ) 26      PHQ2-9    Flowsheet Row Office Visit from 08/29/2022 in [facility] Office Visit from 05/09/2022 in [facility] Clinical Support from 03/29/2022 in [facility] Video Visit from 02/15/2022 in [facility] at [place] Video Visit from 11/22/2021 in [facility] at [facility] Total Score 2 1 1  0 2  PHQ-9 Total Score 5 -- -- -- 9      SBQ-R    Flowsheet Row Office Visit from 01/22/2016 in [facility] [name] Total Score 14.1      Flowsheet Row ED from 04/15/2022 in [facility] at [facility] Video Visit from 02/15/2022 in [facility] at [place] Video Visit from 11/22/2021 in [facility] at [place]  C-SSRS RISK CATEGORY No Risk No Risk Error: Q3, 4, or 5 should not be populated when Q2 is No        Assessment and Plan: This patient is a 62 year old female with a history of depression anxiety attention deficit disorder chronic pain and chronic fatigue.  She  does not feel like the methylphenidate is helping with her focus so we will switch to Concerta 36 mg every morning.  Effexor XR 150 mg will be continued daily for depression, trazodone 200 mg at bedtime for sleep and Xanax has been reduced to 1 mg twice daily for anxiety to reduce sedation.  She will return to see me in 2 months  Collaboration of Care: Collaboration of Care: Primary Care Provider AEB notes are shared with PCP on the epic system  Patient/Guardian was advised Release of Information must be obtained prior to any record release in order to collaborate their care with an outside provider. Patient/Guardian was advised if they have not already done so to contact the registration department to sign all necessary forms in order for [place] to release information regarding their care.   Consent: Patient/Guardian gives verbal consent for treatment and assignment of benefits for services provided during this visit. Patient/Guardian expressed understanding and agreed to proceed.    [name], MD 11/14/2022, 1:29 PM

## 2022-11-15 ENCOUNTER — Other Ambulatory Visit (HOSPITAL_COMMUNITY): Payer: Self-pay | Admitting: Psychiatry

## 2022-11-15 ENCOUNTER — Telehealth (HOSPITAL_COMMUNITY): Payer: Self-pay | Admitting: *Deleted

## 2022-11-15 DIAGNOSIS — M79671 Pain in right foot: Secondary | ICD-10-CM | POA: Diagnosis not present

## 2022-11-15 DIAGNOSIS — M25571 Pain in right ankle and joints of right foot: Secondary | ICD-10-CM | POA: Diagnosis not present

## 2022-11-15 MED ORDER — METHYLPHENIDATE HCL 10 MG PO TABS: 10.0000 mg | ORAL_TABLET | Freq: Two times a day (BID) | ORAL | 0 refills | Status: DC

## 2022-11-15 MED ORDER — METHYLPHENIDATE HCL 20 MG PO TABS: 20.0000 mg | ORAL_TABLET | Freq: Two times a day (BID) | ORAL | 0 refills | Status: DC

## 2022-11-15 NOTE — Telephone Encounter (Signed)
Pharmacy stated they do not know what patient is taking due to so many medications coming over to them. They have received Concerta, Ritalin 10mg  and Ritalin 20mg . Then they also received a script for Effexor. Pharmacy would like to know which of on the first two medication is patient taking and should the also go ahead and fill the Effexor. Spoke with Buyer, retail at the pharmacy [facility]).    Patient stated she sent a mychart message to provider asking her what is the Effexor suppose to do? Spoke with patient and she seemed confused as to which one of the 3 medications that's being questioned she's taking. But patient then stated she is taking the Effexor XR. Patient stated she is taking the 20mg  BID Ritalin. Per pt she do not have a 10mg  Ritalin. Per pt she have never had the 10mg  Ritalin. Per pt with the Concerta, she have not picked that up. Per pt she did not know provider was going to send that in.    Please advise. Please call patient and clarify with her what she is suppose to be taking and what she is not taking. Patient needs advice.

## 2022-11-16 ENCOUNTER — Encounter: Payer: Self-pay | Admitting: Internal Medicine

## 2022-11-16 ENCOUNTER — Encounter (HOSPITAL_COMMUNITY): Payer: Self-pay | Admitting: *Deleted

## 2022-11-16 DIAGNOSIS — S32019D Unspecified fracture of first lumbar vertebra, subsequent encounter for fracture with routine healing: Secondary | ICD-10-CM | POA: Diagnosis not present

## 2022-11-16 DIAGNOSIS — M545 Low back pain, unspecified: Secondary | ICD-10-CM | POA: Diagnosis not present

## 2022-11-16 DIAGNOSIS — M6281 Muscle weakness (generalized): Secondary | ICD-10-CM | POA: Diagnosis not present

## 2022-11-16 NOTE — Telephone Encounter (Signed)
Spoke with patient and pharmacy.  Informed [name] the pharmacist and informed him with what provider stated. Per [name], the Concerta is $37.41 and will hold on to the script for the Concerta until patient comes to pick it up. Informed [name] that staff will let patient know the price   Informed patient with what provider stated and she stated that $37.41 is not that bad and she will go ahead and pick it up. Staff informed patient that she has to hold the Ritalin while she is on the Ritalin. Staff informed patient to call office if the Concerta is not working so we can let provider know. Patient agreed and verbalized understanding. Per pt she will try to get medication today.   Patient agreed to try Concerta. Patient agreed to call office back with the status on her Concerta after she starts it.

## 2022-11-16 NOTE — Telephone Encounter (Signed)
okay

## 2022-11-17 ENCOUNTER — Encounter (HOSPITAL_COMMUNITY): Payer: Self-pay

## 2022-11-18 ENCOUNTER — Encounter: Payer: Self-pay | Admitting: Internal Medicine

## 2022-11-18 ENCOUNTER — Other Ambulatory Visit: Payer: Self-pay | Admitting: Internal Medicine

## 2022-11-18 DIAGNOSIS — K760 Fatty (change of) liver, not elsewhere classified: Secondary | ICD-10-CM

## 2022-11-18 DIAGNOSIS — S32019D Unspecified fracture of first lumbar vertebra, subsequent encounter for fracture with routine healing: Secondary | ICD-10-CM | POA: Diagnosis not present

## 2022-11-18 DIAGNOSIS — M6281 Muscle weakness (generalized): Secondary | ICD-10-CM | POA: Diagnosis not present

## 2022-11-18 DIAGNOSIS — R1011 Right upper quadrant pain: Secondary | ICD-10-CM

## 2022-11-18 DIAGNOSIS — M545 Low back pain, unspecified: Secondary | ICD-10-CM | POA: Diagnosis not present

## 2022-11-21 ENCOUNTER — Ambulatory Visit (INDEPENDENT_AMBULATORY_CARE_PROVIDER_SITE_OTHER): Payer: PPO | Admitting: Adult Health

## 2022-11-21 ENCOUNTER — Encounter: Payer: Self-pay | Admitting: Adult Health

## 2022-11-21 ENCOUNTER — Encounter (INDEPENDENT_AMBULATORY_CARE_PROVIDER_SITE_OTHER): Payer: Self-pay | Admitting: Gastroenterology

## 2022-11-21 VITALS — BP 133/85 | HR 77 | Ht 63.0 in | Wt 136.0 lb

## 2022-11-21 DIAGNOSIS — Z90710 Acquired absence of both cervix and uterus: Secondary | ICD-10-CM | POA: Diagnosis not present

## 2022-11-21 DIAGNOSIS — R102 Pelvic and perineal pain: Secondary | ICD-10-CM

## 2022-11-21 DIAGNOSIS — N644 Mastodynia: Secondary | ICD-10-CM | POA: Diagnosis not present

## 2022-11-21 NOTE — Progress Notes (Signed)
  Subjective:     Patient ID: [name], female   DOB: 20-Aug-1960, 62 y.o.   MRN: [id number]  HPI [name] is a 62 year old white female, married, sp hysterectomy in complaining of pain in ovary area. And says breasts are tender.  PCP is Dr Allena Katz.  Review of Systems +pain on ovary area +breast tenderness Reviewed past medical,surgical, social and family history. Reviewed medications and allergies.     Objective:   Physical Exam BP 133/85 (BP Location: Right Arm, Patient Position: Sitting, Cuff Size: Normal)   Pulse 77   Ht 5\' 3"  (1.6 m)   Wt 136 lb (61.7 kg)   BMI 24.09 kg/m    Skin warm and dry,  Breasts:no dominate palpable mass, retraction or nipple discharge, has bilateral tenderness UOQ    Pelvic: external genitalia is normal in appearance no lesions, vagina: pale,urethra has no lesions or masses noted, cervix and uterus are absent, adnexa: no masses, + tenderness noted, more over bowel area. Bladder is non tender and no masses felt.  Fall risk is moderate Garment/textile technologist Visit from 11/21/2022 in [facility] Total Score 0        Upstream - 11/21/22 1544       Pregnancy Intention Screening   Does the patient want to become pregnant in the next year? N/A    Does the patient's partner want to become pregnant in the next year? N/A    Would the patient like to discuss contraceptive options today? N/A      Contraception Wrap Up   Current Method No Method - Other Reason    Reason for No Current Contraceptive Method at Intake ([facility] Only) Other   hysterectomy   End Method No Method - Other Reason    Contraception Counseling Provided No            Examination chaperoned by Tish RN   Assessment:     1. Pelvic pain +pelvic pain Will get pelvic US to assess ovaries, but I told her could be more bowel related  Pelvic US scheduled 12/07/22 at [facility] after her abdominal US per PCP - US PELVIC COMPLETE WITH  TRANSVAGINAL; Future  2. Breast tenderness +tender bilaterally UOQ, no masses  3. S/P hysterectomy     Plan:     Follow up prn

## 2022-11-22 DIAGNOSIS — M545 Low back pain, unspecified: Secondary | ICD-10-CM | POA: Diagnosis not present

## 2022-11-22 DIAGNOSIS — M6281 Muscle weakness (generalized): Secondary | ICD-10-CM | POA: Diagnosis not present

## 2022-11-22 DIAGNOSIS — S32019D Unspecified fracture of first lumbar vertebra, subsequent encounter for fracture with routine healing: Secondary | ICD-10-CM | POA: Diagnosis not present

## 2022-11-23 ENCOUNTER — Other Ambulatory Visit: Payer: Self-pay | Admitting: Family

## 2022-11-23 ENCOUNTER — Encounter: Payer: Self-pay | Admitting: Allergy & Immunology

## 2022-11-23 ENCOUNTER — Other Ambulatory Visit: Payer: Self-pay | Admitting: *Deleted

## 2022-11-23 ENCOUNTER — Telehealth: Payer: Self-pay | Admitting: Allergy & Immunology

## 2022-11-23 ENCOUNTER — Ambulatory Visit: Payer: PPO | Admitting: Allergy & Immunology

## 2022-11-23 ENCOUNTER — Other Ambulatory Visit: Payer: Self-pay

## 2022-11-23 VITALS — BP 120/76 | HR 83 | Temp 98.5°F | Resp 16 | Ht 63.0 in | Wt 136.4 lb

## 2022-11-23 DIAGNOSIS — J302 Other seasonal allergic rhinitis: Secondary | ICD-10-CM | POA: Diagnosis not present

## 2022-11-23 DIAGNOSIS — K219 Gastro-esophageal reflux disease without esophagitis: Secondary | ICD-10-CM

## 2022-11-23 DIAGNOSIS — R058 Other specified cough: Secondary | ICD-10-CM

## 2022-11-23 DIAGNOSIS — J3089 Other allergic rhinitis: Secondary | ICD-10-CM

## 2022-11-23 MED ORDER — IPRATROPIUM BROMIDE 0.03 % NA SOLN: 2.0000 | Freq: Two times a day (BID) | NASAL | 6 refills | Status: DC

## 2022-11-23 MED ORDER — MONTELUKAST SODIUM 10 MG PO TABS: 10.0000 mg | ORAL_TABLET | Freq: Every day | ORAL | 5 refills | Status: DC

## 2022-11-23 NOTE — Telephone Encounter (Signed)
[name] states she needs refills on Montelukast and would like it sent to [facility].

## 2022-11-23 NOTE — Telephone Encounter (Signed)
Patient was seen in office today for an office visit. I refilled her Montelukast and sent it to requested pharmacy through the office visit encounter.

## 2022-11-23 NOTE — Patient Instructions (Signed)
Seasonal and perennial allergic rhinitis(ragweed, indoor and outdoor molds, cat, dog) - Start Atrovent (ipratropium) one spray per nostril at night (can increase to two sprays if needed). - This should help you to dry out the postnasal drip and help with the coughing at night.  - Continue Claritin once a day as needed for runny nose or itching - Continue Singulair 10 mg once a day - May use sinus rinse as needed for nasal symptoms.  Use this prior to any medicated nasal sprays - May use saline gel for nasal dryness  Reflux - Continue omeprazole once a day as needed - Continue Tums as needed  Return in about 6 weeks (around 01/04/2023). You can have the follow up appointment with Dr. Dellis Anes or a Nurse Practicioner (our Nurse Practitioners are excellent and always have Physician oversight!).    Please inform us of any Emergency Department visits, hospitalizations, or changes in symptoms. Call us before going to the ED for breathing or allergy symptoms since we might be able to fit you in for a sick visit. Feel free to contact us anytime with any questions, problems, or concerns.  It was a pleasure to see you again today!  Websites that have reliable patient information: 1. American Academy of Asthma, Allergy, and Immunology: www.aaaai.org 2. Food Allergy Research and Education (FARE): foodallergy.org 3. Mothers of Asthmatics: http://www.asthmacommunitynetwork.org 4. American College of Allergy, Asthma, and Immunology: www.acaai.org   COVID-19 Vaccine Information can be found at: PodExchange.nl For questions related to vaccine distribution or appointments, please email [email or web address] or call [phone].   We realize that you might be concerned about having an allergic reaction to the COVID19 vaccines. To help with that concern, WE ARE OFFERING THE COVID19 VACCINES IN OUR OFFICE! Ask the front desk for dates!      "Like" [place] on Facebook and Instagram for our latest updates!      A healthy democracy works best when [employer] participate! Make sure you are registered to vote! If you have moved or changed any of your contact information, you will need to get this updated before voting!  In some cases, you MAY be able to register to vote online: [email or web address]

## 2022-11-23 NOTE — Progress Notes (Unsigned)
FOLLOW UP  Date of Service/Encounter:  11/23/22   Assessment:   Cough - never improved with asthma medication, likely secondary to postnasal drip   Seasonal and perennial allergic rhinitis (ragweed, indoor molds, outdoor molds, cat and dog)   Complicated past medical history, including a stroke as well as anxiety and depression    Plan/Recommendations:   Seasonal and perennial allergic rhinitis(ragweed, indoor and outdoor molds, cat, dog) - Start Atrovent (ipratropium) one spray per nostril at night (can increase to two sprays if needed). - This should help you to dry out the postnasal drip and help with the coughing at night.  - Continue Claritin once a day as needed for runny nose or itching - Continue Singulair 10 mg once a day - May use sinus rinse as needed for nasal symptoms.  Use this prior to any medicated nasal sprays - May use saline gel for nasal dryness  Reflux - Continue omeprazole once a day as needed - Continue Tums as needed  Return in about 6 weeks (around 01/04/2023). You can have the follow up appointment with Dr. Dellis Anes or a Nurse Practicioner (our Nurse Practitioners are excellent and always have Physician oversight!).    Subjective:   [name] is a 62 y.o. female presenting today for follow up of  Chief Complaint  Patient presents with   Cough    Ear pain, headaches, and constant cough    Headache    Has been having bad headaches due to her arthritis injections    Allergic Rhinitis     Uses the nasal rinses for drainage and that at times causes coughing. - Saw ENT end of last year.     Emileigh L Beeney has a history of the following: Patient Active Problem List   Diagnosis Date Noted   Breast tenderness 11/21/2022   Pelvic pain 11/21/2022   S/P hysterectomy 11/21/2022   Chronic fatigue 11/09/2022   Family history of breast cancer 11/08/2022   Osteoporosis with current pathological fracture 08/29/2022   Intestinal methanogen  overgrowth 04/28/2022   History of total abdominal hysterectomy 03/28/2022   Dystrophia unguium 02/01/2022   Lumbar compression fracture (HCC) 12/23/2021   Lumbar spondylosis 11/04/2021   Encounter for general adult medical examination with abnormal findings 07/07/2021   Gastric polyp 05/31/2021   NASH (nonalcoholic steatohepatitis) 04/01/2021   Dysphagia 04/01/2021   Stage 3a chronic kidney disease (HCC) 03/16/2021   Arthritis 01/31/2021   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   History of hemorrhagic stroke with residual hemiplegia (HCC) 06/15/2020   Urinary incontinence 06/15/2020   Asthma due to seasonal allergies 06/15/2020   Vaginal itching 03/16/2020   S/P right knee arthroscopy 07/13/17 07/20/2017   Derangement of posterior horn of medial meniscus of right knee    Chondromalacia, patella, right    IBS (irritable bowel syndrome) 05/11/2015   Gait instability 01/06/2015   DDD (degenerative disc disease), lumbar 02/18/2014   Postmenopausal atrophic vaginitis 11/01/2013   Lipoma of abdominal wall 08/14/2013   Back pain 05/29/2013   Paresthesia of foot 02/12/2013   Hypertension    Depression 11/17/2011   Insomnia 11/17/2011   Mixed hyperlipidemia 09/20/2011   Anxiety 09/20/2011    History obtained from: chart review and patient.  [name] is a 62 y.o. female presenting for a follow up visit.  She was last seen in July 2023 by Nehemiah Settle.  At that time, her cough had resolved.  This tended to happen with her, as she was off and on asthma  medications.  For her rhinitis, we continue with Claritin as well as Singulair and Flonase.  Her reflux was controlled with omeprazole and Tums.  Since last visit, she has mostly done well. Since the last time we saw her, she went to see Dr. Suszanne Conners. She had a good experience with him. She had to have surgery to open up her sinuses. This was done sometime since the time she saw Chrissie. She felt that she was able to breathe better.  She is on two new  medications. She was diagnosed with osteoporosis. She was placed on a couple of medications, both of which can cause headaches. She seldom sneezes. She will use nasal saline to rinse out her nose. Then before going out of the house in the morning. She will use a squirt of Flonase. She has only ever been on the Flonase.   But she starts coughing a lot at night and throughout the night. This is a dry cough. She thinks that this has been going on for the last 3 months. This is mostly every single night.   She remains on the omeprazole. She also reports that she is on "Muslix" to help with the mucous at night. This was prescribed by Dr. Allena Katz, her PCP. I am not sure what medical this is. I think this is montelukast. She never really took any inhaler, despite our best efforts to try this. She literally returned samples unused and unopened to [place] in the past.   Otherwise, there have been no changes to [name] "[name]"'s past medical history, surgical history, family history, or social history.    Review of Systems  Constitutional: Negative.  Negative for fever, malaise/fatigue and weight loss.  HENT: Negative.  Negative for congestion, ear discharge and ear pain.        Positive for postnasal drip.   Eyes:  Negative for pain, discharge and redness.  Respiratory:  Positive for cough. Negative for sputum production, shortness of breath and wheezing.   Cardiovascular: Negative.  Negative for chest pain and palpitations.  Gastrointestinal:  Negative for abdominal pain, constipation, diarrhea, heartburn, nausea and vomiting.  Skin: Negative.  Negative for itching and rash.  Neurological:  Negative for dizziness and headaches.  Endo/Heme/Allergies:  Positive for environmental allergies. Does not bruise/bleed easily.       Objective:   Blood pressure 120/76, pulse 83, temperature 98.5 F (36.9 C), resp. rate 16, height 5\' 3"  (1.6 m), weight 136 lb 6.4 oz (61.9 kg), SpO2 94 %. Body mass index is  24.16 kg/m.    Physical Exam Vitals reviewed.  Constitutional:      Appearance: Nikie L Jasmer "[name]" is well-developed.  HENT:     Head: Normocephalic and atraumatic.     Right Ear: Tympanic membrane, ear canal and external ear normal. No drainage, swelling or tenderness. Tympanic membrane is not injected, scarred, erythematous, retracted or bulging.     Left Ear: Tympanic membrane, ear canal and external ear normal. No drainage, swelling or tenderness. Tympanic membrane is not injected, scarred, erythematous, retracted or bulging.     Nose: No nasal deformity, septal deviation, mucosal edema or rhinorrhea.     Right Turbinates: Not enlarged or swollen.     Left Turbinates: Not enlarged or swollen.     Right Sinus: No maxillary sinus tenderness or frontal sinus tenderness.     Left Sinus: No maxillary sinus tenderness or frontal sinus tenderness.     Mouth/Throat:     Mouth: Mucous membranes  are not pale and not dry.     Pharynx: Uvula midline.     Comments: Cobblestoning present in the posterior oropharynx.  Eyes:     General:        Right eye: No discharge.        Left eye: No discharge.     Conjunctiva/sclera: Conjunctivae normal.     Right eye: Right conjunctiva is not injected. No chemosis.    Left eye: Left conjunctiva is not injected. No chemosis.    Pupils: Pupils are equal, round, and reactive to light.  Cardiovascular:     Rate and Rhythm: Normal rate and regular rhythm.     Heart sounds: Normal heart sounds.  Pulmonary:     Effort: Pulmonary effort is normal. No tachypnea, accessory muscle usage or respiratory distress.     Breath sounds: Normal breath sounds. No wheezing, rhonchi or rales.     Comments: Moving air well in all lung fields. No increased work of breathing noted. Chest:     Chest wall: No tenderness.  Abdominal:     Tenderness: There is no abdominal tenderness. There is no guarding or rebound.  Lymphadenopathy:     Head:     Right side of head:  No submandibular, tonsillar or occipital adenopathy.     Left side of head: No submandibular, tonsillar or occipital adenopathy.     Cervical: No cervical adenopathy.  Skin:    Coloration: Skin is not pale.     Findings: No abrasion, erythema, petechiae or rash. Rash is not papular, urticarial or vesicular.  Neurological:     Mental Status: Lorey L Vanderberg "[name]" is alert.  Psychiatric:        Behavior: Behavior is cooperative.      Diagnostic studies: none       Malachi Bonds, MD  [facility]

## 2022-11-24 ENCOUNTER — Encounter: Payer: Self-pay | Admitting: Allergy & Immunology

## 2022-11-27 ENCOUNTER — Encounter: Payer: Self-pay | Admitting: Orthopaedic Surgery

## 2022-11-27 ENCOUNTER — Encounter: Payer: Self-pay | Admitting: Orthopedic Surgery

## 2022-11-29 DIAGNOSIS — S32019D Unspecified fracture of first lumbar vertebra, subsequent encounter for fracture with routine healing: Secondary | ICD-10-CM | POA: Diagnosis not present

## 2022-11-29 DIAGNOSIS — M6281 Muscle weakness (generalized): Secondary | ICD-10-CM | POA: Diagnosis not present

## 2022-11-29 DIAGNOSIS — M545 Low back pain, unspecified: Secondary | ICD-10-CM | POA: Diagnosis not present

## 2022-12-01 ENCOUNTER — Ambulatory Visit (HOSPITAL_COMMUNITY): Payer: PPO | Admitting: Physical Therapy

## 2022-12-02 DIAGNOSIS — K638211 Small intestinal bacterial overgrowth, hydrogen-subtype: Secondary | ICD-10-CM | POA: Diagnosis not present

## 2022-12-05 ENCOUNTER — Telehealth (HOSPITAL_COMMUNITY): Payer: PPO | Admitting: Psychiatry

## 2022-12-05 ENCOUNTER — Encounter (HOSPITAL_COMMUNITY): Payer: Self-pay

## 2022-12-05 NOTE — Telephone Encounter (Signed)
Okay , you didn't have an appt today

## 2022-12-06 ENCOUNTER — Encounter (INDEPENDENT_AMBULATORY_CARE_PROVIDER_SITE_OTHER): Payer: Self-pay | Admitting: Gastroenterology

## 2022-12-07 ENCOUNTER — Encounter (HOSPITAL_COMMUNITY): Payer: Self-pay

## 2022-12-07 ENCOUNTER — Ambulatory Visit (HOSPITAL_COMMUNITY): Payer: PPO

## 2022-12-07 ENCOUNTER — Ambulatory Visit (HOSPITAL_COMMUNITY): Admission: RE | Admit: 2022-12-07 | Payer: PPO | Source: Ambulatory Visit

## 2022-12-07 ENCOUNTER — Encounter (INDEPENDENT_AMBULATORY_CARE_PROVIDER_SITE_OTHER): Payer: Self-pay | Admitting: Gastroenterology

## 2022-12-08 ENCOUNTER — Encounter: Payer: Self-pay | Admitting: Internal Medicine

## 2022-12-08 ENCOUNTER — Other Ambulatory Visit (INDEPENDENT_AMBULATORY_CARE_PROVIDER_SITE_OTHER): Payer: Self-pay

## 2022-12-08 ENCOUNTER — Encounter (INDEPENDENT_AMBULATORY_CARE_PROVIDER_SITE_OTHER): Payer: Self-pay

## 2022-12-08 DIAGNOSIS — K638219 Small intestinal bacterial overgrowth, unspecified: Secondary | ICD-10-CM

## 2022-12-08 DIAGNOSIS — K582 Mixed irritable bowel syndrome: Secondary | ICD-10-CM

## 2022-12-08 MED ORDER — RIFAXIMIN 550 MG PO TABS
550.0000 mg | ORAL_TABLET | Freq: Three times a day (TID) | ORAL | 0 refills | Status: DC
Start: 2022-12-08 — End: 2023-02-14

## 2022-12-09 ENCOUNTER — Encounter (INDEPENDENT_AMBULATORY_CARE_PROVIDER_SITE_OTHER): Payer: Self-pay

## 2022-12-09 ENCOUNTER — Telehealth (INDEPENDENT_AMBULATORY_CARE_PROVIDER_SITE_OTHER): Payer: Self-pay

## 2022-12-09 NOTE — Telephone Encounter (Signed)
Xifaxan denied by insurance  There is a previously denied request on file, please contact [facility] for further assistance. No Number to [facility] to call. Will give patient samples.

## 2022-12-09 NOTE — Telephone Encounter (Signed)
I have placed # [phone] samples of Xifaxan at the front desk for the patient to pick up from the office. I have sent the patient a my chart message letting her know the medication was denied by her insurance and samples are at the front desk for pick up.

## 2022-12-12 ENCOUNTER — Encounter: Payer: Self-pay | Admitting: Orthopaedic Surgery

## 2022-12-12 ENCOUNTER — Encounter: Payer: Self-pay | Admitting: Orthopedic Surgery

## 2022-12-12 ENCOUNTER — Ambulatory Visit: Payer: PPO | Admitting: Orthopedic Surgery

## 2022-12-12 ENCOUNTER — Other Ambulatory Visit: Payer: Self-pay

## 2022-12-12 VITALS — BP 120/76 | Ht 63.0 in | Wt 136.0 lb

## 2022-12-12 DIAGNOSIS — M25561 Pain in right knee: Secondary | ICD-10-CM

## 2022-12-12 DIAGNOSIS — R7303 Prediabetes: Secondary | ICD-10-CM

## 2022-12-12 DIAGNOSIS — G8929 Other chronic pain: Secondary | ICD-10-CM

## 2022-12-12 MED ORDER — BLOOD GLUCOSE MONITORING SUPPL DEVI
1.0000 | Freq: Three times a day (TID) | 0 refills | Status: AC
Start: 2022-12-12 — End: ?

## 2022-12-12 MED ORDER — BLOOD GLUCOSE TEST VI STRP
1.0000 | ORAL_STRIP | Freq: Three times a day (TID) | 0 refills | Status: AC
Start: 2022-12-12 — End: 2023-01-11

## 2022-12-12 MED ORDER — LANCETS MISC. MISC
1.0000 | Freq: Three times a day (TID) | 0 refills | Status: AC
Start: 2022-12-12 — End: 2023-01-11

## 2022-12-12 MED ORDER — LANCET DEVICE MISC
1.0000 | Freq: Three times a day (TID) | 0 refills | Status: AC
Start: 2022-12-12 — End: 2023-01-11

## 2022-12-12 NOTE — Telephone Encounter (Signed)
Please take the antibiotics the full course and continue other medications as they were before. We have not changed anything else other that starting on antibiotic. If you feel you need a stool softener then yes you may take one as needed.

## 2022-12-12 NOTE — Progress Notes (Signed)
Chief Complaint  Patient presents with   Knee Pain    Had monovisc right knee 11/07/22 made the pain worse     [name] has come in after her Monovisc injection she says her right knee pain is worse  She has had an injection she cannot take NSAIDs she has ulcers she is taking Tylenol but is concerned about that because her liver enzymes go up we discussed the possibility of chronic pain management but she says she does not really want any more pain medication she just wants the knee to feel better  We have MRI evidence and arthroscopic evidence of her knee back in 2018 I saw chondromalacia in the lateral compartment a large central fissure at the median ridge of the patella  She has also had some intermittent issues with her back she had a fracture it healed but she still complains of some lower back pain I think she is having some radicular symptoms to her leg as she has pain and tenderness in the anterolateral compartment and lateral portion of the soft tissues external to the knee  Her knee moves very well there is no effusion there is no instability she does have tenderness globally around the knee joint  Last x-ray of the knee October 06, 2022 Alignment was normal alignment was normal joint spaces were maintained she had some mild decreased joint space in the patellofemoral joint laterally  Assessment and plan   Encounter Diagnosis  Name Primary?   Chronic pain of right knee Yes    chronic knee pain most likely has arthritis of the joint with some radicular pain from her back  I would not intervene anymore at this point.  I think she needs anti-inflammatories but she cannot take them  Other than injections I do not have anything else to give her  I think another injection in the right knee would be diagnostic and therapeutic and an MRI of the knee may be helpful

## 2022-12-19 ENCOUNTER — Encounter (INDEPENDENT_AMBULATORY_CARE_PROVIDER_SITE_OTHER): Payer: Self-pay | Admitting: Gastroenterology

## 2022-12-19 ENCOUNTER — Ambulatory Visit: Payer: PPO | Admitting: Orthopedic Surgery

## 2022-12-19 ENCOUNTER — Encounter: Payer: Self-pay | Admitting: Orthopedic Surgery

## 2022-12-20 ENCOUNTER — Ambulatory Visit (HOSPITAL_COMMUNITY)
Admission: RE | Admit: 2022-12-20 | Discharge: 2022-12-20 | Disposition: A | Discharge status: Home / Self Care | Payer: PPO | Source: Ambulatory Visit | Attending: Adult Health | Admitting: Adult Health

## 2022-12-20 ENCOUNTER — Ambulatory Visit (HOSPITAL_COMMUNITY)
Admission: RE | Admit: 2022-12-20 | Discharge: 2022-12-20 | Disposition: A | Discharge status: Home / Self Care | Payer: PPO | Source: Ambulatory Visit | Attending: Internal Medicine | Admitting: Internal Medicine

## 2022-12-20 DIAGNOSIS — K760 Fatty (change of) liver, not elsewhere classified: Secondary | ICD-10-CM | POA: Diagnosis not present

## 2022-12-20 DIAGNOSIS — R1011 Right upper quadrant pain: Secondary | ICD-10-CM | POA: Insufficient documentation

## 2022-12-20 DIAGNOSIS — Z9049 Acquired absence of other specified parts of digestive tract: Secondary | ICD-10-CM | POA: Diagnosis not present

## 2022-12-20 DIAGNOSIS — R102 Pelvic and perineal pain: Secondary | ICD-10-CM | POA: Diagnosis not present

## 2022-12-21 ENCOUNTER — Other Ambulatory Visit: Payer: Self-pay | Admitting: Adult Health

## 2022-12-21 MED ORDER — FLUCONAZOLE 150 MG PO TABS: ORAL_TABLET | ORAL | 1 refills | Status: DC

## 2022-12-21 NOTE — Progress Notes (Signed)
Rx diflucan.  

## 2022-12-23 ENCOUNTER — Encounter: Payer: Self-pay | Admitting: Internal Medicine

## 2022-12-23 ENCOUNTER — Encounter: Payer: Self-pay | Admitting: Orthopedic Surgery

## 2022-12-24 ENCOUNTER — Encounter: Payer: Self-pay | Admitting: Orthopedic Surgery

## 2022-12-25 ENCOUNTER — Encounter: Payer: Self-pay | Admitting: Orthopaedic Surgery

## 2022-12-26 ENCOUNTER — Encounter (INDEPENDENT_AMBULATORY_CARE_PROVIDER_SITE_OTHER): Payer: Self-pay | Admitting: Gastroenterology

## 2022-12-29 DIAGNOSIS — M79671 Pain in right foot: Secondary | ICD-10-CM | POA: Diagnosis not present

## 2022-12-29 DIAGNOSIS — M76821 Posterior tibial tendinitis, right leg: Secondary | ICD-10-CM | POA: Diagnosis not present

## 2023-01-02 ENCOUNTER — Other Ambulatory Visit: Payer: Self-pay | Admitting: Internal Medicine

## 2023-01-02 DIAGNOSIS — I10 Essential (primary) hypertension: Secondary | ICD-10-CM

## 2023-01-04 ENCOUNTER — Encounter: Payer: Self-pay | Admitting: Internal Medicine

## 2023-01-04 ENCOUNTER — Ambulatory Visit: Payer: PPO | Admitting: Allergy & Immunology

## 2023-01-09 ENCOUNTER — Ambulatory Visit: Payer: PPO | Admitting: Orthopedic Surgery

## 2023-01-12 ENCOUNTER — Encounter: Payer: Self-pay | Admitting: Orthopaedic Surgery

## 2023-01-15 ENCOUNTER — Encounter: Payer: Self-pay | Admitting: Internal Medicine

## 2023-01-15 ENCOUNTER — Encounter: Payer: Self-pay | Admitting: Orthopaedic Surgery

## 2023-01-16 ENCOUNTER — Other Ambulatory Visit: Payer: Self-pay | Admitting: Internal Medicine

## 2023-01-16 DIAGNOSIS — M62838 Other muscle spasm: Secondary | ICD-10-CM

## 2023-01-16 NOTE — Telephone Encounter (Signed)
scheduled

## 2023-01-16 NOTE — Telephone Encounter (Signed)
Responding to patient in another My Chart message.

## 2023-01-17 ENCOUNTER — Encounter (HOSPITAL_COMMUNITY): Payer: Self-pay | Admitting: Psychiatry

## 2023-01-17 ENCOUNTER — Encounter: Payer: Self-pay | Admitting: Internal Medicine

## 2023-01-17 ENCOUNTER — Telehealth (INDEPENDENT_AMBULATORY_CARE_PROVIDER_SITE_OTHER): Payer: PPO | Admitting: Psychiatry

## 2023-01-17 DIAGNOSIS — F988 Other specified behavioral and emotional disorders with onset usually occurring in childhood and adolescence: Secondary | ICD-10-CM

## 2023-01-17 DIAGNOSIS — F332 Major depressive disorder, recurrent severe without psychotic features: Secondary | ICD-10-CM

## 2023-01-17 DIAGNOSIS — R413 Other amnesia: Secondary | ICD-10-CM

## 2023-01-17 MED ORDER — VENLAFAXINE HCL ER 150 MG PO CP24: 150.0000 mg | ORAL_CAPSULE | Freq: Every day | ORAL | 2 refills | Status: DC

## 2023-01-17 MED ORDER — METHYLPHENIDATE HCL ER (OSM) 36 MG PO TBCR: 36.0000 mg | EXTENDED_RELEASE_TABLET | Freq: Every day | ORAL | 0 refills | Status: DC

## 2023-01-17 MED ORDER — METHYLPHENIDATE HCL ER (OSM) 36 MG PO TBCR: 36.0000 mg | EXTENDED_RELEASE_TABLET | ORAL | 0 refills | Status: DC

## 2023-01-17 MED ORDER — TRAZODONE HCL 100 MG PO TABS: ORAL_TABLET | ORAL | 2 refills | Status: DC

## 2023-01-17 MED ORDER — ALPRAZOLAM 1 MG PO TABS: 1.0000 mg | ORAL_TABLET | Freq: Three times a day (TID) | ORAL | 2 refills | Status: DC

## 2023-01-17 NOTE — Progress Notes (Signed)
Virtual Visit via Telephone Note  I connected with [name] on 01/17/23 at 11:20 AM EDT by telephone and verified that I am speaking with the correct person using two identifiers.  Location: Patient: home Provider: office   I discussed the limitations, risks, security and privacy concerns of performing an evaluation and management service by telephone and the availability of in person appointments. I also discussed with the patient that there may be a patient responsible charge related to this service. The patient expressed understanding and agreed to proceed.      I discussed the assessment and treatment plan with the patient. The patient was provided an opportunity to ask questions and all were answered. The patient agreed with the plan and demonstrated an understanding of the instructions.   The patient was advised to call back or seek an in-person evaluation if the symptoms worsen or if the condition fails to improve as anticipated.  I provided 15 minutes of non-face-to-face time during this encounter.   [name], MD  [facility] MD/PA/NP OP Progress Note  01/17/2023 11:39 AM [name]  MRN:  [id number]  Chief Complaint:  Chief Complaint  Patient presents with   Anxiety   Depression   ADD   Follow-up   HPI:  T his patient is a 62 year old married white female who lives with her husband in [place]. She has no children. 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   The patient returns for follow-up after 2 months.  She states that lately she has been having a lot of periumbilical pain.  She is convinced that this is from her "fatty liver."  She has been messaging her primary care physician and her GI physician numerous times regarding this.  Neither agrees that this could be coming from mild fatty liver disease.  Her PCP thinks that this is probably from osteoarthritic pain and is wanting to prescribe gabapentin.  She  asked me my opinion and I think it is worth a try although it can cause sedation and she is needs to start low and take it at bedtime.  She may need to cut down the trazodone when she takes this.  The patient mistakenly stopped the Effexor XR as she misunderstood my directions last time.  We had stopped the methylphenidate.  She is now on Concerta which is helping her focus.  She has been more anxious lately and I suggested going back on the Effexor XR and she agreed.  She is sleeping fairly well and denies any thoughts of suicide or self-harm Visit Diagnosis:    ICD-10-CM   1. Major depressive disorder, recurrent, severe without psychotic features (HCC)  F33.2     2. ADD (attention deficit disorder) without hyperactivity  F98.8     3. Memory deficit  R41.3       Past Psychiatric History: Past outpatient treatment for depression  Past Medical History:  Past Medical History:  Diagnosis Date   Allergy    grass, dust , mold   Anxiety    Arthritis    Asthma due to seasonal allergies 06/15/2020   Bipolar disorder (HCC)    Carpal tunnel syndrome    Bilateral   Chest pain 09/2011   Cardiac cath-normal coronaries   Constipation    Depression    Difficulty urinating 05/31/2013   Elevated LFTs 12/16/2013   Encounter for general adult medical examination with abnormal findings 07/07/2021   GERD (  gastroesophageal reflux disease)    History of kidney stones    Hyperlipemia    Hyperlipidemia    Hypertension    Mild; provoked by stress and anxiety   IBS (irritable bowel syndrome)    Intracerebral bleed (HCC)    No aneurysm; followed by Dr. Athena Masse replaced    "lense transplant" 2022; pt states lense don't dilate or constrict   Loss of weight 01/06/2015   Osteoporosis    Stage 3a chronic kidney disease (HCC) 03/16/2021   Stroke (HCC) 1999   hemorrhagic stroke; weakness of left side    Past Surgical History:  Procedure Laterality Date   BIOPSY  04/16/2021   Procedure:  BIOPSY;  Surgeon: Marguerita Merles, Reuel Boom, MD;  [facility]: [facility];  Service: Gastroenterology;;  small, bowel, esophageal(proximal and distal);   BRAIN SURGERY  1999   to remove blood clot after stroke    CARDIAC CATHETERIZATION  2016   CERVICAL FUSION     CHOLECYSTECTOMY N/A 10/14/2014   Procedure: LAPAROSCOPIC CHOLECYSTECTOMY WITH INTRAOPERATIVE CHOLANGIOGRAM;  Surgeon: Avel Peace, MD;  Location: [facility];  Service: General;  Laterality: N/A;   CHONDROPLASTY Right 07/13/2017   Procedure: CHONDROPLASTY of patella;  Surgeon: Vickki Hearing, MD;  Location: [facility];  Service: Orthopedics;  Laterality: Right;   ESOPHAGEAL DILATION N/A 04/16/2021   Procedure: ESOPHAGEAL DILATION;  Surgeon: Dolores Frame, MD;  [facility]: [facility];  Service: Gastroenterology;  Laterality: N/A;   ESOPHAGOGASTRODUODENOSCOPY (EGD) WITH PROPOFOL N/A 04/16/2021   Procedure: ESOPHAGOGASTRODUODENOSCOPY (EGD) WITH PROPOFOL;  Surgeon: Dolores Frame, MD;  [facility]: [facility];  Service: Gastroenterology;  Laterality: N/A;  1:35, pt knows to arrive at 9:45   EUS N/A 08/21/2015   Procedure: ESOPHAGEAL ENDOSCOPIC ULTRASOUND (EUS) RADIAL;  Surgeon: [name], MD;  Location: [facility];  Service: Endoscopy;  Laterality: N/A;   KNEE ARTHROSCOPY WITH MEDIAL MENISECTOMY Right 07/13/2017   Procedure: KNEE ARTHROSCOPY WITH PARTIAL MEDIAL MENISECTOMY;  Surgeon: Vickki Hearing, MD;  Location: [facility];  Service: Orthopedics;  Laterality: Right;   LEFT HEART CATHETERIZATION WITH CORONARY ANGIOGRAM N/A 09/23/2011   Procedure: LEFT HEART CATHETERIZATION WITH CORONARY ANGIOGRAM;  Surgeon: Vesta Mixer, MD;  Location: [facility] LAB;  Service: Cardiovascular;  Laterality: N/A;   LIPOMA EXCISION Left 11/18/2013   Procedure: EXCISION OF SOFT TISSUE MASS-LEFT THIGH;  Surgeon: Dalia Heading, MD;  Location: [facility];  Service: General;  Laterality: Left;   NASAL SEPTOPLASTY W/ TURBINOPLASTY  Bilateral 08/30/2021   Procedure: NASAL SEPTOPLASTY WITH BILATERAL TURBINATE REDUCTION;  Surgeon: Newman Pies, MD;  Location: [facility];  Service: ENT;  Laterality: Bilateral;   POLYPECTOMY  04/16/2021   Procedure: POLYPECTOMY;  Surgeon: Dolores Frame, MD;  [facility];  Service: Gastroenterology;;  gastric   RECTOCELE REPAIR     x2   RECTOCELE REPAIR N/A 04/04/2017   Procedure: POSTERIOR REPAIR (RECTOCELE);  Surgeon: Tilda Burrow, MD;  Location: [facility];  Service: Gynecology;  Laterality: N/A;   TOTAL ABDOMINAL HYSTERECTOMY      Family Psychiatric History: See below  Family History:  Family History  Problem Relation Age of Onset   Cancer Mother        breast    Hypertension Mother    Hyperlipidemia Mother    Depression Mother    Anxiety disorder Mother    COPD Mother    Arthritis Mother        rheumatoid   Drug abuse Sister  Coronary artery disease Paternal Grandfather    Coronary artery disease Paternal Uncle    Depression Cousin    Drug abuse Cousin     Social History:  Social History   Socioeconomic History   Marital status: Married    Spouse name: Dorene Sorrow   Number of children: 0   Years of education: HS   Highest education level: Not on file  Occupational History   Occupation: unemployed    Comment: pending disability  Tobacco Use   Smoking status: Former    Current packs/day: 0.00    Average packs/day: 1 pack/day for 19.0 years (19.0 ttl pk-yrs)    Types: Cigarettes    Start date: 09/02/1978    Quit date: 09/01/1997    Years since quitting: 25.3    Passive exposure: Past   Smokeless tobacco: Never   Tobacco comments:    Quit smoking 1999 , previous 20 pack years  Vaping Use   Vaping status: Never Used  Substance and Sexual Activity   Alcohol use: Yes    Comment: 1 drink every other week   Drug use: No   Sexual activity: Not Currently    Partners: Male    Birth control/protection: Surgical    Comment: hyst    Other Topics Concern   Not on file  Social History Narrative   Currently unable to work   Lives in [place]   Married   Patient drinks 1 cup of caffeine daily.   Patient is right handed.    Joined the [employer] to get more exercise   Social Determinants of Health   Financial Resource Strain: Low Risk  (03/29/2022)   Overall Financial Resource Strain (CARDIA)    Difficulty of Paying Living Expenses: Not hard at all  Food Insecurity: No Food Insecurity (03/29/2022)   Hunger Vital Sign    Worried About Running Out of Food in the Last Year: Never true    Ran Out of Food in the Last Year: Never true  Transportation Needs: No Transportation Needs (03/29/2022)   PRAPARE - Administrator, Civil Service (Medical): No    Lack of Transportation (Non-Medical): No  Physical Activity: Inactive (03/29/2022)   Exercise Vital Sign    Days of Exercise per Week: 0 days    Minutes of Exercise per Session: 0 min  Stress: Stress Concern Present (03/29/2022)   [employer] of Occupational Health - Occupational Stress Questionnaire    Feeling of Stress : To some extent  Social Connections: Moderately Isolated (03/29/2022)   Social Connection and Isolation Panel [NHANES]    Frequency of Communication with Friends and Family: Twice a week    Frequency of Social Gatherings with Friends and Family: Once a week    Attends Religious Services: Never    Database administrator or Organizations: No    Attends Banker Meetings: Never    Marital Status: Married    Allergies:  Allergies  Allergen Reactions   Morphine And Codeine Hives   Promethazine Hcl Other (See Comments)    Causes patient to become Hyper    Metabolic Disorder Labs: Lab Results  Component Value Date   HGBA1C 5.8 (H) 08/25/2022   MPG 117 (H) 01/06/2015   MPG 117 (H) 02/17/2014   No results found for: "PROLACTIN" Lab Results  Component Value Date   CHOL 138 08/25/2022   TRIG 133 08/25/2022   HDL 63  08/25/2022   CHOLHDL 2.2 08/25/2022   VLDL 26 09/16/2015  LDLCALC 52 08/25/2022   LDLCALC 98 05/12/2021   Lab Results  Component Value Date   TSH 1.720 08/25/2022   TSH 2.430 05/12/2021    Therapeutic Level Labs: No results found for: "LITHIUM" No results found for: "VALPROATE" No results found for: "CBMZ"  Current Medications: Current Outpatient Medications  Medication Sig Dispense Refill   methylphenidate (CONCERTA) 36 MG PO CR tablet Take 1 tablet (36 mg total) by mouth daily. 30 tablet 0   methylphenidate (CONCERTA) 36 MG PO CR tablet Take 1 tablet (36 mg total) by mouth every morning. 30 tablet 0   acetaminophen (TYLENOL 8 HOUR) 650 MG CR tablet Take 650 mg by mouth every 8 (eight) hours.     acetaminophen (TYLENOL) 500 MG tablet Take 500 mg by mouth. As needed     ALPRAZolam (XANAX) 1 MG tablet Take 1 tablet (1 mg total) by mouth 3 (three) times daily. 60 tablet 2   atorvastatin (LIPITOR) 40 MG tablet Take 1 tablet (40 mg total) by mouth daily. 90 tablet 3   Blood Glucose Monitoring Suppl DEVI 1 each by Does not apply route in the morning, at noon, and at bedtime. May substitute to any manufacturer covered by patient's insurance. 1 each 0   Calcium 500-125 MG-UNIT TABS Take 1 tablet by mouth daily with supper.     Carboxymethylcellulose Sod PF 0.5 % SOLN Place 1 drop into both eyes daily as needed (dry eyes).     cetirizine (ZYRTEC) 10 MG tablet Take 10 mg by mouth daily.     cyclobenzaprine (FLEXERIL) 5 MG tablet TAKE 1 TABLET BY MOUTH THREE TIMES DAILY AS NEEDED FOR MUSCLE SPASMS. 90 tablet 0   diclofenac Sodium (VOLTAREN) 1 % GEL Apply 1 application topically daily as needed (pain).     estradiol (ESTRACE) 0.1 MG/GM vaginal cream Place 0.5 g vaginally 2 (two) times a week. Place 0.5g nightly for two weeks then twice a week after 30 g 11   ezetimibe (ZETIA) 10 MG tablet Take 1 tablet (10 mg total) by mouth daily. 90 tablet 3   famotidine (PEPCID) 20 MG tablet Take 20 mg by  mouth 2 (two) times daily. As needed     fluconazole (DIFLUCAN) 150 MG tablet Take 1 now and 1 in 3 days 2 tablet 1   ipratropium (ATROVENT) 0.03 % nasal spray Place 2 sprays into both nostrils every 12 (twelve) hours. 30 mL 6   lidocaine-prilocaine (EMLA) cream Apply 1 Application topically at bedtime.     lisinopril (ZESTRIL) 20 MG tablet TAKE 1 TABLET BY MOUTH DAILY. 90 tablet 0   methylphenidate 36 MG PO CR tablet Take 36 mg by mouth daily.     montelukast (SINGULAIR) 10 MG tablet TAKE (1) TABLET BY MOUTH AT BEDTIME. 90 tablet 0   montelukast (SINGULAIR) 10 MG tablet Take 1 tablet (10 mg total) by mouth at bedtime. 30 tablet 5   Multiple Vitamin (MULITIVITAMIN WITH MINERALS) TABS Take 1 tablet by mouth daily with breakfast.     Nystatin POWD by Does not apply route. As needed.     Olopatadine HCl 0.2 % SOLN Place 1 drop into both eyes in the morning and at bedtime.     Omega-3 Fatty Acids (FISH OIL) 1000 MG CAPS Take 2,000 mg by mouth.     omeprazole (PRILOSEC) 40 MG capsule TAKE 1 CAPSULE BY MOUTH TWICE DAILY 60 capsule 0   OVER THE COUNTER MEDICATION Milk thistle once per day.  Mushrooms once daily.  Vit K 2 once per day.  Beet root gummies daily     oxybutynin (DITROPAN-XL) 10 MG 24 hr tablet Take 1 tablet (10 mg total) by mouth at bedtime. 90 tablet 1   Probiotic Product (TRUBIOTICS PO) Take 1 capsule by mouth daily. Takes 25 cfu daily.     pyridOXINE (VITAMIN B-6) 100 MG tablet Take 100 mg by mouth daily.     rifaximin (XIFAXAN) 550 MG TABS tablet Take 1 tablet (550 mg total) by mouth 3 (three) times daily. 42 tablet 0   traZODone (DESYREL) 100 MG tablet TAKE (2) TABLETS BY MOUTH AT BEDTIME. 60 tablet 2   TYMLOS 3120 MCG/1.56ML SOPN One qhs     venlafaxine XR (EFFEXOR XR) 150 MG 24 hr capsule Take 1 capsule (150 mg total) by mouth daily with breakfast. 90 capsule 2   No current facility-administered medications for this visit.     Musculoskeletal: Strength & Muscle Tone:  na Gait & Station: na Patient leans: N/A  Psychiatric Specialty Exam: Review of Systems  Gastrointestinal:  Positive for abdominal pain.  Musculoskeletal:  Positive for arthralgias and back pain.  Psychiatric/Behavioral:  The patient is nervous/anxious.   All other systems reviewed and are negative.   There were no vitals taken for this visit.There is no height or weight on file to calculate BMI.  General Appearance: NA  Eye Contact:  NA  Speech:  Clear and Coherent  Volume:  Normal  Mood:  Anxious  Affect:  NA  Thought Process:  Goal Directed  Orientation:  Full (Time, Place, and Person)  Thought Content: Rumination   Suicidal Thoughts:  No  Homicidal Thoughts:  No  Memory: Immediate good, recent fair  Judgement:  Fair  Insight:  Fair  Psychomotor Activity:  Decreased  Concentration:  Concentration: Good and Attention Span: Good  Recall:  Fair  Fund of Knowledge: Good  Language: Good  Akathisia:  No  Handed:  Right  AIMS (if indicated): not done  Assets:  Communication Skills Desire for Improvement Resilience Social Support  ADL's:  Intact  Cognition: WNL  Sleep:  Good   Screenings: AUDIT    Flowsheet Row Clinical Support from 01/06/2021 in [facility]  Alcohol Use Disorder Identification Test Final Score (AUDIT) 4      GAD-7    Flowsheet Row Office Visit from 08/29/2022 in [facility]  Total GAD-7 Score 6      MDI    Flowsheet Row Office Visit from 01/22/2016 in [facility]  Total Score (max 50) 34      Mini-Mental    Flowsheet Row Office Visit from 02/13/2015 in [facility]  Total Score (max 30 points ) 26      PHQ2-9    Flowsheet Row Office Visit from 11/21/2022 in [facility] Office Visit from 08/29/2022 in [facility] Office Visit from 05/09/2022 in [facility] Clinical Support from 03/29/2022 in [facility] Video Visit from 02/15/2022 in [facility] at [facility] Total Score 0 2 1 1  0  PHQ-9 Total Score -- 5 -- -- --      SBQ-R    Flowsheet Row Office Visit from 01/22/2016 in [facility] at [facility] Total Score 14.1      Flowsheet Row ED from 04/15/2022 in  [facility] at [facility] Video Visit from 02/15/2022 in [facility] at [place] Video Visit from 11/22/2021 in [facility] at [place]  C-SSRS RISK CATEGORY No Risk No Risk Error: Q3, 4, or 5 should not be populated when Q2 is No        Assessment and Plan: This patient is a 62 year old female with a history of depression anxiety ADD chronic pain and chronic fatigue.  She is focusing better with the Concerta 36 mg every morning so this will be continued.  She will get back on the Effexor XR 150 mg every morning for depression and anxiety.  She will continue trazodone 200 mg at bedtime for sleep although this may need to be cut back to 100 mg if she goes on gabapentin at night.  She can continue Xanax 1 mg up to 3 times daily for anxiety.  She will return to see me in 3 months  Collaboration of Care: Collaboration of Care: Primary Care Provider AEB notes are shared with PCP on the epic system  Patient/Guardian was advised Release of Information must be obtained prior to any record release in order to collaborate their care with an outside provider. Patient/Guardian was advised if they have not already done so to contact the registration department to sign all necessary forms in order for [place] to release information regarding their care.   Consent: Patient/Guardian gives verbal consent for treatment and assignment of benefits for services provided during this visit. Patient/Guardian  expressed understanding and agreed to proceed.    [name], MD 01/17/2023, 11:39 AM

## 2023-01-18 ENCOUNTER — Other Ambulatory Visit: Payer: Self-pay | Admitting: Internal Medicine

## 2023-01-18 ENCOUNTER — Ambulatory Visit: Payer: PPO | Admitting: Internal Medicine

## 2023-01-18 ENCOUNTER — Encounter: Payer: Self-pay | Admitting: Internal Medicine

## 2023-01-18 DIAGNOSIS — M47816 Spondylosis without myelopathy or radiculopathy, lumbar region: Secondary | ICD-10-CM

## 2023-01-18 MED ORDER — GABAPENTIN 100 MG PO CAPS
100.0000 mg | ORAL_CAPSULE | Freq: Every day | ORAL | 0 refills | Status: DC
Start: 2023-01-18 — End: 2023-01-24

## 2023-01-19 DIAGNOSIS — R5383 Other fatigue: Secondary | ICD-10-CM | POA: Diagnosis not present

## 2023-01-19 DIAGNOSIS — M810 Age-related osteoporosis without current pathological fracture: Secondary | ICD-10-CM | POA: Diagnosis not present

## 2023-01-20 ENCOUNTER — Encounter: Payer: Self-pay | Admitting: Internal Medicine

## 2023-01-24 ENCOUNTER — Other Ambulatory Visit: Payer: Self-pay | Admitting: Internal Medicine

## 2023-01-24 DIAGNOSIS — M47816 Spondylosis without myelopathy or radiculopathy, lumbar region: Secondary | ICD-10-CM

## 2023-01-24 MED ORDER — GABAPENTIN 100 MG PO CAPS: 100.0000 mg | ORAL_CAPSULE | Freq: Two times a day (BID) | ORAL | 0 refills | Status: DC

## 2023-01-30 ENCOUNTER — Other Ambulatory Visit (HOSPITAL_COMMUNITY): Payer: Self-pay | Admitting: Adult Health

## 2023-01-30 ENCOUNTER — Encounter (HOSPITAL_COMMUNITY): Payer: Self-pay

## 2023-01-30 ENCOUNTER — Other Ambulatory Visit (HOSPITAL_COMMUNITY): Payer: Self-pay | Admitting: Psychiatry

## 2023-01-30 DIAGNOSIS — Z1231 Encounter for screening mammogram for malignant neoplasm of breast: Secondary | ICD-10-CM

## 2023-02-01 ENCOUNTER — Encounter: Payer: Self-pay | Admitting: Orthopedic Surgery

## 2023-02-01 ENCOUNTER — Encounter: Payer: Self-pay | Admitting: Orthopaedic Surgery

## 2023-02-03 ENCOUNTER — Encounter: Payer: Self-pay | Admitting: Internal Medicine

## 2023-02-06 ENCOUNTER — Other Ambulatory Visit: Payer: Self-pay | Admitting: Internal Medicine

## 2023-02-06 DIAGNOSIS — M47816 Spondylosis without myelopathy or radiculopathy, lumbar region: Secondary | ICD-10-CM

## 2023-02-06 MED ORDER — PREGABALIN 25 MG PO CAPS
25.0000 mg | ORAL_CAPSULE | Freq: Every day | ORAL | 0 refills | Status: DC
Start: 2023-02-06 — End: 2023-03-14

## 2023-02-10 DIAGNOSIS — E782 Mixed hyperlipidemia: Secondary | ICD-10-CM | POA: Diagnosis not present

## 2023-02-10 DIAGNOSIS — Z803 Family history of malignant neoplasm of breast: Secondary | ICD-10-CM | POA: Diagnosis not present

## 2023-02-10 DIAGNOSIS — I10 Essential (primary) hypertension: Secondary | ICD-10-CM | POA: Diagnosis not present

## 2023-02-10 DIAGNOSIS — N1831 Chronic kidney disease, stage 3a: Secondary | ICD-10-CM | POA: Diagnosis not present

## 2023-02-10 DIAGNOSIS — R5382 Chronic fatigue, unspecified: Secondary | ICD-10-CM | POA: Diagnosis not present

## 2023-02-10 DIAGNOSIS — R7303 Prediabetes: Secondary | ICD-10-CM | POA: Diagnosis not present

## 2023-02-12 ENCOUNTER — Other Ambulatory Visit: Payer: Self-pay

## 2023-02-12 ENCOUNTER — Emergency Department (HOSPITAL_COMMUNITY)
Admission: EM | Admit: 2023-02-12 | Discharge: 2023-02-12 | Disposition: A | Discharge status: Home / Self Care | Payer: PPO | Attending: Emergency Medicine | Admitting: Emergency Medicine

## 2023-02-12 ENCOUNTER — Emergency Department (HOSPITAL_COMMUNITY): Payer: PPO

## 2023-02-12 ENCOUNTER — Encounter (INDEPENDENT_AMBULATORY_CARE_PROVIDER_SITE_OTHER): Payer: Self-pay | Admitting: Gastroenterology

## 2023-02-12 ENCOUNTER — Encounter: Payer: Self-pay | Admitting: Orthopaedic Surgery

## 2023-02-12 ENCOUNTER — Encounter (HOSPITAL_COMMUNITY): Payer: Self-pay

## 2023-02-12 DIAGNOSIS — N183 Chronic kidney disease, stage 3 unspecified: Secondary | ICD-10-CM | POA: Diagnosis not present

## 2023-02-12 DIAGNOSIS — M25531 Pain in right wrist: Secondary | ICD-10-CM | POA: Diagnosis not present

## 2023-02-12 DIAGNOSIS — M19032 Primary osteoarthritis, left wrist: Secondary | ICD-10-CM | POA: Diagnosis not present

## 2023-02-12 DIAGNOSIS — W010XXA Fall on same level from slipping, tripping and stumbling without subsequent striking against object, initial encounter: Secondary | ICD-10-CM | POA: Insufficient documentation

## 2023-02-12 MED ORDER — ACETAMINOPHEN 325 MG PO TABS
650.0000 mg | ORAL_TABLET | Freq: Once | ORAL | Status: DC
  Filled 2023-02-12: qty 2

## 2023-02-12 MED ORDER — HYDROCODONE-ACETAMINOPHEN 5-325 MG PO TABS
1.0000 | ORAL_TABLET | Freq: Once | ORAL | Status: AC
  Administered 2023-02-12: 1 via ORAL
  Filled 2023-02-12: qty 1

## 2023-02-12 NOTE — Discharge Instructions (Addendum)
It was a pleasure taking care of you today.  You were seen for a wrist pain.  Your x-rays are normal there is not any significant swelling.  We are putting you in a brace since you have some tenderness over your scaphoid and you need to follow-up with orthopedics in about a week for repeat exam.  Come back for new or worsening symptoms.

## 2023-02-12 NOTE — ED Triage Notes (Signed)
"  Was pulling weeds and tripped and fell and caught myself with my right wrist" per pt

## 2023-02-12 NOTE — ED Provider Notes (Signed)
[place] EMERGENCY DEPARTMENT AT [facility] Provider Note   CSN: [id number] Arrival date & time: 02/12/23  1456     History  Chief Complaint  Patient presents with   Wrist Pain    [name] is a 62 y.o. female.  He has PMH of arthritis, stage III CKD, NASH, stroke at age 62 with residual mild hemiplegia.  Patient presents today for right wrist pain to the radial aspect after fall on outstretched hand when she tripped over a brick in her garden.  She has range of motion intact but still having a lot of pain.  No swelling, no numbness or tingling.  No pain at the elbow or shoulder.  No head injury.  Wrist Pain       Home Medications Prior to Admission medications   Medication Sig Start Date End Date Taking? Authorizing Provider  acetaminophen (TYLENOL 8 HOUR) 650 MG CR tablet Take 650 mg by mouth every 8 (eight) hours.    [provider]  acetaminophen (TYLENOL) 500 MG tablet Take 500 mg by mouth. As needed    [provider]  ALPRAZolam Prudy Feeler) 1 MG tablet Take 1 tablet (1 mg total) by mouth 3 (three) times daily. 01/17/23   Myrlene Broker, MD  atorvastatin (LIPITOR) 40 MG tablet Take 1 tablet (40 mg total) by mouth daily. 05/09/22   Anabel Halon, MD  Blood Glucose Monitoring Suppl DEVI 1 each by Does not apply route in the morning, at noon, and at bedtime. May substitute to any manufacturer covered by patient's insurance. 12/12/22   Anabel Halon, MD  Calcium 500-125 MG-UNIT TABS Take 1 tablet by mouth daily with supper.    [provider]  Carboxymethylcellulose Sod PF 0.5 % SOLN Place 1 drop into both eyes daily as needed (dry eyes).    [provider]  cetirizine (ZYRTEC) 10 MG tablet Take 10 mg by mouth daily.    [provider]  cyclobenzaprine (FLEXERIL) 5 MG tablet TAKE 1 TABLET BY MOUTH THREE TIMES DAILY AS NEEDED FOR MUSCLE SPASMS. 01/16/23   Anabel Halon, MD  diclofenac Sodium (VOLTAREN) 1 % GEL Apply 1  application topically daily as needed (pain).    [provider]  estradiol (ESTRACE) 0.1 MG/GM vaginal cream Place 0.5 g vaginally 2 (two) times a week. Place 0.5g nightly for two weeks then twice a week after 07/22/21   Marguerita Beards, MD  ezetimibe (ZETIA) 10 MG tablet Take 1 tablet (10 mg total) by mouth daily. 05/09/22   Anabel Halon, MD  famotidine (PEPCID) 20 MG tablet Take 20 mg by mouth 2 (two) times daily. As needed    [provider]  fluconazole (DIFLUCAN) 150 MG tablet Take 1 now and 1 in 3 days 12/21/22   Cyril Mourning A, NP  ipratropium (ATROVENT) 0.03 % nasal spray Place 2 sprays into both nostrils every 12 (twelve) hours. 11/23/22   Alfonse Spruce, MD  lidocaine-prilocaine (EMLA) cream Apply 1 Application topically at bedtime.    [provider]  lisinopril (ZESTRIL) 20 MG tablet TAKE 1 TABLET BY MOUTH DAILY. 01/02/23   Anabel Halon, MD  methylphenidate (CONCERTA) 36 MG PO CR tablet Take 1 tablet (36 mg total) by mouth daily. 01/17/23   Myrlene Broker, MD  methylphenidate (CONCERTA) 36 MG PO CR tablet Take 1 tablet (36 mg total) by mouth every morning. 01/17/23   Myrlene Broker, MD  methylphenidate 36 MG PO  CR tablet Take 36 mg by mouth daily. 11/16/22   [provider]  montelukast (SINGULAIR) 10 MG tablet TAKE (1) TABLET BY MOUTH AT BEDTIME. 09/06/22   Patel, Earlie Lou, MD  montelukast (SINGULAIR) 10 MG tablet Take 1 tablet (10 mg total) by mouth at bedtime. 11/23/22   Alfonse Spruce, MD  Multiple Vitamin (MULITIVITAMIN WITH MINERALS) TABS Take 1 tablet by mouth daily with breakfast.    [provider]  Nystatin POWD by Does not apply route. As needed.    [provider]  Olopatadine HCl 0.2 % SOLN Place 1 drop into both eyes in the morning and at bedtime.    [provider]  Omega-3 Fatty Acids (FISH OIL) 1000 MG CAPS Take 2,000 mg by mouth.    [provider]  omeprazole (PRILOSEC) 40 MG  capsule TAKE 1 CAPSULE BY MOUTH TWICE DAILY 10/17/22   Anabel Halon, MD  OVER THE COUNTER MEDICATION Milk thistle once per day.  Mushrooms once daily.  Vit K 2 once per day.  Beet root gummies daily    [provider]  oxybutynin (DITROPAN-XL) 10 MG 24 hr tablet Take 1 tablet (10 mg total) by mouth at bedtime. 11/08/22   Anabel Halon, MD  pregabalin (LYRICA) 25 MG capsule Take 1 capsule (25 mg total) by mouth daily. 02/06/23   Anabel Halon, MD  Probiotic Product (TRUBIOTICS PO) Take 1 capsule by mouth daily. Takes 25 cfu daily.    [provider]  pyridOXINE (VITAMIN B-6) 100 MG tablet Take 100 mg by mouth daily.    [provider]  rifaximin (XIFAXAN) 550 MG TABS tablet Take 1 tablet (550 mg total) by mouth 3 (three) times daily. 12/08/22   Raquel James, NP  traZODone (DESYREL) 100 MG tablet TAKE (2) TABLETS BY MOUTH AT BEDTIME. 01/17/23   Myrlene Broker, MD  TYMLOS 3120 MCG/1.56ML SOPN One qhs 07/14/22   [provider]  venlafaxine XR (EFFEXOR XR) 150 MG 24 hr capsule Take 1 capsule (150 mg total) by mouth daily with breakfast. 01/17/23   Myrlene Broker, MD      Allergies    Morphine and codeine and Promethazine hcl    Review of Systems   Review of Systems  Physical Exam Updated Vital Signs BP 107/69 (BP Location: Right Arm)   Pulse 80   Temp 97.8 F (36.6 C) (Temporal)   Resp 18   Ht 5\' 3"  (1.6 m)   Wt 62.1 kg   SpO2 95%   BMI 24.27 kg/m  Physical Exam Vitals and nursing note reviewed.  Constitutional:      General: [name] is not in acute distress.    Appearance: [name] is well-developed.  HENT:     Head: Normocephalic and atraumatic.     Nose: Nose normal.     Mouth/Throat:     Mouth: Mucous membranes are moist.  Eyes:     Conjunctiva/sclera: Conjunctivae normal.  Cardiovascular:     Rate and Rhythm: Normal rate and regular rhythm.     Heart sounds: No murmur heard. Pulmonary:     Effort: Pulmonary effort is normal. No  respiratory distress.     Breath sounds: Normal breath sounds.  Abdominal:     Palpations: Abdomen is soft.     Tenderness: There is no abdominal tenderness.  Musculoskeletal:        General: No swelling.     Cervical back: Neck supple.     Comments:  right wrist has radial pulse intact at 2+.  Can make thumbs up, okay, cross her fingers she can flex and extend her wrist without difficulty.  She has tenderness over the snuffbox and the distal radius but there is no significant swelling.  There is no crepitus or deformity noted.  No tenderness to the hand or the forearm elbow or shoulder.  There is no bruising.  Skin:    General: Skin is warm and dry.     Capillary Refill: Capillary refill takes less than 2 seconds.  Neurological:     General: No focal deficit present.     Mental Status: [name] is alert and oriented to person, place, and time.  Psychiatric:        Mood and Affect: Mood normal.     ED Results / Procedures / Treatments   Labs (all labs ordered are listed, but only abnormal results are displayed) Labs Reviewed - No data to display  EKG None  Radiology DG Wrist Complete Right  Result Date: 02/12/2023 CLINICAL DATA:  Fall with right wrist pain EXAM: RIGHT WRIST - COMPLETE 3+ VIEW COMPARISON:  02/22/2021 right hand radiographs FINDINGS: There is no evidence of fracture or dislocation. There is no evidence of arthropathy or other focal bone abnormality. Soft tissues are unremarkable. IMPRESSION: No right wrist fracture or dislocation. Electronically Signed   By: Delbert Phenix M.D.   On: 02/12/2023 16:01    Procedures Procedures    Medications Ordered in ED Medications  acetaminophen (TYLENOL) tablet 650 mg (650 mg Oral Patient Refused/Not Given 02/12/23 1556)  HYDROcodone-acetaminophen (NORCO/VICODIN) 5-325 MG per tablet 1 tablet (has no administration in time range)    ED Course/ Medical Decision Making/ A&P                                 Medical Decision  Making DDx: Fracture, sprain, strain, contusion, dislocation, other ED course: Patient had a FOOSH injury having some distal radius and snuffbox tenderness on exam but no swelling or other traumatic findings.   Imaging: I ordered x-rays of the right wrist.  Interpreted independently by me within the scope of identifying emergent findings.  I see no fracture or dislocation   Discussed with the patient the snuffbox but tenderness was somewhat concerning with her mechanism will put her in a thumb spica and have her follow-up with orthopedics.  We discussed that it is important for her to wear the splint due to risk of avascular process if there is a occult fracture.  Given Tylenol for pain patient refused asking for narcotic will give 1 dose of Norco in the ER.  Amount and/or Complexity of Data Reviewed Radiology: ordered.  Risk OTC drugs. Prescription drug management.           Final Clinical Impression(s) / ED Diagnoses Final diagnoses:  Right wrist pain    Rx / DC Orders ED Discharge Orders     None         Josem Kaufmann 02/12/23 1614    Eber Hong, MD 02/13/23 [phone]

## 2023-02-13 ENCOUNTER — Other Ambulatory Visit: Payer: Self-pay | Admitting: Physician Assistant

## 2023-02-13 MED ORDER — TRAMADOL HCL 50 MG PO TABS: 50.0000 mg | ORAL_TABLET | Freq: Two times a day (BID) | ORAL | 0 refills | Status: DC | PRN

## 2023-02-13 NOTE — Telephone Encounter (Signed)
Double message

## 2023-02-13 NOTE — Telephone Encounter (Signed)
I sent in tramadol.  May want to have her also come in and see someone

## 2023-02-13 NOTE — Telephone Encounter (Signed)
Cannot say without being evaluated.  I am full this week, but I would see if maryanne or someone else has any openings

## 2023-02-14 ENCOUNTER — Other Ambulatory Visit (INDEPENDENT_AMBULATORY_CARE_PROVIDER_SITE_OTHER): Payer: Self-pay | Admitting: Gastroenterology

## 2023-02-14 DIAGNOSIS — K638219 Small intestinal bacterial overgrowth, unspecified: Secondary | ICD-10-CM

## 2023-02-14 DIAGNOSIS — K582 Mixed irritable bowel syndrome: Secondary | ICD-10-CM

## 2023-02-14 MED ORDER — RIFAXIMIN 550 MG PO TABS
550.0000 mg | ORAL_TABLET | Freq: Three times a day (TID) | ORAL | 0 refills | Status: DC
Start: 2023-02-14 — End: 2023-03-22

## 2023-02-15 ENCOUNTER — Encounter (HOSPITAL_COMMUNITY): Payer: Self-pay

## 2023-02-15 ENCOUNTER — Ambulatory Visit (HOSPITAL_COMMUNITY)
Admission: RE | Admit: 2023-02-15 | Discharge: 2023-02-15 | Disposition: A | Discharge status: Home / Self Care | Payer: PPO | Source: Ambulatory Visit | Attending: Adult Health | Admitting: Adult Health

## 2023-02-15 DIAGNOSIS — Z1231 Encounter for screening mammogram for malignant neoplasm of breast: Secondary | ICD-10-CM | POA: Insufficient documentation

## 2023-02-16 ENCOUNTER — Telehealth (INDEPENDENT_AMBULATORY_CARE_PROVIDER_SITE_OTHER): Payer: Self-pay

## 2023-02-16 NOTE — Telephone Encounter (Signed)
Xifaxan  550 mg is not covered by the patient's insurance.   Patient sent the following message earlier today and I did respond to her regarding the cheese causing issues. Please see below. Thanks,   You could try to stay away from dairy and see if your symptoms get better.  This MyChart message has not been read. [name] "[name]"  to [employer] (supporting Dolores Frame, MD)      02/16/23 11:29 AM I called my insurance company and they said they denied it for some reason, but they are asking if you thought another drug like Loperamide would work. If not they would consider the original one. But I am doing better, it comes and goes. So we will confront this issue. Just don't know what is causing this. Do you know if cheese would effect this?

## 2023-02-17 ENCOUNTER — Encounter (HOSPITAL_COMMUNITY): Payer: Self-pay

## 2023-02-17 ENCOUNTER — Encounter (INDEPENDENT_AMBULATORY_CARE_PROVIDER_SITE_OTHER): Payer: Self-pay

## 2023-02-17 NOTE — Telephone Encounter (Signed)
Spoke with pt she is wanting to hold on on the medication at this time until she is a little stronger because she doesn't know if the medication is helping her or not

## 2023-02-20 ENCOUNTER — Other Ambulatory Visit: Payer: Self-pay | Admitting: Internal Medicine

## 2023-02-20 ENCOUNTER — Encounter: Payer: Self-pay | Admitting: Orthopedic Surgery

## 2023-02-20 DIAGNOSIS — M62838 Other muscle spasm: Secondary | ICD-10-CM

## 2023-02-20 NOTE — Telephone Encounter (Signed)
Okay that's fine

## 2023-02-21 ENCOUNTER — Encounter (INDEPENDENT_AMBULATORY_CARE_PROVIDER_SITE_OTHER): Payer: Self-pay

## 2023-02-23 ENCOUNTER — Other Ambulatory Visit: Payer: Self-pay | Admitting: Orthopedic Surgery

## 2023-02-23 DIAGNOSIS — G8929 Other chronic pain: Secondary | ICD-10-CM

## 2023-02-27 ENCOUNTER — Ambulatory Visit (INDEPENDENT_AMBULATORY_CARE_PROVIDER_SITE_OTHER): Payer: PPO | Admitting: Internal Medicine

## 2023-02-27 ENCOUNTER — Encounter: Payer: Self-pay | Admitting: Internal Medicine

## 2023-02-27 VITALS — BP 127/79 | HR 84 | Ht 63.0 in | Wt 138.4 lb

## 2023-02-27 DIAGNOSIS — K7581 Nonalcoholic steatohepatitis (NASH): Secondary | ICD-10-CM

## 2023-02-27 DIAGNOSIS — S32020S Wedge compression fracture of second lumbar vertebra, sequela: Secondary | ICD-10-CM | POA: Diagnosis not present

## 2023-02-27 DIAGNOSIS — N3941 Urge incontinence: Secondary | ICD-10-CM | POA: Diagnosis not present

## 2023-02-27 DIAGNOSIS — I10 Essential (primary) hypertension: Secondary | ICD-10-CM | POA: Diagnosis not present

## 2023-02-27 DIAGNOSIS — N1831 Chronic kidney disease, stage 3a: Secondary | ICD-10-CM | POA: Diagnosis not present

## 2023-02-27 DIAGNOSIS — R2681 Unsteadiness on feet: Secondary | ICD-10-CM

## 2023-02-27 DIAGNOSIS — R5382 Chronic fatigue, unspecified: Secondary | ICD-10-CM | POA: Diagnosis not present

## 2023-02-27 DIAGNOSIS — Z23 Encounter for immunization: Secondary | ICD-10-CM | POA: Diagnosis not present

## 2023-02-27 DIAGNOSIS — F419 Anxiety disorder, unspecified: Secondary | ICD-10-CM | POA: Diagnosis not present

## 2023-02-27 NOTE — Assessment & Plan Note (Addendum)
Had a mechanical fall at home in 10/23, L2 compression fracture Had a lumbar back brace, followed by orthopedic surgery Pain improved, but has intermittent pain - RUQ area pain likely referred pain, which has improved with Lyrica

## 2023-02-27 NOTE — Assessment & Plan Note (Signed)
Liver enzymes WNL Last [place] RUQ reviewed, showed hepatic steatosis Her BMI is < 25, already follows low carb diet Followed by GI

## 2023-02-27 NOTE — Assessment & Plan Note (Signed)
Likely multifactorial - her chronic low back pain from compression fracture has limited her physical activity MDD, GAD and ADD also contributing to her apathy, followed by psychiatry Currently does not have any B symptoms Up to date with colonoscopy Has CKD, but electrolytes are WNL Checked CBC, CMP, TSH, B12 level

## 2023-02-27 NOTE — Assessment & Plan Note (Signed)
Follows up with Psychiatrist On Alprazolam 1 mg TID, Effexor 150 mg QD and Trazodone 200 mg qHS

## 2023-02-27 NOTE — Patient Instructions (Addendum)
Please continue to take medications as prescribed.  Please continue to follow low carb diet and perform moderate exercise/walking as tolerated.  Please consider Tdap vaccine at local pharmacy.

## 2023-02-27 NOTE — Assessment & Plan Note (Signed)
On Oxybutynin - has chronic dry mouth, but prefers to continue oxybutynin Used to follow-up with urogynecology

## 2023-02-27 NOTE — Assessment & Plan Note (Signed)
BP Readings from Last 1 Encounters:  02/27/23 127/79   Well-controlled with lisinopril 20 mg QD Counseled for compliance with the medications Advised DASH diet and moderate exercise/walking, at least 150 mins/week

## 2023-02-27 NOTE — Assessment & Plan Note (Signed)
Likely from recent injury, DDD of lumbar spine and peripheral neuropathy Continue Lyrica for peripheral neuropathy Maintain adequate hydration and avoid skipping meals

## 2023-02-27 NOTE — Progress Notes (Signed)
Established Patient Office Visit  Subjective:  Patient ID: [name], female    DOB: 06-19-1961  Age: 62 y.o. MRN: [id number]  CC:  Chief Complaint  Patient presents with   Hypertension    Six month follow up    Nutrition Counseling    Patient has questions about her diet   skin pricks    Patient states she can be sitting still and feels like something is pricking her skin    HPI [name] is a 62 y.o. female with past medical history of HTN, CKD, asthma due to seasonal allergies, h/o hemorrhagic stroke in 1999 (unclear etiology), HLD, anxiety with depression, urinary incontinence and GERD who presents for f/u of her chronic medical conditions.  HTN: Her BP has been wnl at home.  She has been taking lisinopril 20 mg daily again.  Her BP was well controlled today.  She denies any headache, chest pain or palpitations.  She had a fall from treadmill recently on 08/11, and had right wrist pain.  Her x-ray of right wrist was negative for any acute fracture.  She was given wrist brace, followed by Dr. Orthopedic surgery.  Lumbar compression fracture: She had a fall at nighttime at her home while using restroom in 10/23.  She sustained a L2 compression fracture, had a brace and was followed by neurosurgeon.  She has low back pain, for which she takes tramadol as needed.  Denies any pain upon deep breathing.  She has been continuing moderate activity as tolerated.    Peripheral neuropathy: She was placed on gabapentin 100 mg nightly, and later increased dose to BID.  She was having dizziness and had to stop taking gabapentin.  She has been taking Lyrica for peripheral neuropathy with better response.  Osteoporosis: She is on Tymlos now.   CKD: Her last CMP showed GFR of 54.  Denies any dysuria, hematuria or urinary hesitance or resistance.  She takes oxybutynin for urinary incontinence.   NASH: Followed by GI.  She has RUQ area pain, which is more likely due to radiating pain  from lumbar spine.  Her pain is intermittent, worse with movement and unrelated to food intake.  She has chronic bloating, and was found to have intestinal methanogen overgrowth.  She was given Xifaxan for SIBO and/or IBS-D by GI, but it was very expensive.  Chronic fatigue: She still reports chronic fatigue.  She currently takes Concerta for ADD, for followed by psychiatry.  Her TSH, vitamin D and B12 levels are WNL.  Denies any active bleeding.  She takes Effexor for MDD and Xanax for GAD.  Past Medical History:  Diagnosis Date   Allergy    grass, dust , mold   Anxiety    Arthritis    Asthma due to seasonal allergies 06/15/2020   Bipolar disorder (HCC)    Carpal tunnel syndrome    Bilateral   Chest pain 09/2011   Cardiac cath-normal coronaries   Constipation    Depression    Difficulty urinating 05/31/2013   Elevated LFTs 12/16/2013   Encounter for general adult medical examination with abnormal findings 07/07/2021   GERD (gastroesophageal reflux disease)    History of kidney stones    Hyperlipemia    Hyperlipidemia    Hypertension    Mild; provoked by stress and anxiety   IBS (irritable bowel syndrome)    Intracerebral bleed (HCC)    No aneurysm; followed by Dr. Athena Masse replaced    "lense  transplant" 2022; pt states lense don't dilate or constrict   Loss of weight 01/06/2015   Osteoporosis    Stage 3a chronic kidney disease (HCC) 03/16/2021   Stroke (HCC) 1999   hemorrhagic stroke; weakness of left side    Past Surgical History:  Procedure Laterality Date   BIOPSY  04/16/2021   Procedure: BIOPSY;  Surgeon: Marguerita Merles, Reuel Boom, MD;  Location: [facility];  Service: Gastroenterology;;  small, bowel, esophageal(proximal and distal);   BRAIN SURGERY  1999   to remove blood clot after stroke    CARDIAC CATHETERIZATION  2016   CERVICAL FUSION     CHOLECYSTECTOMY N/A 10/14/2014   Procedure: LAPAROSCOPIC CHOLECYSTECTOMY WITH INTRAOPERATIVE CHOLANGIOGRAM;   Surgeon: Avel Peace, MD;  Location: [facility] OR;  Service: General;  Laterality: N/A;   CHONDROPLASTY Right 07/13/2017   Procedure: CHONDROPLASTY of patella;  Surgeon: Vickki Hearing, MD;  Location: [facility];  Service: Orthopedics;  Laterality: Right;   ESOPHAGEAL DILATION N/A 04/16/2021   Procedure: ESOPHAGEAL DILATION;  Surgeon: Dolores Frame, MD;  Location: [facility];  Service: Gastroenterology;  Laterality: N/A;   ESOPHAGOGASTRODUODENOSCOPY (EGD) WITH PROPOFOL N/A 04/16/2021   Procedure: ESOPHAGOGASTRODUODENOSCOPY (EGD) WITH PROPOFOL;  Surgeon: Dolores Frame, MD;  Location: [facility];  Service: Gastroenterology;  Laterality: N/A;  1:35, pt knows to arrive at 9:45   EUS N/A 08/21/2015   Procedure: ESOPHAGEAL ENDOSCOPIC ULTRASOUND (EUS) RADIAL;  Surgeon: [name], MD;  Location: [facility];  Service: Endoscopy;  Laterality: N/A;   KNEE ARTHROSCOPY WITH MEDIAL MENISECTOMY Right 07/13/2017   Procedure: KNEE ARTHROSCOPY WITH PARTIAL MEDIAL MENISECTOMY;  Surgeon: Vickki Hearing, MD;  Location: [facility];  Service: Orthopedics;  Laterality: Right;   LEFT HEART CATHETERIZATION WITH CORONARY ANGIOGRAM N/A 09/23/2011   Procedure: LEFT HEART CATHETERIZATION WITH CORONARY ANGIOGRAM;  Surgeon: Vesta Mixer, MD;  Location: [facility];  Service: Cardiovascular;  Laterality: N/A;   LIPOMA EXCISION Left 11/18/2013   Procedure: EXCISION OF SOFT TISSUE MASS-LEFT THIGH;  Surgeon: Dalia Heading, MD;  Location: [facility];  Service: General;  Laterality: Left;   NASAL SEPTOPLASTY W/ TURBINOPLASTY Bilateral 08/30/2021   Procedure: NASAL SEPTOPLASTY WITH BILATERAL TURBINATE REDUCTION;  Surgeon: Newman Pies, MD;  Location: [facility];  Service: ENT;  Laterality: Bilateral;   POLYPECTOMY  04/16/2021   Procedure: POLYPECTOMY;  Surgeon: Dolores Frame, MD;  Location: [facility];  Service: Gastroenterology;;  gastric   RECTOCELE REPAIR     x2    RECTOCELE REPAIR N/A 04/04/2017   Procedure: POSTERIOR REPAIR (RECTOCELE);  Surgeon: Tilda Burrow, MD;  Location: [facility];  Service: Gynecology;  Laterality: N/A;   TOTAL ABDOMINAL HYSTERECTOMY      Family History  Problem Relation Age of Onset   Cancer Mother        breast    Hypertension Mother    Hyperlipidemia Mother    Depression Mother    Anxiety disorder Mother    COPD Mother    Arthritis Mother        rheumatoid   Drug abuse Sister    Coronary artery disease Paternal Grandfather    Coronary artery disease Paternal Uncle    Depression Cousin    Drug abuse Cousin     Social History   Socioeconomic History   Marital status: Married    Spouse name: Dorene Sorrow   Number of children: 0   Years of education: HS   Highest education level: Not on file  Occupational History   Occupation: unemployed    Comment: pending disability  Tobacco Use   Smoking status: Former    Current packs/day: 0.00    Average packs/day: 1 pack/day for 19.0 years (19.0 ttl pk-yrs)    Types: Cigarettes    Start date: 09/02/1978    Quit date: 09/01/1997    Years since quitting: 25.5    Passive exposure: Past   Smokeless tobacco: Never   Tobacco comments:    Quit smoking 1999 , previous 20 pack years  Vaping Use   Vaping status: Never Used  Substance and Sexual Activity   Alcohol use: Yes    Comment: 1 drink every other week   Drug use: No   Sexual activity: Not Currently    Partners: Male    Birth control/protection: Surgical    Comment: hyst   Other Topics Concern   Not on file  Social History Narrative   Currently unable to work   Lives in [place]   Married   Patient drinks 1 cup of caffeine daily.   Patient is right handed.    Joined the [employer] to get more exercise   Social Determinants of Health   Financial Resource Strain: Low Risk  (03/29/2022)   Overall Financial Resource Strain (CARDIA)    Difficulty of Paying Living Expenses: Not hard at all  Food Insecurity: No Food  Insecurity (03/29/2022)   Hunger Vital Sign    Worried About Running Out of Food in the Last Year: Never true    Ran Out of Food in the Last Year: Never true  Transportation Needs: No Transportation Needs (03/29/2022)   PRAPARE - Administrator, Civil Service (Medical): No    Lack of Transportation (Non-Medical): No  Physical Activity: Inactive (03/29/2022)   Exercise Vital Sign    Days of Exercise per Week: 0 days    Minutes of Exercise per Session: 0 min  Stress: Stress Concern Present (03/29/2022)   [employer] of Occupational Health - Occupational Stress Questionnaire    Feeling of Stress : To some extent  Social Connections: Moderately Isolated (03/29/2022)   Social Connection and Isolation Panel [NHANES]    Frequency of Communication with Friends and Family: Twice a week    Frequency of Social Gatherings with Friends and Family: Once a week    Attends Religious Services: Never    Database administrator or Organizations: No    Attends Banker Meetings: Never    Marital Status: Married  Catering manager Violence: Not At Risk (03/29/2022)   Humiliation, Afraid, Rape, and Kick questionnaire    Fear of Current or Ex-Partner: No    Emotionally Abused: No    Physically Abused: No    Sexually Abused: No    Outpatient Medications Prior to Visit  Medication Sig Dispense Refill   traMADol (ULTRAM) 50 MG tablet Take 1 tablet (50 mg total) by mouth every 12 (twelve) hours as needed. (Patient not taking: Reported on 02/27/2023) 30 tablet 0   acetaminophen (TYLENOL 8 HOUR) 650 MG CR tablet Take 650 mg by mouth every 8 (eight) hours.     acetaminophen (TYLENOL) 500 MG tablet Take 500 mg by mouth. As needed     ALPRAZolam (XANAX) 1 MG tablet Take 1 tablet (1 mg total) by mouth 3 (three) times daily. 60 tablet 2   atorvastatin (LIPITOR) 40 MG tablet Take 1 tablet (40 mg total) by mouth daily. 90 tablet 3   Blood Glucose Monitoring Suppl  DEVI 1 each by Does not apply  route in the morning, at noon, and at bedtime. May substitute to any manufacturer covered by patient's insurance. 1 each 0   Calcium 500-125 MG-UNIT TABS Take 1 tablet by mouth daily with supper.     Carboxymethylcellulose Sod PF 0.5 % SOLN Place 1 drop into both eyes daily as needed (dry eyes).     cetirizine (ZYRTEC) 10 MG tablet Take 10 mg by mouth daily.     cyclobenzaprine (FLEXERIL) 5 MG tablet TAKE 1 TABLET BY MOUTH THREE TIMES DAILY AS NEEDED FOR MUSCLE SPASMS. 90 tablet 0   diclofenac Sodium (VOLTAREN) 1 % GEL Apply 1 application topically daily as needed (pain).     estradiol (ESTRACE) 0.1 MG/GM vaginal cream Place 0.5 g vaginally 2 (two) times a week. Place 0.5g nightly for two weeks then twice a week after 30 g 11   ezetimibe (ZETIA) 10 MG tablet Take 1 tablet (10 mg total) by mouth daily. 90 tablet 3   famotidine (PEPCID) 20 MG tablet Take 20 mg by mouth 2 (two) times daily. As needed     fluconazole (DIFLUCAN) 150 MG tablet Take 1 now and 1 in 3 days 2 tablet 1   ipratropium (ATROVENT) 0.03 % nasal spray Place 2 sprays into both nostrils every 12 (twelve) hours. 30 mL 6   lidocaine-prilocaine (EMLA) cream Apply 1 Application topically at bedtime.     lisinopril (ZESTRIL) 20 MG tablet TAKE 1 TABLET BY MOUTH DAILY. 90 tablet 0   methylphenidate (CONCERTA) 36 MG PO CR tablet Take 1 tablet (36 mg total) by mouth daily. 30 tablet 0   montelukast (SINGULAIR) 10 MG tablet Take 1 tablet (10 mg total) by mouth at bedtime. 30 tablet 5   Multiple Vitamin (MULITIVITAMIN WITH MINERALS) TABS Take 1 tablet by mouth daily with breakfast.     Nystatin POWD by Does not apply route. As needed.     Olopatadine HCl 0.2 % SOLN Place 1 drop into both eyes in the morning and at bedtime.     Omega-3 Fatty Acids (FISH OIL) 1000 MG CAPS Take 2,000 mg by mouth.     omeprazole (PRILOSEC) 40 MG capsule TAKE 1 CAPSULE BY MOUTH TWICE DAILY 60 capsule 0   OVER THE COUNTER MEDICATION Milk thistle once per  day.  Mushrooms once daily.  Vit K 2 once per day.  Beet root gummies daily     oxybutynin (DITROPAN-XL) 10 MG 24 hr tablet Take 1 tablet (10 mg total) by mouth at bedtime. 90 tablet 1   pregabalin (LYRICA) 25 MG capsule Take 1 capsule (25 mg total) by mouth daily. 30 capsule 0   Probiotic Product (TRUBIOTICS PO) Take 1 capsule by mouth daily. Takes 25 cfu daily.     pyridOXINE (VITAMIN B-6) 100 MG tablet Take 100 mg by mouth daily.     rifaximin (XIFAXAN) 550 MG TABS tablet Take 1 tablet (550 mg total) by mouth 3 (three) times daily. 42 tablet 0   traZODone (DESYREL) 100 MG tablet TAKE (2) TABLETS BY MOUTH AT BEDTIME. 60 tablet 2   TYMLOS 3120 MCG/1.56ML SOPN One qhs     venlafaxine XR (EFFEXOR XR) 150 MG 24 hr capsule Take 1 capsule (150 mg total) by mouth daily with breakfast. 90 capsule 2   methylphenidate (CONCERTA) 36 MG PO CR tablet Take 1 tablet (36 mg total) by mouth every morning. 30 tablet 0   methylphenidate 36 MG PO CR tablet Take 36 mg  by mouth daily.     montelukast (SINGULAIR) 10 MG tablet TAKE (1) TABLET BY MOUTH AT BEDTIME. 90 tablet 0   No facility-administered medications prior to visit.    Allergies  Allergen Reactions   Morphine And Codeine Hives   Promethazine Hcl Other (See Comments)    Causes patient to become Hyper    ROS Review of Systems  Constitutional:  Positive for fatigue. Negative for chills and fever.  HENT:  Negative for congestion, sinus pressure and sinus pain.   Eyes:  Negative for pain and discharge.  Respiratory:  Negative for cough and shortness of breath.   Cardiovascular:  Negative for chest pain and palpitations.  Gastrointestinal:  Positive for constipation. Negative for diarrhea, nausea and vomiting.  Endocrine: Negative for polydipsia and polyuria.  Genitourinary:  Negative for dysuria and hematuria.  Musculoskeletal:  Positive for arthralgias and back pain. Negative for neck stiffness.  Skin:  Negative for rash.   Allergic/Immunologic: Positive for environmental allergies.  Neurological:  Negative for dizziness and weakness.  Psychiatric/Behavioral:  Positive for dysphoric mood. Negative for agitation and behavioral problems. The patient is nervous/anxious.       Objective:    Physical Exam Vitals reviewed.  Constitutional:      General: [name] is not in acute distress.    Appearance: [name] is not diaphoretic.  HENT:     Head: Normocephalic and atraumatic.     Nose: Nose normal. No congestion.     Mouth/Throat:     Mouth: Mucous membranes are dry.     Pharynx: No posterior oropharyngeal erythema.  Eyes:     General: No scleral icterus.    Extraocular Movements: Extraocular movements intact.  Cardiovascular:     Rate and Rhythm: Normal rate and regular rhythm.     Pulses: Normal pulses.     Heart sounds: Normal heart sounds. No murmur heard. Pulmonary:     Breath sounds: Normal breath sounds. No wheezing or rales.  Musculoskeletal:     Cervical back: Neck supple. No tenderness.     Lumbar back: Tenderness present.  Skin:    General: Skin is warm.     Findings: No rash.  Neurological:     General: No focal deficit present.     Mental Status: [name] is alert and oriented to person, place, and time.     Comments: Facial droop, chronic  Psychiatric:        Mood and Affect: Mood normal.        Behavior: Behavior normal.     BP 127/79 (BP Location: Right Arm, Patient Position: Sitting, Cuff Size: Normal)   Pulse 84   Ht 5\' 3"  (1.6 m)   Wt 138 lb 6.4 oz (62.8 kg)   SpO2 94%   BMI 24.52 kg/m  Wt Readings from Last 3 Encounters:  02/27/23 138 lb 6.4 oz (62.8 kg)  02/12/23 137 lb (62.1 kg)  12/12/22 136 lb (61.7 kg)    Lab Results  Component Value Date   TSH 1.410 02/10/2023   Lab Results  Component Value Date   WBC 8.0 02/10/2023   HGB 13.5 02/10/2023   HCT 39.6 02/10/2023   MCV 98 (H) 02/10/2023   PLT 326 02/10/2023   Lab Results  Component Value Date   NA 141  02/10/2023   K 4.3 02/10/2023   CO2 26 02/10/2023   GLUCOSE 120 (H) 02/10/2023   BUN 19 02/10/2023   CREATININE 1.20 (H) 02/10/2023   BILITOT 0.6 02/10/2023  ALKPHOS 77 02/10/2023   AST 19 02/10/2023   ALT 32 02/10/2023   PROT 7.3 02/10/2023   ALBUMIN 4.8 02/10/2023   CALCIUM 9.7 02/10/2023   ANIONGAP 8 08/25/2021   EGFR 52 (L) 02/10/2023   Lab Results  Component Value Date   CHOL 151 02/10/2023   Lab Results  Component Value Date   HDL 63 02/10/2023   Lab Results  Component Value Date   LDLCALC 58 02/10/2023   Lab Results  Component Value Date   TRIG 184 (H) 02/10/2023   Lab Results  Component Value Date   CHOLHDL 2.4 02/10/2023   Lab Results  Component Value Date   HGBA1C 5.7 (H) 02/10/2023      Assessment & Plan:   Problem List Items Addressed This Visit       Cardiovascular and Mediastinum   Hypertension - Primary    BP Readings from Last 1 Encounters:  02/27/23 127/79   Well-controlled with lisinopril 20 mg QD Counseled for compliance with the medications Advised DASH diet and moderate exercise/walking, at least 150 mins/week        Digestive   NASH (nonalcoholic steatohepatitis)    Liver enzymes WNL Last [place] RUQ reviewed, showed hepatic steatosis Her BMI is < 25, already follows low carb diet Followed by GI        Musculoskeletal and Integument   Lumbar compression fracture (HCC)    Had a mechanical fall at home in 10/23, L2 compression fracture Had a lumbar back brace, followed by orthopedic surgery Pain improved, but has intermittent pain - RUQ area pain likely referred pain, which has improved with Lyrica        Genitourinary   Stage 3a chronic kidney disease (HCC)    Last BMP showed GFR of 52, stable Needs to maintain adequate hydration Avoid nephrotoxic agents including NSAIDs, Dced Nabumetone and Diclofenac On Lisinopril        Other   Anxiety (Chronic)    Follows up with Psychiatrist On Alprazolam 1 mg TID, Effexor  150 mg QD and Trazodone 200 mg qHS      Gait instability    Likely from recent injury, DDD of lumbar spine and peripheral neuropathy Continue Lyrica for peripheral neuropathy Maintain adequate hydration and avoid skipping meals      Urinary incontinence    On Oxybutynin - has chronic dry mouth, but prefers to continue oxybutynin Used to follow-up with urogynecology      Chronic fatigue    Likely multifactorial - her chronic low back pain from compression fracture has limited her physical activity MDD, GAD and ADD also contributing to her apathy, followed by psychiatry Currently does not have any B symptoms Up to date with colonoscopy Has CKD, but electrolytes are WNL Checked CBC, CMP, TSH, B12 level      Other Visit Diagnoses     Encounter for immunization       Relevant Orders   Flu vaccine trivalent PF, 6mos and older(Flulaval,Afluria,Fluarix,Fluzone) (Completed)       No orders of the defined types were placed in this encounter.   Follow-up: Return in about 4 months (around 06/29/2023) for HTN and neuropathy.    Anabel Halon, MD

## 2023-02-27 NOTE — Assessment & Plan Note (Addendum)
Last BMP showed GFR of 52, stable Needs to maintain adequate hydration Avoid nephrotoxic agents including NSAIDs, Dced Nabumetone and Diclofenac On Lisinopril

## 2023-02-28 ENCOUNTER — Encounter: Payer: Self-pay | Admitting: Internal Medicine

## 2023-03-01 ENCOUNTER — Encounter: Payer: Self-pay | Admitting: Orthopedic Surgery

## 2023-03-02 ENCOUNTER — Other Ambulatory Visit (INDEPENDENT_AMBULATORY_CARE_PROVIDER_SITE_OTHER): Payer: PPO

## 2023-03-02 ENCOUNTER — Telehealth: Payer: Self-pay | Admitting: Orthopedic Surgery

## 2023-03-02 ENCOUNTER — Ambulatory Visit: Payer: PPO | Admitting: Orthopedic Surgery

## 2023-03-02 ENCOUNTER — Encounter: Payer: Self-pay | Admitting: Orthopedic Surgery

## 2023-03-02 VITALS — BP 121/74 | HR 84 | Ht 63.0 in | Wt 139.0 lb

## 2023-03-02 DIAGNOSIS — M25531 Pain in right wrist: Secondary | ICD-10-CM

## 2023-03-02 NOTE — Progress Notes (Signed)
Office Visit Note   Patient: [name]           Date of Birth: 12-11-1960           MRN: [id number] Visit Date: 03/02/2023 Requested by: Anabel Halon, MD [address] [place],  [place] [address] PCP: Anabel Halon, MD   Assessment & Plan:   Encounter Diagnoses  Name Primary?  . Pain in right wrist Yes  . Right wrist pain     No orders of the defined types were placed in this encounter.   62 year old female scheduled for MRI right knee tomorrow for ongoing right knee pain  [name] lost her balance injured her right wrist she is worn a brace for 2 weeks initial x-rays were negative however she complains of pain in the snuffbox and the plain films look like there might be a linear nondisplaced scaphoid fracture  Recommend CT scan to evaluate the scaphoid for fracture  Continue wrist brace for now   Subjective: Chief Complaint  Patient presents with  . Wrist Pain    DOI 02/12/23 patient fell backwards working in the flower bed lost balance tried to catch herself on the right wrist.  Pain full depending on what she is doing she states she can not hold a cup of coffee in that hand     HPI: 62 year old female fell on August 11 went to the ER because of wrist pain x-rays show no fracture she was placed in the thumb spica splint.  Still complains of pain              ROS: Right knee pain chronic ongoing scheduled for MRI tomorrow now having some left knee pain   Images personally read and my interpretation : Plain films of the right wrist taken at the ER no fracture or dislocation  Images taken today there appears to be a linear fracture line through the waist of the scaphoid with no displacement of the bone  Visit Diagnoses:  1. Pain in right wrist   2. Right wrist pain      Follow-Up Instructions: No follow-ups on file.    Objective: Vital Signs: BP 121/74   Pulse 84   Ht 5\' 3"  (1.6 m)   Wt 139 lb (63 kg)   BMI 24.62 kg/m   Physical Exam Vitals and  nursing note reviewed.  Constitutional:      Appearance: Normal appearance.  HENT:     Head: Normocephalic and atraumatic.  Eyes:     General: No scleral icterus.       Right eye: No discharge.        Left eye: No discharge.     Extraocular Movements: Extraocular movements intact.     Conjunctiva/sclera: Conjunctivae normal.     Pupils: Pupils are equal, round, and reactive to light.  Cardiovascular:     Rate and Rhythm: Normal rate.     Pulses: Normal pulses.  Skin:    General: Skin is warm and dry.     Capillary Refill: Capillary refill takes less than 2 seconds.  Neurological:     General: No focal deficit present.     Mental Status: [name] is alert and oriented to person, place, and time.  Psychiatric:        Mood and Affect: Mood normal.        Behavior: Behavior normal.        Thought Content: Thought content normal.  Judgment: Judgment normal.     Right Hand Exam   Comments:  Tender snuffbox skin normal range of motion seems normal at the wrist joint there is no instability grip strength is normal but painful     Specialty Comments:  No specialty comments available.  Imaging: No results found.   PMFS History: Patient Active Problem List   Diagnosis Date Noted  . Small intestinal bacterial overgrowth (SIBO) 12/08/2022  . Chronic fatigue 11/09/2022  . Family history of breast cancer 11/08/2022  . Osteoporosis with current pathological fracture 08/29/2022  . Intestinal methanogen overgrowth 04/28/2022  . History of total abdominal hysterectomy 03/28/2022  . Dystrophia unguium 02/01/2022  . Lumbar compression fracture (HCC) 12/23/2021  . Lumbar spondylosis 11/04/2021  . Encounter for general adult medical examination with abnormal findings 07/07/2021  . Gastric polyp 05/31/2021  . NASH (nonalcoholic steatohepatitis) 04/01/2021  . Dysphagia 04/01/2021  . Stage 3a chronic kidney disease (HCC) 03/16/2021  . Arthritis 01/31/2021  . Tietze's disease  06/19/2020  . History of hemorrhagic stroke with residual hemiplegia (HCC) 06/15/2020  . Urinary incontinence 06/15/2020  . Asthma due to seasonal allergies 06/15/2020  . Vaginal itching 03/16/2020  . S/P right knee arthroscopy 07/13/17 07/20/2017  . Derangement of posterior horn of medial meniscus of right knee   . Chondromalacia, patella, right   . Irritable bowel syndrome with constipation and diarrhea 05/11/2015  . Gait instability 01/06/2015  . DDD (degenerative disc disease), lumbar 02/18/2014  . Postmenopausal atrophic vaginitis 11/01/2013  . Lipoma of abdominal wall 08/14/2013  . Back pain 05/29/2013  . Paresthesia of foot 02/12/2013  . Hypertension   . Depression 11/17/2011  . Insomnia 11/17/2011  . Mixed hyperlipidemia 09/20/2011  . Anxiety 09/20/2011   Past Medical History:  Diagnosis Date  . Allergy    grass, dust , mold  . Anxiety   . Arthritis   . Asthma due to seasonal allergies 06/15/2020  . Bipolar disorder (HCC)   . Carpal tunnel syndrome    Bilateral  . Chest pain 09/2011   Cardiac cath-normal coronaries  . Constipation   . Depression   . Difficulty urinating 05/31/2013  . Elevated LFTs 12/16/2013  . Encounter for general adult medical examination with abnormal findings 07/07/2021  . GERD (gastroesophageal reflux disease)   . History of kidney stones   . Hyperlipemia   . Hyperlipidemia   . Hypertension    Mild; provoked by stress and anxiety  . IBS (irritable bowel syndrome)   . Intracerebral bleed (HCC)    No aneurysm; followed by Dr. Newell Coral  . Lens replaced    "lense transplant" 2022; pt states lense don't dilate or constrict  . Loss of weight 01/06/2015  . Osteoporosis   . Stage 3a chronic kidney disease (HCC) 03/16/2021  . Stroke [facility]) 1999   hemorrhagic stroke; weakness of left side    Family History  Problem Relation Age of Onset  . Cancer Mother        breast   . Hypertension Mother   . Hyperlipidemia Mother   . Depression Mother    . Anxiety disorder Mother   . COPD Mother   . Arthritis Mother        rheumatoid  . Drug abuse Sister   . Coronary artery disease Paternal Grandfather   . Coronary artery disease Paternal Uncle   . Depression Cousin   . Drug abuse Cousin     Past Surgical History:  Procedure Laterality Date  . BIOPSY  04/16/2021  Procedure: BIOPSY;  Surgeon: Marguerita Merles, Reuel Boom, MD;  Location: [facility];  Service: Gastroenterology;;  small, bowel, esophageal(proximal and distal);  Marland Kitchen BRAIN SURGERY  1999   to remove blood clot after stroke   . CARDIAC CATHETERIZATION  2016  . CERVICAL FUSION    . CHOLECYSTECTOMY N/A 10/14/2014   Procedure: LAPAROSCOPIC CHOLECYSTECTOMY WITH INTRAOPERATIVE CHOLANGIOGRAM;  Surgeon: Avel Peace, MD;  Location: [facility] OR;  Service: General;  Laterality: N/A;  . CHONDROPLASTY Right 07/13/2017   Procedure: CHONDROPLASTY of patella;  Surgeon: Vickki Hearing, MD;  Location: [facility];  Service: Orthopedics;  Laterality: Right;  . ESOPHAGEAL DILATION N/A 04/16/2021   Procedure: ESOPHAGEAL DILATION;  Surgeon: Dolores Frame, MD;  Location: [facility];  Service: Gastroenterology;  Laterality: N/A;  . ESOPHAGOGASTRODUODENOSCOPY (EGD) WITH PROPOFOL N/A 04/16/2021   Procedure: ESOPHAGOGASTRODUODENOSCOPY (EGD) WITH PROPOFOL;  Surgeon: Dolores Frame, MD;  Location: [facility];  Service: Gastroenterology;  Laterality: N/A;  1:35, pt knows to arrive at 9:45  . EUS N/A 08/21/2015   Procedure: ESOPHAGEAL ENDOSCOPIC ULTRASOUND (EUS) RADIAL;  Surgeon: [name], MD;  Location: [facility];  Service: Endoscopy;  Laterality: N/A;  . KNEE ARTHROSCOPY WITH MEDIAL MENISECTOMY Right 07/13/2017   Procedure: KNEE ARTHROSCOPY WITH PARTIAL MEDIAL MENISECTOMY;  Surgeon: Vickki Hearing, MD;  Location: [facility];  Service: Orthopedics;  Laterality: Right;  . LEFT HEART CATHETERIZATION WITH CORONARY ANGIOGRAM N/A 09/23/2011   Procedure: LEFT HEART  CATHETERIZATION WITH CORONARY ANGIOGRAM;  Surgeon: Vesta Mixer, MD;  Location[facility] CATH LAB;  Service: Cardiovascular;  Laterality: N/A;  . LIPOMA EXCISION Left 11/18/2013   Procedure: EXCISION OF SOFT TISSUE MASS-LEFT THIGH;  Surgeon: Dalia Heading, MD;  Location: [facility];  Service: General;  Laterality: Left;  . NASAL SEPTOPLASTY W/ TURBINOPLASTY Bilateral 08/30/2021   Procedure: NASAL SEPTOPLASTY WITH BILATERAL TURBINATE REDUCTION;  Surgeon: Newman Pies, MD;  Location: [facility];  Service: ENT;  Laterality: Bilateral;  . POLYPECTOMY  04/16/2021   Procedure: POLYPECTOMY;  Surgeon: Dolores Frame, MD;  Location: [facility];  Service: Gastroenterology;;  gastric  . RECTOCELE REPAIR     x2  . RECTOCELE REPAIR N/A 04/04/2017   Procedure: POSTERIOR REPAIR (RECTOCELE);  Surgeon: Tilda Burrow, MD;  Location: [facility];  Service: Gynecology;  Laterality: N/A;  . TOTAL ABDOMINAL HYSTERECTOMY     Social History   Occupational History  . Occupation: unemployed    Comment: pending disability  Tobacco Use  . Smoking status: Former    Current packs/day: 0.00    Average packs/day: 1 pack/day for 19.0 years (19.0 ttl pk-yrs)    Types: Cigarettes    Start date: 09/02/1978    Quit date: 09/01/1997    Years since quitting: 25.5    Passive exposure: Past  . Smokeless tobacco: Never  . Tobacco comments:    Quit smoking 1999 , previous 20 pack years  Vaping Use  . Vaping status: Never Used  Substance and Sexual Activity  . Alcohol use: Yes    Comment: 1 drink every other week  . Drug use: No  . Sexual activity: Not Currently    Partners: Male    Birth control/protection: Surgical    Comment: hyst

## 2023-03-02 NOTE — Telephone Encounter (Signed)
DR. Romeo Apple   Patient called to schedule her CT and [name] can't see her till 04/11/23.    She would like someone to call her back  [phone].

## 2023-03-02 NOTE — Telephone Encounter (Signed)
She may have to drive to [place], I will call her.

## 2023-03-02 NOTE — Patient Instructions (Signed)
Central scheduling [phone] 

## 2023-03-03 NOTE — Telephone Encounter (Signed)
I called her she did ask for sooner in [place] I have sent order to [facility] imaging and provided the phone number for her to call in Mychart

## 2023-03-04 ENCOUNTER — Ambulatory Visit (HOSPITAL_COMMUNITY)
Admission: RE | Admit: 2023-03-04 | Discharge: 2023-03-04 | Disposition: A | Discharge status: Home / Self Care | Payer: PPO | Source: Ambulatory Visit | Attending: Orthopedic Surgery | Admitting: Orthopedic Surgery

## 2023-03-04 DIAGNOSIS — M25561 Pain in right knee: Secondary | ICD-10-CM | POA: Diagnosis not present

## 2023-03-04 DIAGNOSIS — G8929 Other chronic pain: Secondary | ICD-10-CM | POA: Insufficient documentation

## 2023-03-04 DIAGNOSIS — M25461 Effusion, right knee: Secondary | ICD-10-CM | POA: Diagnosis not present

## 2023-03-07 NOTE — Telephone Encounter (Signed)
Ice is best 20 minutes a few times daily but if you do not like the ice okay to use heat.

## 2023-03-10 ENCOUNTER — Encounter: Payer: Self-pay | Admitting: Orthopedic Surgery

## 2023-03-12 ENCOUNTER — Encounter: Payer: Self-pay | Admitting: Orthopedic Surgery

## 2023-03-13 ENCOUNTER — Telehealth: Payer: Self-pay

## 2023-03-13 ENCOUNTER — Other Ambulatory Visit: Payer: Self-pay | Admitting: Internal Medicine

## 2023-03-13 DIAGNOSIS — M47816 Spondylosis without myelopathy or radiculopathy, lumbar region: Secondary | ICD-10-CM

## 2023-03-13 DIAGNOSIS — M810 Age-related osteoporosis without current pathological fracture: Secondary | ICD-10-CM | POA: Diagnosis not present

## 2023-03-13 NOTE — Telephone Encounter (Signed)
I now see she has appointment on 03/23/23 ok for her to keep that one, we likley do not have anything sooner

## 2023-03-13 NOTE — Telephone Encounter (Signed)
Patient called stating that she fell again yesterday and landed on her knee. Stated she couldn't put any pressure on it. She goes tomorrow for CT of wrist. She wanted to make Dr. Romeo Apple aware of her fall and wondered if he had looked at her MRI for her knee. Stated she hopes she hasn't messed anything up.  Please call and advise. [phone]

## 2023-03-14 ENCOUNTER — Encounter: Payer: Self-pay | Admitting: Internal Medicine

## 2023-03-14 ENCOUNTER — Ambulatory Visit (HOSPITAL_COMMUNITY)
Admission: RE | Admit: 2023-03-14 | Discharge: 2023-03-14 | Disposition: A | Discharge status: Home / Self Care | Payer: PPO | Source: Ambulatory Visit | Attending: Orthopedic Surgery | Admitting: Orthopedic Surgery

## 2023-03-14 DIAGNOSIS — M25531 Pain in right wrist: Secondary | ICD-10-CM | POA: Diagnosis not present

## 2023-03-14 DIAGNOSIS — S6991XA Unspecified injury of right wrist, hand and finger(s), initial encounter: Secondary | ICD-10-CM | POA: Diagnosis not present

## 2023-03-16 ENCOUNTER — Other Ambulatory Visit: Payer: Self-pay | Admitting: Internal Medicine

## 2023-03-16 DIAGNOSIS — N3941 Urge incontinence: Secondary | ICD-10-CM

## 2023-03-18 ENCOUNTER — Encounter: Payer: Self-pay | Admitting: Orthopedic Surgery

## 2023-03-20 ENCOUNTER — Telehealth (INDEPENDENT_AMBULATORY_CARE_PROVIDER_SITE_OTHER): Payer: PPO | Admitting: Psychiatry

## 2023-03-20 ENCOUNTER — Encounter (HOSPITAL_COMMUNITY): Payer: Self-pay | Admitting: Psychiatry

## 2023-03-20 ENCOUNTER — Encounter (INDEPENDENT_AMBULATORY_CARE_PROVIDER_SITE_OTHER): Payer: Self-pay | Admitting: Gastroenterology

## 2023-03-20 DIAGNOSIS — F419 Anxiety disorder, unspecified: Secondary | ICD-10-CM | POA: Diagnosis not present

## 2023-03-20 DIAGNOSIS — R413 Other amnesia: Secondary | ICD-10-CM

## 2023-03-20 DIAGNOSIS — F988 Other specified behavioral and emotional disorders with onset usually occurring in childhood and adolescence: Secondary | ICD-10-CM

## 2023-03-20 DIAGNOSIS — F332 Major depressive disorder, recurrent severe without psychotic features: Secondary | ICD-10-CM | POA: Diagnosis not present

## 2023-03-20 MED ORDER — VENLAFAXINE HCL ER 150 MG PO CP24: 150.0000 mg | ORAL_CAPSULE | Freq: Every day | ORAL | 2 refills | Status: DC

## 2023-03-20 MED ORDER — METHYLPHENIDATE HCL ER (OSM) 36 MG PO TBCR: 36.0000 mg | EXTENDED_RELEASE_TABLET | Freq: Every day | ORAL | 0 refills | Status: DC

## 2023-03-20 MED ORDER — ALPRAZOLAM 1 MG PO TABS: 1.0000 mg | ORAL_TABLET | Freq: Three times a day (TID) | ORAL | 2 refills | Status: DC

## 2023-03-20 MED ORDER — TRAZODONE HCL 100 MG PO TABS: ORAL_TABLET | ORAL | 2 refills | Status: DC

## 2023-03-20 NOTE — Progress Notes (Signed)
Virtual Visit via Telephone Note  I connected with [name] on 03/20/23 at  1:00 PM EDT by telephone and verified that I am speaking with the correct person using two identifiers.  Location: Patient: home Provider: office   I discussed the limitations, risks, security and privacy concerns of performing an evaluation and management service by telephone and the availability of in person appointments. I also discussed with the patient that there may be a patient responsible charge related to this service. The patient expressed understanding and agreed to proceed.      I discussed the assessment and treatment plan with the patient. The patient was provided an opportunity to ask questions and all were answered. The patient agreed with the plan and demonstrated an understanding of the instructions.   The patient was advised to call back or seek an in-person evaluation if the symptoms worsen or if the condition fails to improve as anticipated.  I provided 15 minutes of non-face-to-face time during this encounter.   Diamond Ruder, MD  [facility] MD/PA/NP OP Progress Note  03/20/2023 1:19 PM [name]  MRN:  [id number]  Chief Complaint:  Chief Complaint  Patient presents with   Anxiety   Depression   ADD   HPI: T his patient is a 62 year old married white female who lives with her husband in [place]. She has no children. 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   The patient returns for follow-up after 2 months regarding her depression and anxiety as well as problems with fatigue.  She is still having a lot of muscle and joint pain.  She fell and broke her right wrist last month.  She recently fell on her left wrist but has not had it x-rayed yet.  She is not sure what is causing all the falls and she is supposed to see her orthopedics soon as well as a foot doctor.  She states that she is not exactly depressed but just frustrated  with her body "falling apart."  She stopped the Concerta because she was not sure it was helping but would like to retry it to help with her energy and focus.  She is sleeping well with the trazodone and the Xanax helps her anxiety.  She is adamant that it is not causing sedation or falls.  She has gone back on the Effexor XR and she does think that its helped to some degree.  She denies any thoughts of suicide or self-harm Visit Diagnosis:    ICD-10-CM   1. Major depressive disorder, recurrent, severe without psychotic features (HCC)  F33.2     2. ADD (attention deficit disorder) without hyperactivity  F98.8     3. Memory deficit  R41.3       Past Psychiatric History past outpatient treatment for depression  Past Medical History:  Past Medical History:  Diagnosis Date   Allergy    grass, dust , mold   Anxiety    Arthritis    Asthma due to seasonal allergies 06/15/2020   Bipolar disorder (HCC)    Carpal tunnel syndrome    Bilateral   Chest pain 09/2011   Cardiac cath-normal coronaries   Constipation    Depression    Difficulty urinating 05/31/2013   Elevated LFTs 12/16/2013   Encounter for general adult medical examination with abnormal findings 07/07/2021   GERD (gastroesophageal reflux disease)    History of kidney stones  Hyperlipemia    Hyperlipidemia    Hypertension    Mild; provoked by stress and anxiety   IBS (irritable bowel syndrome)    Intracerebral bleed (HCC)    No aneurysm; followed by Dr. Athena Masse replaced    "lense transplant" 2022; pt states lense don't dilate or constrict   Loss of weight 01/06/2015   Osteoporosis    Stage 3a chronic kidney disease (HCC) 03/16/2021   Stroke (HCC) 1999   hemorrhagic stroke; weakness of left side    Past Surgical History:  Procedure Laterality Date   BIOPSY  04/16/2021   Procedure: BIOPSY;  Surgeon: Dolores Frame, MD;  [facility];  Service: Gastroenterology;;  small, bowel,  esophageal(proximal and distal);   BRAIN SURGERY  1999   to remove blood clot after stroke    CARDIAC CATHETERIZATION  2016   CERVICAL FUSION     CHOLECYSTECTOMY N/A 10/14/2014   Procedure: LAPAROSCOPIC CHOLECYSTECTOMY WITH INTRAOPERATIVE CHOLANGIOGRAM;  Surgeon: Avel Peace, MD;  Location: [facility] OR;  Service: General;  Laterality: N/A;   CHONDROPLASTY Right 07/13/2017   Procedure: CHONDROPLASTY of patella;  Surgeon: Vickki Hearing, MD;  Location: [facility];  Service: Orthopedics;  Laterality: Right;   ESOPHAGEAL DILATION N/A 04/16/2021   Procedure: ESOPHAGEAL DILATION;  Surgeon: Dolores Frame, MD;  [facility];  Service: Gastroenterology;  Laterality: N/A;   ESOPHAGOGASTRODUODENOSCOPY (EGD) WITH PROPOFOL N/A 04/16/2021   Procedure: ESOPHAGOGASTRODUODENOSCOPY (EGD) WITH PROPOFOL;  Surgeon: Dolores Frame, MD;  [facility];  Service: Gastroenterology;  Laterality: N/A;  1:35, pt knows to arrive at 9:45   EUS N/A 08/21/2015   Procedure: ESOPHAGEAL ENDOSCOPIC ULTRASOUND (EUS) RADIAL;  Surgeon: [name], MD;  [facility];  Service: Endoscopy;  Laterality: N/A;   KNEE ARTHROSCOPY WITH MEDIAL MENISECTOMY Right 07/13/2017   Procedure: KNEE ARTHROSCOPY WITH PARTIAL MEDIAL MENISECTOMY;  Surgeon: Vickki Hearing, MD;  Location: [facility];  Service: Orthopedics;  Laterality: Right;   LEFT HEART CATHETERIZATION WITH CORONARY ANGIOGRAM N/A 09/23/2011   Procedure: LEFT HEART CATHETERIZATION WITH CORONARY ANGIOGRAM;  Surgeon: Vesta Mixer, MD;  Location: [facility] CATH LAB;  Service: Cardiovascular;  Laterality: N/A;   LIPOMA EXCISION Left 11/18/2013   Procedure: EXCISION OF SOFT TISSUE MASS-LEFT THIGH;  Surgeon: Dalia Heading, MD;  Location: [facility];  Service: General;  Laterality: Left;   NASAL SEPTOPLASTY W/ TURBINOPLASTY Bilateral 08/30/2021   Procedure: NASAL SEPTOPLASTY WITH BILATERAL TURBINATE REDUCTION;  Surgeon: Newman Pies, MD;  Location:  [facility];  Service: ENT;  Laterality: Bilateral;   POLYPECTOMY  04/16/2021   Procedure: POLYPECTOMY;  Surgeon: Dolores Frame, MD;  [facility];  Service: Gastroenterology;;  gastric   RECTOCELE REPAIR     x2   RECTOCELE REPAIR N/A 04/04/2017   Procedure: POSTERIOR REPAIR (RECTOCELE);  Surgeon: Tilda Burrow, MD;  Location: [facility];  Service: Gynecology;  Laterality: N/A;   TOTAL ABDOMINAL HYSTERECTOMY      Family Psychiatric History: See below  Family History:  Family History  Problem Relation Age of Onset   Cancer Mother        breast    Hypertension Mother    Hyperlipidemia Mother    Depression Mother    Anxiety disorder Mother    COPD Mother    Arthritis Mother        rheumatoid   Drug abuse Sister    Coronary artery disease Paternal Grandfather    Coronary artery  disease Paternal Uncle    Depression Cousin    Drug abuse Cousin     Social History:  Social History   Socioeconomic History   Marital status: Married    Spouse name: Dorene Sorrow   Number of children: 0   Years of education: HS   Highest education level: Not on file  Occupational History   Occupation: unemployed    Comment: pending disability  Tobacco Use   Smoking status: Former    Current packs/day: 0.00    Average packs/day: 1 pack/day for 19.0 years (19.0 ttl pk-yrs)    Types: Cigarettes    Start date: 09/02/1978    Quit date: 09/01/1997    Years since quitting: 25.5    Passive exposure: Past   Smokeless tobacco: Never   Tobacco comments:    Quit smoking 1999 , previous 20 pack years  Vaping Use   Vaping status: Never Used  Substance and Sexual Activity   Alcohol use: Yes    Comment: 1 drink every other week   Drug use: No   Sexual activity: Not Currently    Partners: Male    Birth control/protection: Surgical    Comment: hyst   Other Topics Concern   Not on file  Social History Narrative   Currently unable to work   Lives in [place]   Married    Patient drinks 1 cup of caffeine daily.   Patient is right handed.    Joined the [employer] to get more exercise   Social Determinants of Health   Financial Resource Strain: Low Risk  (03/29/2022)   Overall Financial Resource Strain (CARDIA)    Difficulty of Paying Living Expenses: Not hard at all  Food Insecurity: No Food Insecurity (03/29/2022)   Hunger Vital Sign    Worried About Running Out of Food in the Last Year: Never true    Ran Out of Food in the Last Year: Never true  Transportation Needs: No Transportation Needs (03/29/2022)   PRAPARE - Administrator, Civil Service (Medical): No    Lack of Transportation (Non-Medical): No  Physical Activity: Inactive (03/29/2022)   Exercise Vital Sign    Days of Exercise per Week: 0 days    Minutes of Exercise per Session: 0 min  Stress: Stress Concern Present (03/29/2022)   [employer] of Occupational Health - Occupational Stress Questionnaire    Feeling of Stress : To some extent  Social Connections: Moderately Isolated (03/29/2022)   Social Connection and Isolation Panel [NHANES]    Frequency of Communication with Friends and Family: Twice a week    Frequency of Social Gatherings with Friends and Family: Once a week    Attends Religious Services: Never    Database administrator or Organizations: No    Attends Banker Meetings: Never    Marital Status: Married    Allergies:  Allergies  Allergen Reactions   Morphine And Codeine Hives   Promethazine Hcl Other (See Comments)    Causes patient to become Hyper    Metabolic Disorder Labs: Lab Results  Component Value Date   HGBA1C 5.7 (H) 02/10/2023   MPG 117 (H) 01/06/2015   MPG 117 (H) 02/17/2014   No results found for: "PROLACTIN" Lab Results  Component Value Date   CHOL 151 02/10/2023   TRIG 184 (H) 02/10/2023   HDL 63 02/10/2023   CHOLHDL 2.4 02/10/2023   VLDL 26 09/16/2015   LDLCALC 58 02/10/2023   LDLCALC 52 08/25/2022  Lab Results   Component Value Date   TSH 1.410 02/10/2023   TSH 1.720 08/25/2022    Therapeutic Level Labs: No results found for: "LITHIUM" No results found for: "VALPROATE" No results found for: "CBMZ"  Current Medications: Current Outpatient Medications  Medication Sig Dispense Refill   methylphenidate (CONCERTA) 36 MG PO CR tablet Take 1 tablet (36 mg total) by mouth daily. 30 tablet 0   acetaminophen (TYLENOL 8 HOUR) 650 MG CR tablet Take 650 mg by mouth every 8 (eight) hours.     acetaminophen (TYLENOL) 500 MG tablet Take 500 mg by mouth. As needed     ALPRAZolam (XANAX) 1 MG tablet Take 1 tablet (1 mg total) by mouth 3 (three) times daily. 60 tablet 2   atorvastatin (LIPITOR) 40 MG tablet Take 1 tablet (40 mg total) by mouth daily. 90 tablet 3   Blood Glucose Monitoring Suppl DEVI 1 each by Does not apply route in the morning, at noon, and at bedtime. May substitute to any manufacturer covered by patient's insurance. 1 each 0   Calcium 500-125 MG-UNIT TABS Take 1 tablet by mouth daily with supper.     Carboxymethylcellulose Sod PF 0.5 % SOLN Place 1 drop into both eyes daily as needed (dry eyes).     cetirizine (ZYRTEC) 10 MG tablet Take 10 mg by mouth daily.     cyclobenzaprine (FLEXERIL) 5 MG tablet TAKE 1 TABLET BY MOUTH THREE TIMES DAILY AS NEEDED FOR MUSCLE SPASMS. 90 tablet 0   diclofenac Sodium (VOLTAREN) 1 % GEL Apply 1 application topically daily as needed (pain).     estradiol (ESTRACE) 0.1 MG/GM vaginal cream Place 0.5 g vaginally 2 (two) times a week. Place 0.5g nightly for two weeks then twice a week after 30 g 11   ezetimibe (ZETIA) 10 MG tablet Take 1 tablet (10 mg total) by mouth daily. 90 tablet 3   famotidine (PEPCID) 20 MG tablet Take 20 mg by mouth 2 (two) times daily. As needed     fluconazole (DIFLUCAN) 150 MG tablet Take 1 now and 1 in 3 days 2 tablet 1   ipratropium (ATROVENT) 0.03 % nasal spray Place 2 sprays into both nostrils every 12 (twelve) hours. 30 mL 6    lidocaine-prilocaine (EMLA) cream Apply 1 Application topically at bedtime.     lisinopril (ZESTRIL) 20 MG tablet TAKE 1 TABLET BY MOUTH DAILY. 90 tablet 0   methylphenidate (CONCERTA) 36 MG PO CR tablet Take 1 tablet (36 mg total) by mouth daily. 30 tablet 0   montelukast (SINGULAIR) 10 MG tablet Take 1 tablet (10 mg total) by mouth at bedtime. 30 tablet 5   Multiple Vitamin (MULITIVITAMIN WITH MINERALS) TABS Take 1 tablet by mouth daily with breakfast.     Nystatin POWD by Does not apply route. As needed.     Olopatadine HCl 0.2 % SOLN Place 1 drop into both eyes in the morning and at bedtime.     Omega-3 Fatty Acids (FISH OIL) 1000 MG CAPS Take 2,000 mg by mouth.     omeprazole (PRILOSEC) 40 MG capsule TAKE 1 CAPSULE BY MOUTH TWICE DAILY 60 capsule 0   OVER THE COUNTER MEDICATION Milk thistle once per day.  Mushrooms once daily.  Vit K 2 once per day.  Beet root gummies daily     oxybutynin (DITROPAN-XL) 10 MG 24 hr tablet Take 1 tablet (10 mg total) by mouth at bedtime. 90 tablet 1   pregabalin (LYRICA) 25 MG capsule TAKE  ONE CAPSULE BY MOUTH EVERY DAY 30 capsule 0   Probiotic Product (TRUBIOTICS PO) Take 1 capsule by mouth daily. Takes 25 cfu daily.     pyridOXINE (VITAMIN B-6) 100 MG tablet Take 100 mg by mouth daily.     rifaximin (XIFAXAN) 550 MG TABS tablet Take 1 tablet (550 mg total) by mouth 3 (three) times daily. 42 tablet 0   traMADol (ULTRAM) 50 MG tablet Take 1 tablet (50 mg total) by mouth every 12 (twelve) hours as needed. 30 tablet 0   traZODone (DESYREL) 100 MG tablet TAKE (2) TABLETS BY MOUTH AT BEDTIME. 60 tablet 2   TYMLOS 3120 MCG/1.56ML SOPN One qhs     venlafaxine XR (EFFEXOR XR) 150 MG 24 hr capsule Take 1 capsule (150 mg total) by mouth daily with breakfast. 90 capsule 2   No current facility-administered medications for this visit.     Musculoskeletal: Strength & Muscle Tone: na Gait & Station: na Patient leans: N/A  Psychiatric Specialty Exam: Review  of Systems  Musculoskeletal:  Positive for arthralgias, gait problem and myalgias.  Neurological:  Positive for weakness.  Psychiatric/Behavioral:  Positive for decreased concentration.   All other systems reviewed and are negative.   There were no vitals taken for this visit.There is no height or weight on file to calculate BMI.  General Appearance: NA  Eye Contact:  NA  Speech:  Clear and Coherent  Volume:  Normal  Mood:  Dysphoric  Affect:  NA  Thought Process:  Goal Directed  Orientation:  Full (Time, Place, and Person)  Thought Content: Rumination   Suicidal Thoughts:  No  Homicidal Thoughts:  No  Memory:  Immediate;   Good Recent;   Fair Remote;   Fair  Judgement:  Good  Insight:  Fair  Psychomotor Activity:  Decreased  Concentration:  Concentration: Fair and Attention Span: Fair  Recall:  Good  Fund of Knowledge: Good  Language: Good  Akathisia:  No  Handed:  Right  AIMS (if indicated): not done  Assets:  Communication Skills Desire for Improvement Resilience Social Support  ADL's:  Intact  Cognition: WNL  Sleep:  Good   Screenings: AUDIT    Flowsheet Row Clinical Support from 01/06/2021 in [facility]  Alcohol Use Disorder Identification Test Final Score (AUDIT) 4      GAD-7    Flowsheet Row Office Visit from 08/29/2022 in [facility]  Total GAD-7 Score 6      MDI    Flowsheet Row Office Visit from 01/22/2016 in [facility]  Total Score (max 50) 34      Mini-Mental    Flowsheet Row Office Visit from 02/13/2015 in [facility]  Total Score (max 30 points ) 26      PHQ2-9    Flowsheet Row Office Visit from 02/27/2023 in [facility] Office Visit from [facility]21/2022 in [facility] Office Visit from 08/29/2022 in [facility] Office Visit from  05/09/2022 in [facility] Clinical Support from 03/29/2022 in [facility]  PHQ-2 Total Score 0 0 2 1 1   PHQ-9 Total Score -- -- 5 -- --      SBQ-R    Flowsheet Row Office Visit from 01/22/2016 in [facility] Total Score 14.1      Flowsheet  Row ED from 02/12/2023 in [facility] at [facility] ED from 04/15/2022 in [facility] Emergency Department at [facility] Video Visit from 02/15/2022 in [facility] at [place]  C-SSRS RISK CATEGORY No Risk No Risk No Risk        Assessment and Plan: This patient is a 63 year old female with a history depression anxiety ADD chronic pain and chronic fatigue.  She would like to retry the Concerta 36 mg every morning for fatigue and focus.  She will continue Effexor XR 150 mg for depression and anxiety.  She will continue trazodone 200 mg at bedtime for sleep and Xanax 1 mg up to 3 times daily for anxiety.  She was warned not to combine the Xanax with any muscle relaxers.  She will return to see me in 2 months  Collaboration of Care: Collaboration of Care: Primary Care Provider AEB notes will be shared with PCP on the epic system  Patient/Guardian was advised Release of Information must be obtained prior to any record release in order to collaborate their care with an outside provider. Patient/Guardian was advised if they have not already done so to contact the registration department to sign all necessary forms in order for [place] to release information regarding their care.   Consent: Patient/Guardian gives verbal consent for treatment and assignment of benefits for services provided during this visit. Patient/Guardian expressed understanding and agreed to proceed.    Diamond Ruder, MD 03/20/2023, 1:19 PM

## 2023-03-21 ENCOUNTER — Other Ambulatory Visit: Payer: Self-pay | Admitting: Internal Medicine

## 2023-03-21 ENCOUNTER — Telehealth (HOSPITAL_COMMUNITY): Payer: Self-pay

## 2023-03-21 ENCOUNTER — Encounter: Payer: Self-pay | Admitting: Internal Medicine

## 2023-03-21 DIAGNOSIS — K219 Gastro-esophageal reflux disease without esophagitis: Secondary | ICD-10-CM

## 2023-03-21 MED ORDER — OMEPRAZOLE 40 MG PO CPDR: 40.0000 mg | DELAYED_RELEASE_CAPSULE | Freq: Two times a day (BID) | ORAL | 3 refills | Status: DC

## 2023-03-21 NOTE — Telephone Encounter (Addendum)
Pt called in stating that she can not find the concerta medication anywhere locally she states that she will hold off for this month and see what happens next month to see if the pharmacies will have the medication back in. Pt wanted Dr Tenny Craw to know this information

## 2023-03-21 NOTE — Telephone Encounter (Signed)
Okay; thanks.

## 2023-03-21 NOTE — Telephone Encounter (Signed)
So, the patient will need a written script for Xifaxan 550 mg one tablet TID for 14 days. # 42. She will then have to either mail the script to them, fax it to them at [phone] or email it to them. They are in [place] and they do not have an electronic means of receiving the script.

## 2023-03-22 ENCOUNTER — Other Ambulatory Visit (INDEPENDENT_AMBULATORY_CARE_PROVIDER_SITE_OTHER): Payer: Self-pay | Admitting: Gastroenterology

## 2023-03-22 ENCOUNTER — Encounter (INDEPENDENT_AMBULATORY_CARE_PROVIDER_SITE_OTHER): Payer: Self-pay

## 2023-03-22 DIAGNOSIS — K638219 Small intestinal bacterial overgrowth, unspecified: Secondary | ICD-10-CM

## 2023-03-22 DIAGNOSIS — K2289 Other specified disease of esophagus: Secondary | ICD-10-CM | POA: Diagnosis not present

## 2023-03-22 DIAGNOSIS — R1319 Other dysphagia: Secondary | ICD-10-CM | POA: Diagnosis not present

## 2023-03-22 DIAGNOSIS — R1310 Dysphagia, unspecified: Secondary | ICD-10-CM | POA: Diagnosis not present

## 2023-03-22 DIAGNOSIS — K582 Mixed irritable bowel syndrome: Secondary | ICD-10-CM

## 2023-03-22 MED ORDER — RIFAXIMIN 550 MG PO TABS
550.0000 mg | ORAL_TABLET | Freq: Three times a day (TID) | ORAL | 0 refills | Status: DC
Start: 2023-03-22 — End: 2023-07-31

## 2023-03-23 ENCOUNTER — Ambulatory Visit: Payer: PPO | Admitting: Orthopedic Surgery

## 2023-03-23 ENCOUNTER — Other Ambulatory Visit (INDEPENDENT_AMBULATORY_CARE_PROVIDER_SITE_OTHER): Payer: Self-pay

## 2023-03-23 ENCOUNTER — Telehealth: Payer: Self-pay | Admitting: Internal Medicine

## 2023-03-23 DIAGNOSIS — M1711 Unilateral primary osteoarthritis, right knee: Secondary | ICD-10-CM

## 2023-03-23 DIAGNOSIS — M2241 Chondromalacia patellae, right knee: Secondary | ICD-10-CM

## 2023-03-23 DIAGNOSIS — M25531 Pain in right wrist: Secondary | ICD-10-CM

## 2023-03-23 DIAGNOSIS — M25561 Pain in right knee: Secondary | ICD-10-CM | POA: Diagnosis not present

## 2023-03-23 DIAGNOSIS — M25532 Pain in left wrist: Secondary | ICD-10-CM

## 2023-03-23 DIAGNOSIS — G8929 Other chronic pain: Secondary | ICD-10-CM

## 2023-03-23 DIAGNOSIS — Z9889 Other specified postprocedural states: Secondary | ICD-10-CM | POA: Diagnosis not present

## 2023-03-23 MED ORDER — TRAMADOL-ACETAMINOPHEN 37.5-325 MG PO TABS: 1.0000 | ORAL_TABLET | ORAL | 5 refills | Status: DC | PRN

## 2023-03-23 NOTE — Telephone Encounter (Signed)
Patient left urine sample in lab

## 2023-03-23 NOTE — Telephone Encounter (Signed)
Patient said unable to leave urine today will bring back tomorrow morning.

## 2023-03-23 NOTE — Telephone Encounter (Signed)
error 

## 2023-03-23 NOTE — Progress Notes (Signed)
Follow-up visit  Encounter Diagnoses  Name Primary?   Pain in left wrist Yes   Right wrist pain    Chronic pain of right knee    S/P right knee arthroscopy 07/13/17    Chondromalacia, patella, right    Primary osteoarthritis of right knee    Chief Complaint  Patient presents with   Results    CT scan wrist right, MRI right knee   2 studies to review which I have reviewed  The CT scan shows a fracture of the distal radius subacute  And also confirms no scaphoid fracture  The MRI shows arthritis of the joint with changes consistent with previous meniscectomy no definitive acute medial or lateral meniscus tear  Patient complains of left wrist pain after falling since I saw her so we will x-ray the left wrist  She has tenderness and swelling over the left wrist pain over the distal radius and provide tenderness around the joint  X-ray left wrist neg.   Plan  Right wrist brace as needed   Left wrist support   Right knee econ hinge brace   Follow-up 3 mos   She can only take Tylenol she is taking 650 every 6 she cannot take NSAIDs  Some starting her on Ultracet  Meds ordered this encounter  Medications   traMADol-acetaminophen (ULTRACET) 37.5-325 MG tablet    Sig: Take 1 tablet by mouth every 4 (four) hours as needed.    Dispense:  90 tablet    Refill:  5

## 2023-03-24 ENCOUNTER — Encounter: Payer: Self-pay | Admitting: Orthopedic Surgery

## 2023-03-24 DIAGNOSIS — N3941 Urge incontinence: Secondary | ICD-10-CM | POA: Diagnosis not present

## 2023-03-25 LAB — UA/M W/RFLX CULTURE, ROUTINE
Bilirubin, UA: NEGATIVE
Glucose, UA: NEGATIVE
Ketones, UA: NEGATIVE
Leukocytes,UA: NEGATIVE
Nitrite, UA: NEGATIVE
Protein,UA: NEGATIVE
RBC, UA: NEGATIVE
Specific Gravity, UA: 1.018 (ref 1.005–1.030)
Urobilinogen, Ur: 0.2 mg/dL (ref 0.2–1.0)
pH, UA: 7 (ref 5.0–7.5)

## 2023-03-25 LAB — MICROSCOPIC EXAMINATION
Bacteria, UA: NONE SEEN
Casts: NONE SEEN /lpf
Epithelial Cells (non renal): NONE SEEN /hpf (ref 0–10)
RBC, Urine: NONE SEEN /hpf (ref 0–2)
WBC, UA: NONE SEEN /hpf (ref 0–5)

## 2023-03-26 ENCOUNTER — Encounter: Payer: Self-pay | Admitting: Internal Medicine

## 2023-03-26 ENCOUNTER — Encounter (INDEPENDENT_AMBULATORY_CARE_PROVIDER_SITE_OTHER): Payer: Self-pay | Admitting: Gastroenterology

## 2023-03-26 ENCOUNTER — Encounter: Payer: Self-pay | Admitting: Orthopedic Surgery

## 2023-03-27 ENCOUNTER — Other Ambulatory Visit (INDEPENDENT_AMBULATORY_CARE_PROVIDER_SITE_OTHER): Payer: Self-pay

## 2023-03-27 ENCOUNTER — Ambulatory Visit: Payer: PPO | Admitting: Orthopedic Surgery

## 2023-03-27 ENCOUNTER — Other Ambulatory Visit: Payer: Self-pay | Admitting: Internal Medicine

## 2023-03-27 ENCOUNTER — Telehealth (INDEPENDENT_AMBULATORY_CARE_PROVIDER_SITE_OTHER): Payer: Self-pay | Admitting: Gastroenterology

## 2023-03-27 ENCOUNTER — Encounter: Payer: Self-pay | Admitting: Orthopedic Surgery

## 2023-03-27 ENCOUNTER — Telehealth: Payer: Self-pay | Admitting: Radiology

## 2023-03-27 DIAGNOSIS — M79672 Pain in left foot: Secondary | ICD-10-CM | POA: Diagnosis not present

## 2023-03-27 DIAGNOSIS — M6702 Short Achilles tendon (acquired), left ankle: Secondary | ICD-10-CM | POA: Diagnosis not present

## 2023-03-27 DIAGNOSIS — M79671 Pain in right foot: Secondary | ICD-10-CM | POA: Diagnosis not present

## 2023-03-27 DIAGNOSIS — N3941 Urge incontinence: Secondary | ICD-10-CM

## 2023-03-27 DIAGNOSIS — R1319 Other dysphagia: Secondary | ICD-10-CM

## 2023-03-27 DIAGNOSIS — M6701 Short Achilles tendon (acquired), right ankle: Secondary | ICD-10-CM

## 2023-03-27 MED ORDER — MIRABEGRON ER 25 MG PO TB24: 25.0000 mg | ORAL_TABLET | Freq: Every day | ORAL | 3 refills | Status: DC

## 2023-03-27 NOTE — Progress Notes (Signed)
Office Visit Note   Patient: [name]           Date of Birth: 1960/11/09           MRN: [id number] Visit Date: 03/27/2023              Requested by: Anabel Halon, MD [address] [place],  [place] [address] PCP: Anabel Halon, MD  Chief Complaint  Patient presents with   Right Foot - Pain   Left Foot - Pain      HPI: Patient is a 62 year old woman who presents with bilateral foot pain.  She has been wearing orthotics for 3 years.  Patient complains of pain across the ball of her foot and clawing of her toes.  She states she has difficulty with balance and drags her left leg secondary to a stroke.  Assessment & Plan: Visit Diagnoses:  1. Bilateral foot pain   2. Contracture of both Achilles tendons     Plan: Recommended sole orthotics with Achilles stretching.  Patient would also benefit from a stiff soled shoe to unload the forefoot.  Do not feel she needs an AFO at this time.  She may benefit from a gastrocnemius recession.  Follow-up if she is unable to achieve dorsiflexion of the ankle with stretching as instructed.  Follow-Up Instructions: Return if symptoms worsen or fail to improve.   Ortho Exam  Patient is alert, oriented, no adenopathy, well-dressed, normal affect, normal respiratory effort. Examination patient has good pulses she has old orthotics.  With ambulation she slides her left foot.  The right ankle has dorsiflexion to neutral with good toe and ankle extension strength.  She has clawing of the toes in the right foot with a cavovarus foot.  The left ankle has dorsiflexion 20 degrees short of neutral and she does have good dorsiflexion strength of the ankle against resistance there is clawing of the toes on the left foot as well.  She has prominent metatarsal heads bilaterally secondary to the cavovarus foot and is tender to palpation beneath the metatarsal heads.  Imaging: XR Foot Complete Left  Result Date: 03/27/2023 Three-view radiograph of  the left foot shows no fractures with well-maintained joint spaces.  XR Foot Complete Right  Result Date: 03/27/2023 Three-view radiographs of the right foot shows no fractures the joint spaces are well-maintained.  No images are attached to the encounter.  Labs: Lab Results  Component Value Date   HGBA1C 5.7 (H) 02/10/2023   HGBA1C 5.8 (H) 08/25/2022   HGBA1C 5.5 11/01/2021   ESRSEDRATE 2 02/13/2015   LABORGA Multiple bacterial morphotypes present, none 06/24/2015   LABORGA predominant. Suggest appropriate recollection if 06/24/2015   LABORGA clinically indicated. 06/24/2015     Lab Results  Component Value Date   ALBUMIN 4.8 02/10/2023   ALBUMIN 4.6 08/25/2022   ALBUMIN 4.7 11/01/2021    No results found for: "MG" Lab Results  Component Value Date   VD25OH 86.6 02/10/2023   VD25OH 77.5 08/25/2022   VD25OH 53.4 11/01/2021    No results found for: "PREALBUMIN"    Latest Ref Rng & Units 02/10/2023    8:52 AM 08/25/2022    8:14 AM 11/01/2021    8:32 AM  CBC EXTENDED  WBC 3.4 - 10.8 x10E3/uL 8.0  11.5  6.1   RBC 3.77 - 5.28 x10E6/uL 4.04  3.77  3.77   Hemoglobin 11.1 - 15.9 g/dL 96.2  95.2  84.1   HCT 34.0 - 46.6 %  39.6  36.7  36.5   Platelets 150 - 450 x10E3/uL 326  492  349   NEUT# 1.4 - 7.0 x10E3/uL  8.4  3.4   Lymph# 0.7 - 3.1 x10E3/uL  2.1  1.9      There is no height or weight on file to calculate BMI.  Orders:  Orders Placed This Encounter  Procedures   XR Foot Complete Right   XR Foot Complete Left   No orders of the defined types were placed in this encounter.    Procedures: No procedures performed  Clinical Data: No additional findings.  ROS:  All other systems negative, except as noted in the HPI. Review of Systems  Objective: Vital Signs: There were no vitals taken for this visit.  Specialty Comments:  No specialty comments available.  PMFS History: Patient Active Problem List   Diagnosis Date Noted   Small intestinal bacterial  overgrowth (SIBO) 12/08/2022   Chronic fatigue 11/09/2022   Family history of breast cancer 11/08/2022   Osteoporosis with current pathological fracture 08/29/2022   Intestinal methanogen overgrowth 04/28/2022   History of total abdominal hysterectomy 03/28/2022   Dystrophia unguium 02/01/2022   Lumbar compression fracture (HCC) 12/23/2021   Lumbar spondylosis 11/04/2021   Encounter for general adult medical examination with abnormal findings 07/07/2021   Gastric polyp 05/31/2021   NASH (nonalcoholic steatohepatitis) 04/01/2021   Dysphagia 04/01/2021   Stage 3a chronic kidney disease (HCC) 03/16/2021   Arthritis 01/31/2021   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   History of hemorrhagic stroke with residual hemiplegia (HCC) 06/15/2020   Urinary incontinence 06/15/2020   Asthma due to seasonal allergies 06/15/2020   Vaginal itching 03/16/2020   S/P right knee arthroscopy 07/13/17 07/20/2017   Derangement of posterior horn of medial meniscus of right knee    Chondromalacia, patella, right    Irritable bowel syndrome with constipation and diarrhea 05/11/2015   Gait instability 01/06/2015   DDD (degenerative disc disease), lumbar 02/18/2014   Postmenopausal atrophic vaginitis 11/01/2013   Lipoma of abdominal wall 08/14/2013   Back pain 05/29/2013   Paresthesia of foot 02/12/2013   Hypertension    Depression 11/17/2011   Insomnia 11/17/2011   Mixed hyperlipidemia 09/20/2011   Anxiety 09/20/2011   Past Medical History:  Diagnosis Date   Allergy    grass, dust , mold   Anxiety    Arthritis    Asthma due to seasonal allergies 06/15/2020   Bipolar disorder (HCC)    Carpal tunnel syndrome    Bilateral   Chest pain 09/2011   Cardiac cath-normal coronaries   Constipation    Depression    Difficulty urinating 05/31/2013   Elevated LFTs 12/16/2013   Encounter for general adult medical examination with abnormal findings 07/07/2021   GERD (gastroesophageal reflux disease)    History of  kidney stones    Hyperlipemia    Hyperlipidemia    Hypertension    Mild; provoked by stress and anxiety   IBS (irritable bowel syndrome)    Intracerebral bleed (HCC)    No aneurysm; followed by Dr. Athena Masse replaced    "lense transplant" 2022; pt states lense don't dilate or constrict   Loss of weight 01/06/2015   Osteoporosis    Stage 3a chronic kidney disease (HCC) 03/16/2021   Stroke (HCC) 1999   hemorrhagic stroke; weakness of left side    Family History  Problem Relation Age of Onset   Cancer Mother        breast  Hypertension Mother    Hyperlipidemia Mother    Depression Mother    Anxiety disorder Mother    COPD Mother    Arthritis Mother        rheumatoid   Drug abuse Sister    Coronary artery disease Paternal Grandfather    Coronary artery disease Paternal Uncle    Depression Cousin    Drug abuse Cousin     Past Surgical History:  Procedure Laterality Date   BIOPSY  04/16/2021   Procedure: BIOPSY;  Surgeon: Marguerita Merles, Reuel Boom, MD;  Location: [facility];  Service: Gastroenterology;;  small, bowel, esophageal(proximal and distal);   BRAIN SURGERY  1999   to remove blood clot after stroke    CARDIAC CATHETERIZATION  2016   CERVICAL FUSION     CHOLECYSTECTOMY N/A 10/14/2014   Procedure: LAPAROSCOPIC CHOLECYSTECTOMY WITH INTRAOPERATIVE CHOLANGIOGRAM;  Surgeon: Avel Peace, MD;  Location: [facility];  Service: General;  Laterality: N/A;   CHONDROPLASTY Right 07/13/2017   Procedure: CHONDROPLASTY of patella;  Surgeon: Vickki Hearing, MD;  Location: [facility];  Service: Orthopedics;  Laterality: Right;   ESOPHAGEAL DILATION N/A 04/16/2021   Procedure: ESOPHAGEAL DILATION;  Surgeon: Dolores Frame, MD;  Location: [facility];  Service: Gastroenterology;  Laterality: N/A;   ESOPHAGOGASTRODUODENOSCOPY (EGD) WITH PROPOFOL N/A 04/16/2021   Procedure: ESOPHAGOGASTRODUODENOSCOPY (EGD) WITH PROPOFOL;  Surgeon: Dolores Frame, MD;   Location: [facility];  Service: Gastroenterology;  Laterality: N/A;  1:35, pt knows to arrive at 9:45   EUS N/A 08/21/2015   Procedure: ESOPHAGEAL ENDOSCOPIC ULTRASOUND (EUS) RADIAL;  Surgeon: [name], MD;  Location: [facility];  Service: Endoscopy;  Laterality: N/A;   KNEE ARTHROSCOPY WITH MEDIAL MENISECTOMY Right 07/13/2017   Procedure: KNEE ARTHROSCOPY WITH PARTIAL MEDIAL MENISECTOMY;  Surgeon: Vickki Hearing, MD;  Location: [facility];  Service: Orthopedics;  Laterality: Right;   LEFT HEART CATHETERIZATION WITH CORONARY ANGIOGRAM N/A 09/23/2011   Procedure: LEFT HEART CATHETERIZATION WITH CORONARY ANGIOGRAM;  Surgeon: Vesta Mixer, MD;  Location: [facility] C[facility];  Service: Cardiovascular;  Laterality: N/A;   LIPOMA EXCISION Left 11/18/2013   Procedure: EXCISION OF SOFT TISSUE MASS-LEFT THIGH;  Surgeon: Dalia Heading, MD;  Location: [facility];  Service: General;  Laterality: Left;   NASAL SEPTOPLASTY W/ TURBINOPLASTY Bilateral 08/30/2021   Procedure: NASAL SEPTOPLASTY WITH BILATERAL TURBINATE REDUCTION;  Surgeon: Newman Pies, MD;  Location: [facility];  Service: ENT;  Laterality: Bilateral;   POLYPECTOMY  04/16/2021   Procedure: POLYPECTOMY;  Surgeon: Dolores Frame, MD;  [facility];  Service: Gastroenterology;;  gastric   RECTOCELE REPAIR     x2   RECTOCELE REPAIR N/A 04/04/2017   Procedure: POSTERIOR REPAIR (RECTOCELE);  Surgeon: Tilda Burrow, MD;  Location: [facility];  Service: Gynecology;  Laterality: N/A;   TOTAL ABDOMINAL HYSTERECTOMY     Social History   Occupational History   Occupation: unemployed    Comment: pending disability  Tobacco Use   Smoking status: Former    Current packs/day: 0.00    Average packs/day: 1 pack/day for 19.0 years (19.0 ttl pk-yrs)    Types: Cigarettes    Start date: 09/02/1978    Quit date: 09/01/1997    Years since quitting: 25.5    Passive exposure: Past   Smokeless tobacco: Never   Tobacco comments:     Quit smoking 1999 , previous 20 pack years  Vaping Use   Vaping status: Never Used  Substance and Sexual  Activity   Alcohol use: Yes    Comment: 1 drink every other week   Drug use: No   Sexual activity: Not Currently    Partners: Male    Birth control/protection: Surgical    Comment: hyst

## 2023-03-27 NOTE — Telephone Encounter (Signed)
I received the results of the most recent esophageal manometry performed at [facility] on 03/22/2023.  This was read by Dr. Clydene Laming.  This test was performed of opiates.  Patient had an elevated IRP of 35.0.  It was hard to determine if there was peristalsis or simultaneous swallows.  There was abnormal bolus clearance for liquids.  Based on the assessment by Dr. Ebony Cargo, it was hard to determine if this pattern corresponds to an [facility] or type III achalasia with presence of a spastic pattern.  I discussed the findings with the patient.  Will need to refer her to Dr. Marissa Nestle office for further evaluation with Endoflip.  We will also evaluate her symptoms with a time barium swallow.  Hi Ann, can you please refer the patient to [facility] clinic (Dr. Ebony Cargo for dysphagia and concern for achalasia?  Hi Tanya, can you please schedule a esophagram with achalasia protocol? Dx: Dysphagia and abnormal esophageal manometry.   Thanks,  Katrinka Blazing, MD Gastroenterology and Hepatology [facility]astroenterology

## 2023-03-27 NOTE — Telephone Encounter (Signed)
Completed a prior auth request for Ultracet on cover my meds

## 2023-03-28 ENCOUNTER — Encounter: Payer: Self-pay | Admitting: Orthopedic Surgery

## 2023-03-28 ENCOUNTER — Encounter (INDEPENDENT_AMBULATORY_CARE_PROVIDER_SITE_OTHER): Payer: Self-pay | Admitting: Gastroenterology

## 2023-03-28 NOTE — Telephone Encounter (Signed)
Referral sent, they will contact patient with apt

## 2023-03-28 NOTE — Telephone Encounter (Signed)
Thanks

## 2023-03-29 ENCOUNTER — Encounter (INDEPENDENT_AMBULATORY_CARE_PROVIDER_SITE_OTHER): Payer: Self-pay | Admitting: *Deleted

## 2023-03-30 ENCOUNTER — Encounter: Payer: Self-pay | Admitting: Orthopedic Surgery

## 2023-04-03 ENCOUNTER — Encounter (INDEPENDENT_AMBULATORY_CARE_PROVIDER_SITE_OTHER): Payer: Self-pay

## 2023-04-03 ENCOUNTER — Other Ambulatory Visit: Payer: Self-pay | Admitting: Internal Medicine

## 2023-04-03 DIAGNOSIS — M62838 Other muscle spasm: Secondary | ICD-10-CM

## 2023-04-03 DIAGNOSIS — M47816 Spondylosis without myelopathy or radiculopathy, lumbar region: Secondary | ICD-10-CM

## 2023-04-03 NOTE — Telephone Encounter (Signed)
Esophogram scheduled for 04/07/23 at 9:30 AM; arrive at 9:15am [facility]. My chart message sent to patient.

## 2023-04-06 ENCOUNTER — Encounter (INDEPENDENT_AMBULATORY_CARE_PROVIDER_SITE_OTHER): Payer: Self-pay | Admitting: Gastroenterology

## 2023-04-06 ENCOUNTER — Encounter: Payer: Self-pay | Admitting: Orthopedic Surgery

## 2023-04-07 ENCOUNTER — Ambulatory Visit (HOSPITAL_COMMUNITY)
Admission: RE | Admit: 2023-04-07 | Discharge: 2023-04-07 | Disposition: A | Discharge status: Home / Self Care | Payer: PPO | Source: Ambulatory Visit | Attending: Gastroenterology

## 2023-04-07 DIAGNOSIS — R1319 Other dysphagia: Secondary | ICD-10-CM | POA: Insufficient documentation

## 2023-04-07 DIAGNOSIS — R1310 Dysphagia, unspecified: Secondary | ICD-10-CM | POA: Diagnosis not present

## 2023-04-09 ENCOUNTER — Encounter: Payer: Self-pay | Admitting: Internal Medicine

## 2023-04-10 ENCOUNTER — Encounter (INDEPENDENT_AMBULATORY_CARE_PROVIDER_SITE_OTHER): Payer: Self-pay

## 2023-04-10 ENCOUNTER — Other Ambulatory Visit: Payer: Self-pay | Admitting: Internal Medicine

## 2023-04-10 DIAGNOSIS — I10 Essential (primary) hypertension: Secondary | ICD-10-CM

## 2023-04-10 MED ORDER — LISINOPRIL 20 MG PO TABS
20.0000 mg | ORAL_TABLET | Freq: Every day | ORAL | 1 refills | Status: DC
Start: 2023-04-10 — End: 2023-09-25

## 2023-04-11 ENCOUNTER — Other Ambulatory Visit (HOSPITAL_COMMUNITY): Payer: PPO

## 2023-04-12 ENCOUNTER — Encounter: Payer: Self-pay | Admitting: Orthopedic Surgery

## 2023-04-13 ENCOUNTER — Encounter: Payer: Self-pay | Admitting: Internal Medicine

## 2023-04-14 ENCOUNTER — Encounter: Payer: Self-pay | Admitting: Orthopedic Surgery

## 2023-04-14 ENCOUNTER — Ambulatory Visit: Payer: PPO | Admitting: Orthopedic Surgery

## 2023-04-14 ENCOUNTER — Other Ambulatory Visit (INDEPENDENT_AMBULATORY_CARE_PROVIDER_SITE_OTHER): Payer: PPO

## 2023-04-14 DIAGNOSIS — M25552 Pain in left hip: Secondary | ICD-10-CM

## 2023-04-14 NOTE — Progress Notes (Signed)
Follow up   Chief Complaint  Patient presents with   Hip Pain    Multiple falls, has left groin pain    Wrist Pain    Right     Encounter Diagnosis  Name Primary?   Pain in left hip Yes    Bonita Quin has several issues she fell she injured her left hip she complained of some groin pain so I brought her in for x-ray most of her pain is actually in her back  Exam of the left hip shows full range of motion without pain  She is tender in the left side of her lower back she has difficulty bending and straightening from the waist  Her right knee still bothers her she has patellofemoral arthritis she has difficulty going up and down the stairs.  Her knee x-rays do not warrant total knee replacement and MRI does not either  Recommend she use heat on her back take her Flexeril and Ultracet for pain  She has a follow-up appointment in December  It is okay for her to use a stationary bike but no treadmill.

## 2023-04-14 NOTE — Patient Instructions (Addendum)
Ok to use heat on your back when you are resting Ok to be active, but stop if something hurts you use the exercise bike instead of the treadmill Use the brace when you are active  If your back continues to hurt see Dr Ophelia Charter for follow up   Take the Ultracet (Tramdol with APAP) and the Cyclobenzoprene

## 2023-04-17 ENCOUNTER — Encounter: Payer: Self-pay | Admitting: Orthopedic Surgery

## 2023-04-17 ENCOUNTER — Other Ambulatory Visit: Payer: Self-pay | Admitting: Internal Medicine

## 2023-04-17 DIAGNOSIS — N3941 Urge incontinence: Secondary | ICD-10-CM

## 2023-04-17 MED ORDER — MIRABEGRON ER 50 MG PO TB24
50.0000 mg | ORAL_TABLET | Freq: Every day | ORAL | 5 refills | Status: DC
Start: 2023-04-17 — End: 2023-10-09

## 2023-04-18 ENCOUNTER — Ambulatory Visit (INDEPENDENT_AMBULATORY_CARE_PROVIDER_SITE_OTHER): Payer: PPO | Admitting: Gastroenterology

## 2023-04-18 ENCOUNTER — Encounter (INDEPENDENT_AMBULATORY_CARE_PROVIDER_SITE_OTHER): Payer: Self-pay

## 2023-04-19 ENCOUNTER — Encounter (HOSPITAL_COMMUNITY): Payer: Self-pay

## 2023-04-19 ENCOUNTER — Ambulatory Visit (INDEPENDENT_AMBULATORY_CARE_PROVIDER_SITE_OTHER): Payer: PPO

## 2023-04-19 VITALS — Ht 63.0 in | Wt 138.0 lb

## 2023-04-19 DIAGNOSIS — Z0000 Encounter for general adult medical examination without abnormal findings: Secondary | ICD-10-CM | POA: Diagnosis not present

## 2023-04-19 NOTE — Patient Instructions (Signed)
[name] , Thank you for taking time to come for your Medicare Wellness Visit. I appreciate your ongoing commitment to your health goals. Please review the following plan we discussed and let me know if I can assist you in the future.   Referrals/Orders/Follow-Ups/Clinician Recommendations:  Next Medicare Annual Wellness Visit: April 22, 2024 at 9:20 am virtual visit  This is a list of the screening recommended for you and due dates:  Health Maintenance  Topic Date Due   COVID-19 Vaccine (6 - 2023-24 season) 05/19/2023   Mammogram  02/15/2024   Medicare Annual Wellness Visit  04/18/2024   Colon Cancer Screening  08/22/2028   DTaP/Tdap/Td vaccine (4 - Td or Tdap) 03/23/2033   Flu Shot  Completed   Hepatitis C Screening  Completed   HIV Screening  Completed   Zoster (Shingles) Vaccine  Completed   HPV Vaccine  Aged Out    Advanced directives: (ACP Link)Information on Advanced Care Planning can be found at [facility] [place] Advance Health Care Directives Advance Health Care Directives ([email or web address]   Next Medicare Annual Wellness Visit scheduled for next year: Yes  Preventive Care 45-74 Years Old, Female Preventive care refers to lifestyle choices and visits with your health care provider that can promote health and wellness. Preventive care visits are also called wellness exams. What can I expect for my preventive care visit? Counseling Your health care provider may ask you questions about your: Medical history, including: Past medical problems. Family medical history. Pregnancy history. Current health, including: Menstrual cycle. Method of birth control. Emotional well-being. Home life and relationship well-being. Sexual activity and sexual health. Lifestyle, including: Alcohol, nicotine or tobacco, and drug use. Access to firearms. Diet, exercise, and sleep habits. Work and work Astronomer. Sunscreen use. Safety issues such as seatbelt and bike helmet  use. Physical exam Your health care provider will check your: Height and weight. These may be used to calculate your BMI (body mass index). BMI is a measurement that tells if you are at a healthy weight. Waist circumference. This measures the distance around your waistline. This measurement also tells if you are at a healthy weight and may help predict your risk of certain diseases, such as type 2 diabetes and high blood pressure. Heart rate and blood pressure. Body temperature. Skin for abnormal spots. What immunizations do I need?  Vaccines are usually given at various ages, according to a schedule. Your health care provider will recommend vaccines for you based on your age, medical history, and lifestyle or other factors, such as travel or where you work. What tests do I need? Screening Your health care provider may recommend screening tests for certain conditions. This may include: Lipid and cholesterol levels. Diabetes screening. This is done by checking your blood sugar (glucose) after you have not eaten for a while (fasting). Pelvic exam and Pap test. Hepatitis B test. Hepatitis C test. HIV (human immunodeficiency virus) test. STI (sexually transmitted infection) testing, if you are at risk. Lung cancer screening. Colorectal cancer screening. Mammogram. Talk with your health care provider about when you should start having regular mammograms. This may depend on whether you have a family history of breast cancer. BRCA-related cancer screening. This may be done if you have a family history of breast, ovarian, tubal, or peritoneal cancers. Bone density scan. This is done to screen for osteoporosis. Talk with your health care provider about your test results, treatment options, and if necessary, the need for more tests. Follow these instructions at  home: Eating and drinking  Eat a diet that includes fresh fruits and vegetables, whole grains, lean protein, and low-fat dairy  products. Take vitamin and mineral supplements as recommended by your health care provider. Do not drink alcohol if: Your health care provider tells you not to drink. You are pregnant, may be pregnant, or are planning to become pregnant. If you drink alcohol: Limit how much you have to 0-1 drink a day. Know how much alcohol is in your drink. In the [place] one drink equals one 12 oz bottle of beer (355 mL), one 5 oz glass of wine (148 mL), or one 1 oz glass of hard liquor (44 mL). Lifestyle Brush your teeth every morning and night with fluoride toothpaste. Floss one time each day. Exercise for at least 30 minutes 5 or more days each week. Do not use any products that contain nicotine or tobacco. These products include cigarettes, chewing tobacco, and vaping devices, such as e-cigarettes. If you need help quitting, ask your health care provider. Do not use drugs. If you are sexually active, practice safe sex. Use a condom or other form of protection to prevent STIs. If you do not wish to become pregnant, use a form of birth control. If you plan to become pregnant, see your health care provider for a prepregnancy visit. Take aspirin only as told by your health care provider. Make sure that you understand how much to take and what form to take. Work with your health care provider to find out whether it is safe and beneficial for you to take aspirin daily. Find healthy ways to manage stress, such as: Meditation, yoga, or listening to music. Journaling. Talking to a trusted person. Spending time with friends and family. Minimize exposure to UV radiation to reduce your risk of skin cancer. Safety Always wear your seat belt while driving or riding in a vehicle. Do not drive: If you have been drinking alcohol. Do not ride with someone who has been drinking. When you are tired or distracted. While texting. If you have been using any mind-altering substances or drugs. Wear a helmet and other  protective equipment during sports activities. If you have firearms in your house, make sure you follow all gun safety procedures. Seek help if you have been physically or sexually abused. What's next? Visit your health care provider once a year for an annual wellness visit. Ask your health care provider how often you should have your eyes and teeth checked. Stay up to date on all vaccines. This information is not intended to replace advice given to you by your health care provider. Make sure you discuss any questions you have with your health care provider. Document Revised: 12/16/2020 Document Reviewed: 12/16/2020 Elsevier Patient Education  2024 [employer].

## 2023-04-19 NOTE — Progress Notes (Signed)
 Because this visit was a virtual/telehealth visit,  certain criteria was not obtained, such a blood pressure, CBG if applicable, and timed get up and go. Any medications not marked as "taking" were not mentioned during the medication reconciliation part of the visit. Any vitals not documented were not able to be obtained due to this being a telehealth visit or patient was unable to self-report a recent blood pressure reading due to a lack of equipment at home via telehealth. Vitals that have been documented are verbally provided by the patient.   Subjective:   [name] is a 62 y.o. female who presents for Medicare Annual (Subsequent) preventive examination.  Visit Complete: Virtual I connected with  Helyne L Arterberry on 04/19/23 by a audio enabled telemedicine application and verified that I am speaking with the correct person using two identifiers.  Patient Location: Home  Provider Location: Home Office  I discussed the limitations of evaluation and management by telemedicine. The patient expressed understanding and agreed to proceed.  Vital Signs: Because this visit was a virtual/telehealth visit, some criteria may be missing or patient reported. Any vitals not documented were not able to be obtained and vitals that have been documented are patient reported.  Patient Medicare AWV questionnaire was completed by the patient on na; I have confirmed that all information answered by patient is correct and no changes since this date.  Cardiac Risk Factors include: advanced age (>31men, >28 women);dyslipidemia;hypertension     Objective:    Today's Vitals   04/19/23 0830  Weight: 138 lb (62.6 kg)  Height: 5\' 3"  (1.6 m)   Body mass index is 24.45 kg/m.     04/19/2023    8:30 AM 02/12/2023    3:12 PM 04/15/2022   10:36 AM 03/29/2022    4:09 PM 02/10/2022   10:34 AM 02/07/2022   10:00 AM 11/11/2021    4:15 PM  Advanced Directives  Does Patient Have a Medical Advance Directive? No  No No No No No No  Would patient like information on creating a medical advance directive? No - Patient declined   No - Patient declined No - Patient declined No - Patient declined No - Patient declined    Current Medications (verified) Outpatient Encounter Medications as of 04/19/2023  Medication Sig   acetaminophen (TYLENOL 8 HOUR) 650 MG CR tablet Take 650 mg by mouth every 8 (eight) hours.   acetaminophen (TYLENOL) 500 MG tablet Take 500 mg by mouth. As needed   ALPRAZolam (XANAX) 1 MG tablet Take 1 tablet (1 mg total) by mouth 3 (three) times daily.   atorvastatin (LIPITOR) 40 MG tablet Take 1 tablet (40 mg total) by mouth daily.   Blood Glucose Monitoring Suppl DEVI 1 each by Does not apply route in the morning, at noon, and at bedtime. May substitute to any manufacturer covered by patient's insurance.   Calcium 500-125 MG-UNIT TABS Take 1 tablet by mouth daily with supper.   Carboxymethylcellulose Sod PF 0.5 % SOLN Place 1 drop into both eyes daily as needed (dry eyes).   cetirizine (ZYRTEC) 10 MG tablet Take 10 mg by mouth daily.   cyclobenzaprine (FLEXERIL) 5 MG tablet TAKE 1 TABLET BY MOUTH THREE TIMES DAILY AS NEEDED FOR MUSCLE SPASMS.   diclofenac Sodium (VOLTAREN) 1 % GEL Apply 1 application topically daily as needed (pain).   estradiol (ESTRACE) 0.1 MG/GM vaginal cream Place 0.5 g vaginally 2 (two) times a week. Place 0.5g nightly for two weeks then twice a  week after   ezetimibe (ZETIA) 10 MG tablet Take 1 tablet (10 mg total) by mouth daily.   famotidine (PEPCID) 20 MG tablet Take 20 mg by mouth 2 (two) times daily. As needed   ipratropium (ATROVENT) 0.03 % nasal spray Place 2 sprays into both nostrils every 12 (twelve) hours.   lidocaine-prilocaine (EMLA) cream Apply 1 Application topically at bedtime.   lisinopril (ZESTRIL) 20 MG tablet Take 1 tablet (20 mg total) by mouth daily.   methylphenidate (CONCERTA) 36 MG PO CR tablet Take 1 tablet (36 mg total) by mouth daily.    methylphenidate (CONCERTA) 36 MG PO CR tablet Take 1 tablet (36 mg total) by mouth daily.   mirabegron ER (MYRBETRIQ) 50 MG TB24 tablet Take 1 tablet (50 mg total) by mouth daily.   montelukast (SINGULAIR) 10 MG tablet Take 1 tablet (10 mg total) by mouth at bedtime.   Multiple Vitamin (MULITIVITAMIN WITH MINERALS) TABS Take 1 tablet by mouth daily with breakfast.   Nystatin POWD by Does not apply route. As needed.   Olopatadine HCl 0.2 % SOLN Place 1 drop into both eyes in the morning and at bedtime.   Omega-3 Fatty Acids (FISH OIL) 1000 MG CAPS Take 2,000 mg by mouth.   omeprazole (PRILOSEC) 40 MG capsule Take 1 capsule (40 mg total) by mouth 2 (two) times daily.   OVER THE COUNTER MEDICATION Milk thistle once per day.  Mushrooms once daily.  Vit K 2 once per day.  Beet root gummies daily   pregabalin (LYRICA) 25 MG capsule TAKE ONE CAPSULE BY MOUTH EVERY DAY   Probiotic Product (TRUBIOTICS PO) Take 1 capsule by mouth daily. Takes 25 cfu daily.   pyridOXINE (VITAMIN B-6) 100 MG tablet Take 100 mg by mouth daily.   rifaximin (XIFAXAN) 550 MG TABS tablet Take 1 tablet (550 mg total) by mouth 3 (three) times daily.   traMADol (ULTRAM) 50 MG tablet Take 1 tablet (50 mg total) by mouth every 12 (twelve) hours as needed.   traMADol-acetaminophen (ULTRACET) 37.5-325 MG tablet Take 1 tablet by mouth every 4 (four) hours as needed.   traZODone (DESYREL) 100 MG tablet TAKE (2) TABLETS BY MOUTH AT BEDTIME.   TYMLOS 3120 MCG/1.56ML SOPN One qhs   venlafaxine XR (EFFEXOR XR) 150 MG 24 hr capsule Take 1 capsule (150 mg total) by mouth daily with breakfast.   No facility-administered encounter medications on file as of 04/19/2023.    Allergies (verified) Morphine and codeine and Promethazine hcl   History: Past Medical History:  Diagnosis Date   Allergy    grass, dust , mold   Anxiety    Arthritis    Asthma due to seasonal allergies 06/15/2020   Bipolar disorder (HCC)    Carpal tunnel  syndrome    Bilateral   Chest pain 09/2011   Cardiac cath-normal coronaries   Constipation    Depression    Difficulty urinating 05/31/2013   Elevated LFTs 12/16/2013   Encounter for general adult medical examination with abnormal findings 07/07/2021   GERD (gastroesophageal reflux disease)    History of kidney stones    Hyperlipemia    Hyperlipidemia    Hypertension    Mild; provoked by stress and anxiety   IBS (irritable bowel syndrome)    Intracerebral bleed (HCC)    No aneurysm; followed by Dr. Athena Masse replaced    "lense transplant" 2022; pt states lense don't dilate or constrict   Loss of weight 01/06/2015  Osteoporosis    Stage 3a chronic kidney disease (HCC) 03/16/2021   Stroke (HCC) 1999   hemorrhagic stroke; weakness of left side   Past Surgical History:  Procedure Laterality Date   BIOPSY  04/16/2021   Procedure: BIOPSY;  Surgeon: Marguerita Merles, Reuel Boom, MD;  Location: [facility];  Service: Gastroenterology;;  small, bowel, esophageal(proximal and distal);   BRAIN SURGERY  1999   to remove blood clot after stroke    CARDIAC CATHETERIZATION  2016   CERVICAL FUSION     CHOLECYSTECTOMY N/A 10/14/2014   Procedure: LAPAROSCOPIC CHOLECYSTECTOMY WITH INTRAOPERATIVE CHOLANGIOGRAM;  Surgeon: Avel Peace, MD;  [facility];  Service: General;  Laterality: N/A;   CHONDROPLASTY Right 07/13/2017   Procedure: CHONDROPLASTY of patella;  Surgeon: Vickki Hearing, MD;  Location: [facility];  Service: Orthopedics;  Laterality: Right;   ESOPHAGEAL DILATION N/A 04/16/2021   Procedure: ESOPHAGEAL DILATION;  Surgeon: Dolores Frame, MD;  Location: [facility];  Service: Gastroenterology;  Laterality: N/A;   ESOPHAGOGASTRODUODENOSCOPY (EGD) WITH PROPOFOL N/A 04/16/2021   Procedure: ESOPHAGOGASTRODUODENOSCOPY (EGD) WITH PROPOFOL;  Surgeon: Dolores Frame, MD;  Location: [facility];  Service: Gastroenterology;  Laterality: N/A;  1:35, pt  knows to arrive at 9:45   EUS N/A 08/21/2015   Procedure: ESOPHAGEAL ENDOSCOPIC ULTRASOUND (EUS) RADIAL;  Surgeon: [name], MD;  Location: [facility];  Service: Endoscopy;  Laterality: N/A;   KNEE ARTHROSCOPY WITH MEDIAL MENISECTOMY Right 07/13/2017   Procedure: KNEE ARTHROSCOPY WITH PARTIAL MEDIAL MENISECTOMY;  Surgeon: Vickki Hearing, MD;  Location: [facility];  Service: Orthopedics;  Laterality: Right;   LEFT HEART CATHETERIZATION WITH CORONARY ANGIOGRAM N/A 09/23/2011   Procedure: LEFT HEART CATHETERIZATION WITH CORONARY ANGIOGRAM;  Surgeon: Vesta Mixer, MD;  Location: [facility] CATH LAB;  Service: Cardiovascular;  Laterality: N/A;   LIPOMA EXCISION Left 11/18/2013   Procedure: EXCISION OF SOFT TISSUE MASS-LEFT THIGH;  Surgeon: Dalia Heading, MD;  Location: [facility];  Service: General;  Laterality: Left;   NASAL SEPTOPLASTY W/ TURBINOPLASTY Bilateral 08/30/2021   Procedure: NASAL SEPTOPLASTY WITH BILATERAL TURBINATE REDUCTION;  Surgeon: Newman Pies, MD;  Location: [facility];  Service: ENT;  Laterality: Bilateral;   POLYPECTOMY  04/16/2021   Procedure: POLYPECTOMY;  Surgeon: Dolores Frame, MD;  [facility];  Service: Gastroenterology;;  gastric   RECTOCELE REPAIR     x2   RECTOCELE REPAIR N/A 04/04/2017   Procedure: POSTERIOR REPAIR (RECTOCELE);  Surgeon: Tilda Burrow, MD;  Location: [facility];  Service: Gynecology;  Laterality: N/A;   TOTAL ABDOMINAL HYSTERECTOMY     Family History  Problem Relation Age of Onset   Cancer Mother        breast    Hypertension Mother    Hyperlipidemia Mother    Depression Mother    Anxiety disorder Mother    COPD Mother    Arthritis Mother        rheumatoid   Drug abuse Sister    Coronary artery disease Paternal Grandfather    Coronary artery disease Paternal Uncle    Depression Cousin    Drug abuse Cousin    Social History   Socioeconomic History   Marital status: Married    Spouse name: Dorene Sorrow    Number of children: 0   Years of education: HS   Highest education level: Not on file  Occupational History   Occupation: unemployed    Comment: pending disability  Tobacco Use   Smoking status: Former  Current packs/day: 0.00    Average packs/day: 1 pack/day for 19.0 years (19.0 ttl pk-yrs)    Types: Cigarettes    Start date: 09/02/1978    Quit date: 09/01/1997    Years since quitting: 25.6    Passive exposure: Past   Smokeless tobacco: Never   Tobacco comments:    Quit smoking 1999 , previous 20 pack years  Vaping Use   Vaping status: Never Used  Substance and Sexual Activity   Alcohol use: Yes    Comment: 1 drink every other week   Drug use: No   Sexual activity: Not Currently    Partners: Male    Birth control/protection: Surgical    Comment: hyst   Other Topics Concern   Not on file  Social History Narrative   Currently unable to work   Lives in [place]   Married   Patient drinks 1 cup of caffeine daily.   Patient is right handed.    [facility]ined [employer] to get more exercise   Social Determinants of Health   Financial Resource Strain: Low Risk  (04/19/2023)   Overall Financial Resource Strain (CARDIA)    Difficulty of Paying Living Expenses: Not hard at all  Food Insecurity: No Food Insecurity (04/19/2023)   Hunger Vital Sign    Worried About Running Out of Food in the Last Year: Never true    Ran Out of Food in the Last Year: Never true  Transportation Needs: No Transportation Needs (04/19/2023)   PRAPARE - Administrator, Civil Service (Medical): No    Lack of Transportation (Non-Medical): No  Physical Activity: Sufficiently Active (04/19/2023)   Exercise Vital Sign    Days of Exercise per Week: 7 days    Minutes of Exercise per Session: 30 min  Stress: No Stress Concern Present (04/19/2023)   [employer] of Occupational Health - Occupational Stress Questionnaire    Feeling of Stress : Only a little  Social Connections: Moderately Isolated  (04/19/2023)   Social Connection and Isolation Panel [NHANES]    Frequency of Communication with Friends and Family: Twice a week    Frequency of Social Gatherings with Friends and Family: Once a week    Attends Religious Services: Never    Database administrator or Organizations: No    Attends Engineer, structural: Never    Marital Status: Married    Tobacco Counseling Counseling given: Yes Tobacco comments: Quit smoking 1999 , previous 20 pack years   Clinical Intake:  Pre-visit preparation completed: Yes  Pain : No/denies pain     BMI - recorded: 24.45 Nutritional Status: BMI of 19-24  Normal Nutritional Risks: Other (Comment) Diabetes: No  How often do you need to have someone help you when you read instructions, pamphlets, or other written materials from your doctor or pharmacy?: 1 - Never  Interpreter Needed?: No  Information entered by ::  Makyi Ledo, CMA   Activities of Daily Living    04/19/2023    8:42 AM  In your present state of health, do you have any difficulty performing the following activities:  Hearing? 0  Vision? 0  Difficulty concentrating or making decisions? 0  Walking or climbing stairs? 0  Dressing or bathing? 0  Doing errands, shopping? 0  Preparing Food and eating ? N  Using the Toilet? N  In the past six months, have you accidently leaked urine? N  Do you have problems with loss of bowel control? N  Managing your  Medications? N  Managing your Finances? N  Housekeeping or managing your Housekeeping? N    Patient Care Team: Anabel Halon, MD as PCP - General (Internal Medicine)  Indicate any recent Medical Services you may have received from other than [facility] providers in the past year (date may be approximate).     Assessment:   This is a routine wellness examination for [name].  Hearing/Vision screen Hearing Screening - Comments:: Patient denies any hearing difficulties.   Vision Screening - Comments:: Wears rx  glasses - up to date with routine eye exams with Dr. Arlyce Dice    Goals Addressed             This Visit's Progress    Patient Stated       I want to get back to where I can work in my yard like I used to do.        Depression Screen    04/19/2023    8:35 AM 02/27/2023    3:14 PM 11/21/2022    3:45 PM 08/29/2022    4:27 PM 05/09/2022   11:20 AM 03/29/2022    4:09 PM 03/29/2022    4:08 PM  PHQ 2/9 Scores  PHQ - 2 Score 2 0 0 2 1 1 1   PHQ- 9 Score 6   5       Fall Risk    04/19/2023    8:41 AM 02/27/2023    3:14 PM 11/21/2022    3:43 PM 11/08/2022   11:09 AM 08/29/2022    4:27 PM  Fall Risk   Falls in the past year? 0 0 1 0 1  Number falls in past yr: 0 0 0 0 0  Injury with Fall? 0 0 1 0 1  Risk for fall due to : History of fall(s);Impaired balance/gait;Impaired mobility;Orthopedic patient    Impaired balance/gait;Impaired mobility  Follow up Falls prevention discussed;Education provided    Falls evaluation completed    MEDICARE RISK AT HOME: Medicare Risk at Home Any stairs in or around the home?: Yes If so, are there any without handrails?: No Home free of loose throw rugs in walkways, pet beds, electrical cords, etc?: Yes Adequate lighting in your home to reduce risk of falls?: Yes Life alert?: No Use of a cane, walker or w/c?: Yes Grab bars in the bathroom?: No Shower chair or bench in shower?: No Elevated toilet seat or a handicapped toilet?: No  TIMED UP AND GO:  Was the test performed?  No    Cognitive Function:    03/29/2022    4:10 PM 02/13/2015    8:33 AM  MMSE - Mini Mental State Exam  Not completed: Unable to complete   Orientation to time  4  Orientation to Place  5  Registration  2  Attention/ Calculation  5  Recall  2  Language- name 2 objects  2  Language- repeat  1  Language- follow 3 step command  3  Language- read & follow direction  1  Write a sentence  1  Copy design  0  Total score  26        04/19/2023    8:35 AM  03/29/2022    4:10 PM  6CIT Screen  What Year? 0 points 0 points  What month? 0 points 0 points  What time? 0 points 0 points  Count back from 20 0 points 0 points  Months in reverse 0 points 0 points  Repeat phrase 0 points  0 points  Total Score 0 points 0 points    Immunizations Immunization History  Administered Date(s) Administered   Influenza Split 08/13/2012, 05/11/2013   Influenza, Quadrivalent, Recombinant, Inj, Pf 03/17/2022   Influenza, Seasonal, Injecte, Preservative Fre 02/27/2023   Influenza,inj,Quad PF,6+ Mos 05/11/2015, 03/24/2019, 05/10/2021   Influenza-Unspecified 05/10/2014, 04/17/2017, 03/16/2020   Moderna Covid-19 Vaccine Bivalent Booster 44yrs & up 03/24/2023   PFIZER(Purple Top)SARS-COV-2 Vaccination 09/26/2019, 10/19/2019, 08/12/2020   PNEUMOCOCCAL CONJUGATE-20 07/07/2021   Pfizer Covid-19 Vaccine Bivalent Booster 85yrs & up 05/12/2021   Tdap 04/30/2008, 02/12/2013, 03/24/2023   Zoster Recombinant(Shingrix) 03/17/2022, 08/25/2022    TDAP status: Due, Education has been provided regarding the importance of this vaccine. Advised may receive this vaccine at local pharmacy or Health Dept. Aware to provide a copy of the vaccination record if obtained from local pharmacy or Health Dept. Verbalized acceptance and understanding.  Flu Vaccine status: Up to date  Pneumococcal vaccine status: Not age appropriate for this patient.   Covid-19 vaccine status: Information provided on how to obtain vaccines.   Qualifies for Shingles Vaccine? No   Zostavax completed No   Shingrix Completed?: Yes  Screening Tests Health Maintenance  Topic Date Due   Medicare Annual Wellness (AWV)  03/30/2023   COVID-19 Vaccine (6 - 2023-24 season) 05/19/2023   MAMMOGRAM  02/15/2024   Colonoscopy  08/22/2028   DTaP/Tdap/Td (4 - Td or Tdap) 03/23/2033   INFLUENZA VACCINE  Completed   Hepatitis C Screening  Completed   HIV Screening  Completed   Zoster Vaccines- Shingrix  Completed    HPV VACCINES  Aged Out    Health Maintenance  Health Maintenance Due  Topic Date Due   Medicare Annual Wellness (AWV)  03/30/2023    Colorectal cancer screening: Type of screening: Colonoscopy. Completed 08/22/2018. Repeat every 10 years  Mammogram status: Completed 02/15/2023. Repeat every year  Bone Density Screening: Not age appropriate for this patient.   Lung Cancer Screening: (Low Dose CT Chest recommended if Age 71-80 years, 20 pack-year currently smoking OR have quit w/in 15years.) does not qualify.   Lung Cancer Screening Referral: na  Additional Screening:  Hepatitis C Screening: does not qualify; Completed 11/01/2021  Vision Screening: Recommended annual ophthalmology exams for early detection of glaucoma and other disorders of the eye. Is the patient up to date with their annual eye exam?  Yes  Who is the provider or what is the name of the office in which the patient attends annual eye exams? Dr. Arlyce Dice [place] If pt is not established with a provider, would they like to be referred to a provider to establish care? No .   Dental Screening: Recommended annual dental exams for proper oral hygiene  Diabetic Foot Exam: na  Community Resource Referral / Chronic Care Management: CRR required this visit?  No   CCM required this visit?  No     Plan:     I have personally reviewed and noted the following in the patient's chart:   Medical and social history Use of alcohol, tobacco or illicit drugs  Current medications and supplements including opioid prescriptions. Patient is currently taking opioid prescriptions. Information provided to patient regarding non-opioid alternatives. Patient advised to discuss non-opioid treatment plan with their provider. Functional ability and status Nutritional status Physical activity Advanced directives List of other physicians Hospitalizations, surgeries, and ER visits in previous 12 months Vitals Screenings to include  cognitive, depression, and falls Referrals and appointments  In addition, I have reviewed and discussed  with patient certain preventive protocols, quality metrics, and best practice recommendations. A written personalized care plan for preventive services as well as general preventive health recommendations were provided to patient.     Jordan Hawks Preciliano Castell, CMA   04/19/2023   After Visit Summary: (MyChart) Due to this being a telephonic visit, the after visit summary with patients personalized plan was offered to patient via MyChart

## 2023-04-24 ENCOUNTER — Encounter (INDEPENDENT_AMBULATORY_CARE_PROVIDER_SITE_OTHER): Payer: Self-pay | Admitting: Gastroenterology

## 2023-04-24 ENCOUNTER — Other Ambulatory Visit: Payer: Self-pay | Admitting: Orthopedic Surgery

## 2023-04-24 DIAGNOSIS — G8929 Other chronic pain: Secondary | ICD-10-CM

## 2023-04-24 DIAGNOSIS — R269 Unspecified abnormalities of gait and mobility: Secondary | ICD-10-CM

## 2023-04-24 DIAGNOSIS — M1711 Unilateral primary osteoarthritis, right knee: Secondary | ICD-10-CM

## 2023-04-27 ENCOUNTER — Encounter: Payer: Self-pay | Admitting: Orthopedic Surgery

## 2023-04-30 ENCOUNTER — Encounter: Payer: Self-pay | Admitting: Orthopaedic Surgery

## 2023-04-30 ENCOUNTER — Encounter: Payer: Self-pay | Admitting: Orthopedic Surgery

## 2023-05-01 ENCOUNTER — Telehealth (INDEPENDENT_AMBULATORY_CARE_PROVIDER_SITE_OTHER): Payer: Self-pay | Admitting: *Deleted

## 2023-05-01 NOTE — Telephone Encounter (Signed)
Can she be sent to [facility] or [facility] - with a GI motility specialist? Please ask her if she is open to do this. Thanks

## 2023-05-01 NOTE — Telephone Encounter (Signed)
Spoke to patient - she didn't recall you requesting the referral to [facility] for Dr Ebony Cargo and she didn't recall [facility] calling her to let her know Dr Ebony Cargo next available was in April. I advise patient you had said we can refer to [facility] or [facility] and she states she didn't want to at this time, she had a brace on her leg and doing physical  therapy

## 2023-05-01 NOTE — Telephone Encounter (Signed)
you asked me to send referral in Sept - I called 03/30/23 to check status and they told me they would check in to referral and call me back - I had  message this morning from scheduler and they said Dr Marissa Nestle next available was in April, they called patient is offer an apt and patient told them she couldn't wait that long - they wanted you to know in case you wanted to refer her somewhere else

## 2023-05-02 ENCOUNTER — Encounter (INDEPENDENT_AMBULATORY_CARE_PROVIDER_SITE_OTHER): Payer: Self-pay | Admitting: Gastroenterology

## 2023-05-02 ENCOUNTER — Encounter: Payer: Self-pay | Admitting: Orthopedic Surgery

## 2023-05-03 ENCOUNTER — Encounter: Payer: Self-pay | Admitting: Orthopaedic Surgery

## 2023-05-03 ENCOUNTER — Encounter (INDEPENDENT_AMBULATORY_CARE_PROVIDER_SITE_OTHER): Payer: Self-pay | Admitting: Gastroenterology

## 2023-05-03 NOTE — Telephone Encounter (Signed)
I spoke to patient today, I explained again that she is being referred to [facility] as she needs to have an evaluation by an esophageal motility expert, ideally with an Endoflip testing to determine if she has achalasia or EGJOO..  She understood this.  I explained that she could be seen at [facility] or we could refer her to other places such as [facility] or [facility], but the timeframe to see her may be similar.  She stated that she would wait until her appointment with [facility] in February.    Ann, please reach [facility] and make sure that they keep her appointment.

## 2023-05-04 NOTE — Telephone Encounter (Signed)
Thanks

## 2023-05-04 NOTE — Telephone Encounter (Signed)
Spoke to [facility] and they will five [name] a call to schedule apt and will also put her on their cancellation list

## 2023-05-05 ENCOUNTER — Encounter: Payer: Self-pay | Admitting: Orthopedic Surgery

## 2023-05-05 ENCOUNTER — Ambulatory Visit (HOSPITAL_COMMUNITY): Payer: PPO

## 2023-05-05 NOTE — Therapy (Deleted)
OUTPATIENT PHYSICAL THERAPY LOWER EXTREMITY EVALUATION   Patient Name: [name] MRN: [id number] DOB:13-Apr-1961, 62 y.o., female Today's Date: 05/05/2023  END OF SESSION:   Past Medical History:  Diagnosis Date   Allergy    grass, dust , mold   Anxiety    Arthritis    Asthma due to seasonal allergies 06/15/2020   Bipolar disorder (HCC)    Carpal tunnel syndrome    Bilateral   Chest pain 09/2011   Cardiac cath-normal coronaries   Constipation    Depression    Difficulty urinating 05/31/2013   Elevated LFTs 12/16/2013   Encounter for general adult medical examination with abnormal findings 07/07/2021   GERD (gastroesophageal reflux disease)    History of kidney stones    Hyperlipemia    Hyperlipidemia    Hypertension    Mild; provoked by stress and anxiety   IBS (irritable bowel syndrome)    Intracerebral bleed (HCC)    No aneurysm; followed by Dr. Athena Masse replaced    "lense transplant" 2022; pt states lense don't dilate or constrict   Loss of weight 01/06/2015   Osteoporosis    Stage 3a chronic kidney disease (HCC) 03/16/2021   Stroke (HCC) 1999   hemorrhagic stroke; weakness of left side   Past Surgical History:  Procedure Laterality Date   BIOPSY  04/16/2021   Procedure: BIOPSY;  Surgeon: Marguerita Merles, Reuel Boom, MD;  Location: [facility];  Service: Gastroenterology;;  small, bowel, esophageal(proximal and distal);   BRAIN SURGERY  1999   to remove blood clot after stroke    CARDIAC CATHETERIZATION  2016   CERVICAL FUSION     CHOLECYSTECTOMY N/A 10/14/2014   Procedure: LAPAROSCOPIC CHOLECYSTECTOMY WITH INTRAOPERATIVE CHOLANGIOGRAM;  Surgeon: Avel Peace, MD;  Location: [facility] OR;  Service: General;  Laterality: N/A;   CHONDROPLASTY Right 07/13/2017   Procedure: CHONDROPLASTY of patella;  Surgeon: Vickki Hearing, MD;  Location: [facility];  Service: Orthopedics;  Laterality: Right;   ESOPHAGEAL DILATION N/A 04/16/2021   Procedure:  ESOPHAGEAL DILATION;  Surgeon: Dolores Frame, MD;  Location: [facility];  Service: Gastroenterology;  Laterality: N/A;   ESOPHAGOGASTRODUODENOSCOPY (EGD) WITH PROPOFOL N/A 04/16/2021   Procedure: ESOPHAGOGASTRODUODENOSCOPY (EGD) WITH PROPOFOL;  Surgeon: Dolores Frame, MD;  Location: [facility];  Service: Gastroenterology;  Laterality: N/A;  1:35, pt knows to arrive at 9:45   EUS N/A 08/21/2015   Procedure: ESOPHAGEAL ENDOSCOPIC ULTRASOUND (EUS) RADIAL;  Surgeon: [name], MD;  Location: [facility];  Service: Endoscopy;  Laterality: N/A;   KNEE ARTHROSCOPY WITH MEDIAL MENISECTOMY Right 07/13/2017   Procedure: KNEE ARTHROSCOPY WITH PARTIAL MEDIAL MENISECTOMY;  Surgeon: Vickki Hearing, MD;  Location: [facility];  Service: Orthopedics;  Laterality: Right;   LEFT HEART CATHETERIZATION WITH CORONARY ANGIOGRAM N/A 09/23/2011   Procedure: LEFT HEART CATHETERIZATION WITH CORONARY ANGIOGRAM;  Surgeon: Vesta Mixer, MD;  Location: [facility] CATH LAB;  Service: Cardiovascular;  Laterality: N/A;   LIPOMA EXCISION Left 11/18/2013   Procedure: EXCISION OF SOFT TISSUE MASS-LEFT THIGH;  Surgeon: Dalia Heading, MD;  Location: [facility];  Service: General;  Laterality: Left;   NASAL SEPTOPLASTY W/ TURBINOPLASTY Bilateral 08/30/2021   Procedure: NASAL SEPTOPLASTY WITH BILATERAL TURBINATE REDUCTION;  Surgeon: Newman Pies, MD;  Location: [facility];  Service: ENT;  Laterality: Bilateral;   POLYPECTOMY  04/16/2021   Procedure: POLYPECTOMY;  Surgeon: Dolores Frame, MD;  Location: [facility];  Service: Gastroenterology;;  gastric   RECTOCELE REPAIR  x2   RECTOCELE REPAIR N/A 04/04/2017   Procedure: POSTERIOR REPAIR (RECTOCELE);  Surgeon: Tilda Burrow, MD;  Location: [facility];  Service: Gynecology;  Laterality: N/A;   TOTAL ABDOMINAL HYSTERECTOMY     Patient Active Problem List   Diagnosis Date Noted   Small intestinal bacterial overgrowth (SIBO) 12/08/2022    Chronic fatigue 11/09/2022   Family history of breast cancer 11/08/2022   Osteoporosis with current pathological fracture 08/29/2022   Intestinal methanogen overgrowth 04/28/2022   History of total abdominal hysterectomy 03/28/2022   Dystrophia unguium 02/01/2022   Lumbar compression fracture (HCC) 12/23/2021   Lumbar spondylosis 11/04/2021   Encounter for general adult medical examination with abnormal findings 07/07/2021   Gastric polyp 05/31/2021   NASH (nonalcoholic steatohepatitis) 04/01/2021   Dysphagia 04/01/2021   Stage 3a chronic kidney disease (HCC) 03/16/2021   Arthritis 01/31/2021   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   History of hemorrhagic stroke with residual hemiplegia (HCC) 06/15/2020   Urinary incontinence 06/15/2020   Asthma due to seasonal allergies 06/15/2020   Vaginal itching 03/16/2020   S/P right knee arthroscopy 07/13/17 07/20/2017   Derangement of posterior horn of medial meniscus of right knee    Chondromalacia, patella, right    Irritable bowel syndrome with constipation and diarrhea 05/11/2015   Gait instability 01/06/2015   DDD (degenerative disc disease), lumbar 02/18/2014   Postmenopausal atrophic vaginitis 11/01/2013   Lipoma of abdominal wall 08/14/2013   Back pain 05/29/2013   Paresthesia of foot 02/12/2013   Hypertension    Depression 11/17/2011   Insomnia 11/17/2011   Mixed hyperlipidemia 09/20/2011   Anxiety 09/20/2011    PCP: Anabel Halon, MD   REFERRING PROVIDER: Vickki Hearing, MD   REFERRING DIAG:  [phone] (ICD-10-CM) - Chronic pain of right knee  M17.11 (ICD-10-CM) - Primary osteoarthritis of right knee  R26.9 (ICD-10-CM) - Gait disturbance    THERAPY DIAG:  No diagnosis found.  Rationale for Evaluation and Treatment: {HABREHAB:27488}  ONSET DATE: ***  SUBJECTIVE:   SUBJECTIVE STATEMENT: ***  PERTINENT HISTORY: *** PAIN:  Are you having pain? {OPRCPAIN:27236}  PRECAUTIONS: {Therapy  precautions:24002}  RED FLAGS: {PT Red Flags:29287}   WEIGHT BEARING RESTRICTIONS: {Yes ***/No:24003}  FALLS:  Has patient fallen in last 6 months? {fallsyesno:27318}  LIVING ENVIRONMENT: Lives with: {OPRC lives with:25569::"lives with their family"} Lives in: {Lives in:25570} Stairs: {opstairs:27293} Has following equipment at home: {Assistive devices:23999}  OCCUPATION: ***  PLOF: {PLOF:24004}  PATIENT GOALS: ***  NEXT MD VISIT: 06/22/2023 Romeo Apple  OBJECTIVE:  Note: Objective measures were completed at Evaluation unless otherwise noted.  DIAGNOSTIC FINDINGS: ***  PATIENT SURVEYS:  {rehab surveys:24030}  COGNITION: Overall cognitive status: {cognition:24006}     SENSATION: {sensation:27233}  EDEMA:  {edema:24020}  MUSCLE LENGTH: Hamstrings: Right *** deg; Left *** deg Maisie Fus test: Right *** deg; Left *** deg  POSTURE: {posture:25561}  PALPATION: ***  LOWER EXTREMITY ROM:  {AROM/PROM:27142} ROM Right eval Left eval  Hip flexion    Hip extension    Hip abduction    Hip adduction    Hip internal rotation    Hip external rotation    Knee flexion    Knee extension    Ankle dorsiflexion    Ankle plantarflexion    Ankle inversion    Ankle eversion     (Blank rows = not tested)  LOWER EXTREMITY MMT:  MMT Right eval Left eval  Hip flexion    Hip extension    Hip abduction    Hip adduction  Hip internal rotation    Hip external rotation    Knee flexion    Knee extension    Ankle dorsiflexion    Ankle plantarflexion    Ankle inversion    Ankle eversion     (Blank rows = not tested)  LOWER EXTREMITY SPECIAL TESTS:  {LEspecialtests:26242}  FUNCTIONAL TESTS:  {Functional tests:24029}  GAIT: Distance walked: *** Assistive device utilized: {Assistive devices:23999} Level of assistance: {Levels of assistance:24026} Comments: ***   TODAY'S TREATMENT:                                                                                                                               DATE: 05/05/2023    PATIENT EDUCATION:  Education details: *** Person educated: {Person educated:25204} Education method: {Education Method:25205} Education comprehension: {Education Comprehension:25206}  HOME EXERCISE PROGRAM: ***  ASSESSMENT:  CLINICAL IMPRESSION: Patient is a 62 y.o. female who was seen today for physical therapy evaluation and treatment for ***.   OBJECTIVE IMPAIRMENTS: {opptimpairments:25111}.   ACTIVITY LIMITATIONS: {activitylimitations:27494}  PARTICIPATION LIMITATIONS: {participationrestrictions:25113}  PERSONAL FACTORS: {Personal factors:25162} are also affecting patient's functional outcome.   REHAB POTENTIAL: {rehabpotential:25112}  CLINICAL DECISION MAKING: {clinical decision making:25114}  EVALUATION COMPLEXITY: {Evaluation complexity:25115}   GOALS: Goals reviewed with patient? {yes/no:20286}  SHORT TERM GOALS: Target date: *** *** Baseline: Goal status: INITIAL  2.  *** Baseline:  Goal status: INITIAL  3.  *** Baseline:  Goal status: INITIAL  4.  *** Baseline:  Goal status: INITIAL  5.  *** Baseline:  Goal status: INITIAL  6.  *** Baseline:  Goal status: INITIAL  LONG TERM GOALS: Target date: ***  *** Baseline:  Goal status: INITIAL  2.  *** Baseline:  Goal status: INITIAL  3.  *** Baseline:  Goal status: INITIAL  4.  *** Baseline:  Goal status: INITIAL  5.  *** Baseline:  Goal status: INITIAL  6.  *** Baseline:  Goal status: INITIAL   PLAN:  PT FREQUENCY: {rehab frequency:25116}  PT DURATION: {rehab duration:25117}  PLANNED INTERVENTIONS: {rehab planned interventions:25118::"97110-Therapeutic exercises","97530- Therapeutic [phone]- Neuromuscular re-education","97535- Self ZHYQ","65784- Manual therapy"}  PLAN FOR NEXT SESSION: ***

## 2023-05-06 ENCOUNTER — Encounter: Payer: Self-pay | Admitting: Orthopedic Surgery

## 2023-05-06 ENCOUNTER — Encounter: Payer: Self-pay | Admitting: Internal Medicine

## 2023-05-08 ENCOUNTER — Encounter: Payer: Self-pay | Admitting: Internal Medicine

## 2023-05-11 ENCOUNTER — Other Ambulatory Visit: Payer: Self-pay | Admitting: Internal Medicine

## 2023-05-11 DIAGNOSIS — M62838 Other muscle spasm: Secondary | ICD-10-CM

## 2023-05-13 ENCOUNTER — Encounter: Payer: Self-pay | Admitting: Orthopedic Surgery

## 2023-05-13 ENCOUNTER — Encounter: Payer: Self-pay | Admitting: Orthopaedic Surgery

## 2023-05-13 ENCOUNTER — Encounter: Payer: Self-pay | Admitting: Allergy & Immunology

## 2023-05-13 DIAGNOSIS — R269 Unspecified abnormalities of gait and mobility: Secondary | ICD-10-CM

## 2023-05-13 DIAGNOSIS — M1711 Unilateral primary osteoarthritis, right knee: Secondary | ICD-10-CM

## 2023-05-23 ENCOUNTER — Encounter: Payer: Self-pay | Admitting: Internal Medicine

## 2023-05-23 ENCOUNTER — Other Ambulatory Visit: Payer: Self-pay

## 2023-05-23 DIAGNOSIS — E782 Mixed hyperlipidemia: Secondary | ICD-10-CM

## 2023-05-23 MED ORDER — EZETIMIBE 10 MG PO TABS: 10.0000 mg | ORAL_TABLET | Freq: Every day | ORAL | 3 refills | Status: DC

## 2023-05-24 ENCOUNTER — Telehealth (INDEPENDENT_AMBULATORY_CARE_PROVIDER_SITE_OTHER): Payer: Self-pay | Admitting: Gastroenterology

## 2023-05-24 NOTE — Telephone Encounter (Signed)
Room 1 Thanks

## 2023-05-24 NOTE — Telephone Encounter (Signed)
Who is your primary care physician: Dr.Patel  EGD 2 year recall  Are you diabetic? If yes, Type 1 or Type 2?    No  Do you have a prosthetic or mechanical heart valve? no  Do you have a pacemaker/defibrillator?   no  Have you had endocarditis/atrial fibrillation? no  Have you had joint replacement within the last 12 months?  no  Do you tend to be constipated or have to use laxatives? yes  Do you have any history of drugs or alchohol?  no  Do you use supplemental oxygen?  no  Have you had a stroke or heart attack within the last 6 months? no  Do you take weight loss medication?  no  For female patients: have you had a hysterectomy?  yes                                     are you post menopausal?       no                                            do you still have your menstrual cycle?     no  Do you take any blood-thinning medications such as: (aspirin, warfarin, Plavix, Aggrenox)  no  If yes we need the name, milligram, dosage and who is prescribing doctor -can't take aspirin Current Outpatient Medications on File Prior to Visit  Medication Sig Dispense Refill   acetaminophen (TYLENOL 8 HOUR) 650 MG CR tablet Take 650 mg by mouth every 8 (eight) hours.     acetaminophen (TYLENOL) 500 MG tablet Take 500 mg by mouth. As needed     ALPRAZolam (XANAX) 1 MG tablet Take 1 tablet (1 mg total) by mouth 3 (three) times daily. 60 tablet 2   atorvastatin (LIPITOR) 40 MG tablet Take 1 tablet (40 mg total) by mouth daily. 90 tablet 3   Blood Glucose Monitoring Suppl DEVI 1 each by Does not apply route in the morning, at noon, and at bedtime. May substitute to any manufacturer covered by patient's insurance. 1 each 0   Calcium 500-125 MG-UNIT TABS Take 1 tablet by mouth daily with supper.     Carboxymethylcellulose Sod PF 0.5 % SOLN Place 1 drop into both eyes daily as needed (dry eyes).     cetirizine (ZYRTEC) 10 MG tablet Take 10 mg by mouth daily.     cyclobenzaprine (FLEXERIL) 5 MG  tablet TAKE 1 TABLET BY MOUTH THREE TIMES DAILY AS NEEDED FOR MUSCLE SPASMS. 90 tablet 1   diclofenac Sodium (VOLTAREN) 1 % GEL Apply 1 application topically daily as needed (pain).     estradiol (ESTRACE) 0.1 MG/GM vaginal cream Place 0.5 g vaginally 2 (two) times a week. Place 0.5g nightly for two weeks then twice a week after 30 g 11   ezetimibe (ZETIA) 10 MG tablet Take 1 tablet (10 mg total) by mouth daily. 90 tablet 3   famotidine (PEPCID) 20 MG tablet Take 20 mg by mouth 2 (two) times daily. As needed     ipratropium (ATROVENT) 0.03 % nasal spray Place 2 sprays into both nostrils every 12 (twelve) hours. 30 mL 6   lidocaine-prilocaine (EMLA) cream Apply 1 Application topically at bedtime.     lisinopril (ZESTRIL) 20 MG tablet Take  1 tablet (20 mg total) by mouth daily. 90 tablet 1   methylphenidate (CONCERTA) 36 MG PO CR tablet Take 1 tablet (36 mg total) by mouth daily. 30 tablet 0   methylphenidate (CONCERTA) 36 MG PO CR tablet Take 1 tablet (36 mg total) by mouth daily. 30 tablet 0   mirabegron ER (MYRBETRIQ) 50 MG TB24 tablet Take 1 tablet (50 mg total) by mouth daily. 30 tablet 5   montelukast (SINGULAIR) 10 MG tablet Take 1 tablet (10 mg total) by mouth at bedtime. 30 tablet 5   Multiple Vitamin (MULITIVITAMIN WITH MINERALS) TABS Take 1 tablet by mouth daily with breakfast.     Nystatin POWD by Does not apply route. As needed.     Olopatadine HCl 0.2 % SOLN Place 1 drop into both eyes in the morning and at bedtime.     Omega-3 Fatty Acids (FISH OIL) 1000 MG CAPS Take 2,000 mg by mouth.     omeprazole (PRILOSEC) 40 MG capsule Take 1 capsule (40 mg total) by mouth 2 (two) times daily. 60 capsule 3   OVER THE COUNTER MEDICATION Milk thistle once per day.  Mushrooms once daily.  Vit K 2 once per day.  Beet root gummies daily     pregabalin (LYRICA) 25 MG capsule TAKE ONE CAPSULE BY MOUTH EVERY DAY 30 capsule 2   Probiotic Product (TRUBIOTICS PO) Take 1 capsule by mouth daily. Takes  25 cfu daily.     pyridOXINE (VITAMIN B-6) 100 MG tablet Take 100 mg by mouth daily.     rifaximin (XIFAXAN) 550 MG TABS tablet Take 1 tablet (550 mg total) by mouth 3 (three) times daily. 42 tablet 0   traMADol (ULTRAM) 50 MG tablet Take 1 tablet (50 mg total) by mouth every 12 (twelve) hours as needed. 30 tablet 0   traMADol-acetaminophen (ULTRACET) 37.5-325 MG tablet Take 1 tablet by mouth every 4 (four) hours as needed. 90 tablet 5   traZODone (DESYREL) 100 MG tablet TAKE (2) TABLETS BY MOUTH AT BEDTIME. 60 tablet 2   TYMLOS 3120 MCG/1.56ML SOPN One qhs     venlafaxine XR (EFFEXOR XR) 150 MG 24 hr capsule Take 1 capsule (150 mg total) by mouth daily with breakfast. 90 capsule 2   No current facility-administered medications on file prior to visit.    Allergies  Allergen Reactions   Morphine And Codeine Hives   Promethazine Hcl Other (See Comments)    Causes patient to become Hyper     Primary Insurance Name: [employer]  Best number where you can be reached: [phone]

## 2023-05-25 ENCOUNTER — Encounter (INDEPENDENT_AMBULATORY_CARE_PROVIDER_SITE_OTHER): Payer: Self-pay

## 2023-05-25 ENCOUNTER — Telehealth: Payer: Self-pay | Admitting: Internal Medicine

## 2023-05-25 NOTE — Telephone Encounter (Signed)
Returned call to pt but had to leave voicemail. Will send my chart message

## 2023-05-25 NOTE — Telephone Encounter (Signed)
[name] A2 minutes ago (1:04 PM)    Patient left a message at [facility]. About returning a call to schedule her colonoscopy.

## 2023-05-25 NOTE — Telephone Encounter (Signed)
Left message to return call 

## 2023-05-25 NOTE — Telephone Encounter (Signed)
Pt returned call. Pt scheduled for 06/30/23 at 12:45pm with Dr.Castaneda. instructions will be mailed. No pa needed.

## 2023-05-25 NOTE — Telephone Encounter (Signed)
Questionnaire from recall, no referral needed  

## 2023-05-25 NOTE — Telephone Encounter (Signed)
Patient left a message at [facility]. About returning a call to schedule her colonoscopy.

## 2023-05-29 ENCOUNTER — Ambulatory Visit: Payer: PPO | Admitting: Orthopedic Surgery

## 2023-05-29 DIAGNOSIS — M25561 Pain in right knee: Secondary | ICD-10-CM

## 2023-05-29 DIAGNOSIS — M21372 Foot drop, left foot: Secondary | ICD-10-CM | POA: Diagnosis not present

## 2023-05-29 DIAGNOSIS — M205X9 Other deformities of toe(s) (acquired), unspecified foot: Secondary | ICD-10-CM

## 2023-05-29 DIAGNOSIS — G8929 Other chronic pain: Secondary | ICD-10-CM | POA: Diagnosis not present

## 2023-05-30 ENCOUNTER — Other Ambulatory Visit: Payer: Self-pay | Admitting: Allergy & Immunology

## 2023-05-30 ENCOUNTER — Encounter: Payer: Self-pay | Admitting: Orthopedic Surgery

## 2023-05-30 NOTE — Progress Notes (Signed)
Office Visit Note   Patient: [name]           Date of Birth: 1960-12-23           MRN: [id number] Visit Date: 05/29/2023              Requested by: Anabel Halon, MD [address] [place],  [place] [address] PCP: Anabel Halon, MD  Chief Complaint  Patient presents with   Right Foot - Follow-up   Left Foot - Follow-up      HPI: Patient is a 62 year old woman who presents with right knee pain, leg length inequality, Achilles contracture, claw toes, bilateral foot pain.  Patient states that she is status post stroke causing foot drop on the left.  Patient has arthritis in the right knee and has been wearing a knee brace on the right.  Patient was recently recommended sole orthotics.  Patient obtained some cork orthotics that she states that has caused foot pain.  Assessment & Plan: Visit Diagnoses:  1. Foot drop, left foot   2. Contracture of both Achilles tendons   3. Foot drop, left   4. Chronic pain of right knee   5. Claw toe, acquired, unspecified laterality     Plan: Patient will follow-up with physical therapy for strengthening of the right knee.  Patient was added a prescription for strengthening for both lower extremities.  Follow-Up Instructions: Return in about 4 weeks (around 06/26/2023).   Ortho Exam  Patient is alert, oriented, no adenopathy, well-dressed, normal affect, normal respiratory effort. Examination patient has some old custom orthotics with plastic arch and heel lift.  She does have a high cavovarus foot with clawing of her toes.  Patient does have a 1/4 inch heel lift with the left leg shorter.  She does have an antalgic gait walking with a flexed knee gait on the right.  She has a palpable dorsalis pedis pulse bilaterally no evidence of arterial insufficiency.  Dorsiflexion of the ankle past neutral.  Patient has weakness with dorsiflexion on the left.  Imaging: No results found. No images are attached to the encounter.  Labs: Lab  Results  Component Value Date   HGBA1C 5.7 (H) 02/10/2023   HGBA1C 5.8 (H) 08/25/2022   HGBA1C 5.5 11/01/2021   ESRSEDRATE 2 02/13/2015   LABORGA Multiple bacterial morphotypes present, none 06/24/2015   LABORGA predominant. Suggest appropriate recollection if 06/24/2015   LABORGA clinically indicated. 06/24/2015     Lab Results  Component Value Date   ALBUMIN 4.8 02/10/2023   ALBUMIN 4.6 08/25/2022   ALBUMIN 4.7 11/01/2021    No results found for: "MG" Lab Results  Component Value Date   VD25OH 86.6 02/10/2023   VD25OH 77.5 08/25/2022   VD25OH 53.4 11/01/2021    No results found for: "PREALBUMIN"    Latest Ref Rng & Units 02/10/2023    8:52 AM 08/25/2022    8:14 AM 11/01/2021    8:32 AM  CBC EXTENDED  WBC 3.4 - 10.8 x10E3/uL 8.0  11.5  6.1   RBC 3.77 - 5.28 x10E6/uL 4.04  3.77  3.77   Hemoglobin 11.1 - 15.9 g/dL 02.5  42.7  06.2   HCT 34.0 - 46.6 % 39.6  36.7  36.5   Platelets 150 - 450 x10E3/uL 326  492  349   NEUT# 1.4 - 7.0 x10E3/uL  8.4  3.4   Lymph# 0.7 - 3.1 x10E3/uL  2.1  1.9      There  is no height or weight on file to calculate BMI.  Orders:  Orders Placed This Encounter  Procedures   Ambulatory referral to Physical Therapy   No orders of the defined types were placed in this encounter.    Procedures: No procedures performed  Clinical Data: No additional findings.  ROS:  All other systems negative, except as noted in the HPI. Review of Systems  Objective: Vital Signs: There were no vitals taken for this visit.  Specialty Comments:  No specialty comments available.  PMFS History: Patient Active Problem List   Diagnosis Date Noted   Small intestinal bacterial overgrowth (SIBO) 12/08/2022   Chronic fatigue 11/09/2022   Family history of breast cancer 11/08/2022   Osteoporosis with current pathological fracture 08/29/2022   Intestinal methanogen overgrowth 04/28/2022   History of total abdominal hysterectomy 03/28/2022   Dystrophia  unguium 02/01/2022   Lumbar compression fracture (HCC) 12/23/2021   Lumbar spondylosis 11/04/2021   Encounter for general adult medical examination with abnormal findings 07/07/2021   Gastric polyp 05/31/2021   NASH (nonalcoholic steatohepatitis) 04/01/2021   Dysphagia 04/01/2021   Stage 3a chronic kidney disease (HCC) 03/16/2021   Arthritis 01/31/2021   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   History of hemorrhagic stroke with residual hemiplegia (HCC) 06/15/2020   Urinary incontinence 06/15/2020   Asthma due to seasonal allergies 06/15/2020   Vaginal itching 03/16/2020   S/P right knee arthroscopy 07/13/17 07/20/2017   Derangement of posterior horn of medial meniscus of right knee    Chondromalacia, patella, right    Irritable bowel syndrome with constipation and diarrhea 05/11/2015   Gait instability 01/06/2015   DDD (degenerative disc disease), lumbar 02/18/2014   Postmenopausal atrophic vaginitis 11/01/2013   Lipoma of abdominal wall 08/14/2013   Back pain 05/29/2013   Paresthesia of foot 02/12/2013   Hypertension    Depression 11/17/2011   Insomnia 11/17/2011   Mixed hyperlipidemia 09/20/2011   Anxiety 09/20/2011   Past Medical History:  Diagnosis Date   Allergy    grass, dust , mold   Anxiety    Arthritis    Asthma due to seasonal allergies 06/15/2020   Bipolar disorder (HCC)    Carpal tunnel syndrome    Bilateral   Chest pain 09/2011   Cardiac cath-normal coronaries   Constipation    Depression    Difficulty urinating 05/31/2013   Elevated LFTs 12/16/2013   Encounter for general adult medical examination with abnormal findings 07/07/2021   GERD (gastroesophageal reflux disease)    History of kidney stones    Hyperlipemia    Hyperlipidemia    Hypertension    Mild; provoked by stress and anxiety   IBS (irritable bowel syndrome)    Intracerebral bleed (HCC)    No aneurysm; followed by Dr. Athena Masse replaced    "lense transplant" 2022; pt states lense don't  dilate or constrict   Loss of weight 01/06/2015   Osteoporosis    Stage 3a chronic kidney disease (HCC) 03/16/2021   Stroke (HCC) 1999   hemorrhagic stroke; weakness of left side    Family History  Problem Relation Age of Onset   Cancer Mother        breast    Hypertension Mother    Hyperlipidemia Mother    Depression Mother    Anxiety disorder Mother    COPD Mother    Arthritis Mother        rheumatoid   Drug abuse Sister    Coronary artery disease Paternal Grandfather  Coronary artery disease Paternal Uncle    Depression Cousin    Drug abuse Cousin     Past Surgical History:  Procedure Laterality Date   BIOPSY  04/16/2021   Procedure: BIOPSY;  Surgeon: Marguerita Merles, Reuel Boom, MD;  Location: [facility];  Service: Gastroenterology;;  small, bowel, esophageal(proximal and distal);   BRAIN SURGERY  1999   to remove blood clot after stroke    CARDIAC CATHETERIZATION  2016   CERVICAL FUSION     CHOLECYSTECTOMY N/A 10/14/2014   Procedure: LAPAROSCOPIC CHOLECYSTECTOMY WITH INTRAOPERATIVE CHOLANGIOGRAM;  Surgeon: Avel Peace, MD;  Location: [facility] OR;  Service: General;  Laterality: N/A;   CHONDROPLASTY Right 07/13/2017   Procedure: CHONDROPLASTY of patella;  Surgeon: Vickki Hearing, MD;  Location: [facility];  Service: Orthopedics;  Laterality: Right;   ESOPHAGEAL DILATION N/A 04/16/2021   Procedure: ESOPHAGEAL DILATION;  Surgeon: Dolores Frame, MD;  Location: [facility];  Service: Gastroenterology;  Laterality: N/A;   ESOPHAGOGASTRODUODENOSCOPY (EGD) WITH PROPOFOL N/A 04/16/2021   Procedure: ESOPHAGOGASTRODUODENOSCOPY (EGD) WITH PROPOFOL;  Surgeon: Dolores Frame, MD;  Location: [facility];  Service: Gastroenterology;  Laterality: N/A;  1:35, pt knows to arrive at 9:45   EUS N/A 08/21/2015   Procedure: ESOPHAGEAL ENDOSCOPIC ULTRASOUND (EUS) RADIAL;  Surgeon: [name], MD;  Location: [facility];  Service: Endoscopy;  Laterality: N/A;    KNEE ARTHROSCOPY WITH MEDIAL MENISECTOMY Right 07/13/2017   Procedure: KNEE ARTHROSCOPY WITH PARTIAL MEDIAL MENISECTOMY;  Surgeon: Vickki Hearing, MD;  Location: [facility];  Service: Orthopedics;  Laterality: Right;   LEFT HEART CATHETERIZATION WITH CORONARY ANGIOGRAM N/A 09/23/2011   Procedure: LEFT HEART CATHETERIZATION WITH CORONARY ANGIOGRAM;  Surgeon: Vesta Mixer, MD;  Location: [facility];  Service: Cardiovascular;  Laterality: N/A;   LIPOMA EXCISION Left 11/18/2013   Procedure: EXCISION OF SOFT TISSUE MASS-LEFT THIGH;  Surgeon: Dalia Heading, MD;  Location: [facility];  Service: General;  Laterality: Left;   NASAL SEPTOPLASTY W/ TURBINOPLASTY Bilateral 08/30/2021   Procedure: NASAL SEPTOPLASTY WITH BILATERAL TURBINATE REDUCTION;  Surgeon: Newman Pies, MD;  Location: [facility];  Service: ENT;  Laterality: Bilateral;   POLYPECTOMY  04/16/2021   Procedure: POLYPECTOMY;  Surgeon: Dolores Frame, MD;  Location: [facility];  Service: Gastroenterology;;  gastric   RECTOCELE REPAIR     x2   RECTOCELE REPAIR N/A 04/04/2017   Procedure: POSTERIOR REPAIR (RECTOCELE);  Surgeon: Tilda Burrow, MD;  Location: [facility];  Service: Gynecology;  Laterality: N/A;   TOTAL ABDOMINAL HYSTERECTOMY     Social History   Occupational History   Occupation: unemployed    Comment: pending disability  Tobacco Use   Smoking status: Former    Current packs/day: 0.00    Average packs/day: 1 pack/day for 19.0 years (19.0 ttl pk-yrs)    Types: Cigarettes    Start date: 09/02/1978    Quit date: 09/01/1997    Years since quitting: 25.7    Passive exposure: Past   Smokeless tobacco: Never   Tobacco comments:    Quit smoking 1999 , previous 20 pack years  Vaping Use   Vaping status: Never Used  Substance and Sexual Activity   Alcohol use: Yes    Comment: 1 drink every other week   Drug use: No   Sexual activity: Not Currently    Partners: Male    Birth control/protection:  Surgical    Comment: hyst

## 2023-05-31 ENCOUNTER — Other Ambulatory Visit (HOSPITAL_COMMUNITY): Payer: Self-pay | Admitting: Psychiatry

## 2023-06-07 ENCOUNTER — Other Ambulatory Visit: Payer: Self-pay | Admitting: Internal Medicine

## 2023-06-07 DIAGNOSIS — M47816 Spondylosis without myelopathy or radiculopathy, lumbar region: Secondary | ICD-10-CM

## 2023-06-07 MED ORDER — PREGABALIN 25 MG PO CAPS: 25.0000 mg | ORAL_CAPSULE | Freq: Every day | ORAL | 3 refills | Status: DC

## 2023-06-08 ENCOUNTER — Telehealth (INDEPENDENT_AMBULATORY_CARE_PROVIDER_SITE_OTHER): Payer: Self-pay | Admitting: Gastroenterology

## 2023-06-08 NOTE — Telephone Encounter (Signed)
Thanks

## 2023-06-08 NOTE — Telephone Encounter (Signed)
Pt has cancelled EGD for 06/30/23 at 12:45pm. Pt states she is not able to fast that long. Pt would like early morning appt. Will call pt with January schedule once that is available.

## 2023-06-13 ENCOUNTER — Encounter: Payer: Self-pay | Admitting: Orthopedic Surgery

## 2023-06-14 ENCOUNTER — Encounter (INDEPENDENT_AMBULATORY_CARE_PROVIDER_SITE_OTHER): Payer: Self-pay | Admitting: Gastroenterology

## 2023-06-15 ENCOUNTER — Encounter (INDEPENDENT_AMBULATORY_CARE_PROVIDER_SITE_OTHER): Payer: Self-pay

## 2023-06-15 ENCOUNTER — Encounter: Payer: Self-pay | Admitting: Orthopaedic Surgery

## 2023-06-15 NOTE — Telephone Encounter (Signed)
My chart message sent to pt to see if she was available 07/21/23 at 9:45am. Pt responded back. Please see my chart message. Will send instructions via mail and on my chart.

## 2023-06-18 ENCOUNTER — Encounter (HOSPITAL_COMMUNITY): Payer: Self-pay

## 2023-06-19 ENCOUNTER — Encounter (HOSPITAL_COMMUNITY): Payer: Self-pay

## 2023-06-20 ENCOUNTER — Other Ambulatory Visit (HOSPITAL_COMMUNITY): Payer: Self-pay | Admitting: Psychiatry

## 2023-06-20 ENCOUNTER — Telehealth (HOSPITAL_COMMUNITY): Payer: Self-pay

## 2023-06-20 MED ORDER — ALPRAZOLAM 1 MG PO TABS: 1.0000 mg | ORAL_TABLET | Freq: Three times a day (TID) | ORAL | 2 refills | Status: DC

## 2023-06-20 NOTE — Telephone Encounter (Signed)
Pt is requesting a refill on her ALPRAZolam Prudy Feeler) 1 MG tablet sent to [facility]. Pt is scheduled for 07/06/22. Please advise.

## 2023-06-20 NOTE — Telephone Encounter (Signed)
sent 

## 2023-06-21 ENCOUNTER — Other Ambulatory Visit (HOSPITAL_COMMUNITY): Payer: Self-pay

## 2023-06-21 NOTE — Telephone Encounter (Signed)
Spoke with pt advised rx has been sent

## 2023-06-22 ENCOUNTER — Other Ambulatory Visit: Payer: Self-pay | Admitting: Internal Medicine

## 2023-06-22 ENCOUNTER — Ambulatory Visit: Payer: PPO | Admitting: Orthopedic Surgery

## 2023-06-22 DIAGNOSIS — E782 Mixed hyperlipidemia: Secondary | ICD-10-CM

## 2023-06-23 ENCOUNTER — Encounter: Payer: Self-pay | Admitting: Orthopedic Surgery

## 2023-06-25 ENCOUNTER — Encounter: Payer: Self-pay | Admitting: Allergy & Immunology

## 2023-06-25 ENCOUNTER — Encounter: Payer: Self-pay | Admitting: Internal Medicine

## 2023-06-26 ENCOUNTER — Other Ambulatory Visit: Payer: Self-pay

## 2023-06-26 MED ORDER — MONTELUKAST SODIUM 10 MG PO TABS: 10.0000 mg | ORAL_TABLET | Freq: Every day | ORAL | 5 refills | Status: AC

## 2023-06-30 ENCOUNTER — Encounter (HOSPITAL_COMMUNITY): Payer: Self-pay

## 2023-06-30 ENCOUNTER — Ambulatory Visit (HOSPITAL_COMMUNITY): Admit: 2023-06-30 | Payer: PPO | Admitting: Gastroenterology

## 2023-06-30 ENCOUNTER — Other Ambulatory Visit: Payer: Self-pay | Admitting: Internal Medicine

## 2023-06-30 DIAGNOSIS — M62838 Other muscle spasm: Secondary | ICD-10-CM

## 2023-06-30 SURGERY — ESOPHAGOGASTRODUODENOSCOPY (EGD) WITH PROPOFOL
Anesthesia: Monitor Anesthesia Care

## 2023-07-03 ENCOUNTER — Ambulatory Visit: Payer: PPO | Admitting: Internal Medicine

## 2023-07-04 ENCOUNTER — Ambulatory Visit: Payer: PPO | Admitting: Orthopaedic Surgery

## 2023-07-06 ENCOUNTER — Encounter: Payer: Self-pay | Admitting: Orthopedic Surgery

## 2023-07-06 ENCOUNTER — Ambulatory Visit: Payer: PPO | Admitting: Orthopedic Surgery

## 2023-07-06 ENCOUNTER — Other Ambulatory Visit (INDEPENDENT_AMBULATORY_CARE_PROVIDER_SITE_OTHER): Payer: Self-pay

## 2023-07-06 DIAGNOSIS — G8929 Other chronic pain: Secondary | ICD-10-CM

## 2023-07-06 DIAGNOSIS — M25562 Pain in left knee: Secondary | ICD-10-CM | POA: Diagnosis not present

## 2023-07-06 NOTE — Progress Notes (Signed)
kn

## 2023-07-06 NOTE — Progress Notes (Signed)
 FOLLOW-UP OFFICE VISIT   Patient: [name]           Date of Birth: [birth year]           MRN: [id number] Visit Date: 07/06/2023 Requested by: [name], MD [address] [place],  [place] [address] PCP: [name], MD    Encounter Diagnosis  Name Primary?   Chronic pain of left knee Yes    Chief Complaint  Patient presents with   Knee Pain    Left    Fall    Multiple falls/ has appointment with physical therapy     Complains of left knee pain multiple falls  Knee exam is benign motion is full there is no effusion the knee is stable  Wrist range of motion normal still complains of some pain after injuring that  Her right knee has arthritis she is in a brace no intervals changes there.  X-ray left knee  DG Knee AP/LAT W/Sunrise Left Result Date: 07/06/2023 Complaints of left knee pain Imaging left knee no fracture no dislocation no joint space narrowing Mild to moderate patellar tilt without subluxation Impression patellar tilt left knee     Recommend  ASSESSMENT AND PLAN Recommend she stay on the plan for physical therapy to address the balance issues  She also needs to exercise only on the cardio elliptical trainer

## 2023-07-07 ENCOUNTER — Telehealth (HOSPITAL_COMMUNITY): Payer: PPO | Admitting: Psychiatry

## 2023-07-07 ENCOUNTER — Ambulatory Visit (HOSPITAL_COMMUNITY): Payer: PPO | Attending: Orthopedic Surgery

## 2023-07-07 ENCOUNTER — Encounter (HOSPITAL_COMMUNITY): Payer: Self-pay | Admitting: Psychiatry

## 2023-07-07 ENCOUNTER — Other Ambulatory Visit: Payer: Self-pay

## 2023-07-07 DIAGNOSIS — M6281 Muscle weakness (generalized): Secondary | ICD-10-CM | POA: Insufficient documentation

## 2023-07-07 DIAGNOSIS — R262 Difficulty in walking, not elsewhere classified: Secondary | ICD-10-CM | POA: Insufficient documentation

## 2023-07-07 DIAGNOSIS — F332 Major depressive disorder, recurrent severe without psychotic features: Secondary | ICD-10-CM | POA: Diagnosis not present

## 2023-07-07 DIAGNOSIS — M25561 Pain in right knee: Secondary | ICD-10-CM | POA: Insufficient documentation

## 2023-07-07 DIAGNOSIS — R269 Unspecified abnormalities of gait and mobility: Secondary | ICD-10-CM | POA: Insufficient documentation

## 2023-07-07 DIAGNOSIS — G8929 Other chronic pain: Secondary | ICD-10-CM | POA: Diagnosis not present

## 2023-07-07 DIAGNOSIS — R413 Other amnesia: Secondary | ICD-10-CM

## 2023-07-07 DIAGNOSIS — M1711 Unilateral primary osteoarthritis, right knee: Secondary | ICD-10-CM | POA: Insufficient documentation

## 2023-07-07 MED ORDER — ALPRAZOLAM 1 MG PO TABS: 1.0000 mg | ORAL_TABLET | Freq: Three times a day (TID) | ORAL | 2 refills | Status: DC

## 2023-07-07 MED ORDER — TRAZODONE HCL 100 MG PO TABS: ORAL_TABLET | ORAL | 2 refills | Status: DC

## 2023-07-07 MED ORDER — VENLAFAXINE HCL ER 150 MG PO CP24: 150.0000 mg | ORAL_CAPSULE | Freq: Every day | ORAL | 2 refills | Status: DC

## 2023-07-07 NOTE — Therapy (Addendum)
 OUTPATIENT PHYSICAL THERAPY LOWER EXTREMITY EVALUATION   Patient Name: [name] MRN: [id number] DOB:27-Jan-1961, 63 y.o., female Today's Date: 07/07/2023  END OF SESSION:   PT End of Session - 07/07/23 1651     Visit Number 1    Number of Visits 8    Date for PT Re-Evaluation 08/04/23    Authorization Type [employer] (no auth, no limit)    PT Start Time 1430    PT Stop Time 1520    PT Time Calculation (min) 50 min    Activity Tolerance Patient tolerated treatment well    Behavior During Therapy WFL for tasks assessed/performed             Past Medical History:  Diagnosis Date   Allergy    grass, dust , mold   Anxiety    Arthritis    Asthma due to seasonal allergies 06/15/2020   Bipolar disorder (HCC)    Carpal tunnel syndrome    Bilateral   Chest pain 09/2011   Cardiac cath-normal coronaries   Constipation    Depression    Difficulty urinating 05/31/2013   Elevated LFTs 12/16/2013   Encounter for general adult medical examination with abnormal findings 07/07/2021   GERD (gastroesophageal reflux disease)    History of kidney stones    Hyperlipemia    Hyperlipidemia    Hypertension    Mild; provoked by stress and anxiety   IBS (irritable bowel syndrome)    Intracerebral bleed (HCC)    No aneurysm; followed by Dr. Alix Brisker replaced    lense transplant 2022; pt states lense don't dilate or constrict   Loss of weight 01/06/2015   Osteoporosis    Stage 3a chronic kidney disease (HCC) 03/16/2021   Stroke (HCC) 1999   hemorrhagic stroke; weakness of left side   Past Surgical History:  Procedure Laterality Date   BIOPSY  04/16/2021   Procedure: BIOPSY;  Surgeon: Eartha Flavors, Toribio, MD;  Location: [facility];  Service: Gastroenterology;;  small, bowel, esophageal(proximal and distal);   BRAIN SURGERY  1999   to remove blood clot after stroke    CARDIAC CATHETERIZATION  2016   CERVICAL FUSION     CHOLECYSTECTOMY N/A  10/14/2014   Procedure: LAPAROSCOPIC CHOLECYSTECTOMY WITH INTRAOPERATIVE CHOLANGIOGRAM;  Surgeon: Krystal Russell, MD;  Location: [facility] OR;  Service: General;  Laterality: N/A;   CHONDROPLASTY Right 07/13/2017   Procedure: CHONDROPLASTY of patella;  Surgeon: Margrette Taft BRAVO, MD;  Location: [facility];  Service: Orthopedics;  Laterality: Right;   ESOPHAGEAL DILATION N/A 04/16/2021   Procedure: ESOPHAGEAL DILATION;  Surgeon: Eartha Flavors Toribio, MD;  Location: [facility];  Service: Gastroenterology;  Laterality: N/A;   ESOPHAGOGASTRODUODENOSCOPY (EGD) WITH PROPOFOL  N/A 04/16/2021   Procedure: ESOPHAGOGASTRODUODENOSCOPY (EGD) WITH PROPOFOL ;  Surgeon: Eartha Flavors Toribio, MD;  Location: [facility];  Service: Gastroenterology;  Laterality: N/A;  1:35, pt knows to arrive at 9:45   EUS N/A 08/21/2015   Procedure: ESOPHAGEAL ENDOSCOPIC ULTRASOUND (EUS) RADIAL;  Surgeon: Belvie Just, MD;  Location: [facility];  Service: Endoscopy;  Laterality: N/A;   KNEE ARTHROSCOPY WITH MEDIAL MENISECTOMY Right 07/13/2017   Procedure: KNEE ARTHROSCOPY WITH PARTIAL MEDIAL MENISECTOMY;  Surgeon: Margrette Taft BRAVO, MD;  Location: [facility];  Service: Orthopedics;  Laterality: Right;   LEFT HEART CATHETERIZATION WITH CORONARY ANGIOGRAM N/A 09/23/2011   Procedure: LEFT HEART CATHETERIZATION WITH CORONARY ANGIOGRAM;  Surgeon: Aleene JINNY Passe, MD;  Location: [facility];  Service: Cardiovascular;  Laterality:  N/A;   LIPOMA EXCISION Left 11/18/2013   Procedure: EXCISION OF SOFT TISSUE MASS-LEFT THIGH;  Surgeon: Oneil DELENA Budge, MD;  Location: [facility];  Service: General;  Laterality: Left;   NASAL SEPTOPLASTY W/ TURBINOPLASTY Bilateral 08/30/2021   Procedure: NASAL SEPTOPLASTY WITH BILATERAL TURBINATE REDUCTION;  Surgeon: Karis Clunes, MD;  Location: [facility];  Service: ENT;  Laterality: Bilateral;   POLYPECTOMY  04/16/2021   Procedure: POLYPECTOMY;  Surgeon: Eartha Angelia Sieving, MD;  Location:  [facility];  Service: Gastroenterology;;  gastric   RECTOCELE REPAIR     x2   RECTOCELE REPAIR N/A 04/04/2017   Procedure: POSTERIOR REPAIR (RECTOCELE);  Surgeon: Edsel Norleen GAILS, MD;  Location: [facility];  Service: Gynecology;  Laterality: N/A;   TOTAL ABDOMINAL HYSTERECTOMY     Patient Active Problem List   Diagnosis Date Noted   Small intestinal bacterial overgrowth (SIBO) 12/08/2022   Chronic fatigue 11/09/2022   Family history of breast cancer 11/08/2022   Osteoporosis with current pathological fracture 08/29/2022   Intestinal methanogen overgrowth 04/28/2022   History of total abdominal hysterectomy 03/28/2022   Dystrophia unguium 02/01/2022   Lumbar compression fracture (HCC) 12/23/2021   Lumbar spondylosis 11/04/2021   Encounter for general adult medical examination with abnormal findings 07/07/2021   Gastric polyp 05/31/2021   NASH (nonalcoholic steatohepatitis) 04/01/2021   Dysphagia 04/01/2021   Stage 3a chronic kidney disease (HCC) 03/16/2021   Arthritis 01/31/2021   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   History of hemorrhagic stroke with residual hemiplegia (HCC) 06/15/2020   Urinary incontinence 06/15/2020   Asthma due to seasonal allergies 06/15/2020   Vaginal itching 03/16/2020   S/P right knee arthroscopy 07/13/17 07/20/2017   Derangement of posterior horn of medial meniscus of right knee    Chondromalacia, patella, right    Irritable bowel syndrome with constipation and diarrhea 05/11/2015   Gait instability 01/06/2015   DDD (degenerative disc disease), lumbar 02/18/2014   Postmenopausal atrophic vaginitis 11/01/2013   Lipoma of abdominal wall 08/14/2013   Back pain 05/29/2013   Paresthesia of foot 02/12/2013   Hypertension    Depression 11/17/2011   Insomnia 11/17/2011   Mixed hyperlipidemia 09/20/2011   Anxiety 09/20/2011    PCP: Tobie Downs, MD  REFERRING PROVIDER: Margrette Taft BRAVO, MD  REFERRING DIAG: M17.11 (ICD-10-CM) - Primary osteoarthritis of  right knee R26.9 (ICD-10-CM) - Gait disturbance  THERAPY DIAG:  Muscle weakness (generalized) - Plan: PT plan of care cert/re-cert  Chronic pain of right knee - Plan: PT plan of care cert/re-cert  Difficulty walking - Plan: PT plan of care cert/re-cert  Rationale for Evaluation and Treatment: Rehabilitation  ONSET DATE: A year ago  SUBJECTIVE:   SUBJECTIVE STATEMENT: EVAL: Arrives to the clinic on a R knee brace with c/o R knee pain (see below). Patient also reports that the legs feel weak. Patient is a poor historian and cannot remember much about her hx but per patient, her issues started 1 year ago when she fell. Patient was given shots on the knee which helped. Patient also had surgery on her R meniscus and had good results but patient states that arthritis could have made her issue worse. Patient also had a hx of lumbar fx and is about to see a spine specialist. Clem[place] yesterday (landed on her knees) and states that back pain was worse (current low back pain = 4/10). States that she fell first before seeing Dr. Margrette yesterday and informed Dr. Margrette about it. Patient was recently referred to  outpatient PT evaluation and management.  PERTINENT HISTORY: Hx of stroke, osteoporosis, R meniscus tear 1 year ago, hx of lumbar fx PAIN:  Are you having pain? Yes: NPRS scale: 4 Pain location: around the knee cap Pain description: aching Aggravating factors: prolonged standing < 30 minutes, kneel, stairs Relieving factors: heat, laying down  PRECAUTIONS: Other: per patient, MD states that she's not allowed to use the treadmill and the low bike and can only use the elliptical, needs to wear the brace all the time (including exercises) except when resting  RED FLAGS: Recent episode of fall which made the back pain worse    WEIGHT BEARING RESTRICTIONS: No  FALLS:  Has patient fallen in last 6 months? Yes. Number of falls 18 (latest was yesterday)  LIVING ENVIRONMENT: Lives with:  lives with their spouse Lives in: Mobile home Stairs: Yes: External: 2 steps; can reach both Has following equipment at home: Single point cane  OCCUPATION: on disability  PLOF: Independent and Needs assistance with transfers  PATIENT GOALS: to be able to get back to how she was last year  NEXT MD VISIT: as needed  OBJECTIVE:  Note: Objective measures were completed at Evaluation unless otherwise noted.  DIAGNOSTIC FINDINGS:  07/06/23 Imaging left knee no fracture no dislocation no joint space narrowing   Mild to moderate patellar tilt without subluxation   Impression patellar tilt left knee  PATIENT SURVEYS:  FOTO 31.28  COGNITION: Overall cognitive status: Impaired     SENSATION: Not tested   MUSCLE LENGTH: Hamstrings: mild to moderate restriction on B  POSTURE: rounded shoulders, forward head, decreased lumbar lordosis, and knees flexed  PALPATION: Grade 1 tenderness on the R patella  LOWER EXTREMITY ROM:  Active ROM Right eval Left eval  Hip flexion [facility]  Hip extension    Hip abduction    Hip adduction    Hip internal rotation    Hip external rotation    Knee flexion 115 (done in sitting) WFL  Knee extension 0 WFL  Ankle dorsiflexion [facility] WFL  Ankle plantarflexion [facility] WFL  Ankle inversion    Ankle eversion     (Blank rows = not tested)  LOWER EXTREMITY MMT:  MMT Right eval Left eval  Hip flexion 4- 4-  Hip extension    Hip abduction 4- 4-  Hip adduction    Hip internal rotation    Hip external rotation    Knee flexion 4- 4-  Knee extension 4- 4-  Ankle dorsiflexion 4+ 3+  Ankle plantarflexion 4+ 4+  Ankle inversion    Ankle eversion     (Blank rows = not tested)  FUNCTIONAL TESTS:  5 times sit to stand: to be determined 2 minute walk test: to be determined  GAIT: Distance walked: around 40 ft Assistive device utilized: None Level of assistance: Complete Independence Comments: stooped posture, knees flexed on midstance,  decreased hip ext on swing phase, foot drop on L  TREATMENT DATE:  07/07/23 Evaluation and patient education done    PATIENT EDUCATION:  Education details: Educated on the pathoanatomy of Knee OA. Educated on the goals and course of rehab. Educated on the importance of having her spine evaluated ASAP after a fall Person educated: Patient Education method: Explanation Education comprehension: needs further education  HOME EXERCISE PROGRAM: None provided to date  ASSESSMENT:  CLINICAL IMPRESSION: EVAL: Patient is a 63 y.o. female who was seen today for physical therapy evaluation and treatment for R knee OA and gait disturbance. Patient's condition is further defined by difficulty with walking due to pain, weakness, and decreased soft tissue extensibility. Skilled PT is required to address the impairments and functional limitations listed below. With patient having osteoporosis and worsening back pain since the episode of fall yesterday, patient was advised to go to urgent care for further evaluation and management of her spine. Patient gave fair verbal understanding and needs further education    OBJECTIVE IMPAIRMENTS: Abnormal gait, decreased balance, difficulty walking, decreased strength, impaired flexibility, postural dysfunction, and pain.   ACTIVITY LIMITATIONS: carrying, lifting, bending, sitting, standing, squatting, and stairs  PARTICIPATION LIMITATIONS: meal prep, cleaning, laundry, driving, shopping, community activity, and yard work  PERSONAL FACTORS: Fitness, Time since onset of injury/illness/exacerbation, and 3+ comorbidities: Hx of stroke, osteoporosis, R meniscus tear 1 year ago, hx of lumbar fx  are also affecting patient's functional outcome.   REHAB POTENTIAL: Fair    CLINICAL DECISION MAKING: Evolving/moderate complexity  EVALUATION  COMPLEXITY: Moderate   GOALS: Goals reviewed with patient? Yes  SHORT TERM GOALS: Target date: 07/21/23  Pt will demonstrate indep in HEP to facilitate carry-over of skilled services and improve functional outcomes Goal status: INITIAL  LONG TERM GOALS: Target date: 08/04/23  Pt will increase FOTO to at least 45 in order to demonstrate significant improvement in function related to ADLs and ambulation Baseline: 31.28 Goal status: INITIAL  2.  Pt will demonstrate increase in LE strength to 4/5 to facilitate ease and safety in ambulation Baseline: 3+/5 Goal status: INITIAL  3.  Pt will decrease 5TSTS by at least 3 seconds in order to demonstrate clinically significant improvement in LE strength  Baseline: to be determined Goal status: INITIAL  4.  Pt will increase LEFS by at least 9 points in order to demonstrate significant improvement in lower extremity function. Baseline: to be determined Goal status: INITIAL    PLAN:  PT FREQUENCY: 2x/week  PT DURATION: 4 weeks  PLANNED INTERVENTIONS: 97164- PT Re-evaluation, 97110-Therapeutic exercises, 97530- Therapeutic activity, [id number]- Neuromuscular re-education, 97535- Self Care, [id number]- Manual therapy, [id number]- Gait training, and Patient/Family education  PLAN FOR NEXT SESSION: If permissible, perform 5STS and . Begin LE strengthening, flexibility, and balance exercises   Gabriellia Rempel L. Aric Jost, PT, DPT, OCS Board-Certified Clinical Specialist in Orthopedic PT PT Compact Privilege # ([place]): [id number] T 07/07/2023, 5:12 PM  [employer] Auth Request  Referring diagnosis code (ICD 10)? M17.11, R26.9 Treatment diagnosis codes (ICD 10)? (if different than referring diagnosis) M62.81, M25.561, G89.29, R26.2 What was this (referring dx) caused by? []  Surgery [x]  Fall [x]  Ongoing issue []  Arthritis []  Other: ____________  Laterality: [x]  Rt []  Lt []  Both  Deficits: [x]  Pain []  Stiffness [x]  Weakness []  Edema [x]  Balance  Deficits []  Coordination [x]  Gait Disturbance []  ROM []  Other   Functional Tool Score: FOTO = 31.28  CPT codes: See Planned Interventions listed in the Plan section of the Evaluation.

## 2023-07-07 NOTE — Progress Notes (Signed)
 Virtual Visit via Telephone Note  I connected with [name] on 07/07/23 at 10:40 AM EST by telephone and verified that I am speaking with the correct person using two identifiers.  Location: Patient: home Provider: office   I discussed the limitations, risks, security and privacy concerns of performing an evaluation and management service by telephone and the availability of in person appointments. I also discussed with the patient that there may be a patient responsible charge related to this service. The patient expressed understanding and agreed to proceed.      I discussed the assessment and treatment plan with the patient. The patient was provided an opportunity to ask questions and all were answered. The patient agreed with the plan and demonstrated an understanding of the instructions.   The patient was advised to call back or seek an in-person evaluation if the symptoms worsen or if the condition fails to improve as anticipated.  I provided 20 minutes of non-face-to-face time during this encounter.   Barnie Gull, MD  [facility] MD/PA/NP OP Progress Note  07/07/2023 11:07 AM [name]  MRN:  [id number]  Chief Complaint:  Chief Complaint  Patient presents with   Anxiety   Depression   Follow-up   HPI: T his patient is a 63 year old married white female who lives with her husband in [place]. She has no children. 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   The patient was for follow-up after 3 months regarding her depression and anxiety as well as fatigue.  She is still having problems with muscle and joint pain as well as weakness.  She has had a couple of falls.  She is starting physical therapy today.  She denies significant depression but is just frustrated with the state of her body.  The Xanax  continues to help her anxiety and trazodone  is helping her sleep. Visit Diagnosis:    ICD-10-CM   1. Major depressive  disorder, recurrent, severe without psychotic features (HCC)  F33.2     2. Memory deficit  R41.3       Past Psychiatric History: Past outpatient treatment for depression  Past Medical History:  Past Medical History:  Diagnosis Date   Allergy    grass, dust , mold   Anxiety    Arthritis    Asthma due to seasonal allergies 06/15/2020   Bipolar disorder (HCC)    Carpal tunnel syndrome    Bilateral   Chest pain 09/2011   Cardiac cath-normal coronaries   Constipation    Depression    Difficulty urinating 05/31/2013   Elevated LFTs 12/16/2013   Encounter for general adult medical examination with abnormal findings 07/07/2021   GERD (gastroesophageal reflux disease)    History of kidney stones    Hyperlipemia    Hyperlipidemia    Hypertension    Mild; provoked by stress and anxiety   IBS (irritable bowel syndrome)    Intracerebral bleed (HCC)    No aneurysm; followed by Dr. Alix Brisker replaced    lense transplant 2022; pt states lense don't dilate or constrict   Loss of weight 01/06/2015   Osteoporosis    Stage 3a chronic kidney disease (HCC) 03/16/2021   Stroke (HCC) 1999   hemorrhagic stroke; weakness of left side    Past Surgical History:  Procedure Laterality Date   BIOPSY  04/16/2021   Procedure: BIOPSY;  Surgeon: Eartha Angelia Sieving, MD;  [facility];  Service: Gastroenterology;;  small, bowel, esophageal(proximal and distal);   BRAIN SURGERY  1999   to remove blood clot after stroke    CARDIAC CATHETERIZATION  2016   CERVICAL FUSION     CHOLECYSTECTOMY N/A 10/14/2014   Procedure: LAPAROSCOPIC CHOLECYSTECTOMY WITH INTRAOPERATIVE CHOLANGIOGRAM;  Surgeon: Krystal Russell, MD;  Location: [facility] OR;  Service: General;  Laterality: N/A;   CHONDROPLASTY Right 07/13/2017   Procedure: CHONDROPLASTY of patella;  Surgeon: Margrette Taft BRAVO, MD;  Location: [facility];  Service: Orthopedics;  Laterality: Right;   ESOPHAGEAL DILATION N/A 04/16/2021    Procedure: ESOPHAGEAL DILATION;  Surgeon: Eartha Angelia Sieving, MD;  [facility];  Service: Gastroenterology;  Laterality: N/A;   ESOPHAGOGASTRODUODENOSCOPY (EGD) WITH PROPOFOL  N/A 04/16/2021   Procedure: ESOPHAGOGASTRODUODENOSCOPY (EGD) WITH PROPOFOL ;  Surgeon: Eartha Angelia Sieving, MD;  [facility];  Service: Gastroenterology;  Laterality: N/A;  1:35, pt knows to arrive at 9:45   EUS N/A 08/21/2015   Procedure: ESOPHAGEAL ENDOSCOPIC ULTRASOUND (EUS) RADIAL;  Surgeon: Belvie Just, MD;  Location: [facility];  Service: Endoscopy;  Laterality: N/A;   KNEE ARTHROSCOPY WITH MEDIAL MENISECTOMY Right 07/13/2017   Procedure: KNEE ARTHROSCOPY WITH PARTIAL MEDIAL MENISECTOMY;  Surgeon: Margrette Taft BRAVO, MD;  Location: [facility];  Service: Orthopedics;  Laterality: Right;   LEFT HEART CATHETERIZATION WITH CORONARY ANGIOGRAM N/A 09/23/2011   Procedure: LEFT HEART CATHETERIZATION WITH CORONARY ANGIOGRAM;  Surgeon: Aleene JINNY Passe, MD;  Location: [facility] CATH LAB;  Service: Cardiovascular;  Laterality: N/A;   LIPOMA EXCISION Left 11/18/2013   Procedure: EXCISION OF SOFT TISSUE MASS-LEFT THIGH;  Surgeon: Oneil DELENA Budge, MD;  Location: [facility];  Service: General;  Laterality: Left;   NASAL SEPTOPLASTY W/ TURBINOPLASTY Bilateral 08/30/2021   Procedure: NASAL SEPTOPLASTY WITH BILATERAL TURBINATE REDUCTION;  Surgeon: Karis Clunes, MD;  Location: [facility];  Service: ENT;  Laterality: Bilateral;   POLYPECTOMY  04/16/2021   Procedure: POLYPECTOMY;  Surgeon: Eartha Angelia Sieving, MD;  [facility];  Service: Gastroenterology;;  gastric   RECTOCELE REPAIR     x2   RECTOCELE REPAIR N/A 04/04/2017   Procedure: POSTERIOR REPAIR (RECTOCELE);  Surgeon: Edsel Norleen GAILS, MD;  Location: [facility];  Service: Gynecology;  Laterality: N/A;   TOTAL ABDOMINAL HYSTERECTOMY      Family Psychiatric History: See below  Family History:  Family History  Problem Relation Age  of Onset   Cancer Mother        breast    Hypertension Mother    Hyperlipidemia Mother    Depression Mother    Anxiety disorder Mother    COPD Mother    Arthritis Mother        rheumatoid   Drug abuse Sister    Coronary artery disease Paternal Grandfather    Coronary artery disease Paternal Uncle    Depression Cousin    Drug abuse Cousin     Social History:  Social History   Socioeconomic History   Marital status: Married    Spouse name: Christopher   Number of children: 0   Years of education: HS   Highest education level: Not on file  Occupational History   Occupation: unemployed    Comment: pending disability  Tobacco Use   Smoking status: Former    Current packs/day: 0.00    Average packs/day: 1 pack/day for 19.0 years (19.0 ttl pk-yrs)    Types: Cigarettes    Start date: 09/02/1978    Quit date: 09/01/1997  Years since quitting: 25.8    Passive exposure: Past   Smokeless tobacco: Never   Tobacco comments:    Quit smoking 1999 , previous 20 pack years  Vaping Use   Vaping status: Never Used  Substance and Sexual Activity   Alcohol use: Yes    Comment: 1 drink every other week   Drug use: No   Sexual activity: Not Currently    Partners: Male    Birth control/protection: Surgical    Comment: hyst   Other Topics Concern   Not on file  Social History Narrative   Currently unable to work   Lives in [place]   Married   Patient drinks 1 cup of caffeine daily.   Patient is right handed.    Joined the [employer] to get more exercise   Social Drivers of Health   Financial Resource Strain: Low Risk  (04/19/2023)   Overall Financial Resource Strain (CARDIA)    Difficulty of Paying Living Expenses: Not hard at all  Food Insecurity: No Food Insecurity (04/19/2023)   Hunger Vital Sign    Worried About Running Out of Food in the Last Year: Never true    Ran Out of Food in the Last Year: Never true  Transportation Needs: No Transportation Needs (04/19/2023)   PRAPARE -  Administrator, Civil Service (Medical): No    Lack of Transportation (Non-Medical): No  Physical Activity: Sufficiently Active (04/19/2023)   Exercise Vital Sign    Days of Exercise per Week: 7 days    Minutes of Exercise per Session: 30 min  Stress: No Stress Concern Present (04/19/2023)   [employer] of Occupational Health - Occupational Stress Questionnaire    Feeling of Stress : Only a little  Social Connections: Moderately Isolated (04/19/2023)   Social Connection and Isolation Panel [NHANES]    Frequency of Communication with Friends and Family: Twice a week    Frequency of Social Gatherings with Friends and Family: Once a week    Attends Religious Services: Never    Database Administrator or Organizations: No    Attends Banker Meetings: Never    Marital Status: Married    Allergies:  Allergies  Allergen Reactions   Morphine And Codeine Hives   Promethazine Hcl Other (See Comments)    Causes patient to become Hyper    Metabolic Disorder Labs: Lab Results  Component Value Date   HGBA1C 5.7 (H) 02/10/2023   MPG 117 (H) 01/06/2015   MPG 117 (H) 02/17/2014   No results found for: PROLACTIN Lab Results  Component Value Date   CHOL 151 02/10/2023   TRIG 184 (H) 02/10/2023   HDL 63 02/10/2023   CHOLHDL 2.4 02/10/2023   VLDL 26 09/16/2015   LDLCALC 58 02/10/2023   LDLCALC 52 08/25/2022   Lab Results  Component Value Date   TSH 1.410 02/10/2023   TSH 1.720 08/25/2022    Therapeutic Level Labs: No results found for: LITHIUM No results found for: VALPROATE No results found for: CBMZ  Current Medications: Current Outpatient Medications  Medication Sig Dispense Refill   acetaminophen  (TYLENOL  8 HOUR) 650 MG CR tablet Take 650 mg by mouth every 8 (eight) hours.     acetaminophen  (TYLENOL ) 500 MG tablet Take 500 mg by mouth. As needed     ALPRAZolam  (XANAX ) 1 MG tablet Take 1 tablet (1 mg total) by mouth 3 (three) times  daily. 90 tablet 2   atorvastatin  (LIPITOR) 40 MG tablet TAKE  ONE TABLET BY MOUTH ONCE DAILY. 90 tablet 3   Blood Glucose Monitoring Suppl DEVI 1 each by Does not apply route in the morning, at noon, and at bedtime. May substitute to any manufacturer covered by patient's insurance. 1 each 0   Calcium  500-125 MG-UNIT TABS Take 1 tablet by mouth daily with supper.     Carboxymethylcellulose Sod PF 0.5 % SOLN Place 1 drop into both eyes daily as needed (dry eyes).     cetirizine (ZYRTEC) 10 MG tablet Take 10 mg by mouth daily.     cyclobenzaprine  (FLEXERIL ) 5 MG tablet TAKE 1 TABLET BY MOUTH THREE TIMES DAILY AS NEEDED FOR MUSCLE SPASMS. 90 tablet 1   diclofenac  Sodium (VOLTAREN ) 1 % GEL Apply 1 application topically daily as needed (pain).     estradiol  (ESTRACE ) 0.1 MG/GM vaginal cream Place 0.5 g vaginally 2 (two) times a week. Place 0.5g nightly for two weeks then twice a week after 30 g 11   ezetimibe  (ZETIA ) 10 MG tablet Take 1 tablet (10 mg total) by mouth daily. 90 tablet 3   famotidine (PEPCID) 20 MG tablet Take 20 mg by mouth 2 (two) times daily. As needed     ipratropium (ATROVENT ) 0.03 % nasal spray Place 2 sprays into both nostrils every 12 (twelve) hours. 30 mL 6   lidocaine -prilocaine  (EMLA ) cream Apply 1 Application topically at bedtime.     lisinopril  (ZESTRIL ) 20 MG tablet Take 1 tablet (20 mg total) by mouth daily. 90 tablet 1   mirabegron  ER (MYRBETRIQ ) 50 MG TB24 tablet Take 1 tablet (50 mg total) by mouth daily. 30 tablet 5   montelukast  (SINGULAIR ) 10 MG tablet Take 1 tablet (10 mg total) by mouth at bedtime. 30 tablet 5   Multiple Vitamin (MULITIVITAMIN WITH MINERALS) TABS Take 1 tablet by mouth daily with breakfast.     Nystatin  POWD by Does not apply route. As needed.     Olopatadine HCl 0.2 % SOLN Place 1 drop into both eyes in the morning and at bedtime.     Omega-3 Fatty Acids (FISH OIL ) 1000 MG CAPS Take 2,000 mg by mouth.     omeprazole  (PRILOSEC) 40 MG capsule Take 1  capsule (40 mg total) by mouth 2 (two) times daily. 60 capsule 3   OVER THE COUNTER MEDICATION Milk thistle once per day.  Mushrooms once daily.  Vit K 2 once per day.  Beet root gummies daily     pregabalin  (LYRICA ) 25 MG capsule Take 1 capsule (25 mg total) by mouth daily. 30 capsule 3   Probiotic Product (TRUBIOTICS PO) Take 1 capsule by mouth daily. Takes 25 cfu daily.     pyridOXINE (VITAMIN B-6) 100 MG tablet Take 100 mg by mouth daily.     rifaximin  (XIFAXAN ) 550 MG TABS tablet Take 1 tablet (550 mg total) by mouth 3 (three) times daily. 42 tablet 0   traMADol  (ULTRAM ) 50 MG tablet Take 1 tablet (50 mg total) by mouth every 12 (twelve) hours as needed. 30 tablet 0   traMADol -acetaminophen  (ULTRACET ) 37.5-325 MG tablet Take 1 tablet by mouth every 4 (four) hours as needed. 90 tablet 5   traZODone  (DESYREL ) 100 MG tablet TAKE (2) TABLETS BY MOUTH AT BEDTIME. 60 tablet 2   TYMLOS 3120 MCG/1.56ML SOPN One qhs     venlafaxine  XR (EFFEXOR  XR) 150 MG 24 hr capsule Take 1 capsule (150 mg total) by mouth daily with breakfast. 90 capsule 2   No current facility-administered medications  for this visit.     Musculoskeletal: Strength & Muscle Tone: na Gait & Station: na Patient leans: N/A  Psychiatric Specialty Exam: Review of Systems  Musculoskeletal:  Positive for arthralgias, back pain, gait problem and myalgias.  All other systems reviewed and are negative.   There were no vitals taken for this visit.There is no height or weight on file to calculate BMI.  General Appearance: NA  Eye Contact:  NA  Speech:  Clear and Coherent  Volume:  Normal  Mood:  Euthymic  Affect:  NA  Thought Process:  Goal Directed  Orientation:  Full (Time, Place, and Person)  Thought Content: WDL   Suicidal Thoughts:  No  Homicidal Thoughts:  No  Memory:  Immediate;   Good Recent;   Fair Remote;   NA  Judgement:  Good  Insight:  Fair  Psychomotor Activity:  Decreased  Concentration:   Concentration: Fair and Attention Span: Fair  Recall:  Fair  Fund of Knowledge: Good  Language: Good  Akathisia:  No  Handed:  Right  AIMS (if indicated): not done  Assets:  Communication Skills Desire for Improvement Resilience Social Support  ADL's:  Intact  Cognition: WNL  Sleep:  Good   Screenings: AUDIT    Flowsheet Row Clinical Support from 01/06/2021 in [facility]  Alcohol Use Disorder Identification Test Final Score (AUDIT) 4      GAD-7    Flowsheet Row Office Visit from 08/29/2022 in [facility]  Total GAD-7 Score 6      MDI    Flowsheet Row Office Visit from 01/22/2016 in [facility]  Total Score (max 50) 34      Mini-Mental    Flowsheet Row Office Visit from 02/13/2015 in [facility]  Total Score (max 30 points ) 26      PHQ2-9    Flowsheet Row Clinical Support from 04/19/2023 in [facility] Office Visit from 02/27/2023 in [facility] Office Visit from 11/21/2022 in [facility] Office Visit from 08/29/2022 in [facility] Office Visit from 05/09/2022 in [facility]  PHQ-2 Total Score 2 0 0 2 1  PHQ-9 Total Score 6 -- -- 5 --      SBQ-R    Flowsheet Row Office Visit from 01/22/2016 in [facility] [place] Total Score 14.1      Flowsheet Row ED from 02/12/2023 in [facility] Department at [facility] ED from 04/15/2022 in [facility] Emergency Department at [facility] Video Visit from 02/15/2022 in [facility] at   C-SSRS RISK CATEGORY No Risk No Risk No Risk        Assessment and Plan: This patient is a 63 year old female with a history of depression anxiety ADD chronic fatigue and chronic  pain.  She never did retry the Concerta  and seems to be doing okay without it.  She is doing well on her other medications.  She will continue Effexor  XR 150 mg daily for depression and anxiety, Xanax  1 mg up to 3 times daily for anxiety and trazodone  200 mg at bedtime for sleep.  She will return to see me in 3 months  Collaboration of Care: Collaboration of Care: Primary Care Provider AEB notes are shared with PCP on the epic system  Patient/Guardian was advised Release of Information must be obtained prior to any record release in order to collaborate their care with an outside provider. Patient/Guardian was advised if they have not already done so to contact the registration department to sign all necessary forms in order for us  to release information regarding their care.   Consent: Patient/Guardian gives verbal consent for treatment and assignment of benefits for services provided during this visit. Patient/Guardian expressed understanding and agreed to proceed.    Barnie Gull, MD 07/07/2023, 11:07 AM

## 2023-07-09 ENCOUNTER — Encounter (HOSPITAL_COMMUNITY): Payer: Self-pay

## 2023-07-13 ENCOUNTER — Ambulatory Visit: Payer: PPO | Admitting: Orthopedic Surgery

## 2023-07-13 ENCOUNTER — Ambulatory Visit: Payer: PPO | Admitting: Orthopaedic Surgery

## 2023-07-13 ENCOUNTER — Other Ambulatory Visit (INDEPENDENT_AMBULATORY_CARE_PROVIDER_SITE_OTHER): Payer: Self-pay

## 2023-07-13 DIAGNOSIS — M25552 Pain in left hip: Secondary | ICD-10-CM

## 2023-07-13 DIAGNOSIS — M545 Low back pain, unspecified: Secondary | ICD-10-CM

## 2023-07-13 NOTE — Progress Notes (Signed)
 Office Visit Note   Patient: [name]           Date of Birth: Aug 01, 1960           MRN: [id number] Visit Date: 07/13/2023              Requested by: [name], MD [address] [place],  [place] [address] PCP: [name], MD   Assessment & Plan: Visit Diagnoses:  1. Pain in left hip   2. Pain of lumbar spine     Plan: X-rays reviewed she asked about what brace will hold her up straight.  She goes to the gym uses a sitdown elliptical type machine.  She has problems with the treadmill catching her foot and this is related to her old CVA problem.  We encouraged her to do what she can do at the gym that does not bother.  Husband does regularly and they frequently attend together.  Discussed with her I do not think she needs an AFO use on the left.  She will continue with her treatment for osteoporosis which she is prescribed elsewhere.  Follow-Up Instructions: No follow-ups on file.   Orders:  Orders Placed This Encounter  Procedures   XR Lumbar Spine 2-3 Views   XR HIP UNILAT W OR W/O PELVIS 2-3 VIEWS LEFT   No orders of the defined types were placed in this encounter.     Procedures: No procedures performed   Clinical Data: No additional findings.   Subjective: Chief Complaint  Patient presents with   Left Wrist - Pain   Left Hip - Pain   Lower Back - Pain    HPI 63 year old female with history of old CVA also L2 compression fracture osteo process has had some falls she is was bending over and trying to pick something up the floor chair slid landed on her back.  She did some posterior left hip pain.  States sometimes she catches her toe on the left foot.  She has some cork inserts in her shoes but she thinks it may be making things worse not better some discomfort in her left wrist.  She has seen Dr. Margrette also Dr. Orpha for osteoporosis.  She is concerned about some brace with strain upper back she has stable L2 compression fracture.  History of  30-40 4+ phone calls she has poor memory and does better receiving information written since she tends to forget.  Review of Systems all systems update not noncontributory to HPI.   Objective: Vital Signs: There were no vitals taken for this visit.  Physical Exam Constitutional:      Appearance: [name] is well-developed.  HENT:     Head: Normocephalic.     Right Ear: External ear normal.     Left Ear: External ear normal. There is no impacted cerumen.  Eyes:     Pupils: Pupils are equal, round, and reactive to light.  Neck:     Thyroid : No thyromegaly.     Trachea: No tracheal deviation.  Cardiovascular:     Rate and Rhythm: Normal rate.  Pulmonary:     Effort: Pulmonary effort is normal.  Abdominal:     Palpations: Abdomen is soft.  Musculoskeletal:     Cervical back: No rigidity.  Skin:    General: Skin is warm and dry.  Neurological:     Mental Status: [name] is alert and oriented to person, place, and time.  Psychiatric:  Behavior: Behavior normal.     Ortho Exam patient actually has strong anterior tib EHL on the left when she walks she does not always pick her foot up some this is a coordination problem and she will catch her toe but is not from true foot drop.  No sciatic notch tenderness negative straight leg raising 90 degrees.  Specialty Comments:  No specialty comments available.  Imaging: XR HIP UNILAT W OR W/O PELVIS 2-3 VIEWS LEFT Result Date: 07/14/2023 AP pelvis AP left hip frog-leg left hip are obtained reviewed this shows that are negative for acute changes no degenerative changes in the left hip.  Sacroiliac joint is normal. Impression:  pelvis and left hip radiographs negative for acute or chronic changes.  XR Lumbar Spine 2-3 Views Result Date: 07/14/2023 AP lateral lumbar images are obtained and reviewed this shows stable L2 fracture approximately 30 to 40%. Impression: Stable L2 chronic compression.    PMFS History: Patient Active Problem  List   Diagnosis Date Noted   Small intestinal bacterial overgrowth (SIBO) 12/08/2022   Chronic fatigue 11/09/2022   Family history of breast cancer 11/08/2022   Osteoporosis with current pathological fracture 08/29/2022   Intestinal methanogen overgrowth 04/28/2022   History of total abdominal hysterectomy 03/28/2022   Dystrophia unguium 02/01/2022   Lumbar compression fracture (HCC) 12/23/2021   Lumbar spondylosis 11/04/2021   Encounter for general adult medical examination with abnormal findings 07/07/2021   Gastric polyp 05/31/2021   NASH (nonalcoholic steatohepatitis) 04/01/2021   Dysphagia 04/01/2021   Stage 3a chronic kidney disease (HCC) 03/16/2021   Arthritis 01/31/2021   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   History of hemorrhagic stroke with residual hemiplegia (HCC) 06/15/2020   Urinary incontinence 06/15/2020   Asthma due to seasonal allergies 06/15/2020   Vaginal itching 03/16/2020   S/P right knee arthroscopy 07/13/17 07/20/2017   Derangement of posterior horn of medial meniscus of right knee    Chondromalacia, patella, right    Irritable bowel syndrome with constipation and diarrhea 05/11/2015   Gait instability 01/06/2015   DDD (degenerative disc disease), lumbar 02/18/2014   Postmenopausal atrophic vaginitis 11/01/2013   Lipoma of abdominal wall 08/14/2013   Back pain 05/29/2013   Paresthesia of foot 02/12/2013   Hypertension    Depression 11/17/2011   Insomnia 11/17/2011   Mixed hyperlipidemia 09/20/2011   Anxiety 09/20/2011   Past Medical History:  Diagnosis Date   Allergy    grass, dust , mold   Anxiety    Arthritis    Asthma due to seasonal allergies 06/15/2020   Bipolar disorder (HCC)    Carpal tunnel syndrome    Bilateral   Chest pain 09/2011   Cardiac cath-normal coronaries   Constipation    Depression    Difficulty urinating 05/31/2013   Elevated LFTs 12/16/2013   Encounter for general adult medical examination with abnormal findings 07/07/2021    GERD (gastroesophageal reflux disease)    History of kidney stones    Hyperlipemia    Hyperlipidemia    Hypertension    Mild; provoked by stress and anxiety   IBS (irritable bowel syndrome)    Intracerebral bleed (HCC)    No aneurysm; followed by Dr. Alix Brisker replaced    lense transplant 2022; pt states lense don't dilate or constrict   Loss of weight 01/06/2015   Osteoporosis    Stage 3a chronic kidney disease (HCC) 03/16/2021   Stroke (HCC) 1999   hemorrhagic stroke; weakness of left side    Family  History  Problem Relation Age of Onset   Cancer Mother        breast    Hypertension Mother    Hyperlipidemia Mother    Depression Mother    Anxiety disorder Mother    COPD Mother    Arthritis Mother        rheumatoid   Drug abuse Sister    Coronary artery disease Paternal Grandfather    Coronary artery disease Paternal Uncle    Depression Cousin    Drug abuse Cousin     Past Surgical History:  Procedure Laterality Date   BIOPSY  04/16/2021   Procedure: BIOPSY;  Surgeon: Eartha Flavors, Toribio, MD;  Location: [facility];  Service: Gastroenterology;;  small, bowel, esophageal(proximal and distal);   BRAIN SURGERY  1999   to remove blood clot after stroke    CARDIAC CATHETERIZATION  2016   CERVICAL FUSION     CHOLECYSTECTOMY N/A 10/14/2014   Procedure: LAPAROSCOPIC CHOLECYSTECTOMY WITH INTRAOPERATIVE CHOLANGIOGRAM;  Surgeon: Krystal Russell, MD;  [facility];  Service: General;  Laterality: N/A;   CHONDROPLASTY Right 07/13/2017   Procedure: CHONDROPLASTY of patella;  Surgeon: Margrette Taft BRAVO, MD;  Location: [facility];  Service: Orthopedics;  Laterality: Right;   ESOPHAGEAL DILATION N/A 04/16/2021   Procedure: ESOPHAGEAL DILATION;  Surgeon: Eartha Flavors Toribio, MD;  Location: [facility];  Service: Gastroenterology;  Laterality: N/A;   ESOPHAGOGASTRODUODENOSCOPY (EGD) WITH PROPOFOL  N/A 04/16/2021   Procedure: ESOPHAGOGASTRODUODENOSCOPY (EGD)  WITH PROPOFOL ;  Surgeon: Eartha Flavors Toribio, MD;  Location: [facility];  Service: Gastroenterology;  Laterality: N/A;  1:35, pt knows to arrive at 9:45   EUS N/A 08/21/2015   Procedure: ESOPHAGEAL ENDOSCOPIC ULTRASOUND (EUS) RADIAL;  Surgeon: Belvie Just, MD;  Location: [facility];  Service: Endoscopy;  Laterality: N/A;   KNEE ARTHROSCOPY WITH MEDIAL MENISECTOMY Right 07/13/2017   Procedure: KNEE ARTHROSCOPY WITH PARTIAL MEDIAL MENISECTOMY;  Surgeon: Margrette Taft BRAVO, MD;  Location: [facility];  Service: Orthopedics;  Laterality: Right;   LEFT HEART CATHETERIZATION WITH CORONARY ANGIOGRAM N/A 09/23/2011   Procedure: LEFT HEART CATHETERIZATION WITH CORONARY ANGIOGRAM;  Surgeon: Aleene JINNY Passe, MD;  Location: [facility];  Service: Cardiovascular;  Laterality: N/A;   LIPOMA EXCISION Left 11/18/2013   Procedure: EXCISION OF SOFT TISSUE MASS-LEFT THIGH;  Surgeon: Oneil DELENA Budge, MD;  Location: [facility];  Service: General;  Laterality: Left;   NASAL SEPTOPLASTY W/ TURBINOPLASTY Bilateral 08/30/2021   Procedure: NASAL SEPTOPLASTY WITH BILATERAL TURBINATE REDUCTION;  Surgeon: Karis Clunes, MD;  Location: [facility];  Service: ENT;  Laterality: Bilateral;   POLYPECTOMY  04/16/2021   Procedure: POLYPECTOMY;  Surgeon: Eartha Flavors Toribio, MD;  [facility];  Service: Gastroenterology;;  gastric   RECTOCELE REPAIR     x2   RECTOCELE REPAIR N/A 04/04/2017   Procedure: POSTERIOR REPAIR (RECTOCELE);  Surgeon: Edsel Norleen GAILS, MD;  Location: [facility];  Service: Gynecology;  Laterality: N/A;   TOTAL ABDOMINAL HYSTERECTOMY     Social History   Occupational History   Occupation: unemployed    Comment: pending disability  Tobacco Use   Smoking status: Former    Current packs/day: 0.00    Average packs/day: 1 pack/day for 19.0 years (19.0 ttl pk-yrs)    Types: Cigarettes    Start date: 09/02/1978    Quit date: 09/01/1997    Years since quitting: 25.8    Passive exposure:  Past   Smokeless tobacco: Never   Tobacco comments:  Quit smoking 1999 , previous 20 pack years  Vaping Use   Vaping status: Never Used  Substance and Sexual Activity   Alcohol use: Yes    Comment: 1 drink every other week   Drug use: No   Sexual activity: Not Currently    Partners: Male    Birth control/protection: Surgical    Comment: hyst

## 2023-07-14 ENCOUNTER — Other Ambulatory Visit (HOSPITAL_COMMUNITY): Payer: PPO

## 2023-07-17 ENCOUNTER — Encounter: Payer: Self-pay | Admitting: Orthopedic Surgery

## 2023-07-18 ENCOUNTER — Encounter (INDEPENDENT_AMBULATORY_CARE_PROVIDER_SITE_OTHER): Payer: Self-pay | Admitting: Gastroenterology

## 2023-07-18 ENCOUNTER — Encounter: Payer: Self-pay | Admitting: Orthopedic Surgery

## 2023-07-21 ENCOUNTER — Ambulatory Visit (HOSPITAL_COMMUNITY)
Admission: RE | Admit: 2023-07-21 | Discharge: 2023-07-21 | Disposition: A | Discharge status: Home / Self Care | Payer: PPO | Attending: Gastroenterology | Admitting: Gastroenterology

## 2023-07-21 ENCOUNTER — Ambulatory Visit (HOSPITAL_COMMUNITY): Payer: PPO | Admitting: Anesthesiology

## 2023-07-21 ENCOUNTER — Encounter (HOSPITAL_COMMUNITY)
Admission: RE | Disposition: A | Discharge status: Home / Self Care | Payer: Self-pay | Source: Home / Self Care | Attending: Gastroenterology

## 2023-07-21 ENCOUNTER — Other Ambulatory Visit: Payer: Self-pay

## 2023-07-21 ENCOUNTER — Encounter (HOSPITAL_COMMUNITY): Payer: Self-pay | Admitting: Gastroenterology

## 2023-07-21 DIAGNOSIS — I10 Essential (primary) hypertension: Secondary | ICD-10-CM

## 2023-07-21 DIAGNOSIS — F319 Bipolar disorder, unspecified: Secondary | ICD-10-CM | POA: Diagnosis not present

## 2023-07-21 DIAGNOSIS — R1010 Upper abdominal pain, unspecified: Secondary | ICD-10-CM | POA: Diagnosis not present

## 2023-07-21 DIAGNOSIS — K317 Polyp of stomach and duodenum: Secondary | ICD-10-CM | POA: Insufficient documentation

## 2023-07-21 DIAGNOSIS — Z87891 Personal history of nicotine dependence: Secondary | ICD-10-CM

## 2023-07-21 DIAGNOSIS — Z8541 Personal history of malignant neoplasm of cervix uteri: Secondary | ICD-10-CM | POA: Insufficient documentation

## 2023-07-21 DIAGNOSIS — Z8673 Personal history of transient ischemic attack (TIA), and cerebral infarction without residual deficits: Secondary | ICD-10-CM | POA: Diagnosis not present

## 2023-07-21 DIAGNOSIS — N1831 Chronic kidney disease, stage 3a: Secondary | ICD-10-CM | POA: Diagnosis not present

## 2023-07-21 DIAGNOSIS — E785 Hyperlipidemia, unspecified: Secondary | ICD-10-CM | POA: Diagnosis not present

## 2023-07-21 DIAGNOSIS — K3189 Other diseases of stomach and duodenum: Secondary | ICD-10-CM | POA: Diagnosis not present

## 2023-07-21 DIAGNOSIS — D131 Benign neoplasm of stomach: Secondary | ICD-10-CM | POA: Diagnosis not present

## 2023-07-21 DIAGNOSIS — R1013 Epigastric pain: Secondary | ICD-10-CM | POA: Diagnosis not present

## 2023-07-21 DIAGNOSIS — I129 Hypertensive chronic kidney disease with stage 1 through stage 4 chronic kidney disease, or unspecified chronic kidney disease: Secondary | ICD-10-CM | POA: Diagnosis not present

## 2023-07-21 DIAGNOSIS — K7581 Nonalcoholic steatohepatitis (NASH): Secondary | ICD-10-CM | POA: Diagnosis not present

## 2023-07-21 DIAGNOSIS — K219 Gastro-esophageal reflux disease without esophagitis: Secondary | ICD-10-CM | POA: Insufficient documentation

## 2023-07-21 DIAGNOSIS — Z79899 Other long term (current) drug therapy: Secondary | ICD-10-CM | POA: Diagnosis not present

## 2023-07-21 DIAGNOSIS — F419 Anxiety disorder, unspecified: Secondary | ICD-10-CM | POA: Diagnosis not present

## 2023-07-21 DIAGNOSIS — K580 Irritable bowel syndrome with diarrhea: Secondary | ICD-10-CM | POA: Diagnosis not present

## 2023-07-21 DIAGNOSIS — J45909 Unspecified asthma, uncomplicated: Secondary | ICD-10-CM | POA: Insufficient documentation

## 2023-07-21 DIAGNOSIS — Z8619 Personal history of other infectious and parasitic diseases: Secondary | ICD-10-CM | POA: Diagnosis not present

## 2023-07-21 HISTORY — PX: BIOPSY: SHX5522

## 2023-07-21 HISTORY — PX: SUBMUCOSAL LIFTING INJECTION: SHX6855

## 2023-07-21 HISTORY — PX: ESOPHAGOGASTRODUODENOSCOPY (EGD) WITH PROPOFOL: SHX5813

## 2023-07-21 HISTORY — PX: POLYPECTOMY: SHX5525

## 2023-07-21 SURGERY — ESOPHAGOGASTRODUODENOSCOPY (EGD) WITH PROPOFOL
Anesthesia: General

## 2023-07-21 MED ORDER — LACTATED RINGERS IV SOLN: INTRAVENOUS | Status: DC | PRN

## 2023-07-21 MED ORDER — LIDOCAINE HCL (CARDIAC) PF 100 MG/5ML IV SOSY
PREFILLED_SYRINGE | INTRAVENOUS | Status: DC | PRN
  Administered 2023-07-21: 80 mg via INTRAVENOUS

## 2023-07-21 MED ORDER — OMEPRAZOLE 40 MG PO CPDR: 40.0000 mg | DELAYED_RELEASE_CAPSULE | Freq: Two times a day (BID) | ORAL | 0 refills | Status: DC

## 2023-07-21 MED ORDER — PROPOFOL 10 MG/ML IV BOLUS
INTRAVENOUS | Status: DC | PRN
  Administered 2023-07-21 (×5): 50 mg via INTRAVENOUS

## 2023-07-21 NOTE — Anesthesia Preprocedure Evaluation (Addendum)
Anesthesia Evaluation  Patient identified by MRN, date of birth, ID band Patient awake    Reviewed: Allergy & Precautions, NPO status , Patient's Chart, lab work & pertinent test results  Airway Mallampati: II  TM Distance: <3 FB Neck ROM: Full   Comment: Cervical fusion Dental  (+) Dental Advisory Given, Missing A few teeth missing in back:   Pulmonary asthma , former smoker   Pulmonary exam normal breath sounds clear to auscultation       Cardiovascular Exercise Tolerance: Good hypertension, Pt. on medications Normal cardiovascular exam Rhythm:Regular Rate:Normal     Neuro/Psych  PSYCHIATRIC DISORDERS Anxiety Depression Bipolar Disorder    Neuromuscular disease CVA, Residual Symptoms    GI/Hepatic ,GERD  Medicated and Controlled,,(+) Hepatitis - (NASH)  Endo/Other  negative endocrine ROS    Renal/GU Renal InsufficiencyRenal disease  Female GU complaint (cervical cancer)     Musculoskeletal  (+) Arthritis ,    Abdominal Normal abdominal exam  (+)   Peds negative pediatric ROS (+)  Hematology negative hematology ROS (+)   Anesthesia Other Findings Cervical fusion  Reproductive/Obstetrics negative OB ROS                             Anesthesia Physical Anesthesia Plan  ASA: 3  Anesthesia Plan: General   Post-op Pain Management: Minimal or no pain anticipated   Induction: Intravenous  PONV Risk Score and Plan: TIVA  Airway Management Planned: Nasal Cannula and Natural Airway  Additional Equipment: None  Intra-op Plan:   Post-operative Plan:   Informed Consent: I have reviewed the patients History and Physical, chart, labs and discussed the procedure including the risks, benefits and alternatives for the proposed anesthesia with the patient or authorized representative who has indicated his/her understanding and acceptance.     Dental advisory given  Plan Discussed with:  CRNA  Anesthesia Plan Comments:         Anesthesia Quick Evaluation

## 2023-07-21 NOTE — Op Note (Addendum)
[facility] Patient Name: [name] Procedure Date: 07/21/2023 8:47 AM MRN: [id number] Date of Birth: 1961-03-28 Attending MD: Katrinka Blazing , , [id number] CSN: [id number] Age: 63 Admit Type: Outpatient Procedure:                Upper GI endoscopy Indications:              Upper abdominal pain, Follow-up of benign gastric                            tumor (fundic gland polyp with low grade dysplasia) Providers:                Katrinka Blazing, Crystal Page, Pandora Leiter,                            Technician Referring MD:              Medicines:                Monitored Anesthesia Care Complications:            No immediate complications. Estimated Blood Loss:     Estimated blood loss: none. Procedure:                Pre-Anesthesia Assessment:                           - Prior to the procedure, a History and Physical                            was performed, and patient medications, allergies                            and sensitivities were reviewed. The patient's                            tolerance of previous anesthesia was reviewed.                           - The risks and benefits of the procedure and the                            sedation options and risks were discussed with the                            patient. All questions were answered and informed                            consent was obtained.                           - ASA Grade Assessment: III - A patient with severe                            systemic disease.                           After obtaining informed consent, the  endoscope was                            passed under direct vision. Throughout the                            procedure, the patient's blood pressure, pulse, and                            oxygen saturations were monitored continuously. The                            [id number]-H190 ([id number]) scope was introduced through the                            mouth, and advanced to the second  part of duodenum.                            The upper GI endoscopy was accomplished without                            difficulty. The patient tolerated the procedure                            well. Scope In: 9:01:32 AM Scope Out: 9:26:55 AM Total Procedure Duration: 0 hours 25 minutes 23 seconds  Findings:      The esophagus was normal.      A single 12 to 14 mm semi-sessile polyp with no bleeding was found on       the anterior wall of the gastric body. Area was successfully injected       with 7.4 mL Eleview for a lift polypectomy but there was poor lifting.       Imaging was performed using white light and narrow band imaging to       visualize the mucosa and demarcate the polyp site after injection for       EMR purposes. The polyp was removed with a hot snare. Resection and       retrieval were complete.      A single 3 mm sessile polyp with no bleeding was found at the pylorus.       The polyp was removed with a hot snare. Resection and retrieval were       complete.      The rest of the examined stomach was normal. Biopsies from body and       incisura/antrum were taken with a cold forceps for Helicobacter pylori       testing.      The examined duodenum was normal. Impression:               - Normal esophagus.                           - A single gastric polyp. Resected and retrieved                            via EMR.                           -  A single gastric polyp. Resected and retrieved.                           - Normal stomach othwerwise. Biopsied.                           - Normal examined duodenum. Moderate Sedation:      Per Anesthesia Care Recommendation:           - Discharge patient to home (ambulatory).                           - Resume previous diet.                           - Await pathology results.                           - Repeat upper endoscopy for surveillance based on                            pathology results.                           -  Use Prilosec (omeprazole) 40 mg PO BID for 1                            month, then decrease to once a day/ Procedure Code(s):        --- Professional ---                           43251, 59, Esophagogastroduodenoscopy, flexible,                            transoral; with removal of tumor(s), polyp(s), or                            other lesion(s) by snare technique                           43239, 59, Esophagogastroduodenoscopy, flexible,                            transoral; with biopsy, single or multiple                           43236, 59, Esophagogastroduodenoscopy, flexible,                            transoral; with directed submucosal injection(s),                            any substance Diagnosis Code(s):        --- Professional ---                           K31.7, Polyp of stomach and duodenum  R10.10, Upper abdominal pain, unspecified                           D13.1, Benign neoplasm of stomach CPT copyright 2022 American Medical Association. All rights reserved. The codes documented in this report are preliminary and upon coder review may  be revised to meet current compliance requirements. Katrinka Blazing, MD Katrinka Blazing,  07/21/2023 9:38:07 AM This report has been signed electronically. Number of Addenda: 0

## 2023-07-21 NOTE — Transfer of Care (Signed)
Immediate Anesthesia Transfer of Care Note  Patient: [name]  Procedure(s) Performed: ESOPHAGOGASTRODUODENOSCOPY (EGD) WITH PROPOFOL SUBMUCOSAL LIFTING INJECTION POLYPECTOMY BIOPSY  Patient Location: Short Stay  Anesthesia Type:General  Level of Consciousness: awake, alert , oriented, and patient cooperative  Airway & Oxygen Therapy: Patient Spontanous Breathing  Post-op Assessment: Report given to RN, Post -op Vital signs reviewed and stable, and Patient moving all extremities X 4  Post vital signs: Reviewed and stable  Last Vitals:  Vitals Value Taken Time  BP 113/55 07/21/23 0932  Temp 36.3 C 07/21/23 0932  Pulse 76 07/21/23 0932  Resp 15 07/21/23 0932  SpO2 95 % 07/21/23 0932    Last Pain:  Vitals:   07/21/23 0932  TempSrc: Oral  PainSc: 4       Patients Stated Pain Goal: 5 (07/21/23 0823)  Complications: No notable events documented.

## 2023-07-21 NOTE — Discharge Instructions (Signed)
You are being discharged to home.  Resume your previous diet.  We are waiting for your pathology results.  Your physician has recommended a repeat upper endoscopy for surveillance based on pathology results.  Take Prilosec (omeprazole) 40 mg by mouth twice a day for one month.

## 2023-07-21 NOTE — H&P (Signed)
[name] is an 63 y.o. female.   Chief Complaint: abdominal pain and fundic gland polyp with low-grade dysplasia. HPI: [name] is a 63 y.o. female with past medical history of bipolar disorder, cervical cancer, depression, hyperlipidemia, hypertension, IBS, stroke, SIMO/SIBO, CKD stage III and NASH, who presents for follow up of abdominal pain and fundic gland polyp with low-grade dysplasia.  Reports recent episodes of pain in the epigastric area and right upper quadrant.  Also has presented recurrent bloating although she did not finish her course of Xifaxan as instructed.  The patient denies having any nausea, vomiting, fever, chills, hematochezia, melena, hematemesis,  jaundice, pruritus or weight loss.   Past Medical History:  Diagnosis Date   Allergy    grass, dust , mold   Anxiety    Arthritis    Asthma due to seasonal allergies 06/15/2020   Bipolar disorder (HCC)    Carpal tunnel syndrome    Bilateral   Chest pain 09/2011   Cardiac cath-normal coronaries   Constipation    Depression    Difficulty urinating 05/31/2013   Elevated LFTs 12/16/2013   Encounter for general adult medical examination with abnormal findings 07/07/2021   GERD (gastroesophageal reflux disease)    History of kidney stones    Hyperlipemia    Hyperlipidemia    Hypertension    Mild; provoked by stress and anxiety   IBS (irritable bowel syndrome)    Intracerebral bleed (HCC)    No aneurysm; followed by Dr. Athena Masse replaced    "lense transplant" 2022; pt states lense don't dilate or constrict   Loss of weight 01/06/2015   Osteoporosis    Stage 3a chronic kidney disease (HCC) 03/16/2021   Stroke (HCC) 1999   hemorrhagic stroke; weakness of left side    Past Surgical History:  Procedure Laterality Date   BIOPSY  04/16/2021   Procedure: BIOPSY;  Surgeon: Marguerita Merles, Reuel Boom, MD;  Location: [facility];  Service: Gastroenterology;;  small, bowel, esophageal(proximal  and distal);   BRAIN SURGERY  1999   to remove blood clot after stroke    CARDIAC CATHETERIZATION  2016   CERVICAL FUSION     CHOLECYSTECTOMY N/A 10/14/2014   Procedure: LAPAROSCOPIC CHOLECYSTECTOMY WITH INTRAOPERATIVE CHOLANGIOGRAM;  Surgeon: Avel Peace, MD;  Location: [facility];  Service: General;  Laterality: N/A;   CHONDROPLASTY Right 07/13/2017   Procedure: CHONDROPLASTY of patella;  Surgeon: Vickki Hearing, MD;  Location: [facility];  Service: Orthopedics;  Laterality: Right;   ESOPHAGEAL DILATION N/A 04/16/2021   Procedure: ESOPHAGEAL DILATION;  Surgeon: Dolores Frame, MD;  Location: [facility];  Service: Gastroenterology;  Laterality: N/A;   ESOPHAGOGASTRODUODENOSCOPY (EGD) WITH PROPOFOL N/A 04/16/2021   Procedure: ESOPHAGOGASTRODUODENOSCOPY (EGD) WITH PROPOFOL;  Surgeon: Dolores Frame, MD;  Location: [facility];  Service: Gastroenterology;  Laterality: N/A;  1:35, pt knows to arrive at 9:45   EUS N/A 08/21/2015   Procedure: ESOPHAGEAL ENDOSCOPIC ULTRASOUND (EUS) RADIAL;  Surgeon: [name], MD;  Location: [facility];  Service: Endoscopy;  Laterality: N/A;   KNEE ARTHROSCOPY WITH MEDIAL MENISECTOMY Right 07/13/2017   Procedure: KNEE ARTHROSCOPY WITH PARTIAL MEDIAL MENISECTOMY;  Surgeon: Vickki Hearing, MD;  Location: [facility];  Service: Orthopedics;  Laterality: Right;   LEFT HEART CATHETERIZATION WITH CORONARY ANGIOGRAM N/A 09/23/2011   Procedure: LEFT HEART CATHETERIZATION WITH CORONARY ANGIOGRAM;  Surgeon: Vesta Mixer, MD;  Location[facility];  Service: Cardiovascular;  Laterality: N/A;   LIPOMA EXCISION  Left 11/18/2013   Procedure: EXCISION OF SOFT TISSUE MASS-LEFT THIGH;  Surgeon: Dalia Heading, MD;  Location: [facility];  Service: General;  Laterality: Left;   NASAL SEPTOPLASTY W/ TURBINOPLASTY Bilateral 08/30/2021   Procedure: NASAL SEPTOPLASTY WITH BILATERAL TURBINATE REDUCTION;  Surgeon: Newman Pies, MD;  Location: [facility];  Service: ENT;  Laterality: Bilateral;   POLYPECTOMY  04/16/2021   Procedure: POLYPECTOMY;  Surgeon: Dolores Frame, MD;  Location: [facility];  Service: Gastroenterology;;  gastric   RECTOCELE REPAIR     x2   RECTOCELE REPAIR N/A 04/04/2017   Procedure: POSTERIOR REPAIR (RECTOCELE);  Surgeon: Tilda Burrow, MD;  Location: [facility];  Service: Gynecology;  Laterality: N/A;   TOTAL ABDOMINAL HYSTERECTOMY      Family History  Problem Relation Age of Onset   Cancer Mother        breast    Hypertension Mother    Hyperlipidemia Mother    Depression Mother    Anxiety disorder Mother    COPD Mother    Arthritis Mother        rheumatoid   Drug abuse Sister    Coronary artery disease Paternal Grandfather    Coronary artery disease Paternal Uncle    Depression Cousin    Drug abuse Cousin    Social History:  reports that [name] quit smoking about 25 years ago. [name]'s smoking use included cigarettes. [name] started smoking about 44 years ago. [name] has a 19 pack-year smoking history. [name] has been exposed to tobacco smoke. [name] has never used smokeless tobacco. [name] reports current alcohol use. [name] reports that [name] does not use drugs.  Allergies:  Allergies  Allergen Reactions   Morphine And Codeine Hives   Promethazine Hcl Other (See Comments)    Causes patient to become Hyper    Medications Prior to Admission  Medication Sig Dispense Refill   acetaminophen (TYLENOL) 500 MG tablet Take 500 mg by mouth. As needed     ALPRAZolam (XANAX) 1 MG tablet Take 1 tablet (1 mg total) by mouth 3 (three) times daily. 90 tablet 2   atorvastatin (LIPITOR) 40 MG tablet TAKE ONE TABLET BY MOUTH ONCE DAILY. 90 tablet 3   Calcium 500-125 MG-UNIT TABS Take 1 tablet by mouth daily with supper.     Carboxymethylcellulose Sod PF 0.5 % SOLN Place 1 drop into both eyes daily as needed (dry eyes).     cetirizine (ZYRTEC) 10 MG tablet Take 10 mg by mouth daily.     cyclobenzaprine  (FLEXERIL) 5 MG tablet TAKE 1 TABLET BY MOUTH THREE TIMES DAILY AS NEEDED FOR MUSCLE SPASMS. 90 tablet 1   ezetimibe (ZETIA) 10 MG tablet Take 1 tablet (10 mg total) by mouth daily. 90 tablet 3   famotidine (PEPCID) 20 MG tablet Take 20 mg by mouth 2 (two) times daily. As needed     ipratropium (ATROVENT) 0.03 % nasal spray Place 2 sprays into both nostrils every 12 (twelve) hours. 30 mL 6   lidocaine-prilocaine (EMLA) cream Apply 1 Application topically at bedtime.     lisinopril (ZESTRIL) 20 MG tablet Take 1 tablet (20 mg total) by mouth daily. 90 tablet 1   mirabegron ER (MYRBETRIQ) 50 MG TB24 tablet Take 1 tablet (50 mg total) by mouth daily. 30 tablet 5   montelukast (SINGULAIR) 10 MG tablet Take 1 tablet (10 mg total) by mouth at bedtime. 30 tablet 5   Multiple Vitamin (MULITIVITAMIN WITH MINERALS) TABS Take 1 tablet  by mouth daily with breakfast.     Olopatadine HCl 0.2 % SOLN Place 1 drop into both eyes in the morning and at bedtime.     Omega-3 Fatty Acids (FISH OIL) 1000 MG CAPS Take 2,000 mg by mouth.     omeprazole (PRILOSEC) 40 MG capsule Take 1 capsule (40 mg total) by mouth 2 (two) times daily. 60 capsule 3   OVER THE COUNTER MEDICATION Milk thistle once per day.  Mushrooms once daily.  Vit K 2 once per day.  Beet root gummies daily     pregabalin (LYRICA) 25 MG capsule Take 1 capsule (25 mg total) by mouth daily. 30 capsule 3   Probiotic Product (TRUBIOTICS PO) Take 1 capsule by mouth daily. Takes 25 cfu daily.     pyridOXINE (VITAMIN B-6) 100 MG tablet Take 100 mg by mouth daily.     rifaximin (XIFAXAN) 550 MG TABS tablet Take 1 tablet (550 mg total) by mouth 3 (three) times daily. 42 tablet 0   traMADol-acetaminophen (ULTRACET) 37.5-325 MG tablet Take 1 tablet by mouth every 4 (four) hours as needed. 90 tablet 5   traZODone (DESYREL) 100 MG tablet TAKE (2) TABLETS BY MOUTH AT BEDTIME. 60 tablet 2   TYMLOS 3120 MCG/1.56ML SOPN One qhs     venlafaxine XR (EFFEXOR XR) 150 MG  24 hr capsule Take 1 capsule (150 mg total) by mouth daily with breakfast. 90 capsule 2   acetaminophen (TYLENOL 8 HOUR) 650 MG CR tablet Take 650 mg by mouth every 8 (eight) hours.     Blood Glucose Monitoring Suppl DEVI 1 each by Does not apply route in the morning, at noon, and at bedtime. May substitute to any manufacturer covered by patient's insurance. 1 each 0   diclofenac Sodium (VOLTAREN) 1 % GEL Apply 1 application topically daily as needed (pain).     estradiol (ESTRACE) 0.1 MG/GM vaginal cream Place 0.5 g vaginally 2 (two) times a week. Place 0.5g nightly for two weeks then twice a week after 30 g 11   Nystatin POWD by Does not apply route. As needed.     traMADol (ULTRAM) 50 MG tablet Take 1 tablet (50 mg total) by mouth every 12 (twelve) hours as needed. 30 tablet 0    No results found. However, due to the size of the patient record, not all encounters were searched. Please check Results Review for a complete set of results. No results found.  Review of Systems  Gastrointestinal:  Positive for abdominal distention, abdominal pain and diarrhea.    Blood pressure 139/80, pulse 70, temperature 97.7 F (36.5 C), temperature source Oral, resp. rate 14, height 5\' 3"  (1.6 m), weight 65.8 kg, SpO2 96%. Physical Exam  GENERAL: The patient is AO x3, in no acute distress. HEENT: Head is normocephalic and atraumatic. EOMI are intact. Mouth is well hydrated and without lesions. NECK: Supple. No masses LUNGS: Clear to auscultation. No presence of rhonchi/wheezing/rales. Adequate chest expansion HEART: RRR, normal s1 and s2. ABDOMEN: Soft, nontender, no guarding, no peritoneal signs, and nondistended. BS +. No masses. EXTREMITIES: Without any cyanosis, clubbing, rash, lesions or edema. NEUROLOGIC: AOx3, no focal motor deficit. SKIN: no jaundice, no rashes  Assessment/Plan [name] is a 63 y.o. female with past medical history of bipolar disorder, cervical cancer, depression,  hyperlipidemia, hypertension, IBS, stroke, SIMO/SIBO, CKD stage III and NASH, who presents for follow up of abdominal pain and fundic gland polyp with low-grade dysplasia.  Will proceed with EGD.  Dolores Frame, MD 07/21/2023, 8:55 AM

## 2023-07-21 NOTE — Anesthesia Postprocedure Evaluation (Signed)
Anesthesia Post Note  Patient: [name]  Procedure(s) Performed: ESOPHAGOGASTRODUODENOSCOPY (EGD) WITH PROPOFOL SUBMUCOSAL LIFTING INJECTION POLYPECTOMY BIOPSY  Patient location during evaluation: PACU Anesthesia Type: General Level of consciousness: awake and alert Pain management: pain level controlled Vital Signs Assessment: post-procedure vital signs reviewed and stable Respiratory status: spontaneous breathing, nonlabored ventilation, respiratory function stable and patient connected to nasal cannula oxygen Cardiovascular status: blood pressure returned to baseline and stable Postop Assessment: no apparent nausea or vomiting Anesthetic complications: no   There were no known notable events for this encounter.   Last Vitals:  Vitals:   07/21/23 0823 07/21/23 0932  BP: 139/80 (!) 113/55  Pulse: 70 76  Resp: 14 15  Temp: 36.5 C (!) 36.3 C  SpO2: 96% 95%    Last Pain:  Vitals:   07/21/23 0932  TempSrc: Oral  PainSc: 4                  Eugina Row L Zalmen Wrightsman

## 2023-07-24 LAB — SURGICAL PATHOLOGY

## 2023-07-25 ENCOUNTER — Encounter (HOSPITAL_COMMUNITY): Payer: Self-pay | Admitting: Gastroenterology

## 2023-07-25 ENCOUNTER — Encounter: Payer: Self-pay | Admitting: Orthopedic Surgery

## 2023-07-27 ENCOUNTER — Encounter: Payer: Self-pay | Admitting: Orthopedic Surgery

## 2023-07-27 ENCOUNTER — Telehealth: Payer: Self-pay | Admitting: Radiology

## 2023-07-27 ENCOUNTER — Encounter (INDEPENDENT_AMBULATORY_CARE_PROVIDER_SITE_OTHER): Payer: Self-pay | Admitting: *Deleted

## 2023-07-27 NOTE — Telephone Encounter (Signed)
Dr Romeo Apple, patient has messaged the office several times, and we have not responded to her.  Several messages were not sent to you, they were closed/done.   Please read all the below pt messages and reach out to patient to advise either in MyChart message or call her.  As you know patient has trouble retaining and remembering what has been discussed with her.  She messaged Dr Ophelia Charter and his assistant as we have not responded, and they don't want to overstep - we need to advise her.  Please let me know how I can help as well.

## 2023-07-31 ENCOUNTER — Encounter: Payer: Self-pay | Admitting: Internal Medicine

## 2023-07-31 ENCOUNTER — Ambulatory Visit (INDEPENDENT_AMBULATORY_CARE_PROVIDER_SITE_OTHER): Payer: PPO | Admitting: Internal Medicine

## 2023-07-31 VITALS — BP 134/81 | HR 79 | Ht 63.0 in | Wt 150.4 lb

## 2023-07-31 DIAGNOSIS — I10 Essential (primary) hypertension: Secondary | ICD-10-CM | POA: Diagnosis not present

## 2023-07-31 DIAGNOSIS — N3941 Urge incontinence: Secondary | ICD-10-CM | POA: Diagnosis not present

## 2023-07-31 DIAGNOSIS — E782 Mixed hyperlipidemia: Secondary | ICD-10-CM | POA: Diagnosis not present

## 2023-07-31 DIAGNOSIS — K7581 Nonalcoholic steatohepatitis (NASH): Secondary | ICD-10-CM

## 2023-07-31 DIAGNOSIS — F419 Anxiety disorder, unspecified: Secondary | ICD-10-CM | POA: Diagnosis not present

## 2023-07-31 DIAGNOSIS — R7303 Prediabetes: Secondary | ICD-10-CM

## 2023-07-31 DIAGNOSIS — N1831 Chronic kidney disease, stage 3a: Secondary | ICD-10-CM | POA: Diagnosis not present

## 2023-07-31 DIAGNOSIS — S32020S Wedge compression fracture of second lumbar vertebra, sequela: Secondary | ICD-10-CM | POA: Diagnosis not present

## 2023-07-31 DIAGNOSIS — I69359 Hemiplegia and hemiparesis following cerebral infarction affecting unspecified side: Secondary | ICD-10-CM | POA: Diagnosis not present

## 2023-07-31 DIAGNOSIS — R5382 Chronic fatigue, unspecified: Secondary | ICD-10-CM

## 2023-07-31 DIAGNOSIS — M8000XS Age-related osteoporosis with current pathological fracture, unspecified site, sequela: Secondary | ICD-10-CM

## 2023-07-31 DIAGNOSIS — K582 Mixed irritable bowel syndrome: Secondary | ICD-10-CM | POA: Diagnosis not present

## 2023-07-31 DIAGNOSIS — S63502A Unspecified sprain of left wrist, initial encounter: Secondary | ICD-10-CM

## 2023-07-31 NOTE — Progress Notes (Signed)
Established Patient Office Visit  Subjective:  Patient ID: [name], female    DOB: 06/09/1961  Age: 63 y.o. MRN: [id number]  CC:  Chief Complaint  Patient presents with   Care Management    4 month f/u, had a fall last week , hurt her left hand , is wearing brace. Fall was on 07/26/2023   Hyperlipidemia   Hypertension    HPI [name] is a 63 y.o. female with past medical history of HTN, CKD, asthma due to seasonal allergies, h/o hemorrhagic stroke in 1999 (unclear etiology), HLD, anxiety with depression, urinary incontinence and GERD who presents for f/u of her chronic medical conditions.  HTN: Her BP has been wnl at home.  She has been taking lisinopril 20 mg daily again.  Her BP was well controlled today.  She denies any headache, chest pain or palpitations.  She had a fall on 07/29/23, likely mechanical due to leg weakness. She has left wrist pain, worse with extension.  She has wrist brace in place.  She is followed by Dr. Orthopedic surgery and going to schedule appt.  Lumbar compression fracture: She had a fall at nighttime at her home while using restroom in 10/23.  She sustained a L2 compression fracture, had a brace and was followed by neurosurgeon.  She has low back pain, for which she takes tramadol as needed.  Denies any pain upon deep breathing.  She has been continuing moderate activity as tolerated.    Peripheral neuropathy: She has been taking Lyrica for peripheral neuropathy with better response.  She had dizzy spells with gabapentin.  Osteoporosis: She is on Tymlos now.   CKD: Her last CMP showed GFR of 58.  Denies any dysuria, hematuria or urinary hesitance or resistance.  She takes oxybutynin for urinary incontinence.   NASH: Followed by GI.  She has RUQ area pain, which is more likely due to radiating pain from lumbar spine.  Her pain is intermittent, worse with movement and unrelated to food intake.  She has chronic bloating, and was found to have  intestinal methanogen overgrowth.  She was given Xifaxan for SIBO and/or IBS-D by GI, which she has completed with some improvement in her symptoms.  Chronic fatigue: She still reports chronic fatigue.  She currently takes Concerta for ADD, for followed by psychiatry.  Her TSH, vitamin D and B12 levels are WNL.  Denies any active bleeding.  She takes Effexor for MDD and Xanax for GAD.  Past Medical History:  Diagnosis Date   Allergy    grass, dust , mold   Anxiety    Arthritis    Asthma due to seasonal allergies 06/15/2020   Bipolar disorder (HCC)    Carpal tunnel syndrome    Bilateral   Chest pain 09/2011   Cardiac cath-normal coronaries   Constipation    Depression    Difficulty urinating 05/31/2013   Elevated LFTs 12/16/2013   Encounter for general adult medical examination with abnormal findings 07/07/2021   GERD (gastroesophageal reflux disease)    History of kidney stones    Hyperlipemia    Hyperlipidemia    Hypertension    Mild; provoked by stress and anxiety   IBS (irritable bowel syndrome)    Intracerebral bleed (HCC)    No aneurysm; followed by Dr. Athena Masse replaced    "lense transplant" 2022; pt states lense don't dilate or constrict   Loss of weight 01/06/2015   Osteoporosis    Stage 3a  chronic kidney disease (HCC) 03/16/2021   Stroke [facility]) 1999   hemorrhagic stroke; weakness of left side    Past Surgical History:  Procedure Laterality Date   BIOPSY  04/16/2021   Procedure: BIOPSY;  Surgeon: Dolores Frame, MD;  [facility]: [facility];  Service: Gastroenterology;;  small, bowel, esophageal(proximal and distal);   BIOPSY  07/21/2023   Procedure: BIOPSY;  Surgeon: Marguerita Merles, Reuel Boom, MD;  Location: [facility];  Service: Gastroenterology;;   BRAIN SURGERY  1999   to remove blood clot after stroke    CARDIAC CATHETERIZATION  2016   CERVICAL FUSION     CHOLECYSTECTOMY N/A 10/14/2014   Procedure: LAPAROSCOPIC CHOLECYSTECTOMY WITH  INTRAOPERATIVE CHOLANGIOGRAM;  Surgeon: Avel Peace, MD;  Location: [facility] OR;  Service: General;  Laterality: N/A;   CHONDROPLASTY Right 07/13/2017   Procedure: CHONDROPLASTY of patella;  Surgeon: Vickki Hearing, MD;  Location: [facility];  Service: Orthopedics;  Laterality: Right;   ESOPHAGEAL DILATION N/A 04/16/2021   Procedure: ESOPHAGEAL DILATION;  Surgeon: Dolores Frame, MD;  [facility]: [facility];  Service: Gastroenterology;  Laterality: N/A;   ESOPHAGOGASTRODUODENOSCOPY (EGD) WITH PROPOFOL N/A 04/16/2021   Procedure: ESOPHAGOGASTRODUODENOSCOPY (EGD) WITH PROPOFOL;  Surgeon: Dolores Frame, MD;  [facility]: [facility];  Service: Gastroenterology;  Laterality: N/A;  1:35, pt knows to arrive at 9:45   ESOPHAGOGASTRODUODENOSCOPY (EGD) WITH PROPOFOL N/A 07/21/2023   Procedure: ESOPHAGOGASTRODUODENOSCOPY (EGD) WITH PROPOFOL;  Surgeon: Dolores Frame, MD;  [facility];  Service: Gastroenterology;  Laterality: N/A;  9:15AM;ASA 1-2   EUS N/A 08/21/2015   Procedure: ESOPHAGEAL ENDOSCOPIC ULTRASOUND (EUS) RADIAL;  Surgeon: [name], MD;  Location: [facility];  Service: Endoscopy;  Laterality: N/A;   KNEE ARTHROSCOPY WITH MEDIAL MENISECTOMY Right 07/13/2017   Procedure: KNEE ARTHROSCOPY WITH PARTIAL MEDIAL MENISECTOMY;  Surgeon: Vickki Hearing, MD;  Location: [facility];  Service: Orthopedics;  Laterality: Right;   LEFT HEART CATHETERIZATION WITH CORONARY ANGIOGRAM N/A 09/23/2011   Procedure: LEFT HEART CATHETERIZATION WITH CORONARY ANGIOGRAM;  Surgeon: Vesta Mixer, MD;  Location: [facility] CATH LAB;  Service: Cardiovascular;  Laterality: N/A;   LIPOMA EXCISION Left 11/18/2013   Procedure: EXCISION OF SOFT TISSUE MASS-LEFT THIGH;  Surgeon: Dalia Heading, MD;  Location: [facility];  Service: General;  Laterality: Left;   NASAL SEPTOPLASTY W/ TURBINOPLASTY Bilateral 08/30/2021   Procedure: NASAL SEPTOPLASTY WITH BILATERAL TURBINATE REDUCTION;  Surgeon:  Newman Pies, MD;  Location: [facility];  Service: ENT;  Laterality: Bilateral;   POLYPECTOMY  04/16/2021   Procedure: POLYPECTOMY;  Surgeon: Dolores Frame, MD;  [facility];  Service: Gastroenterology;;  gastric   POLYPECTOMY  07/21/2023   Procedure: POLYPECTOMY;  Surgeon: Dolores Frame, MD;  [facility];  Service: Gastroenterology;;   RECTOCELE REPAIR     x2   RECTOCELE REPAIR N/A 04/04/2017   Procedure: POSTERIOR REPAIR (RECTOCELE);  Surgeon: Tilda Burrow, MD;  Location: [facility];  Service: Gynecology;  Laterality: N/A;   SUBMUCOSAL LIFTING INJECTION  07/21/2023   Procedure: SUBMUCOSAL LIFTING INJECTION;  Surgeon: Dolores Frame, MD;  [facility];  Service: Gastroenterology;;   TOTAL ABDOMINAL HYSTERECTOMY      Family History  Problem Relation Age of Onset   Cancer Mother        breast    Hypertension Mother    Hyperlipidemia Mother    Depression Mother    Anxiety disorder Mother    COPD Mother  Arthritis Mother        rheumatoid   Drug abuse Sister    Coronary artery disease Paternal Grandfather    Coronary artery disease Paternal Uncle    Depression Cousin    Drug abuse Cousin     Social History   Socioeconomic History   Marital status: Married    Spouse name: Dorene Sorrow   Number of children: 0   Years of education: HS   Highest education level: Not on file  Occupational History   Occupation: unemployed    Comment: pending disability  Tobacco Use   Smoking status: Former    Current packs/day: 0.00    Average packs/day: 1 pack/day for 19.0 years (19.0 ttl pk-yrs)    Types: Cigarettes    Start date: 09/02/1978    Quit date: 09/01/1997    Years since quitting: 25.9    Passive exposure: Past   Smokeless tobacco: Never   Tobacco comments:    Quit smoking 1999 , previous 20 pack years  Vaping Use   Vaping status: Never Used  Substance and Sexual Activity   Alcohol use: Yes    Comment:  1 drink every other week   Drug use: No   Sexual activity: Not Currently    Partners: Male    Birth control/protection: Surgical    Comment: hyst   Other Topics Concern   Not on file  Social History Narrative   Currently unable to work   Lives in [place]   Married   Patient drinks 1 cup of caffeine daily.   Patient is right handed.    Joined the [employer] to get more exercise   Social Drivers of Health   Financial Resource Strain: Low Risk  (04/19/2023)   Overall Financial Resource Strain (CARDIA)    Difficulty of Paying Living Expenses: Not hard at all  Food Insecurity: No Food Insecurity (04/19/2023)   Hunger Vital Sign    Worried About Running Out of Food in the Last Year: Never true    Ran Out of Food in the Last Year: Never true  Transportation Needs: No Transportation Needs (04/19/2023)   PRAPARE - Administrator, Civil Service (Medical): No    Lack of Transportation (Non-Medical): No  Physical Activity: Sufficiently Active (04/19/2023)   Exercise Vital Sign    Days of Exercise per Week: 7 days    Minutes of Exercise per Session: 30 min  Stress: No Stress Concern Present (04/19/2023)   [employer] of Occupational Health - Occupational Stress Questionnaire    Feeling of Stress : Only a little  Social Connections: Moderately Isolated (04/19/2023)   Social Connection and Isolation Panel [NHANES]    Frequency of Communication with Friends and Family: Twice a week    Frequency of Social Gatherings with Friends and Family: Once a week    Attends Religious Services: Never    Database administrator or Organizations: No    Attends Banker Meetings: Never    Marital Status: Married  Catering manager Violence: Not At Risk (04/19/2023)   Humiliation, Afraid, Rape, and Kick questionnaire    Fear of Current or Ex-Partner: No    Emotionally Abused: No    Physically Abused: No    Sexually Abused: No    Outpatient Medications Prior to Visit   Medication Sig Dispense Refill   acetaminophen (TYLENOL 8 HOUR) 650 MG CR tablet Take 650 mg by mouth every 8 (eight) hours.     ALPRAZolam (XANAX) 1 MG  tablet Take 1 tablet (1 mg total) by mouth 3 (three) times daily. 90 tablet 2   atorvastatin (LIPITOR) 40 MG tablet TAKE ONE TABLET BY MOUTH ONCE DAILY. 90 tablet 3   Blood Glucose Monitoring Suppl DEVI 1 each by Does not apply route in the morning, at noon, and at bedtime. May substitute to any manufacturer covered by patient's insurance. 1 each 0   Calcium 500-125 MG-UNIT TABS Take 1 tablet by mouth daily with supper.     Carboxymethylcellulose Sod PF 0.5 % SOLN Place 1 drop into both eyes daily as needed (dry eyes).     cetirizine (ZYRTEC) 10 MG tablet Take 10 mg by mouth daily.     cyclobenzaprine (FLEXERIL) 5 MG tablet TAKE 1 TABLET BY MOUTH THREE TIMES DAILY AS NEEDED FOR MUSCLE SPASMS. 90 tablet 1   diclofenac Sodium (VOLTAREN) 1 % GEL Apply 1 application topically daily as needed (pain).     ezetimibe (ZETIA) 10 MG tablet Take 1 tablet (10 mg total) by mouth daily. 90 tablet 3   famotidine (PEPCID) 20 MG tablet Take 20 mg by mouth 2 (two) times daily. As needed     ipratropium (ATROVENT) 0.03 % nasal spray Place 2 sprays into both nostrils every 12 (twelve) hours. 30 mL 6   lidocaine-prilocaine (EMLA) cream Apply 1 Application topically at bedtime.     lisinopril (ZESTRIL) 20 MG tablet Take 1 tablet (20 mg total) by mouth daily. 90 tablet 1   mirabegron ER (MYRBETRIQ) 50 MG TB24 tablet Take 1 tablet (50 mg total) by mouth daily. 30 tablet 5   montelukast (SINGULAIR) 10 MG tablet Take 1 tablet (10 mg total) by mouth at bedtime. 30 tablet 5   Multiple Vitamin (MULITIVITAMIN WITH MINERALS) TABS Take 1 tablet by mouth daily with breakfast.     Nystatin POWD by Does not apply route. As needed.     Omega-3 Fatty Acids (FISH OIL) 1000 MG CAPS Take 2,000 mg by mouth.     omeprazole (PRILOSEC) 40 MG capsule Take 1 capsule (40 mg total) by mouth  2 (two) times daily. 60 capsule 0   OVER THE COUNTER MEDICATION Milk thistle once per day.  Mushrooms once daily.  Vit K 2 once per day.  Beet root gummies daily     pregabalin (LYRICA) 25 MG capsule Take 1 capsule (25 mg total) by mouth daily. 30 capsule 3   Probiotic Product (TRUBIOTICS PO) Take 1 capsule by mouth daily. Takes 25 cfu daily.     pyridOXINE (VITAMIN B-6) 100 MG tablet Take 100 mg by mouth daily.     traMADol-acetaminophen (ULTRACET) 37.5-325 MG tablet Take 1 tablet by mouth every 4 (four) hours as needed. 90 tablet 5   traZODone (DESYREL) 100 MG tablet TAKE (2) TABLETS BY MOUTH AT BEDTIME. 60 tablet 2   TYMLOS 3120 MCG/1.56ML SOPN One qhs     venlafaxine XR (EFFEXOR XR) 150 MG 24 hr capsule Take 1 capsule (150 mg total) by mouth daily with breakfast. 90 capsule 2   acetaminophen (TYLENOL) 500 MG tablet Take 500 mg by mouth. As needed     rifaximin (XIFAXAN) 550 MG TABS tablet Take 1 tablet (550 mg total) by mouth 3 (three) times daily. 42 tablet 0   estradiol (ESTRACE) 0.1 MG/GM vaginal cream Place 0.5 g vaginally 2 (two) times a week. Place 0.5g nightly for two weeks then twice a week after (Patient not taking: Reported on 07/31/2023) 30 g 11   Olopatadine HCl 0.2 %  SOLN Place 1 drop into both eyes in the morning and at bedtime. (Patient not taking: Reported on 07/31/2023)     traMADol (ULTRAM) 50 MG tablet Take 1 tablet (50 mg total) by mouth every 12 (twelve) hours as needed. (Patient not taking: Reported on 07/31/2023) 30 tablet 0   No facility-administered medications prior to visit.    Allergies  Allergen Reactions   Morphine And Codeine Hives   Promethazine Hcl Other (See Comments)    Causes patient to become Hyper    ROS Review of Systems  Constitutional:  Positive for fatigue. Negative for chills and fever.  HENT:  Negative for congestion, sinus pressure and sinus pain.   Eyes:  Negative for pain and discharge.  Respiratory:  Negative for cough and shortness  of breath.   Cardiovascular:  Negative for chest pain and palpitations.  Gastrointestinal:  Positive for constipation. Negative for diarrhea, nausea and vomiting.  Endocrine: Negative for polydipsia and polyuria.  Genitourinary:  Negative for dysuria and hematuria.  Musculoskeletal:  Positive for arthralgias (Left wrist) and back pain. Negative for neck stiffness.  Skin:  Negative for rash.  Allergic/Immunologic: Positive for environmental allergies.  Neurological:  Negative for dizziness and weakness.  Psychiatric/Behavioral:  Positive for dysphoric mood. Negative for agitation and behavioral problems. The patient is nervous/anxious.       Objective:    Physical Exam Vitals reviewed.  Constitutional:      General: [name] is not in acute distress.    Appearance: [name] is not diaphoretic.  HENT:     Head: Normocephalic and atraumatic.     Nose: Nose normal. No congestion.     Mouth/Throat:     Mouth: Mucous membranes are dry.     Pharynx: No posterior oropharyngeal erythema.  Eyes:     General: No scleral icterus.    Extraocular Movements: Extraocular movements intact.  Cardiovascular:     Rate and Rhythm: Normal rate and regular rhythm.     Pulses: Normal pulses.     Heart sounds: Normal heart sounds. No murmur heard. Pulmonary:     Breath sounds: Normal breath sounds. No wheezing or rales.  Musculoskeletal:     Left wrist: Swelling and tenderness present. Decreased range of motion.     Cervical back: Neck supple. No tenderness.     Lumbar back: Tenderness present.  Skin:    General: Skin is warm.     Findings: No rash.  Neurological:     General: No focal deficit present.     Mental Status: [name] is alert and oriented to person, place, and time.     Comments: Facial droop, chronic  Psychiatric:        Mood and Affect: Mood normal.        Behavior: Behavior normal.     BP 134/81   Pulse 79   Ht 5\' 3"  (1.6 m)   Wt 150 lb 6.4 oz (68.2 kg)   SpO2 92%   BMI 26.64  kg/m  Wt Readings from Last 3 Encounters:  08/04/23 150 lb (68 kg)  07/31/23 150 lb 6.4 oz (68.2 kg)  07/21/23 145 lb (65.8 kg)    Lab Results  Component Value Date   TSH 1.410 02/10/2023   Lab Results  Component Value Date   WBC 6.5 07/31/2023   HGB 13.3 07/31/2023   HCT 39.3 07/31/2023   MCV 100 (H) 07/31/2023   PLT 251 07/31/2023   Lab Results  Component Value Date   NA 140  07/31/2023   K 4.6 07/31/2023   CO2 22 07/31/2023   GLUCOSE 102 (H) 07/31/2023   BUN 19 07/31/2023   CREATININE 1.10 (H) 07/31/2023   BILITOT 0.5 07/31/2023   ALKPHOS 72 07/31/2023   AST 29 07/31/2023   ALT 36 (H) 07/31/2023   PROT 6.7 07/31/2023   ALBUMIN 4.7 07/31/2023   CALCIUM 9.4 07/31/2023   ANIONGAP 8 08/25/2021   EGFR 58 (L) 07/31/2023   Lab Results  Component Value Date   CHOL 151 02/10/2023   Lab Results  Component Value Date   HDL 63 02/10/2023   Lab Results  Component Value Date   LDLCALC 58 02/10/2023   Lab Results  Component Value Date   TRIG 184 (H) 02/10/2023   Lab Results  Component Value Date   CHOLHDL 2.4 02/10/2023   Lab Results  Component Value Date   HGBA1C 5.5 07/31/2023      Assessment & Plan:   Problem List Items Addressed This Visit       Cardiovascular and Mediastinum   Hypertension - Primary   BP Readings from Last 1 Encounters:  07/31/23 134/81   Well-controlled with lisinopril 20 mg QD Counseled for compliance with the medications Advised DASH diet and moderate exercise/walking, at least 150 mins/week        Digestive   Irritable bowel syndrome with constipation and diarrhea   Takes Miralax for constipation Had diarrhea with Amitiza Completed Xifaxan for IBS and intestinal methanogenic overgrowth Followed by GI      NASH (nonalcoholic steatohepatitis)   Liver enzymes WNL Last [place] RUQ reviewed, showed hepatic steatosis Her BMI is ~ 25, already follows low carb diet Followed by GI        Musculoskeletal and Integument    Lumbar compression fracture (HCC)   Had a mechanical fall at home in 10/23, L2 compression fracture Had a lumbar back brace, followed by orthopedic surgery Pain improved, but has intermittent pain - RUQ area pain likely referred pain, which has improved with Lyrica      Osteoporosis with current pathological fracture   Had lumbar compression fracture Last DEXA scan reviewed On Tymlos      Sprain of left wrist   Left wrist pain and swelling due to recent fall - likely sprain of left wrist Advised to contact orthopedic surgeon She has Ultracet as needed for moderate to severe pain Continue to wear wrist brace for stabilization        Genitourinary   Stage 3a chronic kidney disease (HCC)   Last BMP showed GFR of 58, stable Needs to maintain adequate hydration Avoid nephrotoxic agents including NSAIDs, Dced Nabumetone and Diclofenac On Lisinopril      Relevant Orders   CBC with Differential/Platelet (Completed)   CMP14+EGFR (Completed)     Other   Anxiety (Chronic)   Follows up with Psychiatrist On Alprazolam 1 mg TID, Effexor 150 mg QD and Trazodone 200 mg qHS      Mixed hyperlipidemia   On Atorvastatin 40 mg QD due to h/o CVA and uncontrolled HLD      History of hemorrhagic stroke with residual hemiplegia (HCC)   In 1999, had residual left sided hemiplegia, no neurologic symptoms related to stroke currently On statin and Zetia currently      Urinary incontinence   Well controlled with Myrbetriq 50 mg QD now Tried oxybutynin - had worsening of dry mouth Used to follow-up with urogynecology      Chronic fatigue   Likely multifactorial -  her chronic low back pain from compression fracture has limited her physical activity MDD, GAD and ADD also contributing to her apathy, followed by psychiatry Currently does not have any B symptoms Up to date with colonoscopy Has CKD, but electrolytes are WNL Checked CBC, CMP, TSH, B12 level      Other Visit Diagnoses        Prediabetes       Relevant Orders   Hemoglobin A1c (Completed)       No orders of the defined types were placed in this encounter.   Follow-up: Return in about 5 months (around 12/29/2023) for HTN.    Anabel Halon, MD

## 2023-07-31 NOTE — Assessment & Plan Note (Signed)
BP Readings from Last 1 Encounters:  07/31/23 134/81   Well-controlled with lisinopril 20 mg QD Counseled for compliance with the medications Advised DASH diet and moderate exercise/walking, at least 150 mins/week

## 2023-07-31 NOTE — Patient Instructions (Signed)
Please continue to take medications as prescribed.  Please continue to follow low carb diet and perform moderate exercise/walking at least 150 mins/week.

## 2023-07-31 NOTE — Assessment & Plan Note (Signed)
Had lumbar compression fracture Last DEXA scan reviewed On Tymlos

## 2023-07-31 NOTE — Assessment & Plan Note (Signed)
On Atorvastatin 40 mg QD due to h/o CVA and uncontrolled HLD

## 2023-07-31 NOTE — Assessment & Plan Note (Addendum)
Liver enzymes WNL Last [place] RUQ reviewed, showed hepatic steatosis Her BMI is < 25, already follows low carb diet Followed by GI

## 2023-07-31 NOTE — Assessment & Plan Note (Addendum)
Last BMP showed GFR of 58, stable Needs to maintain adequate hydration Avoid nephrotoxic agents including NSAIDs, Dced Nabumetone and Diclofenac On Lisinopril

## 2023-07-31 NOTE — Assessment & Plan Note (Signed)
Had a mechanical fall at home in 10/23, L2 compression fracture Had a lumbar back brace, followed by orthopedic surgery Pain improved, but has intermittent pain - RUQ area pain likely referred pain, which has improved with Lyrica

## 2023-07-31 NOTE — Assessment & Plan Note (Addendum)
Takes Miralax for constipation Had diarrhea with Amitiza Completed Xifaxan for IBS and intestinal methanogenic overgrowth Followed by GI

## 2023-07-31 NOTE — Assessment & Plan Note (Signed)
Follows up with Psychiatrist On Alprazolam 1 mg TID, Effexor 150 mg QD and Trazodone 200 mg qHS

## 2023-08-01 ENCOUNTER — Encounter: Payer: Self-pay | Admitting: Internal Medicine

## 2023-08-01 LAB — CMP14+EGFR
ALT: 36 [IU]/L — ABNORMAL HIGH (ref 0–32)
AST: 29 [IU]/L (ref 0–40)
Albumin: 4.7 g/dL (ref 3.8–4.9)
Alkaline Phosphatase: 72 [IU]/L (ref 44–121)
BUN/Creatinine Ratio: 17 (ref 12–28)
BUN: 19 mg/dL (ref 8–27)
Bilirubin Total: 0.5 mg/dL (ref 0.0–1.2)
CO2: 22 mmol/L (ref 20–29)
Calcium: 9.4 mg/dL (ref 8.7–10.3)
Chloride: 100 mmol/L (ref 96–106)
Creatinine, Ser: 1.1 mg/dL — ABNORMAL HIGH (ref 0.57–1.00)
Globulin, Total: 2 g/dL (ref 1.5–4.5)
Glucose: 102 mg/dL — ABNORMAL HIGH (ref 70–99)
Potassium: 4.6 mmol/L (ref 3.5–5.2)
Sodium: 140 mmol/L (ref 134–144)
Total Protein: 6.7 g/dL (ref 6.0–8.5)
eGFR: 58 mL/min/{1.73_m2} — ABNORMAL LOW (ref >59)

## 2023-08-01 LAB — CBC WITH DIFFERENTIAL/PLATELET
Basophils Absolute: 0 10*3/uL (ref 0.0–0.2)
Basos: 1 %
EOS (ABSOLUTE): 0.2 10*3/uL (ref 0.0–0.4)
Eos: 3 %
Hematocrit: 39.3 % (ref 34.0–46.6)
Hemoglobin: 13.3 g/dL (ref 11.1–15.9)
Immature Grans (Abs): 0 10*3/uL (ref 0.0–0.1)
Immature Granulocytes: 0 %
Lymphocytes Absolute: 2.4 10*3/uL (ref 0.7–3.1)
Lymphs: 36 %
MCH: 33.8 pg — ABNORMAL HIGH (ref 26.6–33.0)
MCHC: 33.8 g/dL (ref 31.5–35.7)
MCV: 100 fL — ABNORMAL HIGH (ref 79–97)
Monocytes Absolute: 0.4 10*3/uL (ref 0.1–0.9)
Monocytes: 7 %
Neutrophils Absolute: 3.5 10*3/uL (ref 1.4–7.0)
Neutrophils: 53 %
Platelets: 251 10*3/uL (ref 150–450)
RBC: 3.94 x10E6/uL (ref 3.77–5.28)
RDW: 11.8 % (ref 11.7–15.4)
WBC: 6.5 10*3/uL (ref 3.4–10.8)

## 2023-08-01 LAB — HEMOGLOBIN A1C
Est. average glucose Bld gHb Est-mCnc: 111 mg/dL
Hgb A1c MFr Bld: 5.5 % (ref 4.8–5.6)

## 2023-08-02 NOTE — Progress Notes (Signed)
  Intake history:  Ht 5\' 3"  (1.6 m)   Wt 150 lb (68 kg)   BMI 26.57 kg/m  Body mass index is 26.57 kg/m.    WHAT ARE WE SEEING YOU FOR TODAY?   left wrist(s)  How long has this bothered you? (DOI?DOS?WS?)  on 07/27/23  fell   Anticoag.  No  Diabetes No  Heart disease No  Hypertension Yes  SMOKING HX No  Kidney disease Yes  Any ALLERGIES ______________________________________________   Treatment:  Have you taken:  Tylenol Yes  Advil No  Had PT No  Had injection No  Other  _________________________

## 2023-08-04 ENCOUNTER — Encounter: Payer: Self-pay | Admitting: Orthopedic Surgery

## 2023-08-04 ENCOUNTER — Ambulatory Visit: Payer: PPO | Admitting: Orthopedic Surgery

## 2023-08-04 ENCOUNTER — Other Ambulatory Visit (INDEPENDENT_AMBULATORY_CARE_PROVIDER_SITE_OTHER): Payer: Self-pay

## 2023-08-04 VITALS — BP 152/100 | HR 80 | Ht 63.0 in | Wt 150.0 lb

## 2023-08-04 DIAGNOSIS — M79632 Pain in left forearm: Secondary | ICD-10-CM | POA: Diagnosis not present

## 2023-08-04 DIAGNOSIS — G8929 Other chronic pain: Secondary | ICD-10-CM | POA: Diagnosis not present

## 2023-08-04 DIAGNOSIS — S63502A Unspecified sprain of left wrist, initial encounter: Secondary | ICD-10-CM | POA: Insufficient documentation

## 2023-08-04 DIAGNOSIS — M25562 Pain in left knee: Secondary | ICD-10-CM | POA: Diagnosis not present

## 2023-08-04 DIAGNOSIS — M25522 Pain in left elbow: Secondary | ICD-10-CM

## 2023-08-04 MED ORDER — METHYLPREDNISOLONE ACETATE 40 MG/ML IJ SUSP
40.0000 mg | Freq: Once | INTRAMUSCULAR | Status: AC
  Administered 2023-08-04: 40 mg via INTRA_ARTICULAR

## 2023-08-04 NOTE — Assessment & Plan Note (Signed)
Well controlled with Myrbetriq 50 mg QD now Tried oxybutynin - had worsening of dry mouth Used to follow-up with urogynecology

## 2023-08-04 NOTE — Addendum Note (Signed)
Addended by: Fuller Canada E on: 08/04/2023 11:01 AM   Modules accepted: Level of Service

## 2023-08-04 NOTE — Assessment & Plan Note (Signed)
In 1999, had residual left sided hemiplegia, no neurologic symptoms related to stroke currently ?On statin and Zetia currently ?

## 2023-08-04 NOTE — Progress Notes (Addendum)
Patient: [name]           Date of Birth: 05-Jul-1960           MRN: [id number] Visit Date: 08/04/2023 Requested by: Anabel Halon, MD [address] [place],  [place] [address] PCP: Anabel Halon, MD   Intake history:   Ht 5\' 3"  (1.6 m)   Wt 150 lb (68 kg)   BMI 26.57 kg/m  Body mass index is 26.57 kg/m.       WHAT ARE WE SEEING YOU FOR TODAY?    left wrist(s)   How long has this bothered you? (DOI?DOS?WS?)   on 07/27/23  fell    Anticoag.  No   Diabetes No   Heart disease No   Hypertension Yes   SMOKING HX No   Kidney disease Yes   Any ALLERGIES ______________________________________________     Treatment:  Have you taken:  Tylenol Yes  Advil No  Had PT No  Had injection No  Other  _________________________      Chief Complaint  Patient presents with   Wrist Injury    Fell on 07/27/23 left wrist pain    Encounter Diagnoses  Name Primary?   Left forearm pain    Sprain of left wrist, initial encounter    Pain in left elbow    Chronic pain of left knee Yes    Plan:  X-ray negative diagnosis sprain wrist  Splint for 4 to 6 weeks or until pain improves  Chief Complaint  Patient presents with   Wrist Injury    Larey Seat on 07/27/23 left wrist pain     63 year old female previous injury left wrist treated with bracing comes in today status post fall left wrist complains of wrist and forearm pain especially when putting pressure with the wrist in extension  Wrist Injury     Body mass index is 26.57 kg/m.   Problem list, medical hx, medications and allergies reviewed   ROS left knee pain chronic pain forgetfulness   Allergies  Allergen Reactions   Morphine And Codeine Hives   Promethazine Hcl Other (See Comments)    Causes patient to become Hyper    BP (!) 152/100   Pulse 80   Ht 5\' 3"  (1.6 m)   Wt 150 lb (68 kg)   BMI 26.57 kg/m    Physical exam: Physical Exam Vitals and nursing note reviewed.  Constitutional:       Appearance: Normal appearance.  HENT:     Head: Normocephalic and atraumatic.  Eyes:     General: No scleral icterus.       Right eye: No discharge.        Left eye: No discharge.     Extraocular Movements: Extraocular movements intact.     Conjunctiva/sclera: Conjunctivae normal.     Pupils: Pupils are equal, round, and reactive to light.  Cardiovascular:     Rate and Rhythm: Normal rate.     Pulses: Normal pulses.  Skin:    General: Skin is warm and dry.     Capillary Refill: Capillary refill takes less than 2 seconds.  Neurological:     General: No focal deficit present.     Mental Status: [name] is alert and oriented to person, place, and time.  Psychiatric:        Mood and Affect: Mood normal.        Behavior: Behavior normal.        Thought Content: Thought content  normal.        Judgment: Judgment normal.     Ortho Exam  MSK:  She is tender over the distal radius but not ulna hand is nontender she is tender over the left elbow and there is a area of ecchymosis on the lateral outcropping muscles  Data reviewed:   Image(s) reviewed with personal interpretation:   DG Forearm Left Result Date: 08/04/2023 Forearm x-ray Pain wrist forearm and elbow X-rays negative The suspect of bone at the distal ulna was noticed previously Impression normal left forearm elbow and wrist   XR HIP UNILAT W OR W/O PELVIS 2-3 VIEWS LEFT Result Date: 07/14/2023 AP pelvis AP left hip frog-leg left hip are obtained reviewed this shows that are negative for acute changes no degenerative changes in the left hip.  Sacroiliac joint is normal. Impression:  pelvis and left hip radiographs negative for acute or chronic changes.  XR Lumbar Spine 2-3 Views Result Date: 07/14/2023 AP lateral lumbar images are obtained and reviewed this shows stable L2 fracture approximately 30 to 40%. Impression: Stable L2 chronic compression.  DG Knee AP/LAT W/Sunrise Left Result Date: 07/06/2023 Complaints of left knee  pain Imaging left knee no fracture no dislocation no joint space narrowing Mild to moderate patellar tilt without subluxation Impression patellar tilt left knee     Assessment and plan:  Encounter Diagnoses  Name Primary?   Left forearm pain    Sprain of left wrist, initial encounter    Pain in left elbow    Chronic pain of left knee Yes    Sprain left wrist,  Chronic pain left knee and left knee injection   No orders of the defined types were placed in this encounter.   Procedures:   Procedure note left knee injection   verbal consent was obtained to inject left knee joint  Timeout was completed to confirm the site of injection  The medications used were depomedrol 40 mg and 1% lidocaine 3 cc Anesthesia was provided by ethyl chloride and the skin was prepped with alcohol.  After cleaning the skin with alcohol a 20-gauge needle was used to inject the left knee joint. There were no complications. A sterile bandage was applied.

## 2023-08-04 NOTE — Addendum Note (Signed)
Addended byCaffie Damme on: 08/04/2023 11:09 AM   Modules accepted: Orders

## 2023-08-04 NOTE — Assessment & Plan Note (Signed)
Left wrist pain and swelling due to recent fall - likely sprain of left wrist Advised to contact orthopedic surgeon She has Ultracet as needed for moderate to severe pain Continue to wear wrist brace for stabilization

## 2023-08-04 NOTE — Assessment & Plan Note (Signed)
Likely multifactorial - her chronic low back pain from compression fracture has limited her physical activity MDD, GAD and ADD also contributing to her apathy, followed by psychiatry Currently does not have any B symptoms Up to date with colonoscopy Has CKD, but electrolytes are WNL Checked CBC, CMP, TSH, B12 level

## 2023-08-08 ENCOUNTER — Ambulatory Visit (HOSPITAL_COMMUNITY): Payer: PPO | Attending: Orthopedic Surgery | Admitting: Physical Therapy

## 2023-08-08 ENCOUNTER — Other Ambulatory Visit: Payer: Self-pay | Admitting: Internal Medicine

## 2023-08-08 DIAGNOSIS — M6281 Muscle weakness (generalized): Secondary | ICD-10-CM | POA: Diagnosis not present

## 2023-08-08 DIAGNOSIS — R262 Difficulty in walking, not elsewhere classified: Secondary | ICD-10-CM | POA: Diagnosis not present

## 2023-08-08 DIAGNOSIS — G8929 Other chronic pain: Secondary | ICD-10-CM | POA: Insufficient documentation

## 2023-08-08 DIAGNOSIS — M25561 Pain in right knee: Secondary | ICD-10-CM | POA: Diagnosis not present

## 2023-08-08 NOTE — Therapy (Signed)
 OUTPATIENT PHYSICAL THERAPY LOWER EXTREMITY EVALUATION   Patient Name: [name] MRN: [id number] DOB:[birth year], 63 y.o., female Today's Date: 08/08/2023  END OF SESSION:   PT End of Session - 08/08/23 1058     Visit Number 2    Number of Visits 8    Date for PT Re-Evaluation 09/06/23    Authorization Type [employer] (no auth, no limit)    Progress Note Due on Visit 8    PT Start Time 1016    PT Stop Time 1058    PT Time Calculation (min) 42 min    Activity Tolerance Patient tolerated treatment well    Behavior During Therapy WFL for tasks assessed/performed              Past Medical History:  Diagnosis Date   Allergy    grass, dust , mold   Anxiety    Arthritis    Asthma due to seasonal allergies 06/15/2020   Bipolar disorder (HCC)    Carpal tunnel syndrome    Bilateral   Chest pain 09/2011   Cardiac cath-normal coronaries   Constipation    Depression    Difficulty urinating 05/31/2013   Elevated LFTs 12/16/2013   Encounter for general adult medical examination with abnormal findings 07/07/2021   GERD (gastroesophageal reflux disease)    History of kidney stones    Hyperlipemia    Hyperlipidemia    Hypertension    Mild; provoked by stress and anxiety   IBS (irritable bowel syndrome)    Intracerebral bleed (HCC)    No aneurysm; followed by Dr. Alix Brisker replaced    lense transplant 2022; pt states lense don't dilate or constrict   Loss of weight 01/06/2015   Osteoporosis    Stage 3a chronic kidney disease (HCC) 03/16/2021   Stroke (HCC) 1999   hemorrhagic stroke; weakness of left side   Past Surgical History:  Procedure Laterality Date   BIOPSY  04/16/2021   Procedure: BIOPSY;  Surgeon: Eartha Angelia Sieving, MD;  Location: [facility];  Service: Gastroenterology;;  small, bowel, esophageal(proximal and distal);   BIOPSY  07/21/2023   Procedure: BIOPSY;  Surgeon: Eartha Angelia, Sieving, MD;  Location: [facility];   Service: Gastroenterology;;   BRAIN SURGERY  1999   to remove blood clot after stroke    CARDIAC CATHETERIZATION  2016   CERVICAL FUSION     CHOLECYSTECTOMY N/A 10/14/2014   Procedure: LAPAROSCOPIC CHOLECYSTECTOMY WITH INTRAOPERATIVE CHOLANGIOGRAM;  Surgeon: Krystal Moriah Loughry, MD;  Location: [facility] OR;  Service: General;  Laterality: N/A;   CHONDROPLASTY Right 07/13/2017   Procedure: CHONDROPLASTY of patella;  Surgeon: Margrette Taft BRAVO, MD;  Location: [facility];  Service: Orthopedics;  Laterality: Right;   ESOPHAGEAL DILATION N/A 04/16/2021   Procedure: ESOPHAGEAL DILATION;  Surgeon: Eartha Angelia Sieving, MD;  [facility]: [facility];  Service: Gastroenterology;  Laterality: N/A;   ESOPHAGOGASTRODUODENOSCOPY (EGD) WITH PROPOFOL  N/A 04/16/2021   Procedure: ESOPHAGOGASTRODUODENOSCOPY (EGD) WITH PROPOFOL ;  Surgeon: Eartha Angelia Sieving, MD;  [facility]: [facility];  Service: Gastroenterology;  Laterality: N/A;  1:35, pt knows to arrive at 9:45   ESOPHAGOGASTRODUODENOSCOPY (EGD) WITH PROPOFOL  N/A 07/21/2023   Procedure: ESOPHAGOGASTRODUODENOSCOPY (EGD) WITH PROPOFOL ;  Surgeon: Eartha Angelia Sieving, MD;  [facility]: [facility];  Service: Gastroenterology;  Laterality: N/A;  9:15AM;ASA 1-2   EUS N/A 08/21/2015   Procedure: ESOPHAGEAL ENDOSCOPIC ULTRASOUND (EUS) RADIAL;  Surgeon: Belvie Just, MD;  Location: [facility];  Service: Endoscopy;  Laterality: N/A;  KNEE ARTHROSCOPY WITH MEDIAL MENISECTOMY Right 07/13/2017   Procedure: KNEE ARTHROSCOPY WITH PARTIAL MEDIAL MENISECTOMY;  Surgeon: Margrette Taft BRAVO, MD;  Location: [facility];  Service: Orthopedics;  Laterality: Right;   LEFT HEART CATHETERIZATION WITH CORONARY ANGIOGRAM N/A 09/23/2011   Procedure: LEFT HEART CATHETERIZATION WITH CORONARY ANGIOGRAM;  Surgeon: Aleene JINNY Passe, MD;  Location: [facility] CATH [facility];  Service: Cardiovascular;  Laterality: N/A;   LIPOMA EXCISION Left 11/18/2013   Procedure: EXCISION OF SOFT TISSUE MASS-LEFT THIGH;   Surgeon: Oneil DELENA Budge, MD;  Location: [facility];  Service: General;  Laterality: Left;   NASAL SEPTOPLASTY W/ TURBINOPLASTY Bilateral 08/30/2021   Procedure: NASAL SEPTOPLASTY WITH BILATERAL TURBINATE REDUCTION;  Surgeon: Karis Clunes, MD;  Location: [facility];  Service: ENT;  Laterality: Bilateral;   POLYPECTOMY  04/16/2021   Procedure: POLYPECTOMY;  Surgeon: Eartha Angelia Sieving, MD;  [facility];  Service: Gastroenterology;;  gastric   POLYPECTOMY  07/21/2023   Procedure: POLYPECTOMY;  Surgeon: Eartha Angelia Sieving, MD;  [facility]: [facility];  Service: Gastroenterology;;   RECTOCELE REPAIR     x2   RECTOCELE REPAIR N/A 04/04/2017   Procedure: POSTERIOR REPAIR (RECTOCELE);  Surgeon: Edsel Norleen GAILS, MD;  Location: [facility];  Service: Gynecology;  Laterality: N/A;   SUBMUCOSAL LIFTING INJECTION  07/21/2023   Procedure: SUBMUCOSAL LIFTING INJECTION;  Surgeon: Eartha Angelia, Sieving, MD;  Location: [facility];  Service: Gastroenterology;;   TOTAL ABDOMINAL HYSTERECTOMY     Patient Active Problem List   Diagnosis Date Noted   Sprain of left wrist 08/04/2023   Small intestinal bacterial overgrowth (SIBO) 12/08/2022   Chronic fatigue 11/09/2022   Family history of breast cancer 11/08/2022   Osteoporosis with current pathological fracture 08/29/2022   Intestinal methanogen overgrowth 04/28/2022   History of total abdominal hysterectomy 03/28/2022   Dystrophia unguium 02/01/2022   Lumbar compression fracture (HCC) 12/23/2021   Lumbar spondylosis 11/04/2021   Encounter for general adult medical examination with abnormal findings 07/07/2021   Gastric polyp 05/31/2021   NASH (nonalcoholic steatohepatitis) 04/01/2021   Dysphagia 04/01/2021   Stage 3a chronic kidney disease (HCC) 03/16/2021   Arthritis 01/31/2021   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   History of hemorrhagic stroke with residual hemiplegia (HCC) 06/15/2020   Urinary incontinence 06/15/2020    Asthma due to seasonal allergies 06/15/2020   Vaginal itching 03/16/2020   S/P right knee arthroscopy 07/13/17 07/20/2017   Derangement of posterior horn of medial meniscus of right knee    Chondromalacia, patella, right    Irritable bowel syndrome with constipation and diarrhea 05/11/2015   Gait instability 01/06/2015   DDD (degenerative disc disease), lumbar 02/18/2014   Postmenopausal atrophic vaginitis 11/01/2013   Lipoma of abdominal wall 08/14/2013   Paresthesia of foot 02/12/2013   Hypertension    Depression 11/17/2011   Insomnia 11/17/2011   Mixed hyperlipidemia 09/20/2011   Anxiety 09/20/2011    PCP: Tobie Downs, MD  REFERRING PROVIDER: Margrette Taft BRAVO, MD  REFERRING DIAG: M17.11 (ICD-10-CM) - Primary osteoarthritis of right knee R26.9 (ICD-10-CM) - Gait disturbance  THERAPY DIAG:  Muscle weakness (generalized) - Plan: PT plan of care cert/re-cert  Chronic pain of right knee - Plan: PT plan of care cert/re-cert  Difficulty walking - Plan: PT plan of care cert/re-cert  Rationale for Evaluation and Treatment: Rehabilitation  ONSET DATE: A year ago  SUBJECTIVE:   SUBJECTIVE STATEMENT: Pt states that she has fallen 3 times since her evaluation and had a colonoscopy so she has  not been to therapy since the evaluation.   PERTINENT HISTORY: Hx of stroke, osteoporosis, R meniscus tear 1 year ago, hx of lumbar fx PAIN:  Are you having pain? Yes: NPRS scale: 0 Pain location: around the knee cap Pain description: aching Aggravating factors: prolonged standing < 30 minutes, kneel, stairs Relieving factors: heat, laying down  PRECAUTIONS: Other: per patient, MD states that she's not allowed to use the treadmill and the low bike and can only use the elliptical, needs to wear the brace all the time (including exercises) except when resting  RED FLAGS: Recent episode of fall which made the back pain worse    WEIGHT BEARING RESTRICTIONS: No  FALLS:  Has patient  fallen in last 6 months? Yes. Number of falls 18 (latest was yesterday)  LIVING ENVIRONMENT: Lives with: lives with their spouse Lives in: Mobile home Stairs: Yes: External: 2 steps; can reach both Has following equipment at home: Single point cane  OCCUPATION: on disability  PLOF: Independent and Needs assistance with transfers  PATIENT GOALS: to be able to get back to how she was last year  NEXT MD VISIT: as needed  OBJECTIVE:  Note: Objective measures were completed at Evaluation unless otherwise noted.  DIAGNOSTIC FINDINGS:  07/06/23 Imaging left knee no fracture no dislocation no joint space narrowing   Mild to moderate patellar tilt without subluxation   Impression patellar tilt left knee  PATIENT SURVEYS:  FOTO 31.28  COGNITION: Overall cognitive status: Impaired     SENSATION: Not tested   MUSCLE LENGTH: Hamstrings: mild to moderate restriction on B  POSTURE: rounded shoulders, forward head, decreased lumbar lordosis, and knees flexed  PALPATION: Grade 1 tenderness on the R patella  LOWER EXTREMITY ROM:  Active ROM Right eval Left eval  Hip flexion [facility]  Hip extension    Hip abduction    Hip adduction    Hip internal rotation    Hip external rotation    Knee flexion 115 (done in sitting) WFL  Knee extension 0 WFL  Ankle dorsiflexion [facility]  Ankle plantarflexion [facility] WFL  Ankle inversion    Ankle eversion     (Blank rows = not tested)  LOWER EXTREMITY MMT:  MMT Right eval Left eval  Hip flexion 4- 4-  Hip extension    Hip abduction 4- 4-  Hip adduction    Hip internal rotation    Hip external rotation    Knee flexion 4- 4-  Knee extension 4- 4-  Ankle dorsiflexion 4+ 3+  Ankle plantarflexion 4+ 4+  Ankle inversion    Ankle eversion     (Blank rows = not tested)  FUNCTIONAL TESTS:  5 times sit to stand: 08/08/23 12.78 2 minute walk test: 08/08/23 345 ft  with decreased ankle motion   GAIT: Distance walked: around 40  ft Assistive device utilized: None Level of assistance: Complete Independence Comments: stooped posture, knees flexed on midstance, decreased hip ext on swing phase, foot drop on L  TREATMENT DATE:  08/08/23 sit to stand and 2 minute walk test  Heel raise x 10 Squat x 10  Sit to stand x 10  4 step up B x 10 Bridge x 10 Sitting Dorsiflexion x 10    Bike x 5 minutes with seat at 4 to increase ROM   07/07/23 Evaluation and patient education done    PATIENT EDUCATION:  Education details: Educated on the pathoanatomy of Knee OA. Educated on the goals and course of rehab. Educated on the importance of having her spine evaluated ASAP after a fall Person educated: Patient Education method: Explanation Education comprehension: needs further education  HOME EXERCISE PROGRAM: Access Code: [email or web address] URL: [email or web address] Date: 08/08/2023 Prepared by: Montie Metro  Exercises - Heel Raises with Counter Support  - 2 x daily - 7 x weekly - 1 sets - 10 reps - Mini Squat with Counter Support  - 2 x daily - 7 x weekly - 1 sets - 10 reps - 3-5 hold - Sit to Stand  - 2 x daily - 7 x weekly - 1 sets - 10 reps - Forward Step Up with Counter Support  - 2 x daily - 7 x weekly - 1 sets - 10 reps - Supine Bridge  - 2 x daily - 7 x weekly - 1 sets - 10 reps - 3-5 hold  ASSESSMENT:  CLINICAL IMPRESSION: Evaluation and goals reviewed with pt.  Pt has not been to back to therapy since evaluation due to falls and colonoscopy. Pt needs verbal cuing to perform exercises in proper position. PT continues to have decreased ROM, decreased strength and decreased balance and will benefit from skilled PT.    OBJECTIVE IMPAIRMENTS: Abnormal gait, decreased balance, difficulty walking, decreased strength, impaired flexibility, postural dysfunction, and pain.   ACTIVITY  LIMITATIONS: carrying, lifting, bending, sitting, standing, squatting, and stairs  PARTICIPATION LIMITATIONS: meal prep, cleaning, laundry, driving, shopping, community activity, and yard work  PERSONAL FACTORS: Fitness, Time since onset of injury/illness/exacerbation, and 3+ comorbidities: Hx of stroke, osteoporosis, R meniscus tear 1 year ago, hx of lumbar fx  are also affecting patient's functional outcome.   REHAB POTENTIAL: Fair    CLINICAL DECISION MAKING: Evolving/moderate complexity  EVALUATION COMPLEXITY: Moderate   GOALS: Goals reviewed with patient? Yes  SHORT TERM GOALS: Target date: 07/21/23  Pt will demonstrate indep in HEP to facilitate carry-over of skilled services and improve functional outcomes Goal status: on-going   LONG TERM GOALS: Target date: 08/04/23  Pt will increase FOTO to at least 45 in order to demonstrate significant improvement in function related to ADLs and ambulation Baseline: 31.28 Goal status: void not completing foto any longer  2.  Pt will demonstrate increase in LE strength to 4/5 to facilitate ease and safety in ambulation Baseline: 3+/5 Goal status: on-going 3.  Pt will decrease 5TSTS by at least 3 seconds in order to demonstrate clinically significant improvement in LE strength  Baseline: to be determined Goal status: on-going  4.  Pt will increase LEFS by at least 9 points in order to demonstrate significant improvement in lower extremity function. Baseline: to be determined Goal status: on-going    PLAN:  PT FREQUENCY: 2x/week  PT DURATION: 4 weeks  PLANNED INTERVENTIONS: [id number]- PT Re-evaluation, 97110-Therapeutic exercises, 97530- Therapeutic activity, 97112- Neuromuscular re-education, 97535- Self Care, [id number]- Manual therapy, [id number]- Gait training, and Patient/Family education  PLAN FOR NEXT SESSION: If permissible, perform 5STS and . Begin LE strengthening, flexibility, and balance exercises  Montie Metro,  PT  CLT [phone]

## 2023-08-11 ENCOUNTER — Encounter (HOSPITAL_COMMUNITY): Payer: PPO

## 2023-08-11 ENCOUNTER — Encounter (INDEPENDENT_AMBULATORY_CARE_PROVIDER_SITE_OTHER): Payer: Self-pay | Admitting: Gastroenterology

## 2023-08-16 ENCOUNTER — Ambulatory Visit (HOSPITAL_COMMUNITY): Payer: PPO | Admitting: Physical Therapy

## 2023-08-16 DIAGNOSIS — G8929 Other chronic pain: Secondary | ICD-10-CM

## 2023-08-16 DIAGNOSIS — M6281 Muscle weakness (generalized): Secondary | ICD-10-CM | POA: Diagnosis not present

## 2023-08-16 DIAGNOSIS — R262 Difficulty in walking, not elsewhere classified: Secondary | ICD-10-CM

## 2023-08-16 NOTE — Therapy (Signed)
OUTPATIENT PHYSICAL THERAPY LOWER EXTREMITY EVALUATION   Patient Name: [name] MRN: [id number] DOB:[birth year], 63 y.o., female Today's Date: 08/16/2023  END OF SESSION:   PT End of Session - 08/16/23 1650     Visit Number 3    Number of Visits 8    Date for PT Re-Evaluation 09/06/23    Authorization Type [employer] (no auth, no limit)    Progress Note Due on Visit 8    PT Start Time 1608    PT Stop Time 1650    PT Time Calculation (min) 42 min    Activity Tolerance Patient tolerated treatment well    Behavior During Therapy WFL for tasks assessed/performed               Past Medical History:  Diagnosis Date   Allergy    grass, dust , mold   Anxiety    Arthritis    Asthma due to seasonal allergies 06/15/2020   Bipolar disorder (HCC)    Carpal tunnel syndrome    Bilateral   Chest pain 09/2011   Cardiac cath-normal coronaries   Constipation    Depression    Difficulty urinating 05/31/2013   Elevated LFTs 12/16/2013   Encounter for general adult medical examination with abnormal findings 07/07/2021   GERD (gastroesophageal reflux disease)    History of kidney stones    Hyperlipemia    Hyperlipidemia    Hypertension    Mild; provoked by stress and anxiety   IBS (irritable bowel syndrome)    Intracerebral bleed (HCC)    No aneurysm; followed by Dr. Athena Masse replaced    "lense transplant" 2022; pt states lense don't dilate or constrict   Loss of weight 01/06/2015   Osteoporosis    Stage 3a chronic kidney disease (HCC) 03/16/2021   Stroke (HCC) 1999   hemorrhagic stroke; weakness of left side   Past Surgical History:  Procedure Laterality Date   BIOPSY  04/16/2021   Procedure: BIOPSY;  Surgeon: Dolores Frame, MD;  Location: [facility];  Service: Gastroenterology;;  small, bowel, esophageal(proximal and distal);   BIOPSY  07/21/2023   Procedure: BIOPSY;  Surgeon: Marguerita Merles, Reuel Boom, MD;  Location: [facility];  Service: Gastroenterology;;   BRAIN SURGERY  1999   to remove blood clot after stroke    CARDIAC CATHETERIZATION  2016   CERVICAL FUSION     CHOLECYSTECTOMY N/A 10/14/2014   Procedure: LAPAROSCOPIC CHOLECYSTECTOMY WITH INTRAOPERATIVE CHOLANGIOGRAM;  Surgeon: Avel Peace, MD;  Location[facility];  Service: General;  Laterality: N/A;   CHONDROPLASTY Right 07/13/2017   Procedure: CHONDROPLASTY of patella;  Surgeon: Vickki Hearing, MD;  Location: [facility];  Service: Orthopedics;  Laterality: Right;   ESOPHAGEAL DILATION N/A 04/16/2021   Procedure: ESOPHAGEAL DILATION;  Surgeon: Dolores Frame, MD;  [facility]: [facility];  Service: Gastroenterology;  Laterality: N/A;   ESOPHAGOGASTRODUODENOSCOPY (EGD) WITH PROPOFOL N/A 04/16/2021   Procedure: ESOPHAGOGASTRODUODENOSCOPY (EGD) WITH PROPOFOL;  Surgeon: Dolores Frame, MD;  [facility]: [facility];  Service: Gastroenterology;  Laterality: N/A;  1:35, pt knows to arrive at 9:45   ESOPHAGOGASTRODUODENOSCOPY (EGD) WITH PROPOFOL N/A 07/21/2023   Procedure: ESOPHAGOGASTRODUODENOSCOPY (EGD) WITH PROPOFOL;  Surgeon: Dolores Frame, MD;  [facility]: [facility];  Service: Gastroenterology;  Laterality: N/A;  9:15AM;ASA 1-2   EUS N/A 08/21/2015   Procedure: ESOPHAGEAL ENDOSCOPIC ULTRASOUND (EUS) RADIAL;  Surgeon: [name], MD;  Location: [facility];  Service: Endoscopy;  Laterality: N/A;  KNEE ARTHROSCOPY WITH MEDIAL MENISECTOMY Right 07/13/2017   Procedure: KNEE ARTHROSCOPY WITH PARTIAL MEDIAL MENISECTOMY;  Surgeon: Vickki Hearing, MD;  Location: [facility];  Service: Orthopedics;  Laterality: Right;   LEFT HEART CATHETERIZATION WITH CORONARY ANGIOGRAM N/A 09/23/2011   Procedure: LEFT HEART CATHETERIZATION WITH CORONARY ANGIOGRAM;  Surgeon: Vesta Mixer, MD;  Location: [facility];  Service: Cardiovascular;  Laterality: N/A;   LIPOMA EXCISION Left 11/18/2013   Procedure: EXCISION OF SOFT TISSUE MASS-LEFT  THIGH;  Surgeon: Dalia Heading, MD;  Location: [facility];  Service: General;  Laterality: Left;   NASAL SEPTOPLASTY W/ TURBINOPLASTY Bilateral 08/30/2021   Procedure: NASAL SEPTOPLASTY WITH BILATERAL TURBINATE REDUCTION;  Surgeon: Newman Pies, MD;  Location: [facility];  Service: ENT;  Laterality: Bilateral;   POLYPECTOMY  04/16/2021   Procedure: POLYPECTOMY;  Surgeon: Dolores Frame, MD;  [facility];  Service: Gastroenterology;;  gastric   POLYPECTOMY  07/21/2023   Procedure: POLYPECTOMY;  Surgeon: Dolores Frame, MD;  [facility]: [facility];  Service: Gastroenterology;;   RECTOCELE REPAIR     x2   RECTOCELE REPAIR N/A 04/04/2017   Procedure: POSTERIOR REPAIR (RECTOCELE);  Surgeon: Tilda Burrow, MD;  Location: [facility];  Service: Gynecology;  Laterality: N/A;   SUBMUCOSAL LIFTING INJECTION  07/21/2023   Procedure: SUBMUCOSAL LIFTING INJECTION;  Surgeon: Marguerita Merles, Reuel Boom, MD;  Location: [facility];  Service: Gastroenterology;;   TOTAL ABDOMINAL HYSTERECTOMY     Patient Active Problem List   Diagnosis Date Noted   Sprain of left wrist 08/04/2023   Small intestinal bacterial overgrowth (SIBO) 12/08/2022   Chronic fatigue 11/09/2022   Family history of breast cancer 11/08/2022   Osteoporosis with current pathological fracture 08/29/2022   Intestinal methanogen overgrowth 04/28/2022   History of total abdominal hysterectomy 03/28/2022   Dystrophia unguium 02/01/2022   Lumbar compression fracture (HCC) 12/23/2021   Lumbar spondylosis 11/04/2021   Encounter for general adult medical examination with abnormal findings 07/07/2021   Gastric polyp 05/31/2021   NASH (nonalcoholic steatohepatitis) 04/01/2021   Dysphagia 04/01/2021   Stage 3a chronic kidney disease (HCC) 03/16/2021   Arthritis 01/31/2021   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   History of hemorrhagic stroke with residual hemiplegia (HCC) 06/15/2020   Urinary incontinence  06/15/2020   Asthma due to seasonal allergies 06/15/2020   Vaginal itching 03/16/2020   S/P right knee arthroscopy 07/13/17 07/20/2017   Derangement of posterior horn of medial meniscus of right knee    Chondromalacia, patella, right    Irritable bowel syndrome with constipation and diarrhea 05/11/2015   Gait instability 01/06/2015   DDD (degenerative disc disease), lumbar 02/18/2014   Postmenopausal atrophic vaginitis 11/01/2013   Lipoma of abdominal wall 08/14/2013   Paresthesia of foot 02/12/2013   Hypertension    Depression 11/17/2011   Insomnia 11/17/2011   Mixed hyperlipidemia 09/20/2011   Anxiety 09/20/2011    PCP: Trena Platt, MD  REFERRING PROVIDER: Vickki Hearing, MD  REFERRING DIAG: M17.11 (ICD-10-CM) - Primary osteoarthritis of right knee R26.9 (ICD-10-CM) - Gait disturbance  THERAPY DIAG:  Muscle weakness (generalized)  Chronic pain of right knee  Difficulty walking  Rationale for Evaluation and Treatment: Rehabilitation  ONSET DATE: A year ago  SUBJECTIVE:   SUBJECTIVE STATEMENT: PERTINENT HISTORY: Hx of stroke, osteoporosis, R meniscus tear 1 year ago, hx of lumbar fx PAIN:  Are you having pain? Yes: NPRS scale: 0 Pain location: around the knee cap Pain description: aching Aggravating factors: prolonged standing <  30 minutes, kneel, stairs Relieving factors: heat, laying down  PRECAUTIONS: Other: per patient, MD states that she's not allowed to use the treadmill and the low bike and can only use the elliptical, needs to wear the brace all the time (including exercises) except when resting  RED FLAGS: Recent episode of fall which made the back pain worse    WEIGHT BEARING RESTRICTIONS: No  FALLS:  Has patient fallen in last 6 months? Yes. Number of falls 18 (latest was yesterday)  LIVING ENVIRONMENT: Lives with: lives with their spouse Lives in: Mobile home Stairs: Yes: External: 2 steps; can reach both Has following equipment at home:  Single point cane  OCCUPATION: on disability  PLOF: Independent and Needs assistance with transfers  PATIENT GOALS: "to be able to get back to how she was last year"  NEXT MD VISIT: as needed  OBJECTIVE:  Note: Objective measures were completed at Evaluation unless otherwise noted.  DIAGNOSTIC FINDINGS:  07/06/23 Imaging left knee no fracture no dislocation no joint space narrowing   Mild to moderate patellar tilt without subluxation   Impression patellar tilt left knee  PATIENT SURVEYS:  FOTO 31.28  COGNITION: Overall cognitive status: Impaired     SENSATION: Not tested   MUSCLE LENGTH: Hamstrings: mild to moderate restriction on B  POSTURE: rounded shoulders, forward head, decreased lumbar lordosis, and knees flexed  PALPATION: Grade 1 tenderness on the R patella  LOWER EXTREMITY ROM:  Active ROM Right eval Left eval  Hip flexion [facility]  Hip extension    Hip abduction    Hip adduction    Hip internal rotation    Hip external rotation    Knee flexion 115 (done in sitting) WFL  Knee extension 0 WFL  Ankle dorsiflexion [facility]  Ankle plantarflexion [facility] WFL  Ankle inversion    Ankle eversion     (Blank rows = not tested)  LOWER EXTREMITY MMT:  MMT Right eval Left eval  Hip flexion 4- 4-  Hip extension    Hip abduction 4- 4-  Hip adduction    Hip internal rotation    Hip external rotation    Knee flexion 4- 4-  Knee extension 4- 4-  Ankle dorsiflexion 4+ 3+  Ankle plantarflexion 4+ 4+  Ankle inversion    Ankle eversion     (Blank rows = not tested)  FUNCTIONAL TESTS:  5 times sit to stand: 08/08/23 12.78 2 minute walk test: 08/08/23 345 ft  with decreased ankle motion   GAIT: Distance walked: around 40 ft Assistive device utilized: None Level of assistance: Complete Independence Comments: stooped posture, knees flexed on midstance, decreased hip ext on swing phase, foot drop on L                                                                                                                                 TREATMENT DATE:  08/16/23 Heelraise x 15 Squat x  15 Lunge onto 4" step B x 10 each Step up B x 15  Lateral step up x 10 B  Standing toe raise x 10 Terminal extension with green theraband x 10 Each  Hip extension B x 10 Sit:  Sit to stand x 15  Bike x 5 minutes to increase ROM    08/08/23 sit to stand and 2 minute walk test  Heel raise x 10 Squat x 10  Sit to stand x 10  4" step up B x 10 Bridge x 10 Sitting Dorsiflexion x 10    Bike x 5 minutes with seat at 4 to increase ROM   07/07/23 Evaluation and patient education done    PATIENT EDUCATION:  Education details: Educated on the pathoanatomy of Knee OA. Educated on the goals and course of rehab. Educated on the importance of having her spine evaluated ASAP after a fall Person educated: Patient Education method: Explanation Education comprehension: needs further education  HOME EXERCISE PROGRAM: Access Code: [email or web address] URL: [email or web address] Date: 08/08/2023 Prepared by: Virgina Organ  Exercises - Heel Raises with Counter Support  - 2 x daily - 7 x weekly - 1 sets - 10 reps - Mini Squat with Counter Support  - 2 x daily - 7 x weekly - 1 sets - 10 reps - 3-5" hold - Sit to Stand  - 2 x daily - 7 x weekly - 1 sets - 10 reps - Forward Step Up with Counter Support  - 2 x daily - 7 x weekly - 1 sets - 10 reps - Supine Bridge  - 2 x daily - 7 x weekly - 1 sets - 10 reps - 3-5" hold  08/16/23 Lateral step up Lunging  ASSESSMENT:  CLINICAL IMPRESSION: Pt needs less verbal cuing to perform exercises in proper position. Therapist continued to advance pt strength and balance; advanced  HEP.  PT continues to have decreased ROM, decreased strength and decreased balance and will benefit from skilled PT.    OBJECTIVE IMPAIRMENTS: Abnormal gait, decreased balance, difficulty walking, decreased strength, impaired flexibility, postural dysfunction,  and pain.   ACTIVITY LIMITATIONS: carrying, lifting, bending, sitting, standing, squatting, and stairs  PARTICIPATION LIMITATIONS: meal prep, cleaning, laundry, driving, shopping, community activity, and yard work  PERSONAL FACTORS: Fitness, Time since onset of injury/illness/exacerbation, and 3+ comorbidities: Hx of stroke, osteoporosis, R meniscus tear 1 year ago, hx of lumbar fx  are also affecting patient's functional outcome.   REHAB POTENTIAL: Fair    CLINICAL DECISION MAKING: Evolving/moderate complexity  EVALUATION COMPLEXITY: Moderate   GOALS: Goals reviewed with patient? Yes  SHORT TERM GOALS: Target date: 07/21/23  Pt will demonstrate indep in HEP to facilitate carry-over of skilled services and improve functional outcomes Goal status: on-going   LONG TERM GOALS: Target date: 08/04/23  Pt will increase FOTO to at least 45 in order to demonstrate significant improvement in function related to ADLs and ambulation Baseline: 31.28 Goal status: void not completing foto any longer  2.  Pt will demonstrate increase in LE strength to 4/5 to facilitate ease and safety in ambulation Baseline: 3+/5 Goal status: on-going 3.  Pt will decrease 5TSTS by at least 3 seconds in order to demonstrate clinically significant improvement in LE strength  Baseline: to be determined Goal status: on-going  4.  Pt will increase LEFS by at least 9 points in order to demonstrate significant improvement in lower extremity function. Baseline: to be determined Goal status: on-going    PLAN:  PT FREQUENCY:  2x/week  PT DURATION: 4 weeks  PLANNED INTERVENTIONS: [id number]- PT Re-evaluation, 97110-Therapeutic exercises, 97530- Therapeutic activity, [id number]- Neuromuscular re-education, 97535- Self Care, 16109- Manual therapy, [phone]- Gait training, and Patient/Family education  PLAN FOR NEXT SESSION: continue  LE strengthening, flexibility, and balance exercises  Virgina Organ, PT CLT [phone]

## 2023-08-17 ENCOUNTER — Other Ambulatory Visit (INDEPENDENT_AMBULATORY_CARE_PROVIDER_SITE_OTHER): Payer: Self-pay | Admitting: Gastroenterology

## 2023-08-17 DIAGNOSIS — K638219 Small intestinal bacterial overgrowth, unspecified: Secondary | ICD-10-CM

## 2023-08-17 MED ORDER — RIFAXIMIN 550 MG PO TABS: 550.0000 mg | ORAL_TABLET | Freq: Three times a day (TID) | ORAL | 0 refills | Status: DC

## 2023-08-18 ENCOUNTER — Ambulatory Visit (HOSPITAL_COMMUNITY): Payer: PPO | Admitting: Physical Therapy

## 2023-08-18 DIAGNOSIS — R262 Difficulty in walking, not elsewhere classified: Secondary | ICD-10-CM

## 2023-08-18 DIAGNOSIS — G8929 Other chronic pain: Secondary | ICD-10-CM

## 2023-08-18 DIAGNOSIS — M6281 Muscle weakness (generalized): Secondary | ICD-10-CM

## 2023-08-18 NOTE — Therapy (Addendum)
OUTPATIENT PHYSICAL THERAPY LOWER EXTREMITY EVALUATION   Patient Name: [name] MRN: [id number] DOB:10-Dec-1960, 63 y.o., female Today's Date: 08/18/2023  END OF SESSION:   PT End of Session - 08/18/23 1055     Visit Number 4    Number of Visits 8    Date for PT Re-Evaluation 09/06/23    Authorization Type [employer] (no auth, no limit)    Progress Note Due on Visit 8    PT Start Time 1017    PT Stop Time 1057    PT Time Calculation (min) 40 min    Activity Tolerance Patient tolerated treatment well    Behavior During Therapy WFL for tasks assessed/performed                Past Medical History:  Diagnosis Date   Allergy    grass, dust , mold   Anxiety    Arthritis    Asthma due to seasonal allergies 06/15/2020   Bipolar disorder (HCC)    Carpal tunnel syndrome    Bilateral   Chest pain 09/2011   Cardiac cath-normal coronaries   Constipation    Depression    Difficulty urinating 05/31/2013   Elevated LFTs 12/16/2013   Encounter for general adult medical examination with abnormal findings 07/07/2021   GERD (gastroesophageal reflux disease)    History of kidney stones    Hyperlipemia    Hyperlipidemia    Hypertension    Mild; provoked by stress and anxiety   IBS (irritable bowel syndrome)    Intracerebral bleed (HCC)    No aneurysm; followed by Dr. Athena Masse replaced    "lense transplant" 2022; pt states lense don't dilate or constrict   Loss of weight 01/06/2015   Osteoporosis    Stage 3a chronic kidney disease (HCC) 03/16/2021   Stroke (HCC) 1999   hemorrhagic stroke; weakness of left side   Past Surgical History:  Procedure Laterality Date   BIOPSY  04/16/2021   Procedure: BIOPSY;  Surgeon: Dolores Frame, MD;  Location: [facility];  Service: Gastroenterology;;  small, bowel, esophageal(proximal and distal);   BIOPSY  07/21/2023   Procedure: BIOPSY;  Surgeon: Marguerita Merles, Reuel Boom, MD;  Location: [facility];  Service: Gastroenterology;;   BRAIN SURGERY  1999   to remove blood clot after stroke    CARDIAC CATHETERIZATION  2016   CERVICAL FUSION     CHOLECYSTECTOMY N/A 10/14/2014   Procedure: LAPAROSCOPIC CHOLECYSTECTOMY WITH INTRAOPERATIVE CHOLANGIOGRAM;  Surgeon: Avel Peace, MD;  Location: [facility];  Service: General;  Laterality: N/A;   CHONDROPLASTY Right 07/13/2017   Procedure: CHONDROPLASTY of patella;  Surgeon: Vickki Hearing, MD;  Location: [facility];  Service: Orthopedics;  Laterality: Right;   ESOPHAGEAL DILATION N/A 04/16/2021   Procedure: ESOPHAGEAL DILATION;  Surgeon: Dolores Frame, MD;  [facility]: [facility];  Service: Gastroenterology;  Laterality: N/A;   ESOPHAGOGASTRODUODENOSCOPY (EGD) WITH PROPOFOL N/A 04/16/2021   Procedure: ESOPHAGOGASTRODUODENOSCOPY (EGD) WITH PROPOFOL;  Surgeon: Dolores Frame, MD;  [facility];  Service: Gastroenterology;  Laterality: N/A;  1:35, pt knows to arrive at 9:45   ESOPHAGOGASTRODUODENOSCOPY (EGD) WITH PROPOFOL N/A 07/21/2023   Procedure: ESOPHAGOGASTRODUODENOSCOPY (EGD) WITH PROPOFOL;  Surgeon: Dolores Frame, MD;  [facility];  Service: Gastroenterology;  Laterality: N/A;  9:15AM;ASA 1-2   EUS N/A 08/21/2015   Procedure: ESOPHAGEAL ENDOSCOPIC ULTRASOUND (EUS) RADIAL;  Surgeon: [name], MD;  Location: [facility];  Service: Endoscopy;  Laterality:  N/A;   KNEE ARTHROSCOPY WITH MEDIAL MENISECTOMY Right 07/13/2017   Procedure: KNEE ARTHROSCOPY WITH PARTIAL MEDIAL MENISECTOMY;  Surgeon: Vickki Hearing, MD;  Location: [facility];  Service: Orthopedics;  Laterality: Right;   LEFT HEART CATHETERIZATION WITH CORONARY ANGIOGRAM N/A 09/23/2011   Procedure: LEFT HEART CATHETERIZATION WITH CORONARY ANGIOGRAM;  Surgeon: Vesta Mixer, MD;  Location: [facility];  Service: Cardiovascular;  Laterality: N/A;   LIPOMA EXCISION Left 11/18/2013   Procedure: EXCISION OF SOFT TISSUE MASS-LEFT  THIGH;  Surgeon: Dalia Heading, MD;  Location: [facility];  Service: General;  Laterality: Left;   NASAL SEPTOPLASTY W/ TURBINOPLASTY Bilateral 08/30/2021   Procedure: NASAL SEPTOPLASTY WITH BILATERAL TURBINATE REDUCTION;  Surgeon: Newman Pies, MD;  Location: [facility];  Service: ENT;  Laterality: Bilateral;   POLYPECTOMY  04/16/2021   Procedure: POLYPECTOMY;  Surgeon: Dolores Frame, MD;  [facility];  Service: Gastroenterology;;  gastric   POLYPECTOMY  07/21/2023   Procedure: POLYPECTOMY;  Surgeon: Dolores Frame, MD;  [facility];  Service: Gastroenterology;;   RECTOCELE REPAIR     x2   RECTOCELE REPAIR N/A 04/04/2017   Procedure: POSTERIOR REPAIR (RECTOCELE);  Surgeon: Tilda Burrow, MD;  Location: [facility];  Service: Gynecology;  Laterality: N/A;   SUBMUCOSAL LIFTING INJECTION  07/21/2023   Procedure: SUBMUCOSAL LIFTING INJECTION;  Surgeon: Marguerita Merles, Reuel Boom, MD;  Location: [facility];  Service: Gastroenterology;;   TOTAL ABDOMINAL HYSTERECTOMY     Patient Active Problem List   Diagnosis Date Noted   Sprain of left wrist 08/04/2023   Small intestinal bacterial overgrowth (SIBO) 12/08/2022   Chronic fatigue 11/09/2022   Family history of breast cancer 11/08/2022   Osteoporosis with current pathological fracture 08/29/2022   Intestinal methanogen overgrowth 04/28/2022   History of total abdominal hysterectomy 03/28/2022   Dystrophia unguium 02/01/2022   Lumbar compression fracture (HCC) 12/23/2021   Lumbar spondylosis 11/04/2021   Encounter for general adult medical examination with abnormal findings 07/07/2021   Gastric polyp 05/31/2021   NASH (nonalcoholic steatohepatitis) 04/01/2021   Dysphagia 04/01/2021   Stage 3a chronic kidney disease (HCC) 03/16/2021   Arthritis 01/31/2021   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   History of hemorrhagic stroke with residual hemiplegia (HCC) 06/15/2020   Urinary incontinence  06/15/2020   Asthma due to seasonal allergies 06/15/2020   Vaginal itching 03/16/2020   S/P right knee arthroscopy 07/13/17 07/20/2017   Derangement of posterior horn of medial meniscus of right knee    Chondromalacia, patella, right    Irritable bowel syndrome with constipation and diarrhea 05/11/2015   Gait instability 01/06/2015   DDD (degenerative disc disease), lumbar 02/18/2014   Postmenopausal atrophic vaginitis 11/01/2013   Lipoma of abdominal wall 08/14/2013   Paresthesia of foot 02/12/2013   Hypertension    Depression 11/17/2011   Insomnia 11/17/2011   Mixed hyperlipidemia 09/20/2011   Anxiety 09/20/2011    PCP: Trena Platt, MD  REFERRING PROVIDER: Vickki Hearing, MD  REFERRING DIAG: M17.11 (ICD-10-CM) - Primary osteoarthritis of right knee R26.9 (ICD-10-CM) - Gait disturbance  THERAPY DIAG:  Muscle weakness (generalized)  Chronic pain of right knee  Difficulty walking  Rationale for Evaluation and Treatment: Rehabilitation  ONSET DATE: A year ago  SUBJECTIVE:   SUBJECTIVE STATEMENT: PT states that when she goes to walk she feels like someone is behind her pushing her forward.  Her knee is feeling better.   PERTINENT HISTORY: Hx of stroke, osteoporosis, R meniscus tear 1  year ago, hx of lumbar fx PAIN:  Are you having pain? Yes: NPRS scale: 0 Pain location: around the knee cap Pain description: aching Aggravating factors: prolonged standing < 30 minutes, kneel, stairs Relieving factors: heat, laying down  PRECAUTIONS: Other: per patient, MD states that she's not allowed to use the treadmill and the low bike and can only use the elliptical, needs to wear the brace all the time (including exercises) except when resting  RED FLAGS: Recent episode of fall which made the back pain worse    WEIGHT BEARING RESTRICTIONS: No  FALLS:  Has patient fallen in last 6 months? Yes. Number of falls 18 (latest was yesterday)  LIVING ENVIRONMENT: Lives with:  lives with their spouse Lives in: Mobile home Stairs: Yes: External: 2 steps; can reach both Has following equipment at home: Single point cane  OCCUPATION: on disability  PLOF: Independent and Needs assistance with transfers  PATIENT GOALS: "to be able to get back to how she was last year"  NEXT MD VISIT: as needed  OBJECTIVE:  Note: Objective measures were completed at Evaluation unless otherwise noted.  DIAGNOSTIC FINDINGS:  07/06/23 Imaging left knee no fracture no dislocation no joint space narrowing   Mild to moderate patellar tilt without subluxation   Impression patellar tilt left knee  PATIENT SURVEYS:  FOTO 31.28  COGNITION: Overall cognitive status: Impaired     SENSATION: Not tested   MUSCLE LENGTH: Hamstrings: mild to moderate restriction on B  POSTURE: rounded shoulders, forward head, decreased lumbar lordosis, and knees flexed  PALPATION: Grade 1 tenderness on the R patella  LOWER EXTREMITY ROM:  Active ROM Right eval Left eval  Hip flexion [facility]  Hip extension    Hip abduction    Hip adduction    Hip internal rotation    Hip external rotation    Knee flexion 115 (done in sitting) WFL  Knee extension 0 WFL  Ankle dorsiflexion [facility]  Ankle plantarflexion [facility] WFL  Ankle inversion    Ankle eversion     (Blank rows = not tested)  LOWER EXTREMITY MMT:  MMT Right eval Left eval  Hip flexion 4- 4-  Hip extension    Hip abduction 4- 4-  Hip adduction    Hip internal rotation    Hip external rotation    Knee flexion 4- 4-  Knee extension 4- 4-  Ankle dorsiflexion 4+ 3+  Ankle plantarflexion 4+ 4+  Ankle inversion    Ankle eversion     (Blank rows = not tested)  FUNCTIONAL TESTS:  5 times sit to stand: 08/08/23 12.78 2 minute walk test: 08/08/23 345 ft  with decreased ankle motion   GAIT: Distance walked: around 40 ft Assistive device utilized: None Level of assistance: Complete Independence Comments: stooped posture, knees  flexed on midstance, decreased hip ext on swing phase, foot drop on L  TREATMENT DATE:  08/18/23 PWR  UP, ROCK, TWIST, and step    Sitting x 10   Supine x 5   Prone x5 POWER up and rock only  Sit to stand x 10  Prone hip extension x 10 B  Side step x 2 RT  Wall arch x 10  Bike x 5 minutes   08/16/23 Heelraise x 15 Squat x 15 Lunge onto 4" step B x 10 each Step up B x 15  Lateral step up x 10 B  Standing toe raise x 10 Terminal extension with green theraband x 10 Each  Hip extension B x 10 Sit:  Sit to stand x 15  Bike x 5 minutes to increase ROM    08/08/23 sit to stand and 2 minute walk test  Heel raise x 10 Squat x 10  Sit to stand x 10  4" step up B x 10 Bridge x 10 Sitting Dorsiflexion x 10    Bike x 5 minutes with seat at 4 to increase ROM   07/07/23 Evaluation and patient education done    PATIENT EDUCATION:  Education details: Educated on the pathoanatomy of Knee OA. Educated on the goals and course of rehab. Educated on the importance of having her spine evaluated ASAP after a fall Person educated: Patient Education method: Explanation Education comprehension: needs further education  HOME EXERCISE PROGRAM: Access Code: [email or web address] URL: [email or web address] Date: 08/08/2023 Prepared by: Virgina Organ  Exercises - Heel Raises with Counter Support  - 2 x daily - 7 x weekly - 1 sets - 10 reps - Mini Squat with Counter Support  - 2 x daily - 7 x weekly - 1 sets - 10 reps - 3-5" hold - Sit to Stand  - 2 x daily - 7 x weekly - 1 sets - 10 reps - Forward Step Up with Counter Support  - 2 x daily - 7 x weekly - 1 sets - 10 reps - Supine Bridge  - 2 x daily - 7 x weekly - 1 sets - 10 reps - 3-5" hold  08/16/23 Lateral step up Lunging  112/14/25 PWR sitting, supine and prone  ASSESSMENT:  CLINICAL IMPRESSION: Pt feels  that when she walks she is being pushed forward which is making her feel unsteady therefore Therapist added PWR motion into exercise routine.  PT continues to have decreased ROM, decreased strength and decreased balance and will benefit from skilled PT.    OBJECTIVE IMPAIRMENTS: Abnormal gait, decreased balance, difficulty walking, decreased strength, impaired flexibility, postural dysfunction, and pain.   ACTIVITY LIMITATIONS: carrying, lifting, bending, sitting, standing, squatting, and stairs  PARTICIPATION LIMITATIONS: meal prep, cleaning, laundry, driving, shopping, community activity, and yard work  PERSONAL FACTORS: Fitness, Time since onset of injury/illness/exacerbation, and 3+ comorbidities: Hx of stroke, osteoporosis, R meniscus tear 1 year ago, hx of lumbar fx  are also affecting patient's functional outcome.   REHAB POTENTIAL: Fair    CLINICAL DECISION MAKING: Evolving/moderate complexity  EVALUATION COMPLEXITY: Moderate   GOALS: Goals reviewed with patient? Yes  SHORT TERM GOALS: Target date: 07/21/23  Pt will demonstrate indep in HEP to facilitate carry-over of skilled services and improve functional outcomes Goal status: on-going   LONG TERM GOALS: Target date: 08/04/23  Pt will increase FOTO to at least 45 in order to demonstrate significant improvement in function related to ADLs and ambulation Baseline: 31.28 Goal status: void not completing foto any longer  2.  Pt will demonstrate increase in LE strength to  4/5 to facilitate ease and safety in ambulation Baseline: 3+/5 Goal status: on-going 3.  Pt will decrease 5TSTS by at least 3 seconds in order to demonstrate clinically significant improvement in LE strength  Baseline: to be determined Goal status: on-going  4.  Pt will increase LEFS by at least 9 points in order to demonstrate significant improvement in lower extremity function. Baseline: to be determined Goal status: on-going    PLAN:  PT  FREQUENCY: 2x/week  PT DURATION: 4 weeks  PLANNED INTERVENTIONS: [id number]- PT Re-evaluation, 97110-Therapeutic exercises, 97530- Therapeutic activity, [id number]- Neuromuscular re-education, 97535- Self Care, [id number]- Manual therapy, [phone]- Gait training, and Patient/Family education  PLAN FOR NEXT SESSION: continue  LE strengthening, flexibility, and balance exercises  Virgina Organ, PT CLT [phone]

## 2023-08-21 ENCOUNTER — Other Ambulatory Visit: Payer: Self-pay | Admitting: Internal Medicine

## 2023-08-21 ENCOUNTER — Ambulatory Visit: Payer: PPO | Admitting: Orthopedic Surgery

## 2023-08-21 ENCOUNTER — Encounter: Payer: Self-pay | Admitting: Internal Medicine

## 2023-08-21 DIAGNOSIS — S32020S Wedge compression fracture of second lumbar vertebra, sequela: Secondary | ICD-10-CM

## 2023-08-21 DIAGNOSIS — M47816 Spondylosis without myelopathy or radiculopathy, lumbar region: Secondary | ICD-10-CM

## 2023-08-21 MED ORDER — TIZANIDINE HCL 2 MG PO TABS: 2.0000 mg | ORAL_TABLET | Freq: Three times a day (TID) | ORAL | 1 refills | Status: DC | PRN

## 2023-08-23 ENCOUNTER — Encounter (HOSPITAL_COMMUNITY): Payer: PPO

## 2023-08-24 ENCOUNTER — Encounter: Payer: Self-pay | Admitting: Orthopaedic Surgery

## 2023-08-25 ENCOUNTER — Encounter (HOSPITAL_COMMUNITY): Payer: PPO | Admitting: Physical Therapy

## 2023-08-28 ENCOUNTER — Encounter: Payer: Self-pay | Admitting: Orthopaedic Surgery

## 2023-08-29 ENCOUNTER — Other Ambulatory Visit: Payer: Self-pay | Admitting: Internal Medicine

## 2023-08-29 ENCOUNTER — Encounter: Payer: Self-pay | Admitting: Internal Medicine

## 2023-08-29 DIAGNOSIS — M47816 Spondylosis without myelopathy or radiculopathy, lumbar region: Secondary | ICD-10-CM

## 2023-08-29 MED ORDER — PREGABALIN 50 MG PO CAPS
50.0000 mg | ORAL_CAPSULE | Freq: Every day | ORAL | 3 refills | Status: DC
Start: 2023-08-29 — End: 2023-09-05

## 2023-08-30 ENCOUNTER — Encounter (HOSPITAL_COMMUNITY): Payer: PPO | Admitting: Physical Therapy

## 2023-09-02 ENCOUNTER — Encounter: Payer: Self-pay | Admitting: Internal Medicine

## 2023-09-05 ENCOUNTER — Encounter (HOSPITAL_COMMUNITY): Payer: Self-pay | Admitting: Physical Therapy

## 2023-09-05 ENCOUNTER — Other Ambulatory Visit: Payer: Self-pay | Admitting: Internal Medicine

## 2023-09-05 DIAGNOSIS — M47816 Spondylosis without myelopathy or radiculopathy, lumbar region: Secondary | ICD-10-CM

## 2023-09-05 MED ORDER — PREGABALIN 25 MG PO CAPS: 25.0000 mg | ORAL_CAPSULE | Freq: Every day | ORAL | 3 refills | Status: DC

## 2023-09-05 NOTE — Therapy (Unsigned)
 PHYSICAL THERAPY DISCHARGE SUMMARY  Visits from Start of Care: 4  Current functional level related to goals / functional outcomes: Unknown as pt did not return   Remaining deficits: Unknown    Education / Equipment: HEP   Patient agrees to discharge. Patient goals were  unknown  . Patient is being discharged due to a change in medical status.  Virgina Organ, PT CLT [phone]

## 2023-09-06 ENCOUNTER — Encounter (HOSPITAL_COMMUNITY): Payer: PPO | Admitting: Physical Therapy

## 2023-09-07 ENCOUNTER — Encounter: Payer: Self-pay | Admitting: Orthopedic Surgery

## 2023-09-08 ENCOUNTER — Telehealth: Payer: Self-pay | Admitting: Radiology

## 2023-09-08 NOTE — Telephone Encounter (Signed)
 I have called [facility] in [place] and left message for return call. Patient saw them beginning of last year for her back. Patient is unable to attend PT at the moment and would like some home exercises she can work on. Per Dr. Ophelia Charter, try and get them from therapy. Since they have worked with patient previously for her back, asking to see if they have HEP she could do.

## 2023-09-10 ENCOUNTER — Other Ambulatory Visit: Payer: Self-pay | Admitting: Internal Medicine

## 2023-09-10 DIAGNOSIS — M62838 Other muscle spasm: Secondary | ICD-10-CM

## 2023-09-11 ENCOUNTER — Other Ambulatory Visit (INDEPENDENT_AMBULATORY_CARE_PROVIDER_SITE_OTHER): Payer: Self-pay

## 2023-09-11 ENCOUNTER — Ambulatory Visit: Admitting: Orthopedic Surgery

## 2023-09-11 ENCOUNTER — Encounter: Payer: Self-pay | Admitting: Orthopedic Surgery

## 2023-09-11 DIAGNOSIS — M79671 Pain in right foot: Secondary | ICD-10-CM

## 2023-09-11 DIAGNOSIS — M79672 Pain in left foot: Secondary | ICD-10-CM

## 2023-09-11 NOTE — Progress Notes (Signed)
 Office Visit Note   Patient: [name]           Date of Birth: [birth year]           MRN: [id number] Visit Date: 09/11/2023              Requested by: Anabel Halon, MD [address] [place],  [place] [address] PCP: Anabel Halon, MD  Chief Complaint  Patient presents with   Right Ankle - Pain   Left Ankle - Pain      HPI: Patient is a 63 year old woman who presents with bilateral ankle and foot pain.  She states she has ankle pain that is anterior to the joint line.  She has completed physical therapy.  She has pain with walking.  Patient has good support of cork orthotics.  She has difficulty with balance and weakness secondary to her stroke.  Assessment & Plan: Visit Diagnoses:  1. Bilateral foot pain   2. Foot pain, bilateral     Plan: Recommended strengthening.  Patient does have a history of osteoporosis without stress fractures.  Recommend strengthening.  Follow-Up Instructions: Return if symptoms worsen or fail to improve.   Ortho Exam  Patient is alert, oriented, no adenopathy, well-dressed, normal affect, normal respiratory effort. Examination of both feet she has good ankle and subtalar motion she can do toe raises on both feet.  She does have a cavovarus deformity bilaterally.  There are ankles contractures bilaterally with dorsiflexion only to neutral.  No posterior tibial tendon insufficiency.  Patient has difficulty standing without support.  Imaging: XR Foot 2 Views Left Result Date: 09/11/2023 2 view radiographs of the left foot shows normal joint spaces without fractures.  XR Ankle 2 Views Left Result Date: 09/11/2023 2 view radiographs of the left ankle shows a congruent mortise without osteochondral defect.  XR Foot 2 Views Right Result Date: 09/11/2023 2 view radiographs of the right foot shows normal joint spaces without fractures.  XR Ankle 2 Views Right Result Date: 09/11/2023 2 view radiographs of the right ankle shows a congruent  mortise without fracture.  No images are attached to the encounter.  Labs: Lab Results  Component Value Date   HGBA1C 5.5 07/31/2023   HGBA1C 5.7 (H) 02/10/2023   HGBA1C 5.8 (H) 08/25/2022   ESRSEDRATE 2 02/13/2015   LABORGA Multiple bacterial morphotypes present, none 06/24/2015   LABORGA predominant. Suggest appropriate recollection if 06/24/2015   LABORGA clinically indicated. 06/24/2015     Lab Results  Component Value Date   ALBUMIN 4.7 07/31/2023   ALBUMIN 4.8 02/10/2023   ALBUMIN 4.6 08/25/2022    No results found for: "MG" Lab Results  Component Value Date   VD25OH 86.6 02/10/2023   VD25OH 77.5 08/25/2022   VD25OH 53.4 11/01/2021    No results found for: "PREALBUMIN"    Latest Ref Rng & Units 07/31/2023    2:40 PM 02/10/2023    8:52 AM 08/25/2022    8:14 AM  CBC EXTENDED  WBC 3.4 - 10.8 x10E3/uL 6.5  8.0  11.5   RBC 3.77 - 5.28 x10E6/uL 3.94  4.04  3.77   Hemoglobin 11.1 - 15.9 g/dL 19.1  47.8  29.5   HCT 34.0 - 46.6 % 39.3  39.6  36.7   Platelets 150 - 450 x10E3/uL 251  326  492   NEUT# 1.4 - 7.0 x10E3/uL 3.5   8.4   Lymph# 0.7 - 3.1 x10E3/uL 2.4   2.1  There is no height or weight on file to calculate BMI.  Orders:  Orders Placed This Encounter  Procedures   XR Foot 2 Views Left   XR Ankle 2 Views Left   XR Foot 2 Views Right   XR Ankle 2 Views Right   No orders of the defined types were placed in this encounter.    Procedures: No procedures performed  Clinical Data: No additional findings.  ROS:  All other systems negative, except as noted in the HPI. Review of Systems  Objective: Vital Signs: There were no vitals taken for this visit.  Specialty Comments:  No specialty comments available.  PMFS History: Patient Active Problem List   Diagnosis Date Noted   Sprain of left wrist 08/04/2023   Small intestinal bacterial overgrowth (SIBO) 12/08/2022   Chronic fatigue 11/09/2022   Family history of breast cancer 11/08/2022    Osteoporosis with current pathological fracture 08/29/2022   Intestinal methanogen overgrowth 04/28/2022   History of total abdominal hysterectomy 03/28/2022   Dystrophia unguium 02/01/2022   Lumbar compression fracture (HCC) 12/23/2021   Lumbar spondylosis 11/04/2021   Encounter for general adult medical examination with abnormal findings 07/07/2021   Gastric polyp 05/31/2021   NASH (nonalcoholic steatohepatitis) 04/01/2021   Dysphagia 04/01/2021   Stage 3a chronic kidney disease (HCC) 03/16/2021   Arthritis 01/31/2021   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   History of hemorrhagic stroke with residual hemiplegia (HCC) 06/15/2020   Urinary incontinence 06/15/2020   Asthma due to seasonal allergies 06/15/2020   Vaginal itching 03/16/2020   S/P right knee arthroscopy 07/13/17 07/20/2017   Derangement of posterior horn of medial meniscus of right knee    Chondromalacia, patella, right    Irritable bowel syndrome with constipation and diarrhea 05/11/2015   Gait instability 01/06/2015   DDD (degenerative disc disease), lumbar 02/18/2014   Postmenopausal atrophic vaginitis 11/01/2013   Lipoma of abdominal wall 08/14/2013   Paresthesia of foot 02/12/2013   Hypertension    Depression 11/17/2011   Insomnia 11/17/2011   Mixed hyperlipidemia 09/20/2011   Anxiety 09/20/2011   Past Medical History:  Diagnosis Date   Allergy    grass, dust , mold   Anxiety    Arthritis    Asthma due to seasonal allergies 06/15/2020   Bipolar disorder (HCC)    Carpal tunnel syndrome    Bilateral   Chest pain 09/2011   Cardiac cath-normal coronaries   Constipation    Depression    Difficulty urinating 05/31/2013   Elevated LFTs 12/16/2013   Encounter for general adult medical examination with abnormal findings 07/07/2021   GERD (gastroesophageal reflux disease)    History of kidney stones    Hyperlipemia    Hyperlipidemia    Hypertension    Mild; provoked by stress and anxiety   IBS (irritable bowel  syndrome)    Intracerebral bleed (HCC)    No aneurysm; followed by Dr. Athena Masse replaced    "lense transplant" 2022; pt states lense don't dilate or constrict   Loss of weight 01/06/2015   Osteoporosis    Stage 3a chronic kidney disease (HCC) 03/16/2021   Stroke (HCC) 1999   hemorrhagic stroke; weakness of left side    Family History  Problem Relation Age of Onset   Cancer Mother        breast    Hypertension Mother    Hyperlipidemia Mother    Depression Mother    Anxiety disorder Mother    COPD Mother  Arthritis Mother        rheumatoid   Drug abuse Sister    Coronary artery disease Paternal Grandfather    Coronary artery disease Paternal Uncle    Depression Cousin    Drug abuse Cousin     Past Surgical History:  Procedure Laterality Date   BIOPSY  04/16/2021   Procedure: BIOPSY;  Surgeon: Dolores Frame, MD;  Location: [facility];  Service: Gastroenterology;;  small, bowel, esophageal(proximal and distal);   BIOPSY  07/21/2023   Procedure: BIOPSY;  Surgeon: Marguerita Merles, Reuel Boom, MD;  Location: [facility];  Service: Gastroenterology;;   BRAIN SURGERY  1999   to remove blood clot after stroke    CARDIAC CATHETERIZATION  2016   CERVICAL FUSION     CHOLECYSTECTOMY N/A 10/14/2014   Procedure: LAPAROSCOPIC CHOLECYSTECTOMY WITH INTRAOPERATIVE CHOLANGIOGRAM;  Surgeon: Avel Peace, MD;  Location: [facility] OR;  Service: General;  Laterality: N/A;   CHONDROPLASTY Right 07/13/2017   Procedure: CHONDROPLASTY of patella;  Surgeon: Vickki Hearing, MD;  Location: [facility];  Service: Orthopedics;  Laterality: Right;   ESOPHAGEAL DILATION N/A 04/16/2021   Procedure: ESOPHAGEAL DILATION;  Surgeon: Dolores Frame, MD;  Location: [facility];  Service: Gastroenterology;  Laterality: N/A;   ESOPHAGOGASTRODUODENOSCOPY (EGD) WITH PROPOFOL N/A 04/16/2021   Procedure: ESOPHAGOGASTRODUODENOSCOPY (EGD) WITH PROPOFOL;  Surgeon: Dolores Frame,  MD;  Location: [facility];  Service: Gastroenterology;  Laterality: N/A;  1:35, pt knows to arrive at 9:45   ESOPHAGOGASTRODUODENOSCOPY (EGD) WITH PROPOFOL N/A 07/21/2023   Procedure: ESOPHAGOGASTRODUODENOSCOPY (EGD) WITH PROPOFOL;  Surgeon: Dolores Frame, MD;  Location: [facility];  Service: Gastroenterology;  Laterality: N/A;  9:15AM;ASA 1-2   EUS N/A 08/21/2015   Procedure: ESOPHAGEAL ENDOSCOPIC ULTRASOUND (EUS) RADIAL;  Surgeon: [name], MD;  Location: [facility];  Service: Endoscopy;  Laterality: N/A;   KNEE ARTHROSCOPY WITH MEDIAL MENISECTOMY Right 07/13/2017   Procedure: KNEE ARTHROSCOPY WITH PARTIAL MEDIAL MENISECTOMY;  Surgeon: Vickki Hearing, MD;  Location: [facility];  Service: Orthopedics;  Laterality: Right;   LEFT HEART CATHETERIZATION WITH CORONARY ANGIOGRAM N/A 09/23/2011   Procedure: LEFT HEART CATHETERIZATION WITH CORONARY ANGIOGRAM;  Surgeon: Vesta Mixer, MD;  Location: [facility] CATH LAB;  Service: Cardiovascular;  Laterality: N/A;   LIPOMA EXCISION Left 11/18/2013   Procedure: EXCISION OF SOFT TISSUE MASS-LEFT THIGH;  Surgeon: Dalia Heading, MD;  Location: [facility];  Service: General;  Laterality: Left;   NASAL SEPTOPLASTY W/ TURBINOPLASTY Bilateral 08/30/2021   Procedure: NASAL SEPTOPLASTY WITH BILATERAL TURBINATE REDUCTION;  Surgeon: Newman Pies, MD;  Location: [facility];  Service: ENT;  Laterality: Bilateral;   POLYPECTOMY  04/16/2021   Procedure: POLYPECTOMY;  Surgeon: Dolores Frame, MD;  [facility]: [facility];  Service: Gastroenterology;;  gastric   POLYPECTOMY  07/21/2023   Procedure: POLYPECTOMY;  Surgeon: Dolores Frame, MD;  Location: [facility];  Service: Gastroenterology;;   RECTOCELE REPAIR     x2   RECTOCELE REPAIR N/A 04/04/2017   Procedure: POSTERIOR REPAIR (RECTOCELE);  Surgeon: Tilda Burrow, MD;  Location: [facility];  Service: Gynecology;  Laterality: N/A;   SUBMUCOSAL LIFTING INJECTION  07/21/2023    Procedure: SUBMUCOSAL LIFTING INJECTION;  Surgeon: Dolores Frame, MD;  Location: [facility];  Service: Gastroenterology;;   TOTAL ABDOMINAL HYSTERECTOMY     Social History   Occupational History   Occupation: unemployed    Comment: pending disability  Tobacco Use   Smoking status: Former  Current packs/day: 0.00    Average packs/day: 1 pack/day for 19.0 years (19.0 ttl pk-yrs)    Types: Cigarettes    Start date: 09/02/1978    Quit date: 09/01/1997    Years since quitting: 26.0    Passive exposure: Past   Smokeless tobacco: Never   Tobacco comments:    Quit smoking 1999 , previous 20 pack years  Vaping Use   Vaping status: Never Used  Substance and Sexual Activity   Alcohol use: Yes    Comment: 1 drink every other week   Drug use: No   Sexual activity: Not Currently    Partners: Male    Birth control/protection: Surgical    Comment: hyst

## 2023-09-13 ENCOUNTER — Encounter (HOSPITAL_COMMUNITY): Payer: PPO

## 2023-09-18 ENCOUNTER — Encounter: Payer: Self-pay | Admitting: Orthopedic Surgery

## 2023-09-18 ENCOUNTER — Encounter (HOSPITAL_COMMUNITY): Payer: PPO | Admitting: Physical Therapy

## 2023-09-20 ENCOUNTER — Encounter (HOSPITAL_COMMUNITY): Payer: Self-pay

## 2023-09-20 ENCOUNTER — Other Ambulatory Visit: Payer: Self-pay

## 2023-09-20 ENCOUNTER — Emergency Department (HOSPITAL_COMMUNITY)
Admission: EM | Admit: 2023-09-20 | Discharge: 2023-09-20 | Disposition: A | Discharge status: Home / Self Care | Attending: Emergency Medicine | Admitting: Emergency Medicine

## 2023-09-20 ENCOUNTER — Emergency Department (HOSPITAL_COMMUNITY)

## 2023-09-20 DIAGNOSIS — M7989 Other specified soft tissue disorders: Secondary | ICD-10-CM | POA: Diagnosis not present

## 2023-09-20 DIAGNOSIS — W19XXXA Unspecified fall, initial encounter: Secondary | ICD-10-CM | POA: Diagnosis not present

## 2023-09-20 DIAGNOSIS — S52502A Unspecified fracture of the lower end of left radius, initial encounter for closed fracture: Secondary | ICD-10-CM | POA: Insufficient documentation

## 2023-09-20 DIAGNOSIS — Y92009 Unspecified place in unspecified non-institutional (private) residence as the place of occurrence of the external cause: Secondary | ICD-10-CM | POA: Insufficient documentation

## 2023-09-20 DIAGNOSIS — S52615A Nondisplaced fracture of left ulna styloid process, initial encounter for closed fracture: Secondary | ICD-10-CM | POA: Diagnosis not present

## 2023-09-20 DIAGNOSIS — S6992XA Unspecified injury of left wrist, hand and finger(s), initial encounter: Secondary | ICD-10-CM | POA: Diagnosis present

## 2023-09-20 MED ORDER — HYDROCODONE-ACETAMINOPHEN 5-325 MG PO TABS
1.0000 | ORAL_TABLET | Freq: Once | ORAL | Status: AC
  Administered 2023-09-20: 1 via ORAL
  Filled 2023-09-20: qty 1

## 2023-09-20 MED ORDER — HYDROCODONE-ACETAMINOPHEN 5-325 MG PO TABS: 0.5000 | ORAL_TABLET | Freq: Four times a day (QID) | ORAL | 0 refills | Status: DC | PRN

## 2023-09-20 NOTE — ED Triage Notes (Signed)
 Pt arrived via POV c/o left wrist injury following a fall at home apprx 1 hr PTA. Swelling noted in triage. Pt reports she lost her balance and went to catch herself on the floor with her hands.

## 2023-09-20 NOTE — Discharge Instructions (Signed)
 Please follow-up closely with orthopedics on an outpatient basis.  Leave the splint in place until evaluated by orthopedics and do not get it wet.  Return to emergency department immediately for any new or worsening symptoms.

## 2023-09-20 NOTE — ED Provider Notes (Signed)
 [place] EMERGENCY DEPARTMENT AT [facility] Provider Note   CSN: [id number] Arrival date & time: 09/20/23  1605     History  Chief Complaint  Patient presents with   Wrist Injury    [name] is a 63 y.o. female.  Patient is a 63 year old female who presents to the emergency department the chief complaint of left wrist pain.  Patient notes that she had a fall outside of her home directly onto her left outstretched arm.  She did not strike her head or injure her neck or back.  She does admit to associated pain at the left wrist as well as swelling.  She denies any numbness or paresthesias distally.  She does note that the pain is worse with any attempted movement.  She denies any other long bone or joint pain at this time.   Wrist Injury      Home Medications Prior to Admission medications   Medication Sig Start Date End Date Taking? Authorizing Provider  acetaminophen (TYLENOL 8 HOUR) 650 MG CR tablet Take 650 mg by mouth every 8 (eight) hours.    [provider]  ALPRAZolam Prudy Feeler) 1 MG tablet Take 1 tablet (1 mg total) by mouth 3 (three) times daily. 07/07/23   Myrlene Broker, MD  atorvastatin (LIPITOR) 40 MG tablet TAKE ONE TABLET BY MOUTH ONCE DAILY. 06/22/23   Anabel Halon, MD  Blood Glucose Monitoring Suppl DEVI 1 each by Does not apply route in the morning, at noon, and at bedtime. May substitute to any manufacturer covered by patient's insurance. 12/12/22   Anabel Halon, MD  Calcium 500-125 MG-UNIT TABS Take 1 tablet by mouth daily with supper.    [provider]  Carboxymethylcellulose Sod PF 0.5 % SOLN Place 1 drop into both eyes daily as needed (dry eyes).    [provider]  cetirizine (ZYRTEC) 10 MG tablet Take 10 mg by mouth daily.    [provider]  diclofenac Sodium (VOLTAREN) 1 % GEL Apply 1 application topically daily as needed (pain).    [provider]  estradiol (ESTRACE) 0.1 MG/GM vaginal  cream Place 0.5 g vaginally 2 (two) times a week. Place 0.5g nightly for two weeks then twice a week after Patient not taking: Reported on 07/31/2023 07/22/21   Marguerita Beards, MD  ezetimibe (ZETIA) 10 MG tablet Take 1 tablet (10 mg total) by mouth daily. 05/23/23   Anabel Halon, MD  famotidine (PEPCID) 20 MG tablet Take 20 mg by mouth 2 (two) times daily. As needed    [provider]  ipratropium (ATROVENT) 0.03 % nasal spray Place 2 sprays into both nostrils every 12 (twelve) hours. 11/23/22   Alfonse Spruce, MD  lidocaine-prilocaine (EMLA) cream Apply 1 Application topically at bedtime.    [provider]  lisinopril (ZESTRIL) 20 MG tablet Take 1 tablet (20 mg total) by mouth daily. 04/10/23   Anabel Halon, MD  mirabegron ER (MYRBETRIQ) 50 MG TB24 tablet Take 1 tablet (50 mg total) by mouth daily. 04/17/23   Anabel Halon, MD  montelukast (SINGULAIR) 10 MG tablet Take 1 tablet (10 mg total) by mouth at bedtime. 06/26/23   Alfonse Spruce, MD  Multiple Vitamin (MULITIVITAMIN WITH MINERALS) TABS Take 1 tablet by mouth daily with breakfast.    [provider]  Nystatin POWD by Does not apply route. As needed.    [provider]  Olopatadine HCl 0.2 % SOLN Place  1 drop into both eyes in the morning and at bedtime. Patient not taking: Reported on 07/31/2023    [provider]  Omega-3 Fatty Acids (FISH OIL) 1000 MG CAPS Take 2,000 mg by mouth.    [provider]  omeprazole (PRILOSEC) 40 MG capsule Take 1 capsule (40 mg total) by mouth 2 (two) times daily. 07/21/23   Dolores Frame, MD  OVER THE COUNTER MEDICATION Milk thistle once per day.  Mushrooms once daily.  Vit K 2 once per day.  Beet root gummies daily    [provider]  pregabalin (LYRICA) 25 MG capsule Take 1 capsule (25 mg total) by mouth daily. 09/05/23   Anabel Halon, MD  Probiotic Product (TRUBIOTICS PO) Take 1 capsule by mouth daily.  Takes 25 cfu daily.    [provider]  pyridOXINE (VITAMIN B-6) 100 MG tablet Take 100 mg by mouth daily.    [provider]  rifaximin (XIFAXAN) 550 MG TABS tablet Take 1 tablet (550 mg total) by mouth 3 (three) times daily. 08/17/23   Dolores Frame, MD  tiZANidine (ZANAFLEX) 2 MG tablet Take 1 tablet (2 mg total) by mouth every 8 (eight) hours as needed for muscle spasms. 08/21/23   Anabel Halon, MD  traMADol-acetaminophen (ULTRACET) 37.5-325 MG tablet Take 1 tablet by mouth every 4 (four) hours as needed. 03/23/23   Vickki Hearing, MD  traZODone (DESYREL) 100 MG tablet TAKE (2) TABLETS BY MOUTH AT BEDTIME. 07/07/23   Myrlene Broker, MD  TYMLOS 3120 MCG/1.56ML SOPN One qhs 07/14/22   [provider]  venlafaxine XR (EFFEXOR XR) 150 MG 24 hr capsule Take 1 capsule (150 mg total) by mouth daily with breakfast. 07/07/23   Myrlene Broker, MD      Allergies    Morphine and codeine and Promethazine hcl    Review of Systems   Review of Systems  Musculoskeletal:        Left wrist pain  All other systems reviewed and are negative.   Physical Exam Updated Vital Signs BP 121/73   Pulse 73   Temp 98.2 F (36.8 C) (Oral)   Resp 16   Ht 5\' 3"  (1.6 m)   Wt 68 kg   SpO2 94%   BMI 26.56 kg/m  Physical Exam Vitals and nursing note reviewed.  Constitutional:      Appearance: Normal appearance.  HENT:     Head: Normocephalic and atraumatic.  Eyes:     Extraocular Movements: Extraocular movements intact.     Conjunctiva/sclera: Conjunctivae normal.     Pupils: Pupils are equal, round, and reactive to light.  Cardiovascular:     Rate and Rhythm: Normal rate and regular rhythm.     Pulses: Normal pulses.  Pulmonary:     Effort: Pulmonary effort is normal. No respiratory distress.  Musculoskeletal:     Cervical back: Normal range of motion and neck supple.     Comments: Tenderness palpation noted over the left wrist diffusely, nontender palpation  over left hand, elbow, shoulder, radial pulse 2+ upper extremities, cap refill less than 2 seconds distally, sensation intact distally, radial, ulnar, median nerve function intact distally, limited active and passive range of motion at left wrist secondary to pain, full range of motion noted to remainder bilateral upper extremities, edema noted over the left wrist, no obvious skin breakdown or ulceration, no lacerations or abrasions  Skin:    General: Skin is warm and dry.  Neurological:  General: No focal deficit present.     Mental Status: [name] is alert and oriented to person, place, and time. Mental status is at baseline.  Psychiatric:        Mood and Affect: Mood normal.        Behavior: Behavior normal.        Thought Content: Thought content normal.        Judgment: Judgment normal.     ED Results / Procedures / Treatments   Labs (all labs ordered are listed, but only abnormal results are displayed) Labs Reviewed - No data to display  EKG None  Radiology DG Forearm Left Result Date: 09/20/2023 CLINICAL DATA:  Left wrist and forearm injury from a fall. Last balanced a and went to catch self with hands on floor. EXAM: LEFT FOREARM - 2 VIEW; LEFT WRIST - COMPLETE 3+ VIEW COMPARISON:  Left forearm radiographs 08/04/2023, left wrist radiographs 03/23/2023 FINDINGS: There is diffuse decreased bone mineralization. Left wrist: There is new subtle predominantly transverse curvilinear lucency within the distal radial metaphysis and closed physis region, an acute nondisplaced fracture. Additional similar subacromial linear lucency within the base of the ulnar styloid. Unchanged tiny ossicle just lateral to the distal aspect of the ulnar styloid. Minimal triscaphe joint space narrowing and peripheral osteophytosis. Left forearm: No additional acute fracture is seen within the more proximal radius or ulna. IMPRESSION: 1. Acute nondisplaced fracture of the distal radial metaphysis and closed  physis region. 2. Acute nondisplaced fracture of the base of the ulnar styloid. Electronically Signed   By: Neita Garnet M.D.   On: 09/20/2023 18:01   DG Wrist Complete Left Result Date: 09/20/2023 CLINICAL DATA:  Left wrist and forearm injury from a fall. Last balanced a and went to catch self with hands on floor. EXAM: LEFT FOREARM - 2 VIEW; LEFT WRIST - COMPLETE 3+ VIEW COMPARISON:  Left forearm radiographs 08/04/2023, left wrist radiographs 03/23/2023 FINDINGS: There is diffuse decreased bone mineralization. Left wrist: There is new subtle predominantly transverse curvilinear lucency within the distal radial metaphysis and closed physis region, an acute nondisplaced fracture. Additional similar subacromial linear lucency within the base of the ulnar styloid. Unchanged tiny ossicle just lateral to the distal aspect of the ulnar styloid. Minimal triscaphe joint space narrowing and peripheral osteophytosis. Left forearm: No additional acute fracture is seen within the more proximal radius or ulna. IMPRESSION: 1. Acute nondisplaced fracture of the distal radial metaphysis and closed physis region. 2. Acute nondisplaced fracture of the base of the ulnar styloid. Electronically Signed   By: Neita Garnet M.D.   On: 09/20/2023 18:01    Procedures Procedures    Medications Ordered in ED Medications  HYDROcodone-acetaminophen (NORCO/VICODIN) 5-325 MG per tablet 1 tablet (has no administration in time range)    ED Course/ Medical Decision Making/ A&P                                 Medical Decision Making Patient is doing well at this time and is stable for discharge home.  Discussed with patient and she does have a fracture to her left distal radius and ulnar styloid.  Patient is neurovascularly intact distally.  She had no long bone or joint pain.  She did not strike her head during the fall.  Patient will be placed in a sugar-tong splint and recommend close follow-up with orthopedics on an  outpatient basis for continued management care.  Strict turn precautions were discussed for any new or worsening symptoms.  Patient voiced understanding and had no additional questions.  X-rays were independently reviewed by myself which demonstrates distal radius and ulnar styloid fracture.  Amount and/or Complexity of Data Reviewed Radiology: ordered.  Risk Prescription drug management.           Final Clinical Impression(s) / ED Diagnoses Final diagnoses:  None    Rx / DC Orders ED Discharge Orders     None         Lelon Perla, PA-C 09/20/23 1859    Bethann Berkshire, MD 09/22/23 1355

## 2023-09-20 NOTE — ED Notes (Signed)
 Patient discharged. Provider spoke to patient. Paperwork given to patient and reviewed. Pt verbalized understanding. VSS. A+Ox4. Patient ambulated out of the ER with steady, independent gait. No IV in place. Deanna, RN and NA john mark placed sugar tong splint and sling. Patient declines any N/T in arm or fingers. Able to wiggle all fingers. Cap refill <3 secs.

## 2023-09-22 ENCOUNTER — Encounter (HOSPITAL_COMMUNITY): Payer: PPO

## 2023-09-24 ENCOUNTER — Other Ambulatory Visit: Payer: Self-pay | Admitting: Internal Medicine

## 2023-09-24 DIAGNOSIS — I10 Essential (primary) hypertension: Secondary | ICD-10-CM

## 2023-09-25 ENCOUNTER — Encounter: Payer: Self-pay | Admitting: Orthopedic Surgery

## 2023-09-26 ENCOUNTER — Telehealth: Payer: Self-pay | Admitting: Orthopedic Surgery

## 2023-09-26 NOTE — Telephone Encounter (Signed)
 [name], I just spoke w/this pt, she stated she just chatted w/Amy in Mychart, but Amy is off today.  She has hurt her foot and stated that Amy told her to have [place] add extra time to her appt on Thursday so he can see her for both foot and wrist.  He doesn't have extra time, please advise.  When she called she wanted to speak to you.

## 2023-09-27 ENCOUNTER — Encounter (HOSPITAL_COMMUNITY): Payer: PPO

## 2023-09-28 ENCOUNTER — Ambulatory Visit: Admitting: Orthopedic Surgery

## 2023-09-28 ENCOUNTER — Other Ambulatory Visit (INDEPENDENT_AMBULATORY_CARE_PROVIDER_SITE_OTHER): Payer: Self-pay

## 2023-09-28 ENCOUNTER — Encounter: Payer: Self-pay | Admitting: Orthopedic Surgery

## 2023-09-28 VITALS — Ht 63.0 in | Wt 149.0 lb

## 2023-09-28 DIAGNOSIS — M79671 Pain in right foot: Secondary | ICD-10-CM

## 2023-09-28 DIAGNOSIS — S52552A Other extraarticular fracture of lower end of left radius, initial encounter for closed fracture: Secondary | ICD-10-CM

## 2023-09-28 NOTE — Progress Notes (Signed)
 Patient ID: [name], female   DOB: 1960/08/06, 63 y.o.   MRN: [id number]  ASSESSMENT AND PLAN  63 year old female with osteoporosis currently in treatment fell again injured left wrist right foot  Distal radius fracture left wrist casted.  X-ray in 6 weeks out of plaster  Right foot bruise placed in hard soled shoe  Chief Complaint  Patient presents with   Wrist Injury    Left 09/20/23     HPI [name] is a 63 y.o. female.   63 year old female was in her yard had a cane fell injured her left wrist and right foot.  The right foot injury actually occurred when she was trying to get up her foot was violently dorsiflexed back which she has a bruise and pain on the plantar aspect  Splinted in the ER in the left wrist is painful tender swollen   Review of Systems Review of Systems Denies numbness tingling no skin area issues  Past Medical History:  Diagnosis Date   Allergy    grass, dust , mold   Anxiety    Arthritis    Asthma due to seasonal allergies 06/15/2020   Bipolar disorder (HCC)    Carpal tunnel syndrome    Bilateral   Chest pain 09/2011   Cardiac cath-normal coronaries   Constipation    Depression    Difficulty urinating 05/31/2013   Elevated LFTs 12/16/2013   Encounter for general adult medical examination with abnormal findings 07/07/2021   GERD (gastroesophageal reflux disease)    History of kidney stones    Hyperlipemia    Hyperlipidemia    Hypertension    Mild; provoked by stress and anxiety   IBS (irritable bowel syndrome)    Intracerebral bleed (HCC)    No aneurysm; followed by Dr. Athena Masse replaced    "lense transplant" 2022; pt states lense don't dilate or constrict   Loss of weight 01/06/2015   Osteoporosis    Stage 3a chronic kidney disease (HCC) 03/16/2021   Stroke (HCC) 1999   hemorrhagic stroke; weakness of left side    Past Surgical History:  Procedure Laterality Date   BIOPSY  04/16/2021   Procedure:  BIOPSY;  Surgeon: Dolores Frame, MD;  Location: [facility];  Service: Gastroenterology;;  small, bowel, esophageal(proximal and distal);   BIOPSY  07/21/2023   Procedure: BIOPSY;  Surgeon: Marguerita Merles, Reuel Boom, MD;  Location: [facility];  Service: Gastroenterology;;   BRAIN SURGERY  1999   to remove blood clot after stroke    CARDIAC CATHETERIZATION  2016   CERVICAL FUSION     CHOLECYSTECTOMY N/A 10/14/2014   Procedure: LAPAROSCOPIC CHOLECYSTECTOMY WITH INTRAOPERATIVE CHOLANGIOGRAM;  Surgeon: Avel Peace, MD;  Location: [facility];  Service: General;  Laterality: N/A;   CHONDROPLASTY Right 07/13/2017   Procedure: CHONDROPLASTY of patella;  Surgeon: Vickki Hearing, MD;  Location: [facility];  Service: Orthopedics;  Laterality: Right;   ESOPHAGEAL DILATION N/A 04/16/2021   Procedure: ESOPHAGEAL DILATION;  Surgeon: Dolores Frame, MD;  Location: [facility];  Service: Gastroenterology;  Laterality: N/A;   ESOPHAGOGASTRODUODENOSCOPY (EGD) WITH PROPOFOL N/A 04/16/2021   Procedure: ESOPHAGOGASTRODUODENOSCOPY (EGD) WITH PROPOFOL;  Surgeon: Dolores Frame, MD;  Location: [facility];  Service: Gastroenterology;  Laterality: N/A;  1:35, pt knows to arrive at 9:45   ESOPHAGOGASTRODUODENOSCOPY (EGD) WITH PROPOFOL N/A 07/21/2023   Procedure: ESOPHAGOGASTRODUODENOSCOPY (EGD) WITH PROPOFOL;  Surgeon: Dolores Frame, MD;  Location: [facility];  Service:  Gastroenterology;  Laterality: N/A;  9:15AM;ASA 1-2   EUS N/A 08/21/2015   Procedure: ESOPHAGEAL ENDOSCOPIC ULTRASOUND (EUS) RADIAL;  Surgeon: [name], MD;  Location: [facility];  Service: Endoscopy;  Laterality: N/A;   KNEE ARTHROSCOPY WITH MEDIAL MENISECTOMY Right 07/13/2017   Procedure: KNEE ARTHROSCOPY WITH PARTIAL MEDIAL MENISECTOMY;  Surgeon: Vickki Hearing, MD;  Location: [facility];  Service: Orthopedics;  Laterality: Right;   LEFT HEART CATHETERIZATION WITH CORONARY ANGIOGRAM N/A  09/23/2011   Procedure: LEFT HEART CATHETERIZATION WITH CORONARY ANGIOGRAM;  Surgeon: Vesta Mixer, MD;  Location: [facility];  Service: Cardiovascular;  Laterality: N/A;   LIPOMA EXCISION Left 11/18/2013   Procedure: EXCISION OF SOFT TISSUE MASS-LEFT THIGH;  Surgeon: Dalia Heading, MD;  Location: [facility];  Service: General;  Laterality: Left;   NASAL SEPTOPLASTY W/ TURBINOPLASTY Bilateral 08/30/2021   Procedure: NASAL SEPTOPLASTY WITH BILATERAL TURBINATE REDUCTION;  Surgeon: Newman Pies, MD;  Location: [facility];  Service: ENT;  Laterality: Bilateral;   POLYPECTOMY  04/16/2021   Procedure: POLYPECTOMY;  Surgeon: Dolores Frame, MD;  [facility]: [facility];  Service: Gastroenterology;;  gastric   POLYPECTOMY  07/21/2023   Procedure: POLYPECTOMY;  Surgeon: Dolores Frame, MD;  Location: [facility];  Service: Gastroenterology;;   RECTOCELE REPAIR     x2   RECTOCELE REPAIR N/A 04/04/2017   Procedure: POSTERIOR REPAIR (RECTOCELE);  Surgeon: Tilda Burrow, MD;  Location: [facility];  Service: Gynecology;  Laterality: N/A;   SUBMUCOSAL LIFTING INJECTION  07/21/2023   Procedure: SUBMUCOSAL LIFTING INJECTION;  Surgeon: Marguerita Merles, Reuel Boom, MD;  Location: [facility];  Service: Gastroenterology;;   TOTAL ABDOMINAL HYSTERECTOMY        Physical Exam 1 Ht 5\' 3"  (1.6 m)   Wt 149 lb (67.6 kg)   BMI 26.39 kg/m   2 The patient is well developed well nourished and well groomed. 3 Orientation to person place and time is normal  4 Mood is pleasant.  Affect is flat  5 Ambulatory status patient in the wheelchair today  6 Inspection of the left wrist reveals mild tenderness   mild swelling no deformity 7 Range of motion assessment: The range of motion is diminished primarily secondary to pain 8 Stability tests are deferred because of pain but the x-ray shows no subluxation of the joint 9 Strength assessment muscle tone is normal resistance testing is  deferred because of pain and swelling  10 Nerve function normal 11 Vascular function normal 12 Local lymphatic system normal  Right foot is bruised on the back or plantar aspect with pain on the dorsal aspect no crepitance no skin area irritation   MEDICAL DECISION MAKING  A.  Encounter Diagnoses  Name Primary?   Other closed extra-articular fracture of distal end of left radius, initial encounter Yes   Pain in right foot     B. DATA ANALYSED:  ER rec good and outside imaging  IMAGING: Independent interpretation of images: Outside imaging reveals transverse fracture no displacement or angulation left wrist  In-house imaging DG Foot Complete Right Result Date: 09/28/2023 Right foot xrays Pain after traumatic dorsiflexion X-ray 3 views no fracture no dislocation Normal right foot     Orders: Hard sole shoe right foot cast left wrist  Outside records reviewed: Yes ER  C. MANAGEMENT nonoperative management cast left wrist hard sole shoe right foot  X-rays out of plaster 6 weeks  No orders of the defined types were placed in this encounter.

## 2023-09-29 ENCOUNTER — Encounter (HOSPITAL_COMMUNITY): Payer: PPO | Admitting: Physical Therapy

## 2023-09-30 ENCOUNTER — Other Ambulatory Visit (HOSPITAL_COMMUNITY): Payer: Self-pay | Admitting: Psychiatry

## 2023-10-02 ENCOUNTER — Other Ambulatory Visit (INDEPENDENT_AMBULATORY_CARE_PROVIDER_SITE_OTHER): Payer: Self-pay | Admitting: *Deleted

## 2023-10-02 DIAGNOSIS — K219 Gastro-esophageal reflux disease without esophagitis: Secondary | ICD-10-CM

## 2023-10-02 MED ORDER — OMEPRAZOLE 40 MG PO CPDR: 40.0000 mg | DELAYED_RELEASE_CAPSULE | Freq: Two times a day (BID) | ORAL | 0 refills | Status: AC

## 2023-10-02 NOTE — Telephone Encounter (Signed)
 Call for appt

## 2023-10-03 ENCOUNTER — Encounter: Payer: Self-pay | Admitting: Internal Medicine

## 2023-10-04 ENCOUNTER — Other Ambulatory Visit: Payer: Self-pay | Admitting: Internal Medicine

## 2023-10-04 NOTE — Telephone Encounter (Signed)
 Scheduled 10/06/23

## 2023-10-05 ENCOUNTER — Other Ambulatory Visit: Payer: Self-pay | Admitting: Internal Medicine

## 2023-10-05 ENCOUNTER — Encounter (INDEPENDENT_AMBULATORY_CARE_PROVIDER_SITE_OTHER): Payer: Self-pay

## 2023-10-05 DIAGNOSIS — M47816 Spondylosis without myelopathy or radiculopathy, lumbar region: Secondary | ICD-10-CM

## 2023-10-05 DIAGNOSIS — S32020S Wedge compression fracture of second lumbar vertebra, sequela: Secondary | ICD-10-CM

## 2023-10-05 MED ORDER — TIZANIDINE HCL 2 MG PO TABS: 2.0000 mg | ORAL_TABLET | Freq: Three times a day (TID) | ORAL | 1 refills | Status: DC | PRN

## 2023-10-06 ENCOUNTER — Telehealth (HOSPITAL_COMMUNITY): Admitting: Psychiatry

## 2023-10-06 ENCOUNTER — Encounter (HOSPITAL_COMMUNITY): Payer: Self-pay | Admitting: Psychiatry

## 2023-10-06 DIAGNOSIS — F332 Major depressive disorder, recurrent severe without psychotic features: Secondary | ICD-10-CM | POA: Diagnosis not present

## 2023-10-06 DIAGNOSIS — R413 Other amnesia: Secondary | ICD-10-CM

## 2023-10-06 MED ORDER — ALPRAZOLAM 1 MG PO TABS: 1.0000 mg | ORAL_TABLET | Freq: Three times a day (TID) | ORAL | 2 refills | Status: DC

## 2023-10-06 MED ORDER — VENLAFAXINE HCL ER 150 MG PO CP24: 150.0000 mg | ORAL_CAPSULE | Freq: Every day | ORAL | 2 refills | Status: DC

## 2023-10-06 MED ORDER — TRAZODONE HCL 100 MG PO TABS: ORAL_TABLET | ORAL | 2 refills | Status: DC

## 2023-10-06 NOTE — Progress Notes (Signed)
 Virtual Visit via Telephone Note  I connected with [name] on 10/06/23 at  9:40 AM EDT by telephone and verified that I am speaking with the correct person using two identifiers.  Location: Patient: home Provider: office   I discussed the limitations, risks, security and privacy concerns of performing an evaluation and management service by telephone and the availability of in person appointments. I also discussed with the patient that there may be a patient responsible charge related to this service. The patient expressed understanding and agreed to proceed.      I discussed the assessment and treatment plan with the patient. The patient was provided an opportunity to ask questions and all were answered. The patient agreed with the plan and demonstrated an understanding of the instructions.   The patient was advised to call back or seek an in-person evaluation if the symptoms worsen or if the condition fails to improve as anticipated.  I provided 20 minutes of non-face-to-face time during this encounter.   [name], MD  [facility] MD/PA/NP OP Progress Note  10/06/2023 9:50 AM [name]  MRN:  [id number]  Chief Complaint:  Chief Complaint  Patient presents with   Anxiety   Follow-up   HPI: T his patient is a 63 year old married white female who lives with her husband in [place]. She has no children. 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  Unfortunately she took a fall in her yard last month and has a left distal radius fracture and she also badly bruised her right foot.  She is kind of immobilized right now but is doing exercises in the hopes of getting better soon.  She states that her spirits have been good and she denies significant depression or anxiety.  She is sleeping very well.  She is a bit frustrated with these recent  injuries but she is working on getting better as quickly as possible.  Visit Diagnosis:    ICD-10-CM   1. Major depressive disorder, recurrent, severe without psychotic features (HCC)  F33.2     2. Memory deficit  R41.3       Past Psychiatric History: Past outpatient treatment for depression  Past Medical History:  Past Medical History:  Diagnosis Date   Allergy    grass, dust , mold   Anxiety    Arthritis    Asthma due to seasonal allergies 06/15/2020   Bipolar disorder (HCC)    Carpal tunnel syndrome    Bilateral   Chest pain 09/2011   Cardiac cath-normal coronaries   Constipation    Depression    Difficulty urinating 05/31/2013   Elevated LFTs 12/16/2013   Encounter for general adult medical examination with abnormal findings 07/07/2021   GERD (gastroesophageal reflux disease)    History of kidney stones    Hyperlipemia    Hyperlipidemia    Hypertension    Mild; provoked by stress and anxiety   IBS (irritable bowel syndrome)    Intracerebral bleed (HCC)    No aneurysm; followed by Dr. Athena Masse replaced    "lense transplant" 2022; pt states lense don't dilate or constrict   Loss of weight 01/06/2015   Osteoporosis    Stage 3a chronic kidney disease (HCC) 03/16/2021   Stroke (HCC) 1999   hemorrhagic stroke; weakness of left side    Past Surgical  History:  Procedure Laterality Date   BIOPSY  04/16/2021   Procedure: BIOPSY;  Surgeon: Dolores Frame, MD;  [facility];  Service: Gastroenterology;;  small, bowel, esophageal(proximal and distal);   BIOPSY  07/21/2023   Procedure: BIOPSY;  Surgeon: Marguerita Merles, Reuel Boom, MD;  [facility];  Service: Gastroenterology;;   BRAIN SURGERY  1999   to remove blood clot after stroke    CARDIAC CATHETERIZATION  2016   CERVICAL FUSION     CHOLECYSTECTOMY N/A 10/14/2014   Procedure: LAPAROSCOPIC CHOLECYSTECTOMY WITH INTRAOPERATIVE CHOLANGIOGRAM;  Surgeon: Avel Peace, MD;   [facility];  Service: General;  Laterality: N/A;   CHONDROPLASTY Right 07/13/2017   Procedure: CHONDROPLASTY of patella;  Surgeon: Vickki Hearing, MD;  Location: [facility];  Service: Orthopedics;  Laterality: Right;   ESOPHAGEAL DILATION N/A 04/16/2021   Procedure: ESOPHAGEAL DILATION;  Surgeon: Dolores Frame, MD;  [facility];  Service: Gastroenterology;  Laterality: N/A;   ESOPHAGOGASTRODUODENOSCOPY (EGD) WITH PROPOFOL N/A 04/16/2021   Procedure: ESOPHAGOGASTRODUODENOSCOPY (EGD) WITH PROPOFOL;  Surgeon: Dolores Frame, MD;  [facility];  Service: Gastroenterology;  Laterality: N/A;  1:35, pt knows to arrive at 9:45   ESOPHAGOGASTRODUODENOSCOPY (EGD) WITH PROPOFOL N/A 07/21/2023   Procedure: ESOPHAGOGASTRODUODENOSCOPY (EGD) WITH PROPOFOL;  Surgeon: Dolores Frame, MD;  [facility];  Service: Gastroenterology;  Laterality: N/A;  9:15AM;ASA 1-2   EUS N/A 08/21/2015   Procedure: ESOPHAGEAL ENDOSCOPIC ULTRASOUND (EUS) RADIAL;  Surgeon: [name], MD;  [facility];  Service: Endoscopy;  Laterality: N/A;   KNEE ARTHROSCOPY WITH MEDIAL MENISECTOMY Right 07/13/2017   Procedure: KNEE ARTHROSCOPY WITH PARTIAL MEDIAL MENISECTOMY;  Surgeon: Vickki Hearing, MD;  [facility]: [facility];  Service: Orthopedics;  Laterality: Right;   LEFT HEART CATHETERIZATION WITH CORONARY ANGIOGRAM N/A 09/23/2011   Procedure: LEFT HEART CATHETERIZATION WITH CORONARY ANGIOGRAM;  Surgeon: Vesta Mixer, MD;  Location: [facility];  Service: Cardiovascular;  Laterality: N/A;   LIPOMA EXCISION Left 11/18/2013   Procedure: EXCISION OF SOFT TISSUE MASS-LEFT THIGH;  Surgeon: Dalia Heading, MD;  Location: [facility];  Service: General;  Laterality: Left;   NASAL SEPTOPLASTY W/ TURBINOPLASTY Bilateral 08/30/2021   Procedure: NASAL SEPTOPLASTY WITH BILATERAL TURBINATE REDUCTION;  Surgeon: Newman Pies, MD;  Location: [facility];  Service:  ENT;  Laterality: Bilateral;   POLYPECTOMY  04/16/2021   Procedure: POLYPECTOMY;  Surgeon: Dolores Frame, MD;  [facility];  Service: Gastroenterology;;  gastric   POLYPECTOMY  07/21/2023   Procedure: POLYPECTOMY;  Surgeon: Dolores Frame, MD;  [facility];  Service: Gastroenterology;;   RECTOCELE REPAIR     x2   RECTOCELE REPAIR N/A 04/04/2017   Procedure: POSTERIOR REPAIR (RECTOCELE);  Surgeon: Tilda Burrow, MD;  [facility]: [facility];  Service: Gynecology;  Laterality: N/A;   SUBMUCOSAL LIFTING INJECTION  07/21/2023   Procedure: SUBMUCOSAL LIFTING INJECTION;  Surgeon: Dolores Frame, MD;  [facility];  Service: Gastroenterology;;   TOTAL ABDOMINAL HYSTERECTOMY      Family Psychiatric History: See below  Family History:  Family History  Problem Relation Age of Onset   Cancer Mother        breast    Hypertension Mother    Hyperlipidemia Mother    Depression Mother    Anxiety disorder Mother    COPD Mother    Arthritis Mother        rheumatoid   Drug  abuse Sister    Coronary artery disease Paternal Grandfather    Coronary artery disease Paternal Uncle    Depression Cousin    Drug abuse Cousin     Social History:  Social History   Socioeconomic History   Marital status: Married    Spouse name: Dorene Sorrow   Number of children: 0   Years of education: HS   Highest education level: Not on file  Occupational History   Occupation: unemployed    Comment: pending disability  Tobacco Use   Smoking status: Former    Current packs/day: 0.00    Average packs/day: 1 pack/day for 19.0 years (19.0 ttl pk-yrs)    Types: Cigarettes    Start date: 09/02/1978    Quit date: 09/01/1997    Years since quitting: 26.1    Passive exposure: Past   Smokeless tobacco: Never   Tobacco comments:    Quit smoking 1999 , previous 20 pack years  Vaping Use   Vaping status: Never Used  Substance and Sexual Activity   Alcohol use:  Yes    Comment: 1 drink every other week   Drug use: No   Sexual activity: Not Currently    Partners: Male    Birth control/protection: Surgical    Comment: hyst   Other Topics Concern   Not on file  Social History Narrative   Currently unable to work   Lives in [place]   Married   Patient drinks 1 cup of caffeine daily.   Patient is right handed.    Joined the [employer] to get more exercise   Social Drivers of Health   Financial Resource Strain: Low Risk  (04/19/2023)   Overall Financial Resource Strain (CARDIA)    Difficulty of Paying Living Expenses: Not hard at all  Food Insecurity: No Food Insecurity (04/19/2023)   Hunger Vital Sign    Worried About Running Out of Food in the Last Year: Never true    Ran Out of Food in the Last Year: Never true  Transportation Needs: No Transportation Needs (04/19/2023)   PRAPARE - Administrator, Civil Service (Medical): No    Lack of Transportation (Non-Medical): No  Physical Activity: Sufficiently Active (04/19/2023)   Exercise Vital Sign    Days of Exercise per Week: 7 days    Minutes of Exercise per Session: 30 min  Stress: No Stress Concern Present (04/19/2023)   [employer] of Occupational Health - Occupational Stress Questionnaire    Feeling of Stress : Only a little  Social Connections: Moderately Isolated (04/19/2023)   Social Connection and Isolation Panel [NHANES]    Frequency of Communication with Friends and Family: Twice a week    Frequency of Social Gatherings with Friends and Family: Once a week    Attends Religious Services: Never    Database administrator or Organizations: No    Attends Banker Meetings: Never    Marital Status: Married    Allergies:  Allergies  Allergen Reactions   Morphine And Codeine Hives   Promethazine Hcl Other (See Comments)    Causes patient to become Hyper    Metabolic Disorder Labs: Lab Results  Component Value Date   HGBA1C 5.5 07/31/2023   MPG 117  (H) 01/06/2015   MPG 117 (H) 02/17/2014   No results found for: "PROLACTIN" Lab Results  Component Value Date   CHOL 151 02/10/2023   TRIG 184 (H) 02/10/2023   HDL 63 02/10/2023   CHOLHDL 2.4 02/10/2023  VLDL 26 09/16/2015   LDLCALC 58 02/10/2023   LDLCALC 52 08/25/2022   Lab Results  Component Value Date   TSH 1.410 02/10/2023   TSH 1.720 08/25/2022    Therapeutic Level Labs: No results found for: "LITHIUM" No results found for: "VALPROATE" No results found for: "CBMZ"  Current Medications: Current Outpatient Medications  Medication Sig Dispense Refill   acetaminophen (TYLENOL 8 HOUR) 650 MG CR tablet Take 650 mg by mouth every 8 (eight) hours.     ALPRAZolam (XANAX) 1 MG tablet Take 1 tablet (1 mg total) by mouth 3 (three) times daily. 90 tablet 2   atorvastatin (LIPITOR) 40 MG tablet TAKE ONE TABLET BY MOUTH ONCE DAILY. 90 tablet 3   Blood Glucose Monitoring Suppl DEVI 1 each by Does not apply route in the morning, at noon, and at bedtime. May substitute to any manufacturer covered by patient's insurance. 1 each 0   Calcium 500-125 MG-UNIT TABS Take 1 tablet by mouth daily with supper.     Carboxymethylcellulose Sod PF 0.5 % SOLN Place 1 drop into both eyes daily as needed (dry eyes).     cetirizine (ZYRTEC) 10 MG tablet Take 10 mg by mouth daily.     diclofenac Sodium (VOLTAREN) 1 % GEL Apply 1 application topically daily as needed (pain).     estradiol (ESTRACE) 0.1 MG/GM vaginal cream Place 0.5 g vaginally 2 (two) times a week. Place 0.5g nightly for two weeks then twice a week after (Patient not taking: Reported on 07/31/2023) 30 g 11   ezetimibe (ZETIA) 10 MG tablet Take 1 tablet (10 mg total) by mouth daily. 90 tablet 3   famotidine (PEPCID) 20 MG tablet Take 20 mg by mouth 2 (two) times daily. As needed     HYDROcodone-acetaminophen (NORCO/VICODIN) 5-325 MG tablet Take 0.5 tablets by mouth every 6 (six) hours as needed for severe pain (pain score 7-10). 10 tablet 0    ipratropium (ATROVENT) 0.03 % nasal spray Place 2 sprays into both nostrils every 12 (twelve) hours. 30 mL 6   lidocaine-prilocaine (EMLA) cream Apply 1 Application topically at bedtime.     lisinopril (ZESTRIL) 20 MG tablet Take 1 tablet (20 mg total) by mouth daily. 90 tablet 1   mirabegron ER (MYRBETRIQ) 50 MG TB24 tablet Take 1 tablet (50 mg total) by mouth daily. 30 tablet 5   montelukast (SINGULAIR) 10 MG tablet Take 1 tablet (10 mg total) by mouth at bedtime. 30 tablet 5   Multiple Vitamin (MULITIVITAMIN WITH MINERALS) TABS Take 1 tablet by mouth daily with breakfast.     Nystatin POWD by Does not apply route. As needed.     Olopatadine HCl 0.2 % SOLN Place 1 drop into both eyes in the morning and at bedtime. (Patient not taking: Reported on 07/31/2023)     Omega-3 Fatty Acids (FISH OIL) 1000 MG CAPS Take 2,000 mg by mouth.     omeprazole (PRILOSEC) 40 MG capsule Take 1 capsule (40 mg total) by mouth 2 (two) times daily. 60 capsule 0   OVER THE COUNTER MEDICATION Milk thistle once per day.  Mushrooms once daily.  Vit K 2 once per day.  Beet root gummies daily     pregabalin (LYRICA) 25 MG capsule Take 1 capsule (25 mg total) by mouth daily. 30 capsule 3   Probiotic Product (TRUBIOTICS PO) Take 1 capsule by mouth daily. Takes 25 cfu daily.     pyridOXINE (VITAMIN B-6) 100 MG tablet Take 100 mg by  mouth daily.     rifaximin (XIFAXAN) 550 MG TABS tablet Take 1 tablet (550 mg total) by mouth 3 (three) times daily. 42 tablet 0   tiZANidine (ZANAFLEX) 2 MG tablet Take 1 tablet (2 mg total) by mouth every 8 (eight) hours as needed for muscle spasms. 90 tablet 1   traMADol-acetaminophen (ULTRACET) 37.5-325 MG tablet Take 1 tablet by mouth every 4 (four) hours as needed. 90 tablet 5   traZODone (DESYREL) 100 MG tablet TAKE (2) TABLETS BY MOUTH AT BEDTIME. 60 tablet 2   TYMLOS 3120 MCG/1.56ML SOPN One qhs     venlafaxine XR (EFFEXOR XR) 150 MG 24 hr capsule Take 1 capsule (150 mg total) by mouth  daily with breakfast. 90 capsule 2   No current facility-administered medications for this visit.     Musculoskeletal: Strength & Muscle Tone: na Gait & Station: na Patient leans: N/A  Psychiatric Specialty Exam: Review of Systems  Musculoskeletal:  Positive for arthralgias and gait problem.  Neurological:  Positive for weakness.  All other systems reviewed and are negative.   There were no vitals taken for this visit.There is no height or weight on file to calculate BMI.  General Appearance: NA  Eye Contact:  NA  Speech:  Clear and Coherent  Volume:  Normal  Mood:  Euthymic  Affect:  NA  Thought Process:  Goal Directed  Orientation:  Full (Time, Place, and Person)  Thought Content: WDL   Suicidal Thoughts:  No  Homicidal Thoughts:  No  Memory:  Immediate;   Good Recent;   Fair Remote;   NA  Judgement:  Good  Insight:  Fair  Psychomotor Activity:  Decreased  Concentration:  Concentration: Good and Attention Span: Good  Recall:  Good  Fund of Knowledge: Good  Language: Good  Akathisia:  No  Handed:  Right  AIMS (if indicated): not done  Assets:  Communication Skills Desire for Improvement Resilience Social Support  ADL's:  Intact  Cognition: WNL  Sleep:  Good   Screenings: AUDIT    Flowsheet Row Clinical Support from 01/06/2021 in [facility]  Alcohol Use Disorder Identification Test Final Score (AUDIT) 4      GAD-7    Flowsheet Row Office Visit from 07/31/2023 in [facility] Office Visit from 08/29/2022 in [facility]  Total GAD-7 Score 0 6      MDI    Flowsheet Row Office Visit from 01/22/2016 in [facility]  Total Score (max 50) 34      Mini-Mental    Flowsheet Row Office Visit from 02/13/2015 in [facility]  Total Score (max 30 points ) 26      PHQ2-9    Flowsheet Row Office Visit from 07/31/2023  in [facility] Clinical Support from 04/19/2023 in [facility] Office Visit from 02/27/2023 in [facility] Office Visit from 11/21/2022 in [facility] Office Visit from 08/29/2022 in [facility]  PHQ-2 Total Score 0 2 0 0 2  PHQ-9 Total Score 0 6 -- -- 5      SBQ-R    Flowsheet Row Office Visit from 01/22/2016 in [facility] [place] Total Score 14.1      Flowsheet Row ED from 09/20/2023 in [facility] Emergency Department at [facility]  Admission (Discharged) from 07/21/2023 in [facility] ED from 02/12/2023 in [facility] Emergency Department at [facility]  C-SSRS RISK CATEGORY No Risk No Risk No Risk        Assessment and Plan: This patient is a 63 year old female with a history of depression anxiety ADD chronic fatigue and chronic pain.  She seems to be doing well on her current regimen.  She will continue Effexor XR 150 mg daily for depression and anxiety, Xanax 1 mg up to 3 times daily for anxiety and trazodone 200 mg at bedtime for sleep.  She will return to see me in 3 months  Collaboration of Care: Collaboration of Care: Primary Care Provider AEB notes are shared with PCP in the epic system  Patient/Guardian was advised Release of Information must be obtained prior to any record release in order to collaborate their care with an outside provider. Patient/Guardian was advised if they have not already done so to contact the registration department to sign all necessary forms in order for [place] to release information regarding their care.   Consent: Patient/Guardian gives verbal consent for treatment and assignment of benefits for services provided during this visit. Patient/Guardian expressed understanding and agreed to proceed.    [name], MD 10/06/2023, 9:50 AM

## 2023-10-07 ENCOUNTER — Other Ambulatory Visit: Payer: Self-pay | Admitting: Internal Medicine

## 2023-10-07 DIAGNOSIS — N3941 Urge incontinence: Secondary | ICD-10-CM

## 2023-10-16 ENCOUNTER — Encounter: Payer: Self-pay | Admitting: Orthopedic Surgery

## 2023-10-20 ENCOUNTER — Encounter: Payer: Self-pay | Admitting: Internal Medicine

## 2023-10-23 ENCOUNTER — Encounter (INDEPENDENT_AMBULATORY_CARE_PROVIDER_SITE_OTHER): Payer: Self-pay

## 2023-10-23 ENCOUNTER — Encounter: Payer: Self-pay | Admitting: Orthopedic Surgery

## 2023-11-09 ENCOUNTER — Encounter: Payer: Self-pay | Admitting: Orthopedic Surgery

## 2023-11-09 ENCOUNTER — Ambulatory Visit (INDEPENDENT_AMBULATORY_CARE_PROVIDER_SITE_OTHER): Payer: Self-pay

## 2023-11-09 ENCOUNTER — Ambulatory Visit: Admitting: Orthopedic Surgery

## 2023-11-09 DIAGNOSIS — S52532D Colles' fracture of left radius, subsequent encounter for closed fracture with routine healing: Secondary | ICD-10-CM | POA: Diagnosis not present

## 2023-11-09 NOTE — Progress Notes (Signed)
   There were no vitals taken for this visit.  There is no height or weight on file to calculate BMI.  Chief Complaint  Patient presents with   Wrist Injury    Left    Foot Pain    Right     Encounter Diagnosis  Name Primary?   Closed Colles' fracture of left radius with routine healing, subsequent encounter 09/20/23 Yes    DOI/DOS/ Date: 09/20/23  Improved

## 2023-11-09 NOTE — Progress Notes (Signed)
   Chief Complaint  Patient presents with   Wrist Injury    Left    Foot Pain    Right     Encounter Diagnosis  Name Primary?   Closed Colles' fracture of left radius with routine healing, subsequent encounter 09/20/23 Yes    DOI/DOS/ Date: 09/20/23  Improved  The patient is improving in terms of her left wrist  She still has some tenderness on the ulnar side Aligned and looks good And is intact  X-rays show fracture is progressing towards healing with no displacement  DG Wrist Complete Left Result Date: 11/09/2023 Left wrist Fracture care follow-up Transverse fracture distal radius nondisplaced Fracture line is resolving She was healing Impression nondisplaced fracture left wrist with progression towards healing    Brace  X-ray in 4 weeks

## 2023-11-14 ENCOUNTER — Encounter: Payer: Self-pay | Admitting: Internal Medicine

## 2023-11-14 NOTE — Telephone Encounter (Signed)
 rifaximin  (XIFAXAN ) 550 MG TABS tablet Take 1 tablet (550 mg total) by mouth 3 (three) times daily for 14 days. Not sure you should just start on this antibiotic, with out the doctor knowing what your symptoms are currently. What symptoms are you having?

## 2023-11-25 ENCOUNTER — Encounter (HOSPITAL_COMMUNITY): Payer: Self-pay | Admitting: *Deleted

## 2023-11-25 ENCOUNTER — Emergency Department (HOSPITAL_COMMUNITY)
Admission: EM | Admit: 2023-11-25 | Discharge: 2023-11-25 | Disposition: A | Discharge status: Home / Self Care | Attending: Emergency Medicine | Admitting: Emergency Medicine

## 2023-11-25 ENCOUNTER — Other Ambulatory Visit: Payer: Self-pay

## 2023-11-25 ENCOUNTER — Emergency Department (HOSPITAL_COMMUNITY)

## 2023-11-25 DIAGNOSIS — S82831A Other fracture of upper and lower end of right fibula, initial encounter for closed fracture: Secondary | ICD-10-CM | POA: Diagnosis not present

## 2023-11-25 DIAGNOSIS — Y92007 Garden or yard of unspecified non-institutional (private) residence as the place of occurrence of the external cause: Secondary | ICD-10-CM | POA: Insufficient documentation

## 2023-11-25 DIAGNOSIS — W010XXA Fall on same level from slipping, tripping and stumbling without subsequent striking against object, initial encounter: Secondary | ICD-10-CM | POA: Diagnosis not present

## 2023-11-25 DIAGNOSIS — S99911A Unspecified injury of right ankle, initial encounter: Secondary | ICD-10-CM | POA: Diagnosis present

## 2023-11-25 DIAGNOSIS — S8264XA Nondisplaced fracture of lateral malleolus of right fibula, initial encounter for closed fracture: Secondary | ICD-10-CM | POA: Insufficient documentation

## 2023-11-25 MED ORDER — HYDROCODONE-ACETAMINOPHEN 5-325 MG PO TABS: 2.0000 | ORAL_TABLET | ORAL | 0 refills | Status: DC | PRN

## 2023-11-25 MED ORDER — HYDROCODONE-ACETAMINOPHEN 5-325 MG PO TABS
2.0000 | ORAL_TABLET | Freq: Once | ORAL | Status: AC
  Administered 2023-11-25: 2 via ORAL
  Filled 2023-11-25: qty 2

## 2023-11-25 NOTE — ED Notes (Signed)
 Pt/family received d/c paperwork at this time. After going over the paperwork any questions, comments, or concerns were answered to the best of this nurse's knowledge. The pt/family verbally acknowledged the teachings/instructions.

## 2023-11-25 NOTE — ED Triage Notes (Signed)
 Pt was planting flowers and lost her balance and fell an hour ago.c/o rt ankle pain + swelling Denies hitting her head with fall. Not able to put any weight on her rt ankle.

## 2023-11-25 NOTE — ED Provider Notes (Signed)
 [place] [facility]ENC[facility] DEPARTMENT AT [facility] Provider Note   CSN: [id number] Arrival date & time: 11/25/23  1634     History  Chief Complaint  Patient presents with   Fall    [name] is a 63 y.o. female who presents today after a trip and fall in her garden where she states she rolled her ankle and now complains of right lateral ankle pain and inability to bear weight on the right leg.  She did not fall forward or hit her head during her fall, did not have any associated loss of consciousness, does not have any further motor weakness as a result of her fall or preceding the fall.  At this time she has no other associated complaints.  Of note several weeks ago this patient had a similar fall with a similar injury.   Fall       Home Medications Prior to Admission medications   Medication Sig Start Date End Date Taking? Authorizing Provider  HYDROcodone -acetaminophen  (NORCO/VICODIN) 5-325 MG tablet Take 2 tablets by mouth every 4 (four) hours as needed. 11/25/23  Yes Juanetta Nordmann, PA  acetaminophen  (TYLENOL  8 HOUR) 650 MG CR tablet Take 650 mg by mouth every 8 (eight) hours.    [provider]  ALPRAZolam  (XANAX ) 1 MG tablet Take 1 tablet (1 mg total) by mouth 3 (three) times daily. 10/06/23   Alysia Bachelor, MD  atorvastatin  (LIPITOR) 40 MG tablet TAKE ONE TABLET BY MOUTH ONCE DAILY. 06/22/23   Meldon Sport, MD  Blood Glucose Monitoring Suppl DEVI 1 each by Does not apply route in the morning, at noon, and at bedtime. May substitute to any manufacturer covered by patient's insurance. 12/12/22   Meldon Sport, MD  Calcium  [id number] MG-UNIT TABS Take 1 tablet by mouth daily with supper.    [provider]  Carboxymethylcellulose Sod PF 0.5 % SOLN Place 1 drop into both eyes daily as needed (dry eyes).    [provider]  cetirizine (ZYRTEC) 10 MG tablet Take 10 mg by mouth daily.    [provider]  diclofenac  Sodium  (VOLTAREN ) 1 % GEL Apply 1 application topically daily as needed (pain).    [provider]  estradiol  (ESTRACE ) 0.1 MG/GM vaginal cream Place 0.5 g vaginally 2 (two) times a week. Place 0.5g nightly for two weeks then twice a week after 07/22/21   Arma Lamp, MD  ezetimibe  (ZETIA ) 10 MG tablet Take 1 tablet (10 mg total) by mouth daily. 05/23/23   Meldon Sport, MD  famotidine (PEPCID) 20 MG tablet Take 20 mg by mouth 2 (two) times daily. As needed    [provider]  ipratropium (ATROVENT ) 0.03 % nasal spray Place 2 sprays into both nostrils every 12 (twelve) hours. 11/23/22   Rochester Chuck, MD  lidocaine -prilocaine  (EMLA ) cream Apply 1 Application topically at bedtime.    [provider]  lisinopril  (ZESTRIL ) 20 MG tablet Take 1 tablet (20 mg total) by mouth daily. 09/25/23   Meldon Sport, MD  montelukast  (SINGULAIR ) 10 MG tablet Take 1 tablet (10 mg total) by mouth at bedtime. 06/26/23   Rochester Chuck, MD  Multiple Vitamin (MULITIVITAMIN WITH MINERALS) TABS Take 1 tablet by mouth daily with breakfast.    [provider]  MYRBETRIQ  50 MG TB24 tablet TAKE ONE TABLET BY MOUTH EVERY DAY 10/09/23   Meldon Sport, MD  Nystatin  POWD by Does not apply route. As needed.  [provider]  Olopatadine HCl 0.2 % SOLN Place 1 drop into both eyes in the morning and at bedtime.    [provider]  Omega-3 Fatty Acids (FISH OIL ) 1000 MG CAPS Take 2,000 mg by mouth.    [provider]  omeprazole  (PRILOSEC) 40 MG capsule Take 1 capsule (40 mg total) by mouth 2 (two) times daily. 10/02/23   Carlan, Chelsea L, NP  OVER THE COUNTER MEDICATION Milk thistle once per day.  Mushrooms once daily.  Vit K 2 once per day.  Beet root gummies daily    [provider]  pregabalin  (LYRICA ) 25 MG capsule Take 1 capsule (25 mg total) by mouth daily. 09/05/23   Meldon Sport, MD  Probiotic Product (TRUBIOTICS PO) Take 1  capsule by mouth daily. Takes 25 cfu daily.    [provider]  pyridOXINE (VITAMIN B-6) 100 MG tablet Take 100 mg by mouth daily.    [provider]  rifaximin  (XIFAXAN ) 550 MG TABS tablet Take 1 tablet (550 mg total) by mouth 3 (three) times daily. 08/17/23   Castaneda Mayorga, Daniel, MD  tiZANidine  (ZANAFLEX ) 2 MG tablet Take 1 tablet (2 mg total) by mouth every 8 (eight) hours as needed for muscle spasms. 10/05/23   Meldon Sport, MD  traMADol -acetaminophen  (ULTRACET ) 37.5-325 MG tablet Take 1 tablet by mouth every 4 (four) hours as needed. 03/23/23   Darrin Emerald, MD  traZODone  (DESYREL ) 100 MG tablet TAKE (2) TABLETS BY MOUTH AT BEDTIME. 10/06/23   Alysia Bachelor, MD  TYMLOS 3120 MCG/1.56ML SOPN One qhs 07/14/22   [provider]  venlafaxine  XR (EFFEXOR  XR) 150 MG 24 hr capsule Take 1 capsule (150 mg total) by mouth daily with breakfast. 10/06/23   Alysia Bachelor, MD      Allergies    Morphine and codeine and Promethazine hcl    Review of Systems   Review of Systems  Musculoskeletal:  Positive for arthralgias and joint swelling.       Pain and swelling in the right ankle    Physical Exam Updated Vital Signs BP (!) 141/70 (BP Location: Right Arm)   Pulse 70   Temp (!) 96.3 F (35.7 C) (Temporal)   Resp 20   Ht 5\' 3"  (1.6 m)   Wt 68 kg   SpO2 92%   BMI 26.57 kg/m  Physical Exam Vitals and nursing note reviewed.  Constitutional:      General: Tanis Fan is not in acute distress.    Appearance: Normal appearance.  HENT:     Head: Normocephalic and atraumatic.     Mouth/Throat:     Mouth: Mucous membranes are moist.     Pharynx: Oropharynx is clear.  Eyes:     Extraocular Movements: Extraocular movements intact.     Conjunctiva/sclera: Conjunctivae normal.     Pupils: Pupils are equal, round, and reactive to light.  Cardiovascular:     Rate and Rhythm: Normal rate and regular rhythm.     Pulses: Normal pulses.          Dorsalis pedis pulses  are 2+ on the right side and 2+ on the left side.       Posterior tibial pulses are 2+ on the right side and 2+ on the left side.     Heart sounds: Normal heart sounds. No murmur heard.    No friction rub. No gallop.  Pulmonary:     Effort: Pulmonary effort is normal.  Breath sounds: Normal breath sounds.  Abdominal:     General: Abdomen is flat. Bowel sounds are normal.     Palpations: Abdomen is soft.  Musculoskeletal:        General: Swelling and tenderness present. No deformity.     Cervical back: Normal range of motion and neck supple.     Right lower leg: No edema.     Left lower leg: No edema.     Right ankle: Swelling present. No deformity, ecchymosis or lacerations. Tenderness present over the ATF ligament. No base of 5th metatarsal tenderness. Decreased range of motion.     Right Achilles Tendon: Normal.     Left ankle: Normal.     Left Achilles Tendon: Normal.     Comments: No tenderness to the midfoot of the right foot, marked edema noted to the lateral right ankle in the area of the anterior talofibular ligament.  Skin:    General: Skin is warm and dry.     Capillary Refill: Capillary refill takes less than 2 seconds.  Neurological:     General: No focal deficit present.     Mental Status: Tanis Fan is alert. Mental status is at baseline.  Psychiatric:        Mood and Affect: Mood normal.     ED Results / Procedures / Treatments   Labs (all labs ordered are listed, but only abnormal results are displayed) Labs Reviewed - No data to display  EKG None  Radiology DG Ankle Complete Right Result Date: 11/25/2023 CLINICAL DATA:  Marvell Slider, right ankle pain and swelling EXAM: RIGHT ANKLE - COMPLETE 3+ VIEW COMPARISON:  09/11/2023 FINDINGS: Frontal, oblique, and lateral views of the right ankle are obtained. There is a nondisplaced transverse fracture through the distal aspect of the lateral malleolus, with marked overlying soft tissue swelling. No other acute fractures. The  ankle mortise is intact. Joint spaces are well preserved. IMPRESSION: 1. Nondisplaced transverse fracture through the lateral malleolus, with marked overlying soft tissue swelling. Electronically Signed   By: Bobbye Burrow M.D.   On: 11/25/2023 17:10    Procedures Procedures    Medications Ordered in ED Medications  HYDROcodone -acetaminophen  (NORCO/VICODIN) 5-325 MG per tablet 2 tablet (has no administration in time range)    ED Course/ Medical Decision Making/ A&P                                 Medical Decision Making Amount and/or Complexity of Data Reviewed Radiology: ordered.  Risk Prescription drug management.   Medical Decision Making:   AISHAH TEFFETELLER is a 63 y.o. female who presented to the ED today with right ankle pain detailed above.     Complete initial physical exam performed, notably the patient  was alert and oriented in no apparent distress.  Physical exam shows a right ankle with marked edema to the right lateral malleolus, there is no tenderness to the midfoot however there is tenderness to the distal right fibula, along with exquisite tenderness to the area of edema.  Distal pulses and motor and sensation are intact. .    Reviewed and confirmed nursing documentation for past medical history, family history, social history.    Initial Assessment:   With the patient's presentation of right ankle pain, most likely diagnosis is ankle sprain. Other diagnoses were considered including (but not limited to) ankle fracture. These are considered less likely due to history of present illness and  physical exam findings.     Initial Plan:  Obtain plain film imaging of the right ankle to assess for acute bony injury. Provide p.o. analgesia as noted to manage her pain. Objective evaluation as below reviewed   Initial Study Results:    Radiology:  All images reviewed independently. Agree with radiology report at this time.   DG Ankle Complete Right Result Date:  11/25/2023 CLINICAL DATA:  Marvell Slider, right ankle pain and swelling EXAM: RIGHT ANKLE - COMPLETE 3+ VIEW COMPARISON:  09/11/2023 FINDINGS: Frontal, oblique, and lateral views of the right ankle are obtained. There is a nondisplaced transverse fracture through the distal aspect of the lateral malleolus, with marked overlying soft tissue swelling. No other acute fractures. The ankle mortise is intact. Joint spaces are well preserved. IMPRESSION: 1. Nondisplaced transverse fracture through the lateral malleolus, with marked overlying soft tissue swelling. Electronically Signed   By: Bobbye Burrow M.D.   On: 11/25/2023 17:10   DG Wrist Complete Left Result Date: 11/09/2023 Left wrist Fracture care follow-up Transverse fracture distal radius nondisplaced Fracture line is resolving She was healing Impression nondisplaced fracture left wrist with progression towards healing   Reassessment and Plan:   Plan to place walking boot on the affected ankle along with outpatient follow-up with orthopedics.  Referrals provided.  She has previous existing relationship with his surgeon.  This has been discussed with the patient and her family and they understand agree with this plan and have no further concerns at this time.  Analgesia has been prescribed as noted.          Final Clinical Impression(s) / ED Diagnoses Final diagnoses:  Closed fracture of distal end of right fibula, unspecified fracture morphology, initial encounter    Rx / DC Orders ED Discharge Orders          Ordered    HYDROcodone -acetaminophen  (NORCO/VICODIN) 5-325 MG tablet  Every 4 hours PRN        11/25/23 1722              Juanetta Nordmann, [place] 11/25/23 1724    Cheyenne Cotta, MD 11/26/23 1147

## 2023-11-25 NOTE — Discharge Instructions (Signed)
 As we discussed, please avoid placing your weight on the affected side, and wear a boot until directed not to do so by your orthopedic surgeon.  On that note, please schedule an appointment as soon as possible with your orthopedic surgeon to have your ankle further assessed.  A prescription for pain medication has been provided and sent to the requested pharmacy.

## 2023-11-26 ENCOUNTER — Encounter: Payer: Self-pay | Admitting: Orthopedic Surgery

## 2023-11-29 ENCOUNTER — Encounter: Payer: Self-pay | Admitting: Internal Medicine

## 2023-11-29 ENCOUNTER — Other Ambulatory Visit: Payer: Self-pay | Admitting: Internal Medicine

## 2023-11-29 DIAGNOSIS — S82831D Other fracture of upper and lower end of right fibula, subsequent encounter for closed fracture with routine healing: Secondary | ICD-10-CM

## 2023-11-29 MED ORDER — HYDROCODONE-ACETAMINOPHEN 5-325 MG PO TABS: 1.0000 | ORAL_TABLET | Freq: Three times a day (TID) | ORAL | 0 refills | Status: DC | PRN

## 2023-12-07 ENCOUNTER — Encounter: Admitting: Orthopedic Surgery

## 2023-12-07 ENCOUNTER — Other Ambulatory Visit (HOSPITAL_COMMUNITY): Payer: Self-pay | Admitting: Sports Medicine

## 2023-12-07 DIAGNOSIS — M810 Age-related osteoporosis without current pathological fracture: Secondary | ICD-10-CM

## 2023-12-11 ENCOUNTER — Telehealth: Payer: Self-pay | Admitting: Orthopedic Surgery

## 2023-12-11 ENCOUNTER — Other Ambulatory Visit (INDEPENDENT_AMBULATORY_CARE_PROVIDER_SITE_OTHER): Payer: Self-pay

## 2023-12-11 ENCOUNTER — Encounter: Payer: Self-pay | Admitting: Orthopedic Surgery

## 2023-12-11 ENCOUNTER — Ambulatory Visit (INDEPENDENT_AMBULATORY_CARE_PROVIDER_SITE_OTHER): Admitting: Orthopedic Surgery

## 2023-12-11 DIAGNOSIS — Y92007 Garden or yard of unspecified non-institutional (private) residence as the place of occurrence of the external cause: Secondary | ICD-10-CM

## 2023-12-11 DIAGNOSIS — S52532D Colles' fracture of left radius, subsequent encounter for closed fracture with routine healing: Secondary | ICD-10-CM | POA: Diagnosis not present

## 2023-12-11 DIAGNOSIS — S8264XA Nondisplaced fracture of lateral malleolus of right fibula, initial encounter for closed fracture: Secondary | ICD-10-CM

## 2023-12-11 DIAGNOSIS — W19XXXA Unspecified fall, initial encounter: Secondary | ICD-10-CM

## 2023-12-11 DIAGNOSIS — M25571 Pain in right ankle and joints of right foot: Secondary | ICD-10-CM

## 2023-12-11 NOTE — Telephone Encounter (Signed)
 Dr. Delfino Fellers pt - at checkout pt is requesting pain medication, would like it sent to [facility].

## 2023-12-11 NOTE — Progress Notes (Signed)
   There were no vitals taken for this visit.  There is no height or weight on file to calculate BMI.  Chief Complaint  Patient presents with   Wrist Injury    09/20/23 left     Encounter Diagnosis  Name Primary?   Closed Colles' fracture of left radius with routine healing, subsequent encounter 09/20/23 Yes    DOI/DOS/ Date: 09/20/23  Unchanged

## 2023-12-11 NOTE — Progress Notes (Signed)
 Follow-up fracture left distal radius  Injury date 09/20/2023 treatment cast followed by bracing  Although she is still having pain at the fracture looks healed on x-ray and it has been almost 12 weeks  Exam findings  Left wrist no swelling or deformity mild dorsal tenderness neurovascular exam intact  Plan progressive weaning from brace and exercises strengthening exercises  Return as needed as far as the wrist goes  However we have a new problem  Pain right ankle Injury May 24  Fell in her yard  Outside imaging was done she was placed in a cam walker  X-ray shows a Weber B transverse fracture distal fibula  Exam findings tender swollen ecchymotic right ankle painful range of motion but she can dorsiflex the foot with assistance to about 5 degrees  Repeat x-ray today 16 days after injury DG Ankle Complete Right Result Date: 12/11/2023 Right ankle distal fibula fracture Follow-up x-ray The x-ray shows a nondisplaced fracture right distal fibula ankle mortise is intact This fracture was compared to previous x-rays done on 24 May   DG Wrist Complete Left Result Date: 12/11/2023 X-ray report x-ray left wrist prior fracture distal radius x-ray shows increased bone density at the site of the fracture, there is a small fracture of the ulnar styloid which is healing Overall alignment looks good there may be a subtle scapholunate widening which is probably chronic Impression most likely fracture healing distal radius and ulnar fracture with initial date of injury on or around September 20, 2023   DG Ankle Complete Right Result Date: 11/25/2023 CLINICAL DATA:  Marvell Slider, right ankle pain and swelling EXAM: RIGHT ANKLE - COMPLETE 3+ VIEW COMPARISON:  09/11/2023 FINDINGS: Frontal, oblique, and lateral views of the right ankle are obtained. There is a nondisplaced transverse fracture through the distal aspect of the lateral malleolus, with marked overlying soft tissue swelling. No other acute fractures.  The ankle mortise is intact. Joint spaces are well preserved. IMPRESSION: 1. Nondisplaced transverse fracture through the lateral malleolus, with marked overlying soft tissue swelling. Electronically Signed   By: Bobbye Burrow M.D.   On: 11/25/2023 17:10     Plan weight-bear as tolerated right ankle with CAM Walker for 4 weeks Use left wrist splint to help with weightbearing  4 weeks xrays right ankle

## 2023-12-13 NOTE — Telephone Encounter (Signed)
Called pt and advised, she verbalized understanding.

## 2023-12-19 ENCOUNTER — Encounter: Payer: Self-pay | Admitting: Internal Medicine

## 2023-12-20 ENCOUNTER — Other Ambulatory Visit: Payer: Self-pay | Admitting: Internal Medicine

## 2023-12-20 DIAGNOSIS — N1831 Chronic kidney disease, stage 3a: Secondary | ICD-10-CM

## 2023-12-20 DIAGNOSIS — E782 Mixed hyperlipidemia: Secondary | ICD-10-CM

## 2023-12-20 DIAGNOSIS — I10 Essential (primary) hypertension: Secondary | ICD-10-CM

## 2023-12-20 DIAGNOSIS — R7303 Prediabetes: Secondary | ICD-10-CM

## 2023-12-22 DIAGNOSIS — N1831 Chronic kidney disease, stage 3a: Secondary | ICD-10-CM | POA: Diagnosis not present

## 2023-12-22 DIAGNOSIS — I10 Essential (primary) hypertension: Secondary | ICD-10-CM | POA: Diagnosis not present

## 2023-12-22 DIAGNOSIS — E782 Mixed hyperlipidemia: Secondary | ICD-10-CM | POA: Diagnosis not present

## 2023-12-23 LAB — CMP14+EGFR
ALT: 28 IU/L (ref 0–32)
AST: 20 IU/L (ref 0–40)
Albumin: 4.6 g/dL (ref 3.9–4.9)
Alkaline Phosphatase: 75 IU/L (ref 44–121)
BUN/Creatinine Ratio: 16 (ref 12–28)
BUN: 19 mg/dL (ref 8–27)
Bilirubin Total: 0.7 mg/dL (ref 0.0–1.2)
CO2: 23 mmol/L (ref 20–29)
Calcium: 9.5 mg/dL (ref 8.7–10.3)
Chloride: 102 mmol/L (ref 96–106)
Creatinine, Ser: 1.16 mg/dL — ABNORMAL HIGH (ref 0.57–1.00)
Globulin, Total: 2.3 g/dL (ref 1.5–4.5)
Glucose: 108 mg/dL — ABNORMAL HIGH (ref 70–99)
Potassium: 4.4 mmol/L (ref 3.5–5.2)
Sodium: 142 mmol/L (ref 134–144)
Total Protein: 6.9 g/dL (ref 6.0–8.5)
eGFR: 54 mL/min/{1.73_m2} — ABNORMAL LOW (ref >59)

## 2023-12-23 LAB — CBC WITH DIFFERENTIAL/PLATELET
Basophils Absolute: 0 10*3/uL (ref 0.0–0.2)
Basos: 1 %
EOS (ABSOLUTE): 0.1 10*3/uL (ref 0.0–0.4)
Eos: 2 %
Hematocrit: 41.8 % (ref 34.0–46.6)
Hemoglobin: 13.5 g/dL (ref 11.1–15.9)
Immature Grans (Abs): 0 10*3/uL (ref 0.0–0.1)
Immature Granulocytes: 0 %
Lymphocytes Absolute: 1.8 10*3/uL (ref 0.7–3.1)
Lymphs: 29 %
MCH: 33 pg (ref 26.6–33.0)
MCHC: 32.3 g/dL (ref 31.5–35.7)
MCV: 102 fL — ABNORMAL HIGH (ref 79–97)
Monocytes Absolute: 0.3 10*3/uL (ref 0.1–0.9)
Monocytes: 5 %
Neutrophils Absolute: 4 10*3/uL (ref 1.4–7.0)
Neutrophils: 63 %
Platelets: 294 10*3/uL (ref 150–450)
RBC: 4.09 x10E6/uL (ref 3.77–5.28)
RDW: 12.2 % (ref 11.7–15.4)
WBC: 6.3 10*3/uL (ref 3.4–10.8)

## 2023-12-23 LAB — VITAMIN D 25 HYDROXY (VIT D DEFICIENCY, FRACTURES): Vit D, 25-Hydroxy: 52.4 ng/mL (ref 30.0–100.0)

## 2023-12-23 LAB — LIPID PANEL
Chol/HDL Ratio: 2.4 ratio (ref 0.0–4.4)
Cholesterol, Total: 124 mg/dL (ref 100–199)
HDL: 51 mg/dL (ref >39)
LDL Chol Calc (NIH): 47 mg/dL (ref 0–99)
Triglycerides: 152 mg/dL — ABNORMAL HIGH (ref 0–149)
VLDL Cholesterol Cal: 26 mg/dL (ref 5–40)

## 2023-12-23 LAB — TSH: TSH: 1.1 u[IU]/mL (ref 0.450–4.500)

## 2023-12-24 ENCOUNTER — Other Ambulatory Visit (HOSPITAL_COMMUNITY): Payer: Self-pay | Admitting: Psychiatry

## 2023-12-28 ENCOUNTER — Other Ambulatory Visit (HOSPITAL_COMMUNITY): Payer: Self-pay | Admitting: Psychiatry

## 2023-12-28 ENCOUNTER — Encounter: Payer: Self-pay | Admitting: Internal Medicine

## 2023-12-30 ENCOUNTER — Other Ambulatory Visit: Payer: Self-pay | Admitting: Internal Medicine

## 2023-12-30 DIAGNOSIS — M47816 Spondylosis without myelopathy or radiculopathy, lumbar region: Secondary | ICD-10-CM

## 2024-01-01 ENCOUNTER — Ambulatory Visit (INDEPENDENT_AMBULATORY_CARE_PROVIDER_SITE_OTHER): Payer: PPO | Admitting: Internal Medicine

## 2024-01-01 ENCOUNTER — Encounter: Payer: Self-pay | Admitting: Internal Medicine

## 2024-01-01 VITALS — BP 104/67 | HR 82 | Ht 63.0 in | Wt 150.0 lb

## 2024-01-01 DIAGNOSIS — R2681 Unsteadiness on feet: Secondary | ICD-10-CM

## 2024-01-01 DIAGNOSIS — F339 Major depressive disorder, recurrent, unspecified: Secondary | ICD-10-CM

## 2024-01-01 DIAGNOSIS — I10 Essential (primary) hypertension: Secondary | ICD-10-CM | POA: Diagnosis not present

## 2024-01-01 DIAGNOSIS — S82831D Other fracture of upper and lower end of right fibula, subsequent encounter for closed fracture with routine healing: Secondary | ICD-10-CM | POA: Diagnosis not present

## 2024-01-01 DIAGNOSIS — G609 Hereditary and idiopathic neuropathy, unspecified: Secondary | ICD-10-CM | POA: Diagnosis not present

## 2024-01-01 DIAGNOSIS — E782 Mixed hyperlipidemia: Secondary | ICD-10-CM

## 2024-01-01 DIAGNOSIS — F419 Anxiety disorder, unspecified: Secondary | ICD-10-CM | POA: Diagnosis not present

## 2024-01-01 DIAGNOSIS — R7303 Prediabetes: Secondary | ICD-10-CM

## 2024-01-01 DIAGNOSIS — N1831 Chronic kidney disease, stage 3a: Secondary | ICD-10-CM | POA: Diagnosis not present

## 2024-01-01 NOTE — Assessment & Plan Note (Signed)
 BP Readings from Last 1 Encounters:  01/01/24 104/67   Well-controlled with lisinopril  20 mg QD Due to low normal BP and fatigue, advised to take half tablet of lisinopril  for now Counseled for compliance with the medications Advised DASH diet and moderate exercise/walking, at least 150 mins/week

## 2024-01-01 NOTE — Assessment & Plan Note (Signed)
Last BMP showed GFR of 54, stable Needs to maintain adequate hydration Avoid nephrotoxic agents including NSAIDs, Dced Nabumetone and Diclofenac On Lisinopril 

## 2024-01-01 NOTE — Progress Notes (Addendum)
 Established Patient Office Visit  Subjective:  Patient ID: ROBINA HAMOR, female    DOB: 1960/08/10  Age: 63 y.o. MRN: [id number]  CC:  Chief Complaint  Patient presents with  . Medical Management of Chronic Issues    5 month f/u    HPI Victoriah L Verdone is a 63 y.o. female with past medical history of HTN, CKD, asthma due to seasonal allergies, h/o hemorrhagic stroke in 1999 (unclear etiology), HLD, anxiety with depression, urinary incontinence and GERD who presents for f/u of her chronic medical conditions.  HTN: Her BP has been wnl at home.  She has been taking lisinopril  20 mg daily again.  Her BP was well controlled today.  She denies any headache, chest pain or palpitations.  She had a fall on 07/29/23 and then in 05/25, likely mechanical due to leg weakness. She has left wrist pain, worse with extension.  She has wrist brace in place.  She is followed by Dr. Orthopedic surgery and going to schedule appt.  Lumbar compression fracture: She had a fall at nighttime at her home while using restroom in 10/23.  She sustained a L2 compression fracture, had a brace and was followed by neurosurgeon.  She has low back pain, for which she takes tramadol  as needed.  Denies any pain upon deep breathing.  She has been continuing moderate activity as tolerated.    Peripheral neuropathy: She has been taking Lyrica  for peripheral neuropathy with better response, but still has numbness and burning sensation of feet.  She had dizzy spells with gabapentin .  Osteoporosis: She is on Tymlos now.   CKD: Her last CMP showed GFR of 54.  Denies any dysuria, hematuria or urinary hesitance or resistance.  She takes oxybutynin  for urinary incontinence.  NASH: Followed by GI.  She has RUQ area pain, which is more likely due to radiating pain from lumbar spine.  Her pain is intermittent, worse with movement and unrelated to food intake.  She has chronic bloating, and was found to have intestinal methanogen  overgrowth.  She was given Xifaxan  for SIBO and/or IBS-D by GI, which she has completed with some improvement in her symptoms.  Chronic fatigue: She still reports chronic fatigue.  She currently takes Concerta  for ADD, followed by psychiatry.  Her TSH, vitamin D  and B12 levels are WNL.  Denies any active bleeding.  She takes Effexor  for MDD and Xanax  for GAD.  Past Medical History:  Diagnosis Date  . Allergy    grass, dust , mold  . Anxiety   . Arthritis   . Asthma due to seasonal allergies 06/15/2020  . Bipolar disorder (HCC)   . Carpal tunnel syndrome    Bilateral  . Chest pain 09/2011   Cardiac cath-normal coronaries  . Constipation   . Depression   . Difficulty urinating 05/31/2013  . Elevated LFTs 12/16/2013  . Encounter for general adult medical examination with abnormal findings 07/07/2021  . GERD (gastroesophageal reflux disease)   . History of kidney stones   . Hyperlipemia   . Hyperlipidemia   . Hypertension    Mild; provoked by stress and anxiety  . IBS (irritable bowel syndrome)   . Intracerebral bleed (HCC)    No aneurysm; followed by Dr. Alix  . Lens replaced    lense transplant 2022; pt states lense don't dilate or constrict  . Loss of weight 01/06/2015  . Osteoporosis   . Stage 3a chronic kidney disease (HCC) 03/16/2021  . Stroke [facility]) 1999  hemorrhagic stroke; weakness of left side    Past Surgical History:  Procedure Laterality Date  . BIOPSY  04/16/2021   Procedure: BIOPSY;  Surgeon: Eartha Flavors, Toribio, MD;  Location: [facility];  Service: Gastroenterology;;  small, bowel, esophageal(proximal and distal);  . BIOPSY  07/21/2023   Procedure: BIOPSY;  Surgeon: Eartha Flavors, Toribio, MD;  Location: [facility];  Service: Gastroenterology;;  . BRAIN SURGERY  1999   to remove blood clot after stroke   . CARDIAC CATHETERIZATION  2016  . CERVICAL FUSION    . CHOLECYSTECTOMY N/A 10/14/2014   Procedure: LAPAROSCOPIC CHOLECYSTECTOMY WITH  INTRAOPERATIVE CHOLANGIOGRAM;  Surgeon: Krystal Russell, MD;  Location: [facility] OR;  Service: General;  Laterality: N/A;  . CHONDROPLASTY Right 07/13/2017   Procedure: CHONDROPLASTY of patella;  Surgeon: Margrette Taft BRAVO, MD;  Location: [facility];  Service: Orthopedics;  Laterality: Right;  . ESOPHAGEAL DILATION N/A 04/16/2021   Procedure: ESOPHAGEAL DILATION;  Surgeon: Eartha Flavors Toribio, MD;  [facility]: [facility];  Service: Gastroenterology;  Laterality: N/A;  . ESOPHAGOGASTRODUODENOSCOPY (EGD) WITH PROPOFOL  N/A 04/16/2021   Procedure: ESOPHAGOGASTRODUODENOSCOPY (EGD) WITH PROPOFOL ;  Surgeon: Eartha Flavors Toribio, MD;  [facility]: [facility];  Service: Gastroenterology;  Laterality: N/A;  1:35, pt knows to arrive at 9:45  . ESOPHAGOGASTRODUODENOSCOPY (EGD) WITH PROPOFOL  N/A 07/21/2023   Procedure: ESOPHAGOGASTRODUODENOSCOPY (EGD) WITH PROPOFOL ;  Surgeon: Eartha Flavors Toribio, MD;  [facility];  Service: Gastroenterology;  Laterality: N/A;  9:15AM;ASA 1-2  . EUS N/A 08/21/2015   Procedure: ESOPHAGEAL ENDOSCOPIC ULTRASOUND (EUS) RADIAL;  Surgeon: Belvie Just, MD;  Location: [facility];  Service: Endoscopy;  Laterality: N/A;  . KNEE ARTHROSCOPY WITH MEDIAL MENISECTOMY Right 07/13/2017   Procedure: KNEE ARTHROSCOPY WITH PARTIAL MEDIAL MENISECTOMY;  Surgeon: Margrette Taft BRAVO, MD;  Location: [facility];  Service: Orthopedics;  Laterality: Right;  . LEFT HEART CATHETERIZATION WITH CORONARY ANGIOGRAM N/A 09/23/2011   Procedure: LEFT HEART CATHETERIZATION WITH CORONARY ANGIOGRAM;  Surgeon: Aleene JINNY Passe, MD;  Location[facility] CATH LAB;  Service: Cardiovascular;  Laterality: N/A;  . LIPOMA EXCISION Left 11/18/2013   Procedure: EXCISION OF SOFT TISSUE MASS-LEFT THIGH;  Surgeon: Oneil DELENA Budge, MD;  Location: [facility];  Service: General;  Laterality: Left;  . NASAL SEPTOPLASTY W/ TURBINOPLASTY Bilateral 08/30/2021   Procedure: NASAL SEPTOPLASTY WITH BILATERAL TURBINATE REDUCTION;   Surgeon: Karis Clunes, MD;  Location: [facility];  Service: ENT;  Laterality: Bilateral;  . POLYPECTOMY  04/16/2021   Procedure: POLYPECTOMY;  Surgeon: Eartha Flavors Toribio, MD;  [facility];  Service: Gastroenterology;;  gastric  . POLYPECTOMY  07/21/2023   Procedure: POLYPECTOMY;  Surgeon: Eartha Flavors Toribio, MD;  [facility];  Service: Gastroenterology;;  . RECTOCELE REPAIR     x2  . RECTOCELE REPAIR N/A 04/04/2017   Procedure: POSTERIOR REPAIR (RECTOCELE);  Surgeon: Edsel Norleen GAILS, MD;  Location: [facility];  Service: Gynecology;  Laterality: N/A;  . SUBMUCOSAL LIFTING INJECTION  07/21/2023   Procedure: SUBMUCOSAL LIFTING INJECTION;  Surgeon: Eartha Flavors Toribio, MD;  [facility];  Service: Gastroenterology;;  . TOTAL ABDOMINAL HYSTERECTOMY      Family History  Problem Relation Age of Onset  . Cancer Mother        breast   . Hypertension Mother   . Hyperlipidemia Mother   . Depression Mother   . Anxiety disorder Mother   . COPD Mother   . Arthritis Mother        rheumatoid  .  Drug abuse Sister   . Coronary artery disease Paternal Grandfather   . Coronary artery disease Paternal Uncle   . Depression Cousin   . Drug abuse Cousin     Social History   Socioeconomic History  . Marital status: Married    Spouse name: Christopher  . Number of children: 0  . Years of education: HS  . Highest education level: Not on file  Occupational History  . Occupation: unemployed    Comment: pending disability  Tobacco Use  . Smoking status: Former    Current packs/day: 0.00    Average packs/day: 1 pack/day for 19.0 years (19.0 ttl pk-yrs)    Types: Cigarettes    Start date: 09/02/1978    Quit date: 09/01/1997    Years since quitting: 26.3    Passive exposure: Past  . Smokeless tobacco: Never  . Tobacco comments:    Quit smoking 1999 , previous 20 pack years  Vaping Use  . Vaping status: Never Used  Substance and Sexual  Activity  . Alcohol use: Yes    Comment: 1 drink every other week  . Drug use: No  . Sexual activity: Not Currently    Partners: Male    Birth control/protection: Surgical    Comment: hyst   Other Topics Concern  . Not on file  Social History Narrative   Currently unable to work   Lives in [place]   Married   Patient drinks 1 cup of caffeine daily.   Patient is right handed.    Joined the [employer] to get more exercise   Social Drivers of Health   Financial Resource Strain: Low Risk  (04/19/2023)   Overall Financial Resource Strain (CARDIA)   . Difficulty of Paying Living Expenses: Not hard at all  Food Insecurity: No Food Insecurity (04/19/2023)   Hunger Vital Sign   . Worried About Programme researcher, broadcasting/film/video in the Last Year: Never true   . Ran Out of Food in the Last Year: Never true  Transportation Needs: No Transportation Needs (04/19/2023)   PRAPARE - Transportation   . Lack of Transportation (Medical): No   . Lack of Transportation (Non-Medical): No  Physical Activity: Sufficiently Active (04/19/2023)   Exercise Vital Sign   . Days of Exercise per Week: 7 days   . Minutes of Exercise per Session: 30 min  Stress: No Stress Concern Present (04/19/2023)   [employer] of Occupational Health - Occupational Stress Questionnaire   . Feeling of Stress : Only a little  Social Connections: Moderately Isolated (04/19/2023)   Social Connection and Isolation Panel   . Frequency of Communication with Friends and Family: Twice a week   . Frequency of Social Gatherings with Friends and Family: Once a week   . Attends Religious Services: Never   . Active Member of Clubs or Organizations: No   . Attends Banker Meetings: Never   . Marital Status: Married  Catering manager Violence: Not At Risk (04/19/2023)   Humiliation, Afraid, Rape, and Kick questionnaire   . Fear of Current or Ex-Partner: No   . Emotionally Abused: No   . Physically Abused: No   . Sexually Abused: No     Outpatient Medications Prior to Visit  Medication Sig Dispense Refill  . acetaminophen  (TYLENOL  8 HOUR) 650 MG CR tablet Take 650 mg by mouth every 8 (eight) hours.    . atorvastatin  (LIPITOR) 40 MG tablet TAKE ONE TABLET BY MOUTH ONCE DAILY. 90 tablet 3  .  Blood Glucose Monitoring Suppl DEVI 1 each by Does not apply route in the morning, at noon, and at bedtime. May substitute to any manufacturer covered by patient's insurance. 1 each 0  . Calcium  500-125 MG-UNIT TABS Take 1 tablet by mouth daily with supper.    . Carboxymethylcellulose Sod PF 0.5 % SOLN Place 1 drop into both eyes daily as needed (dry eyes).    . cetirizine (ZYRTEC) 10 MG tablet Take 10 mg by mouth daily.    . diclofenac  Sodium (VOLTAREN ) 1 % GEL Apply 1 application topically daily as needed (pain).    . estradiol  (ESTRACE ) 0.1 MG/GM vaginal cream Place 0.5 g vaginally 2 (two) times a week. Place 0.5g nightly for two weeks then twice a week after 30 g 11  . ezetimibe  (ZETIA ) 10 MG tablet Take 1 tablet (10 mg total) by mouth daily. 90 tablet 3  . famotidine (PEPCID) 20 MG tablet Take 20 mg by mouth 2 (two) times daily. As needed    . HYDROcodone -acetaminophen  (NORCO/VICODIN) 5-325 MG tablet Take 1 tablet by mouth every 8 (eight) hours as needed. 30 tablet 0  . ipratropium (ATROVENT ) 0.03 % nasal spray Place 2 sprays into both nostrils every 12 (twelve) hours. 30 mL 6  . lidocaine -prilocaine  (EMLA ) cream Apply 1 Application topically at bedtime.    . lisinopril  (ZESTRIL ) 20 MG tablet Take 1 tablet (20 mg total) by mouth daily. 90 tablet 1  . montelukast  (SINGULAIR ) 10 MG tablet Take 1 tablet (10 mg total) by mouth at bedtime. 30 tablet 5  . Multiple Vitamin (MULITIVITAMIN WITH MINERALS) TABS Take 1 tablet by mouth daily with breakfast.    . MYRBETRIQ  50 MG TB24 tablet TAKE ONE TABLET BY MOUTH EVERY DAY 30 tablet 5  . Nystatin  POWD by Does not apply route. As needed.    . Olopatadine HCl 0.2 % SOLN Place 1 drop into both  eyes in the morning and at bedtime.    . Omega-3 Fatty Acids (FISH OIL ) 1000 MG CAPS Take 2,000 mg by mouth.    . omeprazole  (PRILOSEC) 40 MG capsule Take 1 capsule (40 mg total) by mouth 2 (two) times daily. 60 capsule 0  . OVER THE COUNTER MEDICATION Milk thistle once per day.  Mushrooms once daily.  Vit K 2 once per day.  Beet root gummies daily    . pregabalin  (LYRICA ) 25 MG capsule TAKE ONE CAPSULE BY MOUTH EVERY DAY 30 capsule 3  . Probiotic Product (TRUBIOTICS PO) Take 1 capsule by mouth daily. Takes 25 cfu daily.    Vinessa pyridOXINE (VITAMIN B-6) 100 MG tablet Take 100 mg by mouth daily.    . traMADol -acetaminophen  (ULTRACET ) 37.5-325 MG tablet Take 1 tablet by mouth every 4 (four) hours as needed. 90 tablet 5  . TYMLOS 3120 MCG/1.56ML SOPN One qhs    . ALPRAZolam  (XANAX ) 1 MG tablet Take 1 tablet (1 mg total) by mouth 3 (three) times daily. 90 tablet 2  . rifaximin  (XIFAXAN ) 550 MG TABS tablet Take 1 tablet (550 mg total) by mouth 3 (three) times daily. 42 tablet 0  . tiZANidine  (ZANAFLEX ) 2 MG tablet Take 1 tablet (2 mg total) by mouth every 8 (eight) hours as needed for muscle spasms. 90 tablet 1  . traZODone  (DESYREL ) 100 MG tablet TAKE (2) TABLETS BY MOUTH AT BEDTIME. 60 tablet 2  . venlafaxine  XR (EFFEXOR  XR) 150 MG 24 hr capsule Take 1 capsule (150 mg total) by mouth daily with breakfast. 90 capsule 2  No facility-administered medications prior to visit.    Allergies  Allergen Reactions  . Morphine And Codeine Hives  . Promethazine Hcl Other (See Comments)    Causes patient to become Hyper    ROS Review of Systems  Constitutional:  Positive for fatigue. Negative for chills and fever.  HENT:  Negative for congestion, sinus pressure and sinus pain.   Eyes:  Negative for pain and discharge.  Respiratory:  Negative for cough and shortness of breath.   Cardiovascular:  Negative for chest pain and palpitations.  Gastrointestinal:  Positive for constipation. Negative for  diarrhea, nausea and vomiting.  Endocrine: Negative for polydipsia and polyuria.  Genitourinary:  Negative for dysuria and hematuria.  Musculoskeletal:  Positive for arthralgias (Left wrist) and back pain. Negative for neck stiffness.  Skin:  Negative for rash.  Allergic/Immunologic: Positive for environmental allergies.  Neurological:  Negative for dizziness and weakness.  Psychiatric/Behavioral:  Positive for dysphoric mood. Negative for agitation and behavioral problems. The patient is nervous/anxious.       Objective:    Physical Exam Vitals reviewed.  Constitutional:      General: [name] is not in acute distress.    Appearance: [name] is not diaphoretic.  HENT:     Head: Normocephalic and atraumatic.     Nose: Nose normal. No congestion.     Mouth/Throat:     Mouth: Mucous membranes are dry.     Pharynx: No posterior oropharyngeal erythema.  Eyes:     General: No scleral icterus.    Extraocular Movements: Extraocular movements intact.  Cardiovascular:     Rate and Rhythm: Normal rate and regular rhythm.     Heart sounds: Normal heart sounds. No murmur heard. Pulmonary:     Breath sounds: Normal breath sounds. No wheezing or rales.  Musculoskeletal:     Left wrist: Swelling and tenderness present. Decreased range of motion.     Cervical back: Neck supple. No tenderness.     Lumbar back: Tenderness present.     Comments: RLE cam walker boot in place  Skin:    General: Skin is warm.     Findings: No rash.  Neurological:     General: No focal deficit present.     Mental Status: [name] is alert and oriented to person, place, and time.     Comments: Facial droop, chronic  Psychiatric:        Mood and Affect: Mood normal.        Behavior: Behavior normal.     BP 104/67   Pulse 82   Ht 5' 3 (1.6 m)   Wt 150 lb (68 kg)   SpO2 92%   BMI 26.57 kg/m  Wt Readings from Last 3 Encounters:  01/01/24 150 lb (68 kg)  11/25/23 150 lb (68 kg)  09/28/23 149 lb (67.6 kg)     Lab Results  Component Value Date   TSH 1.100 12/22/2023   Lab Results  Component Value Date   WBC 6.3 12/22/2023   HGB 13.5 12/22/2023   HCT 41.8 12/22/2023   MCV 102 (H) 12/22/2023   PLT 294 12/22/2023   Lab Results  Component Value Date   NA 142 12/22/2023   K 4.4 12/22/2023   CO2 23 12/22/2023   GLUCOSE 108 (H) 12/22/2023   BUN 19 12/22/2023   CREATININE 1.16 (H) 12/22/2023   BILITOT 0.7 12/22/2023   ALKPHOS 75 12/22/2023   AST 20 12/22/2023   ALT 28 12/22/2023   PROT 6.9 12/22/2023  ALBUMIN 4.6 12/22/2023   CALCIUM  9.5 12/22/2023   ANIONGAP 8 08/25/2021   EGFR 54 (L) 12/22/2023   Lab Results  Component Value Date   CHOL 124 12/22/2023   Lab Results  Component Value Date   HDL 51 12/22/2023   Lab Results  Component Value Date   LDLCALC 47 12/22/2023   Lab Results  Component Value Date   TRIG 152 (H) 12/22/2023   Lab Results  Component Value Date   CHOLHDL 2.4 12/22/2023   Lab Results  Component Value Date   HGBA1C 5.5 07/31/2023      Assessment & Plan:   Problem List Items Addressed This Visit       Cardiovascular and Mediastinum   Hypertension - Primary   BP Readings from Last 1 Encounters:  01/01/24 104/67   Well-controlled with lisinopril  20 mg QD Due to low normal BP and fatigue, advised to take half tablet of lisinopril  for now Counseled for compliance with the medications Advised DASH diet and moderate exercise/walking, at least 150 mins/week        Nervous and Auditory   Idiopathic peripheral neuropathy   Has numbness and burning pain of feet No h/o DM On Lyrica  25 mg BID, did not tolerate higher dose of Lyrica  or Gabapentin  Referred to Neurology      Relevant Orders   Ambulatory referral to Neurology     Musculoskeletal and Integument   Closed fracture of distal end of right fibula with routine healing   Has cam walker boot in place Followed by orthopedic surgery         Genitourinary   Stage 3a chronic  kidney disease (HCC)   Last BMP showed GFR of 54, stable Needs to maintain adequate hydration Avoid nephrotoxic agents including NSAIDs, Dced Nabumetone and Diclofenac  On Lisinopril       Relevant Orders   CMP14+EGFR   CBC with Differential/Platelet     Other   Anxiety (Chronic)   Follows up with Psychiatrist On Alprazolam  1 mg TID, Effexor  150 mg QD and Trazodone  200 mg qHS      Mixed hyperlipidemia   On Atorvastatin  40 mg QD and Zetia  10 mg QD due to h/o CVA Lipid profile reviewed -LDL at goal now      Recurrent depression   Well-controlled with Trazodone  and Effexor  Follows up with Psychiatrist      Gait instability   Relevant Orders   Ambulatory referral to Neurology   Other Visit Diagnoses       Prediabetes       Relevant Orders   Hemoglobin A1c        No orders of the defined types were placed in this encounter.   Follow-up: Return in about 5 months (around 06/02/2024) for HTN and CKD.    Suzzane MARLA Blanch, MD

## 2024-01-01 NOTE — Patient Instructions (Signed)
 Please start taking Lisinopril  1/2 tablet for now.  Please continue to take medications as prescribed.  Please continue to follow low salt diet and perform moderate exercise/walking as tolerated.

## 2024-01-01 NOTE — Assessment & Plan Note (Signed)
 On Atorvastatin 40 mg QD due to h/o CVA and uncontrolled HLD

## 2024-01-04 NOTE — Assessment & Plan Note (Signed)
 Has cam walker boot in place Followed by orthopedic surgery

## 2024-01-04 NOTE — Assessment & Plan Note (Signed)
 Follows up with Psychiatrist On Alprazolam 1 mg TID, Effexor 150 mg QD and Trazodone 200 mg qHS

## 2024-01-04 NOTE — Assessment & Plan Note (Signed)
Well-controlled with Trazodone and Effexor Follows up with Psychiatrist 

## 2024-01-05 ENCOUNTER — Other Ambulatory Visit: Payer: Self-pay | Admitting: Internal Medicine

## 2024-01-05 DIAGNOSIS — M47816 Spondylosis without myelopathy or radiculopathy, lumbar region: Secondary | ICD-10-CM

## 2024-01-05 DIAGNOSIS — S32020S Wedge compression fracture of second lumbar vertebra, sequela: Secondary | ICD-10-CM

## 2024-01-08 ENCOUNTER — Ambulatory Visit (INDEPENDENT_AMBULATORY_CARE_PROVIDER_SITE_OTHER): Admitting: Orthopedic Surgery

## 2024-01-08 ENCOUNTER — Other Ambulatory Visit (INDEPENDENT_AMBULATORY_CARE_PROVIDER_SITE_OTHER): Payer: Self-pay

## 2024-01-08 ENCOUNTER — Encounter: Payer: Self-pay | Admitting: Orthopedic Surgery

## 2024-01-08 ENCOUNTER — Telehealth (HOSPITAL_COMMUNITY): Admitting: Psychiatry

## 2024-01-08 DIAGNOSIS — S82831D Other fracture of upper and lower end of right fibula, subsequent encounter for closed fracture with routine healing: Secondary | ICD-10-CM

## 2024-01-08 NOTE — Progress Notes (Signed)
  Follow-up fibula fracture right ankle May 24  Chief Complaint  Patient presents with   Ankle Injury    Encounter Diagnosis  Name Primary?   Other closed fracture of distal end of right fibula with routine healing, subsequent encounter 11/25/23 Yes    DOI/DOS/ Date: 11/25/23  Improved Has d/c boot wearing sneaker walking with quad cane   DG Ankle Complete Right Result Date: 01/08/2024 X-ray right ankle Distal fibular fracture Nondisplaced fracture fracture lines visible sclerosis around the bone callus forming nicely no displacement, ankle mortise intact Healing fracture of distal fibula     Exam shows a stable ankle with excellent range of motion  X-ray shows fracture is healing  Recommend return as needed advance activities slowly

## 2024-01-08 NOTE — Progress Notes (Signed)
   There were no vitals taken for this visit.  There is no height or weight on file to calculate BMI.  Chief Complaint  Patient presents with   Ankle Injury    Encounter Diagnosis  Name Primary?   Other closed fracture of distal end of right fibula with routine healing, subsequent encounter 11/25/23 Yes    DOI/DOS/ Date: 11/25/23  Improved Has d/c boot wearing sneaker walking with quad cane

## 2024-01-10 ENCOUNTER — Other Ambulatory Visit (HOSPITAL_COMMUNITY): Payer: Self-pay | Admitting: Adult Health

## 2024-01-10 ENCOUNTER — Telehealth (HOSPITAL_COMMUNITY): Admitting: Psychiatry

## 2024-01-10 ENCOUNTER — Encounter (HOSPITAL_COMMUNITY): Payer: Self-pay | Admitting: Psychiatry

## 2024-01-10 DIAGNOSIS — Z1231 Encounter for screening mammogram for malignant neoplasm of breast: Secondary | ICD-10-CM

## 2024-01-10 DIAGNOSIS — F332 Major depressive disorder, recurrent severe without psychotic features: Secondary | ICD-10-CM | POA: Diagnosis not present

## 2024-01-10 MED ORDER — VENLAFAXINE HCL ER 150 MG PO CP24: 150.0000 mg | ORAL_CAPSULE | Freq: Every day | ORAL | 2 refills | Status: DC

## 2024-01-10 MED ORDER — ALPRAZOLAM 1 MG PO TABS: 1.0000 mg | ORAL_TABLET | Freq: Three times a day (TID) | ORAL | 2 refills | Status: DC

## 2024-01-10 MED ORDER — TRAZODONE HCL 100 MG PO TABS: ORAL_TABLET | ORAL | 2 refills | Status: DC

## 2024-01-10 NOTE — Progress Notes (Signed)
 Virtual Visit via Telephone Note  I connected with Shaneka L Space on 01/10/24 at  1:00 PM EDT by telephone and verified that I am speaking with the correct person using two identifiers.  Location: Patient: home Provider: office   I discussed the limitations, risks, security and privacy concerns of performing an evaluation and management service by telephone and the availability of in person appointments. I also discussed with the patient that there may be a patient responsible charge related to this service. The patient expressed understanding and agreed to proceed.       I discussed the assessment and treatment plan with the patient. The patient was provided an opportunity to ask questions and all were answered. The patient agreed with the plan and demonstrated an understanding of the instructions.   The patient was advised to call back or seek an in-person evaluation if the symptoms worsen or if the condition fails to improve as anticipated.  I provided 20 minutes of non-face-to-face time during this encounter.   Barnie Gull, MD  [facility] MD/PA/NP OP Progress Note  01/10/2024 1:18 PM TWALA COLLINGS  MRN:  [id number]  Chief Complaint:  Chief Complaint  Patient presents with   Depression   Anxiety   Follow-up   HPI:  Visit T his patient is a 63year-old married white female who lives with her husband in [place]. She has no children. 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  Unfortunately she has had more issues with her fractures.  She recently fractured her right foot and left wrist.  She is probably having numerous fractures because of her osteoporosis.  She is on medication for this but she is not sure it is helping anymore.  She is frustrated with all the fractures and inability to work out in her garden that she would like to.  She is  doing the best she can and is going to be going to the gym to try to strengthen her legs on the exercise bike.  Her mood has been stable and she denies significant depression anxiety.  Most of the time she sleeps fairly well.   Diagnosis:    ICD-10-CM   1. Major depressive disorder, recurrent, severe without psychotic features (HCC)  F33.2       Past Psychiatric History: Past outpatient treatment for depression  Past Medical History:  Past Medical History:  Diagnosis Date   Allergy    grass, dust , mold   Anxiety    Arthritis    Asthma due to seasonal allergies 06/15/2020   Bipolar disorder (HCC)    Carpal tunnel syndrome    Bilateral   Chest pain 09/2011   Cardiac cath-normal coronaries   Constipation    Depression    Difficulty urinating 05/31/2013   Elevated LFTs 12/16/2013   Encounter for general adult medical examination with abnormal findings 07/07/2021   GERD (gastroesophageal reflux disease)    History of kidney stones    Hyperlipemia    Hyperlipidemia    Hypertension    Mild; provoked by stress and anxiety   IBS (irritable bowel syndrome)    Intracerebral bleed (HCC)    No aneurysm; followed by Dr. Alix Brisker replaced    lense transplant 2022; pt states lense don't dilate or constrict   Loss of weight 01/06/2015   Osteoporosis    Stage  3a chronic kidney disease (HCC) 03/16/2021   Stroke (HCC) 1999   hemorrhagic stroke; weakness of left side    Past Surgical History:  Procedure Laterality Date   BIOPSY  04/16/2021   Procedure: BIOPSY;  Surgeon: Eartha Angelia Sieving, MD;  [facility];  Service: Gastroenterology;;  small, bowel, esophageal(proximal and distal);   BIOPSY  07/21/2023   Procedure: BIOPSY;  Surgeon: Eartha Angelia, Sieving, MD;  [facility];  Service: Gastroenterology;;   BRAIN SURGERY  1999   to remove blood clot after stroke    CARDIAC CATHETERIZATION  2016   CERVICAL FUSION     CHOLECYSTECTOMY N/A  10/14/2014   Procedure: LAPAROSCOPIC CHOLECYSTECTOMY WITH INTRAOPERATIVE CHOLANGIOGRAM;  Surgeon: Krystal Russell, MD;  Location: [facility] OR;  Service: General;  Laterality: N/A;   CHONDROPLASTY Right 07/13/2017   Procedure: CHONDROPLASTY of patella;  Surgeon: Margrette Taft BRAVO, MD;  Location: [facility];  Service: Orthopedics;  Laterality: Right;   ESOPHAGEAL DILATION N/A 04/16/2021   Procedure: ESOPHAGEAL DILATION;  Surgeon: Eartha Angelia Sieving, MD;  [facility];  Service: Gastroenterology;  Laterality: N/A;   ESOPHAGOGASTRODUODENOSCOPY (EGD) WITH PROPOFOL  N/A 04/16/2021   Procedure: ESOPHAGOGASTRODUODENOSCOPY (EGD) WITH PROPOFOL ;  Surgeon: Eartha Angelia Sieving, MD;  [facility];  Service: Gastroenterology;  Laterality: N/A;  1:35, pt knows to arrive at 9:45   ESOPHAGOGASTRODUODENOSCOPY (EGD) WITH PROPOFOL  N/A 07/21/2023   Procedure: ESOPHAGOGASTRODUODENOSCOPY (EGD) WITH PROPOFOL ;  Surgeon: Eartha Angelia Sieving, MD;  [facility];  Service: Gastroenterology;  Laterality: N/A;  9:15AM;ASA 1-2   EUS N/A 08/21/2015   Procedure: ESOPHAGEAL ENDOSCOPIC ULTRASOUND (EUS) RADIAL;  Surgeon: Belvie Just, MD;  [facility]: [facility];  Service: Endoscopy;  Laterality: N/A;   KNEE ARTHROSCOPY WITH MEDIAL MENISECTOMY Right 07/13/2017   Procedure: KNEE ARTHROSCOPY WITH PARTIAL MEDIAL MENISECTOMY;  Surgeon: Margrette Taft BRAVO, MD;  Location: [facility];  Service: Orthopedics;  Laterality: Right;   LEFT HEART CATHETERIZATION WITH CORONARY ANGIOGRAM N/A 09/23/2011   Procedure: LEFT HEART CATHETERIZATION WITH CORONARY ANGIOGRAM;  Surgeon: Aleene JINNY Passe, MD;  Location: [facility];  Service: Cardiovascular;  Laterality: N/A;   LIPOMA EXCISION Left 11/18/2013   Procedure: EXCISION OF SOFT TISSUE MASS-LEFT THIGH;  Surgeon: Oneil DELENA Budge, MD;  Location: [facility];  Service: General;  Laterality: Left;   NASAL SEPTOPLASTY W/ TURBINOPLASTY Bilateral 08/30/2021   Procedure: NASAL  SEPTOPLASTY WITH BILATERAL TURBINATE REDUCTION;  Surgeon: Karis Clunes, MD;  Location: [facility];  Service: ENT;  Laterality: Bilateral;   POLYPECTOMY  04/16/2021   Procedure: POLYPECTOMY;  Surgeon: Eartha Angelia Sieving, MD;  [facility];  Service: Gastroenterology;;  gastric   POLYPECTOMY  07/21/2023   Procedure: POLYPECTOMY;  Surgeon: Eartha Angelia Sieving, MD;  [facility];  Service: Gastroenterology;;   RECTOCELE REPAIR     x2   RECTOCELE REPAIR N/A 04/04/2017   Procedure: POSTERIOR REPAIR (RECTOCELE);  Surgeon: Edsel Norleen GAILS, MD;  Location: [facility];  Service: Gynecology;  Laterality: N/A;   SUBMUCOSAL LIFTING INJECTION  07/21/2023   Procedure: SUBMUCOSAL LIFTING INJECTION;  Surgeon: Eartha Angelia Sieving, MD;  [facility];  Service: Gastroenterology;;   TOTAL ABDOMINAL HYSTERECTOMY      Family Psychiatric History: See below  Family History:  Family History  Problem Relation Age of Onset   Cancer Mother        breast    Hypertension Mother    Hyperlipidemia Mother    Depression Mother  Anxiety disorder Mother    COPD Mother    Arthritis Mother        rheumatoid   Drug abuse Sister    Coronary artery disease Paternal Grandfather    Coronary artery disease Paternal Uncle    Depression Cousin    Drug abuse Cousin     Social History:  Social History   Socioeconomic History   Marital status: Married    Spouse name: Christopher   Number of children: 0   Years of education: HS   Highest education level: Not on file  Occupational History   Occupation: unemployed    Comment: pending disability  Tobacco Use   Smoking status: Former    Current packs/day: 0.00    Average packs/day: 1 pack/day for 19.0 years (19.0 ttl pk-yrs)    Types: Cigarettes    Start date: 09/02/1978    Quit date: 09/01/1997    Years since quitting: 26.3    Passive exposure: Past   Smokeless tobacco: Never   Tobacco comments:    Quit smoking  1999 , previous 20 pack years  Vaping Use   Vaping status: Never Used  Substance and Sexual Activity   Alcohol use: Yes    Comment: 1 drink every other week   Drug use: No   Sexual activity: Not Currently    Partners: Male    Birth control/protection: Surgical    Comment: hyst   Other Topics Concern   Not on file  Social History Narrative   Currently unable to work   Lives in [place]   Married   Patient drinks 1 cup of caffeine daily.   Patient is right handed.    [employer]ined the [employer] to get more exercise   Social Drivers of Health   Financial Resource Strain: Low Risk  (04/19/2023)   Overall Financial Resource Strain (CARDIA)    Difficulty of Paying Living Expenses: Not hard at all  Food Insecurity: No Food Insecurity (04/19/2023)   Hunger Vital Sign    Worried About Running Out of Food in the Last Year: Never true    Ran Out of Food in the Last Year: Never true  Transportation Needs: No Transportation Needs (04/19/2023)   PRAPARE - Administrator, Civil Service (Medical): No    Lack of Transportation (Non-Medical): No  Physical Activity: Sufficiently Active (04/19/2023)   Exercise Vital Sign    Days of Exercise per Week: 7 days    Minutes of Exercise per Session: 30 min  Stress: No Stress Concern Present (04/19/2023)   [employer] of Occupational Health - Occupational Stress Questionnaire    Feeling of Stress : Only a little  Social Connections: Moderately Isolated (04/19/2023)   Social Connection and Isolation Panel    Frequency of Communication with Friends and Family: Twice a week    Frequency of Social Gatherings with Friends and Family: Once a week    Attends Religious Services: Never    Database administrator or Organizations: No    Attends Banker Meetings: Never    Marital Status: Married    Allergies:  Allergies  Allergen Reactions   Morphine And Codeine Hives   Promethazine Hcl Other (See Comments)    Causes patient to  become Hyper    Metabolic Disorder Labs: Lab Results  Component Value Date   HGBA1C 5.5 07/31/2023   MPG 117 (H) 01/06/2015   MPG 117 (H) 02/17/2014   No results found for: PROLACTIN Lab Results  Component  Value Date   CHOL 124 12/22/2023   TRIG 152 (H) 12/22/2023   HDL 51 12/22/2023   CHOLHDL 2.4 12/22/2023   VLDL 26 09/16/2015   LDLCALC 47 12/22/2023   LDLCALC 58 02/10/2023   Lab Results  Component Value Date   TSH 1.100 12/22/2023   TSH 1.410 02/10/2023    Therapeutic Level Labs: No results found for: LITHIUM No results found for: VALPROATE No results found for: CBMZ  Current Medications: Current Outpatient Medications  Medication Sig Dispense Refill   acetaminophen  (TYLENOL  8 HOUR) 650 MG CR tablet Take 650 mg by mouth every 8 (eight) hours.     ALPRAZolam  (XANAX ) 1 MG tablet Take 1 tablet (1 mg total) by mouth 3 (three) times daily. 90 tablet 2   atorvastatin  (LIPITOR) 40 MG tablet TAKE ONE TABLET BY MOUTH ONCE DAILY. 90 tablet 3   Blood Glucose Monitoring Suppl DEVI 1 each by Does not apply route in the morning, at noon, and at bedtime. May substitute to any manufacturer covered by patient's insurance. 1 each 0   Calcium  500-125 MG-UNIT TABS Take 1 tablet by mouth daily with supper.     Carboxymethylcellulose Sod PF 0.5 % SOLN Place 1 drop into both eyes daily as needed (dry eyes).     cetirizine (ZYRTEC) 10 MG tablet Take 10 mg by mouth daily.     diclofenac  Sodium (VOLTAREN ) 1 % GEL Apply 1 application topically daily as needed (pain).     estradiol  (ESTRACE ) 0.1 MG/GM vaginal cream Place 0.5 g vaginally 2 (two) times a week. Place 0.5g nightly for two weeks then twice a week after 30 g 11   ezetimibe  (ZETIA ) 10 MG tablet Take 1 tablet (10 mg total) by mouth daily. 90 tablet 3   famotidine (PEPCID) 20 MG tablet Take 20 mg by mouth 2 (two) times daily. As needed     HYDROcodone -acetaminophen  (NORCO/VICODIN) 5-325 MG tablet Take 1 tablet by mouth every 8  (eight) hours as needed. 30 tablet 0   ipratropium (ATROVENT ) 0.03 % nasal spray Place 2 sprays into both nostrils every 12 (twelve) hours. 30 mL 6   lidocaine -prilocaine  (EMLA ) cream Apply 1 Application topically at bedtime.     lisinopril  (ZESTRIL ) 20 MG tablet Take 1 tablet (20 mg total) by mouth daily. 90 tablet 1   montelukast  (SINGULAIR ) 10 MG tablet Take 1 tablet (10 mg total) by mouth at bedtime. 30 tablet 5   Multiple Vitamin (MULITIVITAMIN WITH MINERALS) TABS Take 1 tablet by mouth daily with breakfast.     MYRBETRIQ  50 MG TB24 tablet TAKE ONE TABLET BY MOUTH EVERY DAY 30 tablet 5   Nystatin  POWD by Does not apply route. As needed.     Olopatadine HCl 0.2 % SOLN Place 1 drop into both eyes in the morning and at bedtime.     Omega-3 Fatty Acids (FISH OIL ) 1000 MG CAPS Take 2,000 mg by mouth.     omeprazole  (PRILOSEC) 40 MG capsule Take 1 capsule (40 mg total) by mouth 2 (two) times daily. 60 capsule 0   OVER THE COUNTER MEDICATION Milk thistle once per day.  Mushrooms once daily.  Vit K 2 once per day.  Beet root gummies daily     pregabalin  (LYRICA ) 25 MG capsule TAKE ONE CAPSULE BY MOUTH EVERY DAY 30 capsule 3   Probiotic Product (TRUBIOTICS PO) Take 1 capsule by mouth daily. Takes 25 cfu daily.     pyridOXINE (VITAMIN B-6) 100 MG tablet Take 100 mg  by mouth daily.     tiZANidine  (ZANAFLEX ) 2 MG tablet TAKE ONE TABLET BY MOUTH EVERY 8 HOURS AS NEEDED FOR MUSCLE SPASMS 90 tablet 1   traMADol -acetaminophen  (ULTRACET ) 37.5-325 MG tablet Take 1 tablet by mouth every 4 (four) hours as needed. 90 tablet 5   traZODone  (DESYREL ) 100 MG tablet TAKE (2) TABLETS BY MOUTH AT BEDTIME. 60 tablet 2   TYMLOS 3120 MCG/1.56ML SOPN One qhs     venlafaxine  XR (EFFEXOR  XR) 150 MG 24 hr capsule Take 1 capsule (150 mg total) by mouth daily with breakfast. 90 capsule 2   No current facility-administered medications for this visit.     Musculoskeletal: Strength & Muscle Tone: na Gait & Station:  na Patient leans: N/A  Psychiatric Specialty Exam: Review of Systems  Musculoskeletal:  Positive for arthralgias, back pain, gait problem and joint swelling.  All other systems reviewed and are negative.   There were no vitals taken for this visit.There is no height or weight on file to calculate BMI.  General Appearance: NA  Eye Contact:  NA  Speech:  Clear and Coherent  Volume:  Normal  Mood:  Euthymic  Affect:  NA  Thought Process:  Goal Directed  Orientation:  Full (Time, Place, and Person)  Thought Content: Rumination   Suicidal Thoughts:  No  Homicidal Thoughts:  No  Memory:  Immediate;   Good Recent;   Good Remote;   Fair  Judgement:  Good  Insight:  Good  Psychomotor Activity:  Decreased  Concentration:  Concentration: Fair and Attention Span: Fair  Recall:  Good  Fund of Knowledge: Good  Language: Good  Akathisia:  No  Handed:  Right  AIMS (if indicated): not done  Assets:  Communication Skills Desire for Improvement Physical Health Resilience Social Support Talents/Skills  ADL's:  Intact  Cognition: WNL  Sleep:  Good   Screenings: AUDIT    Flowsheet Row Clinical Support from 01/06/2021 in [facility]  Alcohol Use Disorder Identification Test Final Score (AUDIT) 4   GAD-7    Flowsheet Row Office Visit from 01/01/2024 in [facility] Office Visit from 07/31/2023 in [facility] Office Visit from 08/29/2022 in [facility]  Total GAD-7 Score 0 0 6   MDI    Flowsheet Row Office Visit from 01/22/2016 in [facility]  Total Score (max 50) 34   Mini-Mental    Flowsheet Row Office Visit from 02/13/2015 in [facility]  Total Score (max 30 points ) 26   PHQ2-9    Flowsheet Row Office Visit from 01/01/2024 in [facility] Office Visit from 07/31/2023 in [facility]  Primary Care Clinical Support from 04/19/2023 in [facility] Office Visit from 02/27/2023 in [facility] Office Visit from 11/21/2022 in [facility]  PHQ-2 Total Score 0 0 2 0 0  PHQ-9 Total Score 0 0 6 -- --   SBQ-R    Flowsheet Row Office Visit from 01/22/2016 in [facility] Total Score 14.1   Flowsheet Row ED from 11/25/2023 in [facility] Emergency Department at [facility] ED from 09/20/2023 in [facility] Emergency Department at [facility] Admission (Discharged) from 07/21/2023 in [facility]  C-SSRS RISK CATEGORY No Risk No Risk No Risk  Assessment and Plan: This patient is a 63 year old female with a history of depression anxiety ADD chronic fatigue and chronic pain.  In terms of her mental illness she seems to be doing well on her current regimen.  She will continue Effexor  XR 150 mg daily for depression and anxiety, Xanax  1 mg up to 3 times daily for anxiety and trazodone  200 mg at bedtime for sleep.  She will return to see me in 3 months  Collaboration of Care: Collaboration of Care: Primary Care Provider AEB notes are shared with PCP in the epic system  Patient/Guardian was advised Release of Information must be obtained prior to any record release in order to collaborate their care with an outside provider. Patient/Guardian was advised if they have not already done so to contact the registration department to sign all necessary forms in order for us  to release information regarding their care.   Consent: Patient/Guardian gives verbal consent for treatment and assignment of benefits for services provided during this visit. Patient/Guardian expressed understanding and agreed to proceed.    Barnie Gull, MD 01/10/2024, 1:18 PM

## 2024-01-11 ENCOUNTER — Other Ambulatory Visit: Payer: Self-pay | Admitting: Adult Health

## 2024-01-11 MED ORDER — NYSTATIN 100000 UNIT/GM EX POWD
1.0000 | Freq: Three times a day (TID) | CUTANEOUS | 1 refills | Status: AC
Start: 2024-01-11 — End: ?

## 2024-01-11 NOTE — Progress Notes (Signed)
Rx nystatin powder.  

## 2024-01-12 ENCOUNTER — Other Ambulatory Visit: Payer: Self-pay | Admitting: Internal Medicine

## 2024-01-12 DIAGNOSIS — G609 Hereditary and idiopathic neuropathy, unspecified: Secondary | ICD-10-CM | POA: Insufficient documentation

## 2024-01-12 NOTE — Assessment & Plan Note (Signed)
 Has numbness and burning pain of feet No h/o DM On Lyrica  25 mg BID, did not tolerate higher dose of Lyrica  or Gabapentin  Referred to Neurology

## 2024-01-12 NOTE — Addendum Note (Signed)
 Addended byBETHA TOBIE DOWNS on: 01/12/2024 08:36 AM   Modules accepted: Orders

## 2024-01-16 ENCOUNTER — Encounter: Payer: Self-pay | Admitting: Orthopedic Surgery

## 2024-01-16 DIAGNOSIS — S32010S Wedge compression fracture of first lumbar vertebra, sequela: Secondary | ICD-10-CM

## 2024-01-16 DIAGNOSIS — M21372 Foot drop, left foot: Secondary | ICD-10-CM

## 2024-01-16 DIAGNOSIS — S32020A Wedge compression fracture of second lumbar vertebra, initial encounter for closed fracture: Secondary | ICD-10-CM

## 2024-01-30 ENCOUNTER — Encounter: Payer: Self-pay | Admitting: Orthopedic Surgery

## 2024-02-05 ENCOUNTER — Encounter (INDEPENDENT_AMBULATORY_CARE_PROVIDER_SITE_OTHER): Payer: Self-pay

## 2024-02-09 ENCOUNTER — Other Ambulatory Visit: Payer: Self-pay | Admitting: Internal Medicine

## 2024-02-09 DIAGNOSIS — L0882 Omphalitis not of newborn: Secondary | ICD-10-CM

## 2024-02-09 MED ORDER — CLOTRIMAZOLE 1 % EX CREA: 1.0000 | TOPICAL_CREAM | Freq: Two times a day (BID) | CUTANEOUS | 0 refills | Status: AC

## 2024-02-10 ENCOUNTER — Encounter (INDEPENDENT_AMBULATORY_CARE_PROVIDER_SITE_OTHER): Payer: Self-pay | Admitting: Gastroenterology

## 2024-02-14 ENCOUNTER — Encounter: Payer: Self-pay | Admitting: Orthopedic Surgery

## 2024-02-14 DIAGNOSIS — H04123 Dry eye syndrome of bilateral lacrimal glands: Secondary | ICD-10-CM | POA: Diagnosis not present

## 2024-02-14 DIAGNOSIS — Z961 Presence of intraocular lens: Secondary | ICD-10-CM | POA: Diagnosis not present

## 2024-02-14 DIAGNOSIS — H43812 Vitreous degeneration, left eye: Secondary | ICD-10-CM | POA: Diagnosis not present

## 2024-02-14 DIAGNOSIS — H35432 Paving stone degeneration of retina, left eye: Secondary | ICD-10-CM | POA: Diagnosis not present

## 2024-02-15 ENCOUNTER — Other Ambulatory Visit: Payer: Self-pay | Admitting: Adult Health

## 2024-02-15 MED ORDER — ESTRADIOL 0.1 MG/GM VA CREA: TOPICAL_CREAM | VAGINAL | 1 refills | Status: AC

## 2024-02-15 NOTE — Progress Notes (Signed)
 Refilled estrace  vaginal cream

## 2024-02-16 ENCOUNTER — Ambulatory Visit (HOSPITAL_COMMUNITY)
Admission: RE | Admit: 2024-02-16 | Discharge: 2024-02-16 | Disposition: A | Discharge status: Home / Self Care | Source: Ambulatory Visit | Attending: Adult Health | Admitting: Adult Health

## 2024-02-16 DIAGNOSIS — Z1231 Encounter for screening mammogram for malignant neoplasm of breast: Secondary | ICD-10-CM | POA: Insufficient documentation

## 2024-02-18 ENCOUNTER — Encounter: Payer: Self-pay | Admitting: Internal Medicine

## 2024-02-19 ENCOUNTER — Encounter: Admitting: Orthopedic Surgery

## 2024-02-20 ENCOUNTER — Encounter: Payer: Self-pay | Admitting: Orthopedic Surgery

## 2024-02-20 ENCOUNTER — Ambulatory Visit: Payer: Self-pay | Admitting: Adult Health

## 2024-02-22 ENCOUNTER — Ambulatory Visit (INDEPENDENT_AMBULATORY_CARE_PROVIDER_SITE_OTHER): Admitting: Gastroenterology

## 2024-02-22 ENCOUNTER — Encounter (INDEPENDENT_AMBULATORY_CARE_PROVIDER_SITE_OTHER): Payer: Self-pay | Admitting: Gastroenterology

## 2024-02-22 VITALS — BP 88/58 | HR 67 | Temp 97.1°F | Ht 63.0 in | Wt 153.5 lb

## 2024-02-22 DIAGNOSIS — R140 Abdominal distension (gaseous): Secondary | ICD-10-CM

## 2024-02-22 DIAGNOSIS — R1319 Other dysphagia: Secondary | ICD-10-CM | POA: Diagnosis not present

## 2024-02-22 DIAGNOSIS — K638219 Small intestinal bacterial overgrowth, unspecified: Secondary | ICD-10-CM

## 2024-02-22 MED ORDER — RIFAXIMIN 550 MG PO TABS: 550.0000 mg | ORAL_TABLET | Freq: Three times a day (TID) | ORAL | 0 refills | Status: AC

## 2024-02-22 NOTE — Patient Instructions (Addendum)
 Xifaxan  550 mg three times a day Referral to [facility] gastroenterology for Endoflip testing

## 2024-02-22 NOTE — Progress Notes (Signed)
 Toribio Fortune, M.D. Gastroenterology & [facility] Gastroenterology [address] [place], [place] [address]  Primary Care Physician: [name], MD [address] [place] [address]  I will communicate my assessment and recommendations to the referring MD via EMR.  Problems: History of SIBO and SIMO, recurrent bloating IBS-M NASH History of fundic gland polyp with low-grade dysplasia  recurrent dysphagia, unclear if related to achalasia versus EGJOO  History of Present Illness: [name] is a 63 y.o. female with past medical history of bipolar disorder, cervical cancer, depression, hyperlipidemia, hypertension, IBS, stroke, SIMO and SIBO, CKD stage III and NASH, who presents for follow up of bloating.  The patient was last seen on 08/29/2018 for. At that time, the patient was ordered to have SIBO breath test and advised to continue Benefiber daily.  Also advised to titrate MiraLAX  for constipation.  Patient has required previous courses of Xifaxan  for recurrent SIBO.  Patient reports that since August 9th, she has presented recurrent episodes of bloating in the RUQ. She also reports that she has presented some lower abdominal pain.  Very occasionally has episodes of diarrhea, and does take Imodium or Peptobismol - this is very occasional.  Husband is in the room, who tries to explain to her the testing as she has issues understanding the different options of treatment of recurrent SIBO versus repeat breath testing.  She has intermittent choking episodes. States having this for the last 10 years. Was referred to [facility] for EGD with Endoflip but she never proceeded with this.  Is interested in having this checked.  The patient denies having any nausea, vomiting, fever, chills, hematochezia, melena, hematemesis,  diarrhea, jaundice, pruritus or weight loss.  Last EGD: 07/21/2023 - Normal esophagus. - A single gastric polyp.  Resected and retrieved via EMR. - A single gastric polyp. Resected and retrieved. - Normal stomach othwerwise. Biopsied. - Normal examined duodenum.  Pathology showed recurrent fundic gland polyp with low-grade dysplasia which was resected.  Recommend a repeat endoscopy in 1 year.  Last Colonoscopy: 08/2018 -performed by Dr. Belvie Just.  Per report within normal limits, had biopsies of the right and left colon which were within normal limits. Last EUS 2017 - The pancreatic parenchyma was normal as well as the PD caliber. The PD was very small and difficult to measure. The CBD was normal in size (4.3 mm) and there was no evidence of any stones. No gallbladder was visualized, which is expected in her post cholecystectomy state. No other abnormalities were identified.  Past Medical History: Past Medical History:  Diagnosis Date   Allergy    grass, dust , mold   Anxiety    Arthritis    Asthma due to seasonal allergies 06/15/2020   Bipolar disorder (HCC)    Carpal tunnel syndrome    Bilateral   Chest pain 09/2011   Cardiac cath-normal coronaries   Constipation    Depression    Difficulty urinating 05/31/2013   Elevated LFTs 12/16/2013   Encounter for general adult medical examination with abnormal findings 07/07/2021   GERD (gastroesophageal reflux disease)    History of kidney stones    Hyperlipemia    Hyperlipidemia    Hypertension    Mild; provoked by stress and anxiety   IBS (irritable bowel syndrome)    Intracerebral bleed (HCC)    No aneurysm; followed by Dr. Alix Brisker replaced    lense transplant 2022; pt states lense don't dilate or  constrict   Loss of weight 01/06/2015   Osteoporosis    Stage 3a chronic kidney disease (HCC) 03/16/2021   Stroke (HCC) 1999   hemorrhagic stroke; weakness of left side    Past Surgical History: Past Surgical History:  Procedure Laterality Date   BIOPSY  04/16/2021   Procedure: BIOPSY;  Surgeon: Eartha Angelia Sieving, MD;   Location: [facility];  Service: Gastroenterology;;  small, bowel, esophageal(proximal and distal);   BIOPSY  07/21/2023   Procedure: BIOPSY;  Surgeon: Eartha Angelia, Sieving, MD;  Location: [facility];  Service: Gastroenterology;;   BRAIN SURGERY  1999   to remove blood clot after stroke    CARDIAC CATHETERIZATION  2016   CERVICAL FUSION     CHOLECYSTECTOMY N/A 10/14/2014   Procedure: LAPAROSCOPIC CHOLECYSTECTOMY WITH INTRAOPERATIVE CHOLANGIOGRAM;  Surgeon: Krystal Russell, MD;  Location: [facility] OR;  Service: General;  Laterality: N/A;   CHONDROPLASTY Right 07/13/2017   Procedure: CHONDROPLASTY of patella;  Surgeon: Margrette Taft BRAVO, MD;  Location: [facility];  Service: Orthopedics;  Laterality: Right;   ESOPHAGEAL DILATION N/A 04/16/2021   Procedure: ESOPHAGEAL DILATION;  Surgeon: Eartha Angelia Sieving, MD;  [facility]: [facility];  Service: Gastroenterology;  Laterality: N/A;   ESOPHAGOGASTRODUODENOSCOPY (EGD) WITH PROPOFOL  N/A 04/16/2021   Procedure: ESOPHAGOGASTRODUODENOSCOPY (EGD) WITH PROPOFOL ;  Surgeon: Eartha Angelia Sieving, MD;  [facility]: [facility];  Service: Gastroenterology;  Laterality: N/A;  1:35, pt knows to arrive at 9:45   ESOPHAGOGASTRODUODENOSCOPY (EGD) WITH PROPOFOL  N/A 07/21/2023   Procedure: ESOPHAGOGASTRODUODENOSCOPY (EGD) WITH PROPOFOL ;  Surgeon: Eartha Angelia Sieving, MD;  [facility];  Service: Gastroenterology;  Laterality: N/A;  9:15AM;ASA 1-2   EUS N/A 08/21/2015   Procedure: ESOPHAGEAL ENDOSCOPIC ULTRASOUND (EUS) RADIAL;  Surgeon: Belvie Just, MD;  Location: [facility];  Service: Endoscopy;  Laterality: N/A;   KNEE ARTHROSCOPY WITH MEDIAL MENISECTOMY Right 07/13/2017   Procedure: KNEE ARTHROSCOPY WITH PARTIAL MEDIAL MENISECTOMY;  Surgeon: Margrette Taft BRAVO, MD;  Location: [facility];  Service: Orthopedics;  Laterality: Right;   LEFT HEART CATHETERIZATION WITH CORONARY ANGIOGRAM N/A 09/23/2011   Procedure: LEFT HEART CATHETERIZATION  WITH CORONARY ANGIOGRAM;  Surgeon: Aleene JINNY Passe, MD;  Location: [facility];  Service: Cardiovascular;  Laterality: N/A;   LIPOMA EXCISION Left 11/18/2013   Procedure: EXCISION OF SOFT TISSUE MASS-LEFT THIGH;  Surgeon: Oneil DELENA Budge, MD;  Location: [facility];  Service: General;  Laterality: Left;   NASAL SEPTOPLASTY W/ TURBINOPLASTY Bilateral 08/30/2021   Procedure: NASAL SEPTOPLASTY WITH BILATERAL TURBINATE REDUCTION;  Surgeon: Karis Clunes, MD;  Location: [facility];  Service: ENT;  Laterality: Bilateral;   POLYPECTOMY  04/16/2021   Procedure: POLYPECTOMY;  Surgeon: Eartha Angelia Sieving, MD;  [facility];  Service: Gastroenterology;;  gastric   POLYPECTOMY  07/21/2023   Procedure: POLYPECTOMY;  Surgeon: Eartha Angelia Sieving, MD;  [facility];  Service: Gastroenterology;;   RECTOCELE REPAIR     x2   RECTOCELE REPAIR N/A 04/04/2017   Procedure: POSTERIOR REPAIR (RECTOCELE);  Surgeon: Edsel Norleen GAILS, MD;  Location: [facility];  Service: Gynecology;  Laterality: N/A;   SUBMUCOSAL LIFTING INJECTION  07/21/2023   Procedure: SUBMUCOSAL LIFTING INJECTION;  Surgeon: Eartha Angelia Sieving, MD;  [facility]: [facility];  Service: Gastroenterology;;   TOTAL ABDOMINAL HYSTERECTOMY      Family History: Family History  Problem Relation Age of Onset   Cancer Mother        breast    Hypertension Mother  Hyperlipidemia Mother    Depression Mother    Anxiety disorder Mother    COPD Mother    Arthritis Mother        rheumatoid   Drug abuse Sister    Coronary artery disease Paternal Grandfather    Coronary artery disease Paternal Uncle    Depression Cousin    Drug abuse Cousin     Social History: Social History   Tobacco Use  Smoking Status Former   Current packs/day: 0.00   Average packs/day: 1 pack/day for 19.0 years (19.0 ttl pk-yrs)   Types: Cigarettes   Start date: 09/02/1978   Quit date: 09/01/1997   Years since quitting: 26.4   Passive  exposure: Past  Smokeless Tobacco Never  Tobacco Comments   Quit smoking 1999 , previous 20 pack years   Social History   Substance and Sexual Activity  Alcohol Use Yes   Comment: 1 drink every other week   Social History   Substance and Sexual Activity  Drug Use No    Allergies: Allergies  Allergen Reactions   Morphine And Codeine Hives   Promethazine Hcl Other (See Comments)    Causes patient to become Hyper    Medications: Current Outpatient Medications  Medication Sig Dispense Refill   acetaminophen  (TYLENOL  8 HOUR) 650 MG CR tablet Take 650 mg by mouth every 8 (eight) hours.     ALPRAZolam  (XANAX ) 1 MG tablet Take 1 tablet (1 mg total) by mouth 3 (three) times daily. 90 tablet 2   atorvastatin  (LIPITOR) 40 MG tablet TAKE ONE TABLET BY MOUTH ONCE DAILY. 90 tablet 3   Blood Glucose Monitoring Suppl DEVI 1 each by Does not apply route in the morning, at noon, and at bedtime. May substitute to any manufacturer covered by patient's insurance. 1 each 0   Calcium  500-125 MG-UNIT TABS Take 1 tablet by mouth daily with supper.     Carboxymethylcellulose Sod PF 0.5 % SOLN Place 1 drop into both eyes daily as needed (dry eyes).     cetirizine (ZYRTEC) 10 MG tablet Take 10 mg by mouth daily.     clotrimazole  (LOTRIMIN  AF) 1 % cream Apply 1 Application topically 2 (two) times daily. (Patient not taking: Reported on 02/22/2024) 30 g 0   diclofenac  Sodium (VOLTAREN ) 1 % GEL Apply 1 application topically daily as needed (pain).     estradiol  (ESTRACE ) 0.1 MG/GM vaginal cream Use 0.5 gm in vagina at bedtime 2 x weekly 42.5 g 1   ezetimibe  (ZETIA ) 10 MG tablet Take 1 tablet (10 mg total) by mouth daily. 90 tablet 3   famotidine (PEPCID) 20 MG tablet Take 20 mg by mouth 2 (two) times daily. As needed     HYDROcodone -acetaminophen  (NORCO/VICODIN) 5-325 MG tablet Take 1 tablet by mouth every 8 (eight) hours as needed. 30 tablet 0   ipratropium (ATROVENT ) 0.03 % nasal spray Place 2 sprays into  both nostrils every 12 (twelve) hours. 30 mL 6   lidocaine -prilocaine  (EMLA ) cream Apply 1 Application topically at bedtime.     lisinopril  (ZESTRIL ) 20 MG tablet Take 1 tablet (20 mg total) by mouth daily. (Patient taking differently: Take 10 mg by mouth daily.) 90 tablet 1   montelukast  (SINGULAIR ) 10 MG tablet Take 1 tablet (10 mg total) by mouth at bedtime. 30 tablet 5   Multiple Vitamin (MULITIVITAMIN WITH MINERALS) TABS Take 1 tablet by mouth daily with breakfast.     MYRBETRIQ  50 MG TB24 tablet TAKE ONE TABLET BY MOUTH EVERY  DAY 30 tablet 5   nystatin  (MYCOSTATIN /NYSTOP ) powder Apply 1 Application topically 3 (three) times daily. 60 g 1   Nystatin  POWD by Does not apply route. As needed.     Olopatadine HCl 0.2 % SOLN Place 1 drop into both eyes in the morning and at bedtime.     Omega-3 Fatty Acids (FISH OIL ) 1000 MG CAPS Take 2,000 mg by mouth.     OVER THE COUNTER MEDICATION Milk thistle once per day.  Mushrooms once daily.  Vit K 2 once per day.  Beet root gummies daily     pregabalin  (LYRICA ) 25 MG capsule TAKE ONE CAPSULE BY MOUTH EVERY DAY 30 capsule 3   Probiotic Product (TRUBIOTICS PO) Take 1 capsule by mouth daily. Takes 25 cfu daily.     pyridOXINE (VITAMIN B-6) 100 MG tablet Take 100 mg by mouth daily.     tiZANidine  (ZANAFLEX ) 2 MG tablet TAKE ONE TABLET BY MOUTH EVERY 8 HOURS AS NEEDED FOR MUSCLE SPASMS 90 tablet 1   traMADol -acetaminophen  (ULTRACET ) 37.5-325 MG tablet Take 1 tablet by mouth every 4 (four) hours as needed. 90 tablet 5   traZODone  (DESYREL ) 100 MG tablet TAKE (2) TABLETS BY MOUTH AT BEDTIME. 60 tablet 2   TYMLOS 3120 MCG/1.56ML SOPN One qhs     venlafaxine  XR (EFFEXOR  XR) 150 MG 24 hr capsule Take 1 capsule (150 mg total) by mouth daily with breakfast. 90 capsule 2   omeprazole  (PRILOSEC) 40 MG capsule Take 1 capsule (40 mg total) by mouth 2 (two) times daily. 60 capsule 0   No current facility-administered medications for this visit.    Review of  Systems: GENERAL: negative for malaise, night sweats HEENT: No changes in hearing or vision, no nose bleeds or other nasal problems. NECK: Negative for lumps, goiter, pain and significant neck swelling RESPIRATORY: Negative for cough, wheezing CARDIOVASCULAR: Negative for chest pain, leg swelling, palpitations, orthopnea GI: SEE HPI MUSCULOSKELETAL: Negative for joint pain or swelling, back pain, and muscle pain. SKIN: Negative for lesions, rash PSYCH: Negative for sleep disturbance, mood disorder and recent psychosocial stressors. HEMATOLOGY Negative for prolonged bleeding, bruising easily, and swollen nodes. ENDOCRINE: Negative for cold or heat intolerance, polyuria, polydipsia and goiter. NEURO: negative for tremor, gait imbalance, syncope and seizures. The remainder of the review of systems is noncontributory.   Physical Exam: BP (!) 88/58 (BP Location: Right Arm, Patient Position: Sitting)   Pulse 67   Temp (!) 97.1 F (36.2 C) (Temporal)   Ht 5' 3 (1.6 m)   Wt 153 lb 8 oz (69.6 kg)   BMI 27.19 kg/m  GENERAL: The patient is AO x3, in no acute distress. HEENT: Head is normocephalic and atraumatic. EOMI are intact. Mouth is well hydrated and without lesions. NECK: Supple. No masses LUNGS: Clear to auscultation. No presence of rhonchi/wheezing/rales. Adequate chest expansion HEART: RRR, normal s1 and s2. ABDOMEN: Soft, nontender, no guarding, no peritoneal signs, and nondistended. BS +. No masses. EXTREMITIES: Without any cyanosis, clubbing, rash, lesions or edema. NEUROLOGIC: AOx3, no focal motor deficit. SKIN: no jaundice, no rashes  Imaging/Labs: as above  I personally reviewed and interpreted the available labs, imaging and endoscopic files.  Impression and Plan: [name] is a 63 y.o. female with past medical history of bipolar disorder, cervical cancer, depression, hyperlipidemia, hypertension, IBS, stroke, SIMO and SIBO, CKD stage III and NASH, who presents  for follow up of bloating.  Patient had a recent onset of recurrent bloating and abdominal discomfort.  This appears to be similar to her previous episodes of SIBO/SIMO.  I discussed with the patient and her husband in detail the possible options at this moment which include a repeat course of Xifaxan  for 2 weeks versus retesting with a breath test.  Patient had a hard time understanding the next steps but her husband was supportive in terms of explaining her how to proceed with.  Will proceed with Xifaxan  550 mg 3 times a day for 2 weeks, but if symptoms persist we will need to check for SIMO.  Finally, she has been to recurrent issues with dysphagia which has been previously evaluated with an esophageal manometry.  Unclear if her dysphagia issues are related to achalasia versus EGJOO.  She will be referred back to [facility] for Endoflip testing and evaluation by esophageal expert.  -Xifaxan  550 mg three times a day -Referral to [facility] gastroenterology for Endoflip testing  All questions were answered.      Toribio Fortune, MD Gastroenterology and Hepatology [facility] Gastroenterology

## 2024-02-26 DIAGNOSIS — M810 Age-related osteoporosis without current pathological fracture: Secondary | ICD-10-CM | POA: Diagnosis not present

## 2024-03-01 ENCOUNTER — Ambulatory Visit: Admitting: Allergy & Immunology

## 2024-03-01 ENCOUNTER — Other Ambulatory Visit: Payer: Self-pay

## 2024-03-01 ENCOUNTER — Encounter (INDEPENDENT_AMBULATORY_CARE_PROVIDER_SITE_OTHER): Payer: Self-pay

## 2024-03-01 VITALS — BP 108/70 | HR 73 | Temp 98.0°F | Resp 18 | Ht 63.0 in | Wt 152.4 lb

## 2024-03-01 DIAGNOSIS — J302 Other seasonal allergic rhinitis: Secondary | ICD-10-CM | POA: Diagnosis not present

## 2024-03-01 DIAGNOSIS — J324 Chronic pansinusitis: Secondary | ICD-10-CM | POA: Diagnosis not present

## 2024-03-01 DIAGNOSIS — J3089 Other allergic rhinitis: Secondary | ICD-10-CM | POA: Diagnosis not present

## 2024-03-01 DIAGNOSIS — K219 Gastro-esophageal reflux disease without esophagitis: Secondary | ICD-10-CM | POA: Diagnosis not present

## 2024-03-01 NOTE — Patient Instructions (Addendum)
 Seasonal and perennial allergic rhinitis (ragweed, indoor and outdoor molds, cat, dog) - Stop the nasal spray since this is not working well. - We will send you back to see Dr. Karis for an evaluation. - Continue with Allegra and Flonase  daily since this seems to be doing the trick. - Consider allergy shots for long-term control.  - We do not allow folks to give allergy shots at home at this point.    Reflux - Continue omeprazole  once a day as needed.  - Continue Tums as needed.   Return in about 6 months (around 08/31/2024). You can have the follow up appointment with Dr. Iva or a Nurse Practicioner (our Nurse Practitioners are excellent and always have Physician oversight!).    Please inform us  of any Emergency Department visits, hospitalizations, or changes in symptoms. Call us  before going to the ED for breathing or allergy symptoms since we might be able to fit you in for a sick visit. Feel free to contact us  anytime with any questions, problems, or concerns.  It was a pleasure to see you again today!  Websites that have reliable patient information: 1. American Academy of Asthma, Allergy, and Immunology: www.aaaai.org 2. Food Allergy Research and Education (FARE): foodallergy.org 3. Mothers of Asthmatics: http://www.asthmacommunitynetwork.org 4. American College of Allergy, Asthma, and Immunology: www.acaai.org      "Like" us  on Facebook and Instagram for our latest updates!      A healthy democracy works best when [employer] participate! Make sure you are registered to vote! If you have moved or changed any of your contact information, you will need to get this updated before voting! Scan the QR codes below to learn more!       Allergy Shots  Allergies are the result of a chain reaction that starts in the immune system. Your immune system controls how your body defends itself. For instance, if you have an allergy to pollen, your immune system identifies pollen as an  invader or allergen. Your immune system overreacts by producing antibodies called Immunoglobulin E (IgE). These antibodies travel to cells that release chemicals, causing an allergic reaction.  The concept behind allergy immunotherapy, whether it is received in the form of shots or tablets, is that the immune system can be desensitized to specific allergens that trigger allergy symptoms. Although it requires time and patience, the payback can be long-term relief. Allergy injections contain a dilute solution of those substances that you are allergic to based upon your skin testing and allergy history.   How Do Allergy Shots Work?  Allergy shots work much like a vaccine. Your body responds to injected amounts of a particular allergen given in increasing doses, eventually developing a resistance and tolerance to it. Allergy shots can lead to decreased, minimal or no allergy symptoms.  There generally are two phases: build-up and maintenance. Build-up often ranges from three to six months and involves receiving injections with increasing amounts of the allergens. The shots are typically given once or twice a week, though more rapid build-up schedules are sometimes used.  The maintenance phase begins when the most effective dose is reached. This dose is different for each person, depending on how allergic you are and your response to the build-up injections. Once the maintenance dose is reached, there are longer periods between injections, typically two to four weeks.  Occasionally doctors give cortisone-type shots that can temporarily reduce allergy symptoms. These types of shots are different and should not be confused with allergy immunotherapy shots.  Who Can Be Treated with Allergy Shots?  Allergy shots may be a good treatment approach for people with allergic rhinitis (hay fever), allergic asthma, conjunctivitis (eye allergy) or stinging insect allergy.   Before deciding to begin allergy shots,  you should consider:   The length of allergy season and the severity of your symptoms  Whether medications and/or changes to your environment can control your symptoms  Your desire to avoid long-term medication use  Time: allergy immunotherapy requires a major time commitment  Cost: may vary depending on your insurance coverage  Allergy shots for children age 61 and older are effective and often well tolerated. They might prevent the onset of new allergen sensitivities or the progression to asthma.  Allergy shots are not started on patients who are pregnant but can be continued on patients who become pregnant while receiving them. In some patients with other medical conditions or who take certain common medications, allergy shots may be of risk. It is important to mention other medications you talk to your allergist.   What are the two types of build-ups offered:   RUSH or Rapid Desensitization -- one day of injections lasting from 8:30-4:30pm, injections every 1 hour.  Approximately half of the build-up process is completed in that one day.  The following week, normal build-up is resumed, and this entails ~16 visits either weekly or twice weekly, until reaching your "maintenance dose" which is continued weekly until eventually getting spaced out to every month for a duration of 3 to 5 years. The regular build-up appointments are nurse visits where the injections are administered, followed by required monitoring for 30 minutes.    Traditional build-up -- weekly visits for 6 -12 months until reaching "maintenance dose", then continue weekly until eventually spacing out to every 4 weeks as above. At these appointments, the injections are administered, followed by required monitoring for 30 minutes.     Either way is acceptable, and both are equally effective. With the rush protocol, the advantage is that less time is spent here for injections overall AND you would also reach maintenance dosing  faster (which is when the clinical benefit starts to become more apparent). Not everyone is a candidate for rapid desensitization.   IF we proceed with the RUSH protocol, there are premedications which must be taken the day before and the day after the rush only (this includes antihistamines, steroids, and Singulair ).  After the rush day, no prednisone  or Singulair  is required, and we just recommend antihistamines taken on your injection day.  What Is An Estimate of the Costs?  If you are interested in starting allergy injections, please check with your insurance company about your coverage for both allergy vial sets and allergy injections.  Please do so prior to making the appointment to start injections.  The following are CPT codes to give to your insurance company. These are the amounts we BILL to the insurance company, but the amount YOU WILL PAY and WE RECEIVE IS SUBSTANTIALLY LESS and depends on the contracts we have with different insurance companies.   Amount Billed to Insurance Two allergy vial set  CPT 95165   $ 2400     Two injections   CPT 95117   $ 40 RUSH (Rapid Desensitization) CPT 95180 x 8 hours  $500/hour  Regarding the allergy injections, your co-pay may or may not apply with each injection, so please confirm this with your insurance company. When you start allergy injections, 1 or 2 sets of vials  are made based on your allergies.  Not all patients can be on one set of vials. A set of vials lasts 6 months to a year depending on how quickly you can proceed with your build-up of your allergy injections. Vials are personalized for each patient depending on their specific allergens.  How often are allergy injection given during the build-up period?   Injections are given at least weekly during the build-up period until your maintenance dose is achieved. Per the doctor's discretion, you may have the option of getting allergy injections two times per week during the build-up period.  However, there must be at least 48 hours between injections. The build-up period is usually completed within 6-12 months depending on your ability to schedule injections and for adjustments for reactions. When maintenance dose is reached, your injection schedule is gradually changed to every two weeks and later to every three weeks. Injections will then continue every 4 weeks. Usually, injections are continued for a total of 3-5 years.   When Will I Feel Better?  Some may experience decreased allergy symptoms during the build-up phase. For others, it may take as long as 12 months on the maintenance dose. If there is no improvement after a year of maintenance, your allergist will discuss other treatment options with you.  If you aren't responding to allergy shots, it may be because there is not enough dose of the allergen in your vaccine or there are missing allergens that were not identified during your allergy testing. Other reasons could be that there are high levels of the allergen in your environment or major exposure to non-allergic triggers like tobacco smoke.  What Is the Length of Treatment?  Once the maintenance dose is reached, allergy shots are generally continued for three to five years. The decision to stop should be discussed with your allergist at that time. Some people may experience a permanent reduction of allergy symptoms. Others may relapse and a longer course of allergy shots can be considered.  What Are the Possible Reactions?  The two types of adverse reactions that can occur with allergy shots are local and systemic. Common local reactions include very mild redness and swelling at the injection site, which can happen immediately or several hours after. Report a delayed reaction from your last injection. These include arm swelling or runny nose, watery eyes or cough that occurs within 12-24 hours after injection. A systemic reaction, which is less common, affects the entire body  or a particular body system. They are usually mild and typically respond quickly to medications. Signs include increased allergy symptoms such as sneezing, a stuffy nose or hives.   Rarely, a serious systemic reaction called anaphylaxis can develop. Symptoms include swelling in the throat, wheezing, a feeling of tightness in the chest, nausea or dizziness. Most serious systemic reactions develop within 30 minutes of allergy shots. This is why it is strongly recommended you wait in your doctor's office for 30 minutes after your injections. Your allergist is trained to watch for reactions, and his or her staff is trained and equipped with the proper medications to identify and treat them.   Report to the nurse immediately if you experience any of the following symptoms: swelling, itching or redness of the skin, hives, watery eyes/nose, breathing difficulty, excessive sneezing, coughing, stomach pain, diarrhea, or light headedness. These symptoms may occur within 15-20 minutes after injection and may require medication.   Who Should Administer Allergy Shots?  The preferred location for receiving shots  is your prescribing allergist's office. Injections can sometimes be given at another facility where the physician and staff are trained to recognize and treat reactions, and have received instructions by your prescribing allergist.  What if I am late for an injection?   Injection dose will be adjusted depending upon how many days or weeks you are late for your injection.   What if I am sick?   Please report any illness to the nurse before receiving injections. She may adjust your dose or postpone injections depending on your symptoms. If you have fever, flu, sinus infection or chest congestion it is best to postpone allergy injections until you are better. Never get an allergy injection if your asthma is causing you problems. If your symptoms persist, seek out medical care to get your health problem under  control.  What If I am or Become Pregnant:  Women that become pregnant should schedule an appointment with [facility] before receiving any further allergy injections.

## 2024-03-01 NOTE — Progress Notes (Signed)
 FOLLOW UP  Date of Service/Encounter:  03/01/24   Assessment:   Cough - never improved with asthma medication, likely secondary to postnasal drip   Seasonal and perennial allergic rhinitis (ragweed, indoor molds, outdoor molds, cat and dog)   Complicated past medical history, including a stroke as well as anxiety and depression   Osteoporosis - with recent multiple fractures in the last 12 months  Adverse reaction to Singulair    Plan/Recommendations:   Seasonal and perennial allergic rhinitis (ragweed, indoor and outdoor molds, cat, dog) - Stop the nasal spray since this is not working well. - We will send you back to see Dr. Karis for an evaluation. - We will revisit allergy shots after a re-evaluation from Dr. Karis.  - Continue with Allegra and Flonase  daily since this seems to be doing the trick. - Consider allergy shots for long-term control.  - We do not allow folks to give allergy shots at home.    Reflux - Continue omeprazole  once a day as needed.  - Continue Tums as needed.   Return in about 6 months (around 08/31/2024). You can have the follow up appointment with Dr. Iva or a Nurse Practicioner (our Nurse Practitioners are excellent and always have Physician oversight!).   Subjective:   [name] is a 63 y.o. female presenting today for follow up of  Chief Complaint  Patient presents with   Allergic Rhinitis     States that she had cataract surgery 3/4 years ago. States she has headaches that she thought the surgery would help. Pt states the nasal spray atrovent  that used is not helping anymore. Also uses flonase  at night and allegra during the day. Nasal rinses do not help    [name] has a history of the following: Patient Active Problem List   Diagnosis Date Noted   Bloating 02/22/2024   Idiopathic peripheral neuropathy 01/12/2024   Closed fracture of distal end of right fibula with routine healing 01/01/2024   Sprain of left wrist  08/04/2023   Small intestinal bacterial overgrowth (SIBO) 12/08/2022   Chronic fatigue 11/09/2022   Family history of breast cancer 11/08/2022   Osteoporosis with current pathological fracture 08/29/2022   Intestinal methanogen overgrowth 04/28/2022   History of total abdominal hysterectomy 03/28/2022   Dystrophia unguium 02/01/2022   Lumbar compression fracture (HCC) 12/23/2021   Lumbar spondylosis 11/04/2021   Encounter for general adult medical examination with abnormal findings 07/07/2021   Gastric polyp 05/31/2021   NASH (nonalcoholic steatohepatitis) 04/01/2021   Dysphagia 04/01/2021   Stage 3a chronic kidney disease (HCC) 03/16/2021   Arthritis 01/31/2021   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   History of hemorrhagic stroke with residual hemiplegia (HCC) 06/15/2020   Urinary incontinence 06/15/2020   Asthma due to seasonal allergies 06/15/2020   Vaginal itching 03/16/2020   S/P right knee arthroscopy 07/13/17 07/20/2017   Derangement of posterior horn of medial meniscus of right knee    Chondromalacia, patella, right    Irritable bowel syndrome with constipation and diarrhea 05/11/2015   Gait instability 01/06/2015   DDD (degenerative disc disease), lumbar 02/18/2014   Postmenopausal atrophic vaginitis 11/01/2013   Lipoma of abdominal wall 08/14/2013   Paresthesia of foot 02/12/2013   Hypertension    Recurrent depression 11/17/2011   Insomnia 11/17/2011   Mixed hyperlipidemia 09/20/2011   Anxiety 09/20/2011    History obtained from: chart review and patient and husband.  Discussed the use of AI scribe software for clinical note transcription with the patient  and/or guardian, who gave verbal consent to proceed.  [name] is a 63 y.o. female presenting for a follow up visit.  She was last seen in May 2024.  At that time, she continued to have a cough that was never improved with asthma medications.  We felt this is likely secondary to postnasal drip.  For her rhinitis, we will start  Atrovent .  For her nose out.  Will continue with Claritin and Singulair .  We also continue with Tums as needed for her reflux.  We asked her to follow-up in 6 weeks and she presents now over 1 year later.  Since the last visit, he has been about the same.   Allergic Rhinitis Symptom History: She describes significant sinus congestion, with one side of her nose obstructed, preventing saline solution from passing through. This causes discomfort and a sensation of needing her sinuses 'ringed out.' She underwent turbinate reduction surgery in February 2023, which she found difficult, yet continues to experience sinus issues and ear pressure. She uses Flonase  at night and Allegra for symptom management. She previously used Singulair  (montelukast ) but discontinued it due to adverse side effects.  She has a history of allergies, including a positive test for ragweed, and recalls administering allergy shots in the past. Due to a fracture and severe symptoms when exposed to allergens, she has not been outside much in the past three years. No frequent sneezing but reports ear pressure and headaches. Describes one side of the nose not allowing saline solution to pass through. Reports occasional nose running in the morning.   She experiences severe headaches, initially suspected to be related to her eyes. After undergoing lens transplants for cataracts and consulting her eye doctor, she was told by her eye doctor that the headaches were not coming from her eyes. Despite this, the headaches persist and are severe in nature.  GERD Symptom History: She takes omeprazole  for heartburn, using it regularly, and mentions purchasing Prilosec over the counter.  She has a history of high blood pressure, which fluctuates between low and high. She is currently taking a full dose of her blood pressure medication after adjusting from a half dose due to these fluctuations.  Otherwise, there have been no changes to [name]'s past  medical history, surgical history, family history, or social history.    Review of systems otherwise negative other than that mentioned in the HPI.    Objective:   Blood pressure 108/70, pulse 73, temperature 98 F (36.7 C), temperature source Temporal, resp. rate 18, height 5' 3 (1.6 m), weight 152 lb 6.4 oz (69.1 kg), SpO2 93%. Body mass index is 27 kg/m.    Physical Exam Vitals reviewed.  Constitutional:      Appearance: [name] is well-developed.  HENT:     Head: Normocephalic and atraumatic.     Right Ear: Tympanic membrane, ear canal and external ear normal. No drainage, swelling or tenderness. Tympanic membrane is not injected, scarred, erythematous, retracted or bulging.     Left Ear: Tympanic membrane, ear canal and external ear normal. No drainage, swelling or tenderness. Tympanic membrane is not injected, scarred, erythematous, retracted or bulging.     Nose: No nasal deformity, septal deviation, mucosal edema or rhinorrhea.     Right Turbinates: Enlarged, swollen and pale.     Left Turbinates: Enlarged, swollen and pale.     Right Sinus: No maxillary sinus tenderness or frontal sinus tenderness.     Left Sinus: No maxillary sinus tenderness or frontal sinus tenderness.  Mouth/Throat:     Mouth: Mucous membranes are not pale and not dry.     Pharynx: Uvula midline.     Comments: Cobblestoning present in the posterior oropharynx.  Eyes:     General:        Right eye: No discharge.        Left eye: No discharge.     Conjunctiva/sclera: Conjunctivae normal.     Right eye: Right conjunctiva is not injected. No chemosis.    Left eye: Left conjunctiva is not injected. No chemosis.    Pupils: Pupils are equal, round, and reactive to light.  Cardiovascular:     Rate and Rhythm: Normal rate and regular rhythm.     Heart sounds: Normal heart sounds.  Pulmonary:     Effort: Pulmonary effort is normal. No tachypnea, accessory muscle usage or respiratory distress.      Breath sounds: Normal breath sounds. No wheezing, rhonchi or rales.     Comments: Moving air well in all lung fields. No increased work of breathing noted. No crackles or wheezes noted.  Chest:     Chest wall: No tenderness.  Abdominal:     Tenderness: There is no abdominal tenderness. There is no guarding or rebound.  Lymphadenopathy:     Head:     Right side of head: No submandibular, tonsillar or occipital adenopathy.     Left side of head: No submandibular, tonsillar or occipital adenopathy.     Cervical: No cervical adenopathy.  Skin:    Coloration: Skin is not pale.     Findings: No abrasion, erythema, petechiae or rash. Rash is not papular, urticarial or vesicular.  Neurological:     Mental Status: [name] is alert.  Psychiatric:        Behavior: Behavior is cooperative.      Diagnostic studies: none        Marty Shaggy, MD  [facility] 

## 2024-03-05 ENCOUNTER — Encounter: Payer: Self-pay | Admitting: Allergy & Immunology

## 2024-03-06 ENCOUNTER — Encounter: Payer: Self-pay | Admitting: Allergy & Immunology

## 2024-03-11 ENCOUNTER — Ambulatory Visit: Admitting: Orthopedic Surgery

## 2024-03-11 ENCOUNTER — Other Ambulatory Visit (INDEPENDENT_AMBULATORY_CARE_PROVIDER_SITE_OTHER): Payer: Self-pay

## 2024-03-11 VITALS — BP 110/75 | HR 71 | Ht 63.0 in | Wt 153.0 lb

## 2024-03-11 DIAGNOSIS — S32020A Wedge compression fracture of second lumbar vertebra, initial encounter for closed fracture: Secondary | ICD-10-CM | POA: Diagnosis not present

## 2024-03-11 MED ORDER — METHOCARBAMOL 500 MG PO TABS: 500.0000 mg | ORAL_TABLET | Freq: Three times a day (TID) | ORAL | 0 refills | Status: DC | PRN

## 2024-03-11 NOTE — Progress Notes (Signed)
 Orthopedic Spine Surgery Office Note  Assessment: Patient is a 63 y.o. female with chronic axial back pain.  History of compression fractures at L1 and L2.  No radicular symptoms   Plan: -Explained that initially conservative treatment is tried as a significant number of patients may experience relief with these treatment modalities. Discussed that the conservative treatments include:  -activity modification  -physical therapy  -over the counter pain medications  -medrol  dosepak  -lumbar steroid injections -Patient has tried PT, Tylenol , Lyrica  -Recommended core strengthening program.  Exercise packet provided to her today.  She should continue with the Tylenol .  She can use Salonpas.  Prescribed Robaxin  for additional pain relief -If she is not doing any better at her next visit, we will order an MRI of the lumbar spine to evaluate further -Patient should return to office in 6 weeks, x-rays at next visit: None   Patient expressed understanding of the plan and all questions were answered to the patient's satisfaction.   ___________________________________________________________________________   History:  Patient is a 63 y.o. female who presents today for lumbar spine.  Patient has a history of 2 compression fractures in her back.  She initially had 1 at L1 and then subsequently developed 1 at L2.  She has had this pain for several years.  She has been on Tymlos for the last 2 years for her osteoporosis.  She feels the pain in the upper lumbar region.  She notes it is worse if she is standing or walking.  Gets better but does not go away if she sits down.  She also notes that when she is at the gym.  She does not have any pain radiating to either lower extremity.  Weakness: Yes, has left upper extremity weakness from her prior stroke.  No other weakness noted Symptoms of imbalance: Denies Paresthesias and numbness: Denies Bowel or bladder incontinence: Denies Saddle anesthesia:  Denies  Treatments tried: PT, Tylenol   Review of systems: Denies fevers and chills, night sweats, unexplained weight loss, history of cancer.  Has had pain that wakes her at night  Past medical history: Osteoporosis on Tymlos IBS GERD HLD HTN CKD Depression/anxiety Migraines Chronic pain History of stroke  Allergies: morphine, phenergan   Past surgical history:  Brain surgery to remove a clot after stroke Cervical spine fusion Cholecystectomy Right knee chondroplasty Right knee partial medial meniscectomy Lipoma excision Polypectomy Rectocele repair Hysterectomy  Social history: Denies use of nicotine product (smoking, vaping, patches, smokeless) Alcohol use: Denies Denies recreational drug use   Physical Exam:  BMI of 27.1  General: no acute distress, appears stated age Neurologic: alert, answering questions appropriately, following commands Respiratory: unlabored breathing on room air, symmetric chest rise Psychiatric: appropriate affect, normal cadence to speech   MSK (spine):  -Strength exam      Left  Right EHL    5/5  5/5 TA    5/5  5/5 GSC    5/5  5/5 Knee extension  5/5  5/5 Hip flexion   5/5  5/5  -Sensory exam    Sensation intact to light touch in L3-S1 nerve distributions of bilateral lower extremities   Imaging: XRs of the lumbar spine from 03/11/2024 were independently reviewed and interpreted, showing chronic appearing compression fractures at L1 and L2.  No acute fracture seen.  No spondylolisthesis seen.  No significant disc height loss seen.   Patient name: [name] Patient MRN: [id number] Date of visit: 03/11/24

## 2024-03-14 ENCOUNTER — Institutional Professional Consult (permissible substitution) (INDEPENDENT_AMBULATORY_CARE_PROVIDER_SITE_OTHER): Admitting: Otolaryngology

## 2024-03-19 ENCOUNTER — Encounter: Payer: Self-pay | Admitting: Internal Medicine

## 2024-03-19 ENCOUNTER — Encounter (INDEPENDENT_AMBULATORY_CARE_PROVIDER_SITE_OTHER): Payer: Self-pay | Admitting: Gastroenterology

## 2024-03-19 ENCOUNTER — Other Ambulatory Visit: Payer: Self-pay | Admitting: Internal Medicine

## 2024-03-19 DIAGNOSIS — I10 Essential (primary) hypertension: Secondary | ICD-10-CM

## 2024-03-20 ENCOUNTER — Other Ambulatory Visit: Payer: Self-pay | Admitting: Internal Medicine

## 2024-03-20 DIAGNOSIS — N3941 Urge incontinence: Secondary | ICD-10-CM

## 2024-03-20 MED ORDER — MIRABEGRON ER 50 MG PO TB24: 50.0000 mg | ORAL_TABLET | Freq: Every day | ORAL | 5 refills | Status: AC

## 2024-04-03 ENCOUNTER — Ambulatory Visit (INDEPENDENT_AMBULATORY_CARE_PROVIDER_SITE_OTHER): Admitting: Otolaryngology

## 2024-04-03 ENCOUNTER — Encounter (INDEPENDENT_AMBULATORY_CARE_PROVIDER_SITE_OTHER): Payer: Self-pay | Admitting: Otolaryngology

## 2024-04-03 VITALS — BP 136/81 | HR 77 | Ht 63.0 in | Wt 153.0 lb

## 2024-04-03 DIAGNOSIS — Z87891 Personal history of nicotine dependence: Secondary | ICD-10-CM

## 2024-04-03 DIAGNOSIS — J3089 Other allergic rhinitis: Secondary | ICD-10-CM

## 2024-04-03 DIAGNOSIS — R519 Headache, unspecified: Secondary | ICD-10-CM | POA: Diagnosis not present

## 2024-04-03 DIAGNOSIS — R0981 Nasal congestion: Secondary | ICD-10-CM | POA: Diagnosis not present

## 2024-04-03 DIAGNOSIS — R0982 Postnasal drip: Secondary | ICD-10-CM

## 2024-04-03 DIAGNOSIS — J302 Other seasonal allergic rhinitis: Secondary | ICD-10-CM

## 2024-04-03 MED ORDER — AZELASTINE HCL 0.1 % NA SOLN: 2.0000 | Freq: Two times a day (BID) | NASAL | 12 refills | Status: DC

## 2024-04-03 MED ORDER — PREDNISOLONE ACETATE 1 % OP SUSP: OPHTHALMIC | 5 refills | Status: AC

## 2024-04-03 NOTE — Patient Instructions (Signed)
 Put 6 drops of the pred forte drops in the water  rinse and flush half on each side After that, put 2 sprays of astelin  in each nostril and use it twice per day Use claritin or another allergy pill each day.

## 2024-04-03 NOTE — Progress Notes (Signed)
 Dear Dr. Iva, Here is my assessment for our mutual patient, [name]. Thank you for allowing me the opportunity to care for your patient. Please do not hesitate to contact me should you have any other questions. Sincerely, Dr. Eldora Blanch  Otolaryngology Clinic Note  HISTORY: [name] is a 63 y.o. female kindly referred by Dr. Iva for evaluation of headaches, PND and rhinitis, and congestion.  Initial visit (04/2024): Patient reports her main complaint is congestion and headaches which are chronic and going since about 2023 She has undergone septo/turbs with Dr. Karis in 2023 which provided mild benefit but some congestion persists. In addition, she also reports chronic headaches which she describes as frequent and going over frontal and posterior head.   She has seen Dr. Iva for allergy testing with multiple positives. She denies any CRS sx or frequent sinus infection sx including discolored drainage, max/ethmoid pressure, hyposmia. She was on saline rinses, and has tried flonase , allegra, singulair , atrovent .   Atrovent  used to help but not currently. She is currently on allegra and flonase .  She has also tried pain medication which temporarily helps with headache No light sensitivity or sound sensitivity. Has not seen Neuro for this. Abx do not help.  Allergy testing has been done No recent CT  GLP-1: no AP/AC: no  Tobacco: prior, quit  H/o Craniotomy with prior noted encephalomalacia  RADIOGRAPHIC EVALUATION AND INDEPENDENT REVIEW OF OTHER RECORDS:: Dr. Iva notes (03/01/2024): noted cough and SAR and PAR with trial of multiple sprays; rec allegra and flonase ; persistent headaches and congestion, ref to ENT CBC and CMP 6/202/2025: WBC 6.3, Plt 294, Eos 100; BUN/Cr 19/1.16  Past Medical History:  Diagnosis Date   Allergy    grass, dust , mold   Anxiety    Arthritis    Asthma due to seasonal allergies 06/15/2020   Bipolar disorder (HCC)     Carpal tunnel syndrome    Bilateral   Chest pain 09/2011   Cardiac cath-normal coronaries   Constipation    Depression    Difficulty urinating 05/31/2013   Elevated LFTs 12/16/2013   Encounter for general adult medical examination with abnormal findings 07/07/2021   GERD (gastroesophageal reflux disease)    History of kidney stones    Hyperlipemia    Hyperlipidemia    Hypertension    Mild; provoked by stress and anxiety   IBS (irritable bowel syndrome)    Intracerebral bleed (HCC)    No aneurysm; followed by Dr. Alix Brisker replaced    lense transplant 2022; pt states lense don't dilate or constrict   Loss of weight 01/06/2015   Osteoporosis    Stage 3a chronic kidney disease (HCC) 03/16/2021   Stroke (HCC) 1999   hemorrhagic stroke; weakness of left side   Past Surgical History:  Procedure Laterality Date   BIOPSY  04/16/2021   Procedure: BIOPSY;  Surgeon: Eartha Angelia Sieving, MD;  Location: [facility];  Service: Gastroenterology;;  small, bowel, esophageal(proximal and distal);   BIOPSY  07/21/2023   Procedure: BIOPSY;  Surgeon: Eartha Angelia, Sieving, MD;  Location: [facility];  Service: Gastroenterology;;   BRAIN SURGERY  1999   to remove blood clot after stroke    CARDIAC CATHETERIZATION  2016   CERVICAL FUSION     CHOLECYSTECTOMY N/A 10/14/2014   Procedure: LAPAROSCOPIC CHOLECYSTECTOMY WITH INTRAOPERATIVE CHOLANGIOGRAM;  Surgeon: Krystal Russell, MD;  Location: [facility] OR;  Service: General;  Laterality: N/A;   CHONDROPLASTY Right 07/13/2017   Procedure:  CHONDROPLASTY of patella;  Surgeon: Margrette Taft BRAVO, MD;  Location: [facility];  Service: Orthopedics;  Laterality: Right;   ESOPHAGEAL DILATION N/A 04/16/2021   Procedure: ESOPHAGEAL DILATION;  Surgeon: Eartha Angelia Sieving, MD;  Location: [facility];  Service: Gastroenterology;  Laterality: N/A;   ESOPHAGOGASTRODUODENOSCOPY (EGD) WITH PROPOFOL  N/A 04/16/2021   Procedure:  ESOPHAGOGASTRODUODENOSCOPY (EGD) WITH PROPOFOL ;  Surgeon: Eartha Angelia Sieving, MD;  Location: [facility];  Service: Gastroenterology;  Laterality: N/A;  1:35, pt knows to arrive at 9:45   ESOPHAGOGASTRODUODENOSCOPY (EGD) WITH PROPOFOL  N/A 07/21/2023   Procedure: ESOPHAGOGASTRODUODENOSCOPY (EGD) WITH PROPOFOL ;  Surgeon: Eartha Angelia Sieving, MD;  Location: [facility];  Service: Gastroenterology;  Laterality: N/A;  9:15AM;ASA 1-2   EUS N/A 08/21/2015   Procedure: ESOPHAGEAL ENDOSCOPIC ULTRASOUND (EUS) RADIAL;  Surgeon: Belvie Just, MD;  Location: [facility];  Service: Endoscopy;  Laterality: N/A;   KNEE ARTHROSCOPY WITH MEDIAL MENISECTOMY Right 07/13/2017   Procedure: KNEE ARTHROSCOPY WITH PARTIAL MEDIAL MENISECTOMY;  Surgeon: Margrette Taft BRAVO, MD;  Location: [facility];  Service: Orthopedics;  Laterality: Right;   LEFT HEART CATHETERIZATION WITH CORONARY ANGIOGRAM N/A 09/23/2011   Procedure: LEFT HEART CATHETERIZATION WITH CORONARY ANGIOGRAM;  Surgeon: Aleene JINNY Passe, MD;  Location: [facility] C[facility];  Service: Cardiovascular;  Laterality: N/A;   LIPOMA EXCISION Left 11/18/2013   Procedure: EXCISION OF SOFT TISSUE MASS-LEFT THIGH;  Surgeon: Oneil DELENA Budge, MD;  Location: [facility];  Service: General;  Laterality: Left;   NASAL SEPTOPLASTY W/ TURBINOPLASTY Bilateral 08/30/2021   Procedure: NASAL SEPTOPLASTY WITH BILATERAL TURBINATE REDUCTION;  Surgeon: Karis Clunes, MD;  Location: [facility];  Service: ENT;  Laterality: Bilateral;   POLYPECTOMY  04/16/2021   Procedure: POLYPECTOMY;  Surgeon: Eartha Angelia Sieving, MD;  Location: [facility];  Service: Gastroenterology;;  gastric   POLYPECTOMY  07/21/2023   Procedure: POLYPECTOMY;  Surgeon: Eartha Angelia Sieving, MD;  Location: [facility];  Service: Gastroenterology;;   RECTOCELE REPAIR     x2   RECTOCELE REPAIR N/A 04/04/2017   Procedure: POSTERIOR REPAIR (RECTOCELE);  Surgeon: Edsel Norleen GAILS, MD;  Location: [facility];  Service: Gynecology;  Laterality: N/A;   SUBMUCOSAL LIFTING INJECTION  07/21/2023   Procedure: SUBMUCOSAL LIFTING INJECTION;  Surgeon: Eartha Angelia, Sieving, MD;  Location: [facility];  Service: Gastroenterology;;   TOTAL ABDOMINAL HYSTERECTOMY     Family History  Problem Relation Age of Onset   Cancer Mother        breast    Hypertension Mother    Hyperlipidemia Mother    Depression Mother    Anxiety disorder Mother    COPD Mother    Arthritis Mother        rheumatoid   Drug abuse Sister    Coronary artery disease Paternal Grandfather    Coronary artery disease Paternal Uncle    Depression Cousin    Drug abuse Cousin    Social History   Tobacco Use   Smoking status: Former    Current packs/day: 0.00    Average packs/day: 1 pack/day for 19.0 years (19.0 ttl pk-yrs)    Types: Cigarettes    Start date: 09/02/1978    Quit date: 09/01/1997    Years since quitting: 26.6    Passive exposure: Past   Smokeless tobacco: Never   Tobacco comments:    Quit smoking 1999 , previous 20 pack years  Substance Use Topics   Alcohol use: Yes    Comment: 1 drink every other week  Allergies  Allergen Reactions   Morphine And Codeine Hives   Promethazine Hcl Other (See Comments)    Causes patient to become Hyper   Current Outpatient Medications  Medication Sig Dispense Refill   acetaminophen  (TYLENOL  8 HOUR) 650 MG CR tablet Take 650 mg by mouth every 8 (eight) hours.     atorvastatin  (LIPITOR) 40 MG tablet TAKE ONE TABLET BY MOUTH ONCE DAILY. 90 tablet 3   azelastine  (ASTELIN ) 0.1 % nasal spray Place 2 sprays into both nostrils 2 (two) times daily. Use in each nostril as directed 30 mL 12   Blood Glucose Monitoring Suppl DEVI 1 each by Does not apply route in the morning, at noon, and at bedtime. May substitute to any manufacturer covered by patient's insurance. 1 each 0   Calcium  500-125 MG-UNIT TABS Take 1 tablet by mouth daily with supper.     Carboxymethylcellulose Sod PF  0.5 % SOLN Place 1 drop into both eyes daily as needed (dry eyes).     cetirizine (ZYRTEC) 10 MG tablet Take 10 mg by mouth daily.     clotrimazole  (LOTRIMIN  AF) 1 % cream Apply 1 Application topically 2 (two) times daily. 30 g 0   diclofenac  Sodium (VOLTAREN ) 1 % GEL Apply 1 application topically daily as needed (pain).     estradiol  (ESTRACE ) 0.1 MG/GM vaginal cream Use 0.5 gm in vagina at bedtime 2 x weekly 42.5 g 1   ezetimibe  (ZETIA ) 10 MG tablet Take 1 tablet (10 mg total) by mouth daily. 90 tablet 3   famotidine (PEPCID) 20 MG tablet Take 20 mg by mouth 2 (two) times daily. As needed     HYDROcodone -acetaminophen  (NORCO/VICODIN) 5-325 MG tablet Take 1 tablet by mouth every 8 (eight) hours as needed. 30 tablet 0   ipratropium (ATROVENT ) 0.03 % nasal spray Place 2 sprays into both nostrils every 12 (twelve) hours. 30 mL 6   lidocaine -prilocaine  (EMLA ) cream Apply 1 Application topically at bedtime.     lisinopril  (ZESTRIL ) 20 MG tablet Take 1 tablet (20 mg total) by mouth daily. 90 tablet 1   methocarbamol  (ROBAXIN ) 500 MG tablet Take 1 tablet (500 mg total) by mouth every 8 (eight) hours as needed (pain, muscle spasms). 50 tablet 0   mirabegron  ER (MYRBETRIQ ) 50 MG TB24 tablet Take 1 tablet (50 mg total) by mouth daily. 30 tablet 5   montelukast  (SINGULAIR ) 10 MG tablet Take 1 tablet (10 mg total) by mouth at bedtime. 30 tablet 5   Multiple Vitamin (MULITIVITAMIN WITH MINERALS) TABS Take 1 tablet by mouth daily with breakfast.     nystatin  (MYCOSTATIN /NYSTOP ) powder Apply 1 Application topically 3 (three) times daily. 60 g 1   Nystatin  POWD by Does not apply route. As needed.     Olopatadine HCl 0.2 % SOLN Place 1 drop into both eyes in the morning and at bedtime.     Omega-3 Fatty Acids (FISH OIL ) 1000 MG CAPS Take 2,000 mg by mouth.     omeprazole  (PRILOSEC) 40 MG capsule Take 1 capsule (40 mg total) by mouth 2 (two) times daily. 60 capsule 0   OVER THE COUNTER MEDICATION Milk thistle  once per day.  Mushrooms once daily.  Vit K 2 once per day.  Beet root gummies daily     prednisoLONE acetate (PRED FORTE) 1 % ophthalmic suspension Put 6 drops in the rinse bottle and flush half on each side. 15 mL 5   pregabalin  (LYRICA ) 25 MG capsule TAKE ONE CAPSULE BY  MOUTH EVERY DAY 30 capsule 3   Probiotic Product (TRUBIOTICS PO) Take 1 capsule by mouth daily. Takes 25 cfu daily.     pyridOXINE (VITAMIN B-6) 100 MG tablet Take 100 mg by mouth daily.     rifaximin  (XIFAXAN ) 550 MG TABS tablet Take 1 tablet (550 mg total) by mouth 3 (three) times daily. 42 tablet 0   rifaximin  (XIFAXAN ) 550 MG TABS tablet 1 PO 3 times a day; Duration: 14 days     traMADol -acetaminophen  (ULTRACET ) 37.5-325 MG tablet Take 1 tablet by mouth every 4 (four) hours as needed. 90 tablet 5   TYMLOS 3120 MCG/1.56ML SOPN One qhs     ALPRAZolam  (XANAX ) 1 MG tablet Take 1 tablet (1 mg total) by mouth 3 (three) times daily. 90 tablet 2   methylphenidate  (CONCERTA ) 36 MG PO CR tablet Take 1 tablet (36 mg total) by mouth every morning. 30 tablet 0   traZODone  (DESYREL ) 100 MG tablet TAKE (2) TABLETS BY MOUTH AT BEDTIME. 60 tablet 2   venlafaxine  XR (EFFEXOR  XR) 150 MG 24 hr capsule Take 1 capsule (150 mg total) by mouth daily with breakfast. 90 capsule 2   No current facility-administered medications for this visit.   BP 136/81 (BP Location: Right Arm, Patient Position: Sitting, Cuff Size: Normal)   Pulse 77   Ht 5' 3 (1.6 m)   Wt 153 lb (69.4 kg)   SpO2 90%   BMI 27.10 kg/m   PHYSICAL EXAM:  BP 136/81 (BP Location: Right Arm, Patient Position: Sitting, Cuff Size: Normal)   Pulse 77   Ht 5' 3 (1.6 m)   Wt 153 lb (69.4 kg)   SpO2 90%   BMI 27.10 kg/m    Salient findings:  CN II-XII intact Bilateral EAC clear and TM intact with well pneumatized middle ear spaces Nose: Anterior rhinoscopy reveals septum relatively midline, bilateral inferior turbinates reduced.  Nasal endoscopy was indicated to better  evaluate the nose and paranasal sinuses, given the patient's history and exam findings, and is detailed below. Mod cottle neg b/l No lesions of oral cavity/oropharynx No obviously palpable neck masses/lymphadenopathy/thyromegaly No respiratory distress or stridor   PROCEDURE:  Prior to initiating any procedures, risks/benefits/alternatives were explained to the patient and verbal consent obtained. Diagnostic Nasal Endoscopy Pre-procedure diagnosis: Nasal congestion, PND Post-procedure diagnosis: same Indication: See pre-procedure diagnosis and physical exam above Complications: None apparent EBL: 0 mL Anesthesia: Lidocaine  4% and topical decongestant was topically sprayed in each nasal cavity  Description of Procedure:  Patient was identified. A rigid 30 degree endoscope was utilized to evaluate the sinonasal cavities, mucosa, sinus ostia and turbinates and septum.  Overall, signs of mucosal inflammation are not noted.  Also noted are septum relatively midline.  No mucopurulence, polyps, or masses noted.   Right Middle meatus: clear Right SE Recess: clear Left MM: clear Left SE Recess: clear  Photodocumentation was obtained.  CPT CODE -- 31231 - Mod 25   ASSESSMENT:  63 y.o. with:  1. Nasal congestion   2. Post-nasal drip   3. Seasonal and perennial allergic rhinitis   4. Chronic nonintractable headache, unspecified headache type    Do not think congestion related to her septum; she also had headaches with h/o craniotomy -- DDX for this is wide but given no CRS sx, suspect her headaches are not related to her sinuses. Will treat her maximally medically; if sx persist, consider MRI and ref to Neuro  PLAN: We've discussed issues and options today.  We  reviewed the nasal endoscopy images together.  The risks, benefits and alternatives were discussed and questions answered.  [name] has elected to proceed with:  1) Rinse with pred forte 6 drops daily 2) PO anthistamine -  continue 3) Start astelin  BID (can put in rinse) F/u 8 weeks  -- sooner as necessary.  See below regarding exact medications prescribed this encounter including dosages and route: Meds ordered this encounter  Medications   prednisoLONE acetate (PRED FORTE) 1 % ophthalmic suspension    Sig: Put 6 drops in the rinse bottle and flush half on each side.    Dispense:  15 mL    Refill:  5   azelastine  (ASTELIN ) 0.1 % nasal spray    Sig: Place 2 sprays into both nostrils 2 (two) times daily. Use in each nostril as directed    Dispense:  30 mL    Refill:  12     Thank you for allowing me the opportunity to care for your patient. Please do not hesitate to contact me should you have any other questions.  Sincerely, Eldora Blanch, MD Otolaryngologist (ENT), [facility] Phone: [phone] Fax: [phone]  MDM:  Level 4: [id number] Complexity/Problems addressed: mod - chronic problems, multiple Data complexity: mod - independent review of note, labs - Morbidity: mod  - Prescription Drug prescribed or managed: y  04/13/2024, 7:38 PM

## 2024-04-11 ENCOUNTER — Telehealth (HOSPITAL_COMMUNITY): Admitting: Psychiatry

## 2024-04-11 ENCOUNTER — Encounter (HOSPITAL_COMMUNITY): Payer: Self-pay | Admitting: Psychiatry

## 2024-04-11 DIAGNOSIS — F988 Other specified behavioral and emotional disorders with onset usually occurring in childhood and adolescence: Secondary | ICD-10-CM

## 2024-04-11 DIAGNOSIS — F419 Anxiety disorder, unspecified: Secondary | ICD-10-CM | POA: Diagnosis not present

## 2024-04-11 DIAGNOSIS — G8929 Other chronic pain: Secondary | ICD-10-CM | POA: Diagnosis not present

## 2024-04-11 DIAGNOSIS — F332 Major depressive disorder, recurrent severe without psychotic features: Secondary | ICD-10-CM

## 2024-04-11 MED ORDER — METHYLPHENIDATE HCL ER (OSM) 36 MG PO TBCR: 36.0000 mg | EXTENDED_RELEASE_TABLET | ORAL | 0 refills | Status: DC

## 2024-04-11 MED ORDER — ALPRAZOLAM 1 MG PO TABS: 1.0000 mg | ORAL_TABLET | Freq: Three times a day (TID) | ORAL | 2 refills | Status: DC

## 2024-04-11 MED ORDER — TRAZODONE HCL 100 MG PO TABS: ORAL_TABLET | ORAL | 2 refills | Status: DC

## 2024-04-11 MED ORDER — VENLAFAXINE HCL ER 150 MG PO CP24: 150.0000 mg | ORAL_CAPSULE | Freq: Every day | ORAL | 2 refills | Status: DC

## 2024-04-11 NOTE — Progress Notes (Signed)
 Virtual Visit via Telephone Note  I connected with [name] on 04/11/24 at  3:00 PM EDT by telephone and verified that I am speaking with the correct person using two identifiers.  Location: Patient: home Provider: office   I discussed the limitations, risks, security and privacy concerns of performing an evaluation and management service by telephone and the availability of in person appointments. I also discussed with the patient that there may be a patient responsible charge related to this service. The patient expressed understanding and agreed to proceed.      I discussed the assessment and treatment plan with the patient. The patient was provided an opportunity to ask questions and all were answered. The patient agreed with the plan and demonstrated an understanding of the instructions.   The patient was advised to call back or seek an in-person evaluation if the symptoms worsen or if the condition fails to improve as anticipated.  I provided 20 minutes of non-face-to-face time during this encounter.   Barnie Gull, MD  [facility] MD/PA/NP OP Progress Note  04/11/2024 3:20 PM HEPHZIBAH Santiago  MRN:  [id number]  Chief Complaint:  Chief Complaint  Patient presents with   Depression   Anxiety   Follow-up   HPI: This patient is a 63 year old married white female lives with her husband in [place].  She has no children.  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  The patient returns for follow-up after 3 months regarding her major depression, generalized anxiety disorder as well as difficulties with fatigue and memory loss.  She took another fall and hurt her wrist again.  She has numerous physical complaints and is now complaining of chronic headache.  She saw ENT and has been placed on new nasal sprays.  She has a lot of bodyaches and fatigue.  She states that she is not able to focus.  In the past I had sent in Concerta  but I  am not sure if she ever tried it.  She is sleeping fairly well with the trazodone .  She is frustrated with all of her ailments but denies significant depression or thoughts of self-harm.  The Xanax  continues to help her anxiety Visit Diagnosis:    ICD-10-CM   1. Major depressive disorder, recurrent, severe without psychotic features (HCC)  F33.2     2. ADD (attention deficit disorder) without hyperactivity  F98.8       Past Psychiatric History: Past outpatient treatment for depression  Past Medical History:  Past Medical History:  Diagnosis Date   Allergy    grass, dust , mold   Anxiety    Arthritis    Asthma due to seasonal allergies 06/15/2020   Bipolar disorder (HCC)    Carpal tunnel syndrome    Bilateral   Chest pain 09/2011   Cardiac cath-normal coronaries   Constipation    Depression    Difficulty urinating 05/31/2013   Elevated LFTs 12/16/2013   Encounter for general adult medical examination with abnormal findings 07/07/2021   GERD (gastroesophageal reflux disease)    History of kidney stones    Hyperlipemia    Hyperlipidemia    Hypertension    Mild; provoked by stress and anxiety   IBS (irritable bowel syndrome)    Intracerebral bleed (HCC)    No aneurysm; followed by Dr. Alix Brisker replaced    lense transplant 2022; pt states lense don't dilate or constrict  Loss of weight 01/06/2015   Osteoporosis    Stage 3a chronic kidney disease (HCC) 03/16/2021   Stroke (HCC) 1999   hemorrhagic stroke; weakness of left side    Past Surgical History:  Procedure Laterality Date   BIOPSY  04/16/2021   Procedure: BIOPSY;  Surgeon: Eartha Angelia Sieving, MD;  [facility];  Service: Gastroenterology;;  small, bowel, esophageal(proximal and distal);   BIOPSY  07/21/2023   Procedure: BIOPSY;  Surgeon: Eartha Angelia, Sieving, MD;  [facility];  Service: Gastroenterology;;   BRAIN SURGERY  1999   to remove blood clot after stroke     CARDIAC CATHETERIZATION  2016   CERVICAL FUSION     CHOLECYSTECTOMY N/A 10/14/2014   Procedure: LAPAROSCOPIC CHOLECYSTECTOMY WITH INTRAOPERATIVE CHOLANGIOGRAM;  Surgeon: Krystal Russell, MD;  Location: [facility] OR;  Service: General;  Laterality: N/A;   CHONDROPLASTY Right 07/13/2017   Procedure: CHONDROPLASTY of patella;  Surgeon: Margrette Taft BRAVO, MD;  Location: [facility];  Service: Orthopedics;  Laterality: Right;   ESOPHAGEAL DILATION N/A 04/16/2021   Procedure: ESOPHAGEAL DILATION;  Surgeon: Eartha Angelia Sieving, MD;  [facility];  Service: Gastroenterology;  Laterality: N/A;   ESOPHAGOGASTRODUODENOSCOPY (EGD) WITH PROPOFOL  N/A 04/16/2021   Procedure: ESOPHAGOGASTRODUODENOSCOPY (EGD) WITH PROPOFOL ;  Surgeon: Eartha Angelia Sieving, MD;  [facility];  Service: Gastroenterology;  Laterality: N/A;  1:35, pt knows to arrive at 9:45   ESOPHAGOGASTRODUODENOSCOPY (EGD) WITH PROPOFOL  N/A 07/21/2023   Procedure: ESOPHAGOGASTRODUODENOSCOPY (EGD) WITH PROPOFOL ;  Surgeon: Eartha Angelia Sieving, MD;  [facility];  Service: Gastroenterology;  Laterality: N/A;  9:15AM;ASA 1-2   EUS N/A 08/21/2015   Procedure: ESOPHAGEAL ENDOSCOPIC ULTRASOUND (EUS) RADIAL;  Surgeon: Belvie Just, MD;  [facility];  Service: Endoscopy;  Laterality: N/A;   KNEE ARTHROSCOPY WITH MEDIAL MENISECTOMY Right 07/13/2017   Procedure: KNEE ARTHROSCOPY WITH PARTIAL MEDIAL MENISECTOMY;  Surgeon: Margrette Taft BRAVO, MD;  Location: [facility];  Service: Orthopedics;  Laterality: Right;   LEFT HEART CATHETERIZATION WITH CORONARY ANGIOGRAM N/A 09/23/2011   Procedure: LEFT HEART CATHETERIZATION WITH CORONARY ANGIOGRAM;  Surgeon: Aleene JINNY Passe, MD;  Location: [facility] CATH LAB;  Service: Cardiovascular;  Laterality: N/A;   LIPOMA EXCISION Left 11/18/2013   Procedure: EXCISION OF SOFT TISSUE MASS-LEFT THIGH;  Surgeon: Oneil DELENA Budge, MD;  Location: [facility];  Service: General;  Laterality: Left;    NASAL SEPTOPLASTY W/ TURBINOPLASTY Bilateral 08/30/2021   Procedure: NASAL SEPTOPLASTY WITH BILATERAL TURBINATE REDUCTION;  Surgeon: Karis Clunes, MD;  Location: [facility];  Service: ENT;  Laterality: Bilateral;   POLYPECTOMY  04/16/2021   Procedure: POLYPECTOMY;  Surgeon: Eartha Angelia Sieving, MD;  [facility];  Service: Gastroenterology;;  gastric   POLYPECTOMY  07/21/2023   Procedure: POLYPECTOMY;  Surgeon: Eartha Angelia Sieving, MD;  [facility];  Service: Gastroenterology;;   RECTOCELE REPAIR     x2   RECTOCELE REPAIR N/A 04/04/2017   Procedure: POSTERIOR REPAIR (RECTOCELE);  Surgeon: Edsel Norleen GAILS, MD;  Location: [facility];  Service: Gynecology;  Laterality: N/A;   SUBMUCOSAL LIFTING INJECTION  07/21/2023   Procedure: SUBMUCOSAL LIFTING INJECTION;  Surgeon: Eartha Angelia Sieving, MD;  [facility];  Service: Gastroenterology;;   TOTAL ABDOMINAL HYSTERECTOMY      Family Psychiatric History: See below  Family History:  Family History  Problem Relation Age of Onset   Cancer Mother        breast    Hypertension Mother  Hyperlipidemia Mother    Depression Mother    Anxiety disorder Mother    COPD Mother    Arthritis Mother        rheumatoid   Drug abuse Sister    Coronary artery disease Paternal Grandfather    Coronary artery disease Paternal Uncle    Depression Cousin    Drug abuse Cousin     Social History:  Social History   Socioeconomic History   Marital status: Married    Spouse name: Christopher   Number of children: 0   Years of education: HS   Highest education level: Not on file  Occupational History   Occupation: unemployed    Comment: pending disability  Tobacco Use   Smoking status: Former    Current packs/day: 0.00    Average packs/day: 1 pack/day for 19.0 years (19.0 ttl pk-yrs)    Types: Cigarettes    Start date: 09/02/1978    Quit date: 09/01/1997    Years since quitting: 26.6    Passive  exposure: Past   Smokeless tobacco: Never   Tobacco comments:    Quit smoking 1999 , previous 20 pack years  Vaping Use   Vaping status: Never Used  Substance and Sexual Activity   Alcohol use: Yes    Comment: 1 drink every other week   Drug use: No   Sexual activity: Not Currently    Partners: Male    Birth control/protection: Surgical    Comment: hyst   Other Topics Concern   Not on file  Social History Narrative   Currently unable to work   Lives in [place]   Married   Patient drinks 1 cup of caffeine daily.   Patient is right handed.    Joined the [employer] to get more exercise   Social Drivers of Health   Financial Resource Strain: Low Risk  (04/19/2023)   Overall Financial Resource Strain (CARDIA)    Difficulty of Paying Living Expenses: Not hard at all  Food Insecurity: No Food Insecurity (04/19/2023)   Hunger Vital Sign    Worried About Running Out of Food in the Last Year: Never true    Ran Out of Food in the Last Year: Never true  Transportation Needs: No Transportation Needs (04/19/2023)   PRAPARE - Administrator, Civil Service (Medical): No    Lack of Transportation (Non-Medical): No  Physical Activity: Sufficiently Active (04/19/2023)   Exercise Vital Sign    Days of Exercise per Week: 7 days    Minutes of Exercise per Session: 30 min  Stress: No Stress Concern Present (04/19/2023)   [employer] of Occupational Health - Occupational Stress Questionnaire    Feeling of Stress : Only a little  Social Connections: Moderately Isolated (04/19/2023)   Social Connection and Isolation Panel    Frequency of Communication with Friends and Family: Twice a week    Frequency of Social Gatherings with Friends and Family: Once a week    Attends Religious Services: Never    Database administrator or Organizations: No    Attends Banker Meetings: Never    Marital Status: Married    Allergies:  Allergies  Allergen Reactions   Morphine And  Codeine Hives   Promethazine Hcl Other (See Comments)    Causes patient to become Hyper    Metabolic Disorder Labs: Lab Results  Component Value Date   HGBA1C 5.5 07/31/2023   MPG 117 (H) 01/06/2015   MPG 117 (H) 02/17/2014  No results found for: PROLACTIN Lab Results  Component Value Date   CHOL 124 12/22/2023   TRIG 152 (H) 12/22/2023   HDL 51 12/22/2023   CHOLHDL 2.4 12/22/2023   VLDL 26 09/16/2015   LDLCALC 47 12/22/2023   LDLCALC 58 02/10/2023   Lab Results  Component Value Date   TSH 1.100 12/22/2023   TSH 1.410 02/10/2023    Therapeutic Level Labs: No results found for: LITHIUM No results found for: VALPROATE No results found for: CBMZ  Current Medications: Current Outpatient Medications  Medication Sig Dispense Refill   acetaminophen  (TYLENOL  8 HOUR) 650 MG CR tablet Take 650 mg by mouth every 8 (eight) hours.     ALPRAZolam  (XANAX ) 1 MG tablet Take 1 tablet (1 mg total) by mouth 3 (three) times daily. 90 tablet 2   atorvastatin  (LIPITOR) 40 MG tablet TAKE ONE TABLET BY MOUTH ONCE DAILY. 90 tablet 3   azelastine  (ASTELIN ) 0.1 % nasal spray Place 2 sprays into both nostrils 2 (two) times daily. Use in each nostril as directed 30 mL 12   Blood Glucose Monitoring Suppl DEVI 1 each by Does not apply route in the morning, at noon, and at bedtime. May substitute to any manufacturer covered by patient's insurance. 1 each 0   Calcium  500-125 MG-UNIT TABS Take 1 tablet by mouth daily with supper.     Carboxymethylcellulose Sod PF 0.5 % SOLN Place 1 drop into both eyes daily as needed (dry eyes).     cetirizine (ZYRTEC) 10 MG tablet Take 10 mg by mouth daily.     clotrimazole  (LOTRIMIN  AF) 1 % cream Apply 1 Application topically 2 (two) times daily. 30 g 0   diclofenac  Sodium (VOLTAREN ) 1 % GEL Apply 1 application topically daily as needed (pain).     estradiol  (ESTRACE ) 0.1 MG/GM vaginal cream Use 0.5 gm in vagina at bedtime 2 x weekly 42.5 g 1   ezetimibe   (ZETIA ) 10 MG tablet Take 1 tablet (10 mg total) by mouth daily. 90 tablet 3   famotidine (PEPCID) 20 MG tablet Take 20 mg by mouth 2 (two) times daily. As needed     HYDROcodone -acetaminophen  (NORCO/VICODIN) 5-325 MG tablet Take 1 tablet by mouth every 8 (eight) hours as needed. 30 tablet 0   ipratropium (ATROVENT ) 0.03 % nasal spray Place 2 sprays into both nostrils every 12 (twelve) hours. 30 mL 6   lidocaine -prilocaine  (EMLA ) cream Apply 1 Application topically at bedtime.     lisinopril  (ZESTRIL ) 20 MG tablet Take 1 tablet (20 mg total) by mouth daily. 90 tablet 1   methocarbamol  (ROBAXIN ) 500 MG tablet Take 1 tablet (500 mg total) by mouth every 8 (eight) hours as needed (pain, muscle spasms). 50 tablet 0   methylphenidate  (CONCERTA ) 36 MG PO CR tablet Take 1 tablet (36 mg total) by mouth every morning. 30 tablet 0   mirabegron  ER (MYRBETRIQ ) 50 MG TB24 tablet Take 1 tablet (50 mg total) by mouth daily. 30 tablet 5   montelukast  (SINGULAIR ) 10 MG tablet Take 1 tablet (10 mg total) by mouth at bedtime. 30 tablet 5   Multiple Vitamin (MULITIVITAMIN WITH MINERALS) TABS Take 1 tablet by mouth daily with breakfast.     nystatin  (MYCOSTATIN /NYSTOP ) powder Apply 1 Application topically 3 (three) times daily. 60 g 1   Nystatin  POWD by Does not apply route. As needed.     Olopatadine HCl 0.2 % SOLN Place 1 drop into both eyes in the morning and at bedtime.  Omega-3 Fatty Acids (FISH OIL ) 1000 MG CAPS Take 2,000 mg by mouth.     omeprazole  (PRILOSEC) 40 MG capsule Take 1 capsule (40 mg total) by mouth 2 (two) times daily. 60 capsule 0   OVER THE COUNTER MEDICATION Milk thistle once per day.  Mushrooms once daily.  Vit K 2 once per day.  Beet root gummies daily     prednisoLONE acetate (PRED FORTE) 1 % ophthalmic suspension Put 6 drops in the rinse bottle and flush half on each side. 15 mL 5   pregabalin  (LYRICA ) 25 MG capsule TAKE ONE CAPSULE BY MOUTH EVERY DAY 30 capsule 3   Probiotic  Product (TRUBIOTICS PO) Take 1 capsule by mouth daily. Takes 25 cfu daily.     pyridOXINE (VITAMIN B-6) 100 MG tablet Take 100 mg by mouth daily.     rifaximin  (XIFAXAN ) 550 MG TABS tablet Take 1 tablet (550 mg total) by mouth 3 (three) times daily. 42 tablet 0   rifaximin  (XIFAXAN ) 550 MG TABS tablet 1 PO 3 times a day; Duration: 14 days     traMADol -acetaminophen  (ULTRACET ) 37.5-325 MG tablet Take 1 tablet by mouth every 4 (four) hours as needed. 90 tablet 5   traZODone  (DESYREL ) 100 MG tablet TAKE (2) TABLETS BY MOUTH AT BEDTIME. 60 tablet 2   TYMLOS 3120 MCG/1.56ML SOPN One qhs     venlafaxine  XR (EFFEXOR  XR) 150 MG 24 hr capsule Take 1 capsule (150 mg total) by mouth daily with breakfast. 90 capsule 2   No current facility-administered medications for this visit.     Musculoskeletal: Strength & Muscle Tone: na Gait & Station: na Patient leans: N/A  Psychiatric Specialty Exam: Review of Systems  Constitutional:  Positive for fatigue.  Gastrointestinal:  Positive for abdominal pain.  Musculoskeletal:  Positive for arthralgias and myalgias.  Psychiatric/Behavioral:  Positive for decreased concentration.   All other systems reviewed and are negative.   There were no vitals taken for this visit.There is no height or weight on file to calculate BMI.  General Appearance: na  Eye Contact:  na  Speech:  Clear and Coherent  Volume:  Normal  Mood:  Dysphoric  Affect:  NA  Thought Process:  Goal Directed  Orientation:  Full (Time, Place, and Person)  Thought Content: Rumination   Suicidal Thoughts:  No  Homicidal Thoughts:  No  Memory:  Immediate;   Good Recent;   Fair Remote;   NA  Judgement:  Good  Insight:  Fair  Psychomotor Activity:  Decreased  Concentration:  Concentration: Poor and Attention Span: Poor  Recall:  Fair  Fund of Knowledge: Good  Language: Good  Akathisia:  No  Handed:  Right  AIMS (if indicated): not done  Assets:  Communication Skills Desire for  Improvement Resilience Social Support  ADL's:  Intact  Cognition: WNL  Sleep:  Good   Screenings: AUDIT    Flowsheet Row Clinical Support from 01/06/2021 in [facility]  Alcohol Use Disorder Identification Test Final Score (AUDIT) 4   GAD-7    Flowsheet Row Office Visit from 01/01/2024 in [facility] Office Visit from 07/31/2023 in [facility] Office Visit from 08/29/2022 in [facility]  Total GAD-7 Score 0 0 6   MDI    Flowsheet Row Office Visit from 01/22/2016 in [facility]  Total Score (max 50) 34   Mini-Mental    [employer]  Visit from 02/13/2015 in [facility]  Total Score (max 30 points ) 26   PHQ2-9    Flowsheet Row Office Visit from 01/01/2024 in [facility] Office Visit from 07/31/2023 in [facility] Clinical Support from 04/19/2023 in [facility] Office Visit from 02/27/2023 in [facility] Office Visit from 11/21/2022 in [facility] at [facility]  PHQ-2 Total Score 0 0 2 0 0  PHQ-9 Total Score 0 0 6 -- --   SBQ-R    Flowsheet Row Office Visit from 01/22/2016 in [facility] at [facility] Total Score 14.1   Flowsheet Row ED from 11/25/2023 in [facility] Department at [facility] ED from 09/20/2023 in [facility] Emergency Department at [facility] Admission (Discharged) from 07/21/2023 in [facility]  C-SSRS RISK CATEGORY No Risk No Risk No Risk     Assessment and Plan: This patient is a 63 year old female with a history depression anxiety ADD chronic fatigue chronic pain.  She would again like to try the Concerta  to help with focus so we will send in the 36 mg every morning.  She will continue Effexor  XR  150 mg daily for depression and anxiety, Xanax  1 mg up to 3 times daily for anxiety and trazodone  200 mg at bedtime for sleep.  She will return to see me in 4 weeks  Collaboration of Care: Collaboration of Care: Primary Care Provider AEB notes are shared with PCP on the epic system  Patient/Guardian was advised Release of Information must be obtained prior to any record release in order to collaborate their care with an outside provider. Patient/Guardian was advised if they have not already done so to contact the registration department to sign all necessary forms in order for us  to release information regarding their care.   Consent: Patient/Guardian gives verbal consent for treatment and assignment of benefits for services provided during this visit. Patient/Guardian expressed understanding and agreed to proceed.    Barnie Gull, MD 04/11/2024, 3:21 PM

## 2024-04-17 ENCOUNTER — Encounter (INDEPENDENT_AMBULATORY_CARE_PROVIDER_SITE_OTHER): Payer: Self-pay | Admitting: Gastroenterology

## 2024-04-20 ENCOUNTER — Encounter (INDEPENDENT_AMBULATORY_CARE_PROVIDER_SITE_OTHER): Payer: Self-pay | Admitting: Gastroenterology

## 2024-04-20 ENCOUNTER — Encounter: Payer: Self-pay | Admitting: Internal Medicine

## 2024-04-20 ENCOUNTER — Encounter: Payer: Self-pay | Admitting: Orthopedic Surgery

## 2024-04-22 ENCOUNTER — Ambulatory Visit: Payer: PPO

## 2024-04-22 ENCOUNTER — Ambulatory Visit: Admitting: Orthopedic Surgery

## 2024-04-22 VITALS — BP 140/87 | Ht 63.0 in | Wt 148.8 lb

## 2024-04-22 DIAGNOSIS — Z0000 Encounter for general adult medical examination without abnormal findings: Secondary | ICD-10-CM

## 2024-04-22 NOTE — Progress Notes (Signed)
 Subjective:   [name] is a 63 y.o. who presents for a Medicare Wellness preventive visit.  As a reminder, Annual Wellness Visits don't include a physical exam, and some assessments may be limited, especially if this visit is performed virtually. We may recommend an in-person follow-up visit with your provider if needed.  Visit Complete: Virtual I connected with  [name] on 04/22/24 by a audio enabled telemedicine application and verified that I am speaking with the correct person using two identifiers.  Patient Location: Home  Provider Location: Home Office  I discussed the limitations of evaluation and management by telemedicine. The patient expressed understanding and agreed to proceed.  Vital Signs: Because this visit was a virtual/telehealth visit, some criteria may be missing or patient reported. Any vitals not documented were not able to be obtained and vitals that have been documented are patient reported.  VideoDeclined- This patient declined Librarian, academic. Therefore the visit was completed with audio only.  Persons Participating in Visit: Patient.  AWV Questionnaire: No: Patient Medicare AWV questionnaire was not completed prior to this visit.  Cardiac Risk Factors include: advanced age (>85men, >13 women);dyslipidemia;hypertension;Other (see comment), Risk factor comments: history of hemorrhagic stroke     Objective:    Today's Vitals   04/22/24 0919 04/22/24 0935  BP: (!) 140/87   Weight: 148 lb 12.8 oz (67.5 kg)   Height: 5' 3 (1.6 m)   PainSc: 6  6   PainLoc: Back    Body mass index is 26.36 kg/m.     04/22/2024    9:18 AM 11/25/2023    4:49 PM 09/20/2023    4:31 PM 07/21/2023    8:07 AM 07/07/2023    2:36 PM 04/19/2023    8:30 AM 02/12/2023    3:12 PM  Advanced Directives  Does Patient Have a Medical Advance Directive? No No No No Yes No No  Does patient want to make changes to medical advance directive?      No - Patient declined    Would patient like information on creating a medical advance directive? No - Patient declined  No - Patient declined No - Patient declined  No - Patient declined     Current Medications (verified) Outpatient Encounter Medications as of 04/22/2024  Medication Sig   acetaminophen  (TYLENOL  8 HOUR) 650 MG CR tablet Take 650 mg by mouth every 8 (eight) hours.   ALPRAZolam  (XANAX ) 1 MG tablet Take 1 tablet (1 mg total) by mouth 3 (three) times daily.   atorvastatin  (LIPITOR) 40 MG tablet TAKE ONE TABLET BY MOUTH ONCE DAILY.   azelastine  (ASTELIN ) 0.1 % nasal spray Place 2 sprays into both nostrils 2 (two) times daily. Use in each nostril as directed   Blood Glucose Monitoring Suppl DEVI 1 each by Does not apply route in the morning, at noon, and at bedtime. May substitute to any manufacturer covered by patient's insurance.   Calcium  500-125 MG-UNIT TABS Take 1 tablet by mouth daily with supper.   Carboxymethylcellulose Sod PF 0.5 % SOLN Place 1 drop into both eyes daily as needed (dry eyes).   cetirizine (ZYRTEC) 10 MG tablet Take 10 mg by mouth daily.   clotrimazole  (LOTRIMIN  AF) 1 % cream Apply 1 Application topically 2 (two) times daily.   diclofenac  Sodium (VOLTAREN ) 1 % GEL Apply 1 application topically daily as needed (pain).   estradiol  (ESTRACE ) 0.1 MG/GM vaginal cream Use 0.5 gm in vagina at bedtime 2 x weekly  ezetimibe  (ZETIA ) 10 MG tablet Take 1 tablet (10 mg total) by mouth daily.   famotidine (PEPCID) 20 MG tablet Take 20 mg by mouth 2 (two) times daily. As needed   HYDROcodone -acetaminophen  (NORCO/VICODIN) 5-325 MG tablet Take 1 tablet by mouth every 8 (eight) hours as needed.   ipratropium (ATROVENT ) 0.03 % nasal spray Place 2 sprays into both nostrils every 12 (twelve) hours.   lidocaine -prilocaine  (EMLA ) cream Apply 1 Application topically at bedtime.   lisinopril  (ZESTRIL ) 20 MG tablet Take 1 tablet (20 mg total) by mouth daily.   methocarbamol   (ROBAXIN ) 500 MG tablet Take 1 tablet (500 mg total) by mouth every 8 (eight) hours as needed (pain, muscle spasms).   methylphenidate  (CONCERTA ) 36 MG PO CR tablet Take 1 tablet (36 mg total) by mouth every morning.   mirabegron  ER (MYRBETRIQ ) 50 MG TB24 tablet Take 1 tablet (50 mg total) by mouth daily.   montelukast  (SINGULAIR ) 10 MG tablet Take 1 tablet (10 mg total) by mouth at bedtime.   Multiple Vitamin (MULITIVITAMIN WITH MINERALS) TABS Take 1 tablet by mouth daily with breakfast.   nystatin  (MYCOSTATIN /NYSTOP ) powder Apply 1 Application topically 3 (three) times daily.   Nystatin  POWD by Does not apply route. As needed.   Olopatadine HCl 0.2 % SOLN Place 1 drop into both eyes in the morning and at bedtime.   Omega-3 Fatty Acids (FISH OIL ) 1000 MG CAPS Take 2,000 mg by mouth.   omeprazole  (PRILOSEC) 40 MG capsule Take 1 capsule (40 mg total) by mouth 2 (two) times daily.   OVER THE COUNTER MEDICATION Milk thistle once per day.  Mushrooms once daily.  Vit K 2 once per day.  Beet root gummies daily   prednisoLONE acetate (PRED FORTE) 1 % ophthalmic suspension Put 6 drops in the rinse bottle and flush half on each side.   pregabalin  (LYRICA ) 25 MG capsule TAKE ONE CAPSULE BY MOUTH EVERY DAY   Probiotic Product (TRUBIOTICS PO) Take 1 capsule by mouth daily. Takes 25 cfu daily.   pyridOXINE (VITAMIN B-6) 100 MG tablet Take 100 mg by mouth daily.   rifaximin  (XIFAXAN ) 550 MG TABS tablet Take 1 tablet (550 mg total) by mouth 3 (three) times daily.   rifaximin  (XIFAXAN ) 550 MG TABS tablet 1 PO 3 times a day; Duration: 14 days   traMADol -acetaminophen  (ULTRACET ) 37.5-325 MG tablet Take 1 tablet by mouth every 4 (four) hours as needed.   traZODone  (DESYREL ) 100 MG tablet TAKE (2) TABLETS BY MOUTH AT BEDTIME.   TYMLOS 3120 MCG/1.56ML SOPN One qhs   venlafaxine  XR (EFFEXOR  XR) 150 MG 24 hr capsule Take 1 capsule (150 mg total) by mouth daily with breakfast.   No facility-administered  encounter medications on file as of 04/22/2024.    Allergies (verified) Morphine and codeine and Promethazine hcl   History: Past Medical History:  Diagnosis Date   Allergy    grass, dust , mold   Anxiety    Arthritis    Asthma due to seasonal allergies 06/15/2020   Bipolar disorder (HCC)    Carpal tunnel syndrome    Bilateral   Chest pain 09/2011   Cardiac cath-normal coronaries   Constipation    Depression    Difficulty urinating 05/31/2013   Elevated LFTs 12/16/2013   Encounter for general adult medical examination with abnormal findings 07/07/2021   GERD (gastroesophageal reflux disease)    History of kidney stones    Hyperlipemia    Hyperlipidemia    Hypertension  Mild; provoked by stress and anxiety   IBS (irritable bowel syndrome)    Intracerebral bleed (HCC)    No aneurysm; followed by Dr. Alix Brisker replaced    lense transplant 2022; pt states lense don't dilate or constrict   Loss of weight 01/06/2015   Osteoporosis    Stage 3a chronic kidney disease (HCC) 03/16/2021   Stroke (HCC) 1999   hemorrhagic stroke; weakness of left side   Past Surgical History:  Procedure Laterality Date   BIOPSY  04/16/2021   Procedure: BIOPSY;  Surgeon: Eartha Angelia Sieving, MD;  [facility]: [facility];  Service: Gastroenterology;;  small, bowel, esophageal(proximal and distal);   BIOPSY  07/21/2023   Procedure: BIOPSY;  Surgeon: Eartha Angelia, Sieving, MD;  Location: [facility];  Service: Gastroenterology;;   BRAIN SURGERY  1999   to remove blood clot after stroke    CARDIAC CATHETERIZATION  2016   CERVICAL FUSION     CHOLECYSTECTOMY N/A 10/14/2014   Procedure: LAPAROSCOPIC CHOLECYSTECTOMY WITH INTRAOPERATIVE CHOLANGIOGRAM;  Surgeon: Krystal Russell, MD;  Location: [facility] OR;  Service: General;  Laterality: N/A;   CHONDROPLASTY Right 07/13/2017   Procedure: CHONDROPLASTY of patella;  Surgeon: Margrette Taft BRAVO, MD;  Location: [facility];  Service: Orthopedics;   Laterality: Right;   ESOPHAGEAL DILATION N/A 04/16/2021   Procedure: ESOPHAGEAL DILATION;  Surgeon: Eartha Angelia Sieving, MD;  [facility]: [facility];  Service: Gastroenterology;  Laterality: N/A;   ESOPHAGOGASTRODUODENOSCOPY (EGD) WITH PROPOFOL  N/A 04/16/2021   Procedure: ESOPHAGOGASTRODUODENOSCOPY (EGD) WITH PROPOFOL ;  Surgeon: Eartha Angelia Sieving, MD;  [facility]: [facility];  Service: Gastroenterology;  Laterality: N/A;  1:35, pt knows to arrive at 9:45   ESOPHAGOGASTRODUODENOSCOPY (EGD) WITH PROPOFOL  N/A 07/21/2023   Procedure: ESOPHAGOGASTRODUODENOSCOPY (EGD) WITH PROPOFOL ;  Surgeon: Eartha Angelia Sieving, MD;  [facility];  Service: Gastroenterology;  Laterality: N/A;  9:15AM;ASA 1-2   EUS N/A 08/21/2015   Procedure: ESOPHAGEAL ENDOSCOPIC ULTRASOUND (EUS) RADIAL;  Surgeon: Belvie Just, MD;  Location: [facility];  Service: Endoscopy;  Laterality: N/A;   KNEE ARTHROSCOPY WITH MEDIAL MENISECTOMY Right 07/13/2017   Procedure: KNEE ARTHROSCOPY WITH PARTIAL MEDIAL MENISECTOMY;  Surgeon: Margrette Taft BRAVO, MD;  Location: [facility];  Service: Orthopedics;  Laterality: Right;   LEFT HEART CATHETERIZATION WITH CORONARY ANGIOGRAM N/A 09/23/2011   Procedure: LEFT HEART CATHETERIZATION WITH CORONARY ANGIOGRAM;  Surgeon: Aleene JINNY Passe, MD;  Location: [facility];  Service: Cardiovascular;  Laterality: N/A;   LIPOMA EXCISION Left 11/18/2013   Procedure: EXCISION OF SOFT TISSUE MASS-LEFT THIGH;  Surgeon: Oneil DELENA Budge, MD;  Location: [facility];  Service: General;  Laterality: Left;   NASAL SEPTOPLASTY W/ TURBINOPLASTY Bilateral 08/30/2021   Procedure: NASAL SEPTOPLASTY WITH BILATERAL TURBINATE REDUCTION;  Surgeon: Karis Clunes, MD;  Location: [facility];  Service: ENT;  Laterality: Bilateral;   POLYPECTOMY  04/16/2021   Procedure: POLYPECTOMY;  Surgeon: Eartha Angelia Sieving, MD;  [facility];  Service: Gastroenterology;;  gastric   POLYPECTOMY   07/21/2023   Procedure: POLYPECTOMY;  Surgeon: Eartha Angelia Sieving, MD;  [facility];  Service: Gastroenterology;;   RECTOCELE REPAIR     x2   RECTOCELE REPAIR N/A 04/04/2017   Procedure: POSTERIOR REPAIR (RECTOCELE);  Surgeon: Edsel Norleen GAILS, MD;  Location: [facility];  Service: Gynecology;  Laterality: N/A;   SUBMUCOSAL LIFTING INJECTION  07/21/2023   Procedure: SUBMUCOSAL LIFTING INJECTION;  Surgeon: Eartha Angelia Sieving, MD;  [facility];  Service: Gastroenterology;;  TOTAL ABDOMINAL HYSTERECTOMY     Family History  Problem Relation Age of Onset   Cancer Mother        breast    Hypertension Mother    Hyperlipidemia Mother    Depression Mother    Anxiety disorder Mother    COPD Mother    Arthritis Mother        rheumatoid   Drug abuse Sister    Coronary artery disease Paternal Grandfather    Coronary artery disease Paternal Uncle    Depression Cousin    Drug abuse Cousin    Social History   Socioeconomic History   Marital status: Married    Spouse name: Christopher   Number of children: 0   Years of education: HS   Highest education level: Not on file  Occupational History   Occupation: unemployed    Comment: pending disability  Tobacco Use   Smoking status: Former    Current packs/day: 0.00    Average packs/day: 1 pack/day for 19.0 years (19.0 ttl pk-yrs)    Types: Cigarettes    Start date: 09/02/1978    Quit date: 09/01/1997    Years since quitting: 26.6    Passive exposure: Past   Smokeless tobacco: Never   Tobacco comments:    Quit smoking 1999 , previous 20 pack years  Vaping Use   Vaping status: Never Used  Substance and Sexual Activity   Alcohol use: Yes    Comment: 1 drink every other week   Drug use: No   Sexual activity: Not Currently    Partners: Male    Birth control/protection: Surgical    Comment: hyst   Other Topics Concern   Not on file  Social History Narrative   Currently unable to work   Lives in [place]    Married   Patient drinks 1 cup of caffeine daily.   Patient is right handed.    [facility]ined [facility] [employer] to get more [facility]   Social Drivers of Health   Financial Resource Strain: Low Risk  (04/22/2024)   Overall Financial Resource Strain (CARDIA)    Difficulty of Paying Living Expenses: Not hard at all  Food Insecurity: No Food Insecurity (04/22/2024)   Hunger Vital Sign    Worried About Running Out of Food in the Last Year: Never true    Ran Out of Food in the Last Year: Never true  Transportation Needs: No Transportation Needs (04/22/2024)   PRAPARE - Administrator, Civil Service (Medical): No    Lack of Transportation (Non-Medical): No  Physical Activity: Sufficiently Active (04/22/2024)   Exercise Vital Sign    Days of Exercise per Week: 7 days    Minutes of Exercise per Session: 30 min  Stress: No Stress Concern Present (04/22/2024)   [employer] of Occupational Health - Occupational Stress Questionnaire    Feeling of Stress: Only a little  Social Connections: Moderately Isolated (04/22/2024)   Social Connection and Isolation Panel    Frequency of Communication with Friends and Family: Twice a week    Frequency of Social Gatherings with Friends and Family: Once a week    Attends Religious Services: Never    Database administrator or Organizations: No    Attends Engineer, structural: Never    Marital Status: Married    Tobacco Counseling Counseling given: Yes Tobacco comments: Quit smoking 1999 , previous 20 pack years    Clinical Intake:  Pre-visit preparation completed: Yes  Pain :  0-10 Pain Score: 6  Pain Type: Chronic pain Pain Location: Back Pain Orientation: Lower Pain Descriptors / Indicators: Constant Pain Onset: More than a month ago Pain Frequency: Constant     BMI - recorded: 26.36 Nutritional Status: BMI 25 -29 Overweight Nutritional Risks: None Diabetes: No  Lab Results  Component Value Date   HGBA1C 5.5 07/31/2023    HGBA1C 5.7 (H) 02/10/2023   HGBA1C 5.8 (H) 08/25/2022     How often do you need to have someone help you when you read instructions, pamphlets, or other written materials from your doctor or pharmacy?: 1 - Never  Interpreter Needed?: No  Information entered by :: Juriel Cid W CMA (AAMA)   Activities of Daily Living     04/22/2024   10:13 AM  In your present state of health, do you have any difficulty performing the following activities:  Hearing? 0  Vision? 0  Difficulty concentrating or making decisions? 0  Walking or climbing stairs? 1  Dressing or bathing? 0  Doing errands, shopping? 1  Preparing Food and eating ? N  Using the Toilet? N  In the past six months, have you accidently leaked urine? N  Do you have problems with loss of bowel control? N  Managing your Medications? N  Managing your Finances? N  Housekeeping or managing your Housekeeping? Y  Comment husband helps her.    Patient Care Team: [name], MD as PCP - General (Internal Medicine) Margrette Taft BRAVO, MD as Consulting Physician (Orthopedic Surgery) [facility] Eartha Angelia Sieving, MD as Consulting Physician (Gastroenterology) Georgina Ozell LABOR, MD as Consulting Physician (Orthopedic Surgery) Signa Delon LABOR, NP as Nurse Practitioner (Obstetrics and Gynecology) Okey Barnie SAUNDERS, MD as Consulting Physician [facility]) Harden Jerona GAILS, MD as Consulting Physician (Orthopedic Surgery)  I have updated your Care Teams any recent Medical Services you may have received from other providers in the past year.     Assessment:   This is a routine wellness examination for Alaney.  Hearing/Vision screen Hearing Screening - Comments:: Patient denies any hearing difficulties.   Vision Screening - Comments:: Wears rx glasses - up to date with routine eye exams with  Dr. Octavia   Goals Addressed               This Visit's Progress     Get back in shape and get my bones  healthy again (pt-stated)          Depression Screen     04/22/2024   10:16 AM 01/01/2024    2:37 PM 07/31/2023    1:53 PM 04/19/2023    8:35 AM 02/27/2023    3:14 PM 11/21/2022    3:45 PM 08/29/2022    4:27 PM  PHQ 2/9 Scores  PHQ - 2 Score 0 0 0 2 0 0 2  PHQ- 9 Score 0 0 0 6   5     Fall Risk     04/22/2024   10:12 AM 01/01/2024    2:36 PM 07/31/2023    2:05 PM 07/31/2023    2:04 PM 07/31/2023    1:53 PM  Fall Risk   Falls in the past year? 0 0 1 0 0  Number falls in past yr: 0 0 0 0 0  Injury with Fall? 1 0 1 0 0  Risk for fall due to : History of fall(s);Impaired balance/gait;Impaired mobility;Orthopedic patient No Fall Risks No Fall Risks No Fall Risks No Fall Risks  Follow  up Falls evaluation completed;Education provided;Falls prevention discussed Falls evaluation completed Falls evaluation completed Falls evaluation completed Falls evaluation completed    MEDICARE RISK AT HOME:  Medicare Risk at Home Any stairs in or around the home?: Yes If so, are there any without handrails?: No Home free of loose throw rugs in walkways, pet beds, electrical cords, etc?: Yes Adequate lighting in your home to reduce risk of falls?: Yes Life alert?: No Use of a cane, walker or w/c?: No Grab bars in the bathroom?: No Shower chair or bench in shower?: No Elevated toilet seat or a handicapped toilet?: No  TIMED UP AND GO:  Was the test performed?  No  Cognitive Function: 6CIT completed    03/29/2022    4:10 PM 02/13/2015    8:33 AM  MMSE - Mini Mental State Exam  Not completed: Unable to complete   Orientation to time  4   Orientation to Place  5   Registration  2   Attention/ Calculation  5   Recall  2   Language- name 2 objects  2   Language- repeat  1  Language- follow 3 step command  3   Language- read & follow direction  1   Write a sentence  1   Copy design  0   Total score  26      Data saved with a previous flowsheet row definition        04/22/2024    10:14 AM 04/19/2023    8:35 AM 03/29/2022    4:10 PM  6CIT Screen  What Year? 0 points 0 points 0 points  What month? 0 points 0 points 0 points  What time? 0 points 0 points 0 points  Count back from 20 0 points 0 points 0 points  Months in reverse 0 points 0 points 0 points  Repeat phrase 0 points 0 points 0 points  Total Score 0 points 0 points 0 points    Immunizations Immunization History  Administered Date(s) Administered   Influenza Split 08/13/2012, 05/11/2013   Influenza, Quadrivalent, Recombinant, Inj, Pf 03/17/2022   Influenza, Seasonal, Injecte, Preservative Fre 02/27/2023   Influenza,inj,Quad PF,6+ Mos 05/11/2015, 03/24/2019, 05/10/2021, 03/23/2024   Influenza-Unspecified 05/10/2014, 04/17/2017, 03/16/2020   Moderna Covid-19 Vaccine Bivalent Booster 41yrs & up 03/24/2023   PFIZER(Purple Top)SARS-COV-2 Vaccination 09/26/2019, 10/19/2019, 08/12/2020   PNEUMOCOCCAL CONJUGATE-20 07/07/2021   Pfizer Covid-19 Vaccine Bivalent Booster 51yrs & up 05/12/2021   Tdap 04/30/2008, 02/12/2013, 03/24/2023   Zoster Recombinant(Shingrix) 03/17/2022, 08/25/2022    Screening Tests Health Maintenance  Topic Date Due   COVID-19 Vaccine (6 - 2025-26 season) 03/04/2024   Mammogram  02/15/2025   Medicare Annual Wellness (AWV)  04/22/2025   Colonoscopy  08/22/2028   DTaP/Tdap/Td (4 - Td or Tdap) 03/23/2033   Pneumococcal Vaccine: 50+ Years  Completed   Influenza Vaccine  Completed   Hepatitis C Screening  Completed   HIV Screening  Completed   Zoster Vaccines- Shingrix  Completed   Hepatitis B Vaccines 19-59 Average Risk  Aged Out   HPV VACCINES  Aged Out   Meningococcal B Vaccine  Aged Out    Health Maintenance Health Maintenance Due  Topic Date Due   COVID-19 Vaccine (6 - 2025-26 season) 03/04/2024   Health Maintenance Items Addressed: Routine health screenings are up to date. Patient is aware of recommended vaccines.   Additional Screening:  Vision Screening:  Recommended annual ophthalmology exams for early detection of glaucoma and other disorders of the eye.  Would you like a referral to an eye doctor? No    Dental Screening: Recommended annual dental exams for proper oral hygiene  Community Resource Referral / Chronic Care Management: CRR required this visit?  No   CCM required this visit?  No   Plan:    I have personally reviewed and noted the following in the patient's chart:   Medical and social history Use of alcohol, tobacco or illicit drugs  Current medications and supplements including opioid prescriptions. Patient is currently taking opioid prescriptions. Information provided to patient regarding non-opioid alternatives. Patient advised to discuss non-opioid treatment plan with their provider. Functional ability and status Nutritional status Physical activity Advanced directives List of other physicians Hospitalizations, surgeries, and ER visits in previous 12 months Vitals Screenings to include cognitive, depression, and falls Referrals and appointments  In addition, I have reviewed and discussed with patient certain preventive protocols, quality metrics, and best practice recommendations. A written personalized care plan for preventive services as well as general preventive health recommendations were provided to patient.   Saima Monterroso, CMA   04/22/2024   After Visit Summary: (MyChart) Due to this being a telephonic visit, the after visit summary with patients personalized plan was offered to patient via MyChart   Notes: Nothing significant to report at this time.

## 2024-04-22 NOTE — Patient Instructions (Signed)
 [name],  Thank you for taking the time for your Medicare Wellness Visit. I appreciate your continued commitment to your health goals. Please review the care plan we discussed, and feel free to reach out if I can assist you further.  Medicare recommends these wellness visits once per year to help you and your care team stay ahead of potential health issues. These visits are designed to focus on prevention, allowing your provider to concentrate on managing your acute and chronic conditions during your regular appointments.  Please note that Annual Wellness Visits do not include a physical exam. Some assessments may be limited, especially if the visit was conducted virtually. If needed, we may recommend a separate in-person follow-up with your provider.  Wishing you excellent health and many blessings in the year to come!  -Kateleen Encarnacion, CMA  Ongoing Care Seeing your primary care provider every 3 to 6 months helps us  monitor your health and provide consistent, personalized care.   Recommended Screenings:  Health Maintenance  Topic Date Due   COVID-19 Vaccine (6 - 2025-26 season) 03/04/2024   Breast Cancer Screening  02/15/2025   Medicare Annual Wellness Visit  04/22/2025   Colon Cancer Screening  08/22/2028   DTaP/Tdap/Td vaccine (4 - Td or Tdap) 03/23/2033   Pneumococcal Vaccine for age over 73  Completed   Flu Shot  Completed   Hepatitis C Screening  Completed   HIV Screening  Completed   Zoster (Shingles) Vaccine  Completed   Hepatitis B Vaccine  Aged Out   HPV Vaccine  Aged Out   Meningitis B Vaccine  Aged Out       04/22/2024    9:18 AM  Advanced Directives  Does Patient Have a Medical Advance Directive? No  Would patient like information on creating a medical advance directive? No - Patient declined   Advance Care Planning is important because it: Ensures you receive medical care that aligns with your values, goals, and preferences. Provides guidance to your family and loved  ones, reducing the emotional burden of decision-making during critical moments.  Vision: Annual vision screenings are recommended for early detection of glaucoma, cataracts, and diabetic retinopathy. These exams can also reveal signs of chronic conditions such as diabetes and high blood pressure.  Dental: Annual dental screenings help detect early signs of oral cancer, gum disease, and other conditions linked to overall health, including heart disease and diabetes.  Please see the attached documents for additional preventive care recommendations.

## 2024-04-30 ENCOUNTER — Institutional Professional Consult (permissible substitution) (INDEPENDENT_AMBULATORY_CARE_PROVIDER_SITE_OTHER): Admitting: Otolaryngology

## 2024-05-06 ENCOUNTER — Encounter: Payer: Self-pay | Admitting: Radiology

## 2024-05-09 ENCOUNTER — Encounter (HOSPITAL_COMMUNITY): Payer: Self-pay | Admitting: Psychiatry

## 2024-05-09 ENCOUNTER — Telehealth (INDEPENDENT_AMBULATORY_CARE_PROVIDER_SITE_OTHER): Admitting: Psychiatry

## 2024-05-09 DIAGNOSIS — R413 Other amnesia: Secondary | ICD-10-CM

## 2024-05-09 DIAGNOSIS — F332 Major depressive disorder, recurrent severe without psychotic features: Secondary | ICD-10-CM

## 2024-05-09 DIAGNOSIS — F988 Other specified behavioral and emotional disorders with onset usually occurring in childhood and adolescence: Secondary | ICD-10-CM | POA: Diagnosis not present

## 2024-05-09 MED ORDER — VENLAFAXINE HCL ER 150 MG PO CP24: 150.0000 mg | ORAL_CAPSULE | Freq: Every day | ORAL | 2 refills | Status: DC

## 2024-05-09 MED ORDER — ALPRAZOLAM 1 MG PO TABS: 1.0000 mg | ORAL_TABLET | Freq: Three times a day (TID) | ORAL | 2 refills | Status: DC

## 2024-05-09 MED ORDER — METHYLPHENIDATE HCL 10 MG PO TABS: 10.0000 mg | ORAL_TABLET | Freq: Two times a day (BID) | ORAL | 0 refills | Status: DC

## 2024-05-09 MED ORDER — TRAZODONE HCL 100 MG PO TABS: ORAL_TABLET | ORAL | 2 refills | Status: DC

## 2024-05-09 NOTE — Progress Notes (Signed)
 Virtual Visit via Telephone Note  I connected with [name] on 05/09/24 at  3:20 PM EST by telephone and verified that I am speaking with the correct person using two identifiers.  Location: Patient: home Provider: office   I discussed the limitations, risks, security and privacy concerns of performing an evaluation and management service by telephone and the availability of in person appointments. I also discussed with the patient that there may be a patient responsible charge related to this service. The patient expressed understanding and agreed to proceed.     I discussed the assessment and treatment plan with the patient. The patient was provided an opportunity to ask questions and all were answered. The patient agreed with the plan and demonstrated an understanding of the instructions.   The patient was advised to call back or seek an in-person evaluation if the symptoms worsen or if the condition fails to improve as anticipated.  I provided 20 minutes of non-face-to-face time during this encounter.   Barnie Gull, MD  [facility] MD/PA/NP OP Progress Note  05/09/2024 3:50 PM [name]  MRN:  [id number]  Chief Complaint:  Chief Complaint  Patient presents with   ADD   Depression   Anxiety   Follow-up   HPI: This patient is a 63 year old married white female lives with her husband in [place]. She has no children. 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  Last time we added Concerta  to help with her fatigue and memory problems but she states it keeps her awake at night.  She would like to go back to the regular methylphenidate .  As usual she has numerous somatic complaints about her back or headaches or stomach excetra.  She is working with various specialist on all  of these things.  She is somewhat depressed but I think it is more related to her medical issues at this point. Visit Diagnosis:    ICD-10-CM   1. Major depressive disorder, recurrent, severe without psychotic features (HCC)  F33.2     2. ADD (attention deficit disorder) without hyperactivity  F98.8     3. Memory deficit  R41.3       Past Psychiatric History: Past outpatient treatment for depression  Past Medical History:  Past Medical History:  Diagnosis Date   Allergy    grass, dust , mold   Anxiety    Arthritis    Asthma due to seasonal allergies 06/15/2020   Bipolar disorder (HCC)    Carpal tunnel syndrome    Bilateral   Chest pain 09/2011   Cardiac cath-normal coronaries   Constipation    Depression    Difficulty urinating 05/31/2013   Elevated LFTs 12/16/2013   Encounter for general adult medical examination with abnormal findings 07/07/2021   GERD (gastroesophageal reflux disease)    History of kidney stones    Hyperlipemia    Hyperlipidemia    Hypertension    Mild; provoked by stress and anxiety   IBS (irritable bowel syndrome)    Intracerebral bleed (HCC)    No aneurysm; followed by Dr. Alix Brisker replaced    lense transplant 2022; pt states lense don't dilate or constrict   Loss of weight 01/06/2015   Osteoporosis    Stage 3a chronic kidney disease (HCC) 03/16/2021  Stroke [facility]) 1999   hemorrhagic stroke; weakness of left side    Past Surgical History:  Procedure Laterality Date   BIOPSY  04/16/2021   Procedure: BIOPSY;  Surgeon: Eartha Angelia Sieving, MD;  [facility];  Service: Gastroenterology;;  small, bowel, esophageal(proximal and distal);   BIOPSY  07/21/2023   Procedure: BIOPSY;  Surgeon: Eartha Angelia, Sieving, MD;  [facility];  Service: Gastroenterology;;   BRAIN SURGERY  1999   to remove blood clot after stroke    CARDIAC CATHETERIZATION  2016   CERVICAL FUSION     CHOLECYSTECTOMY N/A 10/14/2014    Procedure: LAPAROSCOPIC CHOLECYSTECTOMY WITH INTRAOPERATIVE CHOLANGIOGRAM;  Surgeon: Krystal Russell, MD;  Location: [facility] OR;  Service: General;  Laterality: N/A;   CHONDROPLASTY Right 07/13/2017   Procedure: CHONDROPLASTY of patella;  Surgeon: Margrette Taft BRAVO, MD;  Location: [facility];  Service: Orthopedics;  Laterality: Right;   ESOPHAGEAL DILATION N/A 04/16/2021   Procedure: ESOPHAGEAL DILATION;  Surgeon: Eartha Angelia Sieving, MD;  [facility];  Service: Gastroenterology;  Laterality: N/A;   ESOPHAGOGASTRODUODENOSCOPY (EGD) WITH PROPOFOL  N/A 04/16/2021   Procedure: ESOPHAGOGASTRODUODENOSCOPY (EGD) WITH PROPOFOL ;  Surgeon: Eartha Angelia Sieving, MD;  [facility];  Service: Gastroenterology;  Laterality: N/A;  1:35, pt knows to arrive at 9:45   ESOPHAGOGASTRODUODENOSCOPY (EGD) WITH PROPOFOL  N/A 07/21/2023   Procedure: ESOPHAGOGASTRODUODENOSCOPY (EGD) WITH PROPOFOL ;  Surgeon: Eartha Angelia Sieving, MD;  [facility];  Service: Gastroenterology;  Laterality: N/A;  9:15AM;ASA 1-2   EUS N/A 08/21/2015   Procedure: ESOPHAGEAL ENDOSCOPIC ULTRASOUND (EUS) RADIAL;  Surgeon: Belvie Just, MD;  [facility];  Service: Endoscopy;  Laterality: N/A;   KNEE ARTHROSCOPY WITH MEDIAL MENISECTOMY Right 07/13/2017   Procedure: KNEE ARTHROSCOPY WITH PARTIAL MEDIAL MENISECTOMY;  Surgeon: Margrette Taft BRAVO, MD;  Location: [facility];  Service: Orthopedics;  Laterality: Right;   LEFT HEART CATHETERIZATION WITH CORONARY ANGIOGRAM N/A 09/23/2011   Procedure: LEFT HEART CATHETERIZATION WITH CORONARY ANGIOGRAM;  Surgeon: Aleene JINNY Passe, MD;  Location: [facility] CATH LAB;  Service: Cardiovascular;  Laterality: N/A;   LIPOMA EXCISION Left 11/18/2013   Procedure: EXCISION OF SOFT TISSUE MASS-LEFT THIGH;  Surgeon: Oneil DELENA Budge, MD;  Location: [facility];  Service: General;  Laterality: Left;   NASAL SEPTOPLASTY W/ TURBINOPLASTY Bilateral 08/30/2021   Procedure: NASAL SEPTOPLASTY  WITH BILATERAL TURBINATE REDUCTION;  Surgeon: Karis Clunes, MD;  Location: [facility];  Service: ENT;  Laterality: Bilateral;   POLYPECTOMY  04/16/2021   Procedure: POLYPECTOMY;  Surgeon: Eartha Angelia Sieving, MD;  [facility];  Service: Gastroenterology;;  gastric   POLYPECTOMY  07/21/2023   Procedure: POLYPECTOMY;  Surgeon: Eartha Angelia Sieving, MD;  [facility];  Service: Gastroenterology;;   RECTOCELE REPAIR     x2   RECTOCELE REPAIR N/A 04/04/2017   Procedure: POSTERIOR REPAIR (RECTOCELE);  Surgeon: Edsel Norleen GAILS, MD;  Location: [facility];  Service: Gynecology;  Laterality: N/A;   SUBMUCOSAL LIFTING INJECTION  07/21/2023   Procedure: SUBMUCOSAL LIFTING INJECTION;  Surgeon: Eartha Angelia Sieving, MD;  [facility];  Service: Gastroenterology;;   TOTAL ABDOMINAL HYSTERECTOMY      Family Psychiatric History: See below  Family History:  Family History  Problem Relation Age of Onset   Cancer Mother        breast    Hypertension Mother    Hyperlipidemia Mother    Depression Mother    Anxiety disorder Mother    COPD Mother  Arthritis Mother        rheumatoid   Drug abuse Sister    Coronary artery disease Paternal Grandfather    Coronary artery disease Paternal Uncle    Depression Cousin    Drug abuse Cousin     Social History:  Social History   Socioeconomic History   Marital status: Married    Spouse name: Christopher   Number of children: 0   Years of education: HS   Highest education level: Not on file  Occupational History   Occupation: unemployed    Comment: pending disability  Tobacco Use   Smoking status: Former    Current packs/day: 0.00    Average packs/day: 1 pack/day for 19.0 years (19.0 ttl pk-yrs)    Types: Cigarettes    Start date: 09/02/1978    Quit date: 09/01/1997    Years since quitting: 26.7    Passive exposure: Past   Smokeless tobacco: Never   Tobacco comments:    Quit smoking 1999 ,  previous 20 pack years  Vaping Use   Vaping status: Never Used  Substance and Sexual Activity   Alcohol use: Yes    Comment: 1 drink every other week   Drug use: No   Sexual activity: Not Currently    Partners: Male    Birth control/protection: Surgical    Comment: hyst   Other Topics Concern   Not on file  Social History Narrative   Currently unable to work   Lives in [place]   Married   Patient drinks 1 cup of caffeine daily.   Patient is right handed.    [employer]ined the [employer] to get more exercise   Social Drivers of Health   Financial Resource Strain: Low Risk  (04/22/2024)   Overall Financial Resource Strain (CARDIA)    Difficulty of Paying Living Expenses: Not hard at all  Food Insecurity: No Food Insecurity (04/22/2024)   Hunger Vital Sign    Worried About Running Out of Food in the Last Year: Never true    Ran Out of Food in the Last Year: Never true  Transportation Needs: No Transportation Needs (04/22/2024)   PRAPARE - Administrator, Civil Service (Medical): No    Lack of Transportation (Non-Medical): No  Physical Activity: Sufficiently Active (04/22/2024)   Exercise Vital Sign    Days of Exercise per Week: 7 days    Minutes of Exercise per Session: 30 min  Stress: No Stress Concern Present (04/22/2024)   [employer] of Occupational Health - Occupational Stress Questionnaire    Feeling of Stress: Only a little  Social Connections: Moderately Isolated (04/22/2024)   Social Connection and Isolation Panel    Frequency of Communication with Friends and Family: Twice a week    Frequency of Social Gatherings with Friends and Family: Once a week    Attends Religious Services: Never    Database Administrator or Organizations: No    Attends Banker Meetings: Never    Marital Status: Married    Allergies:  Allergies  Allergen Reactions   Morphine And Codeine Hives   Promethazine Hcl Other (See Comments)    Causes patient to become Hyper     Metabolic Disorder Labs: Lab Results  Component Value Date   HGBA1C 5.5 07/31/2023   MPG 117 (H) 01/06/2015   MPG 117 (H) 02/17/2014   No results found for: PROLACTIN Lab Results  Component Value Date   CHOL 124 12/22/2023   TRIG 152 (H)  12/22/2023   HDL 51 12/22/2023   CHOLHDL 2.4 12/22/2023   VLDL 26 09/16/2015   LDLCALC 47 12/22/2023   LDLCALC 58 02/10/2023   Lab Results  Component Value Date   TSH 1.100 12/22/2023   TSH 1.410 02/10/2023    Therapeutic Level Labs: No results found for: LITHIUM No results found for: VALPROATE No results found for: CBMZ  Current Medications: Current Outpatient Medications  Medication Sig Dispense Refill   methylphenidate  (RITALIN ) 10 MG tablet Take 1 tablet (10 mg total) by mouth 2 (two) times daily with breakfast and lunch. 60 tablet 0   methylphenidate  (RITALIN ) 10 MG tablet Take 1 tablet (10 mg total) by mouth 2 (two) times daily with breakfast and lunch. 60 tablet 0   acetaminophen  (TYLENOL  8 HOUR) 650 MG CR tablet Take 650 mg by mouth every 8 (eight) hours.     ALPRAZolam  (XANAX ) 1 MG tablet Take 1 tablet (1 mg total) by mouth 3 (three) times daily. 90 tablet 2   atorvastatin  (LIPITOR) 40 MG tablet TAKE ONE TABLET BY MOUTH ONCE DAILY. 90 tablet 3   azelastine  (ASTELIN ) 0.1 % nasal spray Place 2 sprays into both nostrils 2 (two) times daily. Use in each nostril as directed 30 mL 12   Blood Glucose Monitoring Suppl DEVI 1 each by Does not apply route in the morning, at noon, and at bedtime. May substitute to any manufacturer covered by patient's insurance. 1 each 0   Calcium  500-125 MG-UNIT TABS Take 1 tablet by mouth daily with supper.     Carboxymethylcellulose Sod PF 0.5 % SOLN Place 1 drop into both eyes daily as needed (dry eyes).     cetirizine (ZYRTEC) 10 MG tablet Take 10 mg by mouth daily.     clotrimazole  (LOTRIMIN  AF) 1 % cream Apply 1 Application topically 2 (two) times daily. 30 g 0   diclofenac  Sodium  (VOLTAREN ) 1 % GEL Apply 1 application topically daily as needed (pain).     estradiol  (ESTRACE ) 0.1 MG/GM vaginal cream Use 0.5 gm in vagina at bedtime 2 x weekly 42.5 g 1   ezetimibe  (ZETIA ) 10 MG tablet Take 1 tablet (10 mg total) by mouth daily. 90 tablet 3   famotidine (PEPCID) 20 MG tablet Take 20 mg by mouth 2 (two) times daily. As needed     HYDROcodone -acetaminophen  (NORCO/VICODIN) 5-325 MG tablet Take 1 tablet by mouth every 8 (eight) hours as needed. 30 tablet 0   ipratropium (ATROVENT ) 0.03 % nasal spray Place 2 sprays into both nostrils every 12 (twelve) hours. 30 mL 6   lidocaine -prilocaine  (EMLA ) cream Apply 1 Application topically at bedtime.     lisinopril  (ZESTRIL ) 20 MG tablet Take 1 tablet (20 mg total) by mouth daily. 90 tablet 1   methocarbamol  (ROBAXIN ) 500 MG tablet Take 1 tablet (500 mg total) by mouth every 8 (eight) hours as needed (pain, muscle spasms). 50 tablet 0   methylphenidate  (CONCERTA ) 36 MG PO CR tablet Take 1 tablet (36 mg total) by mouth every morning. 30 tablet 0   mirabegron  ER (MYRBETRIQ ) 50 MG TB24 tablet Take 1 tablet (50 mg total) by mouth daily. 30 tablet 5   montelukast  (SINGULAIR ) 10 MG tablet Take 1 tablet (10 mg total) by mouth at bedtime. 30 tablet 5   Multiple Vitamin (MULITIVITAMIN WITH MINERALS) TABS Take 1 tablet by mouth daily with breakfast.     nystatin  (MYCOSTATIN /NYSTOP ) powder Apply 1 Application topically 3 (three) times daily. 60 g 1   Nystatin   POWD by Does not apply route. As needed.     Olopatadine HCl 0.2 % SOLN Place 1 drop into both eyes in the morning and at bedtime.     Omega-3 Fatty Acids (FISH OIL ) 1000 MG CAPS Take 2,000 mg by mouth.     omeprazole  (PRILOSEC) 40 MG capsule Take 1 capsule (40 mg total) by mouth 2 (two) times daily. 60 capsule 0   OVER THE COUNTER MEDICATION Milk thistle once per day.  Mushrooms once daily.  Vit K 2 once per day.  Beet root gummies daily     prednisoLONE acetate (PRED FORTE) 1 %  ophthalmic suspension Put 6 drops in the rinse bottle and flush half on each side. 15 mL 5   pregabalin  (LYRICA ) 25 MG capsule TAKE ONE CAPSULE BY MOUTH EVERY DAY 30 capsule 3   Probiotic Product (TRUBIOTICS PO) Take 1 capsule by mouth daily. Takes 25 cfu daily.     pyridOXINE (VITAMIN B-6) 100 MG tablet Take 100 mg by mouth daily.     rifaximin  (XIFAXAN ) 550 MG TABS tablet Take 1 tablet (550 mg total) by mouth 3 (three) times daily. 42 tablet 0   rifaximin  (XIFAXAN ) 550 MG TABS tablet 1 PO 3 times a day; Duration: 14 days     traMADol -acetaminophen  (ULTRACET ) 37.5-325 MG tablet Take 1 tablet by mouth every 4 (four) hours as needed. 90 tablet 5   traZODone  (DESYREL ) 100 MG tablet TAKE (2) TABLETS BY MOUTH AT BEDTIME. 60 tablet 2   TYMLOS 3120 MCG/1.56ML SOPN One qhs     venlafaxine  XR (EFFEXOR  XR) 150 MG 24 hr capsule Take 1 capsule (150 mg total) by mouth daily with breakfast. 90 capsule 2   No current facility-administered medications for this visit.     Musculoskeletal: Strength & Muscle Tone: na Gait & Station: na Patient leans: N/A  Psychiatric Specialty Exam: Review of Systems  Constitutional:  Positive for activity change.  Gastrointestinal:  Positive for abdominal pain.  Musculoskeletal:  Positive for arthralgias and back pain.  Neurological:  Positive for headaches.  Psychiatric/Behavioral:  Positive for decreased concentration.   All other systems reviewed and are negative.   There were no vitals taken for this visit.There is no height or weight on file to calculate BMI.  General Appearance: NA  Eye Contact:  NA  Speech:  Clear and Coherent  Volume:  Normal  Mood:  Dysphoric  Affect:  NA  Thought Process:  Goal Directed  Orientation:  Full (Time, Place, and Person)  Thought Content: Rumination   Suicidal Thoughts:  No  Homicidal Thoughts:  No  Memory:  Immediate;   Good Recent;   Fair Remote;   NA  Judgement:  Fair  Insight:  Fair  Psychomotor Activity:   Decreased  Concentration:  Concentration: Poor and Attention Span: Poor  Recall:  Fair  Fund of Knowledge: Good  Language: Good  Akathisia:  No  Handed:  Right  AIMS (if indicated): not done  Assets:  Communication Skills Desire for Improvement Resilience Social Support  ADL's:  Intact  Cognition: WNL  Sleep:  Fair   Screenings: AUDIT    Flowsheet Row Clinical Support from 01/06/2021 in [facility]  Alcohol Use Disorder Identification Test Final Score (AUDIT) 4   GAD-7    Flowsheet Row Office Visit from 01/01/2024 in [facility] Office Visit from 07/31/2023 in [facility] Office Visit from 08/29/2022 in [facility]  Total GAD-7 Score 0 0 6   MDI    Flowsheet Row Office Visit from 01/22/2016 in [facility]  Total Score (max 50) 34   Mini-Mental    Flowsheet Row Office Visit from 02/13/2015 in [facility]  Total Score (max 30 points ) 26   PHQ2-9    Flowsheet Row Clinical Support from 04/22/2024 in [facility] Office Visit from 01/01/2024 in [facility] Office Visit from 07/31/2023 in [facility] Clinical Support from 04/19/2023 in [facility] Office Visit from 02/27/2023 in [facility]  PHQ-2 Total Score 0 0 0 2 0  PHQ-9 Total Score 0 0 0 6 --   SBQ-R    Flowsheet Row Office Visit from 01/22/2016 in [facility] [place] Total Score 14.1   Flowsheet Row ED from 11/25/2023 in [facility] Emergency Department at [facility] ED from 09/20/2023 in [facility] Emergency Department at [facility] Admission (Discharged) from 07/21/2023 in [facility]  C-SSRS RISK CATEGORY No Risk No Risk No Risk     Assessment and Plan: This patient is a  63 year old female with a history of major depression generalized anxiety ADD chronic fatigue and chronic pain.  We will try methylphenidate  10 mg twice daily for the ADD.  She will continue Effexor  XR 150 mg daily for major depression, Xanax  1 mg up to 3 times daily for anxiety and trazodone  200 mg at bedtime for sleep.  She will return to see me in 2 months  Collaboration of Care: Collaboration of Care: Primary Care Provider AEB notes are shared with PCP on the epic system  Patient/Guardian was advised Release of Information must be obtained prior to any record release in order to collaborate their care with an outside provider. Patient/Guardian was advised if they have not already done so to contact the registration department to sign all necessary forms in order for us  to release information regarding their care.   Consent: Patient/Guardian gives verbal consent for treatment and assignment of benefits for services provided during this visit. Patient/Guardian expressed understanding and agreed to proceed.    Barnie Gull, MD 05/09/2024, 3:50 PM

## 2024-05-10 ENCOUNTER — Encounter: Payer: Self-pay | Admitting: Diagnostic Neuroimaging

## 2024-05-10 ENCOUNTER — Encounter: Payer: Self-pay | Admitting: Orthopedic Surgery

## 2024-05-14 ENCOUNTER — Other Ambulatory Visit: Payer: Self-pay | Admitting: Orthopedic Surgery

## 2024-05-15 ENCOUNTER — Encounter: Payer: Self-pay | Admitting: Diagnostic Neuroimaging

## 2024-05-15 ENCOUNTER — Ambulatory Visit: Admitting: Diagnostic Neuroimaging

## 2024-05-15 ENCOUNTER — Telehealth: Payer: Self-pay | Admitting: Diagnostic Neuroimaging

## 2024-05-15 VITALS — BP 135/89 | HR 71 | Ht 63.0 in | Wt 151.0 lb

## 2024-05-15 DIAGNOSIS — R0683 Snoring: Secondary | ICD-10-CM | POA: Diagnosis not present

## 2024-05-15 DIAGNOSIS — R519 Headache, unspecified: Secondary | ICD-10-CM

## 2024-05-15 DIAGNOSIS — M79671 Pain in right foot: Secondary | ICD-10-CM

## 2024-05-15 DIAGNOSIS — G43109 Migraine with aura, not intractable, without status migrainosus: Secondary | ICD-10-CM

## 2024-05-15 DIAGNOSIS — S82891S Other fracture of right lower leg, sequela: Secondary | ICD-10-CM

## 2024-05-15 MED ORDER — UBRELVY 50 MG PO TABS: 50.0000 mg | ORAL_TABLET | ORAL | 6 refills | Status: AC | PRN

## 2024-05-15 NOTE — Progress Notes (Signed)
 [facility]  PATIENT: [name] DOB: 07/20/1960  REFERRING CLINICIAN: [name], MD HISTORY FROM: patient  REASON FOR VISIT: new consult   HISTORICAL  CHIEF COMPLAINT:  Chief Complaint  Patient presents with   Gait Problem    Rm 6 with spouse Pt is well, reports tingling, pins and needles and sharp pain in BLE. She has more pain on R side. She also mentions sever headaches.     HISTORY OF PRESENT ILLNESS:   UPDATE (05/15/24, VRP): 63 year old female with right foot pain. Had right ankle fracture May 2024. Then had right foot / toe pain starting there after, worsening in 2025. Had more falls in Jan and May 2025, with lumbar compression fractures. Tried gabapentin  and lyrica  without relief.   New onset headaches started 2023; intermittent, worse in the morning. Nausea, sens to light, throbbing, all over. Now having 3-4 HA per month. Family history of migraine in mother.   PRIOR HPI (02/13/15, Dr. Jenel): Ms. [name] is a 63 year old right-handed white female with a history of a right frontal intracranial hemorrhage that occurred in 1999. The source of the hemorrhage was never known, a cerebral angiogram did not show any underlying aneurysm or AVM. The patient has had some mild left-sided symptoms as a residual from this hemorrhage, but she was able to return to work and function relatively normally. The patient indicates that over the last 2 or 3 years, she has had increasing problems with depression and anxiety. She is having panic attacks on a regular basis up to 2 or 3 times a day. The patient has difficulty focusing, she has noted some problems with her mind racing throughout the day, she has given up cooking and paying the bills. The patient has panic attacks while driving, and she limits this activity as well. She reports difficulty remembering names for people and places, and difficulty remembering recent events. The patient indicates that she sleeps  well. She denies any new numbness or weakness of the extremities. The patient has some mild gait instability, but this has not worsened over time. The patient does have irritable bowel syndrome symptoms. She has occasional headaches 3 or 4 times a week, she takes oxycodone  for this. She is followed through psychiatry for her anxiety and depression. She has had some recent issues with near syncope, no actual syncopal events. This has since resolved. The patient is interested in going on disability, and she comes to this office for an evaluation.    REVIEW OF SYSTEMS: Full 14 system review of systems performed and negative with exception of: as per HPI.  ALLERGIES: Allergies  Allergen Reactions   Morphine And Codeine Hives   Promethazine Hcl Other (See Comments)    Causes patient to become Hyper    HOME MEDICATIONS: Outpatient Medications Prior to Visit  Medication Sig Dispense Refill   acetaminophen  (TYLENOL  8 HOUR) 650 MG CR tablet Take 650 mg by mouth every 8 (eight) hours.     ALPRAZolam  (XANAX ) 1 MG tablet Take 1 tablet (1 mg total) by mouth 3 (three) times daily. 90 tablet 2   atorvastatin  (LIPITOR) 40 MG tablet TAKE ONE TABLET BY MOUTH ONCE DAILY. 90 tablet 3   Blood Glucose Monitoring Suppl DEVI 1 each by Does not apply route in the morning, at noon, and at bedtime. May substitute to any manufacturer covered by patient's insurance. 1 each 0   Calcium  500-125 MG-UNIT TABS Take 1 tablet by mouth daily with supper.  Carboxymethylcellulose Sod PF 0.5 % SOLN Place 1 drop into both eyes daily as needed (dry eyes).     clotrimazole  (LOTRIMIN  AF) 1 % cream Apply 1 Application topically 2 (two) times daily. 30 g 0   diclofenac  Sodium (VOLTAREN ) 1 % GEL Apply 1 application topically daily as needed (pain).     estradiol  (ESTRACE ) 0.1 MG/GM vaginal cream Use 0.5 gm in vagina at bedtime 2 x weekly 42.5 g 1   ezetimibe  (ZETIA ) 10 MG tablet Take 1 tablet (10 mg total) by mouth daily. 90 tablet 3    famotidine (PEPCID) 20 MG tablet Take 20 mg by mouth 2 (two) times daily. As needed     lidocaine -prilocaine  (EMLA ) cream Apply 1 Application topically at bedtime.     lisinopril  (ZESTRIL ) 20 MG tablet Take 1 tablet (20 mg total) by mouth daily. 90 tablet 1   methocarbamol  (ROBAXIN ) 500 MG tablet Take 1 tablet (500 mg total) by mouth every 8 (eight) hours as needed (pain, muscle spasms). 50 tablet 0   methylphenidate  (CONCERTA ) 36 MG PO CR tablet Take 1 tablet (36 mg total) by mouth every morning. 30 tablet 0   methylphenidate  (RITALIN ) 10 MG tablet Take 1 tablet (10 mg total) by mouth 2 (two) times daily with breakfast and lunch. 60 tablet 0   methylphenidate  (RITALIN ) 10 MG tablet Take 1 tablet (10 mg total) by mouth 2 (two) times daily with breakfast and lunch. 60 tablet 0   mirabegron  ER (MYRBETRIQ ) 50 MG TB24 tablet Take 1 tablet (50 mg total) by mouth daily. 30 tablet 5   montelukast  (SINGULAIR ) 10 MG tablet Take 1 tablet (10 mg total) by mouth at bedtime. 30 tablet 5   Multiple Vitamin (MULITIVITAMIN WITH MINERALS) TABS Take 1 tablet by mouth daily with breakfast.     nystatin  (MYCOSTATIN /NYSTOP ) powder Apply 1 Application topically 3 (three) times daily. 60 g 1   Nystatin  POWD by Does not apply route. As needed.     Olopatadine HCl 0.2 % SOLN Place 1 drop into both eyes in the morning and at bedtime.     Omega-3 Fatty Acids (FISH OIL ) 1000 MG CAPS Take 2,000 mg by mouth.     omeprazole  (PRILOSEC) 40 MG capsule Take 1 capsule (40 mg total) by mouth 2 (two) times daily. 60 capsule 0   OVER THE COUNTER MEDICATION Milk thistle once per day.  Mushrooms once daily.  Vit K 2 once per day.  Beet root gummies daily     prednisoLONE acetate (PRED FORTE) 1 % ophthalmic suspension Put 6 drops in the rinse bottle and flush half on each side. 15 mL 5   pregabalin  (LYRICA ) 25 MG capsule TAKE ONE CAPSULE BY MOUTH EVERY DAY 30 capsule 3   Probiotic Product (TRUBIOTICS PO) Take 1 capsule by mouth  daily. Takes 25 cfu daily.     pyridOXINE (VITAMIN B-6) 100 MG tablet Take 100 mg by mouth daily.     rifaximin  (XIFAXAN ) 550 MG TABS tablet Take 1 tablet (550 mg total) by mouth 3 (three) times daily. 42 tablet 0   traZODone  (DESYREL ) 100 MG tablet TAKE (2) TABLETS BY MOUTH AT BEDTIME. 60 tablet 2   TYMLOS 3120 MCG/1.56ML SOPN One qhs     venlafaxine  XR (EFFEXOR  XR) 150 MG 24 hr capsule Take 1 capsule (150 mg total) by mouth daily with breakfast. 90 capsule 2   azelastine  (ASTELIN ) 0.1 % nasal spray Place 2 sprays into both nostrils 2 (two) times daily. Use in  each nostril as directed (Patient not taking: Reported on 05/15/2024) 30 mL 12   cetirizine (ZYRTEC) 10 MG tablet Take 10 mg by mouth daily. (Patient not taking: Reported on 05/15/2024)     HYDROcodone -acetaminophen  (NORCO/VICODIN) 5-325 MG tablet Take 1 tablet by mouth every 8 (eight) hours as needed. (Patient not taking: Reported on 05/15/2024) 30 tablet 0   ipratropium (ATROVENT ) 0.03 % nasal spray Place 2 sprays into both nostrils every 12 (twelve) hours. (Patient not taking: Reported on 05/15/2024) 30 mL 6   rifaximin  (XIFAXAN ) 550 MG TABS tablet 1 PO 3 times a day; Duration: 14 days (Patient not taking: Reported on 05/15/2024)     traMADol -acetaminophen  (ULTRACET ) 37.5-325 MG tablet Take 1 tablet by mouth every 4 (four) hours as needed. (Patient not taking: Reported on 05/15/2024) 90 tablet 5   No facility-administered medications prior to visit.    PAST MEDICAL HISTORY: Past Medical History:  Diagnosis Date   Allergy    grass, dust , mold   Anxiety    Arthritis    Asthma due to seasonal allergies 06/15/2020   Bipolar disorder (HCC)    Carpal tunnel syndrome    Bilateral   Chest pain 09/2011   Cardiac cath-normal coronaries   Constipation    Depression    Difficulty urinating 05/31/2013   Elevated LFTs 12/16/2013   Encounter for general adult medical examination with abnormal findings 07/07/2021   GERD (gastroesophageal  reflux disease)    History of kidney stones    Hyperlipemia    Hyperlipidemia    Hypertension    Mild; provoked by stress and anxiety   IBS (irritable bowel syndrome)    Intracerebral bleed (HCC)    No aneurysm; followed by Dr. Alix Brisker replaced    lense transplant 2022; pt states lense don't dilate or constrict   Loss of weight 01/06/2015   Osteoporosis    Stage 3a chronic kidney disease (HCC) 03/16/2021   Stroke (HCC) 1999   hemorrhagic stroke; weakness of left side    PAST SURGICAL HISTORY: Past Surgical History:  Procedure Laterality Date   BIOPSY  04/16/2021   Procedure: BIOPSY;  Surgeon: Eartha Angelia Sieving, MD;  [facility]: [facility];  Service: Gastroenterology;;  small, bowel, esophageal(proximal and distal);   BIOPSY  07/21/2023   Procedure: BIOPSY;  Surgeon: Eartha Angelia, Sieving, MD;  Location: [facility];  Service: Gastroenterology;;   BRAIN SURGERY  1999   to remove blood clot after stroke    CARDIAC CATHETERIZATION  2016   CERVICAL FUSION     CHOLECYSTECTOMY N/A 10/14/2014   Procedure: LAPAROSCOPIC CHOLECYSTECTOMY WITH INTRAOPERATIVE CHOLANGIOGRAM;  Surgeon: Krystal Russell, MD;  Location[facility];  Service: General;  Laterality: N/A;   CHONDROPLASTY Right 07/13/2017   Procedure: CHONDROPLASTY of patella;  Surgeon: Margrette Taft BRAVO, MD;  Location: [facility];  Service: Orthopedics;  Laterality: Right;   ESOPHAGEAL DILATION N/A 04/16/2021   Procedure: ESOPHAGEAL DILATION;  Surgeon: Eartha Angelia Sieving, MD;  [facility]: [facility];  Service: Gastroenterology;  Laterality: N/A;   ESOPHAGOGASTRODUODENOSCOPY (EGD) WITH PROPOFOL  N/A 04/16/2021   Procedure: ESOPHAGOGASTRODUODENOSCOPY (EGD) WITH PROPOFOL ;  Surgeon: Eartha Angelia Sieving, MD;  [facility]: [facility];  Service: Gastroenterology;  Laterality: N/A;  1:35, pt knows to arrive at 9:45   ESOPHAGOGASTRODUODENOSCOPY (EGD) WITH PROPOFOL  N/A 07/21/2023   Procedure:  ESOPHAGOGASTRODUODENOSCOPY (EGD) WITH PROPOFOL ;  Surgeon: Eartha Angelia Sieving, MD;  [facility];  Service: Gastroenterology;  Laterality: N/A;  9:15AM;ASA 1-2   EUS  N/A 08/21/2015   Procedure: ESOPHAGEAL ENDOSCOPIC ULTRASOUND (EUS) RADIAL;  Surgeon: Belvie Just, MD;  Location: [facility];  Service: Endoscopy;  Laterality: N/A;   KNEE ARTHROSCOPY WITH MEDIAL MENISECTOMY Right 07/13/2017   Procedure: KNEE ARTHROSCOPY WITH PARTIAL MEDIAL MENISECTOMY;  Surgeon: Margrette Taft BRAVO, MD;  Location: [facility];  Service: Orthopedics;  Laterality: Right;   LEFT HEART CATHETERIZATION WITH CORONARY ANGIOGRAM N/A 09/23/2011   Procedure: LEFT HEART CATHETERIZATION WITH CORONARY ANGIOGRAM;  Surgeon: Aleene JINNY Passe, MD;  Location[facility];  Service: Cardiovascular;  Laterality: N/A;   LIPOMA EXCISION Left 11/18/2013   Procedure: EXCISION OF SOFT TISSUE MASS-LEFT THIGH;  Surgeon: Oneil DELENA Budge, MD;  Location: [facility];  Service: General;  Laterality: Left;   NASAL SEPTOPLASTY W/ TURBINOPLASTY Bilateral 08/30/2021   Procedure: NASAL SEPTOPLASTY WITH BILATERAL TURBINATE REDUCTION;  Surgeon: Karis Clunes, MD;  Location: [facility];  Service: ENT;  Laterality: Bilateral;   POLYPECTOMY  04/16/2021   Procedure: POLYPECTOMY;  Surgeon: Eartha Angelia Sieving, MD;  [facility];  Service: Gastroenterology;;  gastric   POLYPECTOMY  07/21/2023   Procedure: POLYPECTOMY;  Surgeon: Eartha Angelia Sieving, MD;  [facility];  Service: Gastroenterology;;   RECTOCELE REPAIR     x2   RECTOCELE REPAIR N/A 04/04/2017   Procedure: POSTERIOR REPAIR (RECTOCELE);  Surgeon: Edsel Norleen GAILS, MD;  Location: [facility];  Service: Gynecology;  Laterality: N/A;   SUBMUCOSAL LIFTING INJECTION  07/21/2023   Procedure: SUBMUCOSAL LIFTING INJECTION;  Surgeon: Eartha Angelia, Sieving, MD;  [facility]: [facility];  Service: Gastroenterology;;   TOTAL ABDOMINAL HYSTERECTOMY      FAMILY  HISTORY: Family History  Problem Relation Age of Onset   Cancer Mother        breast    Hypertension Mother    Hyperlipidemia Mother    Depression Mother    Anxiety disorder Mother    COPD Mother    Arthritis Mother        rheumatoid   Drug abuse Sister    Coronary artery disease Paternal Grandfather    Coronary artery disease Paternal Uncle    Depression Cousin    Drug abuse Cousin     SOCIAL HISTORY: Social History   Socioeconomic History   Marital status: Married    Spouse name: Christopher   Number of children: 0   Years of education: HS   Highest education level: Not on file  Occupational History   Occupation: unemployed    Comment: pending disability  Tobacco Use   Smoking status: Former    Current packs/day: 0.00    Average packs/day: 1 pack/day for 19.0 years (19.0 ttl pk-yrs)    Types: Cigarettes    Start date: 09/02/1978    Quit date: 09/01/1997    Years since quitting: 26.7    Passive exposure: Past   Smokeless tobacco: Never   Tobacco comments:    Quit smoking 1999 , previous 20 pack years  Vaping Use   Vaping status: Never Used  Substance and Sexual Activity   Alcohol use: Yes    Comment: 1 drink every other week   Drug use: No   Sexual activity: Not Currently    Partners: Male    Birth control/protection: Surgical    Comment: hyst   Other Topics Concern   Not on file  Social History Narrative   Currently unable to work   Lives in [place]   Married   Patient drinks 1 cup of caffeine  daily.   Patient is right handed.    Joined the [employer] to get more exercise   Social Drivers of Health   Financial Resource Strain: Low Risk  (04/22/2024)   Overall Financial Resource Strain (CARDIA)    Difficulty of Paying Living Expenses: Not hard at all  Food Insecurity: No Food Insecurity (04/22/2024)   Hunger Vital Sign    Worried About Running Out of Food in the Last Year: Never true    Ran Out of Food in the Last Year: Never true  Transportation Needs: No  Transportation Needs (04/22/2024)   PRAPARE - Administrator, Civil Service (Medical): No    Lack of Transportation (Non-Medical): No  Physical Activity: Sufficiently Active (04/22/2024)   Exercise Vital Sign    Days of Exercise per Week: 7 days    Minutes of Exercise per Session: 30 min  Stress: No Stress Concern Present (04/22/2024)   [employer] of Occupational Health - Occupational Stress Questionnaire    Feeling of Stress: Only a little  Social Connections: Moderately Isolated (04/22/2024)   Social Connection and Isolation Panel    Frequency of Communication with Friends and Family: Twice a week    Frequency of Social Gatherings with Friends and Family: Once a week    Attends Religious Services: Never    Database Administrator or Organizations: No    Attends Banker Meetings: Never    Marital Status: Married  Catering Manager Violence: Not At Risk (04/22/2024)   Humiliation, Afraid, Rape, and Kick questionnaire    Fear of Current or Ex-Partner: No    Emotionally Abused: No    Physically Abused: No    Sexually Abused: No     PHYSICAL EXAM  GENERAL EXAM/CONSTITUTIONAL: Vitals:  Vitals:   05/15/24 1102  BP: 135/89  Pulse: 71  Weight: 151 lb (68.5 kg)  Height: 5' 3 (1.6 m)   Body mass index is 26.75 kg/m. Wt Readings from Last 3 Encounters:  05/15/24 151 lb (68.5 kg)  04/22/24 148 lb 12.8 oz (67.5 kg)  04/03/24 153 lb (69.4 kg)   Patient is in no distress; well developed, nourished and groomed; neck is supple  CARDIOVASCULAR: Examination of carotid arteries is normal; no carotid bruits Regular rate and rhythm, no murmurs Examination of peripheral vascular system by observation and palpation is normal  EYES: Ophthalmoscopic exam of optic discs and posterior segments is normal; no papilledema or hemorrhages No results found.  MUSCULOSKELETAL: Gait, strength, tone, movements noted in Neurologic exam below  NEUROLOGIC: MENTAL  STATUS:     03/29/2022    4:10 PM 02/13/2015    8:33 AM  MMSE - Mini Mental State Exam  Not completed: Unable to complete   Orientation to time  4   Orientation to Place  5   Registration  2   Attention/ Calculation  5   Recall  2   Language- name 2 objects  2   Language- repeat  1  Language- follow 3 step command  3   Language- read & follow direction  1   Write a sentence  1   Copy design  0   Total score  26      Data saved with a previous flowsheet row definition   awake, alert, oriented to person, place and time recent and remote memory intact normal attention and concentration language fluent, comprehension intact, naming intact fund of knowledge appropriate  CRANIAL NERVE:  2nd - no papilledema on fundoscopic  exam 2nd, 3rd, 4th, 6th - pupils equal and reactive to light, visual fields full to confrontation, extraocular muscles intact, no nystagmus 5th - facial sensation symmetric 7th - facial strength --> DECR LEFT LOWER FACIAL STRENGTH 8th - hearing intact 9th - palate elevates symmetrically, uvula midline 11th - shoulder shrug symmetric 12th - tongue protrusion midline  MOTOR:  normal bulk and tone, full strength in the BUE, BLE; EXCEPT LEFT ARM AND LEFT LEG 5-  SENSORY:  normal and symmetric to light touch, temperature, vibration  COORDINATION:  finger-nose-finger, fine finger movements normal  REFLEXES:  deep tendon reflexes SLIGHTLY BRISK IN LUE AND LLE  GAIT/STATION:  narrow based gait; USING WALKER     DIAGNOSTIC DATA (LABS, IMAGING, TESTING) - I reviewed patient records, labs, notes, testing and imaging myself where available.  Lab Results  Component Value Date   WBC 6.3 12/22/2023   HGB 13.5 12/22/2023   HCT 41.8 12/22/2023   MCV 102 (H) 12/22/2023   PLT 294 12/22/2023      Component Value Date/Time   NA 142 12/22/2023 0815   K 4.4 12/22/2023 0815   CL 102 12/22/2023 0815   CO2 23 12/22/2023 0815   GLUCOSE 108 (H) 12/22/2023 0815    GLUCOSE 106 (H) 08/25/2021 1443   BUN 19 12/22/2023 0815   CREATININE 1.16 (H) 12/22/2023 0815   CREATININE 0.99 09/16/2015 0939   CALCIUM  9.5 12/22/2023 0815   PROT 6.9 12/22/2023 0815   ALBUMIN 4.6 12/22/2023 0815   AST 20 12/22/2023 0815   ALT 28 12/22/2023 0815   ALKPHOS 75 12/22/2023 0815   BILITOT 0.7 12/22/2023 0815   GFRNONAA 58 (L) 08/25/2021 1443   GFRAA >60 07/10/2017 1552   Lab Results  Component Value Date   CHOL 124 12/22/2023   HDL 51 12/22/2023   LDLCALC 47 12/22/2023   TRIG 152 (H) 12/22/2023   CHOLHDL 2.4 12/22/2023   Lab Results  Component Value Date   HGBA1C 5.5 07/31/2023   Lab Results  Component Value Date   VITAMINB12 1,185 02/10/2023   Lab Results  Component Value Date   TSH 1.100 12/22/2023   01/19/15 MRI brain -No acute infarct. -Right frontal lobe moderate to large area of encephalomalacia may reflect changes of prior hemorrhage/infarct. -Scattered minimal white matter type changes may reflect result of small vessel disease. -No intracranial mass lesion noted on this unenhanced exam. -Mild global atrophy without hydrocephalus. -Small vertebral arteries and basilar artery with fetal type contribution to the posterior cerebral arteries.  02/19/15 EEG - This is a normal EEG recording in the waking state. No evidence of ictal or interictal discharges are seen.     ASSESSMENT AND PLAN  63 y.o. year old female here with:   Dx:  1. Right foot pain   2. Closed fracture of right ankle, sequela   3. New onset headache   4. Morning headache   5. Snoring   6. Migraine with aura and without status migrainosus, not intractable      PLAN:  NEW ONSET HEADACHES (since 2023; worsening; nausea, sens to light, early morning HA) - check MRI brain (rule out stroke, mass) and sleep study (rule out OSA)  MIGRAINE WITHOUT AURA (3-4 per month) - trial of ubrelvy 50mg  as needed; May repeat x 1 tab after 2 hours; max 2 tabs per day or 8 per  month  HISTORY OF SPONTANEOUS RIGHT FRONTAL INTRACEREBRAL HEMORRHAGE - prior work up was negative for aneurysm / AVM  RIGHT FOOT  PAIN (likely post-traumatic from right ankle fracture in May 2024 and lumbar compression fractures Jan / May 2025) - continue pain mgmt; consider PT evaluation for foot / toe arthritis  Meds ordered this encounter  Medications   Ubrogepant (UBRELVY) 50 MG TABS    Sig: Take 1 tablet (50 mg total) by mouth as needed. May repeat x 1 tab after 2 hours; max 2 tabs per day or 8 per month    Dispense:  8 tablet    Refill:  6   Orders Placed This Encounter  Procedures   MR BRAIN W WO CONTRAST   Ambulatory referral to Sleep Studies   Return in about 6 months (around 11/12/2024) for MyChart visit (15 min).  I spent 60 minutes of face-to-face and non-face-to-face time with patient.  This included previsit chart review, lab review, study review, order entry, electronic health record documentation, patient education.    EDUARD FABIENE HANLON, MD 05/15/2024, 11:36 AM Certified in Neurology, Neurophysiology and Neuroimaging  [facility] [address] [place], [place] [address] [phone]

## 2024-05-15 NOTE — Telephone Encounter (Signed)
 no auth required sent to GI [phone]

## 2024-05-15 NOTE — Patient Instructions (Addendum)
  NEW ONSET HEADACHES (since 2023; worsening; nausea, sens to light, early morning HA) - check MRI brain (rule out stroke, mass) and sleep study (rule out OSA)  MIGRAINE WITHOUT AURA (3-4 per month) - trial of ubrelvy 50mg  as needed; May repeat x 1 tab after 2 hours; max 2 tabs per day or 8 per month  RIGHT FOOT PAIN (post-traumatic from right ankle fracture and lumbar compression fractures) - continue pain mgmt; consider PT evaluation for foot / toe arthritis

## 2024-05-16 ENCOUNTER — Encounter: Payer: Self-pay | Admitting: Diagnostic Neuroimaging

## 2024-05-16 ENCOUNTER — Encounter: Payer: Self-pay | Admitting: Orthopedic Surgery

## 2024-05-16 ENCOUNTER — Telehealth: Payer: Self-pay | Admitting: *Deleted

## 2024-05-16 DIAGNOSIS — M25551 Pain in right hip: Secondary | ICD-10-CM

## 2024-05-16 NOTE — Telephone Encounter (Signed)
 Pt reached out to us  after being told by pharmacy that cost of [place] was $900. Can someone check into possible PA being needed for this? Thanks!

## 2024-05-17 ENCOUNTER — Other Ambulatory Visit (HOSPITAL_COMMUNITY): Payer: Self-pay

## 2024-05-17 NOTE — Telephone Encounter (Signed)
 Pharmacy Patient Advocate Encounter  Received notification from [facility] [employer] that Prior Authorization for Ubrelvy 50mg  Tablet has been APPROVED from 05/17/2024 to 05/17/2025. Unable to obtain price due to refill too soon rejection, last fill date 05/17/2024 next available fill date12/01/2024   PA #/Case ID/Reference #: [id number]

## 2024-05-17 NOTE — Telephone Encounter (Signed)
 Pharmacy Patient Advocate Encounter   Received notification from Physician's Office that prior authorization for Ubrelvy 50mg  Tablet is required/requested.   Insurance verification completed.   The patient is insured through [facility] ADVANTAGE/RX ADVANCE.   Per test claim: PA required; PA submitted to above mentioned insurance via Latent Key/confirmation #/EOC [id number] Status is pending

## 2024-05-20 ENCOUNTER — Other Ambulatory Visit: Payer: Self-pay | Admitting: Internal Medicine

## 2024-05-20 ENCOUNTER — Encounter: Payer: Self-pay | Admitting: Internal Medicine

## 2024-05-20 ENCOUNTER — Encounter: Payer: Self-pay | Admitting: Orthopedic Surgery

## 2024-05-20 DIAGNOSIS — J0111 Acute recurrent frontal sinusitis: Secondary | ICD-10-CM

## 2024-05-20 MED ORDER — AMOXICILLIN-POT CLAVULANATE 875-125 MG PO TABS: 1.0000 | ORAL_TABLET | Freq: Two times a day (BID) | ORAL | 0 refills | Status: DC

## 2024-05-21 ENCOUNTER — Encounter: Payer: Self-pay | Admitting: Sports Medicine

## 2024-05-21 ENCOUNTER — Other Ambulatory Visit: Payer: Self-pay | Admitting: Internal Medicine

## 2024-05-21 ENCOUNTER — Encounter: Payer: Self-pay | Admitting: Internal Medicine

## 2024-05-21 ENCOUNTER — Encounter: Payer: Self-pay | Admitting: Orthopedic Surgery

## 2024-05-21 DIAGNOSIS — E782 Mixed hyperlipidemia: Secondary | ICD-10-CM

## 2024-05-22 ENCOUNTER — Encounter: Payer: Self-pay | Admitting: Orthopedic Surgery

## 2024-05-22 DIAGNOSIS — G8929 Other chronic pain: Secondary | ICD-10-CM

## 2024-05-22 NOTE — Telephone Encounter (Signed)
 I messaged patient to let her know to make an appointment with us . She is inquiring about an ankle injection.

## 2024-05-22 NOTE — Telephone Encounter (Signed)
 I sent patient a message on a previous message she sent. Dr. Harden is out for the next two week and I explained that to her but she can see his NP or PA for ankle injections. She will need to call the office to schedule.

## 2024-05-23 ENCOUNTER — Ambulatory Visit: Admitting: Sports Medicine

## 2024-05-23 ENCOUNTER — Ambulatory Visit: Payer: Self-pay

## 2024-05-23 ENCOUNTER — Ambulatory Visit: Admitting: Orthopedic Surgery

## 2024-05-23 ENCOUNTER — Encounter: Payer: Self-pay | Admitting: Sports Medicine

## 2024-05-23 DIAGNOSIS — M25552 Pain in left hip: Secondary | ICD-10-CM | POA: Diagnosis not present

## 2024-05-23 DIAGNOSIS — M25561 Pain in right knee: Secondary | ICD-10-CM | POA: Diagnosis not present

## 2024-05-23 DIAGNOSIS — R2681 Unsteadiness on feet: Secondary | ICD-10-CM | POA: Diagnosis not present

## 2024-05-23 DIAGNOSIS — S32010S Wedge compression fracture of first lumbar vertebra, sequela: Secondary | ICD-10-CM | POA: Diagnosis not present

## 2024-05-23 DIAGNOSIS — M7061 Trochanteric bursitis, right hip: Secondary | ICD-10-CM | POA: Diagnosis not present

## 2024-05-23 DIAGNOSIS — M25551 Pain in right hip: Secondary | ICD-10-CM

## 2024-05-23 DIAGNOSIS — M7062 Trochanteric bursitis, left hip: Secondary | ICD-10-CM | POA: Diagnosis not present

## 2024-05-23 DIAGNOSIS — R29898 Other symptoms and signs involving the musculoskeletal system: Secondary | ICD-10-CM | POA: Diagnosis not present

## 2024-05-23 DIAGNOSIS — G8929 Other chronic pain: Secondary | ICD-10-CM | POA: Diagnosis not present

## 2024-05-23 MED ORDER — LIDOCAINE HCL 1 % IJ SOLN
4.0000 mL | INTRAMUSCULAR | Status: AC | PRN
  Administered 2024-05-23: 4 mL

## 2024-05-23 MED ORDER — METHYLPREDNISOLONE ACETATE 40 MG/ML IJ SUSP
40.0000 mg | INTRAMUSCULAR | Status: AC | PRN
  Administered 2024-05-23: 40 mg via INTRA_ARTICULAR

## 2024-05-23 MED ORDER — METHYLPREDNISOLONE ACETATE 40 MG/ML IJ SUSP
40.0000 mg | Freq: Once | INTRAMUSCULAR | Status: AC
  Administered 2024-05-23: 40 mg via INTRA_ARTICULAR

## 2024-05-23 NOTE — Progress Notes (Signed)
 [name] - 63 y.o. female MRN [id number]  Date of birth: 04-13-1961  Office Visit Note: Visit Date: 05/23/2024 PCP: Diamond Suzzane POUR, MD Referred by: Diamond Ozell LABOR, MD  Subjective: Chief Complaint  Patient presents with   Right Hip - Pain   Left Hip - Pain   HPI: [name] is a pleasant 63 y.o. female who presents today for evaluation of acute on chronic bilateral hip pain.   [name] has been experiencing bilateral lateral hip pain, the right hip is worse than the left.  She does have a notable history of 2 previous lumbar vertebrae compression fractures (L1, L2) -has seen Dr. Georgina for this.  Note reviewed from 03/11/2024.[name]  She does report a history of having a stroke, she has a history of gait instability and feels this is affecting her gait. We have discussed physical therapy, although she is been dealing with a migraine which is affecting her ADLs.  She would like to get this cleared first before starting physical therapy did not exacerbate.  She recently started a new medication for her migraines.  She is also managed on Lyrica  25 mg daily.  Lab Results  Component Value Date   HGBA1C 5.5 07/31/2023   Pertinent ROS were reviewed with the patient and found to be negative unless otherwise specified above in HPI.   Assessment & Plan: Visit Diagnoses:  1. Bilateral hip pain   2. Trochanteric bursitis, left hip   3. Trochanteric bursitis, right hip   4. Weakness of both hips   5. Compression fracture of L1 vertebra, sequela   6. Gait instability    Plan: Impression is acute on chronic bilateral lateral hip pain.  Did review previous radiographs which showed no significant arthritic change about the hips.  Symptoms and physical exam are indicative of trochanteric bursitis with a degree of gluteal insufficiency, right greater than left.  Through shared decision making, we did proceed with ultrasound-guided greater trochanter injection for both the right and the left hip.   Advised on postinjection protocol.  I do think she would benefit from formalized physical therapy to work on her gait instability, strengthening her hip abductors and pelvic musculature.  Did discuss physical therapy, she would like to wait until she has her migraine headaches more at bay and then may consider this.  She will continue her Lyrica  25 mg daily.  She may use ice/heat and/or Tylenol  for any postinjection pain.  She will continue follow-up for her lumbar compression fracture history with Dr. Georgina.  I am happy to see her back as needed.  She is interested in having her ankle evaluated/injection.  We will have her follow-up with Dr. Harden and/or one of his PAs for this in the interim.  Follow-up: Return if symptoms worsen or fail to improve.   Meds & Orders: No orders of the defined types were placed in this encounter.   Orders Placed This Encounter  Procedures   Large Joint Inj: R greater trochanter   Large Joint Inj: L greater trochanter   US  Guided Needle Placement - No Linked Charges     Procedures: Large Joint Inj: R greater trochanter on 05/23/2024 10:25 AM Indications: pain Details: 22 G 3.5 in needle, ultrasound-guided anterior approach Medications: 4 mL lidocaine  1 %; 40 mg methylPREDNISolone  acetate 40 MG/ML Outcome: tolerated well, no immediate complications  US -Guided Greater Trochanteric Bursa Injection, right After discussion on risks/benefits/indications and informed verbal consent was obtained, a timeout was performed. The patient  was lying in lateral recumbent position on exam table. Using ultrasound guidance, the greater trochanter was identified. The area overlying the trochanteric bursa was then prepped with Betadine  and alcohol swabs. Following sterile precautions, ultrasound was reapplied to visualize needle guidance with a 22-gauge 3.5 needle utilizing an in-plane approach to inject the bursa with 4:1 lidocaine :methylprednislolone. Delivery of the injectate was  visualized into the region of hypoechoic fluid of the greater trochanteric bursa. Patient tolerated procedure well without immediate complications.     Procedure, treatment alternatives, risks and benefits explained, specific risks discussed. Consent was given by the patient. Immediately prior to procedure a time out was called to verify the correct patient, procedure, equipment, support staff and site/side marked as required. Patient was prepped and draped in the usual sterile fashion.    Large Joint Inj: L greater trochanter on 05/23/2024 10:25 AM Indications: pain Details: 22 G 3.5 in needle, ultrasound-guided anterior approach Medications: 4 mL lidocaine  1 %; 40 mg methylPREDNISolone  acetate 40 MG/ML Outcome: tolerated well, no immediate complications  US -Guided Greater Trochanteric Bursa Injection, left After discussion on risks/benefits/indications and informed verbal consent was obtained, a timeout was performed. The patient was lying in lateral recumbent position on exam table. Using ultrasound guidance, the greater trochanter was identified. The area overlying the trochanteric bursa was then prepped with Betadine  and alcohol swabs. Following sterile precautions, ultrasound was reapplied to visualize needle guidance with a 22-gauge 3.5 needle utilizing an in-plane approach to inject the bursa with 4:1 lidocaine :methylprednislolone. Delivery of the injectate was visualized into the region of hypoechoic fluid of the greater trochanteric bursa. Patient tolerated procedure well without immediate complications.     Procedure, treatment alternatives, risks and benefits explained, specific risks discussed. Consent was given by the patient. Immediately prior to procedure a time out was called to verify the correct patient, procedure, equipment, support staff and site/side marked as required. Patient was prepped and draped in the usual sterile fashion.          Clinical History: No specialty  comments available.  [name] reports that [name] quit smoking about 26 years ago. [name]'s smoking use included cigarettes. [name] started smoking about 45 years ago. [name] has a 19 pack-year smoking history. [name] has been exposed to tobacco smoke. [name] has never used smokeless tobacco.  Recent Labs    07/31/23 1440  HGBA1C 5.5    Objective:    Physical Exam  Gen: Well-appearing, in no acute distress; non-toxic CV: Well-perfused. Warm.  Resp: Breathing unlabored on room air; no wheezing. Psych: Fluid speech in conversation; appropriate affect; normal thought process  Ortho Exam - Bilateral hips: There is no restriction with internal and external logroll.  Positive TTP over the greater trochanteric region right greater than left.  There is a mild degree of soft tissue swelling over the bursa of the right hip, minimal on the left hip.  There is an antalgic gait. Noted gluteal insufficiency strength.  Imaging:  *I did personally review and interpret hip x-rays from 07/13/2023 as well as lumbar spine x-rays from 03/11/2024 with attention to the hip joints.  Radiographs demonstrate minimal arthritic change, femoral head is well-seated within the acetabulum.  No acute fracture noted.  07/13/23: AP pelvis AP left hip frog-leg left hip are obtained reviewed this shows  that are negative for acute changes no degenerative changes in the left  hip.  Sacroiliac joint is normal.   Impression:  pelvis and left hip radiographs negative for acute or chronic  changes.  Past Medical/Family/Surgical/Social History: Medications & Allergies reviewed per EMR, new medications updated. Patient Active Problem List   Diagnosis Date Noted   Bloating 02/22/2024   Idiopathic peripheral neuropathy 01/12/2024   Closed fracture of distal end of right fibula with routine healing 01/01/2024   Sprain of left wrist 08/04/2023   Small intestinal bacterial overgrowth (SIBO) 12/08/2022   Chronic fatigue 11/09/2022   Family  history of breast cancer 11/08/2022   Osteoporosis with current pathological fracture 08/29/2022   Intestinal methanogen overgrowth 04/28/2022   History of total abdominal hysterectomy 03/28/2022   Dystrophia unguium 02/01/2022   Lumbar compression fracture (HCC) 12/23/2021   Lumbar spondylosis 11/04/2021   Encounter for general adult medical examination with abnormal findings 07/07/2021   Gastric polyp 05/31/2021   NASH (nonalcoholic steatohepatitis) 04/01/2021   Dysphagia 04/01/2021   Stage 3a chronic kidney disease (HCC) 03/16/2021   Arthritis 01/31/2021   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   History of hemorrhagic stroke with residual hemiplegia (HCC) 06/15/2020   Urinary incontinence 06/15/2020   Asthma due to seasonal allergies 06/15/2020   Vaginal itching 03/16/2020   S/P right knee arthroscopy 07/13/17 07/20/2017   Derangement of posterior horn of medial meniscus of right knee    Chondromalacia, patella, right    Irritable bowel syndrome with constipation and diarrhea 05/11/2015   Gait instability 01/06/2015   DDD (degenerative disc disease), lumbar 02/18/2014   Postmenopausal atrophic vaginitis 11/01/2013   Lipoma of abdominal wall 08/14/2013   Paresthesia of foot 02/12/2013   Hypertension    Recurrent depression 11/17/2011   Insomnia 11/17/2011   Mixed hyperlipidemia 09/20/2011   Anxiety 09/20/2011   Past Medical History:  Diagnosis Date   Allergy    grass, dust , mold   Anxiety    Arthritis    Asthma due to seasonal allergies 06/15/2020   Bipolar disorder (HCC)    Carpal tunnel syndrome    Bilateral   Chest pain 09/2011   Cardiac cath-normal coronaries   Constipation    Depression    Difficulty urinating 05/31/2013   Elevated LFTs 12/16/2013   Encounter for general adult medical examination with abnormal findings 07/07/2021   GERD (gastroesophageal reflux disease)    History of kidney stones    Hyperlipemia    Hyperlipidemia    Hypertension    Mild; provoked  by stress and anxiety   IBS (irritable bowel syndrome)    Intracerebral bleed (HCC)    No aneurysm; followed by Dr. Alix Brisker replaced    lense transplant 2022; pt states lense don't dilate or constrict   Loss of weight 01/06/2015   Osteoporosis    Stage 3a chronic kidney disease (HCC) 03/16/2021   Stroke (HCC) 1999   hemorrhagic stroke; weakness of left side   Family History  Problem Relation Age of Onset   Cancer Mother        breast    Hypertension Mother    Hyperlipidemia Mother    Depression Mother    Anxiety disorder Mother    COPD Mother    Arthritis Mother        rheumatoid   Drug abuse Sister    Coronary artery disease Paternal Grandfather    Coronary artery disease Paternal Uncle    Depression Cousin    Drug abuse Cousin    Past Surgical History:  Procedure Laterality Date   BIOPSY  04/16/2021   Procedure: BIOPSY;  Surgeon: Eartha Angelia Sieving, MD;  Location: [facility];  Service: Gastroenterology;;  small,  bowel, esophageal(proximal and distal);   BIOPSY  07/21/2023   Procedure: BIOPSY;  Surgeon: Eartha Flavors, Toribio, MD;  Location: [facility];  Service: Gastroenterology;;   BRAIN SURGERY  1999   to remove blood clot after stroke    CARDIAC CATHETERIZATION  2016   CERVICAL FUSION     CHOLECYSTECTOMY N/A 10/14/2014   Procedure: LAPAROSCOPIC CHOLECYSTECTOMY WITH INTRAOPERATIVE CHOLANGIOGRAM;  Surgeon: Krystal Russell, MD;  Location: [facility] OR;  Service: General;  Laterality: N/A;   CHONDROPLASTY Right 07/13/2017   Procedure: CHONDROPLASTY of patella;  Surgeon: Margrette Taft BRAVO, MD;  Location: [facility];  Service: Orthopedics;  Laterality: Right;   ESOPHAGEAL DILATION N/A 04/16/2021   Procedure: ESOPHAGEAL DILATION;  Surgeon: Eartha Flavors Toribio, MD;  Location: [facility];  Service: Gastroenterology;  Laterality: N/A;   ESOPHAGOGASTRODUODENOSCOPY (EGD) WITH PROPOFOL  N/A 04/16/2021   Procedure: ESOPHAGOGASTRODUODENOSCOPY (EGD) WITH  PROPOFOL ;  Surgeon: Eartha Flavors Toribio, MD;  Location: [facility];  Service: Gastroenterology;  Laterality: N/A;  1:35, pt knows to arrive at 9:45   ESOPHAGOGASTRODUODENOSCOPY (EGD) WITH PROPOFOL  N/A 07/21/2023   Procedure: ESOPHAGOGASTRODUODENOSCOPY (EGD) WITH PROPOFOL ;  Surgeon: Eartha Flavors Toribio, MD;  Location: [facility];  Service: Gastroenterology;  Laterality: N/A;  9:15AM;ASA 1-2   EUS N/A 08/21/2015   Procedure: ESOPHAGEAL ENDOSCOPIC ULTRASOUND (EUS) RADIAL;  Surgeon: Belvie Just, MD;  Location: [facility];  Service: Endoscopy;  Laterality: N/A;   KNEE ARTHROSCOPY WITH MEDIAL MENISECTOMY Right 07/13/2017   Procedure: KNEE ARTHROSCOPY WITH PARTIAL MEDIAL MENISECTOMY;  Surgeon: Margrette Taft BRAVO, MD;  Location: [facility];  Service: Orthopedics;  Laterality: Right;   LEFT HEART CATHETERIZATION WITH CORONARY ANGIOGRAM N/A 09/23/2011   Procedure: LEFT HEART CATHETERIZATION WITH CORONARY ANGIOGRAM;  Surgeon: Aleene JINNY Passe, MD;  Location: [facility];  Service: Cardiovascular;  Laterality: N/A;   LIPOMA EXCISION Left 11/18/2013   Procedure: EXCISION OF SOFT TISSUE MASS-LEFT THIGH;  Surgeon: Oneil DELENA Budge, MD;  Location: [facility];  Service: General;  Laterality: Left;   NASAL SEPTOPLASTY W/ TURBINOPLASTY Bilateral 08/30/2021   Procedure: NASAL SEPTOPLASTY WITH BILATERAL TURBINATE REDUCTION;  Surgeon: Karis Clunes, MD;  Location: [facility];  Service: ENT;  Laterality: Bilateral;   POLYPECTOMY  04/16/2021   Procedure: POLYPECTOMY;  Surgeon: Eartha Flavors Toribio, MD;  [facility]: [facility];  Service: Gastroenterology;;  gastric   POLYPECTOMY  07/21/2023   Procedure: POLYPECTOMY;  Surgeon: Eartha Flavors Toribio, MD;  Location: [facility];  Service: Gastroenterology;;   RECTOCELE REPAIR     x2   RECTOCELE REPAIR N/A 04/04/2017   Procedure: POSTERIOR REPAIR (RECTOCELE);  Surgeon: Edsel Norleen GAILS, MD;  Location: [facility];  Service: Gynecology;  Laterality:  N/A;   SUBMUCOSAL LIFTING INJECTION  07/21/2023   Procedure: SUBMUCOSAL LIFTING INJECTION;  Surgeon: Eartha Flavors, Toribio, MD;  Location: [facility];  Service: Gastroenterology;;   TOTAL ABDOMINAL HYSTERECTOMY     Social History   Occupational History   Occupation: unemployed    Comment: pending disability  Tobacco Use   Smoking status: Former    Current packs/day: 0.00    Average packs/day: 1 pack/day for 19.0 years (19.0 ttl pk-yrs)    Types: Cigarettes    Start date: 09/02/1978    Quit date: 09/01/1997    Years since quitting: 26.7    Passive exposure: Past   Smokeless tobacco: Never   Tobacco comments:    Quit smoking 1999 , previous 20 pack years  Vaping Use   Vaping status: Never Used  Substance and Sexual Activity   Alcohol use: Yes    Comment: 1 drink every other week   Drug use: No   Sexual activity: Not Currently    Partners: Male    Birth control/protection: Surgical    Comment: hyst

## 2024-05-23 NOTE — Progress Notes (Signed)
 Procedural visit  Patient: [name]           Date of Birth: Feb 13, 1961           MRN: [id number] Visit Date: 05/23/2024 Requested by: [name], MD [address] [place],  [place] [address] PCP: [name], MD    Encounter Diagnosis  Name Primary?   Chronic pain of right knee Yes    Chief Complaint  Patient presents with   Injections    Right knee /requesting injection     The patient requests injection right knee last evaluated in June 24 she was found to have chondromalacia of the patella comes in today requesting injection right knee  ASSESSMENT AND PLAN We injected the right knee.  We used 40 mg of Depo-Medrol  3 to 4 cc of 1% lidocaine   We injected through the lateral portal it was tolerated well  We did obtain verbal consent we took a timeout to confirm the injection site and medication  No complications were noted  Return as needed

## 2024-05-24 DIAGNOSIS — R7303 Prediabetes: Secondary | ICD-10-CM | POA: Diagnosis not present

## 2024-05-25 LAB — CMP14+EGFR
ALT: 28 IU/L (ref 0–32)
AST: 23 IU/L (ref 0–40)
Albumin: 5.2 g/dL — ABNORMAL HIGH (ref 3.9–4.9)
Alkaline Phosphatase: 66 IU/L (ref 49–135)
BUN/Creatinine Ratio: 14 (ref 12–28)
BUN: 15 mg/dL (ref 8–27)
Bilirubin Total: 0.8 mg/dL (ref 0.0–1.2)
CO2: 23 mmol/L (ref 20–29)
Calcium: 9.9 mg/dL (ref 8.7–10.3)
Chloride: 99 mmol/L (ref 96–106)
Creatinine, Ser: 1.11 mg/dL — ABNORMAL HIGH (ref 0.57–1.00)
Globulin, Total: 2.3 g/dL (ref 1.5–4.5)
Glucose: 120 mg/dL — ABNORMAL HIGH (ref 70–99)
Potassium: 4.2 mmol/L (ref 3.5–5.2)
Sodium: 140 mmol/L (ref 134–144)
Total Protein: 7.5 g/dL (ref 6.0–8.5)
eGFR: 57 mL/min/1.73 — ABNORMAL LOW (ref >59)

## 2024-05-25 LAB — CBC WITH DIFFERENTIAL/PLATELET
Basophils Absolute: 0 x10E3/uL (ref 0.0–0.2)
Basos: 0 %
EOS (ABSOLUTE): 0 x10E3/uL (ref 0.0–0.4)
Eos: 0 %
Hematocrit: 44.2 % (ref 34.0–46.6)
Hemoglobin: 14.6 g/dL (ref 11.1–15.9)
Immature Grans (Abs): 0 x10E3/uL (ref 0.0–0.1)
Immature Granulocytes: 0 %
Lymphocytes Absolute: 1.6 x10E3/uL (ref 0.7–3.1)
Lymphs: 15 %
MCH: 33.7 pg — ABNORMAL HIGH (ref 26.6–33.0)
MCHC: 33 g/dL (ref 31.5–35.7)
MCV: 102 fL — ABNORMAL HIGH (ref 79–97)
Monocytes Absolute: 0.4 x10E3/uL (ref 0.1–0.9)
Monocytes: 4 %
Neutrophils Absolute: 8.4 x10E3/uL — ABNORMAL HIGH (ref 1.4–7.0)
Neutrophils: 81 %
Platelets: 305 x10E3/uL (ref 150–450)
RBC: 4.33 x10E6/uL (ref 3.77–5.28)
RDW: 12.2 % (ref 11.7–15.4)
WBC: 10.4 x10E3/uL (ref 3.4–10.8)

## 2024-05-25 LAB — HEMOGLOBIN A1C
Est. average glucose Bld gHb Est-mCnc: 108 mg/dL
Hgb A1c MFr Bld: 5.4 % (ref 4.8–5.6)

## 2024-05-26 ENCOUNTER — Encounter: Payer: Self-pay | Admitting: Orthopedic Surgery

## 2024-06-03 ENCOUNTER — Encounter: Payer: Self-pay | Admitting: Internal Medicine

## 2024-06-03 ENCOUNTER — Ambulatory Visit: Admitting: Internal Medicine

## 2024-06-03 ENCOUNTER — Ambulatory Visit (INDEPENDENT_AMBULATORY_CARE_PROVIDER_SITE_OTHER): Admitting: Otolaryngology

## 2024-06-03 VITALS — BP 118/77 | HR 79 | Ht 63.0 in | Wt 146.0 lb

## 2024-06-03 DIAGNOSIS — R7303 Prediabetes: Secondary | ICD-10-CM

## 2024-06-03 DIAGNOSIS — I10 Essential (primary) hypertension: Secondary | ICD-10-CM

## 2024-06-03 DIAGNOSIS — K582 Mixed irritable bowel syndrome: Secondary | ICD-10-CM

## 2024-06-03 DIAGNOSIS — E782 Mixed hyperlipidemia: Secondary | ICD-10-CM

## 2024-06-03 DIAGNOSIS — N1831 Chronic kidney disease, stage 3a: Secondary | ICD-10-CM

## 2024-06-03 DIAGNOSIS — Z0001 Encounter for general adult medical examination with abnormal findings: Secondary | ICD-10-CM

## 2024-06-03 DIAGNOSIS — M47816 Spondylosis without myelopathy or radiculopathy, lumbar region: Secondary | ICD-10-CM

## 2024-06-03 DIAGNOSIS — S32020S Wedge compression fracture of second lumbar vertebra, sequela: Secondary | ICD-10-CM

## 2024-06-03 DIAGNOSIS — E559 Vitamin D deficiency, unspecified: Secondary | ICD-10-CM

## 2024-06-03 MED ORDER — PREGABALIN 25 MG PO CAPS: 25.0000 mg | ORAL_CAPSULE | Freq: Every day | ORAL | 5 refills | Status: AC

## 2024-06-03 NOTE — Progress Notes (Unsigned)
 Established Patient Office Visit  Subjective:  Patient ID: [name], female    DOB: 14-Feb-1961  Age: 63 y.o. MRN: [id number]  CC:  Chief Complaint  Patient presents with   Hypertension    5 month f/u    Hyperlipidemia    Has concerns about her triglycerides.    Chronic Kidney Disease    5 month f/u     HPI [name] is a 63 y.o. female with past medical history of HTN, CKD, asthma due to seasonal allergies, h/o hemorrhagic stroke in 1999 (unclear etiology), HLD, anxiety with depression, urinary incontinence and GERD who presents for f/u of her chronic medical conditions.  HTN: Her BP has been wnl at home.  She has been taking lisinopril  20 mg daily again.  Her BP was well controlled today.  She denies any headache, chest pain or palpitations.  She had a fall on 07/29/23 and then in 05/25, likely mechanical due to leg weakness. She has left wrist pain, worse with extension.  She has wrist brace in place.  She is followed by Dr. Orthopedic surgery and going to schedule appt.  Lumbar compression fracture: She had a fall at nighttime at her home while using restroom in 10/23.  She sustained a L2 compression fracture, had a brace and was followed by neurosurgeon.  She has low back pain, for which she takes tramadol  as needed.  Denies any pain upon deep breathing.  She has been continuing moderate activity as tolerated.    Peripheral neuropathy: She has been taking Lyrica  for peripheral neuropathy with better response, but still has numbness and burning sensation of feet.  She had dizzy spells with gabapentin .  Osteoporosis: She is on Tymlos now.   CKD: Her last CMP showed GFR of 57.  Denies any dysuria, hematuria or urinary hesitance or resistance.  She takes oxybutynin  for urinary incontinence.  NASH: Followed by GI.  She has RUQ area pain, which is more likely due to radiating pain from lumbar spine.  Her pain is intermittent, worse with movement and unrelated to food  intake.  She has chronic bloating, and was found to have intestinal methanogen overgrowth.  She was given Xifaxan  for SIBO and/or IBS-D by GI, which she has completed with some improvement in her symptoms.  Chronic fatigue: She still reports chronic fatigue.  She currently takes Concerta  for ADD, followed by psychiatry.  Her TSH, vitamin D  and B12 levels are WNL.  Denies any active bleeding.  She takes Effexor  for MDD and Xanax  for GAD.  Past Medical History:  Diagnosis Date   Allergy    grass, dust , mold   Anxiety    Arthritis    Asthma due to seasonal allergies 06/15/2020   Bipolar disorder (HCC)    Carpal tunnel syndrome    Bilateral   Chest pain 09/2011   Cardiac cath-normal coronaries   Constipation    Depression    Difficulty urinating 05/31/2013   Elevated LFTs 12/16/2013   Encounter for general adult medical examination with abnormal findings 07/07/2021   GERD (gastroesophageal reflux disease)    History of kidney stones    Hyperlipemia    Hyperlipidemia    Hypertension    Mild; provoked by stress and anxiety   IBS (irritable bowel syndrome)    Intracerebral bleed (HCC)    No aneurysm; followed by Dr. Alix Brisker replaced    lense transplant 2022; pt states lense don't dilate or constrict   Loss  of weight 01/06/2015   Osteoporosis    Stage 3a chronic kidney disease (HCC) 03/16/2021   Stroke (HCC) 1999   hemorrhagic stroke; weakness of left side    Past Surgical History:  Procedure Laterality Date   BIOPSY  04/16/2021   Procedure: BIOPSY;  Surgeon: Eartha Angelia Sieving, MD;  Location: [facility];  Service: Gastroenterology;;  small, bowel, esophageal(proximal and distal);   BIOPSY  07/21/2023   Procedure: BIOPSY;  Surgeon: Eartha Angelia, Sieving, MD;  Location: [facility];  Service: Gastroenterology;;   BRAIN SURGERY  1999   to remove blood clot after stroke    CARDIAC CATHETERIZATION  2016   CERVICAL FUSION     CHOLECYSTECTOMY N/A  10/14/2014   Procedure: LAPAROSCOPIC CHOLECYSTECTOMY WITH INTRAOPERATIVE CHOLANGIOGRAM;  Surgeon: Krystal Russell, MD;  Location: [facility];  Service: General;  Laterality: N/A;   CHONDROPLASTY Right 07/13/2017   Procedure: CHONDROPLASTY of patella;  Surgeon: Margrette Taft BRAVO, MD;  Location: [facility];  Service: Orthopedics;  Laterality: Right;   ESOPHAGEAL DILATION N/A 04/16/2021   Procedure: ESOPHAGEAL DILATION;  Surgeon: Eartha Angelia Sieving, MD;  Location: [facility];  Service: Gastroenterology;  Laterality: N/A;   ESOPHAGOGASTRODUODENOSCOPY (EGD) WITH PROPOFOL  N/A 04/16/2021   Procedure: ESOPHAGOGASTRODUODENOSCOPY (EGD) WITH PROPOFOL ;  Surgeon: Eartha Angelia Sieving, MD;  Location: [facility];  Service: Gastroenterology;  Laterality: N/A;  1:35, pt knows to arrive at 9:45   ESOPHAGOGASTRODUODENOSCOPY (EGD) WITH PROPOFOL  N/A 07/21/2023   Procedure: ESOPHAGOGASTRODUODENOSCOPY (EGD) WITH PROPOFOL ;  Surgeon: Eartha Angelia Sieving, MD;  [facility]: [facility];  Service: Gastroenterology;  Laterality: N/A;  9:15AM;ASA 1-2   EUS N/A 08/21/2015   Procedure: ESOPHAGEAL ENDOSCOPIC ULTRASOUND (EUS) RADIAL;  Surgeon: Belvie Just, MD;  Location: [facility];  Service: Endoscopy;  Laterality: N/A;   KNEE ARTHROSCOPY WITH MEDIAL MENISECTOMY Right 07/13/2017   Procedure: KNEE ARTHROSCOPY WITH PARTIAL MEDIAL MENISECTOMY;  Surgeon: Margrette Taft BRAVO, MD;  Location: [facility];  Service: Orthopedics;  Laterality: Right;   LEFT HEART CATHETERIZATION WITH CORONARY ANGIOGRAM N/A 09/23/2011   Procedure: LEFT HEART CATHETERIZATION WITH CORONARY ANGIOGRAM;  Surgeon: Aleene JINNY Passe, MD;  Location: [facility];  Service: Cardiovascular;  Laterality: N/A;   LIPOMA EXCISION Left 11/18/2013   Procedure: EXCISION OF SOFT TISSUE MASS-LEFT THIGH;  Surgeon: Oneil DELENA Budge, MD;  Location: [facility];  Service: General;  Laterality: Left;   NASAL SEPTOPLASTY W/ TURBINOPLASTY Bilateral 08/30/2021   Procedure: NASAL  SEPTOPLASTY WITH BILATERAL TURBINATE REDUCTION;  Surgeon: Karis Clunes, MD;  Location: [facility];  Service: ENT;  Laterality: Bilateral;   POLYPECTOMY  04/16/2021   Procedure: POLYPECTOMY;  Surgeon: Eartha Angelia Sieving, MD;  [facility]: [facility];  Service: Gastroenterology;;  gastric   POLYPECTOMY  07/21/2023   Procedure: POLYPECTOMY;  Surgeon: Eartha Angelia Sieving, MD;  [facility]: [facility];  Service: Gastroenterology;;   RECTOCELE REPAIR     x2   RECTOCELE REPAIR N/A 04/04/2017   Procedure: POSTERIOR REPAIR (RECTOCELE);  Surgeon: Edsel Norleen GAILS, MD;  Location: [facility];  Service: Gynecology;  Laterality: N/A;   SUBMUCOSAL LIFTING INJECTION  07/21/2023   Procedure: SUBMUCOSAL LIFTING INJECTION;  Surgeon: Eartha Angelia Sieving, MD;  Location: [facility];  Service: Gastroenterology;;   TOTAL ABDOMINAL HYSTERECTOMY      Family History  Problem Relation Age of Onset   Cancer Mother        breast    Hypertension Mother    Hyperlipidemia Mother    Depression Mother  Anxiety disorder Mother    COPD Mother    Arthritis Mother        rheumatoid   Drug abuse Sister    Coronary artery disease Paternal Grandfather    Coronary artery disease Paternal Uncle    Depression Cousin    Drug abuse Cousin     Social History   Socioeconomic History   Marital status: Married    Spouse name: Christopher   Number of children: 0   Years of education: HS   Highest education level: Not on file  Occupational History   Occupation: unemployed    Comment: pending disability  Tobacco Use   Smoking status: Former    Current packs/day: 0.00    Average packs/day: 1 pack/day for 19.0 years (19.0 ttl pk-yrs)    Types: Cigarettes    Start date: 09/02/1978    Quit date: 09/01/1997    Years since quitting: 26.7    Passive exposure: Past   Smokeless tobacco: Never   Tobacco comments:    Quit smoking 1999 , previous 20 pack years  Vaping Use   Vaping status: Never Used   Substance and Sexual Activity   Alcohol use: Yes    Comment: 1 drink every other week   Drug use: No   Sexual activity: Not Currently    Partners: Male    Birth control/protection: Surgical    Comment: hyst   Other Topics Concern   Not on file  Social History Narrative   Currently unable to work   Lives in [place]   Married   Patient drinks 1 cup of caffeine daily.   Patient is right handed.    [facility]ined the [employer] to get more exercise   Social Drivers of Health   Financial Resource Strain: Low Risk  (04/22/2024)   Overall Financial Resource Strain (CARDIA)    Difficulty of Paying Living Expenses: Not hard at all  Food Insecurity: No Food Insecurity (04/22/2024)   Hunger Vital Sign    Worried About Running Out of Food in the Last Year: Never true    Ran Out of Food in the Last Year: Never true  Transportation Needs: No Transportation Needs (04/22/2024)   PRAPARE - Administrator, Civil Service (Medical): No    Lack of Transportation (Non-Medical): No  Physical Activity: Sufficiently Active (04/22/2024)   Exercise Vital Sign    Days of Exercise per Week: 7 days    Minutes of Exercise per Session: 30 min  Stress: No Stress Concern Present (04/22/2024)   [employer] of Occupational Health - Occupational Stress Questionnaire    Feeling of Stress: Only a little  Social Connections: Moderately Isolated (04/22/2024)   Social Connection and Isolation Panel    Frequency of Communication with Friends and Family: Twice a week    Frequency of Social Gatherings with Friends and Family: Once a week    Attends Religious Services: Never    Database Administrator or Organizations: No    Attends Banker Meetings: Never    Marital Status: Married  Catering Manager Violence: Not At Risk (04/22/2024)   Humiliation, Afraid, Rape, and Kick questionnaire    Fear of Current or Ex-Partner: No    Emotionally Abused: No    Physically Abused: No    Sexually Abused: No     Outpatient Medications Prior to Visit  Medication Sig Dispense Refill   acetaminophen  (TYLENOL  8 HOUR) 650 MG CR tablet Take 650 mg by mouth every 8 (eight)  hours.     ALPRAZolam  (XANAX ) 1 MG tablet Take 1 tablet (1 mg total) by mouth 3 (three) times daily. 90 tablet 2   atorvastatin  (LIPITOR) 40 MG tablet TAKE ONE TABLET BY MOUTH ONCE DAILY. 90 tablet 3   Blood Glucose Monitoring Suppl DEVI 1 each by Does not apply route in the morning, at noon, and at bedtime. May substitute to any manufacturer covered by patient's insurance. 1 each 0   Calcium  500-125 MG-UNIT TABS Take 1 tablet by mouth daily with supper.     Carboxymethylcellulose Sod PF 0.5 % SOLN Place 1 drop into both eyes daily as needed (dry eyes).     clotrimazole  (LOTRIMIN  AF) 1 % cream Apply 1 Application topically 2 (two) times daily. 30 g 0   diclofenac  Sodium (VOLTAREN ) 1 % GEL Apply 1 application topically daily as needed (pain).     estradiol  (ESTRACE ) 0.1 MG/GM vaginal cream Use 0.5 gm in vagina at bedtime 2 x weekly 42.5 g 1   ezetimibe  (ZETIA ) 10 MG tablet Take 1 tablet (10 mg total) by mouth daily. 90 tablet 3   famotidine (PEPCID) 20 MG tablet Take 20 mg by mouth 2 (two) times daily. As needed     lidocaine -prilocaine  (EMLA ) cream Apply 1 Application topically at bedtime.     lisinopril  (ZESTRIL ) 20 MG tablet Take 1 tablet (20 mg total) by mouth daily. 90 tablet 1   methocarbamol  (ROBAXIN ) 500 MG tablet Take 1 tablet (500 mg total) by mouth every 8 (eight) hours as needed (pain, muscle spasms). 50 tablet 0   methylphenidate  (CONCERTA ) 36 MG PO CR tablet Take 1 tablet (36 mg total) by mouth every morning. 30 tablet 0   methylphenidate  (RITALIN ) 10 MG tablet Take 1 tablet (10 mg total) by mouth 2 (two) times daily with breakfast and lunch. 60 tablet 0   methylphenidate  (RITALIN ) 10 MG tablet Take 1 tablet (10 mg total) by mouth 2 (two) times daily with breakfast and lunch. 60 tablet 0   mirabegron  ER (MYRBETRIQ ) 50 MG  TB24 tablet Take 1 tablet (50 mg total) by mouth daily. 30 tablet 5   montelukast  (SINGULAIR ) 10 MG tablet Take 1 tablet (10 mg total) by mouth at bedtime. 30 tablet 5   Multiple Vitamin (MULITIVITAMIN WITH MINERALS) TABS Take 1 tablet by mouth daily with breakfast.     nystatin  (MYCOSTATIN /NYSTOP ) powder Apply 1 Application topically 3 (three) times daily. 60 g 1   Nystatin  POWD by Does not apply route. As needed.     Olopatadine HCl 0.2 % SOLN Place 1 drop into both eyes in the morning and at bedtime.     Omega-3 Fatty Acids (FISH OIL ) 1000 MG CAPS Take 2,000 mg by mouth.     omeprazole  (PRILOSEC) 40 MG capsule Take 1 capsule (40 mg total) by mouth 2 (two) times daily. 60 capsule 0   OVER THE COUNTER MEDICATION Milk thistle once per day.  Mushrooms once daily.  Vit K 2 once per day.  Beet root gummies daily     prednisoLONE  acetate (PRED FORTE ) 1 % ophthalmic suspension Put 6 drops in the rinse bottle and flush half on each side. 15 mL 5   pregabalin  (LYRICA ) 25 MG capsule TAKE ONE CAPSULE BY MOUTH EVERY DAY 30 capsule 3   Probiotic Product (TRUBIOTICS PO) Take 1 capsule by mouth daily. Takes 25 cfu daily.     pyridOXINE (VITAMIN B-6) 100 MG tablet Take 100 mg by mouth daily.  rifaximin  (XIFAXAN ) 550 MG TABS tablet Take 1 tablet (550 mg total) by mouth 3 (three) times daily. 42 tablet 0   traZODone  (DESYREL ) 100 MG tablet TAKE (2) TABLETS BY MOUTH AT BEDTIME. 60 tablet 2   TYMLOS 3120 MCG/1.56ML SOPN One qhs     Ubrogepant  (UBRELVY ) 50 MG TABS Take 1 tablet (50 mg total) by mouth as needed. May repeat x 1 tab after 2 hours; max 2 tabs per day or 8 per month 8 tablet 6   venlafaxine  XR (EFFEXOR  XR) 150 MG 24 hr capsule Take 1 capsule (150 mg total) by mouth daily with breakfast. 90 capsule 2   amoxicillin -clavulanate (AUGMENTIN ) 875-125 MG tablet Take 1 tablet by mouth 2 (two) times daily. 14 tablet 0   No facility-administered medications prior to visit.    Allergies  Allergen  Reactions   Morphine And Codeine Hives   Promethazine Hcl Other (See Comments)    Causes patient to become Hyper    ROS Review of Systems  Constitutional:  Positive for fatigue. Negative for chills and fever.  HENT:  Negative for congestion, sinus pressure and sinus pain.   Eyes:  Negative for pain and discharge.  Respiratory:  Negative for cough and shortness of breath.   Cardiovascular:  Negative for chest pain and palpitations.  Gastrointestinal:  Positive for constipation. Negative for diarrhea, nausea and vomiting.  Endocrine: Negative for polydipsia and polyuria.  Genitourinary:  Negative for dysuria and hematuria.  Musculoskeletal:  Positive for arthralgias (Left wrist) and back pain. Negative for neck stiffness.  Skin:  Negative for rash.  Allergic/Immunologic: Positive for environmental allergies.  Neurological:  Negative for dizziness and weakness.  Psychiatric/Behavioral:  Positive for dysphoric mood. Negative for agitation and behavioral problems. The patient is nervous/anxious.       Objective:    Physical Exam Vitals reviewed.  Constitutional:      General: [name] is not in acute distress.    Appearance: [name] is not diaphoretic.  HENT:     Head: Normocephalic and atraumatic.     Nose: Nose normal. No congestion.     Mouth/Throat:     Mouth: Mucous membranes are dry.     Pharynx: No posterior oropharyngeal erythema.  Eyes:     General: No scleral icterus.    Extraocular Movements: Extraocular movements intact.  Cardiovascular:     Rate and Rhythm: Normal rate and regular rhythm.     Heart sounds: Normal heart sounds. No murmur heard. Pulmonary:     Breath sounds: Normal breath sounds. No wheezing or rales.  Musculoskeletal:     Left wrist: Swelling and tenderness present. Decreased range of motion.     Cervical back: Neck supple. No tenderness.     Lumbar back: Tenderness present.     Comments: RLE cam walker boot in place  Skin:    General: Skin is warm.      Findings: No rash.  Neurological:     General: No focal deficit present.     Mental Status: [name] is alert and oriented to person, place, and time.     Comments: Facial droop, chronic  Psychiatric:        Mood and Affect: Mood normal.        Behavior: Behavior normal.     BP 118/77   Pulse 79   Ht 5' 3 (1.6 m)   Wt 146 lb (66.2 kg)   SpO2 96%   BMI 25.86 kg/m  Wt Readings from Last 3 Encounters:  06/03/24 146 lb (66.2 kg)  05/15/24 151 lb (68.5 kg)  04/22/24 148 lb 12.8 oz (67.5 kg)    Lab Results  Component Value Date   TSH 1.100 12/22/2023   Lab Results  Component Value Date   WBC 10.4 05/24/2024   HGB 14.6 05/24/2024   HCT 44.2 05/24/2024   MCV 102 (H) 05/24/2024   PLT 305 05/24/2024   Lab Results  Component Value Date   NA 140 05/24/2024   K 4.2 05/24/2024   CO2 23 05/24/2024   GLUCOSE 120 (H) 05/24/2024   BUN 15 05/24/2024   CREATININE 1.11 (H) 05/24/2024   BILITOT 0.8 05/24/2024   ALKPHOS 66 05/24/2024   AST 23 05/24/2024   ALT 28 05/24/2024   PROT 7.5 05/24/2024   ALBUMIN 5.2 (H) 05/24/2024   CALCIUM  9.9 05/24/2024   ANIONGAP 8 08/25/2021   EGFR 57 (L) 05/24/2024   Lab Results  Component Value Date   CHOL 124 12/22/2023   Lab Results  Component Value Date   HDL 51 12/22/2023   Lab Results  Component Value Date   LDLCALC 47 12/22/2023   Lab Results  Component Value Date   TRIG 152 (H) 12/22/2023   Lab Results  Component Value Date   CHOLHDL 2.4 12/22/2023   Lab Results  Component Value Date   HGBA1C 5.4 05/24/2024      Assessment & Plan:   Problem List Items Addressed This Visit   None     No orders of the defined types were placed in this encounter.   Follow-up: No follow-ups on file.    Suzzane MARLA Blanch, MD

## 2024-06-03 NOTE — Assessment & Plan Note (Signed)
 Takes Miralax for constipation Had diarrhea with Amitiza Completed Xifaxan for IBS and intestinal methanogenic overgrowth Followed by GI

## 2024-06-03 NOTE — Assessment & Plan Note (Signed)
 BP Readings from Last 1 Encounters:  06/03/24 118/77   Well-controlled with lisinopril  20 mg QD Counseled for compliance with the medications Advised DASH diet and moderate exercise/walking, at least 150 mins/week

## 2024-06-03 NOTE — Patient Instructions (Signed)
 Please continue to take medications as prescribed.  Please continue to follow FODMAP diet and perform moderate exercise/walking as tolerated.

## 2024-06-03 NOTE — Assessment & Plan Note (Signed)
 Had a mechanical fall at home in 10/23, L2 compression fracture Had a lumbar back brace, followed by orthopedic surgery Pain improved, but has intermittent pain - RUQ area pain likely referred pain, which has improved with Lyrica

## 2024-06-03 NOTE — Assessment & Plan Note (Signed)
 Last BMP showed GFR of 57, stable Needs to maintain adequate hydration Avoid nephrotoxic agents including NSAIDs, Dced Nabumetone and Diclofenac  On Lisinopril 

## 2024-06-03 NOTE — Assessment & Plan Note (Signed)
 On Atorvastatin  40 mg QD and Zetia  10 mg QD due to h/o CVA Triglyceride 152, which is overall reasonable, explained to the patient Lipid profile reviewed -LDL at goal now

## 2024-06-04 ENCOUNTER — Ambulatory Visit (INDEPENDENT_AMBULATORY_CARE_PROVIDER_SITE_OTHER): Admitting: Otolaryngology

## 2024-06-06 ENCOUNTER — Ambulatory Visit: Admitting: Orthopedic Surgery

## 2024-06-06 ENCOUNTER — Encounter: Payer: Self-pay | Admitting: Orthopedic Surgery

## 2024-06-06 DIAGNOSIS — M6701 Short Achilles tendon (acquired), right ankle: Secondary | ICD-10-CM

## 2024-06-06 DIAGNOSIS — R7303 Prediabetes: Secondary | ICD-10-CM | POA: Insufficient documentation

## 2024-06-06 DIAGNOSIS — M79671 Pain in right foot: Secondary | ICD-10-CM

## 2024-06-06 NOTE — Assessment & Plan Note (Signed)
 Lab Results  Component Value Date   HGBA1C 5.4 05/24/2024   HbA1c has improved now Continue to follow low-carb diet

## 2024-06-06 NOTE — Progress Notes (Signed)
 Office Visit Note   Patient: [name]           Date of Birth: 1960/11/12           MRN: [id number] Visit Date: 06/06/2024              Requested by: [name], MD [address] [place],  [place] [address] PCP: [name], MD  Chief Complaint  Patient presents with   Right Ankle - Follow-up      HPI: Discussed the use of AI scribe software for clinical note transcription with the patient, who gave verbal consent to proceed.  History of Present Illness [name] is a 63 year old who presents with sharp pains in the toes following a healed right ankle fracture.  They experience sharp pains in the toes, particularly in the big toe, following a healed Weber B fibular fracture of the right ankle that occurred earlier this year. The pain is described as shooting out from the toes and worsens in cold weather, causing a throbbing sensation.  A neurologist suggested the pain might be due to nerve damage from the ankle fracture, although no diagnosis of neuropathy has been made. The pain is not associated with numbness.  They are currently using orthotics and have been taking Tylenol  for pain relief. No other medications have been prescribed for this issue.  Their past medical history includes receiving injections for bursitis in the knees and hips.     Assessment & Plan: Visit Diagnoses:  1. Achilles tendon contracture, right   2. Pain in right foot     Plan: Assessment and Plan Assessment & Plan Right Achilles contracture with forefoot pain secondary to prior healed nondisplaced Weber B fibular fracture Healed nondisplaced Weber B fibular fracture with associated right Achilles contracture causing forefoot pain. Pain likely due to overloading from Achilles contracture secondary to the fibular fracture. No neuropathy or need for medication or surgery identified. - Recommended Achilles stretching exercises: balance on the ball of the foot on stairs,  hold railing, push heels to the floor, feel pull to the knee, perform for one minute, five times a day. - Follow up as needed.      Follow-Up Instructions: Return if symptoms worsen or fail to improve.   Ortho Exam  Patient is alert, oriented, no adenopathy, well-dressed, normal affect, normal respiratory effort. Physical Exam CARDIOVASCULAR: Biphasic dorsalis pedis and posterior tibial pulse without arterial insufficiency. EXTREMITIES: No varicose veins. MUSCULOSKELETAL: Achilles contracture with knee extended. Dorsiflexion just short and neutral.      Imaging: No results found. No images are attached to the encounter.  Labs: Lab Results  Component Value Date   HGBA1C 5.4 05/24/2024   HGBA1C 5.5 07/31/2023   HGBA1C 5.7 (H) 02/10/2023   ESRSEDRATE 2 02/13/2015   LABORGA Multiple bacterial morphotypes present, none 06/24/2015   LABORGA predominant. Suggest appropriate recollection if 06/24/2015   LABORGA clinically indicated. 06/24/2015     Lab Results  Component Value Date   ALBUMIN 5.2 (H) 05/24/2024   ALBUMIN 4.6 12/22/2023   ALBUMIN 4.7 07/31/2023    No results found for: MG Lab Results  Component Value Date   VD25OH 52.4 12/22/2023   VD25OH 86.6 02/10/2023   VD25OH 77.5 08/25/2022    No results found for: PREALBUMIN    Latest Ref Rng & Units 05/24/2024    8:39 AM 12/22/2023    8:15 AM 07/31/2023    2:40 PM  CBC EXTENDED  WBC 3.4 - 10.8 x10E3/uL 10.4  6.3  6.5   RBC 3.77 - 5.28 x10E6/uL 4.33  4.09  3.94   Hemoglobin 11.1 - 15.9 g/dL 85.3  86.4  86.6   HCT 34.0 - 46.6 % 44.2  41.8  39.3   Platelets 150 - 450 x10E3/uL 305  294  251   NEUT# 1.4 - 7.0 x10E3/uL 8.4  4.0  3.5   Lymph# 0.7 - 3.1 x10E3/uL 1.6  1.8  2.4      There is no height or weight on file to calculate BMI.  Orders:  No orders of the defined types were placed in this encounter.  No orders of the defined types were placed in this encounter.    Procedures: No procedures  performed  Clinical Data: No additional findings.  ROS:  All other systems negative, except as noted in the HPI. Review of Systems  Objective: Vital Signs: There were no vitals taken for this visit.  Specialty Comments:  No specialty comments available.  PMFS History: Patient Active Problem List   Diagnosis Date Noted   Prediabetes 06/06/2024   Bloating 02/22/2024   Idiopathic peripheral neuropathy 01/12/2024   Closed fracture of distal end of right fibula with routine healing 01/01/2024   Sprain of left wrist 08/04/2023   Small intestinal bacterial overgrowth (SIBO) 12/08/2022   Chronic fatigue 11/09/2022   Family history of breast cancer 11/08/2022   Osteoporosis with current pathological fracture 08/29/2022   Intestinal methanogen overgrowth 04/28/2022   History of total abdominal hysterectomy 03/28/2022   Dystrophia unguium 02/01/2022   Lumbar compression fracture (HCC) 12/23/2021   Lumbar spondylosis 11/04/2021   Encounter for general adult medical examination with abnormal findings 07/07/2021   Gastric polyp 05/31/2021   NASH (nonalcoholic steatohepatitis) 04/01/2021   Dysphagia 04/01/2021   Stage 3a chronic kidney disease (HCC) 03/16/2021   Arthritis 01/31/2021   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   History of hemorrhagic stroke with residual hemiplegia (HCC) 06/15/2020   Urinary incontinence 06/15/2020   Asthma due to seasonal allergies 06/15/2020   Vaginal itching 03/16/2020   S/P right knee arthroscopy 07/13/17 07/20/2017   Derangement of posterior horn of medial meniscus of right knee    Chondromalacia, patella, right    Irritable bowel syndrome with constipation and diarrhea 05/11/2015   Gait instability 01/06/2015   DDD (degenerative disc disease), lumbar 02/18/2014   Postmenopausal atrophic vaginitis 11/01/2013   Lipoma of abdominal wall 08/14/2013   Paresthesia of foot 02/12/2013   Hypertension    Recurrent depression 11/17/2011   Insomnia 11/17/2011    Mixed hyperlipidemia 09/20/2011   Anxiety 09/20/2011   Past Medical History:  Diagnosis Date   Allergy    grass, dust , mold   Anxiety    Arthritis    Asthma due to seasonal allergies 06/15/2020   Bipolar disorder (HCC)    Carpal tunnel syndrome    Bilateral   Chest pain 09/2011   Cardiac cath-normal coronaries   Constipation    Depression    Difficulty urinating 05/31/2013   Elevated LFTs 12/16/2013   Encounter for general adult medical examination with abnormal findings 07/07/2021   GERD (gastroesophageal reflux disease)    History of kidney stones    Hyperlipemia    Hyperlipidemia    Hypertension    Mild; provoked by stress and anxiety   IBS (irritable bowel syndrome)    Intracerebral bleed (HCC)    No aneurysm; followed by Dr. Alix Brisker replaced    lense transplant  2022; pt states lense don't dilate or constrict   Loss of weight 01/06/2015   Osteoporosis    Stage 3a chronic kidney disease (HCC) 03/16/2021   Stroke (HCC) 1999   hemorrhagic stroke; weakness of left side    Family History  Problem Relation Age of Onset   Cancer Mother        breast    Hypertension Mother    Hyperlipidemia Mother    Depression Mother    Anxiety disorder Mother    COPD Mother    Arthritis Mother        rheumatoid   Drug abuse Sister    Coronary artery disease Paternal Grandfather    Coronary artery disease Paternal Uncle    Depression Cousin    Drug abuse Cousin     Past Surgical History:  Procedure Laterality Date   BIOPSY  04/16/2021   Procedure: BIOPSY;  Surgeon: Eartha Angelia Sieving, MD;  Location: [facility];  Service: Gastroenterology;;  small, bowel, esophageal(proximal and distal);   BIOPSY  07/21/2023   Procedure: BIOPSY;  Surgeon: Eartha Angelia, Sieving, MD;  Location: [facility];  Service: Gastroenterology;;   BRAIN SURGERY  1999   to remove blood clot after stroke    CARDIAC CATHETERIZATION  2016   CERVICAL FUSION     CHOLECYSTECTOMY N/A  10/14/2014   Procedure: LAPAROSCOPIC CHOLECYSTECTOMY WITH INTRAOPERATIVE CHOLANGIOGRAM;  Surgeon: Krystal Russell, MD;  Location: [facility] OR;  Service: General;  Laterality: N/A;   CHONDROPLASTY Right 07/13/2017   Procedure: CHONDROPLASTY of patella;  Surgeon: Margrette Taft BRAVO, MD;  Location: [facility];  Service: Orthopedics;  Laterality: Right;   ESOPHAGEAL DILATION N/A 04/16/2021   Procedure: ESOPHAGEAL DILATION;  Surgeon: Eartha Angelia Sieving, MD;  Location: [facility];  Service: Gastroenterology;  Laterality: N/A;   ESOPHAGOGASTRODUODENOSCOPY (EGD) WITH PROPOFOL  N/A 04/16/2021   Procedure: ESOPHAGOGASTRODUODENOSCOPY (EGD) WITH PROPOFOL ;  Surgeon: Eartha Angelia Sieving, MD;  Location: [facility];  Service: Gastroenterology;  Laterality: N/A;  1:35, pt knows to arrive at 9:45   ESOPHAGOGASTRODUODENOSCOPY (EGD) WITH PROPOFOL  N/A 07/21/2023   Procedure: ESOPHAGOGASTRODUODENOSCOPY (EGD) WITH PROPOFOL ;  Surgeon: Eartha Angelia Sieving, MD;  Location: [facility];  Service: Gastroenterology;  Laterality: N/A;  9:15AM;ASA 1-2   EUS N/A 08/21/2015   Procedure: ESOPHAGEAL ENDOSCOPIC ULTRASOUND (EUS) RADIAL;  Surgeon: Belvie Just, MD;  Location: [facility];  Service: Endoscopy;  Laterality: N/A;   KNEE ARTHROSCOPY WITH MEDIAL MENISECTOMY Right 07/13/2017   Procedure: KNEE ARTHROSCOPY WITH PARTIAL MEDIAL MENISECTOMY;  Surgeon: Margrette Taft BRAVO, MD;  Location: [facility];  Service: Orthopedics;  Laterality: Right;   LEFT HEART CATHETERIZATION WITH CORONARY ANGIOGRAM N/A 09/23/2011   Procedure: LEFT HEART CATHETERIZATION WITH CORONARY ANGIOGRAM;  Surgeon: Aleene JINNY Passe, MD;  Location[facility];  Service: Cardiovascular;  Laterality: N/A;   LIPOMA EXCISION Left 11/18/2013   Procedure: EXCISION OF SOFT TISSUE MASS-LEFT THIGH;  Surgeon: Oneil DELENA Budge, MD;  Location: [facility];  Service: General;  Laterality: Left;   NASAL SEPTOPLASTY W/ TURBINOPLASTY Bilateral 08/30/2021   Procedure: NASAL  SEPTOPLASTY WITH BILATERAL TURBINATE REDUCTION;  Surgeon: Karis Clunes, MD;  Location: [facility];  Service: ENT;  Laterality: Bilateral;   POLYPECTOMY  04/16/2021   Procedure: POLYPECTOMY;  Surgeon: Eartha Angelia Sieving, MD;  Location: [facility];  Service: Gastroenterology;;  gastric   POLYPECTOMY  07/21/2023   Procedure: POLYPECTOMY;  Surgeon: Eartha Angelia Sieving, MD;  Location: [facility];  Service: Gastroenterology;;   RECTOCELE REPAIR  x2   RECTOCELE REPAIR N/A 04/04/2017   Procedure: POSTERIOR REPAIR (RECTOCELE);  Surgeon: Edsel Norleen GAILS, MD;  Location: [facility];  Service: Gynecology;  Laterality: N/A;   SUBMUCOSAL LIFTING INJECTION  07/21/2023   Procedure: SUBMUCOSAL LIFTING INJECTION;  Surgeon: Eartha Flavors, Toribio, MD;  Location: [facility];  Service: Gastroenterology;;   TOTAL ABDOMINAL HYSTERECTOMY     Social History   Occupational History   Occupation: unemployed    Comment: pending disability  Tobacco Use   Smoking status: Former    Current packs/day: 0.00    Average packs/day: 1 pack/day for 19.0 years (19.0 ttl pk-yrs)    Types: Cigarettes    Start date: 09/02/1978    Quit date: 09/01/1997    Years since quitting: 26.7    Passive exposure: Past   Smokeless tobacco: Never   Tobacco comments:    Quit smoking 1999 , previous 20 pack years  Vaping Use   Vaping status: Never Used  Substance and Sexual Activity   Alcohol use: Yes    Comment: 1 drink every other week   Drug use: No   Sexual activity: Not Currently    Partners: Male    Birth control/protection: Surgical    Comment: hyst

## 2024-06-06 NOTE — Assessment & Plan Note (Signed)
 Chronic low back pain Previous MRI of lumbar spine showed lumbar spondylosis Takes Tylenol  and Flexeril  as needed Has tried Voltaren  gel Advised to avoid heavy lifting and frequent bending Needs to continue Lyrica  for neuropathic symptoms

## 2024-06-06 NOTE — Assessment & Plan Note (Signed)
 Physical exam as documented. Fasting blood tests reviewed today.

## 2024-06-07 ENCOUNTER — Ambulatory Visit (HOSPITAL_COMMUNITY)
Admission: RE | Admit: 2024-06-07 | Discharge: 2024-06-07 | Disposition: A | Source: Ambulatory Visit | Attending: Sports Medicine | Admitting: Sports Medicine

## 2024-06-07 ENCOUNTER — Other Ambulatory Visit: Payer: Self-pay | Admitting: Orthopedic Surgery

## 2024-06-07 DIAGNOSIS — M810 Age-related osteoporosis without current pathological fracture: Secondary | ICD-10-CM

## 2024-06-07 DIAGNOSIS — Z780 Asymptomatic menopausal state: Secondary | ICD-10-CM | POA: Diagnosis not present

## 2024-06-14 ENCOUNTER — Other Ambulatory Visit: Payer: Self-pay | Admitting: Internal Medicine

## 2024-06-14 DIAGNOSIS — M47816 Spondylosis without myelopathy or radiculopathy, lumbar region: Secondary | ICD-10-CM

## 2024-06-14 DIAGNOSIS — S32020S Wedge compression fracture of second lumbar vertebra, sequela: Secondary | ICD-10-CM

## 2024-06-18 ENCOUNTER — Encounter: Payer: Self-pay | Admitting: Orthopedic Surgery

## 2024-06-18 ENCOUNTER — Encounter: Payer: Self-pay | Admitting: Gastroenterology

## 2024-06-18 ENCOUNTER — Ambulatory Visit: Admitting: Gastroenterology

## 2024-06-18 VITALS — BP 122/77 | HR 85 | Temp 98.2°F | Ht 63.0 in | Wt 144.4 lb

## 2024-06-18 DIAGNOSIS — K582 Mixed irritable bowel syndrome: Secondary | ICD-10-CM

## 2024-06-18 DIAGNOSIS — R140 Abdominal distension (gaseous): Secondary | ICD-10-CM

## 2024-06-18 DIAGNOSIS — K638219 Small intestinal bacterial overgrowth, unspecified: Secondary | ICD-10-CM

## 2024-06-18 DIAGNOSIS — Z8719 Personal history of other diseases of the digestive system: Secondary | ICD-10-CM | POA: Diagnosis not present

## 2024-06-18 DIAGNOSIS — R1310 Dysphagia, unspecified: Secondary | ICD-10-CM

## 2024-06-18 DIAGNOSIS — K219 Gastro-esophageal reflux disease without esophagitis: Secondary | ICD-10-CM | POA: Diagnosis not present

## 2024-06-18 DIAGNOSIS — R1319 Other dysphagia: Secondary | ICD-10-CM

## 2024-06-18 NOTE — Progress Notes (Signed)
 GI Office Note    Referring Provider: [name], MD Primary Care Physician:  [name], MD Primary Gastroenterologist: Toribio Rubins, MD  (Dr. Cindie will take over)  Date:  06/18/2024  ID:  [name], DOB 1961/04/07, MRN [id number]   Chief Complaint   Chief Complaint  Patient presents with   New Patient (Initial Visit)   History of Present Illness  [name] is a 63 y.o. female with a history of NASH, CKD stage III, bipolar disorder, cervical cancer, depression, HLD, HTN, IBS, stroke, SIBO and SIBO presenting today to discuss new care providers.   Colonoscopy: 08/2018 -performed by Dr. Belvie Just.  Per report within normal limits, had biopsies of the right and left colon which were within normal limits.  Last EUS 2017:  - The pancreatic parenchyma was normal as well as the PD caliber. The PD was very small and difficult to measure. The CBD was normal in size (4.3 mm) and there was no evidence of any stones. No gallbladder was visualized, which is expected in her post cholecystectomy state. No other abnormalities were identified.  EGD January 2025: - Normal esophagus.  - A single gastric polyp. Resected and retrieved via EMR.  - A single gastric polyp. Resected and retrieved.  - Normal stomach othwerwise. Biopsied.  - Normal examined duodenum.  - Pathology showed recurrent fundic gland polyp with low-grade dysplasia which was resected.  - Recommended repeat EGD in 1 year.  Per review of her chart she has required previous courses of Xifaxan  for recurrent SIBO.  She also has a history of constipation reports she has previously tried Investment Banker, Corporate daily and MiraLAX .  Last office visit 02/22/2024.  She reported since 8/9 that she has had recurrent episodes of bloating in the right upper quadrant and some lower abdominal pain.  Occasionally having episodes of diarrhea for which she takes Imodium or Pepto but this is occasional.  Husband was in the room  with her to help try to explain her prior testing for SIBO and the recurrence that is possible.  She was complaining of intermittent choking episodes for the last 10 years and was referred to [facility] for an EGD with Endoflip which she never proceeded with but was interested in having it checked at that visit.  She denied nausea, vomiting, chills, hematochezia, melena, hematemesis, diarrhea, jaundice, pruritus, or weight loss.  Repeat course of Xifaxan  ordered for 2 weeks but if symptoms persist then would recommend checking for SIMO.  Having recurrent issues with dysphagia concern regarding achalasia versus EGJOO, referred back to [facility] for Endoflip testing.  04/25/2024 Dr. Eartha recommended to perform the breath test to rule out SIMO and advise she can take Gas-X.  Schedule follow-up appointment to discuss any further questions.  She is concerned about the gas given she cannot find a particular food that causes it.  She was reporting pain in the lower part of her belly around her bellybutton and was also taking some shots in her belly and wondered if that could be causing her pain.  She has an appointment for consultation with Dr. Gwynneth at [facility] on 08/22/2024 for consideration of EGD with Endoflip for further evaluation of EGJ versus achalasia given dysphagia.  Today:  Discussed the use of AI scribe software for clinical note transcription with the patient, who gave verbal consent to proceed.  She has a history of small intestinal bacterial overgrowth (SIBO), currently controlled following a course of Xifaxan  completed about a month and  a half ago. She has regular bowel movements with occasional 'mushy' stools, manages constipation with stool softeners, and uses Gas-X as needed for gas relief. Three breath tests at [facility] confirmed SIBO. She experienced significant gas and reports trying to stick to a low FODMAP diet, though she is not certain about all aspects of it. She uses Benefiber  daily and occasionally supplements with Miralax . She avoids foods such as broccoli and beans because they cause her gas.  She has ongoing issues with swallowing, previously evaluated, and experiences occasional hiccups more so now which is most bothersome. Does not have any severe dysphagia at this point.   She experiences occasional abdominal pain, described as random and sometimes related to eating, and uses heat packs for relief. No rectal bleeding, black stools, or significant abdominal pain recently.  She has a history of osteoporosis and osteoarthritis, for which she completed a two-year course of injections near her belly button and recently started an annual infusion treatment. She also has stage one kidney disease and is mindful of her fluid intake, though she admits to not drinking enough water  regularly.  She takes medication for incontinence, which has improved her symptoms. She is on disability and expresses concerns about the cost of medications.      Wt Readings from Last 6 Encounters:  06/18/24 144 lb 6.4 oz (65.5 kg)  06/03/24 146 lb (66.2 kg)  05/15/24 151 lb (68.5 kg)  04/22/24 148 lb 12.8 oz (67.5 kg)  04/03/24 153 lb (69.4 kg)  03/11/24 153 lb (69.4 kg)    Body mass index is 25.58 kg/m.   Current Outpatient Medications  Medication Sig Dispense Refill   acetaminophen  (TYLENOL  8 HOUR) 650 MG CR tablet Take 650 mg by mouth every 8 (eight) hours.     ALPRAZolam  (XANAX ) 1 MG tablet Take 1 tablet (1 mg total) by mouth 3 (three) times daily. 90 tablet 2   atorvastatin  (LIPITOR) 40 MG tablet TAKE ONE TABLET BY MOUTH ONCE DAILY. 90 tablet 3   Blood Glucose Monitoring Suppl DEVI 1 each by Does not apply route in the morning, at noon, and at bedtime. May substitute to any manufacturer covered by patient's insurance. 1 each 0   Calcium  500-125 MG-UNIT TABS Take 1 tablet by mouth daily with supper.     Carboxymethylcellulose Sod PF 0.5 % SOLN Place 1 drop into both eyes daily  as needed (dry eyes).     clotrimazole  (LOTRIMIN  AF) 1 % cream Apply 1 Application topically 2 (two) times daily. 30 g 0   diclofenac  Sodium (VOLTAREN ) 1 % GEL Apply 1 application topically daily as needed (pain).     estradiol  (ESTRACE ) 0.1 MG/GM vaginal cream Use 0.5 gm in vagina at bedtime 2 x weekly 42.5 g 1   ezetimibe  (ZETIA ) 10 MG tablet Take 1 tablet (10 mg total) by mouth daily. 90 tablet 3   famotidine (PEPCID) 20 MG tablet Take 20 mg by mouth 2 (two) times daily. As needed     lidocaine -prilocaine  (EMLA ) cream Apply 1 Application topically at bedtime.     lisinopril  (ZESTRIL ) 20 MG tablet Take 1 tablet (20 mg total) by mouth daily. 90 tablet 1   methocarbamol  (ROBAXIN ) 500 MG tablet Take 1 tablet (500 mg total) by mouth every 8 (eight) hours as needed (pain, muscle spasms). 50 tablet 0   methylphenidate  (CONCERTA ) 36 MG PO CR tablet Take 1 tablet (36 mg total) by mouth every morning. 30 tablet 0   methylphenidate  (RITALIN ) 10 MG  tablet Take 1 tablet (10 mg total) by mouth 2 (two) times daily with breakfast and lunch. 60 tablet 0   mirabegron  ER (MYRBETRIQ ) 50 MG TB24 tablet Take 1 tablet (50 mg total) by mouth daily. 30 tablet 5   montelukast  (SINGULAIR ) 10 MG tablet Take 1 tablet (10 mg total) by mouth at bedtime. 30 tablet 5   Multiple Vitamin (MULITIVITAMIN WITH MINERALS) TABS Take 1 tablet by mouth daily with breakfast.     nystatin  (MYCOSTATIN /NYSTOP ) powder Apply 1 Application topically 3 (three) times daily. 60 g 1   Nystatin  POWD by Does not apply route. As needed.     Olopatadine HCl 0.2 % SOLN Place 1 drop into both eyes in the morning and at bedtime.     Omega-3 Fatty Acids (FISH OIL ) 1000 MG CAPS Take 2,000 mg by mouth.     omeprazole  (PRILOSEC) 40 MG capsule Take 1 capsule (40 mg total) by mouth 2 (two) times daily. 60 capsule 0   OVER THE COUNTER MEDICATION Milk thistle once per day.  Mushrooms once daily.  Vit K 2 once per day.  Beet root gummies daily      prednisoLONE  acetate (PRED FORTE ) 1 % ophthalmic suspension Put 6 drops in the rinse bottle and flush half on each side. 15 mL 5   pregabalin  (LYRICA ) 25 MG capsule Take 1 capsule (25 mg total) by mouth daily. 30 capsule 5   Probiotic Product (TRUBIOTICS PO) Take 1 capsule by mouth daily. Takes 25 cfu daily.     pyridOXINE (VITAMIN B-6) 100 MG tablet Take 100 mg by mouth daily.     rifaximin  (XIFAXAN ) 550 MG TABS tablet Take 1 tablet (550 mg total) by mouth 3 (three) times daily. 42 tablet 0   traZODone  (DESYREL ) 100 MG tablet TAKE (2) TABLETS BY MOUTH AT BEDTIME. 60 tablet 2   TYMLOS 3120 MCG/1.56ML SOPN One qhs     Ubrogepant  (UBRELVY ) 50 MG TABS Take 1 tablet (50 mg total) by mouth as needed. May repeat x 1 tab after 2 hours; max 2 tabs per day or 8 per month 8 tablet 6   venlafaxine  XR (EFFEXOR  XR) 150 MG 24 hr capsule Take 1 capsule (150 mg total) by mouth daily with breakfast. 90 capsule 2   methylphenidate  (RITALIN ) 10 MG tablet Take 1 tablet (10 mg total) by mouth 2 (two) times daily with breakfast and lunch. 60 tablet 0   No current facility-administered medications for this visit.    Past Medical History:  Diagnosis Date   Allergy    grass, dust , mold   Anxiety    Arthritis    Asthma due to seasonal allergies 06/15/2020   Bipolar disorder (HCC)    Carpal tunnel syndrome    Bilateral   Chest pain 09/2011   Cardiac cath-normal coronaries   Constipation    Depression    Difficulty urinating 05/31/2013   Elevated LFTs 12/16/2013   Encounter for general adult medical examination with abnormal findings 07/07/2021   GERD (gastroesophageal reflux disease)    History of kidney stones    Hyperlipemia    Hyperlipidemia    Hypertension    Mild; provoked by stress and anxiety   IBS (irritable bowel syndrome)    Intracerebral bleed (HCC)    No aneurysm; followed by Dr. Alix Brisker replaced    lense transplant 2022; pt states lense don't dilate or constrict   Loss of  weight 01/06/2015   Osteoporosis    Stage  3a chronic kidney disease (HCC) 03/16/2021   Stroke (HCC) 1999   hemorrhagic stroke; weakness of left side    Past Surgical History:  Procedure Laterality Date   BIOPSY  04/16/2021   Procedure: BIOPSY;  Surgeon: Eartha Angelia Sieving, MD;  [facility]: [facility];  Service: Gastroenterology;;  small, bowel, esophageal(proximal and distal);   BIOPSY  07/21/2023   Procedure: BIOPSY;  Surgeon: Eartha Angelia, Sieving, MD;  [facility]: [facility];  Service: Gastroenterology;;   BRAIN SURGERY  1999   to remove blood clot after stroke    CARDIAC CATHETERIZATION  2016   CERVICAL FUSION     CHOLECYSTECTOMY N/A 10/14/2014   Procedure: LAPAROSCOPIC CHOLECYSTECTOMY WITH INTRAOPERATIVE CHOLANGIOGRAM;  Surgeon: Krystal Russell, MD;  Location[facility] OR;  Service: General;  Laterality: N/A;   CHONDROPLASTY Right 07/13/2017   Procedure: CHONDROPLASTY of patella;  Surgeon: Margrette Taft BRAVO, MD;  Location: [facility];  Service: Orthopedics;  Laterality: Right;   ESOPHAGEAL DILATION N/A 04/16/2021   Procedure: ESOPHAGEAL DILATION;  Surgeon: Eartha Angelia Sieving, MD;  [facility]: [facility];  Service: Gastroenterology;  Laterality: N/A;   ESOPHAGOGASTRODUODENOSCOPY (EGD) WITH PROPOFOL  N/A 04/16/2021   Procedure: ESOPHAGOGASTRODUODENOSCOPY (EGD) WITH PROPOFOL ;  Surgeon: Eartha Angelia Sieving, MD;  [facility]: [facility];  Service: Gastroenterology;  Laterality: N/A;  1:35, pt knows to arrive at 9:45   ESOPHAGOGASTRODUODENOSCOPY (EGD) WITH PROPOFOL  N/A 07/21/2023   Procedure: ESOPHAGOGASTRODUODENOSCOPY (EGD) WITH PROPOFOL ;  Surgeon: Eartha Angelia Sieving, MD;  [facility]: [facility];  Service: Gastroenterology;  Laterality: N/A;  9:15AM;ASA 1-2   EUS N/A 08/21/2015   Procedure: ESOPHAGEAL ENDOSCOPIC ULTRASOUND (EUS) RADIAL;  Surgeon: Belvie Just, MD;  Location: [facility];  Service: Endoscopy;  Laterality: N/A;   KNEE ARTHROSCOPY WITH MEDIAL  MENISECTOMY Right 07/13/2017   Procedure: KNEE ARTHROSCOPY WITH PARTIAL MEDIAL MENISECTOMY;  Surgeon: Margrette Taft BRAVO, MD;  Location: [facility];  Service: Orthopedics;  Laterality: Right;   LEFT HEART CATHETERIZATION WITH CORONARY ANGIOGRAM N/A 09/23/2011   Procedure: LEFT HEART CATHETERIZATION WITH CORONARY ANGIOGRAM;  Surgeon: Aleene JINNY Passe, MD;  Location: [facility] CATH LAB;  Service: Cardiovascular;  Laterality: N/A;   LIPOMA EXCISION Left 11/18/2013   Procedure: EXCISION OF SOFT TISSUE MASS-LEFT THIGH;  Surgeon: Oneil DELENA Budge, MD;  Location: [facility];  Service: General;  Laterality: Left;   NASAL SEPTOPLASTY W/ TURBINOPLASTY Bilateral 08/30/2021   Procedure: NASAL SEPTOPLASTY WITH BILATERAL TURBINATE REDUCTION;  Surgeon: Karis Clunes, MD;  Location: [facility];  Service: ENT;  Laterality: Bilateral;   POLYPECTOMY  04/16/2021   Procedure: POLYPECTOMY;  Surgeon: Eartha Angelia Sieving, MD;  [facility];  Service: Gastroenterology;;  gastric   POLYPECTOMY  07/21/2023   Procedure: POLYPECTOMY;  Surgeon: Eartha Angelia Sieving, MD;  [facility];  Service: Gastroenterology;;   RECTOCELE REPAIR     x2   RECTOCELE REPAIR N/A 04/04/2017   Procedure: POSTERIOR REPAIR (RECTOCELE);  Surgeon: Edsel Norleen GAILS, MD;  Location: [facility];  Service: Gynecology;  Laterality: N/A;   SUBMUCOSAL LIFTING INJECTION  07/21/2023   Procedure: SUBMUCOSAL LIFTING INJECTION;  Surgeon: Eartha Angelia Sieving, MD;  [facility];  Service: Gastroenterology;;   TOTAL ABDOMINAL HYSTERECTOMY      Family History  Problem Relation Age of Onset   Cancer Mother        breast    Hypertension Mother    Hyperlipidemia Mother    Depression Mother    Anxiety disorder Mother    COPD Mother  Arthritis Mother        rheumatoid   Drug abuse Sister    Coronary artery disease Paternal Grandfather    Coronary artery disease Paternal Uncle    Depression Cousin    Drug abuse Cousin      Allergies as of 06/18/2024 - Review Complete 06/18/2024  Allergen Reaction Noted   Morphine and codeine Hives 09/20/2011   Promethazine hcl Other (See Comments) 09/20/2011    Social History   Socioeconomic History   Marital status: Married    Spouse name: Christopher   Number of children: 0   Years of education: HS   Highest education level: Not on file  Occupational History   Occupation: unemployed    Comment: pending disability  Tobacco Use   Smoking status: Former    Current packs/day: 0.00    Average packs/day: 1 pack/day for 19.0 years (19.0 ttl pk-yrs)    Types: Cigarettes    Start date: 09/02/1978    Quit date: 09/01/1997    Years since quitting: 26.8    Passive exposure: Past   Smokeless tobacco: Never   Tobacco comments:    Quit smoking 1999 , previous 20 pack years  Vaping Use   Vaping status: Never Used  Substance and Sexual Activity   Alcohol use: Yes    Comment: 1 drink every other week   Drug use: No   Sexual activity: Not Currently    Partners: Male    Birth control/protection: Surgical    Comment: hyst   Other Topics Concern   Not on file  Social History Narrative   Currently unable to work   Lives in [place]   Married   Patient drinks 1 cup of caffeine daily.   Patient is right handed.    Joined the [employer] to get more exercise   Social Drivers of Health   Tobacco Use: Medium Risk (06/18/2024)   Patient History    Smoking Tobacco Use: Former    Smokeless Tobacco Use: Never    Passive Exposure: Past  Physicist, Medical Strain: Low Risk (04/22/2024)   Overall Financial Resource Strain (CARDIA)    Difficulty of Paying Living Expenses: Not hard at all  Food Insecurity: No Food Insecurity (04/22/2024)   Epic    Worried About Programme Researcher, Broadcasting/film/video in the Last Year: Never true    Ran Out of Food in the Last Year: Never true  Transportation Needs: No Transportation Needs (04/22/2024)   Epic    Lack of Transportation (Medical): No    Lack of  Transportation (Non-Medical): No  Physical Activity: Sufficiently Active (04/22/2024)   Exercise Vital Sign    Days of Exercise per Week: 7 days    Minutes of Exercise per Session: 30 min  Stress: No Stress Concern Present (04/22/2024)   [employer] of Occupational Health - Occupational Stress Questionnaire    Feeling of Stress: Only a little  Social Connections: Moderately Isolated (04/22/2024)   Social Connection and Isolation Panel    Frequency of Communication with Friends and Family: Twice a week    Frequency of Social Gatherings with Friends and Family: Once a week    Attends Religious Services: Never    Database Administrator or Organizations: No    Attends Banker Meetings: Never    Marital Status: Married  Depression (PHQ2-9): Low Risk (04/22/2024)   Depression (PHQ2-9)    PHQ-2 Score: 0  Alcohol Screen: Low Risk (04/22/2024)   Alcohol Screen    Last  Alcohol Screening Score (AUDIT): 0  Housing: Low Risk (04/22/2024)   Epic    Unable to Pay for Housing in the Last Year: No    Number of Times Moved in the Last Year: 0    Homeless in the Last Year: No  Utilities: Not At Risk (04/22/2024)   Epic    Threatened with loss of utilities: No  Health Literacy: Adequate Health Literacy (04/22/2024)   B1300 Health Literacy    Frequency of need for help with medical instructions: Never    Review of Systems   Gen: Denies fever, chills, anorexia. Denies fatigue, weakness, weight loss.  CV: Denies chest pain, palpitations, syncope, peripheral edema, and claudication. Resp: Denies dyspnea at rest, cough, wheezing, coughing up blood, and pleurisy. GI: See HPI Derm: Denies rash, itching, dry skin Psych: + anxiety. Denies depression, memory loss, confusion. No homicidal or suicidal ideation.  Heme: Denies bruising, bleeding, and enlarged lymph nodes.  Physical Exam   BP 122/77 (BP Location: Right Arm, Patient Position: Sitting, Cuff Size: Normal)   Pulse 85    Temp 98.2 F (36.8 C) (Temporal)   Ht 5' 3 (1.6 m)   Wt 144 lb 6.4 oz (65.5 kg)   BMI 25.58 kg/m   General:   Alert and oriented. No distress noted. Pleasant and cooperative.  Head:  Normocephalic and atraumatic. Eyes:  Conjuctiva clear without scleral icterus. Mouth:  Oral mucosa pink and moist. Good dentition. No lesions. Abdomen:  +BS, soft, non-distended. Mild ttp around umbilicus. No rebound or guarding. No HSM or masses noted. Rectal: deferred Msk:  Symmetrical without gross deformities. Normal posture. Extremities:  Without edema. Neurologic:  Alert and  oriented x4 Psych:  Alert and cooperative. Normal mood and affect.  Assessment & Plan  [name] is a 63 y.o. female presenting today for follow up of SIBO and chronic GI conditions and establish with new GI provider.     Small intestinal bacterial overgrowth (SIBO) SIBO is well-controlled with no recent symptoms of diarrhea or constipation. Previous treatment with Xifaxan  was effective. Discussed potential for recurrence and the use of home breath tests to detect SIBO and SIMO. Consideration of combination antibiotic therapy if SIMO is present. - Continue current management without Xifaxan  as symptoms are controlled. - Provided dietary information on low fermentable foods and FODMAP diet. - Will consider home breath test if symptoms recur to assess for SIBO and SIMO. - Will consider combination antibiotic therapy with Xifaxan  and neomycin  if SIMO is confirmed.  IBS -  constipation and diarrhea IBS symptoms are well-managed with no recent episodes of diarrhea or significant constipation. Occasional use of stool softeners and Miralax  as needed. Discussed dietary modifications to manage symptoms, including low fermentable diet and FODMAP diet. - Continue current management with fiber and Miralax  as needed. - Provided dietary information on low fermentable foods and FODMAP diet. - Consider referral to a nutritionist or  dietitian for further dietary management if needed.  Dysphagia Intermittent dysphagia with occasional hiccups. Previous referral to [facility] for evaluation with EGD with endoflip to assess for EGJOO or achalasia but she is considering canceling the appointment due to inconvenience and plans to discuss symptoms with new ENT specialist, Dr. Ozell Ada. - Discuss symptoms with Dr. Ozell Ada, ENT specialist, for further evaluation.  GERD Symptoms are well-controlled with no recent episodes of heartburn or regurgitation. Discussed dietary triggers and management strategies. - Continue current management and dietary modifications to avoid triggers. (omeprazole  and famotidine)  History of gastric polyp with low-grade dysplasia Last upper endoscopy January 2025 with evidence of low-grade dysplasia of gastric polyp which was removed with EMR.  1 year surveillance recommended. - Will need to consider follow up EGD in January/February for surveillance of gastric polyp if she does not undergo upper endoscopy with [facility]. - Will recall for upper endoscopy in January and if she decides to cancel practice appointment she will need to call and schedule with us  for s further surveillance of her gastric polyp.  Follow up   Follow up in 6 months.    Charmaine Melia, MSN, FNP-BC, AGACNP-BC [facility]  I have reviewed the note and agree with the APP's assessment as described in this progress note  Toribio Fortune, MD Gastroenterology and Hepatology [facility]

## 2024-06-18 NOTE — Patient Instructions (Addendum)
 Diet considerations need to be made for SIBO and SIMO.   Low FODMAP diet:   Foods to Avoid (High-FODMAP):   Garlic and onions Wheat, rye, and barley (in large amounts) Milk, yogurt, and soft cheeses (lactose) Apples, pears, watermelon, and stone fruits Beans, lentils, and chickpeas Sweeteners like sorbitol, mannitol, and xylitol   Foods to Choose (Low-FODMAP):   Carrots, spinach, zucchini, and bell peppers Bananas, blueberries, grapes, and oranges Lactose-free milk, hard cheeses, and plant-based milks (like almond) Gluten-free grains (rice, oats, quinoa) Chicken, eggs, tofu, and most fish Olive oil and small amounts of maple syrup     Low-Fermentation Eating: A Sustainable Long-Term Strategy Due to the limitations and nutritional concerns associated with indefinite adherence to the Low-FODMAP Diet, Dr. Oneil Millin developed a more sustainable dietary approach: Low-Fermentation Eating (LFE). LFE focuses on reducing fermentable foods while emphasizing protein-rich meals to minimize bacterial overgrowth.   Foods to Avoid:   Beans and legumes (lentils, chickpeas, black beans) Cruciferous vegetables (cabbage, Brussels sprouts, broccoli, cauliflower, kale) Whole grains (whole wheat breads, multigrain breads, oats) Dairy products with lactose (milk, yogurt, soft cheeses) Certain fruits (apples, pears, bananas) Sweeteners (sucralose/Splenda, sugar alcohols like sorbitol, xylitol) Gums and thickeners (gum Arabic, xanthan gum, carrageenan)   Foods to Choose:   Refined carbohydrates: White bread (sourdough, French, potato bread), white rice Proteins: Beef, chicken, fish, pork, eggs Non-cruciferous vegetables: Peppers, tomatoes, carrots, cucumbers, zucchini, squash, eggplant, peas, mushrooms, potatoes, sweet potatoes Fruits: Most fruits (limit apples, pears, bananas) Dairy: Lactose-free milk, hard cheeses (cheddar, Parmesan) Nuts and seeds: Almonds, walnuts, pecans, macadamia nuts,  sunflower seeds, pumpkin seeds Oils and fats: Olive oil, coconut oil, butter (in moderation)   Meal Timing Recommendations:   Structured meals without frequent snacking At least 4-hour gaps between meals Avoid late-night eating to support gut motility  For now continue fiber supplementation and miralax  as needed.   If you decide not to pursue consultation with [facility] to discuss the upper endoscopy with [facility] then we will need to get you scheduled for an upper endoscopy early next year to take another look at your stomach given you had a stomach polyp removed in January of this year and it had some low-grade dysplasia which means some abnormality in the tissue and it needs to be looked at around about a year timeframe to ensure it has not recurred and if it has to again remove it.  If you firmly decide not to pursue your visit with [facility] please call us  so we can get you in for this.  We will send you a recall letter mid-January to remind you.  We will plan for an office visit in 6 months, sooner if needed.  It was a pleasure to see you today. I want to create trusting relationships with patients. If you receive a survey regarding your visit,  I greatly appreciate you taking time to fill this out on paper or through your MyChart. I value your feedback.  Charmaine Melia, MSN, FNP-BC, AGACNP-BC [facility]

## 2024-06-19 ENCOUNTER — Other Ambulatory Visit (HOSPITAL_COMMUNITY): Payer: Self-pay | Admitting: Sports Medicine

## 2024-06-19 DIAGNOSIS — M810 Age-related osteoporosis without current pathological fracture: Secondary | ICD-10-CM | POA: Insufficient documentation

## 2024-06-20 ENCOUNTER — Encounter (INDEPENDENT_AMBULATORY_CARE_PROVIDER_SITE_OTHER): Payer: Self-pay | Admitting: *Deleted

## 2024-06-21 ENCOUNTER — Other Ambulatory Visit

## 2024-06-24 ENCOUNTER — Ambulatory Visit (INDEPENDENT_AMBULATORY_CARE_PROVIDER_SITE_OTHER): Admitting: Otolaryngology

## 2024-06-24 VITALS — BP 130/84 | HR 76 | Temp 98.7°F | Wt 144.0 lb

## 2024-06-24 DIAGNOSIS — J3489 Other specified disorders of nose and nasal sinuses: Secondary | ICD-10-CM

## 2024-06-24 DIAGNOSIS — J302 Other seasonal allergic rhinitis: Secondary | ICD-10-CM

## 2024-06-24 DIAGNOSIS — Z87891 Personal history of nicotine dependence: Secondary | ICD-10-CM

## 2024-06-24 DIAGNOSIS — H9313 Tinnitus, bilateral: Secondary | ICD-10-CM

## 2024-06-24 DIAGNOSIS — R0982 Postnasal drip: Secondary | ICD-10-CM

## 2024-06-24 DIAGNOSIS — R0981 Nasal congestion: Secondary | ICD-10-CM

## 2024-06-24 DIAGNOSIS — F458 Other somatoform disorders: Secondary | ICD-10-CM | POA: Diagnosis not present

## 2024-06-24 DIAGNOSIS — J3089 Other allergic rhinitis: Secondary | ICD-10-CM

## 2024-06-24 DIAGNOSIS — R519 Headache, unspecified: Secondary | ICD-10-CM

## 2024-06-24 MED ORDER — IPRATROPIUM BROMIDE 0.06 % NA SOLN: 2.0000 | Freq: Two times a day (BID) | NASAL | 12 refills | Status: AC | PRN

## 2024-06-24 NOTE — Progress Notes (Addendum)
 Dear Dr. Tobie, Here is my assessment for our mutual patient, [name]. Thank you for allowing me the opportunity to care for your patient. Please do not hesitate to contact me should you have any other questions. Sincerely, Dr. Eldora Tobie  Otolaryngology Clinic Note  HISTORY: [name] is a 63 y.o. female kindly referred by Dr. Tobie for evaluation of headaches, PND and rhinitis, and congestion.  Initial visit (04/2024): Patient reports her main complaint is congestion and headaches which are chronic and going since about 2023 She has undergone septo/turbs with Dr. Karis in 2023 which provided mild benefit but some congestion persists. In addition, she also reports chronic headaches which she describes as frequent and going over frontal and posterior head.   She has seen Dr. Iva for allergy testing with multiple positives. She denies any CRS sx or frequent sinus infection sx including discolored drainage, max/ethmoid pressure, hyposmia. She was on saline rinses, and has tried flonase , allegra, singulair , atrovent .   Atrovent  used to help but not currently. She is currently on allegra and flonase .  She has also tried pain medication which temporarily helps with headache No light sensitivity or sound sensitivity. Has not seen Neuro for this. Abx do not help.  Allergy testing has been done No recent CT --------------------------------------------------------- 06/24/2024 Returns for follow up. Headaches are significantly better after mouth guard. She feels like she has not been congested as much. She does not remember trying atrovent . Is having some vasomotor rhinitis symptoms. Did not use pred forte  or migraine medication because of cost. Still using allegra and flonase  (PRN). Using rinses couple of times per week. Concern for migraine but given improvement, she did not take any migraine meds; MRI ordered but not done by Neuro  Patient denies: ear pain, fullness, vertigo,  drainage. Does have new problem of non-pulsatile bilateral tinnitus.  Patient additionally denies: deep pain in ear canal, eustachian tube symptoms such as popping, crackling, sensitive to pressure changes Patient also denies barotrauma, vestibular suppressant use, ototoxic medication use Prior ear surgery: no  GLP-1: no AP/AC: no  Tobacco: prior, quit  H/o Craniotomy with prior noted encephalomalacia  RADIOGRAPHIC EVALUATION AND INDEPENDENT REVIEW OF OTHER RECORDS:: Dr. Iva notes (03/01/2024): noted cough and SAR and PAR with trial of multiple sprays; rec allegra and flonase ; persistent headaches and congestion, ref to ENT CBC and CMP 6/202/2025: WBC 6.3, Plt 294, Eos 100; BUN/Cr 19/1.16  Past Medical History:  Diagnosis Date   Allergy    grass, dust , mold   Anxiety    Arthritis    Asthma due to seasonal allergies 06/15/2020   Bipolar disorder (HCC)    Carpal tunnel syndrome    Bilateral   Chest pain 09/2011   Cardiac cath-normal coronaries   Constipation    Depression    Difficulty urinating 05/31/2013   Elevated LFTs 12/16/2013   Encounter for general adult medical examination with abnormal findings 07/07/2021   GERD (gastroesophageal reflux disease)    History of kidney stones    Hyperlipemia    Hyperlipidemia    Hypertension    Mild; provoked by stress and anxiety   IBS (irritable bowel syndrome)    Intracerebral bleed (HCC)    No aneurysm; followed by Dr. Alix Brisker replaced    lense transplant 2022; pt states lense don't dilate or constrict   Loss of weight 01/06/2015   Osteoporosis    Stage 3a chronic kidney disease (HCC) 03/16/2021   Stroke (HCC) 1999   hemorrhagic  stroke; weakness of left side   Past Surgical History:  Procedure Laterality Date   BIOPSY  04/16/2021   Procedure: BIOPSY;  Surgeon: Eartha Angelia Sieving, MD;  [facility];  Service: Gastroenterology;;  small, bowel, esophageal(proximal and distal);   BIOPSY   07/21/2023   Procedure: BIOPSY;  Surgeon: Eartha Angelia, Sieving, MD;  [facility]: [facility];  Service: Gastroenterology;;   BRAIN SURGERY  1999   to remove blood clot after stroke    CARDIAC CATHETERIZATION  2016   CERVICAL FUSION     CHOLECYSTECTOMY N/A 10/14/2014   Procedure: LAPAROSCOPIC CHOLECYSTECTOMY WITH INTRAOPERATIVE CHOLANGIOGRAM;  Surgeon: Krystal Russell, MD;  Location: [facility] OR;  Service: General;  Laterality: N/A;   CHONDROPLASTY Right 07/13/2017   Procedure: CHONDROPLASTY of patella;  Surgeon: Margrette Taft BRAVO, MD;  Location: [facility];  Service: Orthopedics;  Laterality: Right;   ESOPHAGEAL DILATION N/A 04/16/2021   Procedure: ESOPHAGEAL DILATION;  Surgeon: Eartha Angelia Sieving, MD;  [facility]: [facility];  Service: Gastroenterology;  Laterality: N/A;   ESOPHAGOGASTRODUODENOSCOPY (EGD) WITH PROPOFOL  N/A 04/16/2021   Procedure: ESOPHAGOGASTRODUODENOSCOPY (EGD) WITH PROPOFOL ;  Surgeon: Eartha Angelia Sieving, MD;  [facility]: [facility];  Service: Gastroenterology;  Laterality: N/A;  1:35, pt knows to arrive at 9:45   ESOPHAGOGASTRODUODENOSCOPY (EGD) WITH PROPOFOL  N/A 07/21/2023   Procedure: ESOPHAGOGASTRODUODENOSCOPY (EGD) WITH PROPOFOL ;  Surgeon: Eartha Angelia Sieving, MD;  [facility];  Service: Gastroenterology;  Laterality: N/A;  9:15AM;ASA 1-2   EUS N/A 08/21/2015   Procedure: ESOPHAGEAL ENDOSCOPIC ULTRASOUND (EUS) RADIAL;  Surgeon: Belvie Just, MD;  Location: [facility];  Service: Endoscopy;  Laterality: N/A;   KNEE ARTHROSCOPY WITH MEDIAL MENISECTOMY Right 07/13/2017   Procedure: KNEE ARTHROSCOPY WITH PARTIAL MEDIAL MENISECTOMY;  Surgeon: Margrette Taft BRAVO, MD;  Location: [facility];  Service: Orthopedics;  Laterality: Right;   LEFT HEART CATHETERIZATION WITH CORONARY ANGIOGRAM N/A 09/23/2011   Procedure: LEFT HEART CATHETERIZATION WITH CORONARY ANGIOGRAM;  Surgeon: Aleene JINNY Passe, MD;  Location: [facility] C[facility];  Service: Cardiovascular;   Laterality: N/A;   LIPOMA EXCISION Left 11/18/2013   Procedure: EXCISION OF SOFT TISSUE MASS-LEFT THIGH;  Surgeon: Oneil DELENA Budge, MD;  Location: [facility];  Service: General;  Laterality: Left;   NASAL SEPTOPLASTY W/ TURBINOPLASTY Bilateral 08/30/2021   Procedure: NASAL SEPTOPLASTY WITH BILATERAL TURBINATE REDUCTION;  Surgeon: Karis Clunes, MD;  Location: [facility];  Service: ENT;  Laterality: Bilateral;   POLYPECTOMY  04/16/2021   Procedure: POLYPECTOMY;  Surgeon: Eartha Angelia Sieving, MD;  [facility];  Service: Gastroenterology;;  gastric   POLYPECTOMY  07/21/2023   Procedure: POLYPECTOMY;  Surgeon: Eartha Angelia Sieving, MD;  [facility];  Service: Gastroenterology;;   RECTOCELE REPAIR     x2   RECTOCELE REPAIR N/A 04/04/2017   Procedure: POSTERIOR REPAIR (RECTOCELE);  Surgeon: Edsel Norleen GAILS, MD;  Location: [facility];  Service: Gynecology;  Laterality: N/A;   SUBMUCOSAL LIFTING INJECTION  07/21/2023   Procedure: SUBMUCOSAL LIFTING INJECTION;  Surgeon: Eartha Angelia Sieving, MD;  [facility];  Service: Gastroenterology;;   TOTAL ABDOMINAL HYSTERECTOMY     Family History  Problem Relation Age of Onset   Cancer Mother        breast    Hypertension Mother    Hyperlipidemia Mother    Depression Mother    Anxiety disorder Mother    COPD Mother    Arthritis Mother        rheumatoid   Drug abuse  Sister    Coronary artery disease Paternal Grandfather    Coronary artery disease Paternal Uncle    Depression Cousin    Drug abuse Cousin    Social History   Tobacco Use   Smoking status: Former    Current packs/day: 0.00    Average packs/day: 1 pack/day for 19.0 years (19.0 ttl pk-yrs)    Types: Cigarettes    Start date: 09/02/1978    Quit date: 09/01/1997    Years since quitting: 26.8    Passive exposure: Past   Smokeless tobacco: Never   Tobacco comments:    Quit smoking 1999 , previous 20 pack years  Substance Use Topics    Alcohol use: Yes    Comment: 1 drink every other week   Allergies  Allergen Reactions   Morphine And Codeine Hives   Promethazine Hcl Other (See Comments)    Causes patient to become Hyper   Current Outpatient Medications  Medication Sig Dispense Refill   acetaminophen  (TYLENOL  8 HOUR) 650 MG CR tablet Take 650 mg by mouth every 8 (eight) hours.     ALPRAZolam  (XANAX ) 1 MG tablet Take 1 tablet (1 mg total) by mouth 3 (three) times daily. 90 tablet 2   atorvastatin  (LIPITOR) 40 MG tablet TAKE ONE TABLET BY MOUTH ONCE DAILY. 90 tablet 3   Blood Glucose Monitoring Suppl DEVI 1 each by Does not apply route in the morning, at noon, and at bedtime. May substitute to any manufacturer covered by patient's insurance. 1 each 0   Calcium  500-125 MG-UNIT TABS Take 1 tablet by mouth daily with supper.     Carboxymethylcellulose Sod PF 0.5 % SOLN Place 1 drop into both eyes daily as needed (dry eyes).     clotrimazole  (LOTRIMIN  AF) 1 % cream Apply 1 Application topically 2 (two) times daily. 30 g 0   diclofenac  Sodium (VOLTAREN ) 1 % GEL Apply 1 application topically daily as needed (pain).     estradiol  (ESTRACE ) 0.1 MG/GM vaginal cream Use 0.5 gm in vagina at bedtime 2 x weekly 42.5 g 1   ezetimibe  (ZETIA ) 10 MG tablet Take 1 tablet (10 mg total) by mouth daily. 90 tablet 3   famotidine (PEPCID) 20 MG tablet Take 20 mg by mouth 2 (two) times daily. As needed     ipratropium (ATROVENT ) 0.06 % nasal spray Place 2 sprays into both nostrils 2 (two) times daily as needed. 15 mL 12   lidocaine -prilocaine  (EMLA ) cream Apply 1 Application topically at bedtime.     lisinopril  (ZESTRIL ) 20 MG tablet Take 1 tablet (20 mg total) by mouth daily. 90 tablet 1   methocarbamol  (ROBAXIN ) 500 MG tablet Take 1 tablet (500 mg total) by mouth every 8 (eight) hours as needed (pain, muscle spasms). 50 tablet 0   methylphenidate  (CONCERTA ) 36 MG PO CR tablet Take 1 tablet (36 mg total) by mouth every morning. 30 tablet 0    methylphenidate  (RITALIN ) 10 MG tablet Take 1 tablet (10 mg total) by mouth 2 (two) times daily with breakfast and lunch. 60 tablet 0   methylphenidate  (RITALIN ) 10 MG tablet Take 1 tablet (10 mg total) by mouth 2 (two) times daily with breakfast and lunch. 60 tablet 0   mirabegron  ER (MYRBETRIQ ) 50 MG TB24 tablet Take 1 tablet (50 mg total) by mouth daily. 30 tablet 5   montelukast  (SINGULAIR ) 10 MG tablet Take 1 tablet (10 mg total) by mouth at bedtime. 30 tablet 5   Multiple Vitamin (MULITIVITAMIN WITH MINERALS)  TABS Take 1 tablet by mouth daily with breakfast.     nystatin  (MYCOSTATIN /NYSTOP ) powder Apply 1 Application topically 3 (three) times daily. 60 g 1   Nystatin  POWD by Does not apply route. As needed.     Olopatadine HCl 0.2 % SOLN Place 1 drop into both eyes in the morning and at bedtime.     Omega-3 Fatty Acids (FISH OIL ) 1000 MG CAPS Take 2,000 mg by mouth.     omeprazole  (PRILOSEC) 40 MG capsule Take 1 capsule (40 mg total) by mouth 2 (two) times daily. 60 capsule 0   OVER THE COUNTER MEDICATION Milk thistle once per day.  Mushrooms once daily.  Vit K 2 once per day.  Beet root gummies daily     prednisoLONE  acetate (PRED FORTE ) 1 % ophthalmic suspension Put 6 drops in the rinse bottle and flush half on each side. 15 mL 5   pregabalin  (LYRICA ) 25 MG capsule Take 1 capsule (25 mg total) by mouth daily. 30 capsule 5   Probiotic Product (TRUBIOTICS PO) Take 1 capsule by mouth daily. Takes 25 cfu daily.     pyridOXINE (VITAMIN B-6) 100 MG tablet Take 100 mg by mouth daily.     rifaximin  (XIFAXAN ) 550 MG TABS tablet Take 1 tablet (550 mg total) by mouth 3 (three) times daily. 42 tablet 0   traZODone  (DESYREL ) 100 MG tablet TAKE (2) TABLETS BY MOUTH AT BEDTIME. 60 tablet 2   TYMLOS 3120 MCG/1.56ML SOPN One qhs     Ubrogepant  (UBRELVY ) 50 MG TABS Take 1 tablet (50 mg total) by mouth as needed. May repeat x 1 tab after 2 hours; max 2 tabs per day or 8 per month 8 tablet 6    venlafaxine  XR (EFFEXOR  XR) 150 MG 24 hr capsule Take 1 capsule (150 mg total) by mouth daily with breakfast. 90 capsule 2   No current facility-administered medications for this visit.   BP 130/84 (BP Location: Left Arm, Patient Position: Sitting, Cuff Size: Normal)   Pulse 76   Temp 98.7 F (37.1 C)   Wt 144 lb (65.3 kg)   SpO2 94%   BMI 25.51 kg/m   PHYSICAL EXAM:  BP 130/84 (BP Location: Left Arm, Patient Position: Sitting, Cuff Size: Normal)   Pulse 76   Temp 98.7 F (37.1 C)   Wt 144 lb (65.3 kg)   SpO2 94%   BMI 25.51 kg/m    Salient findings:  CN II-XII intact Bilateral EAC clear and TM intact with well pneumatized middle ear spaces Nose: Anterior rhinoscopy reveals septum relatively midline, bilateral inferior turbinates reduced Mod cottle neg b/l No lesions of oral cavity/oropharynx; modest b/l TMJ crepitus today No obviously palpable neck masses/lymphadenopathy/thyromegaly No respiratory distress or stridor   PROCEDURE (not today, prior):  Prior to initiating any procedures, risks/benefits/alternatives were explained to the patient and verbal consent obtained. Diagnostic Nasal Endoscopy Pre-procedure diagnosis: Nasal congestion, PND Post-procedure diagnosis: same Indication: See pre-procedure diagnosis and physical exam above Complications: None apparent EBL: 0 mL Anesthesia: Lidocaine  4% and topical decongestant was topically sprayed in each nasal cavity  Description of Procedure:  Patient was identified. A rigid 30 degree endoscope was utilized to evaluate the sinonasal cavities, mucosa, sinus ostia and turbinates and septum.  Overall, signs of mucosal inflammation are not noted.  Also noted are septum relatively midline.  No mucopurulence, polyps, or masses noted.   Right Middle meatus: clear Right SE Recess: clear Left MM: clear Left SE Recess: clear  CPT CODE -- 31231 - Mod 25   ASSESSMENT:  63 y.o. with:  1. Nasal congestion   2. Post-nasal  drip   3. Seasonal and perennial allergic rhinitis   4. Chronic nonintractable headache, unspecified headache type   5. Bruxism   6. Rhinorrhea   7. Tinnitus of both ears    Now doing significantly better with resolution of headaches and congestion. Having some VM rhinitis sx, which we will treat Tinnitus most likely 2/2 presbycusis - d/w pt re: audio, def; discussed masking, lipoflavonoids, retraining and she opted for lipoflavonoids  PLAN: We've discussed issues and options today.  The risks, benefits and alternatives were discussed and questions answered.  [name] has elected to proceed with:  Continue PO anthistamine and flonase  Start atrovent  QID PRN for rhinorrhea Migraine mgmt per Neuro F/u if no improvement over next several weeks  See below regarding exact medications prescribed this encounter including dosages and route: Meds ordered this encounter  Medications   ipratropium (ATROVENT ) 0.06 % nasal spray    Sig: Place 2 sprays into both nostrils 2 (two) times daily as needed.    Dispense:  15 mL    Refill:  12     Thank you for allowing me the opportunity to care for your patient. Please do not hesitate to contact me should you have any other questions.  Sincerely, Eldora Blanch, MD Otolaryngologist (ENT), [facility] Phone: [phone] Fax: [phone]  MDM:  Complexity/Problems addressed: mod - multiple chronic problems Data complexity: low - Morbidity: mod  - Prescription Drug prescribed or managed: y     06/26/2024, 10:56 AM

## 2024-06-24 NOTE — Patient Instructions (Signed)
 Lipoflavonoid --- buy it over the counter; for ringing; can use for 4 weeks Use atrovent /ipratropium spray two sprays each nostril up to 4 times per day for runny nose

## 2024-06-26 ENCOUNTER — Encounter (INDEPENDENT_AMBULATORY_CARE_PROVIDER_SITE_OTHER): Payer: Self-pay | Admitting: Otolaryngology

## 2024-07-04 ENCOUNTER — Telehealth: Payer: Self-pay

## 2024-07-04 NOTE — Telephone Encounter (Signed)
 Auth Submission: NO AUTH NEEDED Site of care: Site of care: [facility] Payer: [facility] ppo Medication & CPT/J Code(s) submitted: Reclast (Zolendronic acid) [id number] Diagnosis Code:  Route of submission (phone, fax, portal): phone Phone # Fax # Auth type: Buy/Bill PB Units/visits requested: 5mg  x 1dose Reference number: [id number] Approval from: 07/04/24 to 07/03/25

## 2024-07-08 ENCOUNTER — Other Ambulatory Visit: Payer: Self-pay | Admitting: Internal Medicine

## 2024-07-08 DIAGNOSIS — M1711 Unilateral primary osteoarthritis, right knee: Secondary | ICD-10-CM

## 2024-07-08 DIAGNOSIS — G8929 Other chronic pain: Secondary | ICD-10-CM

## 2024-07-08 DIAGNOSIS — E782 Mixed hyperlipidemia: Secondary | ICD-10-CM

## 2024-07-09 ENCOUNTER — Telehealth (HOSPITAL_COMMUNITY): Admitting: Psychiatry

## 2024-07-09 ENCOUNTER — Encounter (HOSPITAL_COMMUNITY): Payer: Self-pay | Admitting: Psychiatry

## 2024-07-09 DIAGNOSIS — F329 Major depressive disorder, single episode, unspecified: Secondary | ICD-10-CM | POA: Diagnosis not present

## 2024-07-09 DIAGNOSIS — R5382 Chronic fatigue, unspecified: Secondary | ICD-10-CM

## 2024-07-09 DIAGNOSIS — F988 Other specified behavioral and emotional disorders with onset usually occurring in childhood and adolescence: Secondary | ICD-10-CM

## 2024-07-09 DIAGNOSIS — G8929 Other chronic pain: Secondary | ICD-10-CM

## 2024-07-09 DIAGNOSIS — F411 Generalized anxiety disorder: Secondary | ICD-10-CM | POA: Diagnosis not present

## 2024-07-09 DIAGNOSIS — F332 Major depressive disorder, recurrent severe without psychotic features: Secondary | ICD-10-CM

## 2024-07-09 MED ORDER — ALPRAZOLAM 1 MG PO TABS: 1.0000 mg | ORAL_TABLET | Freq: Three times a day (TID) | ORAL | 2 refills | Status: AC

## 2024-07-09 MED ORDER — METHYLPHENIDATE HCL 10 MG PO TABS: 10.0000 mg | ORAL_TABLET | ORAL | 0 refills | Status: AC

## 2024-07-09 MED ORDER — VENLAFAXINE HCL ER 150 MG PO CP24: 300.0000 mg | ORAL_CAPSULE | Freq: Every day | ORAL | 2 refills | Status: AC

## 2024-07-09 MED ORDER — TRAZODONE HCL 100 MG PO TABS: ORAL_TABLET | ORAL | 2 refills | Status: AC

## 2024-07-09 NOTE — Progress Notes (Signed)
 Virtual Visit via Telephone Note  I connected with [name] on 07/09/2024 at 11:20 AM EST by telephone and verified that I am speaking with the correct person using two identifiers.  Location: Patient: home Provider: office   I discussed the limitations, risks, security and privacy concerns of performing an evaluation and management service by telephone and the availability of in person appointments. I also discussed with the patient that there may be a patient responsible charge related to this service. The patient expressed understanding and agreed to proceed.       I discussed the assessment and treatment plan with the patient. The patient was provided an opportunity to ask questions and all were answered. The patient agreed with the plan and demonstrated an understanding of the instructions.   The patient was advised to call back or seek an in-person evaluation if the symptoms worsen or if the condition fails to improve as anticipated.  I provided 20 minutes of non-face-to-face time during this encounter.   Barnie Gull, MD  [facility] MD/PA/NP OP Progress Note  07/09/2024 11:54 AM [name]  MRN:  [id number]  Chief Complaint:  Chief Complaint  Patient presents with   ADD   Anxiety   Depression   Follow-up   HPI: This patient is a 64 year old married white female lives with her husband in [place]. She has no children. 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  She still has numerous somatic complaints and feels hopeless at times regarding her health.  She states at times she does not want to be here but does not have any plan to harm herself.  She and her husband are still very close and do a lot of things together.  However she is frustrated because she is  not driving and cannot do a lot of physical things because of the pain in her knees.  I suggested we go up on her Effexor  XR to help with the mood.  She states that the methylphenidate  twice a day is too much so we will go back to the 10 mg once a day.  The Xanax  continues to help with her anxiety.  She states that she is going to try to find some indoor things that she can enjoy such as crafts or hobbies. Visit Diagnosis:    ICD-10-CM   1. Major depressive disorder, recurrent, severe without psychotic features (HCC)  F33.2     2. ADD (attention deficit disorder) without hyperactivity  F98.8       Past Psychiatric History: Past outpatient treatment for depression  Past Medical History:  Past Medical History:  Diagnosis Date   Allergy    grass, dust , mold   Anxiety    Arthritis    Asthma due to seasonal allergies 06/15/2020   Bipolar disorder (HCC)    Carpal tunnel syndrome    Bilateral   Chest pain 09/2011   Cardiac cath-normal coronaries   Constipation    Depression    Difficulty urinating 05/31/2013   Elevated LFTs 12/16/2013   Encounter for general adult medical examination with abnormal findings 07/07/2021   GERD (gastroesophageal reflux disease)    History of kidney stones    Hyperlipemia    Hyperlipidemia    Hypertension    Mild; provoked by stress  and anxiety   IBS (irritable bowel syndrome)    Intracerebral bleed (HCC)    No aneurysm; followed by Dr. Alix Brisker replaced    lense transplant 2022; pt states lense don't dilate or constrict   Loss of weight 01/06/2015   Osteoporosis    Stage 3a chronic kidney disease (HCC) 03/16/2021   Stroke (HCC) 1999   hemorrhagic stroke; weakness of left side    Past Surgical History:  Procedure Laterality Date   BIOPSY  04/16/2021   Procedure: BIOPSY;  Surgeon: Eartha Angelia Sieving, MD;  [facility];  Service: Gastroenterology;;  small, bowel, esophageal(proximal and distal);   BIOPSY  07/21/2023    Procedure: BIOPSY;  Surgeon: Eartha Angelia, Sieving, MD;  [facility];  Service: Gastroenterology;;   BRAIN SURGERY  1999   to remove blood clot after stroke    CARDIAC CATHETERIZATION  2016   CERVICAL FUSION     CHOLECYSTECTOMY N/A 10/14/2014   Procedure: LAPAROSCOPIC CHOLECYSTECTOMY WITH INTRAOPERATIVE CHOLANGIOGRAM;  Surgeon: Krystal Russell, MD;  Location: [facility] OR;  Service: General;  Laterality: N/A;   CHONDROPLASTY Right 07/13/2017   Procedure: CHONDROPLASTY of patella;  Surgeon: Margrette Taft BRAVO, MD;  Location: [facility];  Service: Orthopedics;  Laterality: Right;   ESOPHAGEAL DILATION N/A 04/16/2021   Procedure: ESOPHAGEAL DILATION;  Surgeon: Eartha Angelia Sieving, MD;  [facility];  Service: Gastroenterology;  Laterality: N/A;   ESOPHAGOGASTRODUODENOSCOPY (EGD) WITH PROPOFOL  N/A 04/16/2021   Procedure: ESOPHAGOGASTRODUODENOSCOPY (EGD) WITH PROPOFOL ;  Surgeon: Eartha Angelia Sieving, MD;  [facility];  Service: Gastroenterology;  Laterality: N/A;  1:35, pt knows to arrive at 9:45   ESOPHAGOGASTRODUODENOSCOPY (EGD) WITH PROPOFOL  N/A 07/21/2023   Procedure: ESOPHAGOGASTRODUODENOSCOPY (EGD) WITH PROPOFOL ;  Surgeon: Eartha Angelia Sieving, MD;  [facility];  Service: Gastroenterology;  Laterality: N/A;  9:15AM;ASA 1-2   EUS N/A 08/21/2015   Procedure: ESOPHAGEAL ENDOSCOPIC ULTRASOUND (EUS) RADIAL;  Surgeon: Belvie Just, MD;  Location: [facility];  Service: Endoscopy;  Laterality: N/A;   KNEE ARTHROSCOPY WITH MEDIAL MENISECTOMY Right 07/13/2017   Procedure: KNEE ARTHROSCOPY WITH PARTIAL MEDIAL MENISECTOMY;  Surgeon: Margrette Taft BRAVO, MD;  Location: [facility];  Service: Orthopedics;  Laterality: Right;   LEFT HEART CATHETERIZATION WITH CORONARY ANGIOGRAM N/A 09/23/2011   Procedure: LEFT HEART CATHETERIZATION WITH CORONARY ANGIOGRAM;  Surgeon: Aleene JINNY Passe, MD;  Location: [facility] CATH LAB;  Service: Cardiovascular;  Laterality: N/A;    LIPOMA EXCISION Left 11/18/2013   Procedure: EXCISION OF SOFT TISSUE MASS-LEFT THIGH;  Surgeon: Oneil DELENA Budge, MD;  Location: [facility];  Service: General;  Laterality: Left;   NASAL SEPTOPLASTY W/ TURBINOPLASTY Bilateral 08/30/2021   Procedure: NASAL SEPTOPLASTY WITH BILATERAL TURBINATE REDUCTION;  Surgeon: Karis Clunes, MD;  Location: [facility];  Service: ENT;  Laterality: Bilateral;   POLYPECTOMY  04/16/2021   Procedure: POLYPECTOMY;  Surgeon: Eartha Angelia Sieving, MD;  [facility];  Service: Gastroenterology;;  gastric   POLYPECTOMY  07/21/2023   Procedure: POLYPECTOMY;  Surgeon: Eartha Angelia Sieving, MD;  [facility];  Service: Gastroenterology;;   RECTOCELE REPAIR     x2   RECTOCELE REPAIR N/A 04/04/2017   Procedure: POSTERIOR REPAIR (RECTOCELE);  Surgeon: Edsel Norleen GAILS, MD;  Location: [facility];  Service: Gynecology;  Laterality: N/A;   SUBMUCOSAL LIFTING INJECTION  07/21/2023   Procedure: SUBMUCOSAL LIFTING INJECTION;  Surgeon: Eartha Angelia Sieving, MD;  [facility];  Service: Gastroenterology;;   TOTAL ABDOMINAL  HYSTERECTOMY      Family Psychiatric History: See below  Family History:  Family History  Problem Relation Age of Onset   Cancer Mother        breast    Hypertension Mother    Hyperlipidemia Mother    Depression Mother    Anxiety disorder Mother    COPD Mother    Arthritis Mother        rheumatoid   Drug abuse Sister    Coronary artery disease Paternal Grandfather    Coronary artery disease Paternal Uncle    Depression Cousin    Drug abuse Cousin     Social History:  Social History   Socioeconomic History   Marital status: Married    Spouse name: Christopher   Number of children: 0   Years of education: HS   Highest education level: Not on file  Occupational History   Occupation: unemployed    Comment: pending disability  Tobacco Use   Smoking status: Former    Current packs/day: 0.00    Average  packs/day: 1 pack/day for 19.0 years (19.0 ttl pk-yrs)    Types: Cigarettes    Start date: 09/02/1978    Quit date: 09/01/1997    Years since quitting: 26.8    Passive exposure: Past   Smokeless tobacco: Never   Tobacco comments:    Quit smoking 1999 , previous 20 pack years  Vaping Use   Vaping status: Never Used  Substance and Sexual Activity   Alcohol use: Yes    Comment: 1 drink every other week   Drug use: No   Sexual activity: Not Currently    Partners: Male    Birth control/protection: Surgical    Comment: hyst   Other Topics Concern   Not on file  Social History Narrative   Currently unable to work   Lives in [place]   Married   Patient drinks 1 cup of caffeine daily.   Patient is right handed.    [employer]ined the [employer] to get more exercise   Social Drivers of Health   Tobacco Use: Medium Risk (06/26/2024)   Patient History    Smoking Tobacco Use: Former    Smokeless Tobacco Use: Never    Passive Exposure: Past  Physicist, Medical Strain: Low Risk (04/22/2024)   Overall Financial Resource Strain (CARDIA)    Difficulty of Paying Living Expenses: Not hard at all  Food Insecurity: No Food Insecurity (04/22/2024)   Epic    Worried About Programme Researcher, Broadcasting/film/video in the Last Year: Never true    Ran Out of Food in the Last Year: Never true  Transportation Needs: No Transportation Needs (04/22/2024)   Epic    Lack of Transportation (Medical): No    Lack of Transportation (Non-Medical): No  Physical Activity: Sufficiently Active (04/22/2024)   Exercise Vital Sign    Days of Exercise per Week: 7 days    Minutes of Exercise per Session: 30 min  Stress: No Stress Concern Present (04/22/2024)   [employer] of Occupational Health - Occupational Stress Questionnaire    Feeling of Stress: Only a little  Social Connections: Moderately Isolated (04/22/2024)   Social Connection and Isolation Panel    Frequency of Communication with Friends and Family: Twice a week    Frequency of  Social Gatherings with Friends and Family: Once a week    Attends Religious Services: Never    Database Administrator or Organizations: No    Attends Banker Meetings:  Never    Marital Status: Married  Depression (PHQ2-9): Low Risk (04/22/2024)   Depression (PHQ2-9)    PHQ-2 Score: 0  Alcohol Screen: Low Risk (04/22/2024)   Alcohol Screen    Last Alcohol Screening Score (AUDIT): 0  Housing: Low Risk (04/22/2024)   Epic    Unable to Pay for Housing in the Last Year: No    Number of Times Moved in the Last Year: 0    Homeless in the Last Year: No  Utilities: Not At Risk (04/22/2024)   Epic    Threatened with loss of utilities: No  Health Literacy: Adequate Health Literacy (04/22/2024)   B1300 Health Literacy    Frequency of need for help with medical instructions: Never    Allergies: Allergies[1]  Metabolic Disorder Labs: Lab Results  Component Value Date   HGBA1C 5.4 05/24/2024   MPG 117 (H) 01/06/2015   MPG 117 (H) 02/17/2014   No results found for: PROLACTIN Lab Results  Component Value Date   CHOL 124 12/22/2023   TRIG 152 (H) 12/22/2023   HDL 51 12/22/2023   CHOLHDL 2.4 12/22/2023   VLDL 26 09/16/2015   LDLCALC 47 12/22/2023   LDLCALC 58 02/10/2023   Lab Results  Component Value Date   TSH 1.100 12/22/2023   TSH 1.410 02/10/2023    Therapeutic Level Labs: No results found for: LITHIUM No results found for: VALPROATE No results found for: CBMZ  Current Medications: Current Outpatient Medications  Medication Sig Dispense Refill   methylphenidate  (RITALIN ) 10 MG tablet Take 1 tablet (10 mg total) by mouth every morning. 30 tablet 0   methylphenidate  (RITALIN ) 10 MG tablet Take 1 tablet (10 mg total) by mouth every morning. 30 tablet 0   acetaminophen  (TYLENOL  8 HOUR) 650 MG CR tablet Take 650 mg by mouth every 8 (eight) hours.     ALPRAZolam  (XANAX ) 1 MG tablet Take 1 tablet (1 mg total) by mouth 3 (three) times daily. 90 tablet 2    atorvastatin  (LIPITOR) 40 MG tablet TAKE ONE TABLET BY MOUTH ONCE DAILY. 90 tablet 3   Blood Glucose Monitoring Suppl DEVI 1 each by Does not apply route in the morning, at noon, and at bedtime. May substitute to any manufacturer covered by patient's insurance. 1 each 0   Calcium  500-125 MG-UNIT TABS Take 1 tablet by mouth daily with supper.     Carboxymethylcellulose Sod PF 0.5 % SOLN Place 1 drop into both eyes daily as needed (dry eyes).     clotrimazole  (LOTRIMIN  AF) 1 % cream Apply 1 Application topically 2 (two) times daily. 30 g 0   diclofenac  Sodium (VOLTAREN ) 1 % GEL Apply 1 application topically daily as needed (pain).     estradiol  (ESTRACE ) 0.1 MG/GM vaginal cream Use 0.5 gm in vagina at bedtime 2 x weekly 42.5 g 1   ezetimibe  (ZETIA ) 10 MG tablet Take 1 tablet (10 mg total) by mouth daily. 90 tablet 3   famotidine (PEPCID) 20 MG tablet Take 20 mg by mouth 2 (two) times daily. As needed     ipratropium (ATROVENT ) 0.06 % nasal spray Place 2 sprays into both nostrils 2 (two) times daily as needed. 15 mL 12   lidocaine -prilocaine  (EMLA ) cream Apply 1 Application topically at bedtime.     lisinopril  (ZESTRIL ) 20 MG tablet Take 1 tablet (20 mg total) by mouth daily. 90 tablet 1   methocarbamol  (ROBAXIN ) 500 MG tablet Take 1 tablet (500 mg total) by mouth every 8 (eight) hours  as needed (pain, muscle spasms). 50 tablet 0   mirabegron  ER (MYRBETRIQ ) 50 MG TB24 tablet Take 1 tablet (50 mg total) by mouth daily. 30 tablet 5   montelukast  (SINGULAIR ) 10 MG tablet Take 1 tablet (10 mg total) by mouth at bedtime. 30 tablet 5   Multiple Vitamin (MULITIVITAMIN WITH MINERALS) TABS Take 1 tablet by mouth daily with breakfast.     nystatin  (MYCOSTATIN /NYSTOP ) powder Apply 1 Application topically 3 (three) times daily. 60 g 1   Nystatin  POWD by Does not apply route. As needed.     Olopatadine HCl 0.2 % SOLN Place 1 drop into both eyes in the morning and at bedtime.     Omega-3 Fatty Acids (FISH OIL )  1000 MG CAPS Take 2,000 mg by mouth.     omeprazole  (PRILOSEC) 40 MG capsule Take 1 capsule (40 mg total) by mouth 2 (two) times daily. 60 capsule 0   OVER THE COUNTER MEDICATION Milk thistle once per day.  Mushrooms once daily.  Vit K 2 once per day.  Beet root gummies daily     prednisoLONE  acetate (PRED FORTE ) 1 % ophthalmic suspension Put 6 drops in the rinse bottle and flush half on each side. 15 mL 5   pregabalin  (LYRICA ) 25 MG capsule Take 1 capsule (25 mg total) by mouth daily. 30 capsule 5   Probiotic Product (TRUBIOTICS PO) Take 1 capsule by mouth daily. Takes 25 cfu daily.     pyridOXINE (VITAMIN B-6) 100 MG tablet Take 100 mg by mouth daily.     rifaximin  (XIFAXAN ) 550 MG TABS tablet Take 1 tablet (550 mg total) by mouth 3 (three) times daily. 42 tablet 0   traMADol -acetaminophen  (ULTRACET ) 37.5-325 MG tablet Take 1 tablet by mouth every 4 (four) hours as needed. 90 tablet 5   traZODone  (DESYREL ) 100 MG tablet TAKE (2) TABLETS BY MOUTH AT BEDTIME. 60 tablet 2   TYMLOS 3120 MCG/1.56ML SOPN One qhs     Ubrogepant  (UBRELVY ) 50 MG TABS Take 1 tablet (50 mg total) by mouth as needed. May repeat x 1 tab after 2 hours; max 2 tabs per day or 8 per month 8 tablet 6   venlafaxine  XR (EFFEXOR  XR) 150 MG 24 hr capsule Take 2 capsules (300 mg total) by mouth daily with breakfast. 180 capsule 2   No current facility-administered medications for this visit.     Musculoskeletal: Strength & Muscle Tone: na Gait & Station: na Patient leans: N/A  Psychiatric Specialty Exam: Review of Systems  Musculoskeletal:  Positive for arthralgias, joint swelling and myalgias.  Psychiatric/Behavioral:  Positive for dysphoric mood.   All other systems reviewed and are negative.   There were no vitals taken for this visit.There is no height or weight on file to calculate BMI.  General Appearance: NA  Eye Contact:  NA  Speech:  Clear and Coherent  Volume:  Normal  Mood: Depressed  Affect:  na   Thought Process:  Goal Directed  Orientation:  Full (Time, Place, and Person)  Thought Content: Rumination   Suicidal Thoughts:  No  Homicidal Thoughts:  No  Memory:  Immediate;   Good Recent;   Good Remote;   NA  Judgement:  Good  Insight:  Fair  Psychomotor Activity:  Decreased  Concentration:  Concentration: Fair and Attention Span: Fair  Recall:  Good  Fund of Knowledge: Good  Language: Good  Akathisia:  No  Handed:  Right  AIMS (if indicated): not done  Assets:  Communication  Skills Desire for Improvement Resilience Social Support  ADL's:  Intact  Cognition: WNL  Sleep:  Good   Screenings: AUDIT    Flowsheet Row Clinical Support from 01/06/2021 in [facility]  Alcohol Use Disorder Identification Test Final Score (AUDIT) 4   GAD-7    Flowsheet Row Office Visit from 01/01/2024 in [facility] Office Visit from 07/31/2023 in [facility] Office Visit from 08/29/2022 in [facility]  Total GAD-7 Score 0 0 6   MDI    Flowsheet Row Office Visit from 01/22/2016 in [facility]  Total Score (max 50) 34   Mini-Mental    Flowsheet Row Office Visit from 02/13/2015 in [facility]  Total Score (max 30 points ) 26   PHQ2-9    Flowsheet Row Clinical Support from 04/22/2024 in [facility] Office Visit from 01/01/2024 in [facility] Office Visit from 07/31/2023 in [facility] Clinical Support from 04/19/2023 in [facility] Office Visit from 02/27/2023 in [facility]  PHQ-2 Total Score 0 0 0 2 0  PHQ-9 Total Score 0 0 0 6 --   SBQ-R    Flowsheet Row Office Visit from 01/22/2016 in [facility] Total Score 14.1   Flowsheet Row ED from 11/25/2023 in [facility] at [facility] ED from 09/20/2023 in [facility] Emergency Department at [facility] Admission (Discharged) from 07/21/2023 in [facility]  C-SSRS RISK CATEGORY No Risk No Risk No Risk     Assessment and Plan: This patient is a 64 year old female with a history of major depression generalized anxiety ADD chronic fatigue and chronic pain.  She thinks the methylphenidate  twice a day is too much so we will go back to 10 mg every morning for ADD.  She will increase Effexor  XR from 150 to 300 mg every morning for major depression continue Xanax  1 mg up to 3 times daily for anxiety and trazodone  200 mg at bedtime for sleep.  She will return to see me in 2 months  Collaboration of Care: Collaboration of Care: Primary Care Provider AEB notes are shared with PCP on the epic system  Patient/Guardian was advised Release of Information must be obtained prior to any record release in order to collaborate their care with an outside provider. Patient/Guardian was advised if they have not already done so to contact the registration department to sign all necessary forms in order for us  to release information regarding their care.   Consent: Patient/Guardian gives verbal consent for treatment and assignment of benefits for services provided during this visit. Patient/Guardian expressed understanding and agreed to proceed.    Barnie Gull, MD 07/09/2024, 11:54 AM     [1]  Allergies Allergen Reactions   Morphine And Codeine Hives   Promethazine Hcl Other (See Comments)    Causes patient to become Hyper

## 2024-07-15 ENCOUNTER — Encounter: Payer: Self-pay | Admitting: *Deleted

## 2024-07-15 ENCOUNTER — Encounter: Attending: Sports Medicine | Admitting: Emergency Medicine

## 2024-07-15 VITALS — BP 116/63 | HR 63 | Temp 97.7°F | Resp 15

## 2024-07-15 DIAGNOSIS — M810 Age-related osteoporosis without current pathological fracture: Secondary | ICD-10-CM

## 2024-07-15 MED ORDER — ACETAMINOPHEN 325 MG PO TABS
650.0000 mg | ORAL_TABLET | Freq: Once | ORAL | Status: AC
  Administered 2024-07-15: 650 mg via ORAL

## 2024-07-15 MED ORDER — DIPHENHYDRAMINE HCL 25 MG PO CAPS
25.0000 mg | ORAL_CAPSULE | Freq: Once | ORAL | Status: AC
  Administered 2024-07-15: 25 mg via ORAL

## 2024-07-15 MED ORDER — SODIUM CHLORIDE 0.9 % IV SOLN: INTRAVENOUS | Status: DC

## 2024-07-15 MED ORDER — ZOLEDRONIC ACID 5 MG/100ML IV SOLN
5.0000 mg | Freq: Once | INTRAVENOUS | Status: AC
  Administered 2024-07-15: 5 mg via INTRAVENOUS

## 2024-07-15 NOTE — Progress Notes (Signed)
 Diagnosis: Osteoporosis  Provider:  Orpha Stabs MD  Procedure: IV Infusion  IV Type: Peripheral, IV Location: L Hand   Reclast  (Zolendronic Acid), Dose: 5 mg  Infusion Start Time: 1440  Infusion Stop Time: 1511  Post Infusion IV Care: Observation period completed and Peripheral IV Discontinued  Discharge: Condition: Good, Destination: Home . AVS Provided  Performed by:  Delon ONEIDA Officer, RN

## 2024-07-29 ENCOUNTER — Ambulatory Visit: Admitting: Orthopedic Surgery

## 2024-07-31 NOTE — Telephone Encounter (Signed)
 LMOM for pt to call office

## 2024-08-06 ENCOUNTER — Telehealth: Payer: Self-pay | Admitting: Gastroenterology

## 2024-08-06 NOTE — Telephone Encounter (Signed)
 Do you mean Dr.Carver?

## 2024-08-07 ENCOUNTER — Encounter: Payer: Self-pay | Admitting: *Deleted

## 2024-08-07 NOTE — Telephone Encounter (Signed)
 Pt has been scheduled for 08/27/24. Instructions sent via mychart.

## 2024-08-08 ENCOUNTER — Ambulatory Visit: Admitting: Orthopedic Surgery

## 2024-08-15 ENCOUNTER — Ambulatory Visit: Admitting: Orthopedic Surgery

## 2024-08-23 ENCOUNTER — Other Ambulatory Visit (HOSPITAL_COMMUNITY)

## 2024-08-27 ENCOUNTER — Encounter (HOSPITAL_COMMUNITY): Payer: Self-pay

## 2024-08-27 ENCOUNTER — Ambulatory Visit (HOSPITAL_COMMUNITY): Admit: 2024-08-27 | Admitting: Internal Medicine

## 2024-09-06 ENCOUNTER — Telehealth (HOSPITAL_COMMUNITY): Admitting: Psychiatry

## 2024-09-25 ENCOUNTER — Ambulatory Visit: Admitting: Allergy & Immunology

## 2024-12-02 ENCOUNTER — Ambulatory Visit: Admitting: Diagnostic Neuroimaging

## 2024-12-17 ENCOUNTER — Ambulatory Visit: Admitting: Gastroenterology

## 2024-12-27 ENCOUNTER — Ambulatory Visit: Admitting: Internal Medicine

## 2025-04-23 ENCOUNTER — Ambulatory Visit

## 2025-07-16 ENCOUNTER — Ambulatory Visit
